# Patient Record
Sex: Female | Born: 1950 | Race: Asian | Hispanic: No | Marital: Married | Smoking: Never smoker
Health system: Southern US, Community
[De-identification: ages and names within clinical notes are randomized; demographics above are authoritative.]

## PROBLEM LIST (undated history)

## (undated) DIAGNOSIS — M75100 Unspecified rotator cuff tear or rupture of unspecified shoulder, not specified as traumatic: Secondary | ICD-10-CM

## (undated) DIAGNOSIS — D721 Eosinophilia: Principal | ICD-10-CM

## (undated) DIAGNOSIS — I5032 Chronic diastolic (congestive) heart failure: Secondary | ICD-10-CM

## (undated) DIAGNOSIS — Z9289 Personal history of other medical treatment: Secondary | ICD-10-CM

## (undated) DIAGNOSIS — I482 Chronic atrial fibrillation, unspecified: Secondary | ICD-10-CM

## (undated) DIAGNOSIS — E669 Obesity, unspecified: Secondary | ICD-10-CM

## (undated) DIAGNOSIS — D649 Anemia, unspecified: Secondary | ICD-10-CM

## (undated) DIAGNOSIS — K644 Residual hemorrhoidal skin tags: Secondary | ICD-10-CM

## (undated) DIAGNOSIS — R5383 Other fatigue: Secondary | ICD-10-CM

## (undated) DIAGNOSIS — I099 Rheumatic heart disease, unspecified: Secondary | ICD-10-CM

## (undated) DIAGNOSIS — R7303 Prediabetes: Secondary | ICD-10-CM

## (undated) DIAGNOSIS — D582 Other hemoglobinopathies: Secondary | ICD-10-CM

## (undated) DIAGNOSIS — J309 Allergic rhinitis, unspecified: Secondary | ICD-10-CM

## (undated) DIAGNOSIS — D560 Alpha thalassemia: Secondary | ICD-10-CM

## (undated) DIAGNOSIS — E785 Hyperlipidemia, unspecified: Secondary | ICD-10-CM

## (undated) DIAGNOSIS — I10 Essential (primary) hypertension: Secondary | ICD-10-CM

## (undated) DIAGNOSIS — G5600 Carpal tunnel syndrome, unspecified upper limb: Secondary | ICD-10-CM

## (undated) HISTORY — DX: Other hemoglobinopathies: D58.2

## (undated) HISTORY — DX: Carpal tunnel syndrome, unspecified upper limb: G56.00

## (undated) HISTORY — DX: Chronic atrial fibrillation, unspecified: I48.20

## (undated) HISTORY — DX: Other fatigue: R53.83

## (undated) HISTORY — DX: Personal history of other medical treatment: Z92.89

## (undated) HISTORY — DX: Alpha thalassemia: D56.0

## (undated) HISTORY — DX: Eosinophilia: D72.1

## (undated) HISTORY — DX: Allergic rhinitis, unspecified: J30.9

## (undated) HISTORY — DX: Anemia, unspecified: D64.9

## (undated) HISTORY — DX: Essential (primary) hypertension: I10

## (undated) HISTORY — DX: Residual hemorrhoidal skin tags: K64.4

## (undated) HISTORY — DX: Obesity, unspecified: E66.9

## (undated) HISTORY — PX: OTHER SURGICAL HISTORY: SHX169

## (undated) HISTORY — DX: Hyperlipidemia, unspecified: E78.5

## (undated) HISTORY — DX: Unspecified rotator cuff tear or rupture of unspecified shoulder, not specified as traumatic: M75.100

## (undated) HISTORY — PX: CARDIAC CATHETERIZATION: SHX172

## (undated) HISTORY — DX: Chronic diastolic (congestive) heart failure: I50.32

## (undated) HISTORY — DX: Prediabetes: R73.03

## (undated) HISTORY — DX: Rheumatic heart disease, unspecified: I09.9

---

## 1998-05-16 ENCOUNTER — Emergency Department (HOSPITAL_COMMUNITY): Admission: EM | Admit: 1998-05-16 | Discharge: 1998-05-16 | Payer: Self-pay | Admitting: Emergency Medicine

## 1998-06-04 ENCOUNTER — Emergency Department (HOSPITAL_COMMUNITY): Admission: EM | Admit: 1998-06-04 | Discharge: 1998-06-04 | Payer: Self-pay | Admitting: Emergency Medicine

## 1998-08-17 ENCOUNTER — Ambulatory Visit (HOSPITAL_COMMUNITY): Admission: RE | Admit: 1998-08-17 | Discharge: 1998-08-17 | Payer: Self-pay | Admitting: Gastroenterology

## 1998-08-17 ENCOUNTER — Encounter: Payer: Self-pay | Admitting: Gastroenterology

## 1998-08-22 ENCOUNTER — Ambulatory Visit (HOSPITAL_COMMUNITY): Admission: RE | Admit: 1998-08-22 | Discharge: 1998-08-22 | Payer: Self-pay | Admitting: Gastroenterology

## 1998-08-22 ENCOUNTER — Encounter: Payer: Self-pay | Admitting: Gastroenterology

## 1998-08-29 ENCOUNTER — Ambulatory Visit (HOSPITAL_COMMUNITY): Admission: RE | Admit: 1998-08-29 | Discharge: 1998-08-29 | Payer: Self-pay | Admitting: Gastroenterology

## 1999-03-14 ENCOUNTER — Encounter: Payer: Self-pay | Admitting: Obstetrics and Gynecology

## 1999-03-14 ENCOUNTER — Ambulatory Visit (HOSPITAL_COMMUNITY): Admission: RE | Admit: 1999-03-14 | Discharge: 1999-03-14 | Payer: Self-pay | Admitting: Obstetrics and Gynecology

## 1999-03-16 ENCOUNTER — Encounter (INDEPENDENT_AMBULATORY_CARE_PROVIDER_SITE_OTHER): Payer: Self-pay

## 1999-03-16 ENCOUNTER — Other Ambulatory Visit: Admission: RE | Admit: 1999-03-16 | Discharge: 1999-03-16 | Payer: Self-pay | Admitting: Obstetrics and Gynecology

## 1999-04-10 ENCOUNTER — Encounter: Payer: Self-pay | Admitting: Endocrinology

## 1999-04-10 ENCOUNTER — Ambulatory Visit (HOSPITAL_COMMUNITY): Admission: RE | Admit: 1999-04-10 | Discharge: 1999-04-10 | Payer: Self-pay | Admitting: Endocrinology

## 1999-10-23 ENCOUNTER — Encounter: Payer: Self-pay | Admitting: Gastroenterology

## 1999-10-23 ENCOUNTER — Ambulatory Visit (HOSPITAL_COMMUNITY): Admission: RE | Admit: 1999-10-23 | Discharge: 1999-10-23 | Payer: Self-pay | Admitting: Gastroenterology

## 2000-04-01 ENCOUNTER — Encounter: Payer: Self-pay | Admitting: Cardiology

## 2000-04-01 ENCOUNTER — Encounter: Admission: RE | Admit: 2000-04-01 | Discharge: 2000-04-01 | Payer: Self-pay | Admitting: Cardiology

## 2000-11-25 ENCOUNTER — Other Ambulatory Visit: Admission: RE | Admit: 2000-11-25 | Discharge: 2000-11-25 | Payer: Self-pay | Admitting: Gynecology

## 2001-05-05 ENCOUNTER — Inpatient Hospital Stay (HOSPITAL_COMMUNITY): Admission: EM | Admit: 2001-05-05 | Discharge: 2001-05-09 | Payer: Self-pay | Admitting: Emergency Medicine

## 2001-05-05 ENCOUNTER — Encounter: Payer: Self-pay | Admitting: Cardiology

## 2001-05-06 ENCOUNTER — Encounter: Payer: Self-pay | Admitting: Cardiology

## 2001-05-07 ENCOUNTER — Encounter: Payer: Self-pay | Admitting: Cardiology

## 2001-05-08 ENCOUNTER — Encounter: Payer: Self-pay | Admitting: Cardiology

## 2001-05-09 ENCOUNTER — Encounter: Payer: Self-pay | Admitting: Cardiology

## 2001-10-02 ENCOUNTER — Encounter: Payer: Self-pay | Admitting: Internal Medicine

## 2001-10-02 ENCOUNTER — Emergency Department (HOSPITAL_COMMUNITY): Admission: EM | Admit: 2001-10-02 | Discharge: 2001-10-02 | Payer: Self-pay

## 2003-01-06 ENCOUNTER — Other Ambulatory Visit: Admission: RE | Admit: 2003-01-06 | Discharge: 2003-01-06 | Payer: Self-pay | Admitting: Gynecology

## 2003-10-25 ENCOUNTER — Emergency Department (HOSPITAL_COMMUNITY): Admission: EM | Admit: 2003-10-25 | Discharge: 2003-10-25 | Payer: Self-pay | Admitting: Emergency Medicine

## 2003-11-08 ENCOUNTER — Encounter: Admission: RE | Admit: 2003-11-08 | Discharge: 2003-11-08 | Payer: Self-pay | Admitting: Cardiology

## 2004-07-27 ENCOUNTER — Other Ambulatory Visit: Admission: RE | Admit: 2004-07-27 | Discharge: 2004-07-27 | Payer: Self-pay | Admitting: Gynecology

## 2004-10-19 ENCOUNTER — Observation Stay (HOSPITAL_COMMUNITY): Admission: EM | Admit: 2004-10-19 | Discharge: 2004-10-20 | Payer: Self-pay | Admitting: Emergency Medicine

## 2004-10-20 ENCOUNTER — Encounter (INDEPENDENT_AMBULATORY_CARE_PROVIDER_SITE_OTHER): Payer: Self-pay | Admitting: *Deleted

## 2004-12-01 ENCOUNTER — Encounter: Admission: RE | Admit: 2004-12-01 | Discharge: 2004-12-01 | Payer: Self-pay | Admitting: Cardiology

## 2004-12-05 ENCOUNTER — Ambulatory Visit (HOSPITAL_COMMUNITY): Admission: RE | Admit: 2004-12-05 | Discharge: 2004-12-05 | Payer: Self-pay | Admitting: Cardiology

## 2005-06-27 ENCOUNTER — Inpatient Hospital Stay (HOSPITAL_COMMUNITY): Admission: EM | Admit: 2005-06-27 | Discharge: 2005-06-29 | Payer: Self-pay | Admitting: Emergency Medicine

## 2006-01-28 ENCOUNTER — Emergency Department (HOSPITAL_COMMUNITY): Admission: EM | Admit: 2006-01-28 | Discharge: 2006-01-28 | Payer: Self-pay | Admitting: Emergency Medicine

## 2006-02-01 ENCOUNTER — Emergency Department (HOSPITAL_COMMUNITY): Admission: EM | Admit: 2006-02-01 | Discharge: 2006-02-01 | Payer: Self-pay | Admitting: Family Medicine

## 2006-02-11 ENCOUNTER — Encounter: Admission: RE | Admit: 2006-02-11 | Discharge: 2006-04-30 | Payer: Self-pay | Admitting: Orthopedic Surgery

## 2006-02-24 ENCOUNTER — Emergency Department (HOSPITAL_COMMUNITY): Admission: EM | Admit: 2006-02-24 | Discharge: 2006-02-24 | Payer: Self-pay | Admitting: Family Medicine

## 2006-05-15 ENCOUNTER — Encounter: Admission: RE | Admit: 2006-05-15 | Discharge: 2006-05-15 | Payer: Self-pay | Admitting: Orthopedic Surgery

## 2006-06-12 ENCOUNTER — Other Ambulatory Visit: Admission: RE | Admit: 2006-06-12 | Discharge: 2006-06-12 | Payer: Self-pay | Admitting: Gynecology

## 2006-06-23 ENCOUNTER — Encounter: Admission: RE | Admit: 2006-06-23 | Discharge: 2006-06-23 | Payer: Self-pay | Admitting: Orthopedic Surgery

## 2006-07-31 ENCOUNTER — Emergency Department (HOSPITAL_COMMUNITY): Admission: EM | Admit: 2006-07-31 | Discharge: 2006-07-31 | Payer: Self-pay | Admitting: Emergency Medicine

## 2007-07-29 ENCOUNTER — Encounter: Admission: RE | Admit: 2007-07-29 | Discharge: 2007-10-27 | Payer: Self-pay | Admitting: Orthopaedic Surgery

## 2008-01-07 ENCOUNTER — Encounter (INDEPENDENT_AMBULATORY_CARE_PROVIDER_SITE_OTHER): Payer: Self-pay | Admitting: Nurse Practitioner

## 2008-01-07 ENCOUNTER — Ambulatory Visit: Payer: Self-pay | Admitting: Internal Medicine

## 2008-01-08 ENCOUNTER — Ambulatory Visit: Payer: Self-pay | Admitting: *Deleted

## 2008-03-17 ENCOUNTER — Ambulatory Visit: Payer: Self-pay | Admitting: Internal Medicine

## 2008-08-24 ENCOUNTER — Encounter: Admission: RE | Admit: 2008-08-24 | Discharge: 2008-08-26 | Payer: Self-pay | Admitting: Orthopedic Surgery

## 2008-09-06 ENCOUNTER — Encounter: Admission: RE | Admit: 2008-09-06 | Discharge: 2008-10-26 | Payer: Self-pay | Admitting: Orthopedic Surgery

## 2008-11-08 ENCOUNTER — Encounter: Admission: RE | Admit: 2008-11-08 | Discharge: 2008-11-08 | Payer: Self-pay | Admitting: Orthopedic Surgery

## 2008-11-11 DIAGNOSIS — G5600 Carpal tunnel syndrome, unspecified upper limb: Secondary | ICD-10-CM

## 2008-12-29 ENCOUNTER — Telehealth (INDEPENDENT_AMBULATORY_CARE_PROVIDER_SITE_OTHER): Payer: Self-pay | Admitting: Internal Medicine

## 2009-01-07 ENCOUNTER — Ambulatory Visit: Payer: Self-pay | Admitting: Internal Medicine

## 2009-01-07 DIAGNOSIS — E785 Hyperlipidemia, unspecified: Secondary | ICD-10-CM

## 2009-01-07 DIAGNOSIS — S335XXA Sprain of ligaments of lumbar spine, initial encounter: Secondary | ICD-10-CM

## 2009-01-07 DIAGNOSIS — E1169 Type 2 diabetes mellitus with other specified complication: Secondary | ICD-10-CM | POA: Insufficient documentation

## 2009-01-07 DIAGNOSIS — K219 Gastro-esophageal reflux disease without esophagitis: Secondary | ICD-10-CM | POA: Insufficient documentation

## 2009-01-18 ENCOUNTER — Encounter (INDEPENDENT_AMBULATORY_CARE_PROVIDER_SITE_OTHER): Payer: Self-pay | Admitting: Internal Medicine

## 2009-06-21 ENCOUNTER — Ambulatory Visit: Payer: Self-pay | Admitting: Internal Medicine

## 2009-06-21 DIAGNOSIS — M719 Bursopathy, unspecified: Secondary | ICD-10-CM

## 2009-06-21 DIAGNOSIS — Z8639 Personal history of other endocrine, nutritional and metabolic disease: Secondary | ICD-10-CM

## 2009-06-21 DIAGNOSIS — Z862 Personal history of diseases of the blood and blood-forming organs and certain disorders involving the immune mechanism: Secondary | ICD-10-CM | POA: Insufficient documentation

## 2009-06-21 DIAGNOSIS — R7309 Other abnormal glucose: Secondary | ICD-10-CM | POA: Insufficient documentation

## 2009-06-21 DIAGNOSIS — M67919 Unspecified disorder of synovium and tendon, unspecified shoulder: Secondary | ICD-10-CM | POA: Insufficient documentation

## 2009-06-22 DIAGNOSIS — R17 Unspecified jaundice: Secondary | ICD-10-CM | POA: Insufficient documentation

## 2009-06-22 LAB — CONVERTED CEMR LAB
ALT: 23 units/L (ref 0–35)
AST: 23 units/L (ref 0–37)
Albumin: 4.8 g/dL (ref 3.5–5.2)
Alkaline Phosphatase: 73 units/L (ref 39–117)
BUN: 19 mg/dL (ref 6–23)
CO2: 25 meq/L (ref 19–32)
Calcium: 9.3 mg/dL (ref 8.4–10.5)
Chloride: 103 meq/L (ref 96–112)
Glucose, Bld: 98 mg/dL (ref 70–99)
Potassium: 3.9 meq/L (ref 3.5–5.3)
Sodium: 142 meq/L (ref 135–145)
Total Protein: 8.2 g/dL (ref 6.0–8.3)

## 2009-06-28 ENCOUNTER — Ambulatory Visit: Payer: Self-pay | Admitting: Internal Medicine

## 2009-06-29 ENCOUNTER — Encounter (INDEPENDENT_AMBULATORY_CARE_PROVIDER_SITE_OTHER): Payer: Self-pay | Admitting: Internal Medicine

## 2009-07-01 ENCOUNTER — Encounter (INDEPENDENT_AMBULATORY_CARE_PROVIDER_SITE_OTHER): Payer: Self-pay | Admitting: Internal Medicine

## 2009-07-07 ENCOUNTER — Encounter (INDEPENDENT_AMBULATORY_CARE_PROVIDER_SITE_OTHER): Payer: Self-pay | Admitting: Internal Medicine

## 2009-08-03 ENCOUNTER — Ambulatory Visit: Payer: Self-pay | Admitting: Internal Medicine

## 2009-08-15 LAB — CONVERTED CEMR LAB
Bilirubin, Direct: 0.3 mg/dL (ref 0.0–0.3)
Indirect Bilirubin: 1.4 mg/dL — ABNORMAL HIGH (ref 0.0–0.9)
Total Bilirubin: 1.7 mg/dL — ABNORMAL HIGH (ref 0.3–1.2)

## 2009-10-06 ENCOUNTER — Encounter: Admission: RE | Admit: 2009-10-06 | Discharge: 2010-01-04 | Payer: Self-pay | Admitting: Internal Medicine

## 2009-10-07 ENCOUNTER — Encounter: Admission: RE | Admit: 2009-10-07 | Discharge: 2009-10-07 | Payer: Self-pay | Admitting: Cardiology

## 2009-10-07 ENCOUNTER — Encounter (INDEPENDENT_AMBULATORY_CARE_PROVIDER_SITE_OTHER): Payer: Self-pay | Admitting: Internal Medicine

## 2009-11-22 ENCOUNTER — Encounter (INDEPENDENT_AMBULATORY_CARE_PROVIDER_SITE_OTHER): Payer: Self-pay | Admitting: Internal Medicine

## 2009-11-29 ENCOUNTER — Ambulatory Visit: Payer: Self-pay | Admitting: Internal Medicine

## 2009-11-29 DIAGNOSIS — I079 Rheumatic tricuspid valve disease, unspecified: Secondary | ICD-10-CM | POA: Insufficient documentation

## 2009-11-29 DIAGNOSIS — M109 Gout, unspecified: Secondary | ICD-10-CM

## 2009-12-09 DIAGNOSIS — R718 Other abnormality of red blood cells: Secondary | ICD-10-CM

## 2009-12-09 LAB — CONVERTED CEMR LAB
ALT: 25 units/L (ref 0–35)
AST: 26 units/L (ref 0–37)
Albumin: 5.2 g/dL (ref 3.5–5.2)
Alkaline Phosphatase: 66 units/L (ref 39–117)
BUN: 21 mg/dL (ref 6–23)
Basophils Absolute: 0.1 10*3/uL (ref 0.0–0.1)
Basophils Relative: 1 % (ref 0–1)
CO2: 24 meq/L (ref 19–32)
Calcium: 9.5 mg/dL (ref 8.4–10.5)
Chloride: 103 meq/L (ref 96–112)
Cholesterol: 166 mg/dL (ref 0–200)
Creatinine, Ser: 0.84 mg/dL (ref 0.40–1.20)
Eosinophils Absolute: 0.2 10*3/uL (ref 0.0–0.7)
Eosinophils Relative: 3 % (ref 0–5)
Glucose, Bld: 100 mg/dL — ABNORMAL HIGH (ref 70–99)
HCT: 39.5 % (ref 36.0–46.0)
HDL: 63 mg/dL (ref >39)
Hemoglobin: 12.3 g/dL (ref 12.0–15.0)
LDL Cholesterol: 80 mg/dL (ref 0–99)
LH: 48.1 milliintl units/mL
Lymphocytes Relative: 39 % (ref 12–46)
Lymphs Abs: 2.6 10*3/uL (ref 0.7–4.0)
MCHC: 31.1 g/dL (ref 30.0–36.0)
MCV: 75.2 fL — ABNORMAL LOW (ref 78.0–100.0)
Monocytes Absolute: 0.5 10*3/uL (ref 0.1–1.0)
Monocytes Relative: 7 % (ref 3–12)
Neutro Abs: 3.4 10*3/uL (ref 1.7–7.7)
Neutrophils Relative %: 51 % (ref 43–77)
Platelets: 161 10*3/uL (ref 150–400)
Potassium: 3.5 meq/L (ref 3.5–5.3)
RBC: 5.25 M/uL — ABNORMAL HIGH (ref 3.87–5.11)
RDW: 14.7 % (ref 11.5–15.5)
Sodium: 142 meq/L (ref 135–145)
Total Bilirubin: 3 mg/dL — ABNORMAL HIGH (ref 0.3–1.2)
Total CHOL/HDL Ratio: 2.6
Total Protein: 8.3 g/dL (ref 6.0–8.3)
Triglycerides: 113 mg/dL (ref <150)
VLDL: 23 mg/dL (ref 0–40)
WBC: 6.6 10*3/uL (ref 4.0–10.5)

## 2009-12-14 ENCOUNTER — Ambulatory Visit: Payer: Self-pay | Admitting: Internal Medicine

## 2010-01-26 ENCOUNTER — Ambulatory Visit: Payer: Self-pay | Admitting: Internal Medicine

## 2010-02-15 LAB — CONVERTED CEMR LAB: Retic Ct Pct: 2.5 % (ref 0.4–3.1)

## 2010-03-04 ENCOUNTER — Encounter (INDEPENDENT_AMBULATORY_CARE_PROVIDER_SITE_OTHER): Payer: Self-pay | Admitting: Internal Medicine

## 2010-03-08 ENCOUNTER — Telehealth (INDEPENDENT_AMBULATORY_CARE_PROVIDER_SITE_OTHER): Payer: Self-pay | Admitting: Internal Medicine

## 2010-03-22 ENCOUNTER — Telehealth (INDEPENDENT_AMBULATORY_CARE_PROVIDER_SITE_OTHER): Payer: Self-pay | Admitting: Internal Medicine

## 2010-03-22 ENCOUNTER — Encounter (INDEPENDENT_AMBULATORY_CARE_PROVIDER_SITE_OTHER): Payer: Self-pay | Admitting: Internal Medicine

## 2010-04-04 ENCOUNTER — Ambulatory Visit: Payer: Self-pay | Admitting: Internal Medicine

## 2010-04-04 LAB — CONVERTED CEMR LAB
Blood Glucose, AC Bkfst: 111 mg/dL
Hgb A1c MFr Bld: 5.7 %
Saturation Ratios: 39 % (ref 20–55)
TIBC: 324 ug/dL (ref 250–470)
UIBC: 199 ug/dL

## 2010-04-05 ENCOUNTER — Ambulatory Visit: Payer: Self-pay | Admitting: Oncology

## 2010-04-05 ENCOUNTER — Ambulatory Visit: Payer: Self-pay | Admitting: Cardiology

## 2010-04-05 ENCOUNTER — Encounter (INDEPENDENT_AMBULATORY_CARE_PROVIDER_SITE_OTHER): Payer: Self-pay | Admitting: Internal Medicine

## 2010-04-20 ENCOUNTER — Ambulatory Visit: Payer: Self-pay | Admitting: Cardiology

## 2010-04-26 ENCOUNTER — Encounter (INDEPENDENT_AMBULATORY_CARE_PROVIDER_SITE_OTHER): Payer: Self-pay | Admitting: Internal Medicine

## 2010-04-26 LAB — COMPREHENSIVE METABOLIC PANEL
Albumin: 4.2 g/dL (ref 3.5–5.2)
Alkaline Phosphatase: 76 U/L (ref 39–117)
BUN: 17 mg/dL (ref 6–23)
Creatinine, Ser: 1.01 mg/dL (ref 0.40–1.20)
Glucose, Bld: 94 mg/dL (ref 70–99)
Potassium: 3.1 mEq/L — ABNORMAL LOW (ref 3.5–5.3)

## 2010-04-26 LAB — CBC & DIFF AND RETIC
BASO%: 0.5 % (ref 0.0–2.0)
EOS%: 1.7 % (ref 0.0–7.0)
Eosinophils Absolute: 0.1 10*3/uL (ref 0.0–0.5)
LYMPH%: 43.7 % (ref 14.0–49.7)
MCH: 23.6 pg — ABNORMAL LOW (ref 25.1–34.0)
MCHC: 32.2 g/dL (ref 31.5–36.0)
MCV: 73.2 fL — ABNORMAL LOW (ref 79.5–101.0)
MONO%: 8.7 % (ref 0.0–14.0)
Platelets: 147 10*3/uL (ref 145–400)
RBC: 4.67 10*6/uL (ref 3.70–5.45)
RDW: 14.7 % — ABNORMAL HIGH (ref 11.2–14.5)
Retic %: 2.25 % — ABNORMAL HIGH (ref 0.50–1.50)

## 2010-04-26 LAB — MORPHOLOGY

## 2010-04-26 LAB — HAPTOGLOBIN: Haptoglobin: <6 mg/dL — ABNORMAL LOW (ref 16–200)

## 2010-05-18 ENCOUNTER — Telehealth (INDEPENDENT_AMBULATORY_CARE_PROVIDER_SITE_OTHER): Payer: Self-pay | Admitting: Internal Medicine

## 2010-05-22 ENCOUNTER — Ambulatory Visit: Payer: Self-pay | Admitting: Cardiology

## 2010-06-19 ENCOUNTER — Ambulatory Visit: Payer: Self-pay | Admitting: Cardiology

## 2010-07-03 ENCOUNTER — Ambulatory Visit: Payer: Self-pay | Admitting: Cardiology

## 2010-07-26 ENCOUNTER — Ambulatory Visit: Payer: Self-pay | Admitting: Cardiology

## 2010-08-04 ENCOUNTER — Other Ambulatory Visit
Admission: RE | Admit: 2010-08-04 | Discharge: 2010-08-04 | Payer: Self-pay | Source: Home / Self Care | Admitting: Internal Medicine

## 2010-08-04 ENCOUNTER — Telehealth (INDEPENDENT_AMBULATORY_CARE_PROVIDER_SITE_OTHER): Payer: Self-pay | Admitting: Internal Medicine

## 2010-08-04 ENCOUNTER — Ambulatory Visit: Payer: Self-pay | Admitting: Internal Medicine

## 2010-08-04 DIAGNOSIS — K644 Residual hemorrhoidal skin tags: Secondary | ICD-10-CM | POA: Insufficient documentation

## 2010-08-04 LAB — CONVERTED CEMR LAB
Bilirubin Urine: NEGATIVE
Blood Glucose, Fingerstick: 93
Glucose, Urine, Semiquant: NEGATIVE
KOH Prep: NEGATIVE
Ketones, urine, test strip: NEGATIVE
Nitrite: NEGATIVE
Pap Smear: NEGATIVE
Protein, U semiquant: NEGATIVE
Specific Gravity, Urine: 1.01
Urobilinogen, UA: 0.2
WBC Urine, dipstick: NEGATIVE
Whiff Test: NEGATIVE
pH: 5

## 2010-08-15 ENCOUNTER — Ambulatory Visit (HOSPITAL_COMMUNITY)
Admission: RE | Admit: 2010-08-15 | Discharge: 2010-08-15 | Payer: Self-pay | Source: Home / Self Care | Attending: Internal Medicine | Admitting: Internal Medicine

## 2010-08-22 ENCOUNTER — Encounter (INDEPENDENT_AMBULATORY_CARE_PROVIDER_SITE_OTHER): Payer: Self-pay | Admitting: Internal Medicine

## 2010-08-22 ENCOUNTER — Encounter: Admission: RE | Admit: 2010-08-22 | Payer: Self-pay | Source: Home / Self Care | Admitting: Internal Medicine

## 2010-08-24 ENCOUNTER — Ambulatory Visit: Payer: Self-pay | Admitting: Cardiology

## 2010-09-07 ENCOUNTER — Ambulatory Visit: Payer: Self-pay | Admitting: Cardiology

## 2010-09-25 ENCOUNTER — Ambulatory Visit: Payer: Self-pay | Admitting: Cardiology

## 2010-09-28 NOTE — Progress Notes (Signed)
Summary: Office Visit//DEPRESSION SCREENING  Office Visit//DEPRESSION SCREENING   Imported By: Arta Bruce 08/07/2010 09:20:29  _____________________________________________________________________  External Attachment:    Type:   Image     Comment:   External Document

## 2010-09-28 NOTE — Progress Notes (Signed)
Summary: GI REFERRAL  Phone Note Outgoing Call   Summary of Call: Nora--GI referral for screening colonoscopy--letter and order Initial call taken by: Julieanne Manson MD,  August 04, 2010 3:35 PM  Follow-up for Phone Call        SEND REFERRAL TO [facility] WAITING FOR AN APPT. Follow-up by: Cheryll Dessert,  August 04, 2010 7:50 PM

## 2010-09-28 NOTE — Letter (Signed)
Summary: REFERRAL/RADIOLOGY//APPT DATE & TIME  REFERRAL/RADIOLOGY//APPT DATE & TIME   Imported By: Arta Bruce 08/07/2010 15:40:03  _____________________________________________________________________  External Attachment:    Type:   Image     Comment:   External Document

## 2010-09-28 NOTE — Progress Notes (Signed)
Summary: PATIENT WANTS LAB RESULTS  Phone Note Call from Patient Call back at Home Phone [phone]   Reason for Call: Lab or Test Results Summary of Call: Deborah [name] PT. MS Kaffenberger WOULD LIKE HER LAB RESULTS. Initial call taken by: Deborah [name],  March 08, 2010 10:58 AM  Follow-up for Phone Call        Fwd to Dr. Delrae Alfred for test results Follow-up by: Deborah [name] CMA,  March 08, 2010 11:13 AM  Additional Follow-up for Phone Call Additional follow up Details #1::        Spoke with Dr. Donnie [name], Heme/Onc regarding pt.  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  He recommended following retic count and CBC every 6 months.  Also to have pt. take Folic Acid 1 mg daily.  For some reason, did not have iron studies done--though thought we did do those He also recommended referral to Hematology so they are known in their system should a problem arise.  Called and left message that we will try and call her first thing tomorrow morning.  Deborah [name]--please schedule pt. for iron studies and ferritin. Let her know we are referring to Hematology Sending Rx for Folic Acid to pharmacy [facility] well[facility] [address] Additional Follow-up by: Deborah Manson MD,  March 22, 2010 3:04 PM    Additional Follow-up for Phone Call Additional follow up Details #2::    Called again this morning, phone picked up and hear background noise, but no one answers.  Tried to call back x2, but line now busy. Please try and call pt. and let her know above information.  Additional Follow-up for Phone Call Additional follow up Details #3:: Details for Additional Follow-up Action Taken: PT notified and has appt on 04/04/10 already, per Dr. Delrae Alfred ok to have labs drawn at that appt. Additional Follow-up by:  Deborah [name] CMA,  March 23, 2010 9:50 AM  New/Updated Medications: FOLIC ACID 1 MG TABS (FOLIC ACID) 1 tab by mouth daily Prescriptions: FOLIC ACID 1 MG TABS (FOLIC ACID) 1 tab by mouth daily  #30 x 11   Entered and Authorized by:   Deborah Manson MD   Signed by:   Deborah Manson MD on 03/22/2010   Method used:   [facility]  [address] [phone]* (retail)       [address]       [place], [place]  [id number]       Ph: [phone] or [phone]       Fax: [phone]   RxID:   [id number]

## 2010-09-28 NOTE — Assessment & Plan Note (Signed)
Summary: FU FROM DECEMBER//KT   Vital Signs:  Patient profile:   60 year old female Weight:      156.56 pounds BMI:     30.69 Temp:     97.9 degrees F Pulse rate:   75 / minute Pulse rhythm:   regular Resp:     20 per minute BP sitting:   106 / 70  (left arm) Cuff size:   regular  Vitals Entered By: Chauncy Passy, SMA CC: Pt. is here for a f/u on labs.  Is Patient Diabetic? No Pain Assessment Patient in pain? yes     Location: neck Intensity: 7 Type: aching Onset of pain  Constant  Does patient need assistance? Functional Status Self care Ambulation Normal   CC:  Pt. is here for a f/u on labs. .  History of Present Illness: 1.  Hyperglycemia:  Did go to Nutrition.  Has changed her diet and thought she had lost weight.  Feels she knows what she needs to do.  States sugars were better after she had made changes.  Fasting blood sugars still running in low 100s.  Recent A1C was 5.4%-a bit improved from previous.  2.  Indirect Hyperbilirubinemia:  No elevation in transaminases.  Have not checked a CBC to see if any concern for hemolysis.  3.  Left eye with pruritis and sense of foreign body.  Has been seen by [name].  Was given unknown eye drop that helped.  Pt. seems to feel this is the Alrex.  Was also on Systane.  4.  Elevated Uric Acid:  from Deborah [name] office.  Ordered when pt. had a painful left great MTP joint.  Level was 8.5.  Pt. was on Allopurinol for a while and Colchicine as needed, but stopped when the pain resolved.  Does not want to take the medication as she has only had one episode of the pain.  Denies  a red or swollen joint--just had pain.  Initially, states she did not want to take, but later changed her mind.  Discussed the Allopurinol can actually make the condition worse if wait until her pain recurs.  5.  DDD, cervical spine and right rotator cuff syndrome:  See previous notes--looking for Deborah [name] records.  Still causing pt.  discomfort.  Current Medications (verified): 1)  Omeprazole 20 Mg Tbec (Omeprazole) .Marland Kitchen.. 1 Tab By Mouth Daily 2)  Systane Ultra 0.4-0.3 % Soln (Polyethyl Glycol-Propyl Glycol) .... Two Drops in Both Eyes 3)  Alrex 0.2 % Susp (Loteprednol Etabonate) .... One Drop in Left Eye Twice A Day For Two Weeks 4)  Crestor 5 Mg Tabs (Rosuvastatin Calcium) .Marland Kitchen.. 1 Tab By Mouth Every Other Day 5)  Klor-Con M20 20 Meq Cr-Tabs (Potassium Chloride Crys Cr) .... 2 Tabs By Mouth Daily 6)  Furosemide 40 Mg Tabs (Furosemide) .Marland Kitchen.. 1 Tab By Mouth Two Times A Day 7)  Coumadin 2 Mg Tabs (Warfarin Sodium) .Marland Kitchen.. 1 Tab By Mouth Daily 8)  Toprol Xl 100 Mg Xr24h-Tab (Metoprolol Succinate) .Marland Kitchen.. 1 Tab By Mouth Daily 9)  Adult Aspirin Low Strength 81 Mg Tbdp (Aspirin) .Marland Kitchen.. 1 Tab By Mouth Daily  Allergies (verified): 1)  ! Phenergan  Physical Exam  General:  NAD Eyes:  Conjunctivae without icterus. Lungs:  Normal respiratory effort, chest expands symmetrically. Lungs are clear to auscultation, no crackles or wheezes. Heart:  Irreg, irreg.  Crisp mechanical valve. Abdomen:  Bowel sounds positive,abdomen soft and non-tender without masses, organomegaly or hernias noted. Msk:  Full ROM of right shoulder and neck--uncomfortable.   Impression & Recommendations:  Problem # 1:  HYPERBILIRUBINEMIA (ICD-782.4) Recheck labs--did have an AST of 48 with Dr. Deborah Chalk last year, also with elevated protein, but check in October only showed elevated indirect bilirubin.  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   Check CBC and LDH to get a better idea of any ongoing low level hemolysis. Orders: T-Comprehensive Metabolic Panel [id number]) T-CBC w/Diff ([id number]) T-LH ([id number])  Problem # 2:  GOUT (ICD-274.9) Pt. will start back on Allopurinol Her updated medication list for this problem includes:    Allopurinol 300 Mg Tabs (Allopurinol) .Marland Kitchen... 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 does  not want to consider injection of shoulder or Cspine at this time--to use Tylenol as needed and continue home exercises.  Problem # 4:  ROTATOR CUFF SYNDROME, RIGHT (ICD-726.10) As above  Problem # 5:  HYPERGLYCEMIA (ICD-790.29) Working on this--A1C was down to 5.4 % with lifestyle changes. Orders: T-Comprehensive Metabolic Panel ([id number])  Complete Medication List: 1)  Omeprazole 20 Mg Tbec (Omeprazole) .Marland Kitchen.. 1 tab by mouth daily 2)  Systane Ultra 0.4-0.3 % Soln (Polyethyl glycol-propyl glycol) .... Two drops in both eyes 3)  Alrex 0.2 % Susp (Loteprednol etabonate) .... One drop in left eye twice a day for two weeks 4)  Crestor 5 Mg Tabs (Rosuvastatin calcium) .Marland Kitchen.. 1 tab by mouth every other day 5)  Klor-con M20 20 Meq Cr-tabs (Potassium chloride crys cr) .... 2 tabs by mouth daily 6)  Furosemide 40 Mg Tabs (Furosemide) .Marland Kitchen.. 1 tab by mouth two times a day 7)  Coumadin 2 Mg Tabs (Warfarin sodium) .Marland Kitchen.. 1 tab by mouth daily 8)  Toprol Xl 100 Mg Xr24h-tab (Metoprolol succinate) .Marland Kitchen.. 1 tab by mouth daily 9)  Adult Aspirin Low Strength 81 Mg Tbdp (Aspirin) .Marland Kitchen.. 1 tab by mouth daily 10)  Allopurinol 300 Mg Tabs (Allopurinol) .Marland Kitchen.. 1 tab by mouth daily  Other Orders: T-Lipid Profile ([id number])  Patient Instructions: 1)  Call eye doctor to get eye drops refilled. 2)  Follow up with Deborah [name] in 4 months for CPP Prescriptions: ALLOPURINOL 300 MG TABS (ALLOPURINOL) 1 tab by mouth daily  #30 x 11   Entered and Authorized by:   Deborah [name]   Signed by:   Deborah [name] on 11/29/2009   Method used:   [facility]ically to        [name] [address] [phone]* (retail)       [address]       [place], [place]  [id number]       Ph: [phone] or [phone]       Fax: [phone]   RxID:   [id number]   Appended Document: FU FROM DECEMBER//KT Goes to Deborah [name] yearly for gynecologic care.

## 2010-09-28 NOTE — Letter (Signed)
Summary: nutition and diabetes  nutition and diabetes   Imported By: Arta Bruce 10/13/2009 14:18:24  _____________________________________________________________________  External Attachment:    Type:   Image     Comment:   External Document

## 2010-09-28 NOTE — Letter (Signed)
Summary: *Referral Letter  [facility]  [address] [place], [place] [address]   Phone: [phone]  Fax: [phone]    03/22/2010  Thank you in advance for agreeing to see my patient:  Deborah [name] [address] [place], [place]  [address]  Phone: [phone]  Reason for Referral: 60 yo [place] female noted to have indirect hyperbilirubinemia on screening labs with elevated RBC count in relation to Hgb/HCT, microcytosis.  Follow up labs showed abnormal hemoglobin electrophoresis:  Hemoglobin E and Hemoglobin Constant Spring, apparently frequently seen in [place].  Retic count in normal range, LDH high normal and Haptoglobin less than 0.6.  Coombs testing negative.  I have not noted hepatosplenomegaly.  No history of cholelithiasis.  Complicating her issues:  she has a mechanical mitral valve and is on chronic anticoagulation with Coumadin for this.  I did speak to Dr. Colin Ina starting her on folic acid 1 mg daily and will follow her retic count and CBC every 6 months.  For some reason, missed getting iron studies and she is coming in for that as well.  Would like to have her known to Hematology in case any problems arise regarding these findings/low grade hemolysis.  Procedures Requested: Evaluation and recommendations for follow up  Current Medical Problems:   Current Medications: 1)  OMEPRAZOLE 20 MG TBEC (OMEPRAZOLE) 1 tab by mouth daily 2)  SYSTANE ULTRA 0.4-0.3 % SOLN (POLYETHYL GLYCOL-PROPYL GLYCOL) two drops in both eyes 3)  ALREX 0.2 % SUSP (LOTEPREDNOL ETABONATE) one drop in left eye twice a day for two weeks 4)  CRESTOR 5 MG TABS (ROSUVASTATIN CALCIUM) 1 tab by mouth every other day 5)  KLOR-CON M20 20 MEQ CR-TABS (POTASSIUM CHLORIDE CRYS CR) 2 tabs by mouth daily 6)  FUROSEMIDE 40 MG TABS (FUROSEMIDE) 1 tab by mouth two times a day 7)  COUMADIN 2 MG TABS (WARFARIN SODIUM) 1 tab by mouth daily 8)  TOPROL XL 100 MG XR24H-TAB (METOPROLOL  SUCCINATE) 1 tab by mouth daily 9)  ADULT ASPIRIN LOW STRENGTH 81 MG TBDP (ASPIRIN) 1 tab by mouth daily 10)  ALLOPURINOL 300 MG TABS (ALLOPURINOL) 1 tab by mouth daily 11)  FOLIC ACID 1 MG TABS (FOLIC ACID) 1 tab by mouth daily   Past Medical History: 1)  HYPERLIPIDEMIA (ICD-272.4) 2)  RHEUMATIC FEVER, HX OF (ICD-V12.59) 3)  MECHANICAL MITRAL VALVE REPLACEMENT, 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  Sincerely,  Julieanne Manson MD

## 2010-09-28 NOTE — Assessment & Plan Note (Signed)
Summary: follow up with Dr Delrae Alfred in 4 months hyperglyseme/ liver//gk   Vital Signs:  Patient profile:   60 year old female Weight:      153 pounds Temp:     98.0 degrees F Pulse rate:   55 / minute Pulse rhythm:   regular Resp:     18 per minute BP sitting:   110 / 80  (left arm) Cuff size:   regular  Vitals Entered By: Vesta Mixer CMA (April 04, 2010 8:58 AM) CC: 4 month f/u pt is fasting Is Patient Diabetic? No  Does patient need assistance? Ambulation Normal   CC:  4 month f/u pt is fasting.  History of Present Illness: 1.  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  Pt. with elevated indirect hyperbilirubinemia and low haptoglobin, normal LDH, negative Coombs, normal retic count, RDW in normal range, microcytosis.  Also with mechanical valve and on Coumadin (PT followed at cardiology).  Spoke with Dr. Donnie Coffin, who recommended she get in there system should she develop more profound hemolysis.  Based on above labs, felt to have low grade hemolysis.  Have not checked iron studies yet.  2.  Hyperglycemia:  Has been working with nutrition and has changed diet and is walking 3-4 times weekly up to 40 minutes.  No plan B for inclement weather.    Allergies (verified): 1)  ! Phenergan  Physical Exam  Lungs:  Normal respiratory effort, chest expands symmetrically. Lungs are clear to auscultation, no crackles or wheezes. Heart:  Crisp valvular noise Abdomen:  Bowel sounds positive,abdomen soft and non-tender without masses, organomegaly or hernias noted.   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 1 tab by mouth daily 2)  Systane Ultra 0.4-0.3 % Soln (Polyethyl glycol-propyl glycol) .... Two drops in both eyes 3)  Alrex 0.2 % Susp (Loteprednol etabonate) .... One drop in left eye twice a day for two weeks 4)  Crestor 5 Mg Tabs (Rosuvastatin calcium) .Marland Kitchen.. 1 tab by mouth every other day 5)  Klor-con M20 20 Meq Cr-tabs (Potassium chloride crys cr) .... 2 tabs by mouth daily 6)  Furosemide 40 Mg Tabs (Furosemide) .Marland Kitchen.. 1 tab by mouth two times a day 7)  Coumadin 2 Mg Tabs (Warfarin sodium) .Marland Kitchen.. 1 tab by mouth daily 8)  Toprol Xl 100 Mg Xr24h-tab (Metoprolol succinate) .Marland Kitchen.. 1 tab by mouth daily 9)  Adult Aspirin Low Strength 81 Mg Tbdp (Aspirin) .Marland Kitchen.. 1 tab by mouth daily 10)  Allopurinol 300 Mg Tabs (Allopurinol) .Marland Kitchen.. 1 tab by mouth daily 11)  Folic Acid 1 Mg Tabs (Folic acid) .Marland Kitchen.. 1 tab by mouth daily  Other Orders: T-Iron [id number]) T-Iron Binding Capacity (TIBC) ([id number]) T-Ferritin ([phone])  Patient Instructions: 1)  Your sugar today was 111 with A1C of 5.7%--still a bit high--keep working on diet and exercise. 2)  Follow up with Dr. Delrae Alfred in 6 months --fasting to recheck sugar  Laboratory Results   Blood Tests     HGBA1C: 5.7%   (Normal Range:  Non-Diabetic - 3-6%   Control Diabetic - 6-8%) CBG Fasting:: 111mg /dL

## 2010-09-28 NOTE — Letter (Signed)
Summary: *Referral Letter  [facility]  [address] [place], [place] [address]   Phone: [phone]  Fax: [phone]    08/04/2010  Thank you in advance for agreeing to see my patient:  Deborah [name] [address] [place], [place]  [address]  Phone: [phone]  Reason for Referral: Screening colonoscopy.  Guaiac cards pending.  Heme negative in office.  No hx of anemia.  Pt. on Coumadin for afib and mitral valve replacement.  Cardiologist is Dr. 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4) 20)  RHEUMATIC FEVER, HX OF (ICD-V12.59) 21)  MECHANICAL MITRAL VALVE REPLACEMENT, HX OF (ICD-V15.1)   Current Medications: 1)  OMEPRAZOLE 20 MG TBEC (OMEPRAZOLE) 1 tab by mouth daily 2)  SYSTANE ULTRA 0.4-0.3 % SOLN (POLYETHYL GLYCOL-PROPYL GLYCOL) two drops in both eyes 3)  CRESTOR 5 MG TABS (ROSUVASTATIN CALCIUM) 1 tab by mouth every other day 4)  KLOR-CON M20 20 MEQ CR-TABS (POTASSIUM CHLORIDE CRYS CR) 2 tabs by mouth daily 5)  FUROSEMIDE 40 MG TABS (FUROSEMIDE) 1 tab by mouth two times a day 6)   COUMADIN 2 MG TABS (WARFARIN SODIUM) 1 tab by mouth daily 1/2 tab only on Mondays--Dr. 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) 7)  AORTIC  SCLEROSIS WITH INSUFFICIENCY, MODERATE (ICD-424.1) 8)  FIBRILLATION, ATRIAL (ICD-427.31) 9)  GOUT (ICD-274.9) 10)  HYPERBILIRUBINEMIA (ICD-782.4) 11)  ROTATOR CUFF SYNDROME, RIGHT (ICD-726.10) 12)  HYPERGLYCEMIA (ICD-790.29) 13)  LIVER FUNCTION TESTS, ABNORMAL, HX OF (ICD-V12.2) 14)  BACK STRAIN, LUMBAR (ICD-847.2) 15)  GERD (ICD-530.81) 16)  HYPERLIPIDEMIA (ICD-272.4) 17)  RHEUMATIC FEVER, HX OF (ICD-V12.59) 18)  MECHANICAL MITRAL VALVE REPLACEMENT, 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  Sincerely,  Julieanne Manson MD

## 2010-09-28 NOTE — Letter (Signed)
Summary: ORDER NUTRITION & DIABETES  ORDER NUTRITION & DIABETES   Imported By: Arta Bruce 10/04/2009 12:52:08  _____________________________________________________________________  External Attachment:    Type:   Image     Comment:   External Document

## 2010-09-28 NOTE — Assessment & Plan Note (Signed)
Summary: cpp...cm   Vital Signs:  Patient profile:   60 year old female Menstrual status:  postmenopausal Weight:      157.06 pounds Temp:     98.3 degrees F oral Pulse rate:   70 / minute Pulse rhythm:   irregular Resp:     20 per minute BP sitting:   100 / 64  (left arm) Cuff size:   regular  Vitals Entered By: Hale Drone CMA (August 04, 2010 11:13 AM) CC: 60 y/o CPP. Concerned about Cholest. and DM.  Pain Assessment Patient in pain? no      CBG Result 93 CBG Device ID B non fasting  Does patient need assistance? Functional Status Self care Ambulation Normal  Vision Screening:Left eye with correction: 20 / 20-2 Right eye with correction: 20 / 25-2 Both eyes with correction: 20 / 20-2        Vision Entered By: Hale Drone CMA (August 04, 2010 11:24 AM)     Menstrual Status postmenopausal   CC:  60 y/o CPP. Concerned about Cholest. and DM. Marland Kitchen  History of Present Illness: 60 yo female here for CPP.  Concerns:    1.  Right neck and shoulder pain:  has to take something every night.  Pt. with cervical sponylosis.  We attempted to set her up with PT over 1 1/2 years ago, but pt. states PT never received our order, though was sent twice.  2.  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  3.  Tachypalpations:  rare--generally when lies down at night--lasts just seconds.  Does go to Dr. Deborah Chalk.  His office is following her protimes.    Current Medications (verified): 1)  Omeprazole 20 Mg Tbec (Omeprazole) .Marland Kitchen.. 1 Tab By Mouth Daily 2)  Systane Ultra 0.4-0.3 % Soln (Polyethyl Glycol-Propyl Glycol) .... Two Drops in Both Eyes 3)  Alrex 0.2 % Susp (Loteprednol Etabonate) .... One Drop in Left Eye Twice A Day For Two Weeks 4)  Crestor 5 Mg Tabs (Rosuvastatin Calcium) .Marland Kitchen.. 1 Tab By Mouth Every Other Day 5)  Klor-Con M20 20 Meq Cr-Tabs (Potassium Chloride Crys Cr) .... 2 Tabs By Mouth Daily 6)  Furosemide 40 Mg Tabs  (Furosemide) .Marland Kitchen.. 1 Tab By Mouth Two Times A Day 7)  Coumadin 2 Mg Tabs (Warfarin Sodium) .Marland Kitchen.. 1 Tab By Mouth Daily 8)  Toprol Xl 100 Mg Xr24h-Tab (Metoprolol Succinate) .Marland Kitchen.. 1 Tab By Mouth Daily 9)  Adult Aspirin Low Strength 81 Mg Tbdp (Aspirin) .Marland Kitchen.. 1 Tab By Mouth Daily 10)  Allopurinol 300 Mg Tabs (Allopurinol) .Marland Kitchen.. 1 Tab By Mouth Daily 11)  Folic Acid 1 Mg Tabs (Folic Acid) .Marland Kitchen.. 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 1.  2002:  Mechanical Mitral Valve Replacement.  Family History: Mother, 61:  DM, Stroke, CHF, pacemaker, bedridden. Father, died age 69:  heart problems, arthritis 9  siblings:  Brother died age 26 with cardiac problems;  Sister with valvular problems.  Brother with DM, gout, htn.   4 children:  33-40:  Healthy  Social History: [place] Pt. is a [place]. citizen [place] Much of family in the [place] 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  Review of Systems General:  Energy good at times, other times not.Marland Kitchen GI:  Denies  bloody stools and dark tarry stools. GU:  Denies dysuria and urinary frequency. Psych:  Sometimes depressed.  Scored 11 on PhQ 9.  Willing to see a counselor to further evaluate.Marland Kitchen  Physical Exam  General:  Well-developed,well-nourished,in no acute distress; alert,appropriate and cooperative throughout examination Head:  Normocephalic and atraumatic without obvious abnormalities. No apparent alopecia or balding. Eyes:  No corneal or conjunctival inflammation noted. EOMI. Perrla. Funduscopic exam benign, without hemorrhages, exudates or papilledema. Vision grossly normal. Ears:  External ear exam shows no significant lesions or deformities.  Otoscopic examination reveals clear canals, tympanic membranes are intact bilaterally without bulging, retraction, inflammation or discharge. Hearing is grossly normal bilaterally. Nose:  External nasal examination shows no deformity or inflammation. Nasal mucosa are pink and moist without lesions or exudates. Mouth:  Oral mucosa and oropharynx without lesions or exudates.  Teeth in good repair. Neck:  No deformities, masses, or tenderness noted. Breasts:  No mass, nodules, thickening, tenderness, bulging, retraction, inflamation, nipple discharge or skin changes noted.   Lungs:  Normal respiratory effort, chest expands symmetrically. Lungs are clear to auscultation, no crackles or wheezes. Heart:  Irreg, irreg rhythm with crisp systolic valve sound.  No obvious murmur today. Abdomen:  Bowel sounds positive,abdomen soft and non-tender without masses, organomegaly or hernias noted. Rectal:  External hemorrhoids without acute inflammation noted. Normal sphincter tone. No rectal masses or tenderness. Genitalia:  Pelvic Exam:        External: normal female genitalia without lesions or masses        Vagina: normal without lesions or masses.  Left labia with 1/2 cm soft skin tag--skin colored        Cervix: normal without lesions or masses        Adnexa: normal  bimanual exam without masses or fullness        Uterus: normal by palpation        Pap smear: performed Msk:  No deformity or scoliosis noted of thoracic or lumbar spine.   Pulses:  R and L carotid,radial,femoral,dorsalis pedis and posterior tibial pulses are full and equal bilaterally Extremities:  No clubbing, cyanosis, edema, or deformity noted with normal full range of motion of all joints.   Neurologic:  No cranial nerve deficits noted. Station and gait are normal. Plantar reflexes are down-going bilaterally. DTRs are symmetrical throughout. Sensory, motor and coordinative functions appear intact. Skin:  Intact without suspicious lesions or rashes Cervical Nodes:  No lymphadenopathy noted Axillary Nodes:  No palpable lymphadenopathy Inguinal Nodes:  No significant adenopathy Psych:  Cognition and judgment appear intact. Alert and cooperative with normal attention span and concentration. No apparent delusions, illusions, hallucinations   Impression & Recommendations:  Problem # 1:  ROUTINE GYNECOLOGICAL EXAMINATION (ICD-V72.31) Mammogram scheduled Orders: UA Dipstick w/o Micro (automated)  (81003) Pap Smear, Thin Prep ( Collection of) (Z6109) KOH/ WET Mount [id number]) T-Pap Smear, Thin Prep (09811)  Problem # 2:  PREVENTIVE HEALTH CARE (ICD-V70.0) Guaiac cards x 3 to return in 2 weeks. 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 1 tab by mouth daily 2)  Systane Ultra 0.4-0.3 % Soln (Polyethyl glycol-propyl glycol) .... Two drops in both eyes 3)  Crestor 5 Mg Tabs (Rosuvastatin calcium) .Marland Kitchen.. 1 tab by mouth  every other day 4)  Klor-con M20 20 Meq Cr-tabs (Potassium chloride crys cr) .... 2 tabs by mouth daily 5)  Furosemide 40 Mg Tabs (Furosemide) .Marland Kitchen.. 1 tab by mouth two times a day 6)  Coumadin 2 Mg Tabs (Warfarin sodium) .Marland Kitchen.. 1 tab by mouth daily 1/2 tab only on mondays--dr. tennant 7)  Toprol Xl 100 Mg Xr24h-tab (Metoprolol succinate) .Marland Kitchen.. 1 tab by mouth daily 8)  Adult Aspirin Low Strength 81 Mg Tbdp (Aspirin) .Marland Kitchen.. 1 tab by mouth daily 9)  Allopurinol 300 Mg Tabs (Allopurinol) .Marland Kitchen.. 1 tab by mouth daily 10)  Folic Acid 1 Mg Tabs (Folic acid) .Marland Kitchen.. 1 tab by mouth daily  Other Orders: Flu Vaccine 90yrs + (16109) Admin 1st Vaccine (60454) Radiology Referral (Radiology)  Patient Instructions: 1)  Schedule fasting labs:  CBC, CMET, A1C 2)  Follow up with Dr. Delrae Alfred in 6 months --sugars   Orders Added: 1)  Flu Vaccine 61yrs + [90658] 2)  Admin 1st Vaccine [90471] 3)  UA Dipstick w/o Micro (automated)  [81003] 4)  Physical Therapy Referral [PT] 5)  Radiology Referral [Radiology] 6)  Est. Patient age 2-64 [18] 7)  Pap Smear, Thin Prep ( Collection of) [Q0091] 8)  KOH/ WET Mount [87210] 9)  T-Pap Smear, Thin Prep [09811] 10)  Physical Therapy Referral [PT] 11)  Gastroenterology Referral [GI]   Immunization History:  Tetanus/Td Immunization History:    Tetanus/Td:  historical (08/27/2000)  Immunizations Administered:  Influenza Vaccine # 1:    Vaccine Type: Fluvax 3+    Site: left deltoid    Mfr: GlaxoSmithKline    Dose: 0.5 ml    Route: IM    Given by: Hale Drone CMA    Exp. Date: 02/24/2011    Lot #: [id number]    VIS given: 03/21/10 version given August 04, 2010.  Flu Vaccine Consent Questions:    Do you have a history of severe allergic reactions to this vaccine? no    Any prior history of allergic reactions to egg and/or gelatin? no    Do you have a sensitivity to the preservative Thimersol? no    Do you have a past history of Guillan-Barre Syndrome? no    Do  you currently have an acute febrile illness? no    Have you ever had a severe reaction to latex? no    Vaccine information given and explained to patient? yes    Are you currently pregnant? no   Immunization History:  Tetanus/Td Immunization History:    Tetanus/Td:  Historical (08/27/2000)  Immunizations Administered:  Influenza Vaccine # 1:    Vaccine Type: Fluvax 3+    Site: left deltoid    Mfr: GlaxoSmithKline    Dose: 0.5 ml    Route: IM    Given by: Hale Drone CMA    Exp. Date: 02/24/2011    Lot #: [id number]    VIS given: 03/21/10 version given August 04, 2010.    Preventive Care  Screening     Mammogram:  last 6 years ago SBE:  Occasional--no changes LMP:  stopped around age 41. Osteoprevention:  Not much in way of dairy.  Does walk regularly. Guaiac cards: Performed last over 3 years ago. Colonoscopy:  never Immunizations:  has never had a pneumovax--not available in clinic today.   Laboratory Results   Urine Tests  Date/Time Received: August 04, 2010 11:40 AM   Routine Urinalysis   Color: lt. yellow Appearance: Clear Glucose: negative   (Normal Range: Negative) Bilirubin: negative   (Normal Range: Negative) Ketone: negative   (Normal Range: Negative) Spec. Gravity: 1.010   (Normal Range: 1.003-1.035) Blood: small   (Normal Range: Negative) pH: 5.0   (Normal Range: 5.0-8.0) Protein: negative   (Normal Range: Negative) Urobilinogen: 0.2   (Normal Range: 0-1) Nitrite: negative   (Normal Range: Negative) Leukocyte Esterace: negative   (Normal Range: Negative)     Blood Tests     CBG Random:: 93mg /dL    Wet Mount Source: vaginal WBC/hpf: 1-5 Bacteria/hpf: rare Clue cells/hpf: none  Negative whiff Yeast/hpf: none Wet Mount KOH: Negative Trichomonas/hpf: none     Laboratory Results   Urine Tests    Routine Urinalysis   Color: lt. yellow Appearance: Clear Glucose: negative   (Normal Range: Negative) Bilirubin: negative    (Normal Range: Negative) Ketone: negative   (Normal Range: Negative) Spec. Gravity: 1.010   (Normal Range: 1.003-1.035) Blood: small   (Normal Range: Negative) pH: 5.0   (Normal Range: 5.0-8.0) Protein: negative   (Normal Range: Negative) Urobilinogen: 0.2   (Normal Range: 0-1) Nitrite: negative   (Normal Range: Negative) Leukocyte Esterace: negative   (Normal Range: Negative)     Blood Tests     CBG Random:: 93    Wet Mount/KOH  Negative whiff   Appended Document: neg Hemmoccult    Clinical Lists Changes  Observations: Added new observation of HEMOCCULT 1: negative (08/15/2010 14:42)      Laboratory Results    Stool - Occult Blood Hemmoccult #1: negative Date: 08/15/2010 Hemoccult #2: negative Hemoccult #3: negative  Kit Test Internal QC: Negative   (Normal Range: Negative)

## 2010-09-28 NOTE — Progress Notes (Signed)
  Phone Note Outgoing Call   Summary of Call: Please schedule pt. for CPP--has not had here--see if can get in in next 3-4 months please. Noted in hematology note that she has not yet had a colonoscopy--will arrange after CPP Initial call taken by: Julieanne Manson MD,  May 18, 2010 8:48 AM  Follow-up for Phone Call        pt aware Michelle Nasuti  May 22, 2010 12:15 PM  Follow-up by: Michelle Nasuti,  May 22, 2010 12:15 PM

## 2010-09-28 NOTE — Letter (Signed)
Summary: RECEIVED RECORDS FROM [facility]/CARDIOLOGY  RECEIVED RECORDS FROM [facility]IOLOG[facility]   Imported By: Arta Bruce 12/01/2009 11:10:02  _____________________________________________________________________  External Attachment:    Type:   Image     Comment:   External Document

## 2010-09-28 NOTE — Letter (Signed)
Summary: RECEIVED RECORDS FROM ORTHOPEDIC  RECEIVED RECORDS FROM ORTHOPEDIC   Imported By: Arta Bruce 04/17/2010 10:39:40  _____________________________________________________________________  External Attachment:    Type:   Image     Comment:   External Document

## 2010-09-28 NOTE — Letter (Signed)
Summary: *HSN Results Follow up  [facility]  [address] [place], [place] [address]   Phone: [phone]  Fax: [phone]      08/22/2010   Deborah [name] [address] [place], [place]  [address]   Dear  Ms. Deborah Mcneill,                            ____S.Drinkard,FNP   ____D. Gore,FNP       ____B. McPherson,MD   ____V. Rankins,MD    __X__E. Mulberry,MD    ____N. Daphine Deutscher, FNP  ____D. Reche Dixon, MD    ____K. Philipp Deputy, MD    ____Other     This letter is to inform you that your recent test(s):  __X_____Pap Smear    _______Lab Test     ____X___X-ray    ___X____ is within acceptable limits  _______ requires a medication change  _______ requires a follow-up lab visit  _______ requires a follow-up visit with your Sheryl Saintil   Comments:  Mammogram normal as well.       _________________________________________________________ If you have any questions, please contact our office                     Sincerely,  Julieanne Manson MD [facility]

## 2010-09-28 NOTE — Progress Notes (Signed)
Summary: Hematology Referral   Phone Note Outgoing Call   Summary of Call: hematology referral--order, all recent labs ( past 6 months) and letter.  Please hold until at least next week as needing to speak with pt. first Initial call taken by: Julieanne Manson MD,  March 22, 2010 3:18 PM  Follow-up for Phone Call        Sent to [facility] Follow-up by: Candi Leash,  March 24, 2010 8:43 AM  Additional Follow-up for Phone Call Additional follow up Details #1::        Go ahead and set up Additional Follow-up by: Julieanne Manson MD,  April 04, 2010 9:53 AM    Additional Follow-up for Phone Call Additional follow up Details #2::    I SEND THE REFERRAL TO [facility] . WAITING FOR AN APPT  Follow-up by: Cheryll Dessert,  April 04, 2010 10:48 AM

## 2010-09-28 NOTE — Letter (Signed)
Summary: *HSN Results Follow up  [facility]  [address] [place], [place] [address]   Phone: [phone]  Fax: [phone]      04/05/2010   [name] [address] [place], [place]  [address]   Dear  [name],                            ____S.Drinkard,FNP   ____D. Gore,FNP       ____B. McPherson,MD   ____V. Rankins,MD    _X___E. Deborah Putzier,MD    ____N. Daphine Deutscher, FNP  ____D. Reche Dixon, MD    ____K. Philipp Deputy, MD    ____Other     This letter is to inform you that your recent test(s):  _______Pap Smear    ___X____Lab Test     _______X-ray    ___X____ is within acceptable limits  _______ requires a medication change  _______ requires a follow-up lab visit  _______ requires a follow-up visit with your provider   Comments:  Iron testing was okay.  Will enclose a copy of labs.       _________________________________________________________ If you have any questions, please contact our office                     Sincerely,  Deborah Manson MD [facility]

## 2010-09-28 NOTE — Miscellaneous (Signed)
Summary: Ortho old records.  Clinical Lists Changes  Problems: Added new problem of CARPAL TUNNEL SYNDROME, LEFT (ICD-354.0) - moderate:  nerve conductions studies--Dr. 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 Dorene Grebe

## 2010-09-28 NOTE — Letter (Signed)
Summary: [facility]NEW [facility]AL  [facility]AL   Imported By: Arta Bruce 05/18/2010 10:09:12  _____________________________________________________________________  External Attachment:    Type:   Image     Comment:   External Document

## 2010-09-28 NOTE — Letter (Signed)
Summary: [facility]//PROGRESS NOTE  [facility]//PROGRESS NOTE   Imported By: Arta Bruce 11/25/2009 10:55:56  _____________________________________________________________________  External Attachment:    Type:   Image     Comment:   External Document

## 2010-10-04 ENCOUNTER — Encounter (INDEPENDENT_AMBULATORY_CARE_PROVIDER_SITE_OTHER): Payer: Self-pay | Admitting: Internal Medicine

## 2010-10-18 ENCOUNTER — Other Ambulatory Visit (INDEPENDENT_AMBULATORY_CARE_PROVIDER_SITE_OTHER): Payer: Medicaid Other

## 2010-10-18 DIAGNOSIS — Z7901 Long term (current) use of anticoagulants: Secondary | ICD-10-CM

## 2010-10-24 ENCOUNTER — Encounter (HOSPITAL_BASED_OUTPATIENT_CLINIC_OR_DEPARTMENT_OTHER): Payer: Medicaid Other | Admitting: Oncology

## 2010-10-24 ENCOUNTER — Other Ambulatory Visit: Payer: Self-pay | Admitting: Oncology

## 2010-10-24 DIAGNOSIS — D582 Other hemoglobinopathies: Secondary | ICD-10-CM

## 2010-10-24 LAB — CBC & DIFF AND RETIC
BASO%: 0.9 % (ref 0.0–2.0)
HCT: 36.8 % (ref 34.8–46.6)
Immature Retic Fract: 16.6 % — ABNORMAL HIGH (ref 0.00–10.70)
MCHC: 32.1 g/dL (ref 31.5–36.0)
MONO#: 0.7 10*3/uL (ref 0.1–0.9)
NEUT#: 2.7 10*3/uL (ref 1.5–6.5)
NEUT%: 41.8 % (ref 38.4–76.8)
RBC: 5.1 10*6/uL (ref 3.70–5.45)
Retic %: 2.57 % — ABNORMAL HIGH (ref 0.50–1.50)
WBC: 6.4 10*3/uL (ref 3.9–10.3)
lymph#: 2.8 10*3/uL (ref 0.9–3.3)

## 2010-10-24 LAB — IRON AND TIBC: Iron: 101 ug/dL (ref 42–145)

## 2010-11-07 NOTE — Letter (Signed)
Summary: [facility]//COUMADIN AUTH//VIRTUAL COLON  [facility]//COUMADIN AUTH//VIRTUAL COLON   Imported By: Arta Bruce 10/30/2010 14:13:22  _____________________________________________________________________  External Attachment:    Type:   Image     Comment:   External Document

## 2010-11-13 ENCOUNTER — Other Ambulatory Visit: Payer: Self-pay | Admitting: Gastroenterology

## 2010-11-13 DIAGNOSIS — Z1211 Encounter for screening for malignant neoplasm of colon: Secondary | ICD-10-CM

## 2010-11-16 ENCOUNTER — Ambulatory Visit (INDEPENDENT_AMBULATORY_CARE_PROVIDER_SITE_OTHER): Payer: Medicaid Other | Admitting: *Deleted

## 2010-11-16 DIAGNOSIS — Z7901 Long term (current) use of anticoagulants: Secondary | ICD-10-CM

## 2010-11-16 DIAGNOSIS — I359 Nonrheumatic aortic valve disorder, unspecified: Secondary | ICD-10-CM

## 2010-11-16 DIAGNOSIS — I059 Rheumatic mitral valve disease, unspecified: Secondary | ICD-10-CM | POA: Insufficient documentation

## 2010-11-23 ENCOUNTER — Telehealth: Payer: Self-pay | Admitting: *Deleted

## 2010-11-23 NOTE — Telephone Encounter (Signed)
Message copied by Barnetta Hammersmith on Thu Nov 23, 2010 12:22 PM ------      Message from: Delrae Alfred      Created: Wed Nov 22, 2010  9:18 AM      Regarding: [facility]      Contact: [phone]       HAVING A COLONOSCOPY NEXT WEEK.  CONCERNED ABOUT PREP AND HOLDING HER MEDS.

## 2010-11-23 NOTE — Telephone Encounter (Signed)
Pt called, having barium enema next week and would like to make sure it is ok to drink the prep.  Dr. Deborah Chalk notified and instructed RN it was ok for pt to have barium enema and the prep is ok for pt to do.  Pt notified and verbalized to RN understanding of instructions.

## 2010-11-27 ENCOUNTER — Ambulatory Visit (INDEPENDENT_AMBULATORY_CARE_PROVIDER_SITE_OTHER): Payer: Medicaid Other | Admitting: *Deleted

## 2010-11-27 ENCOUNTER — Other Ambulatory Visit: Payer: Medicaid Other

## 2010-11-27 DIAGNOSIS — I059 Rheumatic mitral valve disease, unspecified: Secondary | ICD-10-CM

## 2010-11-27 DIAGNOSIS — Z7901 Long term (current) use of anticoagulants: Secondary | ICD-10-CM

## 2010-11-29 ENCOUNTER — Encounter: Payer: Medicaid Other | Admitting: *Deleted

## 2010-12-04 ENCOUNTER — Encounter: Payer: Medicaid Other | Admitting: *Deleted

## 2010-12-06 ENCOUNTER — Ambulatory Visit (INDEPENDENT_AMBULATORY_CARE_PROVIDER_SITE_OTHER): Payer: Medicaid Other | Admitting: *Deleted

## 2010-12-06 DIAGNOSIS — Z7901 Long term (current) use of anticoagulants: Secondary | ICD-10-CM

## 2010-12-06 DIAGNOSIS — I059 Rheumatic mitral valve disease, unspecified: Secondary | ICD-10-CM

## 2010-12-06 DIAGNOSIS — Z954 Presence of other heart-valve replacement: Secondary | ICD-10-CM

## 2010-12-14 ENCOUNTER — Ambulatory Visit (INDEPENDENT_AMBULATORY_CARE_PROVIDER_SITE_OTHER): Payer: Medicaid Other | Admitting: *Deleted

## 2010-12-14 DIAGNOSIS — Z1211 Encounter for screening for malignant neoplasm of colon: Secondary | ICD-10-CM

## 2010-12-14 DIAGNOSIS — Z954 Presence of other heart-valve replacement: Secondary | ICD-10-CM

## 2010-12-14 DIAGNOSIS — Z7901 Long term (current) use of anticoagulants: Secondary | ICD-10-CM

## 2010-12-14 DIAGNOSIS — I059 Rheumatic mitral valve disease, unspecified: Secondary | ICD-10-CM

## 2010-12-14 LAB — POCT INR: INR: 2.6

## 2010-12-20 ENCOUNTER — Encounter: Payer: Medicaid Other | Admitting: *Deleted

## 2010-12-25 ENCOUNTER — Other Ambulatory Visit: Payer: Self-pay | Admitting: *Deleted

## 2010-12-25 DIAGNOSIS — Z79899 Other long term (current) drug therapy: Secondary | ICD-10-CM

## 2010-12-25 DIAGNOSIS — E7800 Pure hypercholesterolemia, unspecified: Secondary | ICD-10-CM

## 2010-12-26 ENCOUNTER — Other Ambulatory Visit: Payer: Self-pay | Admitting: Cardiology

## 2011-01-04 ENCOUNTER — Other Ambulatory Visit: Payer: Self-pay | Admitting: Cardiology

## 2011-01-04 NOTE — Telephone Encounter (Signed)
PHARMACY called regarding her warfarin prescription.

## 2011-01-05 ENCOUNTER — Encounter: Payer: Self-pay | Admitting: Cardiology

## 2011-01-05 NOTE — Telephone Encounter (Signed)
RN faxed in prescription for Brand Name Coumadin 2 mg per pt's request to [facility]  [address]  Pt aware.

## 2011-01-10 ENCOUNTER — Encounter: Payer: Self-pay | Admitting: Cardiology

## 2011-01-10 DIAGNOSIS — I099 Rheumatic heart disease, unspecified: Secondary | ICD-10-CM | POA: Insufficient documentation

## 2011-01-10 DIAGNOSIS — R002 Palpitations: Secondary | ICD-10-CM | POA: Insufficient documentation

## 2011-01-10 DIAGNOSIS — E119 Type 2 diabetes mellitus without complications: Secondary | ICD-10-CM | POA: Insufficient documentation

## 2011-01-10 DIAGNOSIS — I482 Chronic atrial fibrillation, unspecified: Secondary | ICD-10-CM | POA: Insufficient documentation

## 2011-01-10 DIAGNOSIS — R5383 Other fatigue: Secondary | ICD-10-CM | POA: Insufficient documentation

## 2011-01-10 DIAGNOSIS — Z7901 Long term (current) use of anticoagulants: Secondary | ICD-10-CM | POA: Insufficient documentation

## 2011-01-10 DIAGNOSIS — R51 Headache: Secondary | ICD-10-CM | POA: Insufficient documentation

## 2011-01-10 DIAGNOSIS — E785 Hyperlipidemia, unspecified: Secondary | ICD-10-CM | POA: Insufficient documentation

## 2011-01-10 DIAGNOSIS — E669 Obesity, unspecified: Secondary | ICD-10-CM | POA: Insufficient documentation

## 2011-01-10 DIAGNOSIS — R109 Unspecified abdominal pain: Secondary | ICD-10-CM | POA: Insufficient documentation

## 2011-01-10 DIAGNOSIS — R519 Headache, unspecified: Secondary | ICD-10-CM | POA: Insufficient documentation

## 2011-01-11 ENCOUNTER — Encounter: Payer: Self-pay | Admitting: Cardiology

## 2011-01-11 ENCOUNTER — Ambulatory Visit (INDEPENDENT_AMBULATORY_CARE_PROVIDER_SITE_OTHER): Payer: Medicaid Other | Admitting: *Deleted

## 2011-01-11 ENCOUNTER — Ambulatory Visit (INDEPENDENT_AMBULATORY_CARE_PROVIDER_SITE_OTHER): Payer: Medicaid Other | Admitting: Cardiology

## 2011-01-11 ENCOUNTER — Other Ambulatory Visit (INDEPENDENT_AMBULATORY_CARE_PROVIDER_SITE_OTHER): Payer: Medicaid Other | Admitting: *Deleted

## 2011-01-11 DIAGNOSIS — Z9889 Other specified postprocedural states: Secondary | ICD-10-CM

## 2011-01-11 DIAGNOSIS — E785 Hyperlipidemia, unspecified: Secondary | ICD-10-CM

## 2011-01-11 DIAGNOSIS — E7800 Pure hypercholesterolemia, unspecified: Secondary | ICD-10-CM

## 2011-01-11 DIAGNOSIS — I059 Rheumatic mitral valve disease, unspecified: Secondary | ICD-10-CM

## 2011-01-11 DIAGNOSIS — R05 Cough: Secondary | ICD-10-CM

## 2011-01-11 DIAGNOSIS — Z79899 Other long term (current) drug therapy: Secondary | ICD-10-CM

## 2011-01-11 DIAGNOSIS — Z954 Presence of other heart-valve replacement: Secondary | ICD-10-CM

## 2011-01-11 DIAGNOSIS — Z7901 Long term (current) use of anticoagulants: Secondary | ICD-10-CM

## 2011-01-11 DIAGNOSIS — I4891 Unspecified atrial fibrillation: Secondary | ICD-10-CM

## 2011-01-11 DIAGNOSIS — Z952 Presence of prosthetic heart valve: Secondary | ICD-10-CM

## 2011-01-11 LAB — LIPID PANEL
LDL Cholesterol: 61 mg/dL (ref 0–99)
Total CHOL/HDL Ratio: 3
VLDL: 28 mg/dL (ref 0.0–40.0)

## 2011-01-11 LAB — HEPATIC FUNCTION PANEL
Bilirubin, Direct: 0.2 mg/dL (ref 0.0–0.3)
Total Bilirubin: 1.9 mg/dL — ABNORMAL HIGH (ref 0.3–1.2)

## 2011-01-11 LAB — CBC WITH DIFFERENTIAL/PLATELET
Basophils Relative: 0.7 % (ref 0.0–3.0)
Eosinophils Absolute: 0.2 10*3/uL (ref 0.0–0.7)
Hemoglobin: 11.8 g/dL — ABNORMAL LOW (ref 12.0–15.0)
Lymphocytes Relative: 37.8 % (ref 12.0–46.0)
MCHC: 32 g/dL (ref 30.0–36.0)
MCV: 76.1 fl — ABNORMAL LOW (ref 78.0–100.0)
Monocytes Absolute: 0.6 10*3/uL (ref 0.1–1.0)
Neutro Abs: 3.8 10*3/uL (ref 1.4–7.7)
RBC: 4.84 Mil/uL (ref 3.87–5.11)

## 2011-01-11 LAB — BASIC METABOLIC PANEL
BUN: 17 mg/dL (ref 6–23)
Chloride: 105 mEq/L (ref 96–112)
Glucose, Bld: 97 mg/dL (ref 70–99)
Potassium: 4.1 mEq/L (ref 3.5–5.1)

## 2011-01-11 NOTE — Progress Notes (Signed)
Subjective:   Deborah [name] is seen today for a six-month followup visit. She has a history of rheumatic heart disease with previous mitral valve replacement and tricuspid valve repair. She has chronic atrial fibrillation and is managed on chronic warfarin anticoagulation. Her surgery was performed in 2002 at [facility] in general, she's done well but is complaining of some vague discomfort in her mid back between her scapulae.  This is not associated with exertion or significant shortness of breath. It does not radiate. She thinks is similar to the discomfort she had before her valve surgery. She does not have any significant cough.  Other problems include chronic fatigue as well as hyperlipemia and obesity. 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 She does have borderline elevation of glucose levels. She is a [place] immigrant. She is quite pleasant and brings me present of embroidery today.  Current Outpatient Prescriptions  Medication Sig Dispense Refill  . acetaminophen (TYLENOL) 325 MG tablet Take 650 mg by mouth every 6 (six) hours as needed.        Marland Kitchen aspirin 81 MG tablet Take 81 mg by mouth daily.        Marland Kitchen COUMADIN 2 MG tablet TAKE AS DIRECTED  36 tablet  10  . folic acid (FOLVITE) 1 MG tablet Take 1 mg by mouth daily.        . furosemide (LASIX) 40 MG tablet Take 40 mg by mouth 2 (two) times daily.        . metoprolol (TOPROL-XL) 100 MG 24 hr tablet Take 100 mg by mouth daily.        Marland Kitchen omeprazole (PRILOSEC) 20 MG capsule Take 20 mg by mouth daily.        . potassium chloride SA (K-DUR,KLOR-CON) 20 MEQ tablet Take 20 mEq by mouth 2 (two) times daily.        . rosuvastatin (CRESTOR) 5 MG tablet Take 5 mg by mouth every other day.          Allergies  Allergen Reactions  . Phenergan   . Promethazine Hcl     REACTION: made her real shaky    Patient Active Problem List  Diagnoses  . HYPERLIPIDEMIA  . GOUT  . CARPAL TUNNEL SYNDROME, LEFT  . TRICUSPID REGURGITATION,  MODERATE WITH MILD PULMONARY HTN  . FIBRILLATION, ATRIAL  . HEMORRHOIDS, EXTERNAL  . GERD  . ROTATOR CUFF SYNDROME, RIGHT  . HYPERBILIRUBINEMIA  . MICROCYTOSIS  . HYPERGLYCEMIA  . BACK STRAIN, LUMBAR  . LIVER FUNCTION TESTS, ABNORMAL, HX OF  . RHEUMATIC FEVER, HX OF  . MECHANICAL MITRAL VALVE REPLACEMENT, HX OF  . Mitral valve disorders  . Rheumatic heart disease  . Chronic atrial fibrillation  . Chronic anticoagulation  . Fatigue  . Diabetes mellitus  . Abdominal pain  . History of mitral valve replacement  . Hyperlipidemia  . Obesity  . Anemia  . Palpitations  . Headache    History  Smoking status  . Never Smoker   Smokeless tobacco  . Never Used    History  Alcohol Use No    No family history on file.  Review of Systems:   The patient denies any heat or cold intolerance.  No weight gain or weight loss.  The patient denies headaches or blurry vision.  There is no cough or sputum production.  The patient denies dizziness.  There is no hematuria or hematochezia.  The patient denies any muscle aches or arthritis.  The patient denies any  rash.  The patient denies frequent falling or instability.  There is no history of depression or anxiety.  All other systems were reviewed and are negative.  INR is 2.8 today  Physical Exam:   Blood pressure is 110/78. Heart rate is 68 and irregular. Weight is 158.  The head is normocephalic and atraumatic.  Pupils are equally round and reactive to light.  Sclerae nonicteric.  Conjunctiva is clear.  Oropharynx is unremarkable.  There's adequate oral airway.  Neck is supple there are no masses.  Thyroid is not enlarged.  There is no lymphadenopathy.  Lungs are clear.  Chest is symmetric.  Heart shows an irregular rate and rhythm.  S1 and S2 are normal. There's a apical murmur  Abdomen is soft normal bowel sounds.  There is no organomegaly.  Genital and rectal deferred.  Extremities are without edema.  Peripheral pulses are adequate.   Neurologically intact.  Full range of motion.  The patient is not depressed.  Skin is warm and dry.  Assessment / Plan:

## 2011-01-11 NOTE — Assessment & Plan Note (Signed)
We'll check 2-D echocardiogram and chest x-ray today in part related to the discomfort in her back and also in followup of her valve replacement.

## 2011-01-11 NOTE — Assessment & Plan Note (Signed)
She continues to be reasonably stable with her valvular heart surgery.her INR is 2.8. We'll repeat that in one month. I will have her see Dr. [name] in approximately 4 months.

## 2011-01-11 NOTE — Assessment & Plan Note (Signed)
We'll continue lipid-lowering medications

## 2011-01-11 NOTE — Assessment & Plan Note (Signed)
She is in chronic atrial fibrillation. Rate is primarily controlled with metoprolol.  Her atrial fibrillation is chronic.

## 2011-01-12 ENCOUNTER — Telehealth: Payer: Self-pay | Admitting: *Deleted

## 2011-01-12 ENCOUNTER — Ambulatory Visit
Admission: RE | Admit: 2011-01-12 | Discharge: 2011-01-12 | Disposition: A | Discharge status: Home / Self Care | Payer: Medicaid Other | Source: Ambulatory Visit | Attending: Cardiology | Admitting: Cardiology

## 2011-01-12 DIAGNOSIS — R05 Cough: Secondary | ICD-10-CM

## 2011-01-12 NOTE — Discharge Summary (Signed)
NAMEJiles [name] NO.:  [id number]   MEDICAL RECORD NO.:  [id number]          PATIENT TYPE:  INP   LOCATION:  6533                         FACILITY:  [facility]   PHYSICIAN:  Colleen Can. Deborah Chalk, M.D.DATE OF BIRTH:  10-17-1950   DATE OF ADMISSION:  06/26/2005  DATE OF DISCHARGE:  06/29/2005                                 DISCHARGE SUMMARY   DISCHARGE DIAGNOSES:  1.  Dizziness with negative CT scan of the head as well as negative MRI      revealing chronic small vessel disease.  2.  Shortness of breath of uncertain etiology.  3.  Chest pain with negative cardiac enzymes.   SECONDARY DISCHARGE DIAGNOSES:  1.  Valvular disease with history of previous mitral valve replacement and      tricuspid valve annuloplasty.  2.  Chronic atrial fibrillation.  3.  Chronic Coumadin.  4.  Obesity.  5.  Hyperlipidemia.  6.  History of anemia.   HISTORY OF PRESENT ILLNESS:  The patient is a pleasant 60 year old female  from [place].  She has a known history of chronic atrial fibrillation  with multi-valvular disease.  She presents to the hospital for evaluation of  chest pain and vertigo.  She came to the emergency department on the night  prior to admission with complaints of chest pain that began while she was  sedentary at home.  It was described as a pressure-like sensation in the  left chest with some radiation to the right side of the neck as well as down  the left arm.  There was some component of shortness of breath, diaphoresis,  and nausea.  She also had symptoms where she was having a sensation that the  room was spinning.  This improved after receiving a dose of Antivert in the  emergency room.  She was subsequently seen and evaluated by Dr. Darol Destine and was admitted for further medial management.   Please see the dictated history and physical per Dr. Darol Destine for  further patient presentation and profile.   LABORATORY DATA:  Hemoglobin was  12, hematocrit 39, white count 9.9,  platelets are 195.  MCV is 75.  Chemistries were normal except for a glucose  of 118.  INR was therapeutic at 2.5.  LFT's were basically normal.  Cardiac  enzymes were negative x3.   Chest x-ray on admission shows stable cardiomegaly with no acute findings.   CT scan of the head without contrast was negative.   MRI of the head showed chronic small vessel disease with no acute infarct.   HOSPITAL COURSE:  The patient was admitted electively.  She was placed on  nitroglycerin which was subsequently weaned and discontinued.  Her Coumadin  was therapeutic, and she did not require further anticoagulation.  She ruled  out negative for myocardial infarction.  We did proceed on with a MRI scan  of the head to rule out acute stroke.  This was subsequently negative, and  on the morning of June 29, 2005, the patient was deemed stable for  discharge.  She continued to  have some mild episodes of dizziness, but no  further chest pain.   CONDITION ON DISCHARGE:  Stable.   DISCHARGE MEDICATIONS:  1.  Coumadin.  2.  Toprol.  3.  Lasix.  4.  Crestor.  5.  Potassium.  6.  Iron.  7.  She can continue to use meclizine on a p.r.n. basis.  No changes were made during this admission with medications.   FOLLOWUP:  We will plan on seeing her back in the office in approximately  two weeks.  She needs to call to schedule that appointment.  Certainly  sooner if any problems would arise in the interim.      [name], N.P.      Colleen Can. Deborah Chalk, M.D.  Electronically Signed    LC/MEDQ  D:  06/29/2005  T:  06/30/2005  Job:  [id number]

## 2011-01-12 NOTE — Consult Note (Signed)
[place]  Patient:    Deborah [name] Visit Number: [id number] MRN: [id number]          Service Type: MED Location: 1800 [id number] [id number] Attending Physician:  Pearletha Alfred Dictated by:   Clovis Pu Patty Sermons, M.D. Proc. Date: 10/02/01 Admit Date:  10/02/2001   CC:         Colleen Can. Deborah Chalk, M.D.   Consultation Report  ER NOTE  HISTORY OF PRESENT ILLNESS:  This is a 60 year old woman, who presented to the emergency room complaining of chest pain and dizziness and vomiting.  She has a past history of rheumatic heart disease and, over the past year, underwent a St. Jude valve replacement of her mitral valve and a tricuspid annuloplasty at [facility] on April 23, 2001.  She was readmitted to this hospital and then transferred back to [facility] four days after discharge because of pericardial effusion.  Since then, she has done well.  She was last seen by Dr. Deborah Chalk on September 23, 2001.  She has been back at work in a [employer]. She was in her usual state of health until yesterday when she noted some tinnitus.  Last night, she had onset of severe vertigo and motion dizziness. She began vomiting and has vomited clear liquids six times this morning.  She initially complained of left chest pain which was subsequently resolved and, at the present time, she is comfortable and not having any chest pain and no further nausea.  Laboratory data so far, includes a troponin I of 0.01, total CK of 52.  Her electrolytes are normal, BUN 18, creatinine 0.8.  Liver function studies are normal except for a total bilirubin of 2.4.  Her prothrombin time is therapeutic at 23.8 with an INR of 2.7.  Her CBC shows a white count of 7900, hemoglobin 11.8, hematocrit 36.7, platelet count 214,000. Her electrocardiogram shows atrial fibrillation.  Chest x-ray shows cardiomegaly with clear lungs.  It was initially thought that the patient might be redeveloping  a pericardial effusion because of the cardiac silhouette on chest x-ray and, therefore, a 2-D echocardiogram was obtained as an emergency, and this shows no evidence of any pericardial fluid.  It demonstrates normal left ventricular function.  She has left atrial enlargement at 52.  She has aortic valve sclerosis, mild aortic insufficiency, moderate tricuspid regurgitation and normal prosthetic mitral valve function and no pericardial effusion.  PHYSICAL EXAMINATION:  VITAL SIGNS:  Blood pressure 200/70, pulse 77 and in atrial fibrillation.  SKIN:  Warm and dry.  HEAD AND NECK:  Normal.  Ears show normal tympanic membrane.  Sclerae clear. Mouth and pharynx are normal.  NECK:  Supple.  Jugular venous pressure slightly elevated.  Carotids have a normal upstroke and no bruits.  Thyroid normal.  CHEST:  Clear to auscultation.  HEART:  No pericardial rub.  There is no abnormal lift or heave.  There are good mitral valve opening and closing clicks audible.  ABDOMEN:  Soft and nontender.  EXTREMITIES:  No phlebitis.  IMPRESSION: 1. Normal mitral valve prosthetic function and no evidence of pericardial    tamponade. 2. Chronic cardiomegaly. 3. Chronic atrial fibrillation on Coumadin which is therapeutic. 4. Nausea and vomiting secondary to vertigo secondary to acute labyrinthitis.  DISPOSITION:  It will be okay to send her home from the emergency room.  She will follow up next week with Dr. Deborah Chalk in the office.  We will give her meclizine 25 mg q.8h.  p.r.n. for acute vertigo.  She is also requesting something for nasal congestion, and we prescribed Humibid LA generic 600 mg b.i.d. p.r.n..  CONDITION ON DISCHARGE:  Improved. Dictated by:   Clovis Pu Patty Sermons, M.D. Attending Physician:  Pearletha Alfred DD:  10/02/01 TD:  10/02/01 Job: [id number]

## 2011-01-12 NOTE — H&P (Signed)
NAMEGenevie Cheshire.:  [id number]   MEDICAL RECORD NO.:  [id number]          PATIENT TYPE:  EMS   LOCATION:  MAJO                         FACILITY:  [facility]   PHYSICIAN:  Ulyses Amor, MD DATE OF BIRTH:  May 17, 1951   DATE OF ADMISSION:  06/27/2005  DATE OF DISCHARGE:                                HISTORY & PHYSICAL   HISTORY OF PRESENT ILLNESS:  Deborah [name] is a 60 year old [place]  Woman who is admitted to [facility] for further evaluation of chest  pain and vertigo.   The patient has a history of cardiac disease.  She has previously undergone  mitral valve regurgitation and tricuspid valve annuloplasty.  She also has a  history of chronic atrial fibrillation  She was hospitalized here in  February of this year for chest pain, which was felt to be atypical and non-  cardiac in origin.  She returned to the emergency department tonight with  chest pain again.  The chest pain began while she was sedentary at home.  It  was described as a pressure in the left anterior chest.  It radiated to the  right neck and down the left arm.  It was associated with dyspnea,  diaphoresis, and nausea.  There were no exacerbating or ameliorating  factors.  It appeared not to be related to position, activity, meals, or  respirations.  It lasted approximately 1 hour and eventually resolved.  It  is largely gone at this time.  In association with the above symptoms, the  patient also experienced vertigo.  This was a sensation of the room spinning  around her.  It, too, has improved, though she has received a dose of  Antivert in the emergency department.   Other than the cardiac disease noted above, there is no history of  myocardial infarction or congestive heart failure.   The patient has a history of dyslipidemia.  There is no history of diabetes  mellitus, hypertension, or smoking.  There is no family history of coronary  artery disease.   SOCIAL  HISTORY:  The patient is married and has 4 children.  She lives with  her husband.  She does not smoke cigarettes, and she does not drink alcohol.   MEDICATIONS:  1.  Coumadin.  2.  Aspirin.  3.  Toprol-XL.  4.  Furosemide.  5.  Crestor.  6.  Potassium.  7.  Iron.   ALLERGIES:  PHENERGAN.   OPERATIONS:  Cardiac surgery as described above.   FAMILY HISTORY:  Notable for coronary artery disease and valvular heart  disease.   REVIEW OF SYSTEMS:  No new problems related to her head, eyes, ears, nose,  mouth, throat, lungs, gastrointestinal system, genitourinary system, or  extremities.  There is no history of neurologic or psychiatric disorder.  There is no history of fever, chills, or weight loss.   PHYSICAL EXAMINATION:  VITAL SIGNS:  Blood pressure 136/85, pulse 68 and  irregularly irregular, respirations 24, temperature 97.4, pulse oximetry  100% on 2 liters.  GENERAL:  The patient was a middle-aged Asian  woman in no discomfort.  She  was alert, oriented, appropriate, and responsive.  History was obtained via  her daughter, who interpreted.  HEENT:  Head, eyes, nose, and mouth were normal.  NECK:  Without thyromegaly or adenopathy.  Carotid pulses were palpable  bilaterally and without bruits.  CARDIAC:  No murmur, gallop, or rub.  Cardiac rhythm is irregularly  irregular.  No chest wall tenderness was noted.  LUNGS:  Clear.  ABDOMEN:  Soft and nontender.  There was no mass, hepatosplenomegaly, bruit,  distention, rebound, guarding, or rigidity.  Bowel sounds are normal.  BREASTS/PELVIC/RECTAL EXAMINATIONS:  Not performed, as they were not  pertinent to the reason for acute care hospitalization.  EXTREMITIES:  Without edema, deviation, or deformity.  Radial and dorsalis  pedis pulses were palpable bilaterally.  NEUROLOGIC:  Brief screening neurologic survey was unremarkable.   Electrocardiogram revealed atrial fibrillation with a ventricular rate of 61  beats per minute.   There was a rightward axis.  The tracing was otherwise  unremarkable.  The chest radiograph, as well as the head CT, per the  emergency room physician, were both normal.  The initial set of cardiac  markers revealed a myoglobin of 138, CK-MB less than 1.0, and troponin less  than 0.05.  INR was 2.5, potassium 3.6, BUN 13, and creatinine 0.8.  The  remaining studies were pending at the time of this dictation.   IMPRESSION:  1.  Chest pain, rule out cardiac ischemia.  2.  Status post mitral valve replacement and tricuspid valve annuloplasty.  3.  Chronic atrial fibrillation.  4.  Dyslipidemia.  5.  Vertigo.   PLAN:  1.  Telemetry.  2.  Serial cardiac enzymes.  3.  Aspirin.  4.  No heparin or Lovenox, as INR is 2.5.  5.  Intravenous nitroglycerin.  6.  Further measures per Dr. Deborah Chalk.   The patient's daughter was present throughout the patient encounter.      Ulyses Amor, MD  Electronically Signed     MSC/MEDQ  D:  06/27/2005  T:  06/27/2005  Job:  [id number]   cc:   Colleen Can. Deborah Chalk, M.D.  Fax: [phone]

## 2011-01-12 NOTE — Telephone Encounter (Signed)
Message copied by Barnetta Hammersmith on Fri Jan 12, 2011 10:00 AM ------      Message from: Roger Shelter      Created: Fri Jan 12, 2011  8:50 AM       Ok, recheck in six months.

## 2011-01-12 NOTE — Telephone Encounter (Signed)
LM on daughter's cell with lab results.  Pt's daughter was instructed to call back with any concerns.

## 2011-01-16 ENCOUNTER — Telehealth: Payer: Self-pay | Admitting: Cardiology

## 2011-01-16 MED ORDER — CEPHALEXIN 500 MG PO CAPS: 500.0000 mg | ORAL_CAPSULE | Freq: Three times a day (TID) | ORAL | Status: AC

## 2011-01-16 NOTE — Telephone Encounter (Addendum)
Left message regarding labs and x-ray. Asked that pt call back if she has any further questions. Deborah [name] returned call regarding labs and cxr ([phone]). Also reports mother is coughing, has sore throat and fever. Wondering if Dr Deborah Chalk would call something in. Informed daughter would check with Dr Deborah Chalk and get back to her. [phone]) Discussed the above with Dr Deborah Chalk. Script for Keflex tid for 5 days will be sent to [facility] on [address]. Deborah [name] informed of script and verbalizes understanding

## 2011-01-16 NOTE — Telephone Encounter (Signed)
Pt wants to talk to nurse about her recent xrays that were done. Chart in nurse's box.

## 2011-01-18 ENCOUNTER — Telehealth: Payer: Self-pay | Admitting: Cardiology

## 2011-01-18 NOTE — Telephone Encounter (Signed)
Pt did not know Keflex was called into pharmacy on 01/16/11;  Pt was instructed to pick up the Rx.

## 2011-01-18 NOTE — Telephone Encounter (Signed)
Pt called she said she saw dr Deborah Chalk last week and had xray because she was coughing a lot She said she is still coughing and would like antibiotics

## 2011-01-19 ENCOUNTER — Ambulatory Visit (HOSPITAL_COMMUNITY): Payer: Medicaid Other | Attending: Cardiology | Admitting: Radiology

## 2011-01-19 DIAGNOSIS — I079 Rheumatic tricuspid valve disease, unspecified: Secondary | ICD-10-CM | POA: Insufficient documentation

## 2011-01-19 DIAGNOSIS — Z954 Presence of other heart-valve replacement: Secondary | ICD-10-CM

## 2011-01-19 DIAGNOSIS — I059 Rheumatic mitral valve disease, unspecified: Secondary | ICD-10-CM

## 2011-01-24 ENCOUNTER — Telehealth: Payer: Self-pay | Admitting: *Deleted

## 2011-01-24 NOTE — Telephone Encounter (Signed)
Echo reported to pt 

## 2011-01-28 ENCOUNTER — Other Ambulatory Visit: Payer: Self-pay | Admitting: Cardiology

## 2011-02-08 ENCOUNTER — Ambulatory Visit (INDEPENDENT_AMBULATORY_CARE_PROVIDER_SITE_OTHER): Payer: Medicaid Other | Admitting: *Deleted

## 2011-02-08 ENCOUNTER — Telehealth: Payer: Self-pay | Admitting: Cardiovascular Disease

## 2011-02-08 DIAGNOSIS — Z954 Presence of other heart-valve replacement: Secondary | ICD-10-CM

## 2011-02-08 DIAGNOSIS — Z7901 Long term (current) use of anticoagulants: Secondary | ICD-10-CM

## 2011-02-08 DIAGNOSIS — I059 Rheumatic mitral valve disease, unspecified: Secondary | ICD-10-CM

## 2011-02-08 NOTE — Telephone Encounter (Signed)
She also kept repeating that [facility] didn't take copays for her and that she would like [facility] to get back to her as well even though I told here that our billing person would be in contact with her.

## 2011-02-08 NOTE — Telephone Encounter (Signed)
She was checking out and she asked if she could have someone tell her why she needs to pay so much and why she needs to pay every visit when at other offices she and her husband only need to pay for the office visit. She is on Medicaid. Please call back.

## 2011-02-13 ENCOUNTER — Other Ambulatory Visit: Payer: Self-pay | Admitting: Cardiology

## 2011-02-14 ENCOUNTER — Telehealth: Payer: Self-pay | Admitting: *Deleted

## 2011-02-14 ENCOUNTER — Encounter: Payer: Self-pay | Admitting: Physician Assistant

## 2011-02-14 ENCOUNTER — Ambulatory Visit (INDEPENDENT_AMBULATORY_CARE_PROVIDER_SITE_OTHER): Payer: Medicaid Other | Admitting: Physician Assistant

## 2011-02-14 VITALS — BP 110/72 | HR 58 | Resp 16 | Ht 59.0 in | Wt 157.0 lb

## 2011-02-14 DIAGNOSIS — I059 Rheumatic mitral valve disease, unspecified: Secondary | ICD-10-CM

## 2011-02-14 DIAGNOSIS — I079 Rheumatic tricuspid valve disease, unspecified: Secondary | ICD-10-CM

## 2011-02-14 DIAGNOSIS — R0602 Shortness of breath: Secondary | ICD-10-CM

## 2011-02-14 DIAGNOSIS — R1110 Vomiting, unspecified: Secondary | ICD-10-CM | POA: Insufficient documentation

## 2011-02-14 DIAGNOSIS — R42 Dizziness and giddiness: Secondary | ICD-10-CM

## 2011-02-14 DIAGNOSIS — I4891 Unspecified atrial fibrillation: Secondary | ICD-10-CM

## 2011-02-14 NOTE — Progress Notes (Signed)
History of Present Illness: Primary Cardiologist:  Dr. Jackalyn Lombard Deborah [name] is a 60 y.o. female with a history of rheumatic heart disease, status post mitral valve replacement and tricuspid valve repair at [facility] in 2002 as well as permanent atrial fibrillation on chronic anticoagulation therapy.  She previously was followed by Dr. Deborah Chalk.  He last saw her in May and recommended that she follow up in the future with Dr. [name].  Echocardiogram was arranged at that time to follow up on her mechanical mitral valve.  LV function was normal as well as her mitral valve prosthesis.  She has significant bi-atrial enlargement as well as moderate to severe tricuspid regurgitation and mildly elevated pulmonary artery systolic pressures.  She comes in today for dizziness and dyspnea.  The dizziness occurred last night while lying down.  She denies syncope or near syncope.  No spinning sensation.  She has taken meclizine in the past for dizziness and took this yesterday with relief.  She did get nauseated and vomited x 2.  No fevers or chills.  She was recently treated for bronchitis by Dr. Deborah Chalk with antibiotics.  Her cough is improved.  She fell recently.  She hit the left side of her chest and her left knee.  She has some discomfort over her left breast.  No bruising.  This is better.  She denies exertional chest heaviness.  She does note DOE.  She sleeps on 2 pillows.  She has chronic neck pain.  She did add an extra pillow one night this week due to dyspnea with lying flat.  No PND.  No edema.  She had an episode of dizziness about 2 weeks ago as well.  She dose note increased heart rate with her dizziness yesterday.    Past Medical History  Diagnosis Date  . Rheumatic heart disease     Status post mechanical mitral valve replacement and tricuspid valve repair at [facility] in 2002; echo 5/12:   EF 60-65%, mild AI, mitral valve prosthesis with AVA 1.66, severe LAE, moderate RAE, moderate to severe TR, mild  increased pulmonary artery systolic pressure;    Adenosine Cardiolite in 4/09:   No ischemia, EF 66%  . Chronic atrial fibrillation   . Chronic anticoagulation   . Fatigue   . Borderline diabetes   . History of mitral valve replacement   . Hyperlipidemia   . Obesity   . Anemia   . Palpitations   . Headache   . Gout   . Hypertension   . Carpal tunnel syndrome   . External hemorrhoids   . Rotator cuff syndrome     Current Outpatient Prescriptions  Medication Sig Dispense Refill  . acetaminophen (TYLENOL) 325 MG tablet Take 650 mg by mouth every 6 (six) hours as needed.        Marland Kitchen aspirin 81 MG tablet Take 81 mg by mouth daily.        Marland Kitchen COUMADIN 2 MG tablet TAKE AS DIRECTED  36 tablet  10  . folic acid (FOLVITE) 1 MG tablet Take 1 mg by mouth daily.        . furosemide (LASIX) 40 MG tablet Take 40 mg by mouth 2 (two) times daily.        . metoprolol (TOPROL-XL) 100 MG 24 hr tablet Take 100 mg by mouth daily.        Marland Kitchen omeprazole (PRILOSEC) 20 MG capsule Take 20 mg by mouth daily.        Marland Kitchen  potassium chloride SA (K-DUR,KLOR-CON) 20 MEQ tablet Take 20 mEq by mouth 2 (two) times daily.        . rosuvastatin (CRESTOR) 5 MG tablet Take 5 mg by mouth every other day.        Marland Kitchen DISCONTD: metoprolol (TOPROL-XL) 50 MG 24 hr tablet TAKE 2 TABLETS BY MOUTH EVERY DAY  60 tablet  6    Allergies: Allergies  Allergen Reactions  . Phenergan   . Promethazine Hcl     REACTION: made her real shaky    Social history:  Non smoker.  ROS:  See HPI.  She feels tired.  No fevers, chills, melena, hematochezia.  All other systems reviewed and negative.   Vital Signs: BP 110/72  Pulse 58  Resp 16  Ht 4\' 11"  (1.499 m)  Wt 157 lb (71.215 kg)  BMI 31.71 kg/m2  PHYSICAL EXAM: Well nourished, well developed, in no acute distress HEENT: normal Neck: no JVD Chest: no deformity left chest Breasts: left breast without ecchymosis or deformity, no tenderness to palpation Cardiac:  Mechanical S1, normal  S2, irreg irreg rhythm, no murmur Lungs:  clear to auscultation bilaterally, no wheezing, rhonchi or rales Abd: soft, nontender, no hepatomegaly Ext: no edema Skin: warm and dry Neuro:  CNs 2-12 intact, no focal abnormalities noted  EKG:  Atrial fibrillation, heart rate 58, normal axis, nonspecific ST-T wave changes  ASSESSMENT AND PLAN:

## 2011-02-14 NOTE — Assessment & Plan Note (Signed)
TR worse by recent echo.  Certainly possible that this is contributing to her dyspnea.  Obtain myoview to rule out other causes.

## 2011-02-14 NOTE — Telephone Encounter (Signed)
Spoke with pt's daughter Inetta Fermo, pt has been c/o shortness of breath and dizziness for two days.  Pt is requesting to be seen.  Norma Fredrickson NP notified and instructed RN to schedule pt to see Dr. [name].  RN spoke with Herbert Seta RN at Dr. Alford Highland office and she will call Inetta Fermo to set up appointment.

## 2011-02-14 NOTE — Assessment & Plan Note (Signed)
As noted, decrease Toprol to 50 mg QD.  Get event monitor.  Coumadin managed by [facility] cardiology office.  If monitor demonstrates tachy-brady, she may need to see EP for pacer.  Follow up with Dr. [name] in 4-6 weeks.

## 2011-02-14 NOTE — Patient Instructions (Addendum)
Your physician recommends that you schedule a follow-up appointment in: 4-6 WEEKS WITH DR. [name] AS PER SCOTT WEAVER, PA-C  Your physician recommends that you return for lab work in: TODAY BMET, CBC W/DIFF, TSH, BNP, LFT  Your physician has requested that you have a lexiscan myoview 786.05, 780.4. For further information please visit [email or web address] Please follow instruction sheet, as given.   Your physician has recommended that you wear an event monitor 427.31, 780.4, 786.05. Event monitors are medical devices that record the heart's electrical activity. Doctors most often [place] these monitors to diagnose arrhythmias. Arrhythmias are problems with the speed or rhythm of the heartbeat. The monitor is a small, portable device. You can wear one while you do your normal daily activities. This is usually used to diagnose what is causing palpitations/syncope (passing out).  Your physician has recommended you make the following change in your medication: DECREASE TOPROL TO 1/2 (HALF) TABELET = 50 MG ONCE DAILY

## 2011-02-14 NOTE — Assessment & Plan Note (Signed)
Recent echo demonstrates stable MV prosthesis.

## 2011-02-14 NOTE — Assessment & Plan Note (Signed)
Etiology not clear.  Get monitor and adjust beta blocker as noted.  She has some risk factors for CAD.  Last myoview in 2009.  Schedule Lexiscan myoview.

## 2011-02-14 NOTE — Assessment & Plan Note (Signed)
Etiology not clear.  She has several nonspecific symptoms.  Her heart rate is in the 50s.  Question if she is having some symptomatic bradycardia.  Decrease toprol to 50 mg QD.  Arrange event monitor.  Check labs:  Bmet, cbc, tsh, lft's and bnp.  Follow up with Dr. [name] in 4-6 weeks.

## 2011-02-15 ENCOUNTER — Telehealth: Payer: Self-pay | Admitting: *Deleted

## 2011-02-15 ENCOUNTER — Encounter: Payer: Self-pay | Admitting: *Deleted

## 2011-02-15 DIAGNOSIS — I4891 Unspecified atrial fibrillation: Secondary | ICD-10-CM

## 2011-02-15 LAB — TSH: TSH: 2.66 u[IU]/mL (ref 0.35–5.50)

## 2011-02-15 LAB — CBC WITH DIFFERENTIAL/PLATELET
Basophils Absolute: 0.1 10*3/uL (ref 0.0–0.1)
Eosinophils Absolute: 0.3 10*3/uL (ref 0.0–0.7)
HCT: 37.2 % (ref 36.0–46.0)
Hemoglobin: 12 g/dL (ref 12.0–15.0)
Lymphs Abs: 2.8 10*3/uL (ref 0.7–4.0)
MCHC: 32.1 g/dL (ref 30.0–36.0)
Neutro Abs: 2.8 10*3/uL (ref 1.4–7.7)
RDW: 15.1 % — ABNORMAL HIGH (ref 11.5–14.6)

## 2011-02-15 LAB — HEPATIC FUNCTION PANEL
Bilirubin, Direct: 0.2 mg/dL (ref 0.0–0.3)
Total Protein: 8.1 g/dL (ref 6.0–8.3)

## 2011-02-15 LAB — BASIC METABOLIC PANEL
BUN: 15 mg/dL (ref 6–23)
CO2: 29 mEq/L (ref 19–32)
Calcium: 9.3 mg/dL (ref 8.4–10.5)
Creatinine, Ser: 1 mg/dL (ref 0.4–1.2)

## 2011-02-15 LAB — BRAIN NATRIURETIC PEPTIDE: Pro B Natriuretic peptide (BNP): 129 pg/mL — ABNORMAL HIGH (ref 0.0–100.0)

## 2011-02-15 NOTE — Telephone Encounter (Signed)
S/w son Reuel Boom who translates for pt. He is aware of lab results and med change and repeat bmet, bnp appt. Danielle Rankin

## 2011-02-21 ENCOUNTER — Ambulatory Visit (INDEPENDENT_AMBULATORY_CARE_PROVIDER_SITE_OTHER): Payer: Medicaid Other | Admitting: *Deleted

## 2011-02-21 DIAGNOSIS — I059 Rheumatic mitral valve disease, unspecified: Secondary | ICD-10-CM

## 2011-02-21 DIAGNOSIS — Z954 Presence of other heart-valve replacement: Secondary | ICD-10-CM

## 2011-02-21 DIAGNOSIS — Z7901 Long term (current) use of anticoagulants: Secondary | ICD-10-CM

## 2011-02-22 ENCOUNTER — Other Ambulatory Visit (HOSPITAL_COMMUNITY): Payer: Medicaid Other | Admitting: Radiology

## 2011-02-22 ENCOUNTER — Encounter: Payer: Medicaid Other | Admitting: *Deleted

## 2011-02-22 ENCOUNTER — Other Ambulatory Visit: Payer: Medicaid Other | Admitting: *Deleted

## 2011-02-26 ENCOUNTER — Encounter: Payer: Self-pay | Admitting: Physician Assistant

## 2011-03-01 ENCOUNTER — Telehealth: Payer: Self-pay | Admitting: Physician Assistant

## 2011-03-01 NOTE — Telephone Encounter (Signed)
Message copied by Beatrice Lecher on Thu Mar 01, 2011 10:07 PM ------      Message from: Tarri Fuller      Created: Thu Mar 01, 2011 12:08 PM      Regarding: cancelled appts       Hi Deborah [name],             Just wanted to let you know that pt cancelled her monitor, cvrr, lexiscan and lab appt for 6/28 and is scheduled to see [name] on 7/18. I do not see that pt rsc any of these to be done before seeing [facility].

## 2011-03-01 NOTE — Telephone Encounter (Signed)
Note, patient canceled monitor and myoview. Tereso Newcomer, PA-C

## 2011-03-08 ENCOUNTER — Encounter: Payer: Self-pay | Admitting: Cardiology

## 2011-03-14 ENCOUNTER — Encounter: Payer: Self-pay | Admitting: Cardiology

## 2011-03-14 ENCOUNTER — Ambulatory Visit (INDEPENDENT_AMBULATORY_CARE_PROVIDER_SITE_OTHER): Payer: Medicaid Other | Admitting: Cardiology

## 2011-03-14 DIAGNOSIS — R0602 Shortness of breath: Secondary | ICD-10-CM

## 2011-03-14 DIAGNOSIS — R002 Palpitations: Secondary | ICD-10-CM

## 2011-03-14 DIAGNOSIS — I359 Nonrheumatic aortic valve disorder, unspecified: Secondary | ICD-10-CM

## 2011-03-14 DIAGNOSIS — I509 Heart failure, unspecified: Secondary | ICD-10-CM

## 2011-03-14 DIAGNOSIS — I482 Chronic atrial fibrillation, unspecified: Secondary | ICD-10-CM

## 2011-03-14 DIAGNOSIS — I5032 Chronic diastolic (congestive) heart failure: Secondary | ICD-10-CM

## 2011-03-14 DIAGNOSIS — I059 Rheumatic mitral valve disease, unspecified: Secondary | ICD-10-CM

## 2011-03-14 DIAGNOSIS — R079 Chest pain, unspecified: Secondary | ICD-10-CM

## 2011-03-14 LAB — BASIC METABOLIC PANEL
CO2: 29 mEq/L (ref 19–32)
GFR: 65.71 mL/min (ref >60.00)
Glucose, Bld: 128 mg/dL — ABNORMAL HIGH (ref 70–99)
Potassium: 3.9 mEq/L (ref 3.5–5.1)
Sodium: 140 mEq/L (ref 135–145)

## 2011-03-14 MED ORDER — FUROSEMIDE 40 MG PO TABS: 60.0000 mg | ORAL_TABLET | Freq: Two times a day (BID) | ORAL | Status: DC

## 2011-03-14 NOTE — Patient Instructions (Signed)
Change Furosemide to 60 mg (1 & 1/2 tabs) Twice daily   Labs today (bmet, bnp)  Your physician has recommended that you wear a holter monitor. Holter monitors are medical devices that record the heart's electrical activity. Doctors most often use these monitors to diagnose arrhythmias. Arrhythmias are problems with the speed or rhythm of the heartbeat. The monitor is a small, portable device. You can wear one while you do your normal daily activities. This is usually used to diagnose what is causing palpitations/syncope (passing out).  Your physician has requested that you have en exercise stress myoview. For further information please visit [email or web address] Please follow instruction sheet, as given.  Your physician recommends that you schedule a follow-up appointment in: 2-3 weeks after test

## 2011-03-15 DIAGNOSIS — I5032 Chronic diastolic (congestive) heart failure: Secondary | ICD-10-CM | POA: Insufficient documentation

## 2011-03-15 DIAGNOSIS — R079 Chest pain, unspecified: Secondary | ICD-10-CM | POA: Insufficient documentation

## 2011-03-15 NOTE — Assessment & Plan Note (Signed)
Patient has both exertional and nonexertional chest pain.  Will get ETT-myoview to assess for ischemia.

## 2011-03-15 NOTE — Progress Notes (Signed)
PCP: Deborah [name] is a 60 y.o. female with a history of rheumatic heart disease status post mitral valve replacement and tricuspid valve repair at [facility] in 2002 as well as permanent atrial fibrillation on chronic anticoagulation therapy.  Most recent echocardiogram showed that LV function was normal and MV prosthesis appeared normal.  She had significant bi-atrial enlargement as well as moderate to severe tricuspid regurgitation and mildly elevated pulmonary artery systolic pressures.  At last appointment, she described increased exertional dyspnea, chest pain with activity, and palpitations with associated dizziness.  She was set up for a holter monitor and a myoview but cancelled them.    After last appointment, Lasix was increased.  She does feel like she is breathing better.  She is still short of breath after walking about 100 feet and climbing steps though this is improved.  She continues to get chest pain with and without exertion, on average twice a week.   She continues to get episodes of palpitations where she feels her heart race.  She also has occasional non-positional lightheadedness.   Labs (5/12): LDL 61, HDL 59 Labs (6/12): K 3.9, creatinine 1.0, BNP 129  Past Medical History  Diagnosis Date  . Rheumatic heart disease     Status post mechanical mitral valve replacement and tricuspid valve repair at [facility] in 2002; echo 5/12:   EF 60-65%, mild AI, mitral valve prosthesis with AVA 1.66, severe LAE, moderate RAE, moderate to severe TR, mild increased pulmonary artery systolic pressure;    Adenosine Cardiolite in 4/09:   No ischemia, EF 66%  . Chronic atrial fibrillation   . Chronic anticoagulation   . Fatigue   . Borderline diabetes   . History of mitral valve replacement   . Hyperlipidemia   . Obesity   . Anemia   . Palpitations   . Headache   . Gout   . Hypertension   . Carpal tunnel syndrome   . External hemorrhoids   . Rotator cuff syndrome     Current  Outpatient Prescriptions  Medication Sig Dispense Refill  . acetaminophen (TYLENOL) 325 MG tablet Take 650 mg by mouth every 6 (six) hours as needed.        Marland Kitchen aspirin 81 MG tablet Take 81 mg by mouth daily.        Marland Kitchen COUMADIN 2 MG tablet TAKE AS DIRECTED  36 tablet  10  . folic acid (FOLVITE) 1 MG tablet Take 1 mg by mouth daily.        . furosemide (LASIX) 40 MG tablet Take 1.5 tablets (60 mg total) by mouth 2 (two) times daily.      . metoprolol (TOPROL-XL) 100 MG 24 hr tablet Take 0.5 tablets (50 mg total) by mouth daily.      Marland Kitchen omeprazole (PRILOSEC) 20 MG capsule Take 20 mg by mouth daily.        . potassium chloride SA (K-DUR,KLOR-CON) 20 MEQ tablet Take 10 mEq by mouth 3 (three) times daily.       . rosuvastatin (CRESTOR) 5 MG tablet Take 5 mg by mouth every other day.          Allergies: Allergies  Allergen Reactions  . Phenergan   . Promethazine Hcl     REACTION: made her real shaky    Social history:  Non smoker.  ROS:  All systems reviewed and negative except as per HPI.   Vital Signs: BP 131/74  Pulse 82  Ht 4\' 8"  (1.422 m)  Wt 161 lb (73.029 kg)  BMI 36.10 kg/m2  PHYSICAL EXAM: Well nourished, well developed, in no acute distress HEENT: normal Neck: Neck veins elevated at 10 cm Cardiac:  Mechanical S1, normal S2, irreg irreg rhythm, 2/6 HSM LLSB Lungs:  clear to auscultation bilaterally, no wheezing, rhonchi or rales Abd: soft, nontender, no hepatomegaly Ext: no edema

## 2011-03-15 NOTE — Assessment & Plan Note (Signed)
Goal INR 2.5-3.5 with mechanical mitral valve.  She is also on ASA 81 mg daily.

## 2011-03-15 NOTE — Assessment & Plan Note (Signed)
Patient appears volume overloaded on exam, still with NYHA class III symptoms.  EF preserved on last echo.  I will change Lasix to 60 mg po bid.  BMET/BNP today.

## 2011-03-15 NOTE — Assessment & Plan Note (Signed)
Continue Toprol XL.  Will get 48 hour holter monitor to assess for high rate events as the cause of her palpitations and also to rule out bradycardic events given her periodic lightheadedness.

## 2011-03-19 ENCOUNTER — Telehealth: Payer: Self-pay | Admitting: Cardiology

## 2011-03-19 NOTE — Telephone Encounter (Signed)
[phone] latest NUC

## 2011-03-20 ENCOUNTER — Ambulatory Visit (INDEPENDENT_AMBULATORY_CARE_PROVIDER_SITE_OTHER): Payer: Medicaid Other | Admitting: *Deleted

## 2011-03-20 ENCOUNTER — Ambulatory Visit (HOSPITAL_COMMUNITY): Payer: Medicaid Other | Attending: Cardiology | Admitting: Radiology

## 2011-03-20 ENCOUNTER — Encounter (INDEPENDENT_AMBULATORY_CARE_PROVIDER_SITE_OTHER): Payer: Medicaid Other

## 2011-03-20 VITALS — Ht <= 58 in | Wt 159.0 lb

## 2011-03-20 DIAGNOSIS — R002 Palpitations: Secondary | ICD-10-CM

## 2011-03-20 DIAGNOSIS — Z7901 Long term (current) use of anticoagulants: Secondary | ICD-10-CM

## 2011-03-20 DIAGNOSIS — R0609 Other forms of dyspnea: Secondary | ICD-10-CM

## 2011-03-20 DIAGNOSIS — I059 Rheumatic mitral valve disease, unspecified: Secondary | ICD-10-CM

## 2011-03-20 DIAGNOSIS — Z954 Presence of other heart-valve replacement: Secondary | ICD-10-CM

## 2011-03-20 DIAGNOSIS — R079 Chest pain, unspecified: Secondary | ICD-10-CM | POA: Insufficient documentation

## 2011-03-20 MED ORDER — TECHNETIUM TC 99M TETROFOSMIN IV KIT
11.0000 | PACK | Freq: Once | INTRAVENOUS | Status: AC | PRN
  Administered 2011-03-20: 11 via INTRAVENOUS

## 2011-03-20 MED ORDER — TECHNETIUM TC 99M TETROFOSMIN IV KIT
33.0000 | PACK | Freq: Once | INTRAVENOUS | Status: AC | PRN
  Administered 2011-03-20: 33 via INTRAVENOUS

## 2011-03-20 NOTE — Progress Notes (Addendum)
[facility] SITE 3 NUCLEAR MED [address] [place] [address] [phone]  Cardiology Nuclear Med Study  Deborah [name] is a 60 y.o. female [id number] 05/22/1951   Nuclear Med Background Indication for Stress Test:  Evaluation for Ischemia History:  '02 MVR; '09 MPS:No ischemia, EF=66%; 5/12 Echo:EF=60-65%; H/O Atrial Fibrillation Cardiac Risk Factors: Family History - CAD, Hypertension, Lipids and Obesity  Symptoms:  Chest Pain with and without Exertion (last episode of chest discomfort was about 3-weeks ago), Diaphoresis, DOE/SOB, Fatigue, Light-Headedness, Nausea/Vomiting and Palpitations   Nuclear Pre-Procedure Caffeine/Decaff Intake:  None NPO After: 8:00pm   Lungs:  Clear.   IV 0.9% NS with Angio Cath:  22g  IV Site: R Forearm  IV Started by:  Irean Hong, RN  Chest Size (in):  36 Cup Size: B  Height: 4\' 8"  (1.422 m)  Weight:  159 lb (72.122 kg)  BMI:  Body mass index is 35.65 kg/(m^2). Tech Comments:  Held metoprolol x 24 hrs    Nuclear Med Study 1 or 2 day study: 1 day  Stress Test Type:  Stress  Reading MD: Charlton Haws, MD  Order Authorizing Provider:  Marca Ancona, MD  Resting Radionuclide: Technetium 6m Tetrofosmin  Resting Radionuclide Dose: 11.0 mCi   Stress Radionuclide:  Technetium 11m Tetrofosmin  Stress Radionuclide Dose: 33.0 mCi           Stress Protocol Rest HR: 74 Stress HR: 176  Rest BP: 129/80 Stress BP: 174/90  Exercise Time (min): 4:30 METS: 6.4   Predicted Max HR: 162 bpm % Max HR: 108.64 bpm Rate Pressure Product: 21308   Dose of Adenosine (mg):  n/a Dose of Lexiscan: n/a mg  Dose of Atropine (mg): n/a Dose of Dobutamine: n/a mcg/kg/min (at max HR)  Stress Test Technologist: Smiley Houseman, CMA-N  Nuclear Technologist:  Domenic Polite, CNMT     Rest Procedure:  Myocardial perfusion imaging was performed at rest 45 minutes following the intravenous administration of Technetium 72m Tetrofosmin.  Rest ECG:  Atrial Fibrilliation  Stress Procedure:  The patient exercised for 4:30 on the treadmill utilizing the Bruce protocol.  The patient stopped due to fatigue.  She did c/o chest pressure, 8/10, with exercise.  There were no diagnostic ST-T wave changes.  There were occasional aberrant beats.  Technetium 99 Thermoforming was injected at peak exercise and myocardial perfusion imaging was performed after a brief delay.  Stress ECG: No significant change from baseline ECG  QP'S Raw Data Images:  Normal; no motion artifact; normal heart/lung ratio. Stress Images:  Normal homogeneous uptake in all areas of the myocardium. Rest Images:  Normal homogeneous uptake in all areas of the myocardium. Subtraction (SD'S):  Normal Transient Ischemic Dilatation (Normal <1.22):  1.04 Lung/Heart Ratio (Normal <0.45):  0.28  Quantitative Gated Sect Images QS EDV:  60 ml QS LVI:  18 ml QS cine images:  NL LV Function; NL Wall Motion QS EF: 69%  Impression Exercise Capacity:  Poor exercise capacity. BP Response:  Normal blood pressure response. Clinical Symptoms:  There is dyspnea. ECG Impression:  Baseline ECG shows atrial fibrillation Comparison with Prior Nuclear Study: No images to compare  Overall Impression:  Normal stress nuclear study.       Charlton Haws  Normal study.  [employer]

## 2011-03-21 ENCOUNTER — Encounter: Payer: Medicaid Other | Admitting: *Deleted

## 2011-03-21 NOTE — Progress Notes (Signed)
Pt notified of results

## 2011-03-21 NOTE — Progress Notes (Signed)
nuc med report routed to Dr.McLean 03/21/11 Deborah [name]

## 2011-03-22 ENCOUNTER — Encounter: Payer: Self-pay | Admitting: Cardiology

## 2011-03-26 ENCOUNTER — Ambulatory Visit (INDEPENDENT_AMBULATORY_CARE_PROVIDER_SITE_OTHER): Payer: Medicaid Other | Admitting: Cardiology

## 2011-03-26 ENCOUNTER — Encounter: Payer: Self-pay | Admitting: Cardiology

## 2011-03-26 DIAGNOSIS — R079 Chest pain, unspecified: Secondary | ICD-10-CM

## 2011-03-26 DIAGNOSIS — I482 Chronic atrial fibrillation, unspecified: Secondary | ICD-10-CM

## 2011-03-26 DIAGNOSIS — I4891 Unspecified atrial fibrillation: Secondary | ICD-10-CM

## 2011-03-26 DIAGNOSIS — I5032 Chronic diastolic (congestive) heart failure: Secondary | ICD-10-CM

## 2011-03-26 DIAGNOSIS — I099 Rheumatic heart disease, unspecified: Secondary | ICD-10-CM

## 2011-03-26 DIAGNOSIS — E785 Hyperlipidemia, unspecified: Secondary | ICD-10-CM

## 2011-03-26 DIAGNOSIS — I509 Heart failure, unspecified: Secondary | ICD-10-CM

## 2011-03-26 NOTE — Progress Notes (Signed)
PCP: Deborah [name] is a 60 y.o. female with a history of rheumatic heart disease status post mitral valve replacement and tricuspid valve repair at [facility] in 2002 as well as permanent atrial fibrillation on chronic anticoagulation therapy.  Most recent echocardiogram showed that LV function was normal and MV prosthesis appeared normal.  She had significant bi-atrial enlargement as well as moderate to severe tricuspid regurgitation and mildly elevated pulmonary artery systolic pressures.  At a recent appointment, she described increased exertional dyspnea, chest pain with activity, and palpitations with associated dizziness.    I increased her Lasix to 60 mg bid at last appointment, and BNP has decreased.  ETT-myoview was a normal study.  She is still wearing her event monitor but so far it only shows her chronic atrial fibrillation with no high rate events.  She is breathing better since increasing Lasix.  She is able to walk around her house without dyspnea.  She is no longer having chest pain episodes.    Labs (5/12): LDL 61, HDL 59 Labs (6/12): K 3.9, creatinine 1.0, BNP 129 Labs (7/12): K 3.9, creatinine 0.9, BNP 73  Past Medical History  Diagnosis Date  . Rheumatic heart disease     Status post mechanical mitral valve replacement and tricuspid valve repair at [facility] in 2002; echo 5/12:   EF 60-65%, mild AI, mitral valve prosthesis with AVA 1.66, severe LAE, moderate RAE, moderate to severe TR, mild increased pulmonary artery systolic pressure;    Adenosine Cardiolite in 4/09:   No ischemia, EF 66%  . Chronic atrial fibrillation   . Chronic anticoagulation   . Fatigue   . Borderline diabetes   . History of mitral valve replacement   . Hyperlipidemia   . Obesity   . Anemia   . Palpitations   . Headache   . Gout   . Hypertension   . Carpal tunnel syndrome   . External hemorrhoids   . Rotator cuff syndrome     Current Outpatient Prescriptions  Medication Sig Dispense  Refill  . acetaminophen (TYLENOL) 325 MG tablet Take 650 mg by mouth every 6 (six) hours as needed.        Marland Kitchen aspirin 81 MG tablet Take 81 mg by mouth daily.        Marland Kitchen COUMADIN 2 MG tablet TAKE AS DIRECTED  36 tablet  10  . folic acid (FOLVITE) 1 MG tablet Take 1 mg by mouth daily.        . furosemide (LASIX) 40 MG tablet Take 1.5 tablets (60 mg total) by mouth 2 (two) times daily.      . metoprolol (TOPROL-XL) 100 MG 24 hr tablet Take 0.5 tablets (50 mg total) by mouth daily.      Marland Kitchen omeprazole (PRILOSEC) 20 MG capsule Take 20 mg by mouth daily.        . potassium chloride SA (K-DUR,KLOR-CON) 20 MEQ tablet Take 10 mEq by mouth 3 (three) times daily.       . rosuvastatin (CRESTOR) 5 MG tablet Take 5 mg by mouth every other day.          Allergies: Allergies  Allergen Reactions  . Phenergan   . Promethazine Hcl     REACTION: made her real shaky    Social history:  Non smoker.  ROS:  All systems reviewed and negative except as per HPI.   Vital Signs: BP 116/70  Pulse 68  Ht 4\' 11"  (1.499 m)  Wt 161 lb (73.029  kg)  BMI 32.52 kg/m2  PHYSICAL EXAM: Well nourished, well developed, in no acute distress HEENT: normal Neck: Neck veins not elevated Cardiac:  Mechanical S1, normal S2, irreg irreg rhythm, 2/6 HSM LLSB Lungs:  clear to auscultation bilaterally, no wheezing, rhonchi or rales Abd: soft, nontender, no hepatomegaly Ext: no edema

## 2011-03-26 NOTE — Assessment & Plan Note (Signed)
LDL has been under good control on current statin dose.

## 2011-03-26 NOTE — Assessment & Plan Note (Signed)
No ischemia or infarction on myoview.  No further chest pain.

## 2011-03-26 NOTE — Assessment & Plan Note (Addendum)
Continue Toprol XL.  No significant tachycardia on event monitor so far to explain her occasional sense of pounding heart rate. She will complete the 3 week monitor.  Continue coumadin.

## 2011-03-26 NOTE — Assessment & Plan Note (Signed)
EF preserved on last echo.  After increase in Lasix to 60 mg bid, she became symptomatically much better, now NYHA class II probably.  Continue current Lasix and KCl doses.

## 2011-03-26 NOTE — Patient Instructions (Signed)
Schedule an appointment to see Dr [name] in 2 months.

## 2011-03-26 NOTE — Assessment & Plan Note (Signed)
Goal INR 2.5-3.5 with mechanical mitral valve.  She is also on ASA 81 mg daily.  

## 2011-03-30 ENCOUNTER — Telehealth: Payer: Self-pay | Admitting: *Deleted

## 2011-03-30 NOTE — Telephone Encounter (Signed)
Dr [name] received and reviewed event episodes 03/19/1210:28am CST  03/20/11  11:25am CST  03/20/11 1:38PM CST 03/20/11 8:07pm CST 03/21/11 6:13pm CST 03/24/11 7:08am CST no new recommendations based on these episodes

## 2011-04-17 ENCOUNTER — Encounter: Payer: Medicaid Other | Admitting: *Deleted

## 2011-04-19 ENCOUNTER — Other Ambulatory Visit: Payer: Self-pay | Admitting: Cardiology

## 2011-04-20 ENCOUNTER — Ambulatory Visit (INDEPENDENT_AMBULATORY_CARE_PROVIDER_SITE_OTHER): Payer: Medicaid Other | Admitting: *Deleted

## 2011-04-20 DIAGNOSIS — Z954 Presence of other heart-valve replacement: Secondary | ICD-10-CM

## 2011-04-20 DIAGNOSIS — Z7901 Long term (current) use of anticoagulants: Secondary | ICD-10-CM

## 2011-04-20 DIAGNOSIS — I059 Rheumatic mitral valve disease, unspecified: Secondary | ICD-10-CM

## 2011-04-20 LAB — POCT INR: INR: 3.2

## 2011-04-25 ENCOUNTER — Other Ambulatory Visit: Payer: Self-pay | Admitting: Oncology

## 2011-04-25 ENCOUNTER — Encounter (HOSPITAL_BASED_OUTPATIENT_CLINIC_OR_DEPARTMENT_OTHER): Payer: Medicaid Other | Admitting: Oncology

## 2011-04-25 DIAGNOSIS — D649 Anemia, unspecified: Secondary | ICD-10-CM

## 2011-04-25 DIAGNOSIS — I10 Essential (primary) hypertension: Secondary | ICD-10-CM

## 2011-04-25 DIAGNOSIS — Z1231 Encounter for screening mammogram for malignant neoplasm of breast: Secondary | ICD-10-CM

## 2011-04-25 DIAGNOSIS — D582 Other hemoglobinopathies: Secondary | ICD-10-CM

## 2011-04-25 DIAGNOSIS — E876 Hypokalemia: Secondary | ICD-10-CM

## 2011-04-25 LAB — IRON AND TIBC
%SAT: 24 % (ref 20–55)
Iron: 73 ug/dL (ref 42–145)
UIBC: 236 ug/dL (ref 125–400)

## 2011-04-25 LAB — COMPREHENSIVE METABOLIC PANEL
ALT: 19 U/L (ref 0–35)
CO2: 27 mEq/L (ref 19–32)
Calcium: 9.3 mg/dL (ref 8.4–10.5)
Chloride: 103 mEq/L (ref 96–112)
Creatinine, Ser: 0.98 mg/dL (ref 0.50–1.10)
Glucose, Bld: 122 mg/dL — ABNORMAL HIGH (ref 70–99)
Total Bilirubin: 1.3 mg/dL — ABNORMAL HIGH (ref 0.3–1.2)
Total Protein: 8.1 g/dL (ref 6.0–8.3)

## 2011-04-25 LAB — CBC & DIFF AND RETIC
BASO%: 0.5 % (ref 0.0–2.0)
HCT: 37.2 % (ref 34.8–46.6)
Immature Retic Fract: 15.5 % — ABNORMAL HIGH (ref 1.60–10.00)
MCHC: 32.5 g/dL (ref 31.5–36.0)
MONO#: 0.6 10*3/uL (ref 0.1–0.9)
NEUT%: 47.2 % (ref 38.4–76.8)
RDW: 14.3 % (ref 11.2–14.5)
Retic %: 2.45 % — ABNORMAL HIGH (ref 0.70–2.10)
Retic Ct Abs: 127.65 10*3/uL — ABNORMAL HIGH (ref 33.70–90.70)
WBC: 6.3 10*3/uL (ref 3.9–10.3)
lymph#: 2.5 10*3/uL (ref 0.9–3.3)

## 2011-05-03 ENCOUNTER — Telehealth: Payer: Self-pay | Admitting: *Deleted

## 2011-05-03 MED ORDER — FUROSEMIDE 40 MG PO TABS: 60.0000 mg | ORAL_TABLET | Freq: Two times a day (BID) | ORAL | Status: DC

## 2011-05-03 NOTE — Telephone Encounter (Signed)
Dr [name] reviewed monitor 03/20/11-04/18/11 atrial fibrillation with reasonable rate control. No worrisome high rate events. --I discussed results with pt.

## 2011-05-18 ENCOUNTER — Ambulatory Visit (INDEPENDENT_AMBULATORY_CARE_PROVIDER_SITE_OTHER): Payer: Medicaid Other | Admitting: *Deleted

## 2011-05-18 DIAGNOSIS — Z954 Presence of other heart-valve replacement: Secondary | ICD-10-CM

## 2011-05-18 DIAGNOSIS — I059 Rheumatic mitral valve disease, unspecified: Secondary | ICD-10-CM

## 2011-05-18 DIAGNOSIS — Z7901 Long term (current) use of anticoagulants: Secondary | ICD-10-CM

## 2011-05-22 DIAGNOSIS — I970 Postcardiotomy syndrome: Secondary | ICD-10-CM | POA: Insufficient documentation

## 2011-05-22 DIAGNOSIS — Z9889 Other specified postprocedural states: Secondary | ICD-10-CM | POA: Insufficient documentation

## 2011-05-28 ENCOUNTER — Ambulatory Visit (INDEPENDENT_AMBULATORY_CARE_PROVIDER_SITE_OTHER): Payer: Medicaid Other | Admitting: Cardiology

## 2011-05-28 ENCOUNTER — Encounter: Payer: Self-pay | Admitting: Cardiology

## 2011-05-28 DIAGNOSIS — R0602 Shortness of breath: Secondary | ICD-10-CM

## 2011-05-28 DIAGNOSIS — I482 Chronic atrial fibrillation, unspecified: Secondary | ICD-10-CM

## 2011-05-28 DIAGNOSIS — I5032 Chronic diastolic (congestive) heart failure: Secondary | ICD-10-CM

## 2011-05-28 DIAGNOSIS — I509 Heart failure, unspecified: Secondary | ICD-10-CM

## 2011-05-28 DIAGNOSIS — I4891 Unspecified atrial fibrillation: Secondary | ICD-10-CM

## 2011-05-28 DIAGNOSIS — Z8679 Personal history of other diseases of the circulatory system: Secondary | ICD-10-CM

## 2011-05-28 LAB — BRAIN NATRIURETIC PEPTIDE: Pro B Natriuretic peptide (BNP): 116 pg/mL — ABNORMAL HIGH (ref 0.0–100.0)

## 2011-05-28 LAB — BASIC METABOLIC PANEL
CO2: 29 mEq/L (ref 19–32)
Calcium: 9.1 mg/dL (ref 8.4–10.5)
Chloride: 100 mEq/L (ref 96–112)
Sodium: 139 mEq/L (ref 135–145)

## 2011-05-28 NOTE — Patient Instructions (Signed)
Your physician recommends that you have lab work today--BMP/BNP  Your physician wants you to follow-up in: 3 months with Dr [name].(January 2013) you will receive a reminder letter in the mail two months in advance. If you don't receive a letter, please call our office to schedule the follow-up appointment.

## 2011-05-28 NOTE — Assessment & Plan Note (Signed)
Goal INR 2.5-3.5 with mechanical mitral valve.  She is also on ASA 81 mg daily.  

## 2011-05-28 NOTE — Assessment & Plan Note (Signed)
EF preserved on last echo.  After increase in Lasix to 60 mg bid, she became symptomatically much better, now NYHA class II probably.  Weight has been stable. Continue current Lasix and KCl doses.  BMET/BNP today.   Followup in 3 months.

## 2011-05-28 NOTE — Assessment & Plan Note (Signed)
Continue Toprol XL and coumadin.  No significant tachycardia on event monitor to explain her occasional sense of pounding heart rate.

## 2011-05-28 NOTE — Progress Notes (Signed)
PCP: [facility]  60 yo with rheumatic heart disease and MV replacement/TV repair, chronic atrial fibrillation, and chronic diastolic CHF presents for cardiology followup.  Patient continues to have episodes where she feels her heart race.  We did a 3 week event monitor that showed persistent atrial fibrillation with no significant high rate events.  Weight is stable. Patient is walking up to 45 minutes 3-4 times a week.  She is not short of breath now walking on flat ground.  She is short of breath going up steps or an incline.  No chest pain.    ECG:  Atrial fibrillation at 66  Labs (8/12): K 3.5, creatinine 0.98  PMH: 1. Rheumatic heart disease: Status post mechanical MV replacement and tricuspid repair at [facility] in 2002.  Last echo 5/12 with EF 60-65%, mild AI, mechanical MV prosthesis with normal function, severe LAE, moderate RAE, moderate to severe TR, mildly elevated PA systolic pressure.  2. Chronic atrial fibrillation on coumadin.  Event montor (7/12-8/12) showed no high rate episodes (patient continuously in atrial fibrillation).  3. Chronic diastolic CHF 4. ETT-myoview (7/12): No evidence for ischemia or infarction.  5. Impaired fasting glucose 6. Hyperlipidemia  7. HTN 8. Gout 9. Anemia 10. External hemorrhoids.   SH: Nonsmoker.   FH: No premature CAD  Current Outpatient Prescriptions  Medication Sig Dispense Refill  . acetaminophen (TYLENOL) 325 MG tablet Take 650 mg by mouth every 6 (six) hours as needed.        Marland Kitchen aspirin 81 MG tablet Take 81 mg by mouth daily.        Marland Kitchen COUMADIN 2 MG tablet TAKE AS DIRECTED  36 tablet  10  . CRESTOR 5 MG tablet TAKE 1 TABLET EVERY DAY  30 tablet  3  . folic acid (FOLVITE) 1 MG tablet Take 1 mg by mouth daily.        . furosemide (LASIX) 40 MG tablet Take 1.5 tablets (60 mg total) by mouth 2 (two) times daily.  90 tablet  6  . metoprolol (TOPROL-XL) 100 MG 24 hr tablet Take 0.5 tablets (50 mg total) by mouth daily.      Marland Kitchen omeprazole  (PRILOSEC) 20 MG capsule Take 20 mg by mouth daily.        . potassium chloride SA (K-DUR,KLOR-CON) 20 MEQ tablet Take 10 mEq by mouth 3 (three) times daily.         BP 122/72  Pulse 66  Ht 4\' 8"  (1.422 m)  Wt 161 lb (73.029 kg)  BMI 36.10 kg/m2 General: NAD Neck: No JVD, no thyromegaly or thyroid nodule.  Lungs: Clear to auscultation bilaterally with normal respiratory effort. CV: Nondisplaced PMI.  Heart irregular S1/S2, no S3/S4, mechanical S2, 2/6 HSM LLSB.  No peripheral edema.  No carotid bruit.  Normal pedal pulses.  Abdomen: Soft, nontender, no hepatosplenomegaly, no distention.  Neurologic: Alert and oriented x 3.  Psych: Normal affect. Extremities: No clubbing or cyanosis.

## 2011-06-15 ENCOUNTER — Ambulatory Visit (INDEPENDENT_AMBULATORY_CARE_PROVIDER_SITE_OTHER): Payer: Medicaid Other | Admitting: *Deleted

## 2011-06-15 DIAGNOSIS — I059 Rheumatic mitral valve disease, unspecified: Secondary | ICD-10-CM

## 2011-06-15 DIAGNOSIS — Z954 Presence of other heart-valve replacement: Secondary | ICD-10-CM

## 2011-06-15 DIAGNOSIS — Z7901 Long term (current) use of anticoagulants: Secondary | ICD-10-CM

## 2011-06-15 LAB — POCT INR: INR: 2.8

## 2011-06-27 ENCOUNTER — Other Ambulatory Visit: Payer: Self-pay | Admitting: Cardiology

## 2011-07-04 ENCOUNTER — Other Ambulatory Visit: Payer: Self-pay | Admitting: Cardiology

## 2011-07-24 ENCOUNTER — Telehealth: Payer: Self-pay | Admitting: *Deleted

## 2011-07-24 NOTE — Telephone Encounter (Signed)
I received a fax that Crestor required prior authorization by [facility]. I talked with Cyndia Bent at [facility] prior auth [id number]. Crestor was placed on preferred drug list since June 2012. I received authorization  for crestor 5mg  daily for pt for 30 days. Pt must try and fail  2 preferred drug medications--- atorvastatin.pravastatin.simvastatin-- before Crestor will be approved for longer than 30 days. I will forward to Dr [name] for review and recommendations.

## 2011-07-24 NOTE — Telephone Encounter (Signed)
She can take atorvastatin 20 mg daily

## 2011-07-27 ENCOUNTER — Ambulatory Visit (INDEPENDENT_AMBULATORY_CARE_PROVIDER_SITE_OTHER): Payer: Medicaid Other | Admitting: *Deleted

## 2011-07-27 DIAGNOSIS — Z954 Presence of other heart-valve replacement: Secondary | ICD-10-CM

## 2011-07-27 DIAGNOSIS — Z7901 Long term (current) use of anticoagulants: Secondary | ICD-10-CM

## 2011-07-27 DIAGNOSIS — I059 Rheumatic mitral valve disease, unspecified: Secondary | ICD-10-CM

## 2011-07-27 LAB — POCT INR: INR: 3.5

## 2011-07-27 MED ORDER — ATORVASTATIN CALCIUM 20 MG PO TABS: 20.0000 mg | ORAL_TABLET | Freq: Every day | ORAL | Status: DC

## 2011-07-27 NOTE — Telephone Encounter (Signed)
I talked with pt's daughter, Inetta Fermo. She is aware pt will need to change to atorvastatin 20mg  daily instead of Crestor. Pt's daughter states she will discuss with pt.

## 2011-08-14 ENCOUNTER — Encounter (HOSPITAL_COMMUNITY): Payer: Self-pay | Admitting: Emergency Medicine

## 2011-08-14 ENCOUNTER — Emergency Department (INDEPENDENT_AMBULATORY_CARE_PROVIDER_SITE_OTHER)
Admission: EM | Admit: 2011-08-14 | Discharge: 2011-08-14 | Disposition: A | Discharge status: Home / Self Care | Payer: Medicaid Other | Source: Home / Self Care | Attending: Emergency Medicine | Admitting: Emergency Medicine

## 2011-08-14 DIAGNOSIS — J40 Bronchitis, not specified as acute or chronic: Secondary | ICD-10-CM

## 2011-08-14 MED ORDER — ALBUTEROL SULFATE HFA 108 (90 BASE) MCG/ACT IN AERS: 1.0000 | INHALATION_SPRAY | Freq: Four times a day (QID) | RESPIRATORY_TRACT | Status: DC | PRN

## 2011-08-14 MED ORDER — CEPHALEXIN 500 MG PO CAPS: 500.0000 mg | ORAL_CAPSULE | Freq: Three times a day (TID) | ORAL | Status: AC

## 2011-08-14 MED ORDER — BENZONATATE 200 MG PO CAPS: 200.0000 mg | ORAL_CAPSULE | Freq: Three times a day (TID) | ORAL | Status: AC | PRN

## 2011-08-14 NOTE — ED Provider Notes (Signed)
History     CSN: [id number] Arrival date & time: 08/14/2011  1:53 PM   First MD Initiated Contact with Patient 08/14/11 1445      Chief Complaint  Patient presents with  . Cough    (Consider location/radiation/quality/duration/timing/severity/associated sxs/prior treatment) HPI Comments: The patient is a 60 year old female with a history of tricuspid regurgitation, status post mechanical mitral valve replacement, chronic atrial fibrillation, chronic anticoagulation, diabetes, and hyperlipidemia who now has had a one and a half week history of cough productive of white to green sputum. She has occasional wheezing. She denies chest pain or shortness of breath. She does have some pain in her upper back, hoarseness, sore throat, and scratchy throat. He denies any fevers, chills, sweats, or aches. No headache, nasal congestion, or rhinorrhea.  Patient is a 60 y.o. female presenting with cough.  Cough Associated symptoms include sore throat and wheezing. Pertinent negatives include no chills, no ear pain, no rhinorrhea, no shortness of breath and no eye redness.    Past Medical History  Diagnosis Date  . Rheumatic heart disease     Status post mechanical mitral valve replacement and tricuspid valve repair at [facility] in 2002; echo 5/12:   EF 60-65%, mild AI, mitral valve prosthesis with AVA 1.66, severe LAE, moderate RAE, moderate to severe TR, mild increased pulmonary artery systolic pressure;    Adenosine Cardiolite in 4/09:   No ischemia, EF 66%  . Chronic atrial fibrillation   . Chronic anticoagulation   . Fatigue   . Borderline diabetes   . History of mitral valve replacement   . Hyperlipidemia   . Obesity   . Anemia   . Palpitations   . Headache   . Gout   . Hypertension   . Carpal tunnel syndrome   . External hemorrhoids   . Rotator cuff syndrome     Past Surgical History  Procedure Date  . Tricuspid valve repair   . Cardiac catheterization     EF 55%    Family History    Problem Relation Age of Onset  . Stroke Mother   . Diabetes Mother   . Heart failure Mother   . Heart disease Father     "heart problems"  . Heart disease Sister     cardiac problems  . Heart disease Brother   . Gout Brother   . Diabetes Brother   . Stroke Brother     History  Substance Use Topics  . Smoking status: Never Smoker   . Smokeless tobacco: Never Used  . Alcohol Use: No    OB History    Grav Para Term Preterm Abortions TAB SAB Ect Mult Living                  Review of Systems  Constitutional: Negative for fever, chills and fatigue.  HENT: Positive for sore throat and voice change. Negative for ear pain, congestion, rhinorrhea, sneezing, neck stiffness and postnasal drip.   Eyes: Negative for pain, discharge and redness.  Respiratory: Positive for cough and wheezing. Negative for chest tightness and shortness of breath.   Gastrointestinal: Negative for nausea, vomiting, abdominal pain and diarrhea.  Skin: Negative for rash.    Allergies  Phenergan and Promethazine hcl  Home Medications   Current Outpatient Rx  Name Route Sig Dispense Refill  . OVER THE COUNTER MEDICATION  Patient has been taking over the counter cough medicine from pharmacy     . ROSUVASTATIN CALCIUM 5 MG PO TABS Oral  Take 5 mg by mouth daily.      . ACETAMINOPHEN 325 MG PO TABS Oral Take 650 mg by mouth every 6 (six) hours as needed.      . ALBUTEROL SULFATE HFA 108 (90 BASE) MCG/ACT IN AERS Inhalation Inhale 1-2 puffs into the lungs every 6 (six) hours as needed for wheezing. 1 Inhaler 0  . ASPIRIN 81 MG PO TABS Oral Take 81 mg by mouth daily.      . ATORVASTATIN CALCIUM 20 MG PO TABS Oral Take 1 tablet (20 mg total) by mouth daily. 30 tablet 3  . BENZONATATE 200 MG PO CAPS Oral Take 1 capsule (200 mg total) by mouth 3 (three) times daily as needed for cough. 30 capsule 0  . CEPHALEXIN 500 MG PO CAPS Oral Take 1 capsule (500 mg total) by mouth 3 (three) times daily. 30 capsule 0  .  COUMADIN 2 MG PO TABS  TAKE 1 TABLET BY MOUTH EVERY DAY OR AS DIRECTED 35 tablet 3    Dispense as written.  Marland Kitchen FOLIC ACID 1 MG PO TABS Oral Take 1 mg by mouth daily.      . FUROSEMIDE 40 MG PO TABS Oral Take 1.5 tablets (60 mg total) by mouth 2 (two) times daily. 90 tablet 6  . KLOR-CON 10 10 MEQ PO TBCR  TAKE 2 TABLETS BY MOUTH EVERY DAY AS DIRECTED 100 tablet 6  . METOPROLOL SUCCINATE ER 100 MG PO TB24 Oral Take 0.5 tablets (50 mg total) by mouth daily.    Marland Kitchen OMEPRAZOLE 20 MG PO CPDR Oral Take 20 mg by mouth daily.      Marland Kitchen POTASSIUM CHLORIDE CRYS CR 20 MEQ PO TBCR Oral Take 10 mEq by mouth 3 (three) times daily.       BP 127/58  Pulse 80  Temp(Src) 98.2 F (36.8 C) (Oral)  Resp 18  SpO2 100%  Physical Exam  Nursing note and vitals reviewed. Constitutional: She appears well-developed and well-nourished. No distress.  HENT:  Head: Normocephalic and atraumatic.  Right Ear: External ear normal.  Left Ear: External ear normal.  Nose: Nose normal.  Mouth/Throat: Oropharynx is clear and moist. No oropharyngeal exudate.  Eyes: Conjunctivae and EOM are normal. Pupils are equal, round, and reactive to light. Right eye exhibits no discharge. Left eye exhibits no discharge.  Neck: Normal range of motion. Neck supple.  Cardiovascular: Normal rate.  Exam reveals no gallop and no friction rub.   No murmur heard.      Rhythm was irregularly irregular. She does have loud clicking opening and closing heart sounds from her mechanical heart valve.  Pulmonary/Chest: Effort normal and breath sounds normal. No stridor. No respiratory distress. She has no wheezes. She has no rales. She exhibits no tenderness.  Lymphadenopathy:    She has no cervical adenopathy.  Skin: Skin is warm and dry. No rash noted. She is not diaphoretic.    ED Course  Procedures (including critical care time)  Labs Reviewed - No data to display No results found.   1. Bronchitis       MDM  She has bronchitis. She has  gotten good results in the past with benzonatate Perles, so I gave her a prescription for these as well as cephalexin since this will interact least with her Coumadin and also albuterol inhaler. She was instructed in its use.        Roque Lias, MD 08/14/11 [phone]

## 2011-08-14 NOTE — ED Notes (Signed)
Cough onset one week ago.

## 2011-08-15 ENCOUNTER — Telehealth: Payer: Self-pay | Admitting: Cardiology

## 2011-08-20 ENCOUNTER — Ambulatory Visit (INDEPENDENT_AMBULATORY_CARE_PROVIDER_SITE_OTHER): Payer: Medicaid Other | Admitting: *Deleted

## 2011-08-20 ENCOUNTER — Ambulatory Visit (HOSPITAL_COMMUNITY): Payer: Medicaid Other

## 2011-08-20 DIAGNOSIS — Z954 Presence of other heart-valve replacement: Secondary | ICD-10-CM

## 2011-08-20 DIAGNOSIS — I059 Rheumatic mitral valve disease, unspecified: Secondary | ICD-10-CM

## 2011-08-20 DIAGNOSIS — Z7901 Long term (current) use of anticoagulants: Secondary | ICD-10-CM

## 2011-08-20 LAB — POCT INR: INR: 3.1

## 2011-08-27 ENCOUNTER — Other Ambulatory Visit: Payer: Self-pay | Admitting: Cardiology

## 2011-08-27 ENCOUNTER — Other Ambulatory Visit: Payer: Self-pay | Admitting: Nurse Practitioner

## 2011-09-04 ENCOUNTER — Ambulatory Visit (HOSPITAL_COMMUNITY): Payer: Medicaid Other

## 2011-09-04 ENCOUNTER — Ambulatory Visit (HOSPITAL_COMMUNITY)
Admission: RE | Admit: 2011-09-04 | Discharge: 2011-09-04 | Disposition: A | Discharge status: Home / Self Care | Payer: Medicaid Other | Source: Ambulatory Visit | Attending: Oncology | Admitting: Oncology

## 2011-09-04 DIAGNOSIS — Z1231 Encounter for screening mammogram for malignant neoplasm of breast: Secondary | ICD-10-CM | POA: Insufficient documentation

## 2011-09-07 ENCOUNTER — Encounter: Payer: Medicaid Other | Admitting: *Deleted

## 2011-09-24 ENCOUNTER — Ambulatory Visit (INDEPENDENT_AMBULATORY_CARE_PROVIDER_SITE_OTHER): Payer: Medicaid Other | Admitting: Cardiology

## 2011-09-24 ENCOUNTER — Telehealth: Payer: Self-pay | Admitting: *Deleted

## 2011-09-24 ENCOUNTER — Other Ambulatory Visit: Payer: Self-pay | Admitting: *Deleted

## 2011-09-24 ENCOUNTER — Encounter: Payer: Self-pay | Admitting: Cardiology

## 2011-09-24 ENCOUNTER — Ambulatory Visit (INDEPENDENT_AMBULATORY_CARE_PROVIDER_SITE_OTHER): Payer: Medicaid Other | Admitting: Pharmacist

## 2011-09-24 DIAGNOSIS — Z954 Presence of other heart-valve replacement: Secondary | ICD-10-CM

## 2011-09-24 DIAGNOSIS — I5032 Chronic diastolic (congestive) heart failure: Secondary | ICD-10-CM

## 2011-09-24 DIAGNOSIS — E876 Hypokalemia: Secondary | ICD-10-CM

## 2011-09-24 DIAGNOSIS — Z7901 Long term (current) use of anticoagulants: Secondary | ICD-10-CM

## 2011-09-24 DIAGNOSIS — I482 Chronic atrial fibrillation, unspecified: Secondary | ICD-10-CM

## 2011-09-24 DIAGNOSIS — I509 Heart failure, unspecified: Secondary | ICD-10-CM

## 2011-09-24 DIAGNOSIS — R0989 Other specified symptoms and signs involving the circulatory and respiratory systems: Secondary | ICD-10-CM

## 2011-09-24 DIAGNOSIS — I4891 Unspecified atrial fibrillation: Secondary | ICD-10-CM

## 2011-09-24 DIAGNOSIS — I059 Rheumatic mitral valve disease, unspecified: Secondary | ICD-10-CM

## 2011-09-24 LAB — HEPATIC FUNCTION PANEL
ALT: 18 U/L (ref 0–35)
AST: 25 U/L (ref 0–37)
Albumin: 4.5 g/dL (ref 3.5–5.2)
Alkaline Phosphatase: 56 U/L (ref 39–117)
Bilirubin, Direct: 0.3 mg/dL (ref 0.0–0.3)
Total Bilirubin: 2.5 mg/dL — ABNORMAL HIGH (ref 0.3–1.2)
Total Protein: 8.6 g/dL — ABNORMAL HIGH (ref 6.0–8.3)

## 2011-09-24 LAB — BASIC METABOLIC PANEL WITH GFR
BUN: 18 mg/dL (ref 6–23)
CO2: 30 meq/L (ref 19–32)
Calcium: 9.5 mg/dL (ref 8.4–10.5)
Chloride: 103 meq/L (ref 96–112)
Creatinine, Ser: 0.8 mg/dL (ref 0.4–1.2)
GFR: 73.77 mL/min
Glucose, Bld: 101 mg/dL — ABNORMAL HIGH (ref 70–99)
Potassium: 3.3 meq/L — ABNORMAL LOW (ref 3.5–5.1)
Sodium: 142 meq/L (ref 135–145)

## 2011-09-24 LAB — BRAIN NATRIURETIC PEPTIDE: Pro B Natriuretic peptide (BNP): 111 pg/mL — ABNORMAL HIGH (ref 0.0–100.0)

## 2011-09-24 LAB — LIPID PANEL
LDL Cholesterol: 85 mg/dL (ref 0–99)
VLDL: 18 mg/dL (ref 0.0–40.0)

## 2011-09-24 MED ORDER — POTASSIUM CHLORIDE CRYS ER 20 MEQ PO TBCR: 20.0000 meq | EXTENDED_RELEASE_TABLET | Freq: Two times a day (BID) | ORAL | Status: DC

## 2011-09-24 NOTE — Progress Notes (Signed)
PCP: [facility], Dr. Philipp Deputy  61 yo with rheumatic heart disease and MV replacement/TV repair, chronic atrial fibrillation, and chronic diastolic CHF presents for cardiology followup.  Deborah [name] has not been having significant tachypalpitations recently (had been problem in the past).  Weight is down 2 lbs.   Patient is walking up to 45 minutes 3-4 times a week.  Deborah [name] is not short of breath now walking on flat ground.  Deborah [name] is short of breath going up steps or an incline.  No chest pain.    ECG:  Atrial fibrillation at 72  Labs (8/12): K 3.5, creatinine 0.98 Labs (10/12): K 3.8, creatinine 1.0, BNP 116  PMH: 1. Rheumatic heart disease: Status post mechanical MV replacement and tricuspid repair at [facility] in 2002.  Last echo 5/12 with EF 60-65%, mild AI, mechanical MV prosthesis with normal function, severe LAE, moderate RAE, moderate to severe TR, mildly elevated PA systolic pressure.  2. Chronic atrial fibrillation on coumadin.  Event montor (7/12-8/12) showed no high rate episodes (patient continuously in atrial fibrillation).  3. Chronic diastolic CHF 4. ETT-myoview (7/12): No evidence for ischemia or infarction.  5. Impaired fasting glucose 6. Hyperlipidemia  7. HTN 8. Gout 9. Anemia 10. External hemorrhoids.   SH: Nonsmoker.   FH: No premature CAD  Current Outpatient Prescriptions  Medication Sig Dispense Refill  . acetaminophen (TYLENOL) 325 MG tablet Take 650 mg by mouth every 6 (six) hours as needed.        Marland Kitchen allopurinol (ZYLOPRIM) 300 MG tablet Take 300 mg by mouth daily.      Marland Kitchen aspirin 81 MG tablet Take 81 mg by mouth daily.        Marland Kitchen COUMADIN 2 MG tablet TAKE 1 TABLET BY MOUTH EVERY DAY OR AS DIRECTED  35 tablet  3  . folic acid (FOLVITE) 1 MG tablet Take 1 mg by mouth daily.        . furosemide (LASIX) 40 MG tablet Take 1.5 tablets (60 mg total) by mouth 2 (two) times daily.  90 tablet  6  . KLOR-CON 10 10 MEQ CR tablet TAKE 2 TABLETS BY MOUTH EVERY DAY AS DIRECTED  100 tablet  6    . metoprolol (TOPROL-XL) 50 MG 24 hr tablet Take 1 tablet by mouth daily  30 tablet  6  . omeprazole (PRILOSEC) 20 MG capsule Take 20 mg by mouth daily.        . rosuvastatin (CRESTOR) 5 MG tablet Take 5 mg by mouth daily.        Marland Kitchen albuterol (PROVENTIL HFA;VENTOLIN HFA) 108 (90 BASE) MCG/ACT inhaler Inhale 1-2 puffs into the lungs every 6 (six) hours as needed for wheezing.  1 Inhaler  0  . OVER THE COUNTER MEDICATION Patient has been taking over the counter cough medicine from pharmacy         BP 118/82  Pulse 72  Ht 4\' 11"  (1.499 m)  Wt 72.122 kg (159 lb)  BMI 32.11 kg/m2 General: NAD Neck: JVP 7-8 cm, no thyromegaly or thyroid nodule.  Lungs: Clear to auscultation bilaterally with normal respiratory effort. CV: Nondisplaced PMI.  Heart irregular S1/S2, no S3/S4, mechanical S2, 1/6 HSM LLSB.  No peripheral edema.  No carotid bruit.  Normal pedal pulses.  Abdomen: Soft, nontender, no hepatosplenomegaly, no distention.  Neurologic: Alert and oriented x 3.  Psych: Normal affect. Extremities: No clubbing or cyanosis.

## 2011-09-24 NOTE — Assessment & Plan Note (Signed)
EF preserved on last echo.  NYHA class II symptoms.  Weight decreasing. Continue current Lasix and KCl doses.  BMET/BNP today

## 2011-09-24 NOTE — Assessment & Plan Note (Signed)
Goal INR 2.5-3.5 with mechanical mitral valve.  She is also on ASA 81 mg daily.  

## 2011-09-24 NOTE — Telephone Encounter (Signed)
Notes Recorded by Jacqlyn Krauss, RN on 09/24/2011 at 4:46 PM Discussed with daughter, Inetta Fermo. Pt will increase KCL to 40 mEq daily (two 20 mEq bid) and recheck BMET in 2 weeks per Dr [name]. Preliminarily reviewed by Triage. Awaiting MD review and signature.  ------  Notes Recorded by Jacqlyn Krauss, RN on 09/24/2011 at 4:41 PM Increase KCL to 40 mEq daily per Dr [name]. Preliminarily reviewed by Triage. Awaiting MD review and signature.  Notes Recorded by Jacqlyn Krauss, RN on 09/24/2011 at 4:46 PM Discussed with daughter, Inetta Fermo. Pt will increase KCL to 40 mEq daily (two 20 mEq bid) and recheck BMET in 2 weeks per Dr [name]. Preliminarily reviewed by Triage. Awaiting MD review and signature.  ------  Notes Recorded by Jacqlyn Krauss, RN on 09/24/2011 at 4:41 PM Increase KCL to 40 mEq daily per Dr [name]. Preliminarily reviewed by Triage. Awaiting MD review and signature.

## 2011-09-24 NOTE — Assessment & Plan Note (Signed)
Continue Toprol XL and coumadin.  Good rate control.  

## 2011-09-24 NOTE — Patient Instructions (Signed)
Your physician recommends that you have  a FASTING lipid profile /liver profile/BMET/BNP today 427.31  424.0   Your physician wants you to follow-up in: 6 months with Dr [name].(July 2013) You will receive a reminder letter in the mail two months in advance. If you don't receive a letter, please call our office to schedule the follow-up appointment.

## 2011-09-26 ENCOUNTER — Other Ambulatory Visit: Payer: Self-pay | Admitting: Cardiology

## 2011-10-02 ENCOUNTER — Ambulatory Visit
Admission: RE | Admit: 2011-10-02 | Discharge: 2011-10-02 | Disposition: A | Discharge status: Home / Self Care | Payer: Medicaid Other | Source: Ambulatory Visit | Attending: Internal Medicine | Admitting: Internal Medicine

## 2011-10-02 ENCOUNTER — Other Ambulatory Visit: Payer: Self-pay | Admitting: Internal Medicine

## 2011-10-02 DIAGNOSIS — R05 Cough: Secondary | ICD-10-CM

## 2011-10-04 ENCOUNTER — Telehealth: Payer: Self-pay | Admitting: Cardiology

## 2011-10-04 NOTE — Telephone Encounter (Signed)
Spoke with pt and she states she just had an occ. Nose bleed when she blew her nose, after blowing her nose the bleeding stopped. Instructed her to use nasal saline to keep membranes moisten. Vaseline to help occluded bled. Pressure and ice pack if needed. If it continue seek medical attention.

## 2011-10-04 NOTE — Telephone Encounter (Signed)
Fu call Patient was returning your call 

## 2011-10-04 NOTE — Telephone Encounter (Signed)
I talked with pt about lab appt scheduled  10/08/11 for BMET/CBC. Pt states yesterday and today when she blew her nose she had some bleeding from her nose. She denies any other signs of bleeding. She is asking if she should come in sooner for a pro-time. I will forward to CVRR to follow-up with pt for further recommendations.

## 2011-10-05 ENCOUNTER — Telehealth: Payer: Self-pay | Admitting: Oncology

## 2011-10-05 NOTE — Telephone Encounter (Signed)
lmonvm adviisng the pt of her sept 2013 appts 

## 2011-10-08 ENCOUNTER — Other Ambulatory Visit (INDEPENDENT_AMBULATORY_CARE_PROVIDER_SITE_OTHER): Payer: Medicaid Other | Admitting: *Deleted

## 2011-10-08 DIAGNOSIS — E876 Hypokalemia: Secondary | ICD-10-CM

## 2011-10-08 DIAGNOSIS — I059 Rheumatic mitral valve disease, unspecified: Secondary | ICD-10-CM

## 2011-10-08 LAB — CBC WITH DIFFERENTIAL/PLATELET
Basophils Absolute: 0 10*3/uL (ref 0.0–0.1)
Basophils Relative: 0.5 % (ref 0.0–3.0)
Eosinophils Relative: 6.1 % — ABNORMAL HIGH (ref 0.0–5.0)
HCT: 35.4 % — ABNORMAL LOW (ref 36.0–46.0)
Hemoglobin: 11.5 g/dL — ABNORMAL LOW (ref 12.0–15.0)
Lymphs Abs: 2.8 10*3/uL (ref 0.7–4.0)
Monocytes Relative: 6.1 % (ref 3.0–12.0)
Neutro Abs: 3.6 10*3/uL (ref 1.4–7.7)
RBC: 4.7 Mil/uL (ref 3.87–5.11)
RDW: 14.6 % (ref 11.5–14.6)

## 2011-10-08 LAB — BASIC METABOLIC PANEL
BUN: 24 mg/dL — ABNORMAL HIGH (ref 6–23)
CO2: 27 mEq/L (ref 19–32)
Calcium: 9.2 mg/dL (ref 8.4–10.5)
Creatinine, Ser: 0.9 mg/dL (ref 0.4–1.2)
Glucose, Bld: 105 mg/dL — ABNORMAL HIGH (ref 70–99)

## 2011-10-22 ENCOUNTER — Ambulatory Visit (INDEPENDENT_AMBULATORY_CARE_PROVIDER_SITE_OTHER): Payer: Medicaid Other | Admitting: *Deleted

## 2011-10-22 DIAGNOSIS — Z7901 Long term (current) use of anticoagulants: Secondary | ICD-10-CM

## 2011-10-22 DIAGNOSIS — Z954 Presence of other heart-valve replacement: Secondary | ICD-10-CM

## 2011-10-22 DIAGNOSIS — I059 Rheumatic mitral valve disease, unspecified: Secondary | ICD-10-CM

## 2011-10-27 ENCOUNTER — Other Ambulatory Visit: Payer: Self-pay | Admitting: Cardiology

## 2011-11-02 NOTE — Telephone Encounter (Signed)
..   Requested Prescriptions   Pending Prescriptions Disp Refills  . metoprolol succinate (TOPROL-XL) 50 MG 24 hr tablet [Pharmacy Med Name: METOPROLOL SUCC ER 50 MG TAB] 60 tablet 0    Sig: TAKE 2 TABLETS BY MOUTH EVERY DAY

## 2011-11-12 ENCOUNTER — Ambulatory Visit (INDEPENDENT_AMBULATORY_CARE_PROVIDER_SITE_OTHER): Payer: Medicaid Other | Admitting: *Deleted

## 2011-11-12 DIAGNOSIS — I059 Rheumatic mitral valve disease, unspecified: Secondary | ICD-10-CM

## 2011-11-12 DIAGNOSIS — Z954 Presence of other heart-valve replacement: Secondary | ICD-10-CM

## 2011-11-12 DIAGNOSIS — Z7901 Long term (current) use of anticoagulants: Secondary | ICD-10-CM

## 2011-11-24 ENCOUNTER — Other Ambulatory Visit: Payer: Self-pay | Admitting: Cardiology

## 2011-11-27 ENCOUNTER — Ambulatory Visit (INDEPENDENT_AMBULATORY_CARE_PROVIDER_SITE_OTHER): Payer: Medicaid Other | Admitting: *Deleted

## 2011-11-27 DIAGNOSIS — I059 Rheumatic mitral valve disease, unspecified: Secondary | ICD-10-CM

## 2011-11-27 DIAGNOSIS — Z954 Presence of other heart-valve replacement: Secondary | ICD-10-CM

## 2011-11-27 DIAGNOSIS — Z7901 Long term (current) use of anticoagulants: Secondary | ICD-10-CM

## 2011-11-28 ENCOUNTER — Encounter (HOSPITAL_COMMUNITY): Payer: Self-pay

## 2011-11-28 ENCOUNTER — Emergency Department (HOSPITAL_COMMUNITY)
Admission: EM | Admit: 2011-11-28 | Discharge: 2011-11-28 | Disposition: A | Discharge status: Home / Self Care | Payer: Medicaid Other | Attending: Emergency Medicine | Admitting: Emergency Medicine

## 2011-11-28 ENCOUNTER — Emergency Department (HOSPITAL_COMMUNITY): Payer: Medicaid Other

## 2011-11-28 ENCOUNTER — Emergency Department (INDEPENDENT_AMBULATORY_CARE_PROVIDER_SITE_OTHER)
Admission: EM | Admit: 2011-11-28 | Discharge: 2011-11-28 | Disposition: A | Discharge status: Nursing Home (Includes ICF) | Payer: Medicaid Other | Source: Home / Self Care | Attending: Emergency Medicine | Admitting: Emergency Medicine

## 2011-11-28 ENCOUNTER — Encounter (HOSPITAL_COMMUNITY): Payer: Self-pay | Admitting: Emergency Medicine

## 2011-11-28 DIAGNOSIS — S060X9A Concussion with loss of consciousness of unspecified duration, initial encounter: Secondary | ICD-10-CM

## 2011-11-28 DIAGNOSIS — IMO0002 Reserved for concepts with insufficient information to code with codable children: Secondary | ICD-10-CM | POA: Insufficient documentation

## 2011-11-28 DIAGNOSIS — S0990XA Unspecified injury of head, initial encounter: Secondary | ICD-10-CM | POA: Insufficient documentation

## 2011-11-28 DIAGNOSIS — R51 Headache: Secondary | ICD-10-CM

## 2011-11-28 DIAGNOSIS — E785 Hyperlipidemia, unspecified: Secondary | ICD-10-CM | POA: Insufficient documentation

## 2011-11-28 DIAGNOSIS — Z79899 Other long term (current) drug therapy: Secondary | ICD-10-CM | POA: Insufficient documentation

## 2011-11-28 DIAGNOSIS — I10 Essential (primary) hypertension: Secondary | ICD-10-CM | POA: Insufficient documentation

## 2011-11-28 LAB — CBC
HCT: 39 % (ref 36.0–46.0)
Hemoglobin: 12.6 g/dL (ref 12.0–15.0)
MCH: 23.5 pg — ABNORMAL LOW (ref 26.0–34.0)
MCHC: 32.3 g/dL (ref 30.0–36.0)
MCV: 72.6 fL — ABNORMAL LOW (ref 78.0–100.0)

## 2011-11-28 LAB — POCT I-STAT, CHEM 8
BUN: 22 mg/dL (ref 6–23)
Calcium, Ion: 1.19 mmol/L (ref 1.12–1.32)
Chloride: 106 mEq/L (ref 96–112)
Creatinine, Ser: 1.1 mg/dL (ref 0.50–1.10)
TCO2: 27 mmol/L (ref 0–100)

## 2011-11-28 LAB — DIFFERENTIAL
Basophils Relative: 1 % (ref 0–1)
Eosinophils Absolute: 0.4 10*3/uL (ref 0.0–0.7)
Lymphs Abs: 2.7 10*3/uL (ref 0.7–4.0)
Monocytes Absolute: 0.5 10*3/uL (ref 0.1–1.0)
Monocytes Relative: 7 % (ref 3–12)

## 2011-11-28 MED ORDER — HYDROCODONE-ACETAMINOPHEN 5-325 MG PO TABS: 1.0000 | ORAL_TABLET | ORAL | Status: AC | PRN

## 2011-11-28 MED ORDER — ONDANSETRON HCL 4 MG/2ML IJ SOLN
4.0000 mg | Freq: Once | INTRAMUSCULAR | Status: AC
  Administered 2011-11-28: 4 mg via INTRAVENOUS
  Filled 2011-11-28: qty 2

## 2011-11-28 MED ORDER — ACETAMINOPHEN 325 MG PO TABS
650.0000 mg | ORAL_TABLET | Freq: Once | ORAL | Status: AC
  Administered 2011-11-28: 650 mg via ORAL
  Filled 2011-11-28: qty 2

## 2011-11-28 MED ORDER — FENTANYL CITRATE 0.05 MG/ML IJ SOLN
50.0000 ug | Freq: Once | INTRAMUSCULAR | Status: DC
  Filled 2011-11-28: qty 2

## 2011-11-28 MED ORDER — MECLIZINE HCL 25 MG PO TABS
12.5000 mg | ORAL_TABLET | Freq: Once | ORAL | Status: AC
  Administered 2011-11-28: 14:00:00 via ORAL
  Filled 2011-11-28 (×2): qty 1

## 2011-11-28 MED ORDER — MECLIZINE HCL 25 MG PO TABS: 25.0000 mg | ORAL_TABLET | Freq: Four times a day (QID) | ORAL | Status: AC
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
## 2011-11-28 NOTE — Discharge Instructions (Signed)
We have determined that your problem requires further evaluation in the emergency department.  We will take care of your transport there.  Once at the emergency department, you will be evaluated by a provider and they will order whatever treatment or tests they deem necessary.  We cannot guarantee that they will do any specific test or do any specific treatment.  ° °

## 2011-11-28 NOTE — Discharge Instructions (Signed)
Concussion and Brain Injury A blow or jolt to the head can disrupt the normal function of the brain. This type of brain injury is often called a "concussion" or a "closed head injury." Concussions are usually not life-threatening. Even so, the effects of a concussion can be serious.  CAUSES  A concussion is caused by a blunt blow to the head. The blow might be direct or indirect as described below.  Direct blow (running into another player during a soccer game, being hit in a fight, or hitting your head on a hard surface).   Indirect blow (when your head moves rapidly and violently back and forth like in a car crash).  SYMPTOMS  The brain is very complex. Every head injury is different. Some symptoms may appear right away. Other symptoms may not show up for days or weeks after the concussion. The signs of concussion can be hard to notice. Early on, problems may be missed by patients, family members, and caregivers. You may look fine even though you are acting or feeling differently.  These symptoms are usually temporary, but may last for days, weeks, or even longer. Symptoms include:  Mild headaches that will not go away.   Having more trouble than usual with:   Remembering things.   Paying attention or concentrating.   Organizing daily tasks.   Making decisions and solving problems.   Slowness in thinking, acting, speaking, or reading.   Getting lost or easily confused.   Feeling tired all the time or lacking energy (fatigue).   Feeling drowsy.   Sleep disturbances.   Sleeping more than usual.   Sleeping less than usual.   Trouble falling asleep.   Trouble sleeping (insomnia).   Loss of balance or feeling lightheaded or dizzy.   Nausea or vomiting.   Numbness or tingling.   Increased sensitivity to:   Sounds.   Lights.   Distractions.  Other symptoms might include:  Vision problems or eyes that tire easily.   Diminished sense of taste or smell.   Ringing  in the ears.   Mood changes such as feeling sad, anxious, or listless.   Becoming easily irritated or angry for little or no reason.   Lack of motivation.  DIAGNOSIS  Your caregiver can usually diagnose a concussion or mild brain injury based on your description of your injury and your symptoms.  Your evaluation might include:  A brain scan to look for signs of injury to the brain. Even if the test shows no injury, you may still have a concussion.   Blood tests to be sure other problems are not present.  TREATMENT   People with a concussion need to be examined and evaluated. Most people with concussions are treated in an emergency department, urgent care, or clinic. Some people must stay in the hospital overnight for further treatment.   Your caregiver will send you home with important instructions to follow. Be sure to carefully follow them.   Tell your caregiver if you are already taking any medicines (prescription, over-the-counter, or natural remedies), or if you are drinking alcohol or taking illegal drugs. Also, talk with your caregiver if you are taking blood thinners (anticoagulants) or aspirin. These drugs may increase your chances of complications. All of this is important information that may affect treatment.   Only take over-the-counter or prescription medicines for pain, discomfort, or fever as directed by your caregiver.  PROGNOSIS  How fast people recover from brain injury varies from person to person.   Although most people have a good recovery, how quickly they improve depends on many factors. These factors include how severe their concussion was, what part of the brain was injured, their age, and how healthy they were before the concussion.  Because all head injuries are different, so is recovery. Most people with mild injuries recover fully. Recovery can take time. In general, recovery is slower in older persons. Also, persons who have had a concussion in the past or have  other medical problems may find that it takes longer to recover from their current injury. Anxiety and depression may also make it harder to adjust to the symptoms of brain injury. HOME CARE INSTRUCTIONS  Return to your normal activities slowly, not all at once. You must give your body and brain enough time for recovery.  Get plenty of sleep at night, and rest during the day. Rest helps the brain to heal.   Avoid staying up late at night.   Keep the same bedtime hours on weekends and weekdays.   Take daytime naps or rest breaks when you feel tired.   Limit activities that require a lot of thought or concentration (brain or cognitive rest). This includes:   Homework or job-related work.   Watching TV.   Computer work.   Avoid activities that could lead to a second brain injury, such as contact or recreational sports, until your caregiver says it is okay. Even after your brain injury has healed, you should protect yourself from having another concussion.   Ask your caregiver when you can return to your normal activities such as driving, bicycling, or operating heavy equipment. Your ability to react may be slower after a brain injury.   Talk with your caregiver about when you can return to work or school.   Inform your teachers, school nurse, school counselor, coach, Product/process development scientist, or work Freight forwarder about your injury, symptoms, and restrictions. They should be instructed to report:   Increased problems with attention or concentration.   Increased problems remembering or learning new information.   Increased time needed to complete tasks or assignments.   Increased irritability or decreased ability to cope with stress.   Increased symptoms.   Take only those medicines that your caregiver has approved.   Do not drink alcohol until your caregiver says you are well enough to do so. Alcohol and certain other drugs may slow your recovery and can put you at risk of further injury.    If it is harder than usual to remember things, write them down.   If you are easily distracted, try to do one thing at a time. For example, do not try to watch TV while fixing dinner.   Talk with family members or close friends when making important decisions.   Keep all follow-up appointments. Repeated evaluation of your symptoms is recommended for your recovery.  PREVENTION  Protect your head from future injury. It is very important to avoid another head or brain injury before you have recovered. In rare cases, another injury has lead to permanent brain damage, brain swelling, or death. Avoid injuries by using:  Seatbelts when riding in a car.   Alcohol only in moderation.   A helmet when biking, skiing, skateboarding, skating, or doing similar activities.   Safety measures in your home.   Remove clutter and tripping hazards from floors and stairways.   Use grab bars in bathrooms and handrails by stairs.   Place non-slip mats on floors and in bathtubs.  Improve lighting in dim areas.  SEEK MEDICAL CARE IF:  A head injury can cause lingering symptoms. You should seek medical care if you have any of the following symptoms for more than 3 weeks after your injury or are planning to return to sports:  Chronic headaches.   Dizziness or balance problems.   Nausea.   Vision problems.   Increased sensitivity to noise or light.   Depression or mood swings.   Anxiety or irritability.   Memory problems.   Difficulty concentrating or paying attention.   Sleep problems.   Feeling tired all the time.  SEEK IMMEDIATE MEDICAL CARE IF:  You have had a blow or jolt to the head and you (or your family or friends) notice:  Severe or worsening headaches.   Weakness (even if only in one hand or one leg or one part of the face), numbness, or decreased coordination.   Repeated vomiting.   Increased sleepiness or passing out.   One black center of the eye (pupil) is larger  than the other.   Convulsions (seizures).   Slurred speech.   Increasing confusion, restlessness, agitation, or irritability.   Lack of ability to recognize people or places.   Neck pain.   Difficulty being awakened.   Unusual behavior changes.   Loss of consciousness.  Older adults with a brain injury may have a higher risk of serious complications such as a blood clot on the brain. Headaches that get worse or an increase in confusion are signs of this complication. If these signs occur, see a caregiver right away. MAKE SURE YOU:   Understand these instructions.   Will watch your condition.   Will get help right away if you are not doing well or get worse.  FOR MORE INFORMATION  Several groups help people with brain injury and their families. They provide information and put people in touch with local resources. These include support groups, rehabilitation services, and a variety of health care professionals. Among these groups, the Brain Injury Association (BIA, www.biausa.org) has a Secretary/administrator that gathers scientific and educational information and works on a national level to help people with brain injury.  Document Released: 11/03/2003 Document Revised: 08/02/2011 Document Reviewed: 03/31/2008 [facility] Patient Information 2012 [place], [place].Blunt Trauma You have been evaluated for injuries. You have been examined and your caregiver has not found injuries serious enough to require hospitalization. It is common to have multiple bruises and sore muscles following an accident. These tend to feel worse for the first 24 hours. You will feel more stiffness and soreness over the next several hours and worse when you wake up the first morning after your accident. After this point, you should begin to improve with each passing day. The amount of improvement depends on the amount of damage done in the accident. Following your accident, if some part of your body does not work as it  should, or if the pain in any area continues to increase, you should return to the Emergency Department for re-evaluation.  HOME CARE INSTRUCTIONS  Routine care for sore areas should include:  Ice to sore areas every 2 hours for 20 minutes while awake for the next 2 days.   Drink extra fluids (not alcohol).   Take a hot or warm shower or bath once or twice a day to increase blood flow to sore muscles. This will help you "limber up".   Activity as tolerated. Lifting may aggravate neck or back pain.   Only take  over-the-counter or prescription medicines for pain, discomfort, or fever as directed by your caregiver. Do not use aspirin. This may increase bruising or increase bleeding if there are small areas where this is happening.  SEEK IMMEDIATE MEDICAL CARE IF:  Numbness, tingling, weakness, or problem with the use of your arms or legs.   A severe headache is not relieved with medications.   There is a change in bowel or bladder control.   Increasing pain in any areas of the body.   Short of breath or dizzy.   Nauseated, vomiting, or sweating.   Increasing belly (abdominal) discomfort.   Blood in urine, stool, or vomiting blood.   Pain in either shoulder in an area where a shoulder strap would be.   Feelings of lightheadedness or if you have a fainting episode.  Sometimes it is not possible to identify all injuries immediately after the trauma. It is important that you continue to monitor your condition after the emergency department visit. If you feel you are not improving, or improving more slowly than should be expected, call your physician. If you feel your symptoms (problems) are worsening, return to the Emergency Department immediately. Document Released: 05/09/2001 Document Revised: 08/02/2011 Document Reviewed: 03/31/2008 [facility] Patient Information 2012 [place], [place].Head Injury, Adult You have had a head injury that does not appear serious at this time. A concussion  is a state of changed mental ability, usually from a blow to the head. You should take clear liquids for the rest of the day and then resume your regular diet. You should not take sedatives or alcoholic beverages for as long as directed by your caregiver after discharge. After injuries such as yours, most problems occur within the first 24 hours. SYMPTOMS These minor symptoms may be experienced after discharge:  Memory difficulties.   Dizziness.   Headaches.   Double vision.   Hearing difficulties.   Depression.   Tiredness.   Weakness.   Difficulty with concentration.  If you experience any of these problems, you should not be alarmed. A concussion requires a few days for recovery. Many patients with head injuries frequently experience such symptoms. Usually, these problems disappear without medical care. If symptoms last for more than one day, notify your caregiver. See your caregiver sooner if symptoms are becoming worse rather than better. HOME CARE INSTRUCTIONS   During the next 24 hours you must stay with someone who can watch you for the warning signs listed below.  Although it is unlikely that serious side effects will occur, you should be aware of signs and symptoms which may necessitate your return to this location. Side effects may occur up to 7 - 10 days following the injury. It is important for you to carefully monitor your condition and contact your caregiver or seek immediate medical attention if there is a change in your condition. SEEK IMMEDIATE MEDICAL CARE IF:   There is confusion or drowsiness.   You can not awaken the injured person.   There is nausea (feeling sick to your stomach) or continued, forceful vomiting.   You notice dizziness or unsteadiness which is getting worse, or inability to walk.   You have convulsions or unconsciousness.   You experience severe, persistent headaches not relieved by over-the-counter or prescription medicines for pain. (Do  not take aspirin as this impairs clotting abilities). Take other pain medications only as directed.   You can not use arms or legs normally.   There is clear or bloody discharge from the nose  or ears.  MAKE SURE YOU:   Understand these instructions.   Will watch your condition.   Will get help right away if you are not doing well or get worse.  Document Released: 08/13/2005 Document Revised: 08/02/2011 Document Reviewed: 07/01/2009 [facility] Patient Information 2012 [place], [place].Headaches, Frequently Asked Questions MIGRAINE HEADACHES Q: What is migraine? What causes it? How can I treat it? A: Generally, migraine headaches begin as a dull ache. Then they develop into a constant, throbbing, and pulsating pain. You may experience pain at the temples. You may experience pain at the front or back of one or both sides of the head. The pain is usually accompanied by a combination of:  Nausea.   Vomiting.   Sensitivity to light and noise.  Some people (about 15%) experience an aura (see below) before an attack. The cause of migraine is believed to be chemical reactions in the brain. Treatment for migraine may include over-the-counter or prescription medications. It may also include self-help techniques. These include relaxation training and biofeedback.  Q: What is an aura? A: About 15% of people with migraine get an "aura". This is a sign of neurological symptoms that occur before a migraine headache. You may see wavy or jagged lines, dots, or flashing lights. You might experience tunnel vision or blind spots in one or both eyes. The aura can include visual or auditory hallucinations (something imagined). It may include disruptions in smell (such as strange odors), taste or touch. Other symptoms include:  Numbness.   A "pins and needles" sensation.   Difficulty in recalling or speaking the correct word.  These neurological events may last as long as 60 minutes. These symptoms will fade as  the headache begins. Q: What is a trigger? A: Certain physical or environmental factors can lead to or "trigger" a migraine. These include:  Foods.   Hormonal changes.   Weather.   Stress.  It is important to remember that triggers are different for everyone. To help prevent migraine attacks, you need to figure out which triggers affect you. Keep a headache diary. This is a good way to track triggers. The diary will help you talk to your healthcare professional about your condition. Q: Does weather affect migraines? A: Bright sunshine, hot, humid conditions, and drastic changes in barometric pressure may lead to, or "trigger," a migraine attack in some people. But studies have shown that weather does not act as a trigger for everyone with migraines. Q: What is the link between migraine and hormones? A: Hormones start and regulate many of your body's functions. Hormones keep your body in balance within a constantly changing environment. The levels of hormones in your body are unbalanced at times. Examples are during menstruation, pregnancy, or menopause. That can lead to a migraine attack. In fact, about three quarters of all women with migraine report that their attacks are related to the menstrual cycle.  Q: Is there an increased risk of stroke for migraine sufferers? A: The likelihood of a migraine attack causing a stroke is very remote. That is not to say that migraine sufferers cannot have a stroke associated with their migraines. In persons under age 31, the most common associated factor for stroke is migraine headache. But over the course of a person's normal life span, the occurrence of migraine headache may actually be associated with a reduced risk of dying from cerebrovascular disease due to stroke.  Q: What are acute medications for migraine? A: Acute medications are used to treat the  pain of the headache after it has started. Examples over-the-counter medications, NSAIDs, ergots, and  triptans.  Q: What are the triptans? A: Triptans are the newest class of abortive medications. They are specifically targeted to treat migraine. Triptans are vasoconstrictors. They moderate some chemical reactions in the brain. The triptans work on receptors in your brain. Triptans help to restore the balance of a neurotransmitter called serotonin. Fluctuations in levels of serotonin are thought to be a main cause of migraine.  Q: Are over-the-counter medications for migraine effective? A: Over-the-counter, or "OTC," medications may be effective in relieving mild to moderate pain and associated symptoms of migraine. But you should see your caregiver before beginning any treatment regimen for migraine.  Q: What are preventive medications for migraine? A: Preventive medications for migraine are sometimes referred to as "prophylactic" treatments. They are used to reduce the frequency, severity, and length of migraine attacks. Examples of preventive medications include antiepileptic medications, antidepressants, beta-blockers, calcium channel blockers, and NSAIDs (nonsteroidal anti-inflammatory drugs). Q: Why are anticonvulsants used to treat migraine? A: During the past few years, there has been an increased interest in antiepileptic drugs for the prevention of migraine. They are sometimes referred to as "anticonvulsants". Both epilepsy and migraine may be caused by similar reactions in the brain.  Q: Why are antidepressants used to treat migraine? A: Antidepressants are typically used to treat people with depression. They may reduce migraine frequency by regulating chemical levels, such as serotonin, in the brain.  Q: What alternative therapies are used to treat migraine? A: The term "alternative therapies" is often used to describe treatments considered outside the scope of conventional Western medicine. Examples of alternative therapy include acupuncture, acupressure, and yoga. Another common alternative  treatment is herbal therapy. Some herbs are believed to relieve headache pain. Always discuss alternative therapies with your caregiver before proceeding. Some herbal products contain arsenic and other toxins. TENSION HEADACHES Q: What is a tension-type headache? What causes it? How can I treat it? A: Tension-type headaches occur randomly. They are often the result of temporary stress, anxiety, fatigue, or anger. Symptoms include soreness in your temples, a tightening band-like sensation around your head (a "vice-like" ache). Symptoms can also include a pulling feeling, pressure sensations, and contracting head and neck muscles. The headache begins in your forehead, temples, or the back of your head and neck. Treatment for tension-type headache may include over-the-counter or prescription medications. Treatment may also include self-help techniques such as relaxation training and biofeedback. CLUSTER HEADACHES Q: What is a cluster headache? What causes it? How can I treat it? A: Cluster headache gets its name because the attacks come in groups. The pain arrives with little, if any, warning. It is usually on one side of the head. A tearing or bloodshot eye and a runny nose on the same side of the headache may also accompany the pain. Cluster headaches are believed to be caused by chemical reactions in the brain. They have been described as the most severe and intense of any headache type. Treatment for cluster headache includes prescription medication and oxygen. SINUS HEADACHES Q: What is a sinus headache? What causes it? How can I treat it? A: When a cavity in the bones of the face and skull (a sinus) becomes inflamed, the inflammation will cause localized pain. This condition is usually the result of an allergic reaction, a tumor, or an infection. If your headache is caused by a sinus blockage, such as an infection, you will probably have a fever.  An x-ray will confirm a sinus blockage. Your caregiver's  treatment might include antibiotics for the infection, as well as antihistamines or decongestants.  REBOUND HEADACHES Q: What is a rebound headache? What causes it? How can I treat it? A: A pattern of taking acute headache medications too often can lead to a condition known as "rebound headache." A pattern of taking too much headache medication includes taking it more than 2 days per week or in excessive amounts. That means more than the label or a caregiver advises. With rebound headaches, your medications not only stop relieving pain, they actually begin to cause headaches. Doctors treat rebound headache by tapering the medication that is being overused. Sometimes your caregiver will gradually substitute a different type of treatment or medication. Stopping may be a challenge. Regularly overusing a medication increases the potential for serious side effects. Consult a caregiver if you regularly use headache medications more than 2 days per week or more than the label advises. ADDITIONAL QUESTIONS AND ANSWERS Q: What is biofeedback? A: Biofeedback is a self-help treatment. Biofeedback uses special equipment to monitor your body's involuntary physical responses. Biofeedback monitors:  Breathing.   Pulse.   Heart rate.   Temperature.   Muscle tension.   Brain activity.  Biofeedback helps you refine and perfect your relaxation exercises. You learn to control the physical responses that are related to stress. Once the technique has been mastered, you do not need the equipment any more. Q: Are headaches hereditary? A: Four out of five (80%) of people that suffer report a family history of migraine. Scientists are not sure if this is genetic or a family predisposition. Despite the uncertainty, a child has a 50% chance of having migraine if one parent suffers. The child has a 75% chance if both parents suffer.  Q: Can children get headaches? A: By the time they reach high school, most young people  have experienced some type of headache. Many safe and effective approaches or medications can prevent a headache from occurring or stop it after it has begun.  Q: What type of doctor should I see to diagnose and treat my headache? A: Start with your primary caregiver. Discuss his or her experience and approach to headaches. Discuss methods of classification, diagnosis, and treatment. Your caregiver may decide to recommend you to a headache specialist, depending upon your symptoms or other physical conditions. Having diabetes, allergies, etc., may require a more comprehensive and inclusive approach to your headache. The National Headache Foundation will provide, upon request, a list of [facility] physician members in your state. Document Released: 11/03/2003 Document Revised: 08/02/2011 Document Reviewed: 04/12/2008 [facility] Patient Information 2012 [place], [place].

## 2011-11-28 NOTE — ED Notes (Signed)
Discharged home with husband.  Steady gait, written and verbal instrcutions

## 2011-11-29 NOTE — ED Provider Notes (Signed)
Medical screening examination/treatment/procedure(s) were performed by non-physician practitioner and as supervising physician I was immediately available for consultation/collaboration.  Ethelda Chick, MD 11/29/11 2308

## 2011-12-07 ENCOUNTER — Ambulatory Visit (INDEPENDENT_AMBULATORY_CARE_PROVIDER_SITE_OTHER): Payer: Medicaid Other | Admitting: Pharmacist

## 2011-12-07 DIAGNOSIS — Z954 Presence of other heart-valve replacement: Secondary | ICD-10-CM

## 2011-12-07 DIAGNOSIS — I059 Rheumatic mitral valve disease, unspecified: Secondary | ICD-10-CM

## 2011-12-07 DIAGNOSIS — Z7901 Long term (current) use of anticoagulants: Secondary | ICD-10-CM

## 2011-12-21 ENCOUNTER — Ambulatory Visit (INDEPENDENT_AMBULATORY_CARE_PROVIDER_SITE_OTHER): Payer: Medicaid Other | Admitting: Pharmacist

## 2011-12-21 DIAGNOSIS — I059 Rheumatic mitral valve disease, unspecified: Secondary | ICD-10-CM

## 2011-12-21 DIAGNOSIS — Z954 Presence of other heart-valve replacement: Secondary | ICD-10-CM

## 2011-12-21 DIAGNOSIS — Z7901 Long term (current) use of anticoagulants: Secondary | ICD-10-CM

## 2011-12-21 LAB — POCT INR: INR: 3.2

## 2011-12-25 ENCOUNTER — Other Ambulatory Visit: Payer: Self-pay | Admitting: Cardiology

## 2011-12-25 ENCOUNTER — Other Ambulatory Visit: Payer: Self-pay

## 2011-12-25 MED ORDER — METOPROLOL SUCCINATE ER 50 MG PO TB24: ORAL_TABLET | ORAL | Status: DC

## 2011-12-31 ENCOUNTER — Telehealth: Payer: Self-pay | Admitting: Cardiology

## 2011-12-31 NOTE — Telephone Encounter (Signed)
Spoke with pt. She is aware of Dr Alford Highland recommendations.

## 2011-12-31 NOTE — Telephone Encounter (Signed)
OK to fly.  Make sure she is taking her coumadin.  Make sure she takes her medications with her.

## 2011-12-31 NOTE — Telephone Encounter (Signed)
Spoke with pt. Pt asking if OK with Dr [name] for her to fly to [place] with her daughter  the end of May. She will be gone a week or so.  This is will be the first time she has flown since surgery and being placed on coumadin.  Pt denies any change in SOB or palpitations. I advised pt she should contact her PCP for recommendation for something for her nerves. I will forward to Dr [name] for review

## 2011-12-31 NOTE — Telephone Encounter (Signed)
Pt wants to know if she can fly to [place] and can she have something for her nerves and does she need to be seen sooner

## 2012-01-17 ENCOUNTER — Ambulatory Visit (INDEPENDENT_AMBULATORY_CARE_PROVIDER_SITE_OTHER): Payer: Medicaid Other | Admitting: *Deleted

## 2012-01-17 DIAGNOSIS — Z954 Presence of other heart-valve replacement: Secondary | ICD-10-CM

## 2012-01-17 DIAGNOSIS — Z7901 Long term (current) use of anticoagulants: Secondary | ICD-10-CM

## 2012-01-17 DIAGNOSIS — I059 Rheumatic mitral valve disease, unspecified: Secondary | ICD-10-CM

## 2012-01-17 LAB — POCT INR: INR: 2.6

## 2012-01-29 ENCOUNTER — Other Ambulatory Visit: Payer: Self-pay | Admitting: Cardiology

## 2012-02-14 ENCOUNTER — Ambulatory Visit (INDEPENDENT_AMBULATORY_CARE_PROVIDER_SITE_OTHER): Payer: Medicaid Other | Admitting: *Deleted

## 2012-02-14 DIAGNOSIS — I059 Rheumatic mitral valve disease, unspecified: Secondary | ICD-10-CM

## 2012-02-14 DIAGNOSIS — Z954 Presence of other heart-valve replacement: Secondary | ICD-10-CM

## 2012-02-14 DIAGNOSIS — Z7901 Long term (current) use of anticoagulants: Secondary | ICD-10-CM

## 2012-02-14 LAB — POCT INR: INR: 3.5

## 2012-03-13 ENCOUNTER — Ambulatory Visit (INDEPENDENT_AMBULATORY_CARE_PROVIDER_SITE_OTHER): Payer: Medicaid Other | Admitting: *Deleted

## 2012-03-13 DIAGNOSIS — I059 Rheumatic mitral valve disease, unspecified: Secondary | ICD-10-CM

## 2012-03-13 DIAGNOSIS — Z7901 Long term (current) use of anticoagulants: Secondary | ICD-10-CM

## 2012-03-13 DIAGNOSIS — Z954 Presence of other heart-valve replacement: Secondary | ICD-10-CM

## 2012-03-24 ENCOUNTER — Ambulatory Visit (INDEPENDENT_AMBULATORY_CARE_PROVIDER_SITE_OTHER): Payer: Medicaid Other | Admitting: Cardiology

## 2012-03-24 ENCOUNTER — Encounter: Payer: Self-pay | Admitting: Cardiology

## 2012-03-24 VITALS — BP 136/82 | HR 65 | Ht 59.0 in | Wt 160.0 lb

## 2012-03-24 DIAGNOSIS — I482 Chronic atrial fibrillation, unspecified: Secondary | ICD-10-CM

## 2012-03-24 DIAGNOSIS — I5032 Chronic diastolic (congestive) heart failure: Secondary | ICD-10-CM

## 2012-03-24 DIAGNOSIS — R0602 Shortness of breath: Secondary | ICD-10-CM

## 2012-03-24 DIAGNOSIS — I4891 Unspecified atrial fibrillation: Secondary | ICD-10-CM

## 2012-03-24 DIAGNOSIS — I059 Rheumatic mitral valve disease, unspecified: Secondary | ICD-10-CM

## 2012-03-24 DIAGNOSIS — I509 Heart failure, unspecified: Secondary | ICD-10-CM

## 2012-03-24 DIAGNOSIS — E785 Hyperlipidemia, unspecified: Secondary | ICD-10-CM

## 2012-03-24 LAB — BASIC METABOLIC PANEL
BUN: 18 mg/dL (ref 6–23)
Calcium: 9.4 mg/dL (ref 8.4–10.5)
Creatinine, Ser: 0.9 mg/dL (ref 0.4–1.2)
GFR: 66.3 mL/min (ref >60.00)
Potassium: 3.4 mEq/L — ABNORMAL LOW (ref 3.5–5.1)

## 2012-03-24 LAB — CBC WITH DIFFERENTIAL/PLATELET
Basophils Relative: 1.1 % (ref 0.0–3.0)
Eosinophils Relative: 9.3 % — ABNORMAL HIGH (ref 0.0–5.0)
HCT: 38.1 % (ref 36.0–46.0)
Hemoglobin: 11.7 g/dL — ABNORMAL LOW (ref 12.0–15.0)
Lymphs Abs: 2.3 10*3/uL (ref 0.7–4.0)
MCV: 76.1 fl — ABNORMAL LOW (ref 78.0–100.0)
Monocytes Absolute: 0.5 10*3/uL (ref 0.1–1.0)
RBC: 5.01 Mil/uL (ref 3.87–5.11)
WBC: 5.9 10*3/uL (ref 4.5–10.5)

## 2012-03-24 LAB — BRAIN NATRIURETIC PEPTIDE: Pro B Natriuretic peptide (BNP): 90 pg/mL (ref 0.0–100.0)

## 2012-03-24 NOTE — Assessment & Plan Note (Signed)
EF preserved on last echo.  NYHA class II symptoms.  Weight stable. Continue current Lasix and KCl doses.  BMET/BNP today

## 2012-03-24 NOTE — Progress Notes (Signed)
Patient ID: Deborah [name], female   DOB: 21-Feb-1951, 61 y.o.   MRN: [id number] PCP: Dala Dock, Dr. Philipp Deputy  61 yo with rheumatic heart disease and MV replacement/TV repair, chronic atrial fibrillation, and chronic diastolic CHF presents for cardiology followup.  Weight stable.  No chest pain.  She walks for 40 minutes three times a week. After walking 10 minutes, she will be short of breath and have to stop.  She then can walk another 30 minutes with no problems.  This has been a chronic stable pattern.  She can climb a flight of steps without problems.  No orthopnea or PND.  She reports some left-sided upper back pain several days ago that lasted 20 minutes then resolved.    ECG:  Atrial fibrillation at 65, right axis deviation  Labs (8/12): K 3.5, creatinine 0.98 Labs (10/12): K 3.8, creatinine 1.0, BNP 116 Labs (1/13): K 3.6, creatinine 0.9, LDL 85, HDL 62  PMH: 1. Rheumatic heart disease: Status post mechanical MV replacement and tricuspid repair at [facility] in 2002.  Last echo 5/12 with EF 60-65%, mild AI, mechanical MV prosthesis with normal function, severe LAE, moderate RAE, moderate to severe TR, mildly elevated PA systolic pressure.  2. Chronic atrial fibrillation on coumadin.  Event montor (7/12-8/12) showed no high rate episodes (patient continuously in atrial fibrillation).  3. Chronic diastolic CHF 4. ETT-myoview (7/12): No evidence for ischemia or infarction.  5. Impaired fasting glucose 6. Hyperlipidemia  7. HTN 8. Gout 9. Anemia 10. External hemorrhoids.   SH: Nonsmoker.   FH: No premature CAD  Current Outpatient Prescriptions  Medication Sig Dispense Refill  . acetaminophen (TYLENOL) 325 MG tablet Take 650 mg by mouth every 6 (six) hours as needed.       Marland Kitchen allopurinol (ZYLOPRIM) 300 MG tablet Take 300 mg by mouth as needed.       Marland Kitchen aspirin EC 81 MG tablet Take 81 mg by mouth daily.      Marland Kitchen COUMADIN 2 MG tablet TAKE 1 TABLET BY MOUTH EVERY DAY OR AS DIRECTED  35 tablet   3  . folic acid (FOLVITE) 1 MG tablet Take 1 mg by mouth daily.       . furosemide (LASIX) 40 MG tablet Take 60 mg by mouth 2 (two) times daily.      . metoprolol succinate (TOPROL-XL) 50 MG 24 hr tablet Take 1 tablet by mouth daily  30 tablet  3  . omeprazole (PRILOSEC) 20 MG capsule Take 20 mg by mouth daily.       . potassium chloride SA (K-DUR,KLOR-CON) 20 MEQ tablet Take 1 tablet (20 mEq total) by mouth 2 (two) times daily.  60 tablet  6  . rosuvastatin (CRESTOR) 5 MG tablet Take 5 mg by mouth every other day. Last dose taken on Monday      . warfarin (COUMADIN) 2 MG tablet Take 1-2 mg by mouth daily. Takes 1 mg on Mondays and 2 mg on all other days        BP 136/82  Pulse 65  Ht 4\' 11"  (1.499 m)  Wt 160 lb (72.576 kg)  BMI 32.32 kg/m2 General: NAD Neck: JVP 7 cm, no thyromegaly or thyroid nodule.  Lungs: Clear to auscultation bilaterally with normal respiratory effort. CV: Nondisplaced PMI.  Heart irregular S1/S2, no S3/S4, mechanical S2, 1/6 HSM LLSB.  No peripheral edema.  No carotid bruit.  Normal pedal pulses.  Abdomen: Soft, nontender, no hepatosplenomegaly, no distention.  Neurologic: Alert and oriented x  3.  Psych: Normal affect. Extremities: No clubbing or cyanosis.

## 2012-03-24 NOTE — Assessment & Plan Note (Signed)
Goal INR 2.5-3.5 with mechanical mitral valve.  She is also on ASA 81 mg daily.  

## 2012-03-24 NOTE — Patient Instructions (Addendum)
Your physician recommends that you return for lab work in: today (BNP, BMET, LIPID)  Your physician wants you to follow-up in: 6 months.   You will receive a reminder letter in the mail two months in advance. If you don't receive a letter, please call our office to schedule the follow-up appointment.

## 2012-03-24 NOTE — Assessment & Plan Note (Signed)
Continue Toprol XL and coumadin.  Good rate control.

## 2012-03-25 ENCOUNTER — Other Ambulatory Visit: Payer: Self-pay | Admitting: *Deleted

## 2012-03-25 MED ORDER — POTASSIUM CHLORIDE CRYS ER 20 MEQ PO TBCR: EXTENDED_RELEASE_TABLET | ORAL | Status: DC

## 2012-04-09 ENCOUNTER — Ambulatory Visit (INDEPENDENT_AMBULATORY_CARE_PROVIDER_SITE_OTHER): Payer: Medicaid Other | Admitting: *Deleted

## 2012-04-09 DIAGNOSIS — I059 Rheumatic mitral valve disease, unspecified: Secondary | ICD-10-CM

## 2012-04-09 DIAGNOSIS — Z954 Presence of other heart-valve replacement: Secondary | ICD-10-CM

## 2012-04-09 DIAGNOSIS — Z7901 Long term (current) use of anticoagulants: Secondary | ICD-10-CM

## 2012-05-01 ENCOUNTER — Ambulatory Visit (HOSPITAL_BASED_OUTPATIENT_CLINIC_OR_DEPARTMENT_OTHER): Payer: Medicaid Other | Admitting: Oncology

## 2012-05-01 ENCOUNTER — Encounter: Payer: Self-pay | Admitting: Oncology

## 2012-05-01 ENCOUNTER — Other Ambulatory Visit (HOSPITAL_BASED_OUTPATIENT_CLINIC_OR_DEPARTMENT_OTHER): Payer: Medicaid Other | Admitting: Lab

## 2012-05-01 VITALS — BP 133/78 | HR 80 | Temp 97.0°F | Resp 20 | Ht 59.0 in | Wt 154.8 lb

## 2012-05-01 DIAGNOSIS — D582 Other hemoglobinopathies: Secondary | ICD-10-CM

## 2012-05-01 DIAGNOSIS — D509 Iron deficiency anemia, unspecified: Secondary | ICD-10-CM | POA: Insufficient documentation

## 2012-05-01 DIAGNOSIS — M25559 Pain in unspecified hip: Secondary | ICD-10-CM

## 2012-05-01 DIAGNOSIS — D560 Alpha thalassemia: Secondary | ICD-10-CM

## 2012-05-01 HISTORY — DX: Alpha thalassemia: D56.0

## 2012-05-01 HISTORY — DX: Other hemoglobinopathies: D58.2

## 2012-05-01 LAB — CBC WITH DIFFERENTIAL/PLATELET
Basophils Absolute: 0.1 10*3/uL (ref 0.0–0.1)
Eosinophils Absolute: 0.5 10*3/uL (ref 0.0–0.5)
HCT: 37.8 % (ref 34.8–46.6)
HGB: 11.9 g/dL (ref 11.6–15.9)
LYMPH%: 41.4 % (ref 14.0–49.7)
MCHC: 31.6 g/dL (ref 31.5–36.0)
MONO#: 0.6 10*3/uL (ref 0.1–0.9)
NEUT#: 2.6 10*3/uL (ref 1.5–6.5)
NEUT%: 40.2 % (ref 38.4–76.8)
Platelets: 148 10*3/uL (ref 145–400)
WBC: 6.5 10*3/uL (ref 3.9–10.3)

## 2012-05-01 LAB — COMPREHENSIVE METABOLIC PANEL (CC13)
BUN: 16 mg/dL (ref 7.0–26.0)
CO2: 26 mEq/L (ref 22–29)
Calcium: 9.6 mg/dL (ref 8.4–10.4)
Chloride: 105 mEq/L (ref 98–107)
Creatinine: 1 mg/dL (ref 0.6–1.1)
Glucose: 100 mg/dl — ABNORMAL HIGH (ref 70–99)
Total Bilirubin: 2.3 mg/dL — ABNORMAL HIGH (ref 0.20–1.20)

## 2012-05-01 NOTE — Patient Instructions (Addendum)
1. 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 2. Treatment: Observation unless the severe anemia. 3. Followup: In about one year.

## 2012-05-01 NOTE — Progress Notes (Signed)
[facility]  [phone] [phone]   OFFICE PROGRESS NOTE   Cc:  MULBERRY,ELIZABETH, MD  DIAGNOSIS:  Hgb E and Hgb Constant Spring without anemia.   CURRENT THERAPY: watchful observation.   INTERVAL HISTORY: Deborah [name] 61 y.o. female returns for regular follow up with her daughter.  She reports feeling relatively well.  She has baseline DOE that has been on going for years without worsening in intensity.  She denied fever, jaundice, abdominal pain, visible source of bleeding.  She has had moderate left hip pain that is worst in the morning and improved as the day improved.  She has only needed to take OTC pain med about once a day.  This pain does not radiate to the left leg.  There is no back pain, paresthesia, bowel or bladder incontinence.   Patient denies anorexia, weight loss, fatigue, headache, visual changes, confusion, drenching night sweats, palpable lymph node swelling, mucositis, odynophagia, dysphagia, nausea vomiting, jaundice, chest pain, palpitation, productive cough, gum bleeding, epistaxis, hematemesis, hemoptysis, abdominal pain, abdominal swelling, early satiety, melena, hematochezia, hematuria, skin rash, spontaneous bleeding, joint swelling, heat or cold intolerance.    Past Medical History  Diagnosis Date  . Rheumatic heart disease     Status post mechanical mitral valve replacement and tricuspid valve repair at [facility] in 2002; echo 5/12:   EF 60-65%, mild AI, mitral valve prosthesis with AVA 1.66, severe LAE, moderate RAE, moderate to severe TR, mild increased pulmonary artery systolic pressure;    Adenosine Cardiolite in 4/09:   No ischemia, EF 66%  . Chronic atrial fibrillation   . Chronic anticoagulation   . Fatigue   . Borderline diabetes   . History of mitral valve replacement   . Hyperlipidemia   . Obesity   . Anemia   . Palpitations   . Headache   . Gout   . Hypertension   . Carpal tunnel syndrome   .  External hemorrhoids   . Rotator cuff syndrome   . 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 Tricuspid valve repair   . Cardiac catheterization     EF 55%    Current Outpatient Prescriptions  Medication Sig Dispense Refill  . acetaminophen (TYLENOL) 325 MG tablet Take 650 mg by mouth every 6 (six) hours as needed.       Marland Kitchen allopurinol (ZYLOPRIM) 300 MG tablet Take 300 mg by mouth as needed.       Marland Kitchen aspirin EC 81 MG tablet Take 81 mg by mouth daily.      Marland Kitchen COUMADIN 2 MG tablet TAKE 1 TABLET BY MOUTH EVERY DAY OR AS DIRECTED  35 tablet  3  . folic acid (FOLVITE) 1 MG tablet Take 1 mg by mouth daily.       . furosemide (LASIX) 40 MG tablet Take 60 mg by mouth 2 (two) times daily.      . metoprolol succinate (TOPROL-XL) 50 MG 24 hr tablet Take 1 tablet by mouth daily  30 tablet  3  . omeprazole (PRILOSEC) 20 MG capsule Take 20 mg by mouth daily.       . potassium chloride SA (K-DUR,KLOR-CON) 20 MEQ tablet Take 2 in the morning and 1 in the evening  90 tablet  6  . rosuvastatin (CRESTOR) 5 MG tablet Take 5 mg by mouth every other day. Last dose taken on Monday  ALLERGIES:  is allergic to promethazine hcl and promethazine hcl.  REVIEW OF SYSTEMS:  The rest of the 14-point review of system was negative.   Filed Vitals:   05/01/12 1201  BP: 133/78  Pulse: 80  Temp: 97 F (36.1 C)  Resp: 20   Wt Readings from Last 3 Encounters:  05/01/12 154 lb 12.8 oz (70.217 kg)  03/24/12 160 lb (72.576 kg)  09/24/11 159 lb (72.122 kg)   ECOG Performance status: 0-1  PHYSICAL EXAMINATION:   General:  well-nourished woman, in no acute distress.  Eyes:  no scleral icterus.  ENT:  There were no oropharyngeal lesions.  Neck was without thyromegaly.  Lymphatics:  Negative cervical, supraclavicular or axillary adenopathy.  Respiratory: lungs were clear bilaterally without wheezing or crackles.  Cardiovascular:   Regular rate and rhythm, S1/S2, without murmur, rub or gallop.  There was no pedal edema.  GI:  abdomen was soft, flat, nontender, nondistended, without organomegaly.  Muscoloskeletal:  no spinal tenderness of palpation of vertebral spine. There was no left hip pain on palpation.  Skin exam was without echymosis, petichae.  Neuro exam was nonfocal.  Patient was able to get on and off exam table without assistance.  Gait was normal.  Patient was alerted and oriented.  Attention was good.   Language was appropriate.  Mood was normal without depression.  Speech was not pressured.  Thought content was not tangential.     LABORATORY/RADIOLOGY DATA:  Lab Results  Component Value Date   WBC 6.5 05/01/2012   HGB 11.9 05/01/2012   HCT 37.8 05/01/2012   PLT 148 05/01/2012   GLUCOSE 100* 05/01/2012   CHOL 196 03/24/2012   TRIG 124.0 03/24/2012   HDL 59.60 03/24/2012   LDLCALC 112* 03/24/2012   ALKPHOS 71 05/01/2012   ALT 25 05/01/2012   AST 26 05/01/2012   NA 143 05/01/2012   K 3.9 05/01/2012   CL 105 05/01/2012   CREATININE 1.0 05/01/2012   BUN 16.0 05/01/2012   CO2 26 05/01/2012   INR 3.4 04/09/2012   HGBA1C 5.7 04/04/2010    ASSESSMENT AND PLAN:   1.  Hemoglobin E and Constant Spring:   - stable.  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  - No treatment needed.   2.  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  No evidence of biliary disease; gall stone.   3.  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  I advised her to discuss with her PCP to see if more pain medication or steroid arthroscopic injection is indicated.   This is most likely not related to her hemoglobinopathy   4.  Hypertension:  She is on Lasix and Metoprolol.  I advised her on more routine exercise which she does about daily with leasure pace walking and significant cut back on fish sauce to limit her salt intake.  Goal is secondary prevention for recurrent CAD and CVA.    5.  Age-appropriate cancer screening:   She is up to date with mammogram and Papsmear.  However, she still has not had conlonscopy.  I advised her to strongly consider this.  She said that she would like to think this over and will ask her PCP for GI referral when she is ready emotionally.   6.  Follow up:  In about 1 year but sooner if concerning issues.     The length of time of the face-to-face encounter was 15  minutes. More than  50% of time was spent counseling and coordination of care.

## 2012-05-07 ENCOUNTER — Ambulatory Visit (INDEPENDENT_AMBULATORY_CARE_PROVIDER_SITE_OTHER): Payer: Medicaid Other | Admitting: *Deleted

## 2012-05-07 DIAGNOSIS — Z954 Presence of other heart-valve replacement: Secondary | ICD-10-CM

## 2012-05-07 DIAGNOSIS — Z7901 Long term (current) use of anticoagulants: Secondary | ICD-10-CM

## 2012-05-07 DIAGNOSIS — I059 Rheumatic mitral valve disease, unspecified: Secondary | ICD-10-CM

## 2012-05-07 LAB — POCT INR: INR: 4.7

## 2012-05-21 ENCOUNTER — Ambulatory Visit (INDEPENDENT_AMBULATORY_CARE_PROVIDER_SITE_OTHER): Payer: Medicaid Other | Admitting: *Deleted

## 2012-05-21 DIAGNOSIS — Z954 Presence of other heart-valve replacement: Secondary | ICD-10-CM

## 2012-05-21 DIAGNOSIS — Z7901 Long term (current) use of anticoagulants: Secondary | ICD-10-CM

## 2012-05-21 DIAGNOSIS — I059 Rheumatic mitral valve disease, unspecified: Secondary | ICD-10-CM

## 2012-05-21 LAB — POCT INR: INR: 3.2

## 2012-05-27 ENCOUNTER — Other Ambulatory Visit: Payer: Self-pay | Admitting: Cardiology

## 2012-06-11 ENCOUNTER — Ambulatory Visit (INDEPENDENT_AMBULATORY_CARE_PROVIDER_SITE_OTHER): Payer: Medicaid Other | Admitting: Pharmacist

## 2012-06-11 DIAGNOSIS — Z7901 Long term (current) use of anticoagulants: Secondary | ICD-10-CM

## 2012-06-11 DIAGNOSIS — I059 Rheumatic mitral valve disease, unspecified: Secondary | ICD-10-CM

## 2012-06-11 DIAGNOSIS — Z954 Presence of other heart-valve replacement: Secondary | ICD-10-CM

## 2012-06-23 ENCOUNTER — Other Ambulatory Visit: Payer: Self-pay | Admitting: Cardiology

## 2012-07-02 ENCOUNTER — Ambulatory Visit (INDEPENDENT_AMBULATORY_CARE_PROVIDER_SITE_OTHER): Payer: Medicaid Other | Admitting: *Deleted

## 2012-07-02 DIAGNOSIS — Z7901 Long term (current) use of anticoagulants: Secondary | ICD-10-CM

## 2012-07-02 DIAGNOSIS — Z954 Presence of other heart-valve replacement: Secondary | ICD-10-CM

## 2012-07-02 DIAGNOSIS — I059 Rheumatic mitral valve disease, unspecified: Secondary | ICD-10-CM

## 2012-07-23 ENCOUNTER — Ambulatory Visit (INDEPENDENT_AMBULATORY_CARE_PROVIDER_SITE_OTHER): Payer: Medicaid Other | Admitting: *Deleted

## 2012-07-23 DIAGNOSIS — Z954 Presence of other heart-valve replacement: Secondary | ICD-10-CM

## 2012-07-23 DIAGNOSIS — Z7901 Long term (current) use of anticoagulants: Secondary | ICD-10-CM

## 2012-07-23 DIAGNOSIS — I059 Rheumatic mitral valve disease, unspecified: Secondary | ICD-10-CM

## 2012-07-23 LAB — POCT INR: INR: 2.6

## 2012-08-21 ENCOUNTER — Ambulatory Visit (INDEPENDENT_AMBULATORY_CARE_PROVIDER_SITE_OTHER): Payer: Medicaid Other

## 2012-08-21 DIAGNOSIS — I059 Rheumatic mitral valve disease, unspecified: Secondary | ICD-10-CM

## 2012-08-21 DIAGNOSIS — Z7901 Long term (current) use of anticoagulants: Secondary | ICD-10-CM

## 2012-08-21 DIAGNOSIS — Z954 Presence of other heart-valve replacement: Secondary | ICD-10-CM

## 2012-08-21 LAB — POCT INR: INR: 3.2

## 2012-09-03 ENCOUNTER — Emergency Department (HOSPITAL_COMMUNITY)
Admission: EM | Admit: 2012-09-03 | Discharge: 2012-09-03 | Disposition: A | Discharge status: Home / Self Care | Payer: Medicaid Other | Source: Home / Self Care | Attending: Family Medicine | Admitting: Family Medicine

## 2012-09-03 ENCOUNTER — Encounter (HOSPITAL_COMMUNITY): Payer: Self-pay | Admitting: *Deleted

## 2012-09-03 ENCOUNTER — Telehealth: Payer: Self-pay | Admitting: Cardiology

## 2012-09-03 DIAGNOSIS — N390 Urinary tract infection, site not specified: Secondary | ICD-10-CM

## 2012-09-03 DIAGNOSIS — R319 Hematuria, unspecified: Secondary | ICD-10-CM

## 2012-09-03 LAB — URINALYSIS, ROUTINE W REFLEX MICROSCOPIC
Bilirubin Urine: NEGATIVE
Nitrite: POSITIVE — AB
Specific Gravity, Urine: 1.02 (ref 1.005–1.030)
Urobilinogen, UA: 0.2 mg/dL (ref 0.0–1.0)
pH: 5.5 (ref 5.0–8.0)

## 2012-09-03 LAB — URINE MICROSCOPIC-ADD ON

## 2012-09-03 LAB — PROTIME-INR: INR: 2.63 — ABNORMAL HIGH (ref 0.00–1.49)

## 2012-09-03 LAB — POCT URINALYSIS DIP (DEVICE)
Glucose, UA: NEGATIVE mg/dL
Nitrite: NEGATIVE
Protein, ur: NEGATIVE mg/dL
Urobilinogen, UA: 0.2 mg/dL (ref 0.0–1.0)

## 2012-09-03 MED ORDER — CEPHALEXIN 500 MG PO CAPS: 500.0000 mg | ORAL_CAPSULE | Freq: Two times a day (BID) | ORAL | Status: DC

## 2012-09-03 MED ORDER — BENZONATATE 100 MG PO CAPS: 100.0000 mg | ORAL_CAPSULE | Freq: Three times a day (TID) | ORAL | Status: DC

## 2012-09-03 MED ORDER — PHENAZOPYRIDINE HCL 100 MG PO TABS: 200.0000 mg | ORAL_TABLET | Freq: Three times a day (TID) | ORAL | Status: DC | PRN

## 2012-09-03 NOTE — Telephone Encounter (Signed)
New problem:   C/O blood in urine . Patient on  coumadin.

## 2012-09-03 NOTE — ED Notes (Signed)
Pt       Takes   Coumadin   She  Reports  Today  She  Noticed   Some  Blood  In  Her  Urine   As    Sensation of frequency  And  Pressure  In  Her  Bladder  Area   She  Reports  Recently  Got over  An uri    But  That  Is  Better  Now

## 2012-09-03 NOTE — ED Provider Notes (Addendum)
History     CSN: [id number]  Arrival date & time 09/03/12  1857   First MD Initiated Contact with Patient 09/03/12 1907      Chief Complaint  Patient presents with  . Hematuria    (Consider location/radiation/quality/duration/timing/severity/associated sxs/prior treatment) HPI Comments: 62 year old female with history of hemoglobinopathy and mitral valve replacement on chronic Coumadin among other comorbidities. Here complaining of seeing blood in her urine since this morning. Symptoms associated with urinary frequency and suprapubic discomfort. Denies fever or chills. Patient reports she had cold like symptoms 2 weeks ago and completed 10 days of amoxicillin prescribed in another facility over a week ago. She still has some mild cough non productive. Denies chest pain, shortness of breath or wheezing. Patient also states that she had epistaxis through right nostril one week ago. Denies skin bruising, rectal bleeding or other type of mucosal bleeding other than blood in her urine. Denies dizziness, headache or fatigue.   Past Medical History  Diagnosis Date  . Rheumatic heart disease     Status post mechanical mitral valve replacement and tricuspid valve repair at [facility] in 2002; echo 5/12:   EF 60-65%, mild AI, mitral valve prosthesis with AVA 1.66, severe LAE, moderate RAE, moderate to severe TR, mild increased pulmonary artery systolic pressure;    Adenosine Cardiolite in 4/09:   No ischemia, EF 66%  . Chronic atrial fibrillation   . Chronic anticoagulation   . Fatigue   . Borderline diabetes   . History of mitral valve replacement   . Hyperlipidemia   . Obesity   . Anemia   . Palpitations   . Headache   . Gout   . Hypertension   . Carpal tunnel syndrome   . External hemorrhoids   . Rotator cuff syndrome   . 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 Tricuspid valve repair   . Cardiac  catheterization     EF 55%    Family History  Problem Relation Age of Onset  . Stroke Mother   . Diabetes Mother   . Heart failure Mother   . Heart disease Father     "heart problems"  . Heart disease Sister     cardiac problems  . Heart disease Brother   . Gout Brother   . Diabetes Brother   . Stroke Brother     History  Substance Use Topics  . Smoking status: Never Smoker   . Smokeless tobacco: Never Used  . Alcohol Use: No    OB History    Grav Para Term Preterm Abortions TAB SAB Ect Mult Living                  Review of Systems  Constitutional: Negative for fever and chills.  HENT: Negative for sore throat.   Respiratory: Positive for cough. Negative for shortness of breath and wheezing.   Gastrointestinal: Negative for nausea, vomiting, abdominal pain, blood in stool and anal bleeding.  Genitourinary: Positive for dysuria, frequency and hematuria. Negative for vaginal bleeding.  Musculoskeletal: Negative for back pain.  Skin: Negative for rash.       No bruising  Neurological: Negative for dizziness and headaches.    Allergies  Promethazine hcl and Promethazine hcl  Home Medications   Current Outpatient Rx  Name  Route  Sig  Dispense  Refill  . ACETAMINOPHEN 325 MG PO TABS   Oral  Take 650 mg by mouth every 6 (six) hours as needed.          . ALLOPURINOL 300 MG PO TABS   Oral   Take 300 mg by mouth as needed.          . ASPIRIN EC 81 MG PO TBEC   Oral   Take 81 mg by mouth daily.         . CEPHALEXIN 500 MG PO CAPS   Oral   Take 1 capsule (500 mg total) by mouth 2 (two) times daily.   14 capsule   0   . COUMADIN 2 MG PO TABS      TAKE 1 TABLET BY MOUTH EVERY DAY OR AS DIRECTED   35 tablet   3     Dispense as written.    1   . COUMADIN 2 MG PO TABS      TAKE 1 TABLET BY MOUTH EVERY DAY OR AS DIRECTED   35 tablet   3     Dispense as written.   Marland Kitchen FOLIC ACID 1 MG PO TABS   Oral   Take 1 mg by mouth daily.          .  FUROSEMIDE 40 MG PO TABS   Oral   Take 60 mg by mouth 2 (two) times daily.         . FUROSEMIDE 40 MG PO TABS      TAKE 1.5 TABLETS BY MOUTH 2 TIMES DAILY.   90 tablet   6   . METOPROLOL SUCCINATE ER 50 MG PO TB24      Take 1 tablet by mouth daily   30 tablet   3   . OMEPRAZOLE 20 MG PO CPDR   Oral   Take 20 mg by mouth daily.          Marland Kitchen PHENAZOPYRIDINE HCL 100 MG PO TABS   Oral   Take 2 tablets (200 mg total) by mouth 3 (three) times daily as needed for pain.   6 tablet   0   . POTASSIUM CHLORIDE CRYS ER 20 MEQ PO TBCR      Take 2 in the morning and 1 in the evening   90 tablet   6   . ROSUVASTATIN CALCIUM 5 MG PO TABS   Oral   Take 5 mg by mouth every other day. Last dose taken on Monday           BP 127/69  Pulse 84  Temp 98 F (36.7 C) (Oral)  Resp 16  SpO2 97%  Physical Exam  Nursing note and vitals reviewed. Constitutional: She is oriented to person, place, and time. She appears well-developed and well-nourished. No distress.  HENT:  Head: Normocephalic and atraumatic.  Mouth/Throat: Oropharynx is clear and moist. No oropharyngeal exudate.  Eyes: Conjunctivae normal are normal. No scleral icterus.  Neck: Neck supple.  Cardiovascular: Normal rate and regular rhythm.  Exam reveals no gallop and no friction rub.        Mitral valve click. Impress heart sounds are regular  Pulmonary/Chest: Effort normal and breath sounds normal. No respiratory distress. She has no wheezes. She has no rales. She exhibits no tenderness.  Abdominal: Soft. She exhibits no distension. There is no rebound and no guarding.       No CVT Reported suprapubic tenderness with deep palpation. Bladder fundus not palpable.  Lymphadenopathy:    She has no cervical adenopathy.  Neurological: She is alert  and oriented to person, place, and time.  Skin: She is not diaphoretic.       No skin hematomas.    ED Course  Procedures (including critical care time)  Labs Reviewed    POCT URINALYSIS DIP (DEVICE) - Abnormal; Notable for the following:    Hgb urine dipstick LARGE (*)     Leukocytes, UA MODERATE (*)  Biochemical Testing Only. Please order routine urinalysis from main lab if confirmatory testing is needed.   All other components within normal limits  URINALYSIS, ROUTINE W REFLEX MICROSCOPIC  URINE CULTURE  PROTIME-INR   No results found.   1. UTI (lower urinary tract infection)   2. Hematuria       MDM  62 year old female on chronic anticoagulation with Coumadin. Here with urinary frequency and suprapubic discomfort and hematuria. Last pro time on December 12 was 3.2. Patient had recent antibiotic therapy with amoxicillin which could have enhanced Coumadin effect. Urine sent for micro and culture. Has positive LE on POC.  Asked to hold Coumadin tonight until I call back with INR results (blood was sent for protime test). Patient came to [facility] right before closing an INR it is a send out test that can take hours for me to see the result. Patient otherwise stable with reassuring physical exam I discharged patient home and will call with INR results and if/new recommendations. She also has a Coumadin clinic appointment tomorrow at 10:30 and I asked her to keep. Prescribed Keflex. Asked to go to the hospital if worsening or new symptoms like rectal or nose bleeding, dizziness, headache, chills or fever, abdominal pain nausea vomiting.  Note: I called patient to her cell phone and left a message about 1 hour after her discharge instructing to continue coumadin as scheduled as her INR was normal at 2.6. Also start keflex as prescribed.          Sharin Grave, MD 09/05/12 1105  Note: I was able to talked to patient over the phone feeling better already started to take keflex. No fever. Hematuria resolved. She got my message about continue coumadin as usual.  Sharin Grave, MD 09/05/12 1114

## 2012-09-03 NOTE — Telephone Encounter (Signed)
Called spoke with pt.  Pt states hematuria started this am, has had x 4-5 episodes, lighter at present.  Pt denies fever or pain with urination, but does have some frequency and urgency.  Offered pt appt to check INR tomorrow in Coumadin Clinic scheduled for 09/04/12 at 10:30am. Last INR [facility] 3.2 on 08/21/12.  Advised pt if bleeding increased before appt go to ED, pt agrees.

## 2012-09-05 LAB — URINE CULTURE: Special Requests: NORMAL

## 2012-09-08 NOTE — ED Notes (Signed)
Urine culture: >100,000 colonies E. Coli.  Pt. adequately treated with Keflex. Deborah [name] M 09/08/2012  

## 2012-09-18 ENCOUNTER — Ambulatory Visit (INDEPENDENT_AMBULATORY_CARE_PROVIDER_SITE_OTHER): Payer: Medicaid Other

## 2012-09-18 DIAGNOSIS — Z7901 Long term (current) use of anticoagulants: Secondary | ICD-10-CM

## 2012-09-18 DIAGNOSIS — Z954 Presence of other heart-valve replacement: Secondary | ICD-10-CM

## 2012-09-18 DIAGNOSIS — I059 Rheumatic mitral valve disease, unspecified: Secondary | ICD-10-CM

## 2012-09-18 LAB — POCT INR: INR: 3.5

## 2012-09-23 ENCOUNTER — Other Ambulatory Visit: Payer: Self-pay | Admitting: Cardiology

## 2012-10-06 ENCOUNTER — Other Ambulatory Visit: Payer: Self-pay | Admitting: Cardiology

## 2012-10-16 ENCOUNTER — Ambulatory Visit (INDEPENDENT_AMBULATORY_CARE_PROVIDER_SITE_OTHER): Payer: Medicaid Other

## 2012-10-16 DIAGNOSIS — Z7901 Long term (current) use of anticoagulants: Secondary | ICD-10-CM

## 2012-10-16 DIAGNOSIS — I059 Rheumatic mitral valve disease, unspecified: Secondary | ICD-10-CM

## 2012-10-16 DIAGNOSIS — Z954 Presence of other heart-valve replacement: Secondary | ICD-10-CM

## 2012-10-28 ENCOUNTER — Other Ambulatory Visit: Payer: Self-pay | Admitting: Cardiology

## 2012-11-13 ENCOUNTER — Ambulatory Visit (INDEPENDENT_AMBULATORY_CARE_PROVIDER_SITE_OTHER): Payer: Medicaid Other

## 2012-11-13 DIAGNOSIS — I059 Rheumatic mitral valve disease, unspecified: Secondary | ICD-10-CM

## 2012-11-13 DIAGNOSIS — Z7901 Long term (current) use of anticoagulants: Secondary | ICD-10-CM

## 2012-11-13 DIAGNOSIS — Z954 Presence of other heart-valve replacement: Secondary | ICD-10-CM

## 2012-11-17 ENCOUNTER — Ambulatory Visit (HOSPITAL_COMMUNITY)
Admission: RE | Admit: 2012-11-17 | Discharge: 2012-11-17 | Disposition: A | Discharge status: Home / Self Care | Payer: Medicaid Other | Source: Ambulatory Visit | Attending: Family Medicine | Admitting: Family Medicine

## 2012-11-17 ENCOUNTER — Other Ambulatory Visit (HOSPITAL_COMMUNITY): Payer: Self-pay | Admitting: Family Medicine

## 2012-11-17 DIAGNOSIS — M2550 Pain in unspecified joint: Secondary | ICD-10-CM

## 2012-11-17 DIAGNOSIS — M898X9 Other specified disorders of bone, unspecified site: Secondary | ICD-10-CM | POA: Insufficient documentation

## 2012-11-17 DIAGNOSIS — M79609 Pain in unspecified limb: Secondary | ICD-10-CM | POA: Insufficient documentation

## 2012-11-17 DIAGNOSIS — M19049 Primary osteoarthritis, unspecified hand: Secondary | ICD-10-CM | POA: Insufficient documentation

## 2012-12-01 ENCOUNTER — Emergency Department (HOSPITAL_COMMUNITY)
Admission: EM | Admit: 2012-12-01 | Discharge: 2012-12-01 | Disposition: A | Discharge status: Home / Self Care | Payer: Medicaid Other | Source: Home / Self Care

## 2012-12-01 ENCOUNTER — Encounter (HOSPITAL_COMMUNITY): Payer: Self-pay | Admitting: Emergency Medicine

## 2012-12-01 DIAGNOSIS — R05 Cough: Secondary | ICD-10-CM

## 2012-12-01 DIAGNOSIS — T7840XA Allergy, unspecified, initial encounter: Secondary | ICD-10-CM

## 2012-12-01 MED ORDER — HYDROXYZINE PAMOATE 100 MG PO CAPS: 100.0000 mg | ORAL_CAPSULE | Freq: Three times a day (TID) | ORAL | Status: DC | PRN

## 2012-12-01 MED ORDER — PREDNISONE (PAK) 10 MG PO TABS: 60.0000 mg | ORAL_TABLET | Freq: Every day | ORAL | Status: DC

## 2012-12-01 MED ORDER — HYDROCOD POLST-CHLORPHEN POLST 10-8 MG/5ML PO LQCR: 5.0000 mL | Freq: Two times a day (BID) | ORAL | Status: DC | PRN

## 2012-12-01 MED ORDER — BENZONATATE 100 MG PO CAPS: 100.0000 mg | ORAL_CAPSULE | Freq: Three times a day (TID) | ORAL | Status: DC | PRN

## 2012-12-01 NOTE — ED Notes (Signed)
Cough, sore throat and cough.  Onset of symptoms was last Tuesday.  11/25/2012.  [facility].  tx with amoxicillin and prednisone.  Patient had rash prior to starting these medicines.

## 2012-12-01 NOTE — ED Provider Notes (Signed)
History     CSN: [id number]  Arrival date & time 12/01/12  1105   First MD Initiated Contact with Patient 12/01/12 1117      Chief Complaint  Patient presents with  . URI    (Consider location/radiation/quality/duration/timing/severity/associated sxs/prior treatment) Patient is a 62 y.o. female presenting with URI.  URI Presenting symptoms: cough and sore throat   Associated symptoms: no wheezing    This is a 62 year old female who developed a rash and a dry cough about 5 days ago and was seen in the family practice clinic and prescribed 5 mg of prednisone x3 days and amoxicillin. She states that the rash and itching or worsening along with the cough. The cough is still nonproductive. She is here for further management. Itching is present in her scalp eyes ears and her throat. She states her her throat is sore from coughing cough is productive of whitish sputum which is sometimes pink tinged. Rash is present on her chest abdomen back and arms and is very pruritic Past Medical History  Diagnosis Date  . Rheumatic heart disease     Status post mechanical mitral valve replacement and tricuspid valve repair at [facility] in 2002; echo 5/12:   EF 60-65%, mild AI, mitral valve prosthesis with AVA 1.66, severe LAE, moderate RAE, moderate to severe TR, mild increased pulmonary artery systolic pressure;    Adenosine Cardiolite in 4/09:   No ischemia, EF 66%  . Chronic atrial fibrillation   . Chronic anticoagulation   . Fatigue   . Borderline diabetes   . History of mitral valve replacement   . Hyperlipidemia   . Obesity   . Anemia   . Palpitations   . Headache   . Gout   . Hypertension   . Carpal tunnel syndrome   . External hemorrhoids   . Rotator cuff syndrome   . 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 Tricuspid valve repair    . Cardiac catheterization      EF 55%    Family History   Problem Relation Age of Onset  . Stroke Mother   . Diabetes Mother   . Heart failure Mother   . Heart disease Father     "heart problems"  . Heart disease Sister     cardiac problems  . Heart disease Brother   . Gout Brother   . Diabetes Brother   . Stroke Brother     History  Substance Use Topics  . Smoking status: Never Smoker   . Smokeless tobacco: Never Used  . Alcohol Use: No    OB History   Grav Para Term Preterm Abortions TAB SAB Ect Mult Living                  Review of Systems  Constitutional: Negative.   HENT: Positive for sore throat.   Eyes: Positive for discharge and itching. Negative for photophobia, pain, redness and visual disturbance.  Respiratory: Positive for cough. Negative for apnea, choking, chest tightness, shortness of breath, wheezing and stridor.   Cardiovascular: Negative.   Gastrointestinal: Negative.   Genitourinary: Negative.   Musculoskeletal: Negative.   Skin: Positive for rash.  Neurological: Positive for light-headedness.  Hematological: Negative.   Psychiatric/Behavioral: Negative.     Allergies  Promethazine hcl and Promethazine hcl  Home Medications   Current Outpatient Rx  Name  Route  Sig  Dispense  Refill  . AMOXICILLIN PO   Oral   Take by mouth.         . predniSONE (STERAPRED UNI-PAK) 10 MG tablet   Oral   Take 10 mg by mouth daily.         Marland Kitchen acetaminophen (TYLENOL) 325 MG tablet   Oral   Take 650 mg by mouth every 6 (six) hours as needed.          Marland Kitchen allopurinol (ZYLOPRIM) 300 MG tablet   Oral   Take 300 mg by mouth as needed.          Marland Kitchen aspirin EC 81 MG tablet   Oral   Take 81 mg by mouth daily.         Marland Kitchen atorvastatin (LIPITOR) 20 MG tablet      TAKE 1 TABLET (20 MG TOTAL) BY MOUTH DAILY.   30 tablet   1   . benzonatate (TESSALON PERLES) 100 MG capsule   Oral   Take 1 capsule (100 mg total) by mouth 3 (three) times daily as needed for cough.   20 capsule   0   . benzonatate  (TESSALON) 100 MG capsule   Oral   Take 1 capsule (100 mg total) by mouth every 8 (eight) hours.   15 capsule   0   . cephALEXin (KEFLEX) 500 MG capsule   Oral   Take 1 capsule (500 mg total) by mouth 2 (two) times daily.   14 capsule   0   . chlorpheniramine-HYDROcodone (TUSSIONEX PENNKINETIC ER) 10-8 MG/5ML LQCR   Oral   Take 5 mLs by mouth every 12 (twelve) hours as needed.   140 mL   0   . COUMADIN 2 MG tablet      TAKE 1 TABLET BY MOUTH EVERY DAY OR AS DIRECTED   35 tablet   3     Dispense as written.    1   . COUMADIN 2 MG tablet      TAKE 1 TABLET BY MOUTH EVERY DAY OR AS DIRECTED   35 tablet   3     Dispense as written.   . folic acid (FOLVITE) 1 MG tablet   Oral   Take 1 mg by mouth daily.          . furosemide (LASIX) 40 MG tablet   Oral   Take 60 mg by mouth 2 (two) times daily.         . furosemide (LASIX) 40 MG tablet      TAKE 1.5 TABLETS BY MOUTH 2 TIMES DAILY.   90 tablet   6   . hydrOXYzine (VISTARIL) 100 MG capsule   Oral   Take 1 capsule (100 mg total) by mouth 3 (three) times daily as needed for itching.   30 capsule   0   . KLOR-CON M20 20 MEQ tablet      TAKE 2 IN THE MORNING AND 1 IN THE EVENING   90 tablet   6   . metoprolol succinate (TOPROL-XL) 50 MG 24 hr tablet      Take 1 tablet by mouth daily   30 tablet   3   . metoprolol succinate (TOPROL-XL) 50 MG 24 hr tablet      TAKE 1 TABLET BY MOUTH DAILY   30 tablet   6   . omeprazole (PRILOSEC) 20 MG capsule      TAKE ONE CAPSULE BY MOUTH EVERY DAY BEFORE A MEAL  30 capsule   5   . phenazopyridine (PYRIDIUM) 100 MG tablet   Oral   Take 2 tablets (200 mg total) by mouth 3 (three) times daily as needed for pain.   6 tablet   0   . predniSONE (STERAPRED UNI-PAK) 10 MG tablet   Oral   Take 6 tablets (60 mg total) by mouth daily. 60 mg for 2 days 40 mg for 2 days 20 mg for 2 days 10 mg for 2 days   26 tablet   0   . rosuvastatin (CRESTOR) 5 MG  tablet   Oral   Take 5 mg by mouth every other day. Last dose taken on Monday           BP 119/68  Pulse 79  Temp(Src) 98.2 F (36.8 C) (Oral)  Resp 20  SpO2 98%  Physical Exam  ED Course  Procedures (including critical care time)  Labs Reviewed - No data to display No results found.   1. Allergic reaction, initial encounter   2. Cough       MDM  Stronger course of steroids has been given in a tapering manner. In addition I have recommended Pepcid twice a day, Claritin or Zyrtec or both if itching persists. She is advised to take either over-the-counter Benadryl in addition if itching persists. If that is ineffective I have given her a prescription for Atarax to use instead of Benadryl. For her cough which I suspect is related to this allergic reaction I have prescribed cough suppressants. At this time it does not sound like a bacterial bronchitis to me. He is advised that if symptoms do not resolve or worsen she should followup with an allergist.        Calvert Cantor, MD 12/01/12 1254

## 2012-12-08 ENCOUNTER — Encounter: Payer: Self-pay | Admitting: *Deleted

## 2012-12-11 ENCOUNTER — Ambulatory Visit (INDEPENDENT_AMBULATORY_CARE_PROVIDER_SITE_OTHER): Payer: Medicaid Other | Admitting: Cardiology

## 2012-12-11 ENCOUNTER — Ambulatory Visit (INDEPENDENT_AMBULATORY_CARE_PROVIDER_SITE_OTHER): Payer: Medicaid Other

## 2012-12-11 ENCOUNTER — Encounter: Payer: Self-pay | Admitting: Cardiology

## 2012-12-11 VITALS — BP 126/78 | HR 74 | Ht 59.0 in | Wt 156.8 lb

## 2012-12-11 DIAGNOSIS — I059 Rheumatic mitral valve disease, unspecified: Secondary | ICD-10-CM

## 2012-12-11 DIAGNOSIS — I5032 Chronic diastolic (congestive) heart failure: Secondary | ICD-10-CM

## 2012-12-11 DIAGNOSIS — Z954 Presence of other heart-valve replacement: Secondary | ICD-10-CM

## 2012-12-11 DIAGNOSIS — I482 Chronic atrial fibrillation, unspecified: Secondary | ICD-10-CM

## 2012-12-11 DIAGNOSIS — Z7901 Long term (current) use of anticoagulants: Secondary | ICD-10-CM

## 2012-12-11 DIAGNOSIS — I4891 Unspecified atrial fibrillation: Secondary | ICD-10-CM

## 2012-12-11 DIAGNOSIS — Z9889 Other specified postprocedural states: Secondary | ICD-10-CM

## 2012-12-11 DIAGNOSIS — E785 Hyperlipidemia, unspecified: Secondary | ICD-10-CM

## 2012-12-11 DIAGNOSIS — I509 Heart failure, unspecified: Secondary | ICD-10-CM

## 2012-12-11 DIAGNOSIS — I2510 Atherosclerotic heart disease of native coronary artery without angina pectoris: Secondary | ICD-10-CM

## 2012-12-11 LAB — BASIC METABOLIC PANEL
BUN: 21 mg/dL (ref 6–23)
CO2: 28 mEq/L (ref 19–32)
Chloride: 103 mEq/L (ref 96–112)
Creatinine, Ser: 0.9 mg/dL (ref 0.4–1.2)
Glucose, Bld: 114 mg/dL — ABNORMAL HIGH (ref 70–99)
Potassium: 4.1 mEq/L (ref 3.5–5.1)

## 2012-12-11 LAB — LDL CHOLESTEROL, DIRECT: Direct LDL: 104.2 mg/dL

## 2012-12-11 LAB — LIPID PANEL
Cholesterol: 205 mg/dL — ABNORMAL HIGH (ref 0–200)
Total CHOL/HDL Ratio: 4
VLDL: 45 mg/dL — ABNORMAL HIGH (ref 0.0–40.0)

## 2012-12-11 MED ORDER — FUROSEMIDE 40 MG PO TABS: ORAL_TABLET | ORAL | Status: DC

## 2012-12-11 NOTE — Patient Instructions (Addendum)
Your physician recommends that you schedule a follow-up appointment in: 2 MONTHS WITH DR [facility]   INCREASE FUROSEMIDE TO TWO TABLETS TWICE DAILY X 5 DAYS THEN REDUCE TO ONE AND ONE HALF TABLETS ONCE DAILY  Your physician recommends that you HAVE LAB WORK TODAY

## 2012-12-11 NOTE — Progress Notes (Signed)
Patient ID: Deborah [name], female   DOB: 01-12-1951, 62 y.o.   MRN: [id number] PCP: Dala Dock, Dr. Neale Burly  62 yo with rheumatic heart disease and MV replacement/TV repair, chronic atrial fibrillation, and chronic diastolic CHF presents for cardiology followup.  Weight is down 3 lbs since last appointment.  No chest pain.  Recently, she has been having trouble with congestion and allergies.  She has taken prednisone and Claritin with several visits to urgent care.  Before she started feeling sick, she had been walking 30-45 minutes/day with no problems. Now, she gets short of breath after walking about 100 feet (attributes to congestion).  This has been for about 2 wks.  No orthopnea or PND.  The steroids did help her symptoms some.   ECG:  Atrial fibrillation  Labs (8/12): K 3.5, creatinine 0.98 Labs (10/12): K 3.8, creatinine 1.0, BNP 116 Labs (1/13): K 3.6, creatinine 0.9, LDL 85, HDL 62 Labs (9/13): K 3.9, creatinine 1.0  PMH: 1. Rheumatic heart disease: Status post mechanical MV replacement and tricuspid repair at [facility] in 2002.  Last echo 5/12 with EF 60-65%, mild AI, mechanical MV prosthesis with normal function, severe LAE, moderate RAE, moderate to severe TR, mildly elevated PA systolic pressure.  2. Chronic atrial fibrillation on coumadin.  Event montor (7/12-8/12) showed no high rate episodes (patient continuously in atrial fibrillation).  3. Chronic diastolic CHF 4. ETT-myoview (7/12): No evidence for ischemia or infarction.  5. Impaired fasting glucose 6. Hyperlipidemia  7. HTN 8. Gout 9. Anemia 10. External hemorrhoids.  11. 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   SH: Nonsmoker. Lives in [place].   FH: No premature CAD  ROS: All systems reviewed and negative except as per HPI.   Current Outpatient Prescriptions  Medication Sig Dispense Refill  . acetaminophen (TYLENOL) 325 MG tablet Take 650 mg by mouth every 6 (six) hours as  needed.       Marland Kitchen allopurinol (ZYLOPRIM) 300 MG tablet Take 300 mg by mouth as needed.       Marland Kitchen aspirin EC 81 MG tablet Take 81 mg by mouth daily.      . benzonatate (TESSALON PERLES) 100 MG capsule Take 1 capsule (100 mg total) by mouth 3 (three) times daily as needed for cough.  20 capsule  0  . chlorpheniramine-HYDROcodone (TUSSIONEX PENNKINETIC ER) 10-8 MG/5ML LQCR Take 5 mLs by mouth every 12 (twelve) hours as needed.  140 mL  0  . COUMADIN 2 MG tablet TAKE 1 TABLET BY MOUTH EVERY DAY OR AS DIRECTED  35 tablet  3  . folic acid (FOLVITE) 1 MG tablet Take 1 mg by mouth daily.       . furosemide (LASIX) 40 MG tablet TAKE TWO TABLETS TWICE DAILY X 5 DAYS THEN 1 AND 1/2  TABLET DAILY  90 tablet  6  . hydrOXYzine (VISTARIL) 100 MG capsule Take 1 capsule (100 mg total) by mouth 3 (three) times daily as needed for itching.  30 capsule  0  . KLOR-CON M20 20 MEQ tablet TAKE 2 IN THE MORNING AND 1 IN THE EVENING  90 tablet  6  . metoprolol succinate (TOPROL-XL) 50 MG 24 hr tablet TAKE 1 TABLET BY MOUTH DAILY  30 tablet  6  . omeprazole (PRILOSEC) 20 MG capsule TAKE ONE CAPSULE BY MOUTH EVERY DAY BEFORE A MEAL  30 capsule  5  . rosuvastatin (CRESTOR) 5 MG tablet Take 5 mg by mouth every other day. Last dose  taken on Monday      . loratadine (CLARITIN) 10 MG tablet Take 10 mg by mouth daily.       No current facility-administered medications for this visit.    BP 126/78  Pulse 74  Ht 4\' 11"  (1.499 m)  Wt 156 lb 12.8 oz (71.124 kg)  BMI 31.65 kg/m2 General: NAD Neck: JVP 8 cm, no thyromegaly or thyroid nodule.  Lungs: Clear to auscultation bilaterally with normal respiratory effort. CV: Nondisplaced PMI.  Heart irregular S1/S2, no S3/S4, mechanical S2, 1/6 HSM LLSB.  No peripheral edema.  No carotid bruit.  Normal pedal pulses.  Abdomen: Soft, nontender, no hepatosplenomegaly, no distention.  Neurologic: Alert and oriented x 3.  Psych: Normal affect. Extremities: No clubbing or cyanosis.    Assessment/Plan: 1. Atrial fibrillation: Chronic.  Continue warfarin, Toprol XL.  Rate control is good.  2. Mechanical aortic valve: Continue ASA/coumadin, INR goal 2.5-3.5.  Will need bridging with heparin/Lovenox if has to come off coumadin in the future for procedure.  3. Diastolic CHF: Chronic.  May be a little volume overloaded today though I think her symptoms may be more related to allergic rhinitis (significant).  I will have her increase Lasix to 80 mg bid x 5 days, then back to 60 mg bid.  She needs to watch sodium intake closely.  Check BMET/BNP today. 4. Hyperlipidemia: Check lipids today.   Marca Ancona 12/11/2012 10:28 AM

## 2012-12-12 ENCOUNTER — Encounter (HOSPITAL_COMMUNITY): Payer: Self-pay | Admitting: *Deleted

## 2012-12-12 ENCOUNTER — Inpatient Hospital Stay (HOSPITAL_COMMUNITY)
Admission: EM | Admit: 2012-12-12 | Discharge: 2012-12-16 | DRG: 194 | Disposition: A | Discharge status: Home / Self Care | Payer: Medicaid Other | Attending: Internal Medicine | Admitting: Internal Medicine

## 2012-12-12 ENCOUNTER — Emergency Department (HOSPITAL_COMMUNITY): Payer: Medicaid Other

## 2012-12-12 ENCOUNTER — Telehealth: Payer: Self-pay | Admitting: Cardiology

## 2012-12-12 DIAGNOSIS — M2550 Pain in unspecified joint: Secondary | ICD-10-CM | POA: Diagnosis present

## 2012-12-12 DIAGNOSIS — I4891 Unspecified atrial fibrillation: Secondary | ICD-10-CM | POA: Diagnosis present

## 2012-12-12 DIAGNOSIS — J189 Pneumonia, unspecified organism: Principal | ICD-10-CM | POA: Diagnosis present

## 2012-12-12 DIAGNOSIS — I509 Heart failure, unspecified: Secondary | ICD-10-CM

## 2012-12-12 DIAGNOSIS — R7309 Other abnormal glucose: Secondary | ICD-10-CM

## 2012-12-12 DIAGNOSIS — I079 Rheumatic tricuspid valve disease, unspecified: Secondary | ICD-10-CM

## 2012-12-12 DIAGNOSIS — M109 Gout, unspecified: Secondary | ICD-10-CM | POA: Diagnosis present

## 2012-12-12 DIAGNOSIS — I0981 Rheumatic heart failure: Secondary | ICD-10-CM | POA: Diagnosis present

## 2012-12-12 DIAGNOSIS — I099 Rheumatic heart disease, unspecified: Secondary | ICD-10-CM

## 2012-12-12 DIAGNOSIS — E669 Obesity, unspecified: Secondary | ICD-10-CM | POA: Diagnosis present

## 2012-12-12 DIAGNOSIS — I959 Hypotension, unspecified: Secondary | ICD-10-CM | POA: Diagnosis present

## 2012-12-12 DIAGNOSIS — Z954 Presence of other heart-valve replacement: Secondary | ICD-10-CM

## 2012-12-12 DIAGNOSIS — Z7901 Long term (current) use of anticoagulants: Secondary | ICD-10-CM

## 2012-12-12 DIAGNOSIS — R17 Unspecified jaundice: Secondary | ICD-10-CM

## 2012-12-12 DIAGNOSIS — I5032 Chronic diastolic (congestive) heart failure: Secondary | ICD-10-CM | POA: Diagnosis present

## 2012-12-12 DIAGNOSIS — E785 Hyperlipidemia, unspecified: Secondary | ICD-10-CM | POA: Diagnosis present

## 2012-12-12 DIAGNOSIS — I059 Rheumatic mitral valve disease, unspecified: Secondary | ICD-10-CM

## 2012-12-12 DIAGNOSIS — N390 Urinary tract infection, site not specified: Secondary | ICD-10-CM | POA: Diagnosis present

## 2012-12-12 DIAGNOSIS — R079 Chest pain, unspecified: Secondary | ICD-10-CM

## 2012-12-12 DIAGNOSIS — K219 Gastro-esophageal reflux disease without esophagitis: Secondary | ICD-10-CM

## 2012-12-12 DIAGNOSIS — D509 Iron deficiency anemia, unspecified: Secondary | ICD-10-CM

## 2012-12-12 DIAGNOSIS — I482 Chronic atrial fibrillation, unspecified: Secondary | ICD-10-CM

## 2012-12-12 DIAGNOSIS — G5600 Carpal tunnel syndrome, unspecified upper limb: Secondary | ICD-10-CM

## 2012-12-12 DIAGNOSIS — R6510 Systemic inflammatory response syndrome (SIRS) of non-infectious origin without acute organ dysfunction: Secondary | ICD-10-CM | POA: Diagnosis present

## 2012-12-12 DIAGNOSIS — Z7982 Long term (current) use of aspirin: Secondary | ICD-10-CM

## 2012-12-12 DIAGNOSIS — Z6830 Body mass index (BMI) 30.0-30.9, adult: Secondary | ICD-10-CM

## 2012-12-12 DIAGNOSIS — R300 Dysuria: Secondary | ICD-10-CM | POA: Diagnosis present

## 2012-12-12 LAB — POCT I-STAT TROPONIN I: Troponin i, poc: 0 ng/mL (ref 0.00–0.08)

## 2012-12-12 LAB — URINALYSIS, ROUTINE W REFLEX MICROSCOPIC
Bilirubin Urine: NEGATIVE
Ketones, ur: NEGATIVE mg/dL
Nitrite: NEGATIVE
Specific Gravity, Urine: 1.005 (ref 1.005–1.030)
Urobilinogen, UA: 0.2 mg/dL (ref 0.0–1.0)

## 2012-12-12 LAB — BASIC METABOLIC PANEL
Calcium: 9.1 mg/dL (ref 8.4–10.5)
GFR calc Af Amer: 63 mL/min — ABNORMAL LOW (ref >90)
GFR calc non Af Amer: 54 mL/min — ABNORMAL LOW (ref >90)
Glucose, Bld: 148 mg/dL — ABNORMAL HIGH (ref 70–99)
Potassium: 3.8 mEq/L (ref 3.5–5.1)
Sodium: 132 mEq/L — ABNORMAL LOW (ref 135–145)

## 2012-12-12 LAB — CBC WITH DIFFERENTIAL/PLATELET
Basophils Absolute: 0 10*3/uL (ref 0.0–0.1)
HCT: 35.9 % — ABNORMAL LOW (ref 36.0–46.0)
Hemoglobin: 12.1 g/dL (ref 12.0–15.0)
Lymphocytes Relative: 5 % — ABNORMAL LOW (ref 12–46)
Lymphs Abs: 0.7 10*3/uL (ref 0.7–4.0)
Monocytes Absolute: 0.8 10*3/uL (ref 0.1–1.0)
Neutro Abs: 10.6 10*3/uL — ABNORMAL HIGH (ref 1.7–7.7)
RBC: 5.11 MIL/uL (ref 3.87–5.11)
RDW: 15.4 % (ref 11.5–15.5)
WBC: 12.7 10*3/uL — ABNORMAL HIGH (ref 4.0–10.5)

## 2012-12-12 MED ORDER — SODIUM CHLORIDE 0.9 % IV BOLUS (SEPSIS)
500.0000 mL | Freq: Once | INTRAVENOUS | Status: AC
  Administered 2012-12-12: 500 mL via INTRAVENOUS

## 2012-12-12 MED ORDER — SODIUM CHLORIDE 0.9 % IJ SOLN
3.0000 mL | Freq: Two times a day (BID) | INTRAMUSCULAR | Status: DC
  Administered 2012-12-12 – 2012-12-16 (×8): 3 mL via INTRAVENOUS

## 2012-12-12 MED ORDER — SODIUM CHLORIDE 0.9 % IV SOLN
INTRAVENOUS | Status: DC
  Administered 2012-12-12 – 2012-12-13 (×2): via INTRAVENOUS

## 2012-12-12 MED ORDER — DEXTROSE 5 % IV SOLN
1.0000 g | Freq: Once | INTRAVENOUS | Status: AC
  Administered 2012-12-12: 1 g via INTRAVENOUS
  Filled 2012-12-12: qty 10

## 2012-12-12 MED ORDER — ALLOPURINOL 300 MG PO TABS
300.0000 mg | ORAL_TABLET | Freq: Every day | ORAL | Status: DC
  Filled 2012-12-12: qty 1

## 2012-12-12 MED ORDER — HYDROXYZINE PAMOATE 50 MG PO CAPS
100.0000 mg | ORAL_CAPSULE | Freq: Three times a day (TID) | ORAL | Status: DC | PRN
  Filled 2012-12-12: qty 2

## 2012-12-12 MED ORDER — FOLIC ACID 1 MG PO TABS
1.0000 mg | ORAL_TABLET | Freq: Every day | ORAL | Status: DC
  Administered 2012-12-13 – 2012-12-16 (×4): 1 mg via ORAL
  Filled 2012-12-12 (×4): qty 1

## 2012-12-12 MED ORDER — BENZONATATE 100 MG PO CAPS
100.0000 mg | ORAL_CAPSULE | Freq: Three times a day (TID) | ORAL | Status: DC | PRN
  Filled 2012-12-12: qty 1

## 2012-12-12 MED ORDER — PANTOPRAZOLE SODIUM 40 MG PO TBEC
40.0000 mg | DELAYED_RELEASE_TABLET | Freq: Every day | ORAL | Status: DC
  Administered 2012-12-12 – 2012-12-16 (×5): 40 mg via ORAL
  Filled 2012-12-12 (×5): qty 1

## 2012-12-12 MED ORDER — ATORVASTATIN CALCIUM 10 MG PO TABS
10.0000 mg | ORAL_TABLET | Freq: Every day | ORAL | Status: DC
  Administered 2012-12-13 – 2012-12-16 (×4): 10 mg via ORAL
  Filled 2012-12-12 (×5): qty 1

## 2012-12-12 MED ORDER — ACETAMINOPHEN 325 MG PO TABS
650.0000 mg | ORAL_TABLET | Freq: Four times a day (QID) | ORAL | Status: DC | PRN
  Administered 2012-12-13 (×2): 650 mg via ORAL
  Filled 2012-12-12 (×2): qty 2

## 2012-12-12 MED ORDER — METOPROLOL SUCCINATE ER 50 MG PO TB24
50.0000 mg | ORAL_TABLET | Freq: Every day | ORAL | Status: DC
  Filled 2012-12-12: qty 1

## 2012-12-12 MED ORDER — WARFARIN SODIUM 2 MG PO TABS
2.0000 mg | ORAL_TABLET | ORAL | Status: AC
  Administered 2012-12-12: 2 mg via ORAL
  Filled 2012-12-12: qty 1

## 2012-12-12 MED ORDER — LORATADINE 10 MG PO TABS
10.0000 mg | ORAL_TABLET | Freq: Every day | ORAL | Status: DC
  Administered 2012-12-13 – 2012-12-16 (×4): 10 mg via ORAL
  Filled 2012-12-12 (×4): qty 1

## 2012-12-12 MED ORDER — HYDROMORPHONE HCL PF 1 MG/ML IJ SOLN
1.0000 mg | INTRAMUSCULAR | Status: DC | PRN
  Administered 2012-12-13: 0.5 mg via INTRAVENOUS

## 2012-12-12 MED ORDER — HYDROCOD POLST-CHLORPHEN POLST 10-8 MG/5ML PO LQCR: 5.0000 mL | Freq: Two times a day (BID) | ORAL | Status: DC | PRN

## 2012-12-12 MED ORDER — ASPIRIN EC 81 MG PO TBEC
81.0000 mg | DELAYED_RELEASE_TABLET | Freq: Every day | ORAL | Status: DC
  Administered 2012-12-13 – 2012-12-16 (×4): 81 mg via ORAL
  Filled 2012-12-12 (×4): qty 1

## 2012-12-12 MED ORDER — WARFARIN - PHARMACIST DOSING INPATIENT
Freq: Every day | Status: DC
  Administered 2012-12-16: 18:00:00

## 2012-12-12 MED ORDER — HYDROMORPHONE HCL PF 1 MG/ML IJ SOLN
INTRAMUSCULAR | Status: AC
  Filled 2012-12-12: qty 1

## 2012-12-12 MED ORDER — COLCHICINE 0.6 MG PO TABS
0.6000 mg | ORAL_TABLET | Freq: Once | ORAL | Status: AC
  Administered 2012-12-13: 0.6 mg via ORAL
  Filled 2012-12-12: qty 1

## 2012-12-12 NOTE — ED Notes (Signed)
Admit Doctor at bedside.  

## 2012-12-12 NOTE — ED Provider Notes (Signed)
History     CSN: [id number]  Arrival date & time 12/12/12  1506   First MD Initiated Contact with Patient 12/12/12 1601      Chief Complaint  Patient presents with  . Chest Pain  . Headache    (Consider location/radiation/quality/duration/timing/severity/associated sxs/prior treatment) HPI Comments: 29 y F with PMH of RHD (s/p MVR and TV repair) and afib on Coumadin, angioplasty with no stent, CHF, borderline DM, HLD, HTN and obesity here for eval 2/2 multiple complaints including fever, chest pain and dysuria. Tm 102 last evening, dysuria for 2 days.  She reports CP began last night, as well, and describes it as a heaviness and a/w nausea.  No diaphoresis.  Pain is not prior to similar cardiac events.  Lasix increased to 80 mg BID yesterday by her Cardiologist.  She took APAP with resolution of her fever.   Patient is a 62 y.o. female presenting with chest pain. The history is provided by the patient.  Chest Pain Pain location:  Substernal area Pain quality: pressure   Pain radiates to:  L shoulder Pain radiates to the back: no   Pain severity:  Mild Onset quality:  Gradual Duration: since last night. Timing:  Constant Ineffective treatments: APAP. Associated symptoms: fever, nausea and shortness of breath   Associated symptoms: no abdominal pain, no cough, no diaphoresis and not vomiting   Fever:    Fever duration: last night.   Max temp PTA (F):  102   Temp source:  Oral   Past Medical History  Diagnosis Date  . 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 Chronic atrial fibrillation   . Chronic anticoagulation   . Fatigue   . Borderline diabetes   . History of mitral  valve replacement   . Hyperlipidemia   . Obesity   . Anemia   . Palpitations   . Headache   . Gout   . Hypertension   . Carpal tunnel syndrome   . External hemorrhoids   . Rotator cuff syndrome   . 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 CHF (congestive heart failure)     Past Surgical History  Procedure Laterality Date  . Tricuspid valve repair    . Cardiac catheterization      EF 55%    Family History  Problem Relation Age of Onset  . Stroke Mother   . Diabetes Mother   . Heart failure Mother   . Heart disease Father     "heart problems"  . Heart disease Sister   . Heart disease Brother   . Gout Brother   . Diabetes Brother   . Stroke Brother   . Valvular heart disease Sister   .       History  Substance Use Topics  . Smoking status: Never Smoker   . Smokeless tobacco: Never Used  . Alcohol Use: No    OB History   Grav Para Term Preterm Abortions TAB SAB Ect Mult Living                  Review of Systems  Constitutional: Positive for fever and  chills. Negative for diaphoresis.  Respiratory: Positive for chest tightness and shortness of breath. Negative for cough.   Cardiovascular: Positive for chest pain.  Gastrointestinal: Positive for nausea. Negative for vomiting and abdominal pain.  All other systems reviewed and are negative.    Allergies  Promethazine hcl and Promethazine hcl  Home Medications   Current Outpatient Rx  Name  Route  Sig  Dispense  Refill  . acetaminophen (TYLENOL) 325 MG tablet   Oral   Take 650 mg by mouth every 6 (six) hours as needed.          Marland Kitchen allopurinol (ZYLOPRIM) 300 MG tablet   Oral   Take 300 mg by mouth as needed.          Marland Kitchen aspirin EC 81 MG tablet   Oral   Take 81 mg by mouth daily.         . benzonatate (TESSALON PERLES) 100 MG capsule   Oral   Take 1 capsule (100 mg total) by mouth 3 (three) times daily as needed for cough.   20 capsule   0   .  chlorpheniramine-HYDROcodone (TUSSIONEX PENNKINETIC ER) 10-8 MG/5ML LQCR   Oral   Take 5 mLs by mouth every 12 (twelve) hours as needed.   140 mL   0   . COUMADIN 2 MG tablet      TAKE 1 TABLET BY MOUTH EVERY DAY OR AS DIRECTED   35 tablet   3     Dispense as written.    1   . folic acid (FOLVITE) 1 MG tablet   Oral   Take 1 mg by mouth daily.          . furosemide (LASIX) 40 MG tablet      TAKE TWO TABLETS TWICE DAILY X 5 DAYS THEN 1 AND 1/2  TABLET DAILY   90 tablet   6   . hydrOXYzine (VISTARIL) 100 MG capsule   Oral   Take 1 capsule (100 mg total) by mouth 3 (three) times daily as needed for itching.   30 capsule   0   . KLOR-CON M20 20 MEQ tablet      TAKE 2 IN THE MORNING AND 1 IN THE EVENING   90 tablet   6   . loratadine (CLARITIN) 10 MG tablet   Oral   Take 10 mg by mouth daily.         . metoprolol succinate (TOPROL-XL) 50 MG 24 hr tablet      TAKE 1 TABLET BY MOUTH DAILY   30 tablet   6   . omeprazole (PRILOSEC) 20 MG capsule      TAKE ONE CAPSULE BY MOUTH EVERY DAY BEFORE A MEAL   30 capsule   5   . rosuvastatin (CRESTOR) 5 MG tablet   Oral   Take 5 mg by mouth every other day. Last dose taken on Monday           Pulse 95  Temp(Src) 98.2 F (36.8 C) (Oral)  Resp 20  Ht 4\' 11"  (1.499 m)  Wt 159 lb (72.122 kg)  BMI 32.1 kg/m2  SpO2 100%  Physical Exam  Vitals reviewed. Constitutional: She is oriented to person, place, and time. She appears well-developed and well-nourished.  HENT:  Right Ear: External ear normal.  Left Ear: External ear normal.  Mouth/Throat: No oropharyngeal exudate.  Eyes: Conjunctivae and EOM are normal. Pupils are equal, round, and reactive to light.  Neck: Normal  range of motion. Neck supple.  Cardiovascular: Normal rate, normal heart sounds and intact distal pulses.  Exam reveals no gallop and no friction rub.   No murmur heard. Irregular rhythm  Pulmonary/Chest: Effort normal and breath sounds  normal.  Abdominal: Soft. Bowel sounds are normal. She exhibits no distension. There is no tenderness.  Musculoskeletal: Normal range of motion. She exhibits edema (trace).  Neurological: She is alert and oriented to person, place, and time. No cranial nerve deficit.  Skin: Skin is warm and dry. No rash noted.  Psychiatric: She has a normal mood and affect.    ED Course  Procedures (including critical care time)  Labs Reviewed  PRO B NATRIURETIC PEPTIDE - Abnormal; Notable for the following:    Pro B Natriuretic peptide (BNP) 1611.0 (*)    All other components within normal limits  BASIC METABOLIC PANEL - Abnormal; Notable for the following:    Sodium 132 (*)    Glucose, Bld 148 (*)    GFR calc non Af Amer 54 (*)    GFR calc Af Amer 63 (*)    All other components within normal limits  CBC WITH DIFFERENTIAL - Abnormal; Notable for the following:    WBC 12.7 (*)    HCT 35.9 (*)    MCV 70.3 (*)    MCH 23.7 (*)    Neutrophils Relative 84 (*)    Neutro Abs 10.6 (*)    Lymphocytes Relative 5 (*)    All other components within normal limits  PROTIME-INR - Abnormal; Notable for the following:    Prothrombin Time 25.4 (*)    INR 2.44 (*)    All other components within normal limits  URINALYSIS, ROUTINE W REFLEX MICROSCOPIC - Abnormal; Notable for the following:    APPearance HAZY (*)    Hgb urine dipstick SMALL (*)    Leukocytes, UA MODERATE (*)    All other components within normal limits  URINE MICROSCOPIC-ADD ON - Abnormal; Notable for the following:    Squamous Epithelial / LPF FEW (*)    Bacteria, UA FEW (*)    All other components within normal limits  URINE CULTURE  CG4 I-STAT (LACTIC ACID)   Dg Chest 2 View  12/12/2012  *RADIOLOGY REPORT*  Clinical Data: Chest pain.  CHEST - 2 VIEW  Comparison: 10/02/2011.  Findings: The heart is enlarged but stable.  There are surgical changes from bypass surgery.  The lungs are clear except for chronic left basilar scarring and mild  pleural thickening.  No edema or pneumothorax.  The bony thorax is intact.  IMPRESSION: Chronic left basilar scarring changes. Stable cardiac enlargement. No infiltrates or effusions.   Original Report Authenticated By: Rudie Meyer, M.D.    Date: 12/12/2012  Rate: 83  Rhythm: atrial fibrillation  QRS Axis: normal  Intervals: normal  ST/T Wave abnormalities: normal  Conduction Disutrbances:none  Narrative Interpretation: Atrial fibrillation.  Old EKG Reviewed: When compared with ECG of 06/26/2005, no significant changes are seen.     1. UTI (lower urinary tract infection)   2. Acute exacerbation of CHF (congestive heart failure)   3. Chest pain   4. Dysuria   5. Chronic anticoagulation   6. Hypotension       MDM   57 y F with PMH of RHD (s/p MVR and TV repair) and afib on Coumadin, angioplasty with no stent, CHF, borderline DM, HLD, HTN and obesity here for eval 2/2 multiple complaints including fever, chest pain and dysuria. Tm 102  last evening, dysuria for 2 days.  She reports CP began last night, as well, and describes it as a heaviness and a/w nausea.  No diaphoresis.  Pain is not prior to similar cardiac events.  Lasix increased to 80 mg BID yesterday by her Cardiologist.  She took APAP with resolution of her fever.  Afebrile here, BPs 90s/60s, mentating well, well perfused.  Lungs clear.  Trace edema.  Abd soft, NT, no CVAT.  Diff Dx: UTI, ACS, PNA, sepsis, CHF exacerbation.  500 cc bolus, lactate, CBC, BMP, bnp, CXR.  EKG with afib, otherwise no acute findings.  7:30 PM Cardiologist consulted but feel she would be better served on a Medicine service.  Medicine consulted for admission for CHF exacerbation, UTI and ACS r/o.  Disposition: Admit  Condition: Stable  Pt seen in conjunction with my attending, Dr. Preston Fleeting.Oleh Genin, MD PGY-II [facility] Emergency Medicine Resident      Oleh Genin, MD 12/13/12 [phone]

## 2012-12-12 NOTE — ED Notes (Addendum)
Pt was seen by Dr Jearld Pies yesterday and told she is retaining fluid.  Pt was given lasix.  Last night began experiencing L sided chest pressure and increased facial and hand swelling.  Pt also c/o of fever of 102 at home.  Last took acet at 1600.

## 2012-12-12 NOTE — ED Provider Notes (Signed)
62 year old female with known atrial fibrillation and rheumatic heart disease status post valve replacement was seen by her cardiologist yesterday who noted some mild fluid retention and increase her Lasix dose. Last night, she started developing heaviness in her chest along with the pre-existing dyspnea. There is no nausea or diaphoresis. Symptoms are nonexertional. On exam, there is no neck vein distention, lungs are clear, heart is irregular. Extremities have perhaps slight trace edema. Old records are reviewed and BNP has risen from normal yesterday, 09/1098 today. She definitely seems to have evidence of congestive heart failure and there is concern regarding her chest pressure whether that is related to cardiac ischemia. ECG shows no evidence of ischemia troponin is normal. She will need to be admitted for serial cardiac markers and diuresis.   Date: 12/12/2012  Rate: 83  Rhythm: atrial fibrillation  QRS Axis: normal  Intervals: normal  ST/T Wave abnormalities: normal  Conduction Disutrbances:none  Narrative Interpretation: Atrial fibrillation. When compared with ECG of 06/26/2005, no significant changes are seen.  Old EKG Reviewed: unchanged  I saw and evaluated the patient, reviewed the resident's note and I agree with the findings and plan.   Dione Booze, MD 12/12/12 [phone]

## 2012-12-12 NOTE — H&P (Addendum)
[facility] History and Physical  Deborah [name] ZOX:[id number] DOB: [birth year] DOA: 12/12/2012  Referring physician: ED PCP: Julieanne Manson, MD  Specialists: None  Chief Complaint: Dysuria, fever, chest heaviness  HPI: Deborah [name] is a 62 y.o. female pmh rheumatic heart disease, CHF NYHA class 3-4, A.fib, mechanical heart valve.  Presents to ED after developing fever, chills, and dysuria over night.  Patient came to ED for symptoms.  In ED patient was indeed found to have a UTI and be slightly hypotensive but responsive to fluids.  Hospitalist has been asked to admit.  Review of Systems: 12 systems reviewed and otherwise negative.  Past Medical History  Diagnosis Date  . Rheumatic heart disease     Status post mechanical (St. Jude) mitral valve replacement and tricuspid valve repair at [facility] in 2002; echo 5/12:   EF 60-65%, mild AI, mitral valve prosthesis with AVA 1.66, severe LAE, moderate RAE, moderate to severe TR, mild increased pulmonary artery systolic pressure;    Adenosine Cardiolite in 4/09:   No ischemia, EF 66%  . Chronic atrial fibrillation   . Chronic anticoagulation   . Fatigue   . Borderline diabetes   . History of mitral valve replacement 2002    St. Jude  . Hyperlipidemia   . Obesity   . Anemia   . Palpitations   . Headache   . Gout   . Hypertension   . Carpal tunnel syndrome   . External hemorrhoids   . Rotator cuff syndrome   . 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 CHF (congestive heart failure)     nl EF   Past Surgical History  Procedure Laterality Date  . Tricuspid valve repair    . Cardiac catheterization      EF 55%   Social History:  reports that she has never smoked. She has never used smokeless tobacco. She reports that she does not drink alcohol or use illicit drugs.   Allergies  Allergen Reactions  . Promethazine Hcl     REACTION: made her real shaky  . Promethazine Hcl      Family History  Problem Relation Age of Onset  . Stroke Mother   . Diabetes Mother   . Heart failure Mother   . Heart disease Father     "heart problems"  . Heart disease Sister   . Heart disease Brother   . Gout Brother   . Diabetes Brother   . Stroke Brother   . Valvular heart disease Sister   .       Prior to Admission medications   Medication Sig Start Date End Date Taking? Authorizing Provider  acetaminophen (TYLENOL) 325 MG tablet Take 650 mg by mouth every 6 (six) hours as needed for pain.    Yes Historical Provider, MD  allopurinol (ZYLOPRIM) 300 MG tablet Take 300 mg by mouth daily.    Yes Historical Provider, MD  aspirin EC 81 MG tablet Take 81 mg by mouth daily.   Yes Historical Provider, MD  benzonatate (TESSALON) 100 MG capsule Take 100 mg by mouth 3 (three) times daily as needed for cough. 12/01/12  Yes Calvert Cantor, MD  chlorpheniramine-HYDROcodone (TUSSIONEX) 10-8 MG/5ML LQCR Take 5 mLs by mouth every 12 (twelve) hours as needed (cough). 12/01/12  Yes Calvert Cantor, MD  folic acid (FOLVITE) 1 MG tablet Take 1 mg by mouth daily.    Yes Historical Provider, MD  furosemide (LASIX) 80 MG tablet Take 80 mg  by mouth 2 (two) times daily.   Yes Historical Provider, MD  hydrOXYzine (VISTARIL) 100 MG capsule Take 100 mg by mouth 3 (three) times daily as needed for itching. 12/01/12  Yes Calvert Cantor, MD  loratadine (CLARITIN) 10 MG tablet Take 10 mg by mouth daily.   Yes Historical Provider, MD  metoprolol succinate (TOPROL-XL) 50 MG 24 hr tablet Take 50 mg by mouth daily. Take with or immediately following a meal.   Yes Historical Provider, MD  omeprazole (PRILOSEC) 20 MG capsule Take 20 mg by mouth daily before breakfast.   Yes Historical Provider, MD  potassium chloride SA (K-DUR,KLOR-CON) 20 MEQ tablet Take 20-40 mEq by mouth 2 (two) times daily. 2 tabs in the am, 1 tab in the pm   Yes Historical Provider, MD  rosuvastatin (CRESTOR) 5 MG tablet Take 5 mg by mouth every other  day.    Yes Historical Provider, MD  warfarin (COUMADIN) 2 MG tablet Take 1-2 mg by mouth every evening. Monday only-0.5 (1 mg total) half tab; All other days 1 tab (2 mg total)   Yes Historical Provider, MD   Physical Exam: Filed Vitals:   12/12/12 1915 12/12/12 1945 12/12/12 1955 12/12/12 2000  BP: 97/56 107/73 95/74 114/78  Pulse: 81 81 91 97  Temp:  98.8 F (37.1 C)    TempSrc:  Oral    Resp: 18 20 22 20   Height:      Weight:      SpO2: 100% 100% 97% 100%     General:  NAD, resting comfortably in bed  Eyes: PEERLA EOMI  ENT: mucous membranes moist  Neck: supple mild JVD  Cardiovascular: irregularly irregular, mechanical heart valve  Respiratory: CTA B  Abdomen: soft, nt, nd, bs+  Skin: no rash nor lesion  Musculoskeletal: MAE, full ROM all 4 extremities  Psychiatric: normal tone and affect  Neurologic: AAOx3, grossly non-focal  Labs on Admission:  Basic Metabolic Panel:  Recent Labs Lab 12/11/12 0903 12/12/12 1546  NA 139 132*  K 4.1 3.8  CL 103 97  CO2 28 25  GLUCOSE 114* 148*  BUN 21 18  CREATININE 0.9 1.09  CALCIUM 8.8 9.1   Liver Function Tests: No results found for this basename: AST, ALT, ALKPHOS, BILITOT, PROT, ALBUMIN,  in the last 168 hours No results found for this basename: LIPASE, AMYLASE,  in the last 168 hours No results found for this basename: AMMONIA,  in the last 168 hours CBC:  Recent Labs Lab 12/12/12 1546  WBC 12.7*  NEUTROABS 10.6*  HGB 12.1  HCT 35.9*  MCV 70.3*  PLT 198   Cardiac Enzymes: No results found for this basename: CKTOTAL, CKMB, CKMBINDEX, TROPONINI,  in the last 168 hours  BNP (last 3 results)  Recent Labs  03/24/12 0938 12/11/12 0903 12/12/12 1546  PROBNP 90.0 96.0 1611.0*   CBG: No results found for this basename: GLUCAP,  in the last 168 hours  Radiological Exams on Admission: Dg Chest 2 View  12/12/2012  *RADIOLOGY REPORT*  Clinical Data: Chest pain.  CHEST - 2 VIEW  Comparison:  10/02/2011.  Findings: The heart is enlarged but stable.  There are surgical changes from bypass surgery.  The lungs are clear except for chronic left basilar scarring and mild pleural thickening.  No edema or pneumothorax.  The bony thorax is intact.  IMPRESSION: Chronic left basilar scarring changes. Stable cardiac enlargement. No infiltrates or effusions.   Original Report Authenticated By: Rudie Meyer, M.D.  EKG: Independently reviewed.  Assessment/Plan Principal Problem:   UTI (lower urinary tract infection) Active Problems:   Chronic atrial fibrillation   Chronic anticoagulation   Diastolic CHF, chronic   Hypotension   1. UTI and hypotension borderline for sepsis - keeping patient on rocephin, was slightly hypotensive in ED but responsive to fluids at this time, admitting patient to stepdown.  Hold home lasix, fluids TKO and using boluses given that patient already has JVD.  Cultures pending. 2. Chronic anticoagulation - for A.Fib and mechanical heart valve, continue coumadin 3. Chronic CHF - not in exacerbation now, but as explained to family may push this into exacerbation after fluid resuscitation. 4. A.Fib - rate controlled, continue home toprol.    Code Status: Full Code (must indicate code status--if unknown or must be presumed, indicate so) Family Communication: Spoke with family at bedside (indicate person spoken with, if applicable, with phone number if by telephone) Disposition Plan: Admit to inpatient (indicate anticipated LOS)  Time spent: 70 min  Navil Kole M. [facility] Pager [phone]  If 7PM-7AM, please contact night-coverage [email or web address] Password [facility] 12/12/2012, 8:27 PM

## 2012-12-12 NOTE — Progress Notes (Signed)
ANTICOAGULATION CONSULT NOTE - Initial Consult  Pharmacy Consult for Coumadin Indication: Mechanical valve , CAF   Allergies  Allergen Reactions  . Promethazine Hcl     REACTION: made her real shaky  . Promethazine Hcl     Patient Measurements: Height: 4\' 11"  (149.9 cm) Weight: 159 lb (72.122 kg) IBW/kg (Calculated) : 43.2 Heparin Dosing Weight:    Vital Signs: Temp: 98.8 F (37.1 C) (04/18 1945) Temp src: Oral (04/18 1945) BP: 104/59 mmHg (04/18 2030) Pulse Rate: 86 (04/18 2030)  Labs:  Recent Labs  12/11/12 0858 12/11/12 0903 12/12/12 1546 12/12/12 1634  HGB  --   --  12.1  --   HCT  --   --  35.9*  --   PLT  --   --  198  --   LABPROT  --   --   --  25.4*  INR 3.7  --   --  2.44*  CREATININE  --  0.9 1.09  --     Estimated Creatinine Clearance: 47.5 ml/min (by C-G formula based on Cr of 1.09).   Medical History: Past Medical History  Diagnosis Date  . Rheumatic heart disease     Status post mechanical (St. Jude) mitral valve replacement and tricuspid valve repair at [facility] in 2002; echo 5/12:   EF 60-65%, mild AI, mitral valve prosthesis with AVA 1.66, severe LAE, moderate RAE, moderate to severe TR, mild increased pulmonary artery systolic pressure;    Adenosine Cardiolite in 4/09:   No ischemia, EF 66%  . Chronic atrial fibrillation   . Chronic anticoagulation   . Fatigue   . Borderline diabetes   . History of mitral valve replacement 2002    St. Jude  . Hyperlipidemia   . Obesity   . Anemia   . Palpitations   . Headache   . Gout   . Hypertension   . Carpal tunnel syndrome   . External hemorrhoids   . Rotator cuff syndrome   . 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 CHF (congestive heart failure)     nl EF    Medications:  Warfarin 2mg  daily except 1mg  on Mondays.  Assessment: SOB and recent problems with allergies (on Claritin and Prednisone). Found to have fever with UTI.   62 y/o F with rheumatic  heart disease and MV replacement/TV repair 2002, chronic atrial fibrillation, and chronic diastolic CHF (EF 16-10%).  Anticoagulation: INR 2.44 slightly < goal for MVR/AVR. Continue anticoagulation per pharmacy  ID:  Patient is currently afebrile but WBC elevated at 12.7. Na low at 132. BP low 85/59 responsive to fluids. Scr 1.09 with estimated CrCl 47. Lactic acid 1.76. Rocephin 1g IV x 1 given in ED for UTI.   Goal of Therapy:  INR 2.5-3.5 Monitor platelets by anticoagulation protocol: Yes   Plan:  Give Coumadin 2mg  po x 1 tonight to increase INR. Daily PT/INR  Misty Stanley Stillinger 12/12/2012,8:51 PM

## 2012-12-12 NOTE — Telephone Encounter (Signed)
New Problem:    Called in because her mother has a high fever of 102, aches all over and is experiencing SOB and may be accumulating more fluid around her heart.  Please call back.

## 2012-12-12 NOTE — Telephone Encounter (Signed)
**Note De-Identified Sherline Eberwein Obfuscation** Pts daughter is advised to call 911 or, if she can do so safely, to drive her mother to Urgent care or the ER, she verbalized understanding.

## 2012-12-12 NOTE — Consult Note (Signed)
History and Physical   Patient ID: Deborah [name] MRN: [id number], DOB/AGE: 05-17-1951   Admit date: 12/12/2012 Date of Consult: 12/12/2012   Primary Physician: Julieanne Manson, MD Primary Cardiologist: Deborah Circle, MD  HPI: Deborah [name] is a 62 y.o. female with PMHx of rheumatic heart disease s/p MVR (St. Jude)/TVR at [facility] in 2002 (see below), chronic diastolic CHF, permanent atrial fibrillation, HLD, HTN, anemia and obesity who presents to the ED with chest pressure.   She was seen by Dr. [name] yesterday for follow-up. She was noted to be down 3 lbs since her prior follow-up in 02/2012. She reported having trouble with congestion and allergies requiring trips to urgent care.  Recent short courses of prednisone for widespread rash. No URI sxs.  She has developed NYHA class III-IV symptoms (dyspnea walking 100 feet, previously 30-45 minutes/day without problems). No chest discomfort at that time. Suspected to be volume overloaded. The plan was made to increase Lasix from 60 to 80 mg daily x 5 days. Advised to watch sodium intake closely. pBNP 96 yesterday. BMET WNL.    She reports experiencing increased shortness of breath and chest pressure since that time. She reports new nonproductive cough. No radiation or association with exertion. She also notes fever of 102 and dysuria yesterday. She does note chills, possibly rigors. She completed her last course of steroids 2 weeks ago. She developed a diffuse rash at that time believed to be medication related, which has since resolved. No unilateral leg swelling, pain or tenderness.   In the ED, EKG reveals atrial fibrillation, rate-controlled, no ST/T changes, IVCD II, aVF. BMET- Na 132, Cr 1.09 (0.9 yesterday). pBNP 1611. CBC- WBC 12.7, Hgb 12.1, Hct 35.9, MCV 70.3, PLT 198. INR 2.44 (goal 2.5-3.5). CXR chronic left basilar scarring, stable cardiomegaly, no infiltrates or effusions. U/a + leuk est. Urine culture pending.   Problem List: Past  Medical History  Diagnosis Date  . Rheumatic heart disease     Status post mechanical mitral valve replacement and tricuspid valve repair at [facility] in 2002; echo 5/12:   EF 60-65%, mild AI, mitral valve prosthesis with AVA 1.66, severe LAE, moderate RAE, moderate to severe TR, mild increased pulmonary artery systolic pressure;    Adenosine Cardiolite in 4/09:   No ischemia, EF 66%  . Chronic atrial fibrillation   . Chronic anticoagulation   . Fatigue   . Borderline diabetes   . History of mitral valve replacement   . Hyperlipidemia   . Obesity   . Anemia   . Palpitations   . Headache   . Gout   . Hypertension   . Carpal tunnel syndrome   . External hemorrhoids   . Rotator cuff syndrome   . 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 CHF (congestive heart failure)     Past Surgical History  Procedure Laterality Date  . Tricuspid valve repair    . Cardiac catheterization      EF 55%    Allergies:  Allergies  Allergen Reactions  . Promethazine Hcl     REACTION: made her real shaky  . Promethazine Hcl    Home Medications: Prior to Admission medications   Medication Sig Start Date End Date Taking? Authorizing Provider  acetaminophen (TYLENOL) 325 MG tablet Take 650 mg by mouth every 6 (six) hours as needed for pain.    Yes Historical Provider, MD  allopurinol (ZYLOPRIM) 300 MG tablet Take 300 mg by mouth daily.  Yes Historical Provider, MD  aspirin EC 81 MG tablet Take 81 mg by mouth daily.   Yes Historical Provider, MD  benzonatate (TESSALON) 100 MG capsule Take 100 mg by mouth 3 (three) times daily as needed for cough. 12/01/12  Yes Calvert Cantor, MD  chlorpheniramine-HYDROcodone (TUSSIONEX) 10-8 MG/5ML LQCR Take 5 mLs by mouth every 12 (twelve) hours as needed (cough). 12/01/12  Yes Calvert Cantor, MD  folic acid (FOLVITE) 1 MG tablet Take 1 mg by mouth daily.    Yes Historical Provider, MD  furosemide (LASIX) 80 MG tablet Take 80 mg by mouth 2  (two) times daily.   Yes Historical Provider, MD  hydrOXYzine (VISTARIL) 100 MG capsule Take 100 mg by mouth 3 (three) times daily as needed for itching. 12/01/12  Yes Calvert Cantor, MD  loratadine (CLARITIN) 10 MG tablet Take 10 mg by mouth daily.   Yes Historical Provider, MD  metoprolol succinate (TOPROL-XL) 50 MG 24 hr tablet Take 50 mg by mouth daily. Take with or immediately following a meal.   Yes Historical Provider, MD  omeprazole (PRILOSEC) 20 MG capsule Take 20 mg by mouth daily before breakfast.   Yes Historical Provider, MD  potassium chloride SA (K-DUR,KLOR-CON) 20 MEQ tablet Take 20-40 mEq by mouth 2 (two) times daily. 2 tabs in the am, 1 tab in the pm   Yes Historical Provider, MD  rosuvastatin (CRESTOR) 5 MG tablet Take 5 mg by mouth every other day.    Yes Historical Provider, MD  warfarin (COUMADIN) 2 MG tablet Take 1-2 mg by mouth every evening. Monday only-0.5 (1 mg total) half tab; All other days 1 tab (2 mg total)   Yes Historical Provider, MD  I Family History  Problem Relation Age of Onset  . Stroke Mother   . Diabetes Mother   . Heart failure Mother   . Heart disease Father     "heart problems"  . Heart disease Sister   . Heart disease Brother   . Gout Brother   . Diabetes Brother   . Stroke Brother   . Valvular heart disease Sister   .        History   Social History  . Marital Status: Married    Spouse Name: N/A    Number of Children: N/A  . Years of Education: N/A   Occupational History  . Not on file.   Social History Main Topics  . Smoking status: Never Smoker   . Smokeless tobacco: Never Used  . Alcohol Use: No  . Drug Use: No  . Sexually Active: Not on file   Other Topics Concern  . Not on file   Social History Narrative  . No narrative on file    Review of Systems: General: positive for chills, fever, negative for night sweats or weight changes.  Cardiovascular: positive for chest pain, dyspnea on exertion/shortness of breath, edema,  negative for orthopnea, palpitations, paroxysmal nocturnal dyspnea Dermatological: negative for rash Respiratory: negative for cough or wheezing Urologic: positive for dysuria, negative for hematuria Abdominal: negative for nausea, vomiting, diarrhea, bright red blood per rectum, melena, or hematemesis Neurologic:  negative for visual changes, syncope, or dizziness All other systems reviewed and are otherwise negative except as noted above.  Physical Exam: Blood pressure 105/55, pulse 84, temperature 97.8 F (36.6 C), temperature source Oral, resp. rate 19, height 4\' 11"  (1.499 m), weight 72.122 kg (159 lb), SpO2 100.00%.    General: Well developed, well nourished, in mild acute distress-soft  voice, weak, generalized malaise. Head: Normocephalic, atraumatic, sclera non-icteric, no xanthomas, nares are without discharge.  Neck: Negative for carotid bruits. Moderate JVD. Lungs: Clear bilaterally to auscultation without wheezes, rales, or rhonchi. Breathing is unlabored; mildly tachypneic Heart: Irregularly irregular, prosthetic, mechanical S1 S2. Midsystolic murmur. No murmurs, rubs, or gallops appreciated. Abdomen:  Soft, non-tender, non-distended with normoactive bowel sounds. No hepatomegaly. No rebound/guarding. No obvious abdominal masses. Msk:  Strength and tone appears normal for age. Extremities: No clubbing, cyanosis or edema.  Distal pedal pulses are 2+ and equal bilaterally. Neuro: Alert and oriented X 3. Moves all extremities spontaneously. Psych:  Responds to questions appropriately with a normal affect.  Labs: Recent Labs     12/12/12  1546  WBC  12.7*  HGB  12.1  HCT  35.9*  MCV  70.3*  PLT  198   Recent Labs Lab 12/11/12 0903 12/12/12 1546  NA 139 132*  K 4.1 3.8  CL 103 97  CO2 28 25  BUN 21 18  CREATININE 0.9 1.09  CALCIUM 8.8 9.1  GLUCOSE 114* 148*   Recent Labs     12/11/12  0903  CHOL  205*  HDL  51.30  TRIG  225.0*  CHOLHDL  4  LDLDIRECT  104.2    Radiology/Studies: Dg Chest 2 View  12/12/2012  *RADIOLOGY REPORT*  Clinical Data: Chest pain.  CHEST - 2 VIEW  Comparison: 10/02/2011.  Findings: The heart is enlarged but stable.  There are surgical changes from bypass surgery.  The lungs are clear except for chronic left basilar scarring and mild pleural thickening.  No edema or pneumothorax.  The bony thorax is intact.  IMPRESSION: Chronic left basilar scarring changes. Stable cardiac enlargement. No infiltrates or effusions.   Original Report Authenticated By: Rudie Meyer, M.D.    Dg Hand Complete Left  EKG: atrial fibrillation, 83 bpm, otherwise WNL  ASSESSMENT AND PLAN:   62 y.o. female with PMHx s/f rheumatic heart disease s/p MVR/TVR at [facility] in 2002 (see below), chronic diastolic CHF, permanent atrial fibrillation, HLD, HTN, anemia and obesity who presents to the ED with chest pressure.   1. Acute on chronic CHF 2. UTI 3. Rheumatic heart failure s/p MVR/TVR  4. Chronic Coumadin anticoagulation 5. Permanent atrial fibrillation 6. Hyperlipidemia 7. Hypertension 8. History of anemia with microcytosis on CBC today 9. Hypotension  The patient presents with new UTI likely driving her CHF decompensation. The patient is hypotensive in the ED (SBP 90s) and fairly euvolemic (moderately elevated JVD). She has evidence of moderate-severe TR on prior 2012 echo as well. She is receiving an IVF bolus per the primary team. SBP improved to 110s. She will be started on empiric IV antibiotics. Coumadin per pharmacy consult (INR goal 2.5-3.5). INR dropped 3.7 to 2.44 today. Would check LFTs. Atrial fibrillation is rate-controlled. Will re-address fluid and hemodynamic status tomorrow morning after antibiotics circulate.   Deborah Bath, PA-C 12/12/2012, 7:38 PM   Cardiology Attending Patient interviewed and examined. Discussed with R. Hurman Horn, PA-C.  Above note annotated and modified based upon my findings.  Chest discomfort w/o h/o CAD,  but with multiple CV risk factors.  Since no CABG performed in 2002, I assume there was no significant ASCVD at that time.  No acute EKG abnormalities.  Doubt ACS-obtain serial markers and EKGs.  Dyspnea also non-specific.  No acute CXR abnormalities.  No hypoxemia.  Rx symptomatically for now.  Pulmonary embolism a consideration, but unlikely in the face of chronic  effective anticoagulation.  Fever and leukocytosis in setting of prior MVR raises question of bacterial endocarditis.  Onset sounds acute.  No increase in mild chronic anemia.  Blood cultures pending.  Antibiotic rx initiated for presumed UTI.  U/A findings not impressive, but pt symptomatic with prior UTIs.  Agree with empiric therapy pending cultures.  Rapid increase in BNP level over 24 hrs despite increased dose of furosemide.  +JVD, but this may be chronic due to persistent TR.  In the face of borderline hypertension and with clear lungs by exam and radiographically, hold on diuretics for now.  We will follow.  [place], MD 12/12/2012, 8:31 PM

## 2012-12-13 ENCOUNTER — Inpatient Hospital Stay (HOSPITAL_COMMUNITY): Payer: Medicaid Other

## 2012-12-13 ENCOUNTER — Encounter (HOSPITAL_COMMUNITY): Payer: Self-pay | Admitting: Cardiology

## 2012-12-13 DIAGNOSIS — I959 Hypotension, unspecified: Secondary | ICD-10-CM

## 2012-12-13 DIAGNOSIS — I059 Rheumatic mitral valve disease, unspecified: Secondary | ICD-10-CM

## 2012-12-13 DIAGNOSIS — A419 Sepsis, unspecified organism: Secondary | ICD-10-CM

## 2012-12-13 DIAGNOSIS — I4891 Unspecified atrial fibrillation: Secondary | ICD-10-CM

## 2012-12-13 DIAGNOSIS — I5032 Chronic diastolic (congestive) heart failure: Secondary | ICD-10-CM

## 2012-12-13 DIAGNOSIS — M109 Gout, unspecified: Secondary | ICD-10-CM

## 2012-12-13 DIAGNOSIS — R6510 Systemic inflammatory response syndrome (SIRS) of non-infectious origin without acute organ dysfunction: Secondary | ICD-10-CM | POA: Diagnosis present

## 2012-12-13 DIAGNOSIS — E119 Type 2 diabetes mellitus without complications: Secondary | ICD-10-CM

## 2012-12-13 LAB — POCT I-STAT, CHEM 8
Calcium, Ion: 1.07 mmol/L — ABNORMAL LOW (ref 1.13–1.30)
Glucose, Bld: 159 mg/dL — ABNORMAL HIGH (ref 70–99)
HCT: 42 % (ref 36.0–46.0)
Hemoglobin: 14.3 g/dL (ref 12.0–15.0)
Potassium: 3.3 mEq/L — ABNORMAL LOW (ref 3.5–5.1)
TCO2: 21 mmol/L (ref 0–100)

## 2012-12-13 LAB — CBC
MCH: 23.3 pg — ABNORMAL LOW (ref 26.0–34.0)
MCHC: 33 g/dL (ref 30.0–36.0)
Platelets: 148 10*3/uL — ABNORMAL LOW (ref 150–400)
RDW: 15.5 % (ref 11.5–15.5)

## 2012-12-13 LAB — PRO B NATRIURETIC PEPTIDE: Pro B Natriuretic peptide (BNP): 1094 pg/mL — ABNORMAL HIGH (ref 0–125)

## 2012-12-13 LAB — BASIC METABOLIC PANEL
Calcium: 7.3 mg/dL — ABNORMAL LOW (ref 8.4–10.5)
GFR calc non Af Amer: 65 mL/min — ABNORMAL LOW (ref >90)
Glucose, Bld: 163 mg/dL — ABNORMAL HIGH (ref 70–99)
Sodium: 137 mEq/L (ref 135–145)

## 2012-12-13 LAB — LACTIC ACID, PLASMA: Lactic Acid, Venous: 1 mmol/L (ref 0.5–2.2)

## 2012-12-13 LAB — MRSA PCR SCREENING: MRSA by PCR: NEGATIVE

## 2012-12-13 LAB — TROPONIN I
Troponin I: <0.3 ng/mL (ref <0.30)
Troponin I: <0.3 ng/mL (ref <0.30)

## 2012-12-13 MED ORDER — VANCOMYCIN HCL 10 G IV SOLR
1250.0000 mg | INTRAVENOUS | Status: DC
  Filled 2012-12-13: qty 1250

## 2012-12-13 MED ORDER — DEXTROSE 5 % IV SOLN
500.0000 mg | INTRAVENOUS | Status: DC
  Administered 2012-12-13 – 2012-12-15 (×3): 500 mg via INTRAVENOUS
  Filled 2012-12-13 (×4): qty 500

## 2012-12-13 MED ORDER — PIPERACILLIN-TAZOBACTAM 3.375 G IVPB: 3.3750 g | Freq: Four times a day (QID) | INTRAVENOUS | Status: DC

## 2012-12-13 MED ORDER — POTASSIUM CHLORIDE CRYS ER 20 MEQ PO TBCR
40.0000 meq | EXTENDED_RELEASE_TABLET | Freq: Two times a day (BID) | ORAL | Status: AC
  Administered 2012-12-13 (×2): 40 meq via ORAL
  Filled 2012-12-13 (×2): qty 2

## 2012-12-13 MED ORDER — SODIUM CHLORIDE 0.9 % IV BOLUS (SEPSIS)
1000.0000 mL | Freq: Once | INTRAVENOUS | Status: AC
  Administered 2012-12-13: 1000 mL via INTRAVENOUS

## 2012-12-13 MED ORDER — VANCOMYCIN HCL IN DEXTROSE 1-5 GM/200ML-% IV SOLN
1000.0000 mg | Freq: Once | INTRAVENOUS | Status: AC
  Administered 2012-12-13: 1000 mg via INTRAVENOUS
  Filled 2012-12-13: qty 200

## 2012-12-13 MED ORDER — PIPERACILLIN-TAZOBACTAM 3.375 G IVPB
3.3750 g | Freq: Three times a day (TID) | INTRAVENOUS | Status: DC
  Administered 2012-12-13: 3.375 g via INTRAVENOUS
  Filled 2012-12-13 (×3): qty 50

## 2012-12-13 MED ORDER — METHYLPREDNISOLONE SODIUM SUCC 125 MG IJ SOLR
60.0000 mg | Freq: Two times a day (BID) | INTRAMUSCULAR | Status: DC
  Administered 2012-12-13 – 2012-12-14 (×4): 60 mg via INTRAVENOUS
  Filled 2012-12-13 (×5): qty 0.96
  Filled 2012-12-13: qty 2
  Filled 2012-12-13: qty 0.96
  Filled 2012-12-13: qty 2
  Filled 2012-12-13: qty 0.96

## 2012-12-13 MED ORDER — SODIUM CHLORIDE 0.9 % IV BOLUS (SEPSIS)
500.0000 mL | Freq: Once | INTRAVENOUS | Status: AC
  Administered 2012-12-13: 500 mL via INTRAVENOUS

## 2012-12-13 MED ORDER — WARFARIN SODIUM 2 MG PO TABS
2.0000 mg | ORAL_TABLET | Freq: Once | ORAL | Status: AC
  Administered 2012-12-13: 2 mg via ORAL
  Filled 2012-12-13: qty 1

## 2012-12-13 MED ORDER — DEXTROSE 5 % IV SOLN
1.0000 g | INTRAVENOUS | Status: DC
  Administered 2012-12-13 – 2012-12-15 (×3): 1 g via INTRAVENOUS
  Filled 2012-12-13 (×4): qty 10

## 2012-12-13 MED ORDER — ONDANSETRON HCL 4 MG/2ML IJ SOLN
4.0000 mg | Freq: Four times a day (QID) | INTRAMUSCULAR | Status: DC | PRN
  Administered 2012-12-13: 4 mg via INTRAVENOUS
  Filled 2012-12-13: qty 2

## 2012-12-13 MED ORDER — HYDROCODONE-ACETAMINOPHEN 5-325 MG PO TABS
1.0000 | ORAL_TABLET | ORAL | Status: DC | PRN
  Administered 2012-12-13: 1 via ORAL
  Filled 2012-12-13: qty 1

## 2012-12-13 NOTE — Progress Notes (Signed)
B/p dropped .  Called and notified dr. Julian Reil .  Orders for 500cc bolus.  Canceling dilaudid.

## 2012-12-13 NOTE — Progress Notes (Signed)
Patient ID: Edwina Barth, female   DOB: 08-04-1951, 62 y.o.   MRN: [id number]    SUBJECTIVE: Feels "sick" this morning.  Chest heavy and tingling in hands.  Hypotensive overnight, got IV fluid now SBP in 110s.   Marland Kitchen allopurinol  300 mg Oral Daily  . aspirin EC  81 mg Oral Daily  . atorvastatin  10 mg Oral q1800  . cefTRIAXone (ROCEPHIN)  IV  1 g Intravenous Q24H  . folic acid  1 mg Oral Daily  . HYDROmorphone      . loratadine  10 mg Oral Daily  . pantoprazole  40 mg Oral Daily  . potassium chloride  40 mEq Oral BID  . sodium chloride  3 mL Intravenous Q12H  . warfarin  2 mg Oral ONCE-1800  . Warfarin - Pharmacist Dosing Inpatient   Does not apply q1800      Filed Vitals:   12/13/12 0604 12/13/12 0615 12/13/12 0630 12/13/12 0800  BP: 105/48 95/51 118/66   Pulse: 78 64 72   Temp:    98.5 F (36.9 C)  TempSrc:    Oral  Resp: 17 20 22    Height:      Weight:      SpO2: 100% 99% 99%     Intake/Output Summary (Last 24 hours) at 12/13/12 0834 Last data filed at 12/13/12 0600  Gross per 24 hour  Intake   2763 ml  Output    300 ml  Net   2463 ml    LABS: Basic Metabolic Panel:  Recent Labs  40/98/11 1546 12/13/12 0555 12/13/12 0828  NA 132* 137 142  K 3.8 3.6 3.3*  CL 97 106 108  CO2 25 23  --   GLUCOSE 148* 163* 159*  BUN 18 15 13   CREATININE 1.09 0.94 0.80  CALCIUM 9.1 7.3*  --    Liver Function Tests: No results found for this basename: AST, ALT, ALKPHOS, BILITOT, PROT, ALBUMIN,  in the last 72 hours No results found for this basename: LIPASE, AMYLASE,  in the last 72 hours CBC:  Recent Labs  12/12/12 1546 12/13/12 0555 12/13/12 0828  WBC 12.7* 8.0  --   NEUTROABS 10.6*  --   --   HGB 12.1 9.7* 14.3  HCT 35.9* 29.4* 42.0  MCV 70.3* 70.5*  --   PLT 198 148*  --    Cardiac Enzymes:  Recent Labs  12/13/12 0025  TROPONINI <0.30   BNP: No components found with this basename: POCBNP,  D-Dimer: No results found for this basename: DDIMER,  in  the last 72 hours Hemoglobin A1C: No results found for this basename: HGBA1C,  in the last 72 hours Fasting Lipid Panel:  Recent Labs  12/11/12 0903  CHOL 205*  HDL 51.30  TRIG 225.0*  CHOLHDL 4  LDLDIRECT 104.2   Thyroid Function Tests: No results found for this basename: TSH, T4TOTAL, FREET3, T3FREE, THYROIDAB,  in the last 72 hours Anemia Panel: No results found for this basename: VITAMINB12, FOLATE, FERRITIN, TIBC, IRON, RETICCTPCT,  in the last 72 hours  RADIOLOGY: Dg Chest 2 View  12/12/2012  *RADIOLOGY REPORT*  Clinical Data: Chest pain.  CHEST - 2 VIEW  Comparison: 10/02/2011.  Findings: The heart is enlarged but stable.  There are surgical changes from bypass surgery.  The lungs are clear except for chronic left basilar scarring and mild pleural thickening.  No edema or pneumothorax.  The bony thorax is intact.  IMPRESSION: Chronic left basilar scarring changes. Stable  cardiac enlargement. No infiltrates or effusions.   Original Report Authenticated By: Rudie Meyer, M.D.    Dg Chest Port 1 View  12/13/2012  *RADIOLOGY REPORT*  Clinical Data: Dizziness when supine; shortness of breath. Evaluate for pulmonary edema.  PORTABLE CHEST - 1 VIEW  Comparison: Chest radiograph performed 12/12/2012  Findings: The lungs are relatively well expanded.  Mildly worsening left basilar airspace opacification raises concern for pneumonia, though mild asymmetric interstitial edema might have a similar appearance.  A small left pleural effusion is seen.  This appears superimposed on the patient's chronic lung changes.  No pneumothorax is seen.  The cardiomediastinal silhouette is mildly enlarged.  The patient is status post median sternotomy.  No acute osseous abnormalities are seen.  IMPRESSION:  1.  Mildly worsening left basilar airspace opacification raises concern for pneumonia, though mild asymmetric interstitial edema might have a similar appearance.  Small left pleural effusion seen. Findings  appear superimposed on the patient's chronic lung changes. 2.  Mild cardiomegaly.   Original Report Authenticated By: Tonia Ghent, M.D.    Dg Hand Complete Left  11/17/2012  *RADIOLOGY REPORT*  Clinical Data: Left hand pain, most prominent along the left fifth digit.  No known injury.  LEFT HAND - COMPLETE 3+ VIEW  Comparison: Right hand radiographs 11/17/2012  Findings: Normal bony mineralization and alignment.  There is joint space loss and osteophyte formation with prominent dorsal spurring spurring at the distal interphalangeal joint of the fifth digit. There is slight soft tissue prominence about the DIP joint of the fifth digit.  Mild / early degenerative changes are seen in the distal interphalangeal joints of the second through fourth digits. No bony erosions or fracture is identified.  IMPRESSION:  1.  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 2.  Very early degenerative changes of the distal interphalangeal joints of the second through fourth fingers.   Original Report Authenticated By: Britta Mccreedy, M.D.    Dg Hand Complete Right  11/17/2012  *RADIOLOGY REPORT*  Clinical Data: Right hand pain for 2-3 months.  RIGHT HAND - COMPLETE 3+ VIEW  Comparison: None.  Findings: Three views of the right hand were obtained.  There is normal alignment.  No evidence for acute fracture or dislocation. No significant soft tissue swelling.  No significant joint space narrowing or degenerative changes.  IMPRESSION: No acute bony abnormality.   Original Report Authenticated By: Richarda Overlie, M.D.     PHYSICAL EXAM General: NAD Neck: JVP 8-9 cm, no thyromegaly or thyroid nodule.  Lungs: Slight crackles at bases bilaterally.  CV: Nondisplaced PMI.  Heart regular S1/S2, no S3/S4, no murmur.  No peripheral edema.  No carotid bruit.  Normal pedal pulses.  Abdomen: Soft, nontender, no hepatosplenomegaly, no distention.  Neurologic: Alert and oriented x 3.  Psych: Normal  affect. Extremities: No clubbing or cyanosis.   TELEMETRY: Reviewed telemetry pt in NSR  ASSESSMENT AND PLAN: 62 yo with history of mechanical MV replacement/tricuspid valve repair with moderate to severe TR on last echo and diastolic CHF.  Also with chronic atrial fibrillation.  She presented with suspected urosepsis/hypotension.  1. Urosepsis: BP improved this morning.  Would hold off on further IV fluid if possible.  Continue abx, awaiting culture/sensitivities.  2. Mechanical MVR: Coumadin with INR goal 2.5-3.5, ASA 81.  3. Diastolic CHF: JVP mildly elevated but also has chronic significant TR.  Would avoid further IV fluids if possible.  4. Chronic atrial fibrillation: Stable rate. When BP stable  will need to restart Toprol XL.   Marca Ancona 12/13/2012 8:38 AM

## 2012-12-13 NOTE — Progress Notes (Addendum)
B/p down .  sys 70's .  Called dr. Julian Reil.  States he is coming to see pt.  Pt symptomatic.  Feels clammy, states she is light headed.

## 2012-12-13 NOTE — Progress Notes (Signed)
Ccm consulted .  To give pt another liter of iv fluid.

## 2012-12-13 NOTE — Progress Notes (Signed)
Pt c/o left hand pain and also some neck pain.  States pain worse with movement.  ekg done .  Called dr. Julian Reil.  Serial troponins ordered.  Also orders for pain and colchicine.

## 2012-12-13 NOTE — Consult Note (Signed)
PULMONARY  / CRITICAL CARE MEDICINE  Name: Deborah [name] MRN: [id number] DOB: 1951/02/08    ADMISSION DATE:  12/12/2012 CONSULTATION DATE:  12/14/02  REFERRING MD :  Dr. Lyda Perone PRIMARY SERVICE: [facility]  CHIEF COMPLAINT:  Hypotension  BRIEF PATIENT DESCRIPTION: 62 yo F with rheumatic heart disease s/p MVR and afib admitted with fever and hypotension likely 2/2 urosepsis.  SIGNIFICANT EVENTS / STUDIES:  4/18 Admitted 4/19 Hypotension  LINES / TUBES: A-line 4/19>>>   CULTURES: Urine Cx 4/18 >>> Blood Cx 4/18 >>>  ANTIBIOTICS: Vanc 4/19 >>> Zosyn 4/19 >>>  HISTORY OF PRESENT ILLNESS:  62 yp F with rheumatic heart disease s/p MVR (St. Jude)/TVR, chronic diastolic CHF,  atrial fibrillation, HLD, and HTN.  She came to the ED with fever to 102, chills, fatigue, and dysuria.  She has also had some mild chest pressure and cough.  She was admitted and noted to be mildly hypotensive with response to IVF.  Overnight had additional episodes of hypotension and currently getting 4th liter NS since admission.   PAST MEDICAL HISTORY :  Past Medical History  Diagnosis Date  . Rheumatic heart disease     Status post mechanical (St. Jude) mitral valve replacement and tricuspid valve repair at [facility] in 2002; echo 5/12:   EF 60-65%, mild AI, mitral valve prosthesis with AVA 1.66, severe LAE, moderate RAE, moderate to severe TR, mild increased pulmonary artery systolic pressure;    Adenosine Cardiolite in 4/09:   No ischemia, EF 66%  . Chronic atrial fibrillation   . Chronic anticoagulation   . Fatigue   . Borderline diabetes   . History of mitral valve replacement 2002    St. Jude  . Hyperlipidemia   . Obesity   . Anemia   . Palpitations   . Headache   . Gout   . Hypertension   . Carpal tunnel syndrome   . External hemorrhoids   . Rotator cuff syndrome   . 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 CHF (congestive heart failure)      nl EF   Past Surgical History  Procedure Laterality Date  . Tricuspid valve repair    . Cardiac catheterization      EF 55%   Prior to Admission medications   Medication Sig Start Date End Date Taking? Authorizing Provider  acetaminophen (TYLENOL) 325 MG tablet Take 650 mg by mouth every 6 (six) hours as needed for pain.    Yes Historical Provider, MD  allopurinol (ZYLOPRIM) 300 MG tablet Take 300 mg by mouth daily.    Yes Historical Provider, MD  aspirin EC 81 MG tablet Take 81 mg by mouth daily.   Yes Historical Provider, MD  benzonatate (TESSALON) 100 MG capsule Take 100 mg by mouth 3 (three) times daily as needed for cough. 12/01/12  Yes Calvert Cantor, MD  chlorpheniramine-HYDROcodone (TUSSIONEX) 10-8 MG/5ML LQCR Take 5 mLs by mouth every 12 (twelve) hours as needed (cough). 12/01/12  Yes Calvert Cantor, MD  folic acid (FOLVITE) 1 MG tablet Take 1 mg by mouth daily.    Yes Historical Provider, MD  furosemide (LASIX) 80 MG tablet Take 80 mg by mouth 2 (two) times daily.   Yes Historical Provider, MD  hydrOXYzine (VISTARIL) 100 MG capsule Take 100 mg by mouth 3 (three) times daily as needed for itching. 12/01/12  Yes Calvert Cantor, MD  loratadine (CLARITIN) 10 MG tablet Take 10 mg by mouth daily.  Yes Historical Provider, MD  metoprolol succinate (TOPROL-XL) 50 MG 24 hr tablet Take 50 mg by mouth daily. Take with or immediately following a meal.   Yes Historical Provider, MD  omeprazole (PRILOSEC) 20 MG capsule Take 20 mg by mouth daily before breakfast.   Yes Historical Provider, MD  potassium chloride SA (K-DUR,KLOR-CON) 20 MEQ tablet Take 20-40 mEq by mouth 2 (two) times daily. 2 tabs in the am, 1 tab in the pm   Yes Historical Provider, MD  rosuvastatin (CRESTOR) 5 MG tablet Take 5 mg by mouth every other day.    Yes Historical Provider, MD  warfarin (COUMADIN) 2 MG tablet Take 1-2 mg by mouth every evening. Monday only-0.5 (1 mg total) half tab; All other days 1 tab (2 mg total)   Yes  Historical Provider, MD   Allergies  Allergen Reactions  . Promethazine Hcl     REACTION: made her real shaky  . Promethazine Hcl     FAMILY HISTORY:  Family History  Problem Relation Age of Onset  . Stroke Mother   . Diabetes Mother   . Heart failure Mother   . Heart disease Father     "heart problems"  . Heart disease Sister   . Heart disease Brother   . Gout Brother   . Diabetes Brother   . Stroke Brother   . Valvular heart disease Sister   .      SOCIAL HISTORY:  reports that she has never smoked. She has never used smokeless tobacco. She reports that she does not drink alcohol or use illicit drugs.  REVIEW OF SYSTEMS:  Review of 10 systems negative except as listed in HPI  SUBJECTIVE:   VITAL SIGNS: Temp:  [97.8 F (36.6 C)-100.2 F (37.9 C)] 98.3 F (36.8 C) (04/19 0600) Pulse Rate:  [58-124] 78 (04/19 0604) Resp:  [14-25] 17 (04/19 0604) BP: (77-140)/(34-85) 105/48 mmHg (04/19 0604) SpO2:  [96 %-100 %] 100 % (04/19 0604) Weight:  [72.122 kg (159 lb)] 72.122 kg (159 lb) (04/18 1528) HEMODYNAMICS:   VENTILATOR SETTINGS:   INTAKE / OUTPUT: Intake/Output     04/18 0701 - 04/19 0700   I.V. (mL/kg) 1513 (21)   IV Piggyback 1250   Total Intake(mL/kg) 2763 (38.3)   Urine (mL/kg/hr) 300   Total Output 300   Net +2463         PHYSICAL EXAMINATION: General:  appears tired but NAD Neuro:  Awake, alert, HEENT:  Sclera clear, MMM, EOMI Cardiovascular:  Irregular irregular, mechanical heart sounds Lungs:  CTAB Abdomen:  Soft, NT, ND Musculoskeletal:  Joints wnl Skin:  Warm, no rash  LABS:  Recent Labs Lab 12/11/12 0858 12/11/12 0903 12/12/12 1546 12/12/12 1634 12/12/12 1703 12/13/12 0025  HGB  --   --  12.1  --   --   --   WBC  --   --  12.7*  --   --   --   PLT  --   --  198  --   --   --   NA  --  139 132*  --   --   --   K  --  4.1 3.8  --   --   --   CL  --  103 97  --   --   --   CO2  --  28 25  --   --   --   GLUCOSE  --  114* 148*   --   --   --  BUN  --  21 18  --   --   --   CREATININE  --  0.9 1.09  --   --   --   CALCIUM  --  8.8 9.1  --   --   --   INR 3.7  --   --  2.44*  --   --   LATICACIDVEN  --   --   --   --  1.76  --   TROPONINI  --   --   --   --   --  <0.30  PROBNP  --  96.0 1611.0*  --   --   --    No results found for this basename: GLUCAP,  in the last 168 hours  CXR: Chronic left basilar scarring  ASSESSMENT / PLAN:  PULMONARY  A: No acute issues, watch for pulmonary edema in setting fluid resescutation P:  - Wean O2 - Check ABG after aline placement   CARDIOVASCULAR A: Hypotension - likely 2/2 urosepsis with underlying heart disease P:  - Appears to be perfusing well with low lactate, UOP and good mentation - Place a-line to confirm accuracy of BP - Getting 4th liter NS now - If persistent hypotension will need to consider central line for CVP and pressors - Hold metoprolol given hypotension - Cardiology following    HEMATOLOGIC A:  Anticoagulation in setting of MVR P:  - Continue coumadin  INFECTIOUS A:  Sepsis likely urosepsis P:   -Vanc Zosyn - F/u urine and blood cultures   I have personally obtained a history, examined the patient, evaluated laboratory and imaging results, formulated the assessment and plan and placed orders.  CRITICAL CARE: The patient is critically ill with multiple organ systems failure and requires high complexity decision making for assessment and support, frequent evaluation and titration of therapies, application of advanced monitoring technologies and extensive interpretation of multiple databases. Critical Care Time devoted to patient care services described in this note is 35 minutes.   Berthe Oley, MD Pulmonary and Critical Care Medicine [facility] Pager: [phone]  12/13/2012, 6:33 AM

## 2012-12-13 NOTE — Progress Notes (Signed)
B/p down again 87/34.  Called and notified dr. Lanae Boast.  Orders for another 500cc bolus

## 2012-12-13 NOTE — Progress Notes (Signed)
ANTICOAGULATION CONSULT NOTE - Follow Up Consult  Pharmacy Consult for Coumadin Indication: Mechanical valve , CAF  Allergies  Allergen Reactions  . Promethazine Hcl     REACTION: made her real shaky  . Promethazine Hcl     Patient Measurements: Height: 4\' 11"  (149.9 cm) Weight: 159 lb (72.122 kg) IBW/kg (Calculated) : 43.2  Vital Signs: Temp: 98.3 F (36.8 C) (04/19 0600) BP: 118/66 mmHg (04/19 0630) Pulse Rate: 72 (04/19 0630)  Labs:  Recent Labs  12/11/12 0858 12/11/12 0903 12/12/12 1546 12/12/12 1634 12/13/12 0025 12/13/12 0555  HGB  --   --  12.1  --   --  9.7*  HCT  --   --  35.9*  --   --  29.4*  PLT  --   --  198  --   --  148*  LABPROT  --   --   --  25.4*  --  26.2*  INR 3.7  --   --  2.44*  --  2.55*  CREATININE  --  0.9 1.09  --   --  0.94  TROPONINI  --   --   --   --  <0.30  --     Estimated Creatinine Clearance: 55.1 ml/min (by C-G formula based on Cr of 0.94).   Medications:  Scheduled:  . allopurinol  300 mg Oral Daily  . aspirin EC  81 mg Oral Daily  . atorvastatin  10 mg Oral q1800  . [COMPLETED] cefTRIAXone (ROCEPHIN)  IV  1 g Intravenous Once  . [COMPLETED] colchicine  0.6 mg Oral Once  . folic acid  1 mg Oral Daily  . HYDROmorphone      . loratadine  10 mg Oral Daily  . pantoprazole  40 mg Oral Daily  . piperacillin-tazobactam (ZOSYN)  IV  3.375 g Intravenous Q8H  . [COMPLETED] sodium chloride  1,000 mL Intravenous Once  . [COMPLETED] sodium chloride  500 mL Intravenous Once  . [COMPLETED] sodium chloride  500 mL Intravenous Once  . [COMPLETED] sodium chloride  500 mL Intravenous Once  . [COMPLETED] sodium chloride  500 mL Intravenous Once  . sodium chloride  3 mL Intravenous Q12H  . vancomycin  1,250 mg Intravenous Q24H  . [COMPLETED] vancomycin  1,000 mg Intravenous Once  . [COMPLETED] warfarin  2 mg Oral NOW  . Warfarin - Pharmacist Dosing Inpatient   Does not apply q1800  . [DISCONTINUED] metoprolol succinate  50 mg Oral  Daily  . [DISCONTINUED] piperacillin-tazobactam (ZOSYN)  IV  3.375 g Intravenous Q6H    Assessment: 62 y/o F with rheumatic heart disease and MV replacement/TV repair 2002, chronic atrial fibrillation, and chronic diastolic CHF (EF 40-98%). Patient is to continue chronic coumadin therapy per pharmacy.  Home dose: Coumadin 2mg  daily except 1mg  on Mondays.  INR today is 2.55. No bleeding noted. H/H and plts have decreased some since admission. Will continue to follow.  Goal of Therapy:  INR 2.5-3.5  Monitor platelets by anticoagulation protocol: Yes  Plan:  - Coumadin 2mg  x1 tonight - Daily PT/INR  Lillia Pauls, PharmD Clinical Pharmacist Pager: [phone] Phone: [phone] 12/13/2012 8:01 AM

## 2012-12-13 NOTE — Progress Notes (Signed)
eLink Physician-Brief Progress Note Patient Name: Deborah [name] DOB: 1951-04-21 MRN: [id number]  Date of Service  12/13/2012   HPI/Events of Note  23 F with h/o of CHF admitted with UTI and hypotension.  BNP is greater than 1000 but CXR earlier had been essentially clear with lactate of 1.7.  WBC was elevated.  On ABX.  Has received 2 liters when contacted by [facility].  At point patient via camera evaluation in no resp distress, with UOP, and MAP of 59.  During camera conversation BP increased to MAP of 74 but dropped again.  Patient receiving additional fluid per recommendation.  Phone call from nurse reporting that patient is clammy and states she does not feel well.  BP again 85/51 (59) currently receiving additional liter of fluid.  O2 sats have consistently been greater than 95% with RR in the 15 to 20 range with no excessive WOB.  HR is 60s to 70s.   eICU Interventions  Plan: PCXR ABG Recheck lactate and BNP PCCM to evaluate at bedside   Intervention Category Intermediate Interventions: Hypotension - evaluation and management  DETERDING,ELIZABETH 12/13/2012, 5:23 AM

## 2012-12-13 NOTE — Progress Notes (Addendum)
[facility] PROGRESS NOTE  Assessment/Plan: PNA/SIRS: - d/c vanc and zosyn start rocephin and Azithro. CXR shows New infiltrate - Leukocytosis resolved. - Start steroids - Blood culture and urine culture pending. - Lactate < 2.0, KVO IV fluids. - blood pressure cont to be soft, monitor.   Hypotension - Avoid antihypertensive. - Lactate < 2.0. - Strict I and O's monitor Cr. - PT/INR unchanged.   Diastolic CHF, chronic - positive about 2 L. - dc/d IV fluids. - CXR: worsening Left basilar opacity.  Multiple Joint pain/GOUT: - check uric acid. - Steroids should help.  Chronic anticoagulation - Therapeutic  Chronic atrial fibrillation: - rate control. - INR thx.    Code Status: Full Code Family Communication: Spoke with family at bedside  Disposition Plan: SDU.   Consultants:  none  Procedures: CXR : Mildly worsening left basilar airspace opacification raises  concern for pneumonia, though mild asymmetric interstitial edema  might have a similar appearance    Antibiotics:  Rocephin  HPI/Subjective: Feels week. Still SOB, with cough.  Objective: Filed Vitals:   12/13/12 0604 12/13/12 0615 12/13/12 0630 12/13/12 0800  BP: 105/48 95/51 118/66   Pulse: 78 64 72   Temp:    98.5 F (36.9 C)  TempSrc:    Oral  Resp: 17 20 22    Height:      Weight:      SpO2: 100% 99% 99%     Intake/Output Summary (Last 24 hours) at 12/13/12 0820 Last data filed at 12/13/12 0600  Gross per 24 hour  Intake   2763 ml  Output    300 ml  Net   2463 ml   Filed Weights   12/12/12 1528  Weight: 72.122 kg (159 lb)    Exam:  General: Alert, awake, oriented x3, in no acute distress.  HEENT: No bruits, no goiter. Mild JVD Heart: Regular rate and rhythm, without murmurs, rubs, gallops.  Lungs: Good air movement, wheezing on left side with crackle son the left side. Abdomen: Soft, nontender, nondistended, positive bowel sounds.  Neuro: Grossly intact,  nonfocal.   Data Reviewed: Basic Metabolic Panel:  Recent Labs Lab 12/11/12 0903 12/12/12 1546 12/13/12 0555  NA 139 132* 137  K 4.1 3.8 3.6  CL 103 97 106  CO2 28 25 23   GLUCOSE 114* 148* 163*  BUN 21 18 15   CREATININE 0.9 1.09 0.94  CALCIUM 8.8 9.1 7.3*   Liver Function Tests: No results found for this basename: AST, ALT, ALKPHOS, BILITOT, PROT, ALBUMIN,  in the last 168 hours No results found for this basename: LIPASE, AMYLASE,  in the last 168 hours No results found for this basename: AMMONIA,  in the last 168 hours CBC:  Recent Labs Lab 12/12/12 1546 12/13/12 0555  WBC 12.7* 8.0  NEUTROABS 10.6*  --   HGB 12.1 9.7*  HCT 35.9* 29.4*  MCV 70.3* 70.5*  PLT 198 148*   Cardiac Enzymes:  Recent Labs Lab 12/13/12 0025  TROPONINI <0.30   BNP (last 3 results)  Recent Labs  12/11/12 0903 12/12/12 1546 12/13/12 0600  PROBNP 96.0 1611.0* 1094.0*   CBG:  Recent Labs Lab 12/13/12 0634  GLUCAP 147*    Recent Results (from the past 240 hour(s))  MRSA PCR SCREENING     Status: None   Collection Time    12/12/12 11:08 PM      Result Value Range Status   MRSA by PCR NEGATIVE  NEGATIVE Final   Comment:  The GeneXpert MRSA Assay (FDA     approved for NASAL specimens     only), is one component of a     comprehensive MRSA colonization     surveillance program. It is not     intended to diagnose MRSA     infection nor to guide or     monitor treatment for     MRSA infections.     Studies: Dg Chest 2 View  12/12/2012  *RADIOLOGY REPORT*  Clinical Data: Chest pain.  CHEST - 2 VIEW  Comparison: 10/02/2011.  Findings: The heart is enlarged but stable.  There are surgical changes from bypass surgery.  The lungs are clear except for chronic left basilar scarring and mild pleural thickening.  No edema or pneumothorax.  The bony thorax is intact.  IMPRESSION: Chronic left basilar scarring changes. Stable cardiac enlargement. No infiltrates or  effusions.   Original Report Authenticated By: Rudie Meyer, M.D.    Dg Chest Port 1 View  12/13/2012  *RADIOLOGY REPORT*  Clinical Data: Dizziness when supine; shortness of breath. Evaluate for pulmonary edema.  PORTABLE CHEST - 1 VIEW  Comparison: Chest radiograph performed 12/12/2012  Findings: The lungs are relatively well expanded.  Mildly worsening left basilar airspace opacification raises concern for pneumonia, though mild asymmetric interstitial edema might have a similar appearance.  A small left pleural effusion is seen.  This appears superimposed on the patient's chronic lung changes.  No pneumothorax is seen.  The cardiomediastinal silhouette is mildly enlarged.  The patient is status post median sternotomy.  No acute osseous abnormalities are seen.  IMPRESSION:  1.  Mildly worsening left basilar airspace opacification raises concern for pneumonia, though mild asymmetric interstitial edema might have a similar appearance.  Small left pleural effusion seen. Findings appear superimposed on the patient's chronic lung changes. 2.  Mild cardiomegaly.   Original Report Authenticated By: Tonia Ghent, M.D.     Scheduled Meds: . allopurinol  300 mg Oral Daily  . aspirin EC  81 mg Oral Daily  . atorvastatin  10 mg Oral q1800  . folic acid  1 mg Oral Daily  . HYDROmorphone      . loratadine  10 mg Oral Daily  . pantoprazole  40 mg Oral Daily  . piperacillin-tazobactam (ZOSYN)  IV  3.375 g Intravenous Q8H  . sodium chloride  3 mL Intravenous Q12H  . vancomycin  1,250 mg Intravenous Q24H  . warfarin  2 mg Oral ONCE-1800  . Warfarin - Pharmacist Dosing Inpatient   Does not apply q1800   Continuous Infusions: . sodium chloride 10 mL/hr at 12/13/12 0442     Marinda Elk  [facility] Pager [phone]. If 8PM-8AM, please contact night-coverage at [email or web address], password [facility] 12/13/2012, 8:20 AM  LOS: 1 day

## 2012-12-13 NOTE — Progress Notes (Signed)
Pt c/o pain in both hands now. Rates a 8/10.  Calling dr. Julian Reil

## 2012-12-13 NOTE — Progress Notes (Signed)
B/p 83/43   Pt c/o breathing just slighly worse.  Pale and clammy to tounch .  Called and notified dr. Darrick Penna.  Continue to monitor pt closely.  And continue with 1000 cc bolus.

## 2012-12-13 NOTE — Progress Notes (Signed)
ANTIBIOTIC CONSULT NOTE - INITIAL  Pharmacy Consult for vancomycin  Indication: R/o UTI  Allergies  Allergen Reactions  . Promethazine Hcl     REACTION: made her real shaky  . Promethazine Hcl     Patient Measurements: Height: 4\' 11"  (149.9 cm) Weight: 159 lb (72.122 kg) IBW/kg (Calculated) : 43.2  Vital Signs: Temp: 99.1 F (37.3 C) (04/19 0300) Temp src: Oral (04/18 1945) BP: 104/56 mmHg (04/19 0400) Pulse Rate: 89 (04/19 0400) Intake/Output from previous day: 04/18 0701 - 04/19 0700 In: 1000 [IV Piggyback:1000] Out: 300 [Urine:300] Intake/Output from this shift: Total I/O In: 1000 [IV Piggyback:1000] Out: 300 [Urine:300]  Labs:  Recent Labs  12/11/12 0903 12/12/12 1546  WBC  --  12.7*  HGB  --  12.1  PLT  --  198  CREATININE 0.9 1.09   Estimated Creatinine Clearance: 47.5 ml/min (by C-G formula based on Cr of 1.09). No results found for this basename: VANCOTROUGH, Leodis Binet, VANCORANDOM, GENTTROUGH, GENTPEAK, GENTRANDOM, TOBRATROUGH, TOBRAPEAK, TOBRARND, AMIKACINPEAK, AMIKACINTROU, AMIKACIN,  in the last 72 hours   Microbiology: Recent Results (from the past 720 hour(s))  MRSA PCR SCREENING     Status: None   Collection Time    12/12/12 11:08 PM      Result Value Range Status   MRSA by PCR NEGATIVE  NEGATIVE Final   Comment:            The GeneXpert MRSA Assay (FDA     approved for NASAL specimens     only), is one component of a     comprehensive MRSA colonization     surveillance program. It is not     intended to diagnose MRSA     infection nor to guide or     monitor treatment for     MRSA infections.    Medical History: Past Medical History  Diagnosis Date  . Rheumatic heart disease     Status post mechanical (St. Jude) mitral valve replacement and tricuspid valve repair at [facility] in 2002; echo 5/12:   EF 60-65%, mild AI, mitral valve prosthesis with AVA 1.66, severe LAE, moderate RAE, moderate to severe TR, mild increased pulmonary artery  systolic pressure;    Adenosine Cardiolite in 4/09:   No ischemia, EF 66%  . Chronic atrial fibrillation   . Chronic anticoagulation   . Fatigue   . Borderline diabetes   . History of mitral valve replacement 2002    St. Jude  . Hyperlipidemia   . Obesity   . Anemia   . Palpitations   . Headache   . Gout   . Hypertension   . Carpal tunnel syndrome   . External hemorrhoids   . Rotator cuff syndrome   . 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 CHF (congestive heart failure)     nl EF    Medications:  Scheduled:  . allopurinol  300 mg Oral Daily  . aspirin EC  81 mg Oral Daily  . atorvastatin  10 mg Oral q1800  . [COMPLETED] cefTRIAXone (ROCEPHIN)  IV  1 g Intravenous Once  . [COMPLETED] colchicine  0.6 mg Oral Once  . folic acid  1 mg Oral Daily  . HYDROmorphone      . loratadine  10 mg Oral Daily  . metoprolol succinate  50 mg Oral Daily  . pantoprazole  40 mg Oral Daily  . piperacillin-tazobactam (ZOSYN)  IV  3.375 g Intravenous Q8H  . [  COMPLETED] sodium chloride  500 mL Intravenous Once  . [COMPLETED] sodium chloride  500 mL Intravenous Once  . [COMPLETED] sodium chloride  500 mL Intravenous Once  . [COMPLETED] sodium chloride  500 mL Intravenous Once  . sodium chloride  3 mL Intravenous Q12H  . [COMPLETED] warfarin  2 mg Oral NOW  . Warfarin - Pharmacist Dosing Inpatient   Does not apply q1800  . [DISCONTINUED] piperacillin-tazobactam (ZOSYN)  IV  3.375 g Intravenous Q6H   Assessment: 62 yo female known to pharmacy from warfarin management. Pharmacy to now manage vancomycin for r/o UTI. Patient is also to receive Zosyn.   Goal of Therapy:  Vancomycin trough level 10-15 mcg/ml  Plan:  1. Vancomycin 1gm IV x 1 now, then 1250mg  IV Q24H.   Emeline Gins 12/13/2012,4:17 AM

## 2012-12-13 NOTE — Procedures (Signed)
Arterial Catheter Insertion Procedure Note Deborah [name] [id number] 07/25/1951  Procedure: Insertion of Arterial Catheter  Indications: Blood pressure monitoring  Procedure Details Consent: Risks of procedure as well as the alternatives and risks of each were explained to the (patient/caregiver).  Consent for procedure obtained. Time Out: Verified patient identification, verified procedure, site/side was marked, verified correct patient position, special equipment/implants available, medications/allergies/relevent history reviewed, required imaging and test results available.  Performed  Maximum sterile technique was used including antiseptics, gloves, gown, hand hygiene, mask and sheet. Skin prep: Chlorhexidine; local anesthetic administered 20 gauge catheter was inserted into right radial artery using the Seldinger technique.  Evaluation Blood flow good; BP tracing good. Complications: No apparent complications.   Deborah [name], [name] 12/13/2012

## 2012-12-14 DIAGNOSIS — R6510 Systemic inflammatory response syndrome (SIRS) of non-infectious origin without acute organ dysfunction: Secondary | ICD-10-CM

## 2012-12-14 DIAGNOSIS — D509 Iron deficiency anemia, unspecified: Secondary | ICD-10-CM

## 2012-12-14 DIAGNOSIS — J189 Pneumonia, unspecified organism: Principal | ICD-10-CM

## 2012-12-14 DIAGNOSIS — I099 Rheumatic heart disease, unspecified: Secondary | ICD-10-CM

## 2012-12-14 LAB — URINE CULTURE: Colony Count: 35000

## 2012-12-14 LAB — CBC
MCH: 23.7 pg — ABNORMAL LOW (ref 26.0–34.0)
MCHC: 34 g/dL (ref 30.0–36.0)
MCV: 69.5 fL — ABNORMAL LOW (ref 78.0–100.0)
Platelets: 185 10*3/uL (ref 150–400)
RDW: 15.6 % — ABNORMAL HIGH (ref 11.5–15.5)

## 2012-12-14 MED ORDER — WARFARIN SODIUM 2 MG PO TABS
2.0000 mg | ORAL_TABLET | Freq: Once | ORAL | Status: AC
  Administered 2012-12-14: 2 mg via ORAL
  Filled 2012-12-14 (×2): qty 1

## 2012-12-14 MED ORDER — METOPROLOL TARTRATE 12.5 MG HALF TABLET
12.5000 mg | ORAL_TABLET | Freq: Two times a day (BID) | ORAL | Status: DC
  Administered 2012-12-14: 12.5 mg via ORAL
  Filled 2012-12-14 (×3): qty 1

## 2012-12-14 NOTE — Progress Notes (Signed)
PULMONARY  / CRITICAL CARE MEDICINE  Name: Deborah [name] MRN: [id number] DOB: 09-30-1950    ADMISSION DATE:  12/12/2012 CONSULTATION DATE:  12/14/02  REFERRING MD :  Dr. Lyda Perone PRIMARY SERVICE: [facility]  CHIEF COMPLAINT:  Hypotension  BRIEF PATIENT DESCRIPTION: 62 yo F with rheumatic heart disease s/p MVR and afib admitted with fever and hypotension likely 2/2 urosepsis.  SIGNIFICANT EVENTS / STUDIES:  4/18 Admitted 4/19 Hypotension  LINES / TUBES: A-line 4/19>>>   CULTURES: Urine Cx 4/18 >>> Blood Cx 4/18 >>>  ANTIBIOTICS: Vanc 4/19 >>> Zosyn 4/19 >>>  HISTORY OF PRESENT ILLNESS:  62 yp F with rheumatic heart disease s/p MVR (St. Jude)/TVR, chronic diastolic CHF,  atrial fibrillation, HLD, and HTN.  She came to the ED with fever to 102, chills, fatigue, and dysuria.  She has also had some mild chest pressure and cough.  She was admitted and noted to be mildly hypotensive with response to IVF.  Overnight had additional episodes of hypotension and currently getting 4th liter NS since admission.   PAST MEDICAL HISTORY :  Past Medical History  Diagnosis Date  . Rheumatic heart disease     Status post mechanical (St. 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 Chronic atrial fibrillation   . Chronic anticoagulation   . Fatigue   . Borderline diabetes   . History of mitral valve replacement 2002    St. Jude  . Hyperlipidemia   . Obesity   . Anemia   . Palpitations   . Headache   . Gout   . Hypertension   . Carpal tunnel syndrome   . External hemorrhoids   . Rotator cuff syndrome   . 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 CHF (congestive heart failure)      nl EF   Past Surgical History  Procedure Laterality Date  . Tricuspid valve repair    . Cardiac catheterization      EF 55%   MEDS: . aspirin EC  81 mg Oral Daily  . atorvastatin  10 mg Oral q1800  . azithromycin  500 mg Intravenous Q24H  . cefTRIAXone (ROCEPHIN)  IV  1 g Intravenous Q24H  . folic acid  1 mg Oral Daily  . loratadine  10 mg Oral Daily  . methylPREDNISolone (SOLU-MEDROL) injection  60 mg Intravenous Q12H  . pantoprazole  40 mg Oral Daily  . sodium chloride  3 mL Intravenous Q12H  . warfarin  2 mg Oral ONCE-1800  . Warfarin - Pharmacist Dosing Inpatient   Does not apply q1800   Allergies  Allergen Reactions  . Promethazine Hcl     REACTION: made her real shaky  . Promethazine Hcl     SUBJECTIVE:   VITAL SIGNS: Temp:  [97.4 F (36.3 C)-97.9 F (36.6 C)] 97.4 F (36.3 C) (04/20 0742) Pulse Rate:  [59-105] 81 (04/20 1000) Resp:  [11-31] 24 (04/20 1000) BP: (85-138)/(52-73) 107/72 mmHg (04/20 1000) SpO2:  [96 %-100 %] 100 % (04/20 1000) HEMODYNAMICS:   VENTILATOR SETTINGS:   INTAKE / OUTPUT: Intake/Output     04/19 0701 - 04/20 0700 04/20 0701 - 04/21 0700   P.O. 1280 360  I.V. (mL/kg) 130 (1.8)    IV Piggyback 300    Total Intake(mL/kg) 1710 (23.7) 360 (5)   Urine (mL/kg/hr) 2050 (1.2)    Emesis/NG output 200 (0.1)    Total Output 2250     Net -540 +360        Urine Occurrence 400 x     PHYSICAL EXAMINATION: General:  appears tired but NAD Neuro:  Awake, alert, HEENT:  Sclera clear, MMM, EOMI Cardiovascular:  Irregular irregular, mechanical heart sounds Lungs:  CTAB Abdomen:  Soft, NT, ND Musculoskeletal:  Joints wnl Skin:  Warm, no rash  LABS:  Recent Labs Lab 12/11/12 0903  12/12/12 1546 12/12/12 1634 12/12/12 1703 12/13/12 0025 12/13/12 0555 12/13/12 0600 12/13/12 0828 12/13/12 1615 12/14/12 0600 12/14/12 0801  HGB  --   < > 12.1  --   --   --  9.7*  --  14.3  --   --  9.7*  WBC  --   --  12.7*  --   --   --  8.0  --    --   --   --  8.0  PLT  --   --  198  --   --   --  148*  --   --   --   --  185  NA 139  --  132*  --   --   --  137  --  142  --   --   --   K 4.1  --  3.8  --   --   --  3.6  --  3.3*  --   --   --   CL 103  --  97  --   --   --  106  --  108  --   --   --   CO2 28  --  25  --   --   --  23  --   --   --   --   --   GLUCOSE 114*  --  148*  --   --   --  163*  --  159*  --   --   --   BUN 21  --  18  --   --   --  15  --  13  --   --   --   CREATININE 0.9  --  1.09  --   --   --  0.94  --  0.80  --   --   --   CALCIUM 8.8  --  9.1  --   --   --  7.3*  --   --   --   --   --   INR  --   --   --  2.44*  --   --  2.55*  --   --   --  3.27*  --   LATICACIDVEN  --   --   --   --  1.76  --  1.0  --   --   --   --   --   TROPONINI  --   --   --   --   --  <0.30 <0.30  --   --  <0.30  --   --   PROBNP 96.0  --  1611.0*  --   --   --   --  1094.0*  --   --   --   --   < > =  values in this interval not displayed.  Recent Labs Lab 12/13/12 0634  GLUCAP 147*    CXR 4/19:  IMPRESSION:  1. Mildly worsening left basilar airspace opacification raises  concern for pneumonia, though mild asymmetric interstitial edema  might have a similar appearance. Small left pleural effusion seen.  Findings appear superimposed on the patient's chronic lung changes.  2. Mild cardiomegaly.    ASSESSMENT / PLAN:  PULMONARY  A: ?LLL opac- infiltate, Atx, Effusion P:  - O2 by  to keep sats>90% - Solumedrol 60mg  Q12h - Rochephin, Zithromax IV   CARDIOVASCULAR A: Hypotension - likely 2/2 urosepsis with underlying heart disease P:  - Appears to be perfusing well with low lactate, UOP and good mentation - given 4L NS intitially - If persistent hypotension will need to consider central line, a-line for CVP and pressors - Hold metoprolol given hypotension - Cardiology following   HEMATOLOGIC A:  Anticoagulation in setting of MVR P:  - Continue coumadin  INFECTIOUS A:  Sepsis likely urosepsis P:    - on Zithromax, Rocephin per [facility] - F/u urine and blood cultures    Aariah Godette M, MD 12/14/2012, 10:45 AM

## 2012-12-14 NOTE — Progress Notes (Signed)
[facility] PROGRESS NOTE  Assessment/Plan: PNA/SIRS: - Start rocephin and Azithro 4.19.2014.  - Start steroids, might cause and increase in WBC. - Blood culture and urine culture negative till date. - blood pressure cont to be soft, monitor. - Avoid IV fluids.   Hypotension - Avoid antihypertensive. Blood pressure teneuos.  - Strict I and O's -500cc - PT/INR unchanged.   Diastolic CHF, chronic - positive about 2 L. Diuresing with out lasix. - KVO IV fluids. - CXR: worsening Left basilar opacity.  Multiple Joint pain/GOUT: - check uric acid. - Steroids should help.  Chronic anticoagulation - Therapeutic  Chronic atrial fibrillation: - rate control. - INR thx.    Code Status: Full Code Family Communication: Spoke with family at bedside  Disposition Plan: SDU.   Consultants:  none  Procedures: CXR : Mildly worsening left basilar airspace opacification raises  concern for pneumonia, though mild asymmetric interstitial edema  might have a similar appearance    Antibiotics:  Rocephin  HPI/Subjective: Feels SOB is better. Less week than yesterday  Objective: Filed Vitals:   12/14/12 0200 12/14/12 0300 12/14/12 0400 12/14/12 0500  BP: 85/52 92/53 93/53  109/67  Pulse: 67 77 71 105  Temp:   97.5 F (36.4 C)   TempSrc:   Axillary   Resp: 11 15 15 16   Height:      Weight:      SpO2: 100% 100% 100% 100%    Intake/Output Summary (Last 24 hours) at 12/14/12 0728 Last data filed at 12/14/12 0000  Gross per 24 hour  Intake   1710 ml  Output   2250 ml  Net   -540 ml   Filed Weights   12/12/12 1528  Weight: 72.122 kg (159 lb)    Exam:  General: Alert, awake, oriented x3, in no acute distress.  HEENT: No bruits, no goiter. Mild JVD Heart: Regular rate and rhythm, without murmurs, rubs, gallops.  Lungs: Good air movement,  crackle son the left side. Abdomen: Soft, nontender, nondistended, positive bowel sounds.  Neuro: Grossly intact,  nonfocal.   Data Reviewed: Basic Metabolic Panel:  Recent Labs Lab 12/11/12 0903 12/12/12 1546 12/13/12 0555 12/13/12 0828  NA 139 132* 137 142  K 4.1 3.8 3.6 3.3*  CL 103 97 106 108  CO2 28 25 23   --   GLUCOSE 114* 148* 163* 159*  BUN 21 18 15 13   CREATININE 0.9 1.09 0.94 0.80  CALCIUM 8.8 9.1 7.3*  --    Liver Function Tests: No results found for this basename: AST, ALT, ALKPHOS, BILITOT, PROT, ALBUMIN,  in the last 168 hours No results found for this basename: LIPASE, AMYLASE,  in the last 168 hours No results found for this basename: AMMONIA,  in the last 168 hours CBC:  Recent Labs Lab 12/12/12 1546 12/13/12 0555 12/13/12 0828  WBC 12.7* 8.0  --   NEUTROABS 10.6*  --   --   HGB 12.1 9.7* 14.3  HCT 35.9* 29.4* 42.0  MCV 70.3* 70.5*  --   PLT 198 148*  --    Cardiac Enzymes:  Recent Labs Lab 12/13/12 0025 12/13/12 0555 12/13/12 1615  TROPONINI <0.30 <0.30 <0.30   BNP (last 3 results)  Recent Labs  12/11/12 0903 12/12/12 1546 12/13/12 0600  PROBNP 96.0 1611.0* 1094.0*   CBG:  Recent Labs Lab 12/13/12 0634  GLUCAP 147*    Recent Results (from the past 240 hour(s))  MRSA PCR SCREENING     Status: None   Collection  Time    12/12/12 11:08 PM      Result Value Range Status   MRSA by PCR NEGATIVE  NEGATIVE Final   Comment:            The GeneXpert MRSA Assay (FDA     approved for NASAL specimens     only), is one component of a     comprehensive MRSA colonization     surveillance program. It is not     intended to diagnose MRSA     infection nor to guide or     monitor treatment for     MRSA infections.     Studies: Dg Chest 2 View  12/12/2012  *RADIOLOGY REPORT*  Clinical Data: Chest pain.  CHEST - 2 VIEW  Comparison: 10/02/2011.  Findings: The heart is enlarged but stable.  There are surgical changes from bypass surgery.  The lungs are clear except for chronic left basilar scarring and mild pleural thickening.  No edema or  pneumothorax.  The bony thorax is intact.  IMPRESSION: Chronic left basilar scarring changes. Stable cardiac enlargement. No infiltrates or effusions.   Original Report Authenticated By: Rudie Meyer, M.D.    Dg Chest Port 1 View  12/13/2012  *RADIOLOGY REPORT*  Clinical Data: Dizziness when supine; shortness of breath. Evaluate for pulmonary edema.  PORTABLE CHEST - 1 VIEW  Comparison: Chest radiograph performed 12/12/2012  Findings: The lungs are relatively well expanded.  Mildly worsening left basilar airspace opacification raises concern for pneumonia, though mild asymmetric interstitial edema might have a similar appearance.  A small left pleural effusion is seen.  This appears superimposed on the patient's chronic lung changes.  No pneumothorax is seen.  The cardiomediastinal silhouette is mildly enlarged.  The patient is status post median sternotomy.  No acute osseous abnormalities are seen.  IMPRESSION:  1.  Mildly worsening left basilar airspace opacification raises concern for pneumonia, though mild asymmetric interstitial edema might have a similar appearance.  Small left pleural effusion seen. Findings appear superimposed on the patient's chronic lung changes. 2.  Mild cardiomegaly.   Original Report Authenticated By: Tonia Ghent, M.D.     Scheduled Meds: . aspirin EC  81 mg Oral Daily  . atorvastatin  10 mg Oral q1800  . azithromycin  500 mg Intravenous Q24H  . cefTRIAXone (ROCEPHIN)  IV  1 g Intravenous Q24H  . folic acid  1 mg Oral Daily  . loratadine  10 mg Oral Daily  . methylPREDNISolone (SOLU-MEDROL) injection  60 mg Intravenous Q12H  . pantoprazole  40 mg Oral Daily  . sodium chloride  3 mL Intravenous Q12H  . Warfarin - Pharmacist Dosing Inpatient   Does not apply q1800   Continuous Infusions:     Marinda Elk  [facility] Pager [phone]. If 8PM-8AM, please contact night-coverage at [email or web address], password [facility] 12/14/2012, 7:28 AM  LOS: 2 days

## 2012-12-14 NOTE — Progress Notes (Signed)
ANTICOAGULATION CONSULT NOTE - Follow Up Consult  Pharmacy Consult for Coumadin Indication: Mechanical valve , CAF  Allergies  Allergen Reactions  . Promethazine Hcl     REACTION: made her real shaky  . Promethazine Hcl     Patient Measurements: Height: 4\' 11"  (149.9 cm) Weight: 159 lb (72.122 kg) IBW/kg (Calculated) : 43.2  Vital Signs: Temp: 97.4 F (36.3 C) (04/20 0742) Temp src: Oral (04/20 0742) BP: 109/64 mmHg (04/20 0800) Pulse Rate: 59 (04/20 0800)  Labs:  Recent Labs  12/12/12 1546 12/12/12 1634 12/13/12 0025 12/13/12 0555 12/13/12 0828 12/13/12 1615 12/14/12 0600 12/14/12 0801  HGB 12.1  --   --  9.7* 14.3  --   --  9.7*  HCT 35.9*  --   --  29.4* 42.0  --   --  28.5*  PLT 198  --   --  148*  --   --   --  185  LABPROT  --  25.4*  --  26.2*  --   --  31.5*  --   INR  --  2.44*  --  2.55*  --   --  3.27*  --   CREATININE 1.09  --   --  0.94 0.80  --   --   --   TROPONINI  --   --  <0.30 <0.30  --  <0.30  --   --     Estimated Creatinine Clearance: 64.7 ml/min (by C-G formula based on Cr of 0.8).   Medications:  Scheduled:  . aspirin EC  81 mg Oral Daily  . atorvastatin  10 mg Oral q1800  . azithromycin  500 mg Intravenous Q24H  . cefTRIAXone (ROCEPHIN)  IV  1 g Intravenous Q24H  . folic acid  1 mg Oral Daily  . [EXPIRED] HYDROmorphone      . loratadine  10 mg Oral Daily  . methylPREDNISolone (SOLU-MEDROL) injection  60 mg Intravenous Q12H  . pantoprazole  40 mg Oral Daily  . [COMPLETED] potassium chloride  40 mEq Oral BID  . sodium chloride  3 mL Intravenous Q12H  . [COMPLETED] warfarin  2 mg Oral ONCE-1800  . Warfarin - Pharmacist Dosing Inpatient   Does not apply q1800    Assessment: 62 y/o F with rheumatic heart disease and MV replacement/TV repair 2002, chronic atrial fibrillation, and chronic diastolic CHF (EF 78-29%). Patient is to continue chronic coumadin therapy per pharmacy.  Home dose: Coumadin 2mg  daily except 1mg  on  Mondays.  INR today is still therapeutic at 3.27. No bleeding noted. H/H and plts variable trends likely due to dilution. No bleeding noted. Will continue to follow.  Goal of Therapy:  INR 2.5-3.5  Monitor platelets by anticoagulation protocol: Yes  Plan:  - Coumadin 2mg  x1 tonight - Daily PT/INR - Monitor for bleeding   Thank you, Franchot Erichsen, Pharm.D. Clinical Pharmacist   Pager: [phone] 12/14/2012 10:21 AM

## 2012-12-14 NOTE — Progress Notes (Signed)
Paged Brion Aliment, NP regarding pt BP and HR trending back up (BP 130s-150s systolic and HR 90s-110s a-fib).  Orders received to give 12.5mg  lopressor po BID (include now).  Will continue to monitor.  Salomon Mast, RN

## 2012-12-14 NOTE — Progress Notes (Signed)
Patient ID: Deborah [name], female   DOB: 03-08-1951, 62 y.o.   MRN: [id number]   SUBJECTIVE:  Feeling better today.  ? LLL PNA on CXR.  SBP 90s-100s, stable.  HR in 80s-90s off Toprol.  Afebrile.    Marland Kitchen aspirin EC  81 mg Oral Daily  . atorvastatin  10 mg Oral q1800  . azithromycin  500 mg Intravenous Q24H  . cefTRIAXone (ROCEPHIN)  IV  1 g Intravenous Q24H  . folic acid  1 mg Oral Daily  . loratadine  10 mg Oral Daily  . methylPREDNISolone (SOLU-MEDROL) injection  60 mg Intravenous Q12H  . pantoprazole  40 mg Oral Daily  . sodium chloride  3 mL Intravenous Q12H  . warfarin  2 mg Oral ONCE-1800  . Warfarin - Pharmacist Dosing Inpatient   Does not apply q1800      Filed Vitals:   12/14/12 0742 12/14/12 0800 12/14/12 0900 12/14/12 1000  BP: 102/57 109/64 109/72 107/72  Pulse: 73 59 79 81  Temp: 97.4 F (36.3 C)     TempSrc: Oral     Resp: 19 17 21 24   Height:      Weight:      SpO2: 99% 97% 97% 100%    Intake/Output Summary (Last 24 hours) at 12/14/12 1050 Last data filed at 12/14/12 0900  Gross per 24 hour  Intake   1630 ml  Output   2250 ml  Net   -620 ml    LABS: Basic Metabolic Panel:  Recent Labs  40/98/11 1546 12/13/12 0555 12/13/12 0828  NA 132* 137 142  K 3.8 3.6 3.3*  CL 97 106 108  CO2 25 23  --   GLUCOSE 148* 163* 159*  BUN 18 15 13   CREATININE 1.09 0.94 0.80  CALCIUM 9.1 7.3*  --    Liver Function Tests: No results found for this basename: AST, ALT, ALKPHOS, BILITOT, PROT, ALBUMIN,  in the last 72 hours No results found for this basename: LIPASE, AMYLASE,  in the last 72 hours CBC:  Recent Labs  12/12/12 1546 12/13/12 0555 12/13/12 0828 12/14/12 0801  WBC 12.7* 8.0  --  8.0  NEUTROABS 10.6*  --   --   --   HGB 12.1 9.7* 14.3 9.7*  HCT 35.9* 29.4* 42.0 28.5*  MCV 70.3* 70.5*  --  69.5*  PLT 198 148*  --  185   Cardiac Enzymes:  Recent Labs  12/13/12 0025 12/13/12 0555 12/13/12 1615  TROPONINI <0.30 <0.30 <0.30   BNP: No  components found with this basename: POCBNP,  D-Dimer: No results found for this basename: DDIMER,  in the last 72 hours Hemoglobin A1C: No results found for this basename: HGBA1C,  in the last 72 hours Fasting Lipid Panel: No results found for this basename: CHOL, HDL, LDLCALC, TRIG, CHOLHDL, LDLDIRECT,  in the last 72 hours Thyroid Function Tests: No results found for this basename: TSH, T4TOTAL, FREET3, T3FREE, THYROIDAB,  in the last 72 hours Anemia Panel: No results found for this basename: VITAMINB12, FOLATE, FERRITIN, TIBC, IRON, RETICCTPCT,  in the last 72 hours  RADIOLOGY: Dg Chest 2 View  12/12/2012  *RADIOLOGY REPORT*  Clinical Data: Chest pain.  CHEST - 2 VIEW  Comparison: 10/02/2011.  Findings: The heart is enlarged but stable.  There are surgical changes from bypass surgery.  The lungs are clear except for chronic left basilar scarring and mild pleural thickening.  No edema or pneumothorax.  The bony thorax is intact.  IMPRESSION: Chronic  left basilar scarring changes. Stable cardiac enlargement. No infiltrates or effusions.   Original Report Authenticated By: Rudie Meyer, M.D.    Dg Chest Port 1 View  12/13/2012  *RADIOLOGY REPORT*  Clinical Data: Dizziness when supine; shortness of breath. Evaluate for pulmonary edema.  PORTABLE CHEST - 1 VIEW  Comparison: Chest radiograph performed 12/12/2012  Findings: The lungs are relatively well expanded.  Mildly worsening left basilar airspace opacification raises concern for pneumonia, though mild asymmetric interstitial edema might have a similar appearance.  A small left pleural effusion is seen.  This appears superimposed on the patient's chronic lung changes.  No pneumothorax is seen.  The cardiomediastinal silhouette is mildly enlarged.  The patient is status post median sternotomy.  No acute osseous abnormalities are seen.  IMPRESSION:  1.  Mildly worsening left basilar airspace opacification raises concern for pneumonia, though mild  asymmetric interstitial edema might have a similar appearance.  Small left pleural effusion seen. Findings appear superimposed on the patient's chronic lung changes. 2.  Mild cardiomegaly.   Original Report Authenticated By: Tonia Ghent, M.D.    Dg Hand Complete Left  11/17/2012  *RADIOLOGY REPORT*  Clinical Data: Left hand pain, most prominent along the left fifth digit.  No known injury.  LEFT HAND - COMPLETE 3+ VIEW  Comparison: Right hand radiographs 11/17/2012  Findings: Normal bony mineralization and alignment.  There is joint space loss and osteophyte formation with prominent dorsal spurring spurring at the distal interphalangeal joint of the fifth digit. There is slight soft tissue prominence about the DIP joint of the fifth digit.  Mild / early degenerative changes are seen in the distal interphalangeal joints of the second through fourth digits. No bony erosions or fracture is identified.  IMPRESSION:  1.  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 2.  Very early degenerative changes of the distal interphalangeal joints of the second through fourth fingers.   Original Report Authenticated By: Britta Mccreedy, M.D.    Dg Hand Complete Right  11/17/2012  *RADIOLOGY REPORT*  Clinical Data: Right hand pain for 2-3 months.  RIGHT HAND - COMPLETE 3+ VIEW  Comparison: None.  Findings: Three views of the right hand were obtained.  There is normal alignment.  No evidence for acute fracture or dislocation. No significant soft tissue swelling.  No significant joint space narrowing or degenerative changes.  IMPRESSION: No acute bony abnormality.   Original Report Authenticated By: Richarda Overlie, M.D.     PHYSICAL EXAM General: NAD Neck: JVP 9-10 cm, no thyromegaly or thyroid nodule.  Lungs: Slight crackles at bases bilaterally.  CV: Nondisplaced PMI.  Heart regular S1/S2, no S3/S4, 2/6 systolic murmur LLSB.  No peripheral edema.  No carotid bruit.  Normal pedal pulses.    Abdomen: Soft, nontender, no hepatosplenomegaly, no distention.  Neurologic: Alert and oriented x 3.  Psych: Normal affect. Extremities: No clubbing or cyanosis.   TELEMETRY: Reviewed telemetry pt in NSR  ASSESSMENT AND PLAN: 62 yo with history of mechanical MV replacement/tricuspid valve repair with moderate to severe TR on last echo and diastolic CHF.  Also with chronic atrial fibrillation.  She presented with suspected urosepsis/hypotension but now has CXR concerning for LLL PNA..  1. ID: BP better this morning.  Would not given any further IV fluids.  UTI on UA but also with concern for LLL PNA on CXR.  Continue ceftriaxone/azithro, awaiting culture data.  2. Mechanical MVR: Coumadin with INR goal 2.5-3.5, ASA 81.  3. Diastolic  CHF: JVP elevated but also has chronic significant TR.  Would avoid further IV fluids. When she stabilizes, may need diuresis.  Comfortable at rest .  4. Chronic atrial fibrillation: Stable rate. When BP stable will need to restart Toprol XL.   Marca Ancona 12/14/2012 10:50 AM

## 2012-12-14 NOTE — Progress Notes (Signed)
Maculopapular lesions assessed to anterior and posterior chest. Pt denies itching or pain.  Lesions are diffuse and not located over dermatones. Will continue to monitor.

## 2012-12-14 NOTE — Evaluation (Signed)
Physical Therapy Evaluation Patient Details Name: Deborah [name] MRN: [id number] DOB: 18-Jan-1951 Today's Date: 12/14/2012 Time: 7829-5621 PT Time Calculation (min): 17 min  PT Assessment / Plan / Recommendation Clinical Impression  Patient admitted with suspected urosepsis/hypotension but now has CXR concerning for LLL PNA.  Patient did well with all mobility.  Do not feel further PT is indicated at this time.    PT Assessment  Patent does not need any further PT services    Follow Up Recommendations  No PT follow up    Does the patient have the potential to tolerate intense rehabilitation      Barriers to Discharge        Equipment Recommendations  None recommended by PT    Recommendations for Other Services     Frequency      Precautions / Restrictions Precautions Precautions: None   Pertinent Vitals/Pain No pain indicated      Mobility  Bed Mobility Bed Mobility: Supine to Sit Supine to Sit: 4: Min assist Details for Bed Mobility Assistance: assist to bring shoulders off bed Transfers Transfers: Sit to Stand;Stand to Sit Sit to Stand: 7: Independent Stand to Sit: 7: Independent Ambulation/Gait Ambulation/Gait Assistance: 7: Independent Ambulation Distance (Feet): 100 Feet Assistive device: None Gait Pattern: Step-through pattern Gait velocity: slightly decreased    Exercises     PT Diagnosis:    PT Problem List:   PT Treatment Interventions:     PT Goals    Visit Information  Last PT Received On: 12/14/12 Assistance Needed: +1    Subjective Data  Subjective: patient reports she was walking 30-40 min each day prior to admission Patient Stated Goal: Son:  does this mean she can cook dinner now?   Prior Functioning  Home Living Lives With: Family Available Help at Discharge: Family Type of Home: House Home Access: Stairs to enter [employer] of Steps: 2-3 Entrance Stairs-Rails: Right Home Layout: Two level;Able to live on main  level with bedroom/bathroom Home Adaptive Equipment: None Prior Function Level of Independence: Independent Able to Take Stairs?: Yes Communication Communication: No difficulties    Cognition  Cognition Arousal/Alertness: Awake/alert Behavior During Therapy: WFL for tasks assessed/performed Overall Cognitive Status: Within Functional Limits for tasks assessed    Extremity/Trunk Assessment Right Upper Extremity Assessment RUE ROM/Strength/Tone: [facility] for tasks assessed Left Upper Extremity Assessment LUE ROM/Strength/Tone: WFL for tasks assessed Right Lower Extremity Assessment RLE ROM/Strength/Tone: Within functional levels Left Lower Extremity Assessment LLE ROM/Strength/Tone: Within functional levels Trunk Assessment Trunk Assessment: Normal   Balance Balance Balance Assessed: Yes Dynamic Standing Balance Dynamic Standing - Comments: patient reports slightly lightheaded during gait; patient required min assist to recover from moderate perturbation  End of Session PT - End of Session Equipment Utilized During Treatment: Gait belt Activity Tolerance: Patient tolerated treatment well Patient left: in chair;with call bell/phone within reach;with family/visitor present  GP     Olivia Canter, [place] [phone] 12/14/2012, 2:42 PM

## 2012-12-15 LAB — BASIC METABOLIC PANEL
CO2: 26 mEq/L (ref 19–32)
Calcium: 8.7 mg/dL (ref 8.4–10.5)
Chloride: 108 mEq/L (ref 96–112)
Glucose, Bld: 150 mg/dL — ABNORMAL HIGH (ref 70–99)
Sodium: 140 mEq/L (ref 135–145)

## 2012-12-15 MED ORDER — ZOLPIDEM TARTRATE 5 MG PO TABS: 5.0000 mg | ORAL_TABLET | Freq: Every evening | ORAL | Status: DC | PRN

## 2012-12-15 MED ORDER — PREDNISONE 20 MG PO TABS
20.0000 mg | ORAL_TABLET | Freq: Every day | ORAL | Status: DC
  Administered 2012-12-16: 20 mg via ORAL
  Filled 2012-12-15 (×2): qty 1

## 2012-12-15 MED ORDER — METOPROLOL SUCCINATE ER 25 MG PO TB24
25.0000 mg | ORAL_TABLET | Freq: Every day | ORAL | Status: DC
  Administered 2012-12-15 – 2012-12-16 (×2): 25 mg via ORAL
  Filled 2012-12-15 (×2): qty 1

## 2012-12-15 MED ORDER — FUROSEMIDE 10 MG/ML IJ SOLN
80.0000 mg | Freq: Two times a day (BID) | INTRAMUSCULAR | Status: DC
  Administered 2012-12-15 – 2012-12-16 (×3): 80 mg via INTRAVENOUS
  Filled 2012-12-15 (×3): qty 8

## 2012-12-15 NOTE — Progress Notes (Signed)
PULMONARY  / CRITICAL CARE MEDICINE  Name: Deborah [name] MRN: [id number] DOB: 11/05/1950    ADMISSION DATE:  12/12/2012 CONSULTATION DATE:  12/14/02  REFERRING MD :  Dr. Lyda Perone PRIMARY SERVICE: [facility]  CHIEF COMPLAINT:  Hypotension  BRIEF PATIENT DESCRIPTION: 62 yo F with rheumatic heart disease s/p MVR and afib admitted with fever and hypotension likely 2/2 urosepsis.  SIGNIFICANT EVENTS / STUDIES:  4/18 Admitted 4/19 Hypotension  LINES / TUBES: A-line 4/19>>>   CULTURES: Urine Cx 4/18 >>> Blood Cx 4/18 >>>  ANTIBIOTICS: Vanc 4/19 >>>4/20 Zosyn 4/19 >>>4/20 Rocephin 4/20>>> Zithromax 4/20>>>  HISTORY OF PRESENT ILLNESS:  62 yp F with rheumatic heart disease s/p MVR ([employer])/TVR, chronic diastolic CHF,  atrial fibrillation, HLD, and HTN.  She came to the ED with fever to 102, chills, fatigue, and dysuria.  She has also had some mild chest pressure and cough.  She was admitted and noted to be mildly hypotensive with response to IVF.  Overnight had additional episodes of hypotension and currently getting 4th liter NS since admission.  SUBJECTIVE: feels better and ambulating.  VITAL SIGNS: Temp:  [97.6 F (36.4 C)-98.4 F (36.9 C)] 98.3 F (36.8 C) (04/21 0818) Pulse Rate:  [71-106] 77 (04/21 1018) Resp:  [15-24] 19 (04/21 0818) BP: (103-150)/(60-88) 103/77 mmHg (04/21 1018) SpO2:  [93 %-98 %] 95 % (04/21 0818) Weight:  [74.9 kg (165 lb 2 oz)] 74.9 kg (165 lb 2 oz) (04/21 0042) HEMODYNAMICS:   VENTILATOR SETTINGS:   INTAKE / OUTPUT: Intake/Output     04/20 0701 - 04/21 0700 04/21 0701 - 04/22 0700   P.O. 840 120   I.V. (mL/kg)     IV Piggyback 300    Total Intake(mL/kg) 1140 (15.2) 120 (1.6)   Urine (mL/kg/hr) 1050 (0.6)    Emesis/NG output     Total Output 1050     Net +90 +120        Urine Occurrence 2 x 1 x   Stool Occurrence 1 x     PHYSICAL EXAMINATION: General:  appears well NAD Neuro:  Awake, alert, HEENT:  Sclera clear, MMM,  EOMI Cardiovascular:  Irregular irregular, mechanical heart sounds Lungs:  CTAB Abdomen:  Soft, NT, ND Musculoskeletal:  Joints wnl Skin:  Warm, no rash  LABS:  Recent Labs Lab 12/11/12 0903  12/12/12 1546  12/12/12 1703 12/13/12 0025 12/13/12 0555 12/13/12 0600 12/13/12 0828 12/13/12 1615 12/14/12 0600 12/14/12 0801 12/15/12 0539  HGB  --   < > 12.1  --   --   --  9.7*  --  14.3  --   --  9.7*  --   WBC  --   --  12.7*  --   --   --  8.0  --   --   --   --  8.0  --   PLT  --   --  198  --   --   --  148*  --   --   --   --  185  --   NA 139  --  132*  --   --   --  137  --  142  --   --   --  140  K 4.1  --  3.8  --   --   --  3.6  --  3.3*  --   --   --  4.5  CL 103  --  97  --   --   --  106  --  108  --   --   --  108  CO2 28  --  25  --   --   --  23  --   --   --   --   --  26  GLUCOSE 114*  --  148*  --   --   --  163*  --  159*  --   --   --  150*  BUN 21  --  18  --   --   --  15  --  13  --   --   --  19  CREATININE 0.9  --  1.09  --   --   --  0.94  --  0.80  --   --   --  0.80  CALCIUM 8.8  --  9.1  --   --   --  7.3*  --   --   --   --   --  8.7  INR  --   --   --   < >  --   --  2.55*  --   --   --  3.27*  --  3.96*  LATICACIDVEN  --   --   --   --  1.76  --  1.0  --   --   --   --   --   --   TROPONINI  --   --   --   --   --  <0.30 <0.30  --   --  <0.30  --   --   --   PROBNP 96.0  --  1611.0*  --   --   --   --  1094.0*  --   --   --   --   --   < > = values in this interval not displayed.  Recent Labs Lab 12/13/12 0634  GLUCAP 147*   CXR 4/19:  IMPRESSION:  1. Mildly worsening left basilar airspace opacification raises  concern for pneumonia, though mild asymmetric interstitial edema  might have a similar appearance. Small left pleural effusion seen.  Findings appear superimposed on the patient's chronic lung changes.  2. Mild cardiomegaly.  ASSESSMENT / PLAN:  PULMONARY  A: ?LLL opac- infiltate, Atx, Effusion P:  - O2 by [place] to keep  sats>90%. - Solumedrol changed to prednisone 20 mg PO daily. - Rochephin, Zithromax IV. - IS per RT protocol and early ambulation.  CARDIOVASCULAR A: Hypotension - likely 2/2 urosepsis with underlying heart disease now stable. P:  - Appears to be perfusing well with low lactate, UOP and good mentation - Given 4L NS intitially now KVO. - Continue metoprolol and lasix. - Cardiology following  HEMATOLOGIC A:  Anticoagulation in setting of MVR P:  - Continue coumadin  INFECTIOUS A:  Sepsis likely urosepsis P:   - On Zithromax, Rocephin per [facility] - Cultures are all negative.  Would d/c zithromax and change rocephin to augmentin PO and finish an eight day course.  PCCM will sign off, please call back if needed.  Alyson Reedy, M.D. [facility] Pulmonary/Critical Care Medicine. Pager: [phone]. After hours pager: [phone].

## 2012-12-15 NOTE — Progress Notes (Signed)
Utilization Review Completed.   Yonis Carreon, RN, BSN Nurse Case Manager  [phone]  

## 2012-12-15 NOTE — Progress Notes (Signed)
Pt arrived from 2600 per w/c. Oriented to room and need for fluid restriction and need for I &n O with regards to urine collection. Placed on tele monitor and VS done

## 2012-12-15 NOTE — Progress Notes (Signed)
Patient ID: Deborah [name], female   DOB: June 27, 1951, 62 y.o.   MRN: [id number]    SUBJECTIVE: Stable today, still heaviness in chest when she breathes.  BP better.    Marland Kitchen aspirin EC  81 mg Oral Daily  . atorvastatin  10 mg Oral q1800  . azithromycin  500 mg Intravenous Q24H  . cefTRIAXone (ROCEPHIN)  IV  1 g Intravenous Q24H  . folic acid  1 mg Oral Daily  . furosemide  80 mg Intravenous Q12H  . loratadine  10 mg Oral Daily  . metoprolol succinate  25 mg Oral Daily  . pantoprazole  40 mg Oral Daily  . [START ON 12/16/2012] predniSONE  20 mg Oral Q breakfast  . sodium chloride  3 mL Intravenous Q12H  . Warfarin - Pharmacist Dosing Inpatient   Does not apply q1800      Filed Vitals:   12/15/12 0042 12/15/12 0300 12/15/12 0449 12/15/12 0818  BP: 115/67 116/64 123/88 125/85  Pulse: 84 88 78 76  Temp: 97.6 F (36.4 C)  97.7 F (36.5 C) 98.3 F (36.8 C)  TempSrc: Oral  Oral Axillary  Resp: 22 22 17 19   Height:      Weight: 165 lb 2 oz (74.9 kg)     SpO2: 96% 94% 96% 95%    Intake/Output Summary (Last 24 hours) at 12/15/12 0823 Last data filed at 12/15/12 0300  Gross per 24 hour  Intake   1140 ml  Output   1050 ml  Net     90 ml    LABS: Basic Metabolic Panel:  Recent Labs  78/29/56 0555 12/13/12 0828 12/15/12 0539  NA 137 142 140  K 3.6 3.3* 4.5  CL 106 108 108  CO2 23  --  26  GLUCOSE 163* 159* 150*  BUN 15 13 19   CREATININE 0.94 0.80 0.80  CALCIUM 7.3*  --  8.7   Liver Function Tests: No results found for this basename: AST, ALT, ALKPHOS, BILITOT, PROT, ALBUMIN,  in the last 72 hours No results found for this basename: LIPASE, AMYLASE,  in the last 72 hours CBC:  Recent Labs  12/12/12 1546 12/13/12 0555 12/13/12 0828 12/14/12 0801  WBC 12.7* 8.0  --  8.0  NEUTROABS 10.6*  --   --   --   HGB 12.1 9.7* 14.3 9.7*  HCT 35.9* 29.4* 42.0 28.5*  MCV 70.3* 70.5*  --  69.5*  PLT 198 148*  --  185   Cardiac Enzymes:  Recent Labs  12/13/12 0025  12/13/12 0555 12/13/12 1615  TROPONINI <0.30 <0.30 <0.30   BNP: No components found with this basename: POCBNP,  D-Dimer: No results found for this basename: DDIMER,  in the last 72 hours Hemoglobin A1C: No results found for this basename: HGBA1C,  in the last 72 hours Fasting Lipid Panel: No results found for this basename: CHOL, HDL, LDLCALC, TRIG, CHOLHDL, LDLDIRECT,  in the last 72 hours Thyroid Function Tests: No results found for this basename: TSH, T4TOTAL, FREET3, T3FREE, THYROIDAB,  in the last 72 hours Anemia Panel: No results found for this basename: VITAMINB12, FOLATE, FERRITIN, TIBC, IRON, RETICCTPCT,  in the last 72 hours  RADIOLOGY: Dg Chest 2 View  12/12/2012  *RADIOLOGY REPORT*  Clinical Data: Chest pain.  CHEST - 2 VIEW  Comparison: 10/02/2011.  Findings: The heart is enlarged but stable.  There are surgical changes from bypass surgery.  The lungs are clear except for chronic left basilar scarring and mild pleural  thickening.  No edema or pneumothorax.  The bony thorax is intact.  IMPRESSION: Chronic left basilar scarring changes. Stable cardiac enlargement. No infiltrates or effusions.   Original Report Authenticated By: Rudie Meyer, M.D.    Dg Chest Port 1 View  12/13/2012  *RADIOLOGY REPORT*  Clinical Data: Dizziness when supine; shortness of breath. Evaluate for pulmonary edema.  PORTABLE CHEST - 1 VIEW  Comparison: Chest radiograph performed 12/12/2012  Findings: The lungs are relatively well expanded.  Mildly worsening left basilar airspace opacification raises concern for pneumonia, though mild asymmetric interstitial edema might have a similar appearance.  A small left pleural effusion is seen.  This appears superimposed on the patient's chronic lung changes.  No pneumothorax is seen.  The cardiomediastinal silhouette is mildly enlarged.  The patient is status post median sternotomy.  No acute osseous abnormalities are seen.  IMPRESSION:  1.  Mildly worsening left  basilar airspace opacification raises concern for pneumonia, though mild asymmetric interstitial edema might have a similar appearance.  Small left pleural effusion seen. Findings appear superimposed on the patient's chronic lung changes. 2.  Mild cardiomegaly.   Original Report Authenticated By: Tonia Ghent, M.D.    Dg Hand Complete Left  11/17/2012  *RADIOLOGY REPORT*  Clinical Data: Left hand pain, most prominent along the left fifth digit.  No known injury.  LEFT HAND - COMPLETE 3+ VIEW  Comparison: Right hand radiographs 11/17/2012  Findings: Normal bony mineralization and alignment.  There is joint space loss and osteophyte formation with prominent dorsal spurring spurring at the distal interphalangeal joint of the fifth digit. There is slight soft tissue prominence about the DIP joint of the fifth digit.  Mild / early degenerative changes are seen in the distal interphalangeal joints of the second through fourth digits. No bony erosions or fracture is identified.  IMPRESSION:  1.  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 2.  Very early degenerative changes of the distal interphalangeal joints of the second through fourth fingers.   Original Report Authenticated By: Britta Mccreedy, M.D.    Dg Hand Complete Right  11/17/2012  *RADIOLOGY REPORT*  Clinical Data: Right hand pain for 2-3 months.  RIGHT HAND - COMPLETE 3+ VIEW  Comparison: None.  Findings: Three views of the right hand were obtained.  There is normal alignment.  No evidence for acute fracture or dislocation. No significant soft tissue swelling.  No significant joint space narrowing or degenerative changes.  IMPRESSION: No acute bony abnormality.   Original Report Authenticated By: Richarda Overlie, M.D.     PHYSICAL EXAM General: NAD Neck: JVP 12 cm, no thyromegaly or thyroid nodule.  Lungs: Slight crackles at bases bilaterally.  CV: Nondisplaced PMI.  Heart regular S1/S2, no S3/S4, 2/6 systolic murmur  LLSB.  No peripheral edema.  No carotid bruit.  Normal pedal pulses.  Abdomen: Soft, nontender, no hepatosplenomegaly, no distention.  Neurologic: Alert and oriented x 3.  Psych: Normal affect. Extremities: No clubbing or cyanosis.   TELEMETRY: Reviewed telemetry pt in NSR  ASSESSMENT AND PLAN: 62 yo with history of mechanical MV replacement/tricuspid valve repair with moderate to severe TR on last echo and diastolic CHF.  Also with chronic atrial fibrillation.  She presented with suspected urosepsis/hypotension but now has CXR concerning for LLL PNA..  1. ID: BP better this morning. UTI on UA but also with concern for LLL PNA on CXR.  Continue ceftriaxone/azithro, cultures negative to date.  2. Mechanical MVR: Coumadin with INR goal  2.5-3.5, ASA 81.  Hemoglobin lower than baseline in hospital, will need to watch closely.  3. Diastolic CHF: JVP elevated but also has chronic significant TR.  I do suspect that she is volume overloaded.  Agree with beginning IV Lasix today. Follow I/Os closely.  4. Chronic atrial fibrillation: Stable rate. Restart Toprol XL at 25 daily today, increase to 50 tomorrow if she tolerates.   Marca Ancona 12/15/2012 8:23 AM

## 2012-12-15 NOTE — Progress Notes (Signed)
[facility] PROGRESS NOTE  Assessment/Plan: PNA/SIRS: - Start rocephin and Azithro 4.19.2014. Change to Avelox on 4.22.2014 - Start steroids due to wheezing,  - Blood culture and urine culture negative till date. - Bp improving.   Hypotension - Resolved. - Strict I and O's - PT/INR unchanged.   Diastolic CHF, chronic - Start lasix and restart metoprolol, strict I and O's. - CXR: worsening Left basilar opacity. - will consider start ACE-I 4.22.2014 if Bp can tolerate it.  Multiple Joint pain/GOUT: - Steroids should help.  Chronic anticoagulation - Therapeutic  Chronic atrial fibrillation: - rate control. - INR thx.    Code Status: Full Code Family Communication: Spoke with family at bedside  Disposition Plan: SDU.   Consultants:  none  Procedures: CXR : Mildly worsening left basilar airspace opacification raises  concern for pneumonia, though mild asymmetric interstitial edema  might have a similar appearance    Antibiotics:  Rocephin  HPI/Subjective: Feels SOB is better. Did not sleep well.  Objective: Filed Vitals:   12/14/12 2300 12/15/12 0042 12/15/12 0300 12/15/12 0449  BP: 127/60 115/67 116/64 123/88  Pulse: 75 84 88 78  Temp:  97.6 F (36.4 C)  97.7 F (36.5 C)  TempSrc:  Oral  Oral  Resp: 22 22 22 17   Height:      Weight:  74.9 kg (165 lb 2 oz)    SpO2: 95% 96% 94% 96%    Intake/Output Summary (Last 24 hours) at 12/15/12 0735 Last data filed at 12/15/12 0300  Gross per 24 hour  Intake   1140 ml  Output   1050 ml  Net     90 ml   Filed Weights   12/12/12 1528 12/15/12 0042  Weight: 72.122 kg (159 lb) 74.9 kg (165 lb 2 oz)    Exam:  General: Alert, awake, oriented x3, in no acute distress.  HEENT: No bruits, no goiter. Mild JVD Heart: Regular rate and rhythm, without murmurs, rubs, gallops.  Lungs: Good air movement,  crackle son the left side. Abdomen: Soft, nontender, nondistended, positive bowel sounds.  Neuro:  Grossly intact, nonfocal.   Data Reviewed: Basic Metabolic Panel:  Recent Labs Lab 12/11/12 0903 12/12/12 1546 12/13/12 0555 12/13/12 0828 12/15/12 0539  NA 139 132* 137 142 140  K 4.1 3.8 3.6 3.3* 4.5  CL 103 97 106 108 108  CO2 28 25 23   --  26  GLUCOSE 114* 148* 163* 159* 150*  BUN 21 18 15 13 19   CREATININE 0.9 1.09 0.94 0.80 0.80  CALCIUM 8.8 9.1 7.3*  --  8.7   Liver Function Tests: No results found for this basename: AST, ALT, ALKPHOS, BILITOT, PROT, ALBUMIN,  in the last 168 hours No results found for this basename: LIPASE, AMYLASE,  in the last 168 hours No results found for this basename: AMMONIA,  in the last 168 hours CBC:  Recent Labs Lab 12/12/12 1546 12/13/12 0555 12/13/12 0828 12/14/12 0801  WBC 12.7* 8.0  --  8.0  NEUTROABS 10.6*  --   --   --   HGB 12.1 9.7* 14.3 9.7*  HCT 35.9* 29.4* 42.0 28.5*  MCV 70.3* 70.5*  --  69.5*  PLT 198 148*  --  185   Cardiac Enzymes:  Recent Labs Lab 12/13/12 0025 12/13/12 0555 12/13/12 1615  TROPONINI <0.30 <0.30 <0.30   BNP (last 3 results)  Recent Labs  12/11/12 0903 12/12/12 1546 12/13/12 0600  PROBNP 96.0 1611.0* 1094.0*   CBG:  Recent Labs  Lab 12/13/12 0634  GLUCAP 147*    Recent Results (from the past 240 hour(s))  URINE CULTURE     Status: None   Collection Time    12/12/12  5:32 PM      Result Value Range Status   Specimen Description URINE, CLEAN CATCH   Final   Special Requests NONE   Final   Culture  Setup Time 12/12/2012 18:28   Final   Colony Count 35,000 COLONIES/ML   Final   Culture     Final   Value: Multiple bacterial morphotypes present, none predominant. Suggest appropriate recollection if clinically indicated.   Report Status 12/14/2012 FINAL   Final  CULTURE, BLOOD (ROUTINE X 2)     Status: None   Collection Time    12/12/12  7:35 PM      Result Value Range Status   Specimen Description BLOOD HAND LEFT   Final   Special Requests BOTTLES DRAWN AEROBIC ONLY 5CC    Final   Culture  Setup Time 12/13/2012 01:09   Final   Culture     Final   Value:        BLOOD CULTURE RECEIVED NO GROWTH TO DATE CULTURE WILL BE HELD FOR 5 DAYS BEFORE ISSUING A FINAL NEGATIVE REPORT   Report Status PENDING   Incomplete  CULTURE, BLOOD (ROUTINE X 2)     Status: None   Collection Time    12/12/12  7:46 PM      Result Value Range Status   Specimen Description BLOOD HAND LEFT   Final   Special Requests BOTTLES DRAWN AEROBIC ONLY 3CC   Final   Culture  Setup Time 12/13/2012 01:09   Final   Culture     Final   Value:        BLOOD CULTURE RECEIVED NO GROWTH TO DATE CULTURE WILL BE HELD FOR 5 DAYS BEFORE ISSUING A FINAL NEGATIVE REPORT   Report Status PENDING   Incomplete  MRSA PCR SCREENING     Status: None   Collection Time    12/12/12 11:08 PM      Result Value Range Status   MRSA by PCR NEGATIVE  NEGATIVE Final   Comment:            The GeneXpert MRSA Assay (FDA     approved for NASAL specimens     only), is one component of a     comprehensive MRSA colonization     surveillance program. It is not     intended to diagnose MRSA     infection nor to guide or     monitor treatment for     MRSA infections.     Studies: No results found.  Scheduled Meds: . aspirin EC  81 mg Oral Daily  . atorvastatin  10 mg Oral q1800  . azithromycin  500 mg Intravenous Q24H  . cefTRIAXone (ROCEPHIN)  IV  1 g Intravenous Q24H  . folic acid  1 mg Oral Daily  . loratadine  10 mg Oral Daily  . methylPREDNISolone (SOLU-MEDROL) injection  60 mg Intravenous Q12H  . metoprolol tartrate  12.5 mg Oral BID  . pantoprazole  40 mg Oral Daily  . sodium chloride  3 mL Intravenous Q12H  . Warfarin - Pharmacist Dosing Inpatient   Does not apply q1800   Continuous Infusions:     Marinda Elk  [facility] Pager [phone]. If 8PM-8AM, please contact night-coverage at [email or web address], password [facility] 12/15/2012, 7:35 AM  LOS: 3 days

## 2012-12-15 NOTE — Progress Notes (Signed)
ANTICOAGULATION CONSULT NOTE - Follow Up Consult  Pharmacy Consult for coumadin Indication: Mechanical valve, afib   Allergies  Allergen Reactions  . Promethazine Hcl     REACTION: made her real shaky  . Promethazine Hcl     Patient Measurements: Height: 4\' 11"  (149.9 cm) Weight: 165 lb 2 oz (74.9 kg) IBW/kg (Calculated) : 43.2   Vital Signs: Temp: 98.3 F (36.8 C) (04/21 0818) Temp src: Axillary (04/21 0818) BP: 125/85 mmHg (04/21 0818) Pulse Rate: 76 (04/21 0818)  Labs:  Recent Labs  12/12/12 1546  12/13/12 0025 12/13/12 0555 12/13/12 0828 12/13/12 1615 12/14/12 0600 12/14/12 0801 12/15/12 0539  HGB 12.1  --   --  9.7* 14.3  --   --  9.7*  --   HCT 35.9*  --   --  29.4* 42.0  --   --  28.5*  --   PLT 198  --   --  148*  --   --   --  185  --   LABPROT  --   < >  --  26.2*  --   --  31.5*  --  36.3*  INR  --   < >  --  2.55*  --   --  3.27*  --  3.96*  CREATININE 1.09  --   --  0.94 0.80  --   --   --  0.80  TROPONINI  --   --  <0.30 <0.30  --  <0.30  --   --   --   < > = values in this interval not displayed.  Estimated Creatinine Clearance: 66 ml/min (by C-G formula based on Cr of 0.8).  Assessment: Patient is a 62 y.o F on coumadin for mrcanhical MVR and chronic afib.  INR increased sharply from 3.27 to 3.96 today. No bleeding noted.  Goal of Therapy:  INR 2.5-3.5    Plan:  1) Hold coumadin dose today  Daxten Kovalenko P 12/15/2012,8:48 AM

## 2012-12-16 LAB — BASIC METABOLIC PANEL
CO2: 30 mEq/L (ref 19–32)
Chloride: 102 mEq/L (ref 96–112)
Creatinine, Ser: 0.89 mg/dL (ref 0.50–1.10)
GFR calc Af Amer: 80 mL/min — ABNORMAL LOW (ref >90)
Potassium: 3.6 mEq/L (ref 3.5–5.1)

## 2012-12-16 LAB — PROTIME-INR
INR: 3.58 — ABNORMAL HIGH (ref 0.00–1.49)
Prothrombin Time: 33.7 seconds — ABNORMAL HIGH (ref 11.6–15.2)

## 2012-12-16 MED ORDER — PREDNISONE 10 MG PO TABS: ORAL_TABLET | ORAL | Status: DC

## 2012-12-16 MED ORDER — LEVOFLOXACIN 750 MG PO TABS: 750.0000 mg | ORAL_TABLET | Freq: Every day | ORAL | Status: DC

## 2012-12-16 MED ORDER — WARFARIN SODIUM 1 MG PO TABS
1.0000 mg | ORAL_TABLET | Freq: Once | ORAL | Status: AC
  Administered 2012-12-16: 1 mg via ORAL
  Filled 2012-12-16: qty 1

## 2012-12-16 MED ORDER — LEVOFLOXACIN 750 MG PO TABS
750.0000 mg | ORAL_TABLET | Freq: Every day | ORAL | Status: DC
  Administered 2012-12-16: 750 mg via ORAL
  Filled 2012-12-16: qty 1

## 2012-12-16 MED ORDER — POTASSIUM CHLORIDE CRYS ER 20 MEQ PO TBCR
40.0000 meq | EXTENDED_RELEASE_TABLET | Freq: Two times a day (BID) | ORAL | Status: DC
  Administered 2012-12-16: 40 meq via ORAL
  Filled 2012-12-16: qty 2

## 2012-12-16 NOTE — Progress Notes (Signed)
ANTICOAGULATION CONSULT NOTE - Follow Up Consult  Pharmacy Consult for Coumadin Indication: Mechanical MV/AVR, atrial fibrillation   Dosing Weight: 69 kg  Recent Labs  12/14/12 0600 12/14/12 0801 12/15/12 0539 12/16/12 0450  HGB  --  9.7*  --   --   HCT  --  28.5*  --   --   PLT  --  185  --   --   INR 3.27*  --  3.96* 3.58*  CREATININE  --   --  0.80 0.89   Estimated Creatinine Clearance: 57 ml/min (by C-G formula based on Cr of 0.89).  Medications:  Scheduled:  . aspirin EC  81 mg Oral Daily  . atorvastatin  10 mg Oral q1800  . folic acid  1 mg Oral Daily  . furosemide  80 mg Intravenous Q12H  . levofloxacin  750 mg Oral Daily  . loratadine  10 mg Oral Daily  . metoprolol succinate  25 mg Oral Daily  . pantoprazole  40 mg Oral Daily  . potassium chloride  40 mEq Oral BID  . predniSONE  20 mg Oral Q breakfast  . sodium chloride  3 mL Intravenous Q12H  . Warfarin - Pharmacist Dosing Inpatient   Does not apply q1800  . [DISCONTINUED] azithromycin  500 mg Intravenous Q24H  . [DISCONTINUED] cefTRIAXone (ROCEPHIN)  IV  1 g Intravenous Q24H   Anti-infectives   Start     Dose/Rate Route Frequency Ordered Stop   12/16/12 1000  levofloxacin (LEVAQUIN) tablet 750 mg     750 mg Oral Daily 12/16/12 0747       Assessment:  62 y/o female on chronic Coumadin for mechanical MV/AVR and chronic atrial fibrillation.  INR down after holding Coumadin yesterday.  No bleeding complications noted   Goal of Therapy:  INR 2.5 - 3.5   Plan:   Coumadin 1 mg po today.  Daily INR's, CBC.   Monitor for bleeding complications   Laurena Bering, Pharm.D.  12/16/2012,1:52 PM

## 2012-12-16 NOTE — Progress Notes (Signed)
DC IV, DC Tele, DC Home. Discharge instructions and home medications discussed with patient and patient's family members. Patient and family denied any questions or concerns at that time. Patient leaving unit via wheelchair and appears in no acute distress.

## 2012-12-16 NOTE — Progress Notes (Signed)
Patient ID: Deborah [name], female   DOB: [birth year], 62 y.o.   MRN: [id number]    SUBJECTIVE: No dyspnea, feels better.    Marland Kitchen aspirin EC  81 mg Oral Daily  . atorvastatin  10 mg Oral q1800  . folic acid  1 mg Oral Daily  . furosemide  80 mg Intravenous Q12H  . levofloxacin  750 mg Oral Daily  . loratadine  10 mg Oral Daily  . metoprolol succinate  25 mg Oral Daily  . pantoprazole  40 mg Oral Daily  . potassium chloride  40 mEq Oral BID  . predniSONE  20 mg Oral Q breakfast  . sodium chloride  3 mL Intravenous Q12H  . Warfarin - Pharmacist Dosing Inpatient   Does not apply q1800      Filed Vitals:   12/15/12 1159 12/15/12 1900 12/15/12 2035 12/16/12 0502  BP: 128/81 117/77 116/74 102/70  Pulse: 70 72 84 68  Temp: 97.7 F (36.5 C)  98.4 F (36.9 C) 97.6 F (36.4 C)  TempSrc: Oral  Oral Oral  Resp: 18 18 18 18   Height: 4\' 11"  (1.499 m)     Weight: 160 lb 7.9 oz (72.8 kg)   153 lb 1.6 oz (69.446 kg)  SpO2: 95% 97% 94% 95%    Intake/Output Summary (Last 24 hours) at 12/16/12 0801 Last data filed at 12/16/12 0757  Gross per 24 hour  Intake   1213 ml  Output   3200 ml  Net  -1987 ml    LABS: Basic Metabolic Panel:  Recent Labs  40/98/11 0539 12/16/12 0450  NA 140 141  K 4.5 3.6  CL 108 102  CO2 26 30  GLUCOSE 150* 154*  BUN 19 24*  CREATININE 0.80 0.89  CALCIUM 8.7 9.0   Liver Function Tests: No results found for this basename: AST, ALT, ALKPHOS, BILITOT, PROT, ALBUMIN,  in the last 72 hours No results found for this basename: LIPASE, AMYLASE,  in the last 72 hours CBC:  Recent Labs  12/13/12 0828 12/14/12 0801  WBC  --  8.0  HGB 14.3 9.7*  HCT 42.0 28.5*  MCV  --  69.5*  PLT  --  185   Cardiac Enzymes:  Recent Labs  12/13/12 1615  TROPONINI <0.30   BNP: No components found with this basename: POCBNP,  D-Dimer: No results found for this basename: DDIMER,  in the last 72 hours Hemoglobin A1C: No results found for this basename: HGBA1C,   in the last 72 hours Fasting Lipid Panel: No results found for this basename: CHOL, HDL, LDLCALC, TRIG, CHOLHDL, LDLDIRECT,  in the last 72 hours Thyroid Function Tests: No results found for this basename: TSH, T4TOTAL, FREET3, T3FREE, THYROIDAB,  in the last 72 hours Anemia Panel: No results found for this basename: VITAMINB12, FOLATE, FERRITIN, TIBC, IRON, RETICCTPCT,  in the last 72 hours  RADIOLOGY: Dg Chest 2 View  12/12/2012  *RADIOLOGY REPORT*  Clinical Data: Chest pain.  CHEST - 2 VIEW  Comparison: 10/02/2011.  Findings: The heart is enlarged but stable.  There are surgical changes from bypass surgery.  The lungs are clear except for chronic left basilar scarring and mild pleural thickening.  No edema or pneumothorax.  The bony thorax is intact.  IMPRESSION: Chronic left basilar scarring changes. Stable cardiac enlargement. No infiltrates or effusions.   Original Report Authenticated By: Rudie Meyer, M.D.    Dg Chest Port 1 View  12/13/2012  *RADIOLOGY REPORT*  Clinical Data: Dizziness when supine;  shortness of breath. Evaluate for pulmonary edema.  PORTABLE CHEST - 1 VIEW  Comparison: Chest radiograph performed 12/12/2012  Findings: The lungs are relatively well expanded.  Mildly worsening left basilar airspace opacification raises concern for pneumonia, though mild asymmetric interstitial edema might have a similar appearance.  A small left pleural effusion is seen.  This appears superimposed on the patient's chronic lung changes.  No pneumothorax is seen.  The cardiomediastinal silhouette is mildly enlarged.  The patient is status post median sternotomy.  No acute osseous abnormalities are seen.  IMPRESSION:  1.  Mildly worsening left basilar airspace opacification raises concern for pneumonia, though mild asymmetric interstitial edema might have a similar appearance.  Small left pleural effusion seen. Findings appear superimposed on the patient's chronic lung changes. 2.  Mild cardiomegaly.    Original Report Authenticated By: Tonia Ghent, M.D.    Dg Hand Complete Left  11/17/2012  *RADIOLOGY REPORT*  Clinical Data: Left hand pain, most prominent along the left fifth digit.  No known injury.  LEFT HAND - COMPLETE 3+ VIEW  Comparison: Right hand radiographs 11/17/2012  Findings: Normal bony mineralization and alignment.  There is joint space loss and osteophyte formation with prominent dorsal spurring spurring at the distal interphalangeal joint of the fifth digit. There is slight soft tissue prominence about the DIP joint of the fifth digit.  Mild / early degenerative changes are seen in the distal interphalangeal joints of the second through fourth digits. No bony erosions or fracture is identified.  IMPRESSION:  1.  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 2.  Very early degenerative changes of the distal interphalangeal joints of the second through fourth fingers.   Original Report Authenticated By: Britta Mccreedy, M.D.    Dg Hand Complete Right  11/17/2012  *RADIOLOGY REPORT*  Clinical Data: Right hand pain for 2-3 months.  RIGHT HAND - COMPLETE 3+ VIEW  Comparison: None.  Findings: Three views of the right hand were obtained.  There is normal alignment.  No evidence for acute fracture or dislocation. No significant soft tissue swelling.  No significant joint space narrowing or degenerative changes.  IMPRESSION: No acute bony abnormality.   Original Report Authenticated By: Richarda Overlie, M.D.     PHYSICAL EXAM General: NAD Neck: JVP 8 cm, no thyromegaly or thyroid nodule.  Lungs: Slight crackles at bases bilaterally.  CV: Nondisplaced PMI.  Heart regular S1/S2, no S3/S4, 2/6 systolic murmur LLSB.  No peripheral edema.  No carotid bruit.  Normal pedal pulses.  Abdomen: Soft, nontender, no hepatosplenomegaly, no distention.  Neurologic: Alert and oriented x 3.  Psych: Normal affect. Extremities: No clubbing or cyanosis.   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  Also with chronic atrial fibrillation.  She presented with suspected urosepsis/hypotension and ? LLL PNA..  1. ID: Doing better, on antibiotics for UTI, ? PNA.    2. Mechanical MVR: Coumadin with INR goal 2.5-3.5, ASA 81.  Hemoglobin lower than baseline in hospital, will need to watch closely.  3. Diastolic CHF: JVP lower with diuresis, breathing better.   4. Chronic atrial fibrillation: Stable rate.  5. Disposition: I think she could go home today.  I would like to see her back in a week and she will need coumadin followup.  Would send her home on her prior meds except use  Lasix 80 mg po bid until I see her again.  Keep her on her prior to hospital KCl regimen as well.   Marca Ancona 12/16/2012 8:01 AM

## 2012-12-16 NOTE — Discharge Summary (Signed)
Physician Discharge Summary  Deborah [name] ZOX:[id number] DOB: 05/23/1951 DOA: 12/12/2012  PCP: Julieanne Manson, MD  Admit date: 12/12/2012 Discharge date: 12/16/2012  Time spent: 35 minutes  Recommendations for Outpatient Follow-up:  1. Follow up with cardiology as an outpatient, moniotr her weight check a b-met.  Discharge Diagnoses:  Principal Problem:   CAP (community acquired pneumonia) Active Problems:   Chronic atrial fibrillation   Chronic anticoagulation   Diastolic CHF, chronic   Hypotension   SIRS (systemic inflammatory response syndrome)  Filed Weights   12/15/12 0042 12/15/12 1159 12/16/12 0502  Weight: 74.9 kg (165 lb 2 oz) 72.8 kg (160 lb 7.9 oz) 69.446 kg (153 lb 1.6 oz)     Discharge Condition: stable  Diet recommendation: heart healthy  Filed Weights   12/15/12 0042 12/15/12 1159 12/16/12 0502  Weight: 74.9 kg (165 lb 2 oz) 72.8 kg (160 lb 7.9 oz) 69.446 kg (153 lb 1.6 oz)    History of present illness:  62 y.o. female pmh rheumatic heart disease, CHF NYHA class 3-4, A.fib, mechanical heart valve. Presents to ED after developing fever, chills, and dysuria over night. Patient came to ED for symptoms. In ED patient was indeed found to have a UTI and be slightly hypotensive but responsive to fluids. Hospitalist has been asked to admit.   Hospital Course:  PNA/SIRS:  - Start rocephin and Azithro 4.19.2014. Change to Avelox on 4.22.2014. - CXR: worsening Left basilar opacity.  - was mildly hypotensive on admission and was given IV fluids to sustain Blood pressure. - Start steroids due to wheezing. - Blood culture and urine culture negative till date.  - transition to levaquin which she tolerate it.  Hypotension  - Resolved with IV fluids,  Due to PNA. - Strict I and O's  - PT/INR unchanged.   Diastolic CHF, chronic : - Initially lasix was held. Once her Bp stabilize. Cardiology consulted. - Start lasix and restart metoprolol, strict I and O's.  Diurese well d/c weight 69.4 kg - Start ACE-I 4.22.2014, Bp tolerate it. - Beta blocker, resume at her home dose.  Multiple Joint pain/GOUT:  - was complaining of multiple joint pain. - Steroids should help.   Chronic anticoagulation  - Therapeutic .  Chronic atrial fibrillation:  - rate control.  - INR thx.   Procedures:  CXR  Consultations:  Cardiology  PCCM  Discharge Exam: Filed Vitals:   12/15/12 1159 12/15/12 1900 12/15/12 2035 12/16/12 0502  BP: 128/81 117/77 116/74 102/70  Pulse: 70 72 84 68  Temp: 97.7 F (36.5 C)  98.4 F (36.9 C) 97.6 F (36.4 C)  TempSrc: Oral  Oral Oral  Resp: 18 18 18 18   Height: 4\' 11"  (1.499 m)     Weight: 72.8 kg (160 lb 7.9 oz)   69.446 kg (153 lb 1.6 oz)  SpO2: 95% 97% 94% 95%    General: A&O x3 Cardiovascular: RRR Respiratory: good air movement CTA B.L  Discharge Instructions   Future Appointments Provider Department Dept Phone   01/07/2013 10:00 AM [facility] [phone]   01/28/2013 10:30 AM Laurey Morale, MD [facility] Main Office [place]) [phone]       Medication List    TAKE these medications       acetaminophen 325 MG tablet  Commonly known as:  TYLENOL  Take 650 mg by mouth every 6 (six) hours as needed for pain.     allopurinol 300 MG tablet  Commonly known as:  ZYLOPRIM  Take 300 mg by mouth daily.     aspirin EC 81 MG tablet  Take 81 mg by mouth daily.     benzonatate 100 MG capsule  Commonly known as:  TESSALON  Take 100 mg by mouth 3 (three) times daily as needed for cough.     chlorpheniramine-HYDROcodone 10-8 MG/5ML Lqcr  Commonly known as:  TUSSIONEX  Take 5 mLs by mouth every 12 (twelve) hours as needed (cough).     folic acid 1 MG tablet  Commonly known as:  FOLVITE  Take 1 mg by mouth daily.     furosemide 80 MG tablet  Commonly known as:  LASIX  Take 80 mg by mouth 2 (two) times daily.     hydrOXYzine 100 MG capsule   Commonly known as:  VISTARIL  Take 100 mg by mouth 3 (three) times daily as needed for itching.     levofloxacin 750 MG tablet  Commonly known as:  LEVAQUIN  Take 1 tablet (750 mg total) by mouth daily.     loratadine 10 MG tablet  Commonly known as:  CLARITIN  Take 10 mg by mouth daily.     metoprolol succinate 50 MG 24 hr tablet  Commonly known as:  TOPROL-XL  Take 50 mg by mouth daily. Take with or immediately following a meal.     omeprazole 20 MG capsule  Commonly known as:  PRILOSEC  Take 20 mg by mouth daily before breakfast.     potassium chloride SA 20 MEQ tablet  Commonly known as:  K-DUR,KLOR-CON  Take 20-40 mEq by mouth 2 (two) times daily. 2 tabs in the am, 1 tab in the pm     predniSONE 10 MG tablet  Commonly known as:  DELTASONE  Takes  2 tabs for 1 days, then 1 tab for 1 days, and then stop.     rosuvastatin 5 MG tablet  Commonly known as:  CRESTOR  Take 5 mg by mouth every other day.     warfarin 2 MG tablet  Commonly known as:  COUMADIN  Take 1-2 mg by mouth every evening. Monday only-0.5 (1 mg total) half tab; All other days 1 tab (2 mg total)           Follow-up Information   Follow up with Marca Ancona, MD In 3 weeks. (hospital follow up)    Contact information:   [address] [place] [address] [phone]        The results of significant diagnostics from this hospitalization (including imaging, microbiology, ancillary and laboratory) are listed below for reference.    Significant Diagnostic Studies: Dg Chest 2 View  12/12/2012  *RADIOLOGY REPORT*  Clinical Data: Chest pain.  CHEST - 2 VIEW  Comparison: 10/02/2011.  Findings: The heart is enlarged but stable.  There are surgical changes from bypass surgery.  The lungs are clear except for chronic left basilar scarring and mild pleural thickening.  No edema or pneumothorax.  The bony thorax is intact.  IMPRESSION: Chronic left basilar scarring  changes. Stable cardiac enlargement. No infiltrates or effusions.   Original Report Authenticated By: Rudie Meyer, M.D.    Dg Chest Port 1 View  12/13/2012  *RADIOLOGY REPORT*  Clinical Data: Dizziness when supine; shortness of breath. Evaluate for pulmonary edema.  PORTABLE CHEST - 1 VIEW  Comparison: Chest radiograph performed 12/12/2012  Findings: The lungs are relatively well expanded.  Mildly worsening left basilar airspace opacification raises concern  for pneumonia, though mild asymmetric interstitial edema might have a similar appearance.  A small left pleural effusion is seen.  This appears superimposed on the patient's chronic lung changes.  No pneumothorax is seen.  The cardiomediastinal silhouette is mildly enlarged.  The patient is status post median sternotomy.  No acute osseous abnormalities are seen.  IMPRESSION:  1.  Mildly worsening left basilar airspace opacification raises concern for pneumonia, though mild asymmetric interstitial edema might have a similar appearance.  Small left pleural effusion seen. Findings appear superimposed on the patient's chronic lung changes. 2.  Mild cardiomegaly.   Original Report Authenticated By: Tonia Ghent, M.D.    Dg Hand Complete Left  11/17/2012  *RADIOLOGY REPORT*  Clinical Data: Left hand pain, most prominent along the left fifth digit.  No known injury.  LEFT HAND - COMPLETE 3+ VIEW  Comparison: Right hand radiographs 11/17/2012  Findings: Normal bony mineralization and alignment.  There is joint space loss and osteophyte formation with prominent dorsal spurring spurring at the distal interphalangeal joint of the fifth digit. There is slight soft tissue prominence about the DIP joint of the fifth digit.  Mild / early degenerative changes are seen in the distal interphalangeal joints of the second through fourth digits. No bony erosions or fracture is identified.  IMPRESSION:  1.  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 2.  Very early degenerative changes of the distal interphalangeal joints of the second through fourth fingers.   Original Report Authenticated By: Britta Mccreedy, M.D.    Dg Hand Complete Right  11/17/2012  *RADIOLOGY REPORT*  Clinical Data: Right hand pain for 2-3 months.  RIGHT HAND - COMPLETE 3+ VIEW  Comparison: None.  Findings: Three views of the right hand were obtained.  There is normal alignment.  No evidence for acute fracture or dislocation. No significant soft tissue swelling.  No significant joint space narrowing or degenerative changes.  IMPRESSION: No acute bony abnormality.   Original Report Authenticated By: Richarda Overlie, M.D.     Microbiology: Recent Results (from the past 240 hour(s))  URINE CULTURE     Status: None   Collection Time    12/12/12  5:32 PM      Result Value Range Status   Specimen Description URINE, CLEAN CATCH   Final   Special Requests NONE   Final   Culture  Setup Time 12/12/2012 18:28   Final   Colony Count 35,000 COLONIES/ML   Final   Culture     Final   Value: Multiple bacterial morphotypes present, none predominant. Suggest appropriate recollection if clinically indicated.   Report Status 12/14/2012 FINAL   Final  CULTURE, BLOOD (ROUTINE X 2)     Status: None   Collection Time    12/12/12  7:35 PM      Result Value Range Status   Specimen Description BLOOD HAND LEFT   Final   Special Requests BOTTLES DRAWN AEROBIC ONLY 5CC   Final   Culture  Setup Time 12/13/2012 01:09   Final   Culture     Final   Value:        BLOOD CULTURE RECEIVED NO GROWTH TO DATE CULTURE WILL BE HELD FOR 5 DAYS BEFORE ISSUING A FINAL NEGATIVE REPORT   Report Status PENDING   Incomplete  CULTURE, BLOOD (ROUTINE X 2)     Status: None   Collection Time    12/12/12  7:46 PM      Result Value  Range Status   Specimen Description BLOOD HAND LEFT   Final   Special Requests BOTTLES DRAWN AEROBIC ONLY 3CC   Final   Culture  Setup Time 12/13/2012 01:09   Final   Culture      Final   Value:        BLOOD CULTURE RECEIVED NO GROWTH TO DATE CULTURE WILL BE HELD FOR 5 DAYS BEFORE ISSUING A FINAL NEGATIVE REPORT   Report Status PENDING   Incomplete  MRSA PCR SCREENING     Status: None   Collection Time    12/12/12 11:08 PM      Result Value Range Status   MRSA by PCR NEGATIVE  NEGATIVE Final   Comment:            The GeneXpert MRSA Assay (FDA     approved for NASAL specimens     only), is one component of a     comprehensive MRSA colonization     surveillance program. It is not     intended to diagnose MRSA     infection nor to guide or     monitor treatment for     MRSA infections.     Labs: Basic Metabolic Panel:  Recent Labs Lab 12/11/12 0903 12/12/12 1546 12/13/12 0555 12/13/12 0828 12/15/12 0539 12/16/12 0450  NA 139 132* 137 142 140 141  K 4.1 3.8 3.6 3.3* 4.5 3.6  CL 103 97 106 108 108 102  CO2 28 25 23   --  26 30  GLUCOSE 114* 148* 163* 159* 150* 154*  BUN 21 18 15 13 19  24*  CREATININE 0.9 1.09 0.94 0.80 0.80 0.89  CALCIUM 8.8 9.1 7.3*  --  8.7 9.0   Liver Function Tests: No results found for this basename: AST, ALT, ALKPHOS, BILITOT, PROT, ALBUMIN,  in the last 168 hours No results found for this basename: LIPASE, AMYLASE,  in the last 168 hours No results found for this basename: AMMONIA,  in the last 168 hours CBC:  Recent Labs Lab 12/12/12 1546 12/13/12 0555 12/13/12 0828 12/14/12 0801  WBC 12.7* 8.0  --  8.0  NEUTROABS 10.6*  --   --   --   HGB 12.1 9.7* 14.3 9.7*  HCT 35.9* 29.4* 42.0 28.5*  MCV 70.3* 70.5*  --  69.5*  PLT 198 148*  --  185   Cardiac Enzymes:  Recent Labs Lab 12/13/12 0025 12/13/12 0555 12/13/12 1615  TROPONINI <0.30 <0.30 <0.30   BNP: BNP (last 3 results)  Recent Labs  12/11/12 0903 12/12/12 1546 12/13/12 0600  PROBNP 96.0 1611.0* 1094.0*   CBG:  Recent Labs Lab 12/13/12 0634  GLUCAP 147*       Signed:  Marinda Elk  [facility] 12/16/2012, 7:52  AM

## 2012-12-18 ENCOUNTER — Ambulatory Visit (INDEPENDENT_AMBULATORY_CARE_PROVIDER_SITE_OTHER): Payer: Medicaid Other | Admitting: *Deleted

## 2012-12-18 DIAGNOSIS — Z954 Presence of other heart-valve replacement: Secondary | ICD-10-CM

## 2012-12-18 DIAGNOSIS — I059 Rheumatic mitral valve disease, unspecified: Secondary | ICD-10-CM

## 2012-12-18 DIAGNOSIS — Z7901 Long term (current) use of anticoagulants: Secondary | ICD-10-CM

## 2012-12-18 LAB — POCT INR: INR: 3.7

## 2012-12-19 LAB — CULTURE, BLOOD (ROUTINE X 2): Culture: NO GROWTH

## 2012-12-23 ENCOUNTER — Ambulatory Visit (INDEPENDENT_AMBULATORY_CARE_PROVIDER_SITE_OTHER): Payer: Medicaid Other | Admitting: Physician Assistant

## 2012-12-23 ENCOUNTER — Telehealth: Payer: Self-pay | Admitting: *Deleted

## 2012-12-23 ENCOUNTER — Ambulatory Visit (INDEPENDENT_AMBULATORY_CARE_PROVIDER_SITE_OTHER): Payer: Medicaid Other

## 2012-12-23 ENCOUNTER — Encounter: Payer: Self-pay | Admitting: Physician Assistant

## 2012-12-23 VITALS — BP 114/68 | HR 76 | Ht 59.0 in | Wt 154.1 lb

## 2012-12-23 DIAGNOSIS — I4891 Unspecified atrial fibrillation: Secondary | ICD-10-CM

## 2012-12-23 DIAGNOSIS — I5032 Chronic diastolic (congestive) heart failure: Secondary | ICD-10-CM

## 2012-12-23 DIAGNOSIS — R0602 Shortness of breath: Secondary | ICD-10-CM

## 2012-12-23 DIAGNOSIS — M2550 Pain in unspecified joint: Secondary | ICD-10-CM

## 2012-12-23 DIAGNOSIS — I059 Rheumatic mitral valve disease, unspecified: Secondary | ICD-10-CM

## 2012-12-23 DIAGNOSIS — Z954 Presence of other heart-valve replacement: Secondary | ICD-10-CM

## 2012-12-23 DIAGNOSIS — Z7901 Long term (current) use of anticoagulants: Secondary | ICD-10-CM

## 2012-12-23 DIAGNOSIS — D649 Anemia, unspecified: Secondary | ICD-10-CM

## 2012-12-23 LAB — SEDIMENTATION RATE: Sed Rate: 24 mm/hr — ABNORMAL HIGH (ref 0–22)

## 2012-12-23 LAB — CBC WITH DIFFERENTIAL/PLATELET
Basophils Relative: 0.4 % (ref 0.0–3.0)
Eosinophils Absolute: 1.5 10*3/uL — ABNORMAL HIGH (ref 0.0–0.7)
Eosinophils Relative: 19 % — ABNORMAL HIGH (ref 0.0–5.0)
Hemoglobin: 11.9 g/dL — ABNORMAL LOW (ref 12.0–15.0)
Lymphocytes Relative: 25 % (ref 12.0–46.0)
Monocytes Relative: 14.4 % — ABNORMAL HIGH (ref 3.0–12.0)
Neutro Abs: 3.2 10*3/uL (ref 1.4–7.7)
Neutrophils Relative %: 41.2 % — ABNORMAL LOW (ref 43.0–77.0)
RBC: 5.06 Mil/uL (ref 3.87–5.11)
WBC: 7.9 10*3/uL (ref 4.5–10.5)

## 2012-12-23 LAB — BASIC METABOLIC PANEL
CO2: 28 mEq/L (ref 19–32)
Calcium: 8.7 mg/dL (ref 8.4–10.5)
Glucose, Bld: 92 mg/dL (ref 70–99)
Potassium: 3.6 mEq/L (ref 3.5–5.1)
Sodium: 138 mEq/L (ref 135–145)

## 2012-12-23 LAB — POCT INR: INR: 2.9

## 2012-12-23 MED ORDER — FUROSEMIDE 40 MG PO TABS: 60.0000 mg | ORAL_TABLET | Freq: Two times a day (BID) | ORAL | Status: DC

## 2012-12-23 NOTE — Telephone Encounter (Signed)
Message copied by Tarri Fuller on Tue Dec 23, 2012  5:39 PM ------      Message from: [place], [place] T      Created: Tue Dec 23, 2012  5:15 PM       Hemoglobin stable      Eosinophils elevated - cause unclear; patient needs follow up with PCP to further investigate      BNP normal      Sed rate near normal      Potassium and creatinine okay      Continue with current treatment plan.      Tereso Newcomer, PA-C  5:15 PM 12/23/2012 ------

## 2012-12-23 NOTE — Progress Notes (Signed)
[address] [place], [place]  [address] Phone: [phone] Fax:  [phone]  Date:  12/23/2012   ID:  Deborah [name], DOB [birth year], MRN [id number]  PCP:  Callie Fielding, MD  Primary Cardiologist:  Dr. Marca Ancona     History of Present Illness: Deborah [name] is a 62 y.o. female who returns for f/u after a recent admission to the hospital.  She has a hx of rheumatic heart disease and MV replacement/TV repair, chronic atrial fibrillation, and chronic diastolic CHF.  Last seen by Dr. [name] 12/11/12.  Her volume appeared to be up slightly and her Lasix was adjusted. She was then admitted 4/18-4/22 with suspected urosepsis/hypotension. She was also noted to have community-acquired pneumonia by chest x-ray. She was treated with antibiotics. She was volume overloaded and followed by cardiology. She required IV Lasix.  She was kept on Lasix 80 mg twice a day of discharge until follow up today.  Hemoglobin was also noted to be somewhat lower during her hospitalization.  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  Since d/c, she is breathing better.  She has not done that much since d/c.  She is likely NYHA Class IIb.  Sleeps on 2 pillows chronically.  She does not clearly describe PND.  No syncope.  No chest pain.    Labs (8/12):   K 3.5, creatinine 0.98  Labs (10/12): K 3.8, creatinine 1.0, BNP 116  Labs (1/13):   K 3.6, creatinine 0.9, LDL 85, HDL 62  Labs (9/13):   K 3.9, creatinine 1.0  Labs (4/14):   K 3.6, Cr 0.89, BNP 1094, Hgb 9.7,    Wt Readings from Last 3 Encounters:  12/23/12 154 lb 1.9 oz (69.908 kg)  12/16/12 153 lb 1.6 oz (69.446 kg)  12/11/12 156 lb 12.8 oz (71.124 kg)    Past Medical History  Diagnosis Date  . Rheumatic heart disease     Status post mechanical ([employer]) mitral valve replacement and tricuspid valve repair at [facility] in 2002; echo 5/12:   EF 60-65%, mild AI, mitral valve prosthesis with AVA  1.66, severe LAE, moderate RAE, moderate to severe TR, mild increased pulmonary artery systolic pressure;    Adenosine Cardiolite in 4/09:   No ischemia, EF 66%  . Chronic atrial fibrillation     coumadin managed by [facility]. CVRR;  Event montor (7/12-8/12) showed no high rate episodes (patient continuously in atrial fibrillation).   . Chronic diastolic heart failure   . Fatigue   . Borderline diabetes   . Hyperlipidemia   . Obesity   . Anemia   . Gout   . Hypertension   . Carpal tunnel syndrome   . External hemorrhoids   . Rotator cuff syndrome   . 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 Gaylyn Rong  . 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 Hx of cardiovascular stress test     ETT-myoview (7/12): No evidence for ischemia or infarction  . External hemorrhoid   . Allergic rhinitis     Current Outpatient Prescriptions  Medication Sig Dispense Refill  . acetaminophen (TYLENOL) 325 MG tablet Take 650 mg by mouth every 6 (six) hours as needed for pain.       Marland Kitchen allopurinol (ZYLOPRIM) 300 MG tablet Take 300 mg by mouth daily.       Marland Kitchen aspirin EC 81 MG tablet Take 81 mg by mouth daily.      Marland Kitchen  benzonatate (TESSALON) 100 MG capsule Take 100 mg by mouth 3 (three) times daily as needed for cough.      . chlorpheniramine-HYDROcodone (TUSSIONEX) 10-8 MG/5ML LQCR Take 5 mLs by mouth every 12 (twelve) hours as needed (cough).      . folic acid (FOLVITE) 1 MG tablet Take 1 mg by mouth daily.       . furosemide (LASIX) 80 MG tablet Take 80 mg by mouth 2 (two) times daily.      . hydrOXYzine (VISTARIL) 100 MG capsule Take 100 mg by mouth 3 (three) times daily as needed for itching.      Marland Kitchen levofloxacin (LEVAQUIN) 750 MG tablet Take 1 tablet (750 mg total) by mouth daily.  4 tablet  0  . loratadine (CLARITIN) 10 MG tablet Take 10 mg by mouth daily.      . metoprolol succinate (TOPROL-XL) 50 MG 24 hr tablet Take 50 mg by mouth daily. Take with or immediately following a meal.      . omeprazole (PRILOSEC)  20 MG capsule Take 20 mg by mouth daily before breakfast.      . potassium chloride SA (K-DUR,KLOR-CON) 20 MEQ tablet Take 20-40 mEq by mouth 2 (two) times daily. 2 tabs in the am, 1 tab in the pm      . predniSONE (DELTASONE) 10 MG tablet Takes  2 tabs for 1 days, then 1 tab for 1 days, and then stop.  3 tablet  0  . rosuvastatin (CRESTOR) 5 MG tablet Take 5 mg by mouth every other day.       . warfarin (COUMADIN) 2 MG tablet Take 1-2 mg by mouth every evening. Monday only-0.5 (1 mg total) half tab; All other days 1 tab (2 mg total)       No current facility-administered medications for this visit.    Allergies:    Allergies  Allergen Reactions  . Promethazine Hcl     REACTION: made her real shaky  . Promethazine Hcl     Social History:  The patient  reports that she has never smoked. She has never used smokeless tobacco. She reports that she does not drink alcohol or use illicit drugs.   ROS:  Please see the history of present illness.   She notes significant bilateral hand pain.  Xrays in the hospital on L with DJD and R negative for acute findings.    No significant cough.  No fever.  No melena or hematochezia.  All other systems reviewed and negative.   PHYSICAL EXAM: VS:  BP 114/68  Pulse 76  Ht 4\' 11"  (1.499 m)  Wt 154 lb 1.9 oz (69.908 kg)  BMI 31.11 kg/m2 Well nourished, well developed, in no acute distress HEENT: normal Neck: no JVD or HJR Cardiac:  mechanical S1, normal S2; irregularly irregular rhythm; no murmur Lungs:  clear to auscultation bilaterally, no wheezing, rhonchi or rales Abd: soft, nontender, no hepatomegaly Ext: no edema Skin: warm and dry Neuro:  CNs 2-12 intact, no focal abnormalities noted  EKG:  AFib, HR 76, rightward axis, no acute changes     ASSESSMENT AND PLAN:  1. Chronic Diastolic CHF:  Volume appears stable.  She can resume Lasix 60 mg BID.  Check BMET and BNP today.  She weighs daily.  She knows to call if weights increase > 3 lbs in one  day. 2. Atrial Fibrillation:  Rate controlled.  Coumadin is managed in the CVRR clinic. 3. S/p Mechanical MVR:  Coumadin managed  in the coumadin clinic.  Hgb was lower than prior in the hospital.  Repeat CBC today. 4. Pneumonia:  Seems to have resolved.  F/u with PCP. 5. Hand Arthralgias:  Seems quite severe.  We discussed proper use of acetaminophen.  Recommend f/u with PCP for evaluation.  I will check a ESR today. 6. Anemia:  She follows with hematology.  Check CBC today. 7. Hyperlipidemia:  Continue statin. 8. Hypertension:  Controlled.  Continue current therapy.  9. Disposition:  F/u with Dr. Marca Ancona in 01/2013 as planned.   Luna Glasgow, PA-C  12:47 PM 12/23/2012

## 2012-12-23 NOTE — Telephone Encounter (Signed)
pt notified about lab results and the need to f/u w/PCP about Eosinophils elevated, Sed rate near normal, will fax results to PCP today and mail copy to pt as well per pt request., pt knows to f/u w/PCP

## 2012-12-23 NOTE — Patient Instructions (Addendum)
LABS TODAY; BMET, CBC W/DIFF, BNP, ESR  DECREASE LASIX TO 60 MG TWICE DAILY; A NEW RX WAS SENT IN FOR THE 40 MG TABLET AND YOU WILL TAKE 1 AND 1/2 TABLETS TWICE DAILY  KEEP FOLLOW UP APPT WITH DR. [name] IN [place]

## 2012-12-26 ENCOUNTER — Encounter (HOSPITAL_COMMUNITY): Payer: Self-pay | Admitting: Emergency Medicine

## 2012-12-26 ENCOUNTER — Emergency Department (HOSPITAL_COMMUNITY)
Admission: EM | Admit: 2012-12-26 | Discharge: 2012-12-26 | Disposition: A | Discharge status: Home / Self Care | Payer: Medicaid Other | Source: Home / Self Care | Attending: Family Medicine | Admitting: Family Medicine

## 2012-12-26 DIAGNOSIS — M130 Polyarthritis, unspecified: Secondary | ICD-10-CM

## 2012-12-26 MED ORDER — DICLOFENAC SODIUM 1 % TD GEL: 4.0000 g | Freq: Four times a day (QID) | TRANSDERMAL | Status: DC

## 2012-12-26 NOTE — ED Provider Notes (Signed)
History     CSN: [id number]  Arrival date & time 12/26/12  1002   First MD Initiated Contact with Patient 12/26/12 1029      Chief Complaint  Patient presents with  . Hand Pain    (Consider location/radiation/quality/duration/timing/severity/associated sxs/prior treatment) Patient is a 62 y.o. female presenting with hand pain. The history is provided by the patient and a relative.  Hand Pain This is a chronic problem. The current episode started more than 1 week ago (sx for sev weeks, has had prolonged illnee during this time.). The problem has not changed since onset.The symptoms are aggravated by bending.    Past Medical History  Diagnosis Date  . Rheumatic heart disease     Status post mechanical (St. Jude) mitral valve replacement and tricuspid valve repair at [facility] in 2002; echo 5/12:   EF 60-65%, mild AI, mitral valve prosthesis with AVA 1.66, severe LAE, moderate RAE, moderate to severe TR, mild increased pulmonary artery systolic pressure;    Adenosine Cardiolite in 4/09:   No ischemia, EF 66%  . Chronic atrial fibrillation     coumadin managed by [facility]. CVRR;  Event montor (7/12-8/12) showed no high rate episodes (patient continuously in atrial fibrillation).   . Chronic diastolic heart failure   . Fatigue   . Borderline diabetes   . Hyperlipidemia   . Obesity   . Anemia   . Gout   . Hypertension   . Carpal tunnel syndrome   . External hemorrhoids   . Rotator cuff syndrome   . 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 Gaylyn Rong  . 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 Hx of cardiovascular stress test     ETT-myoview (7/12): No evidence for ischemia or infarction  . External hemorrhoid   . Allergic rhinitis     Past Surgical History  Procedure Laterality Date  . Tricuspid valve repair    . Cardiac catheterization      EF 55%    Family History  Problem Relation Age of Onset  . Stroke Mother   . Diabetes Mother   . Heart failure Mother   . Heart disease  Father     "heart problems"  . Heart disease Sister   . Heart disease Brother   . Gout Brother   . Diabetes Brother   . Stroke Brother   . Valvular heart disease Sister   .       History  Substance Use Topics  . Smoking status: Never Smoker   . Smokeless tobacco: Never Used  . Alcohol Use: No    OB History   Grav Para Term Preterm Abortions TAB SAB Ect Mult Living                  Review of Systems  Constitutional: Negative.   Musculoskeletal: Positive for joint swelling.    Allergies  Promethazine hcl and Promethazine hcl  Home Medications   Current Outpatient Rx  Name  Route  Sig  Dispense  Refill  . allopurinol (ZYLOPRIM) 300 MG tablet   Oral   Take 300 mg by mouth daily.          Marland Kitchen aspirin EC 81 MG tablet   Oral   Take 81 mg by mouth daily.         . folic acid (FOLVITE) 1 MG tablet   Oral   Take 1 mg by mouth daily.          Marland Kitchen  furosemide (LASIX) 40 MG tablet   Oral   Take 1.5 tablets (60 mg total) by mouth 2 (two) times daily.         Marland Kitchen loratadine (CLARITIN) 10 MG tablet   Oral   Take 10 mg by mouth daily.         . metoprolol succinate (TOPROL-XL) 50 MG 24 hr tablet   Oral   Take 50 mg by mouth daily. Take with or immediately following a meal.         . omeprazole (PRILOSEC) 20 MG capsule   Oral   Take 20 mg by mouth daily before breakfast.         . potassium chloride SA (K-DUR,KLOR-CON) 20 MEQ tablet   Oral   Take 20-40 mEq by mouth 2 (two) times daily. 2 tabs in the am, 1 tab in the pm         . rosuvastatin (CRESTOR) 5 MG tablet   Oral   Take 5 mg by mouth every other day.          . warfarin (COUMADIN) 2 MG tablet   Oral   Take 1-2 mg by mouth every evening. Monday only-0.5 (1 mg total) half tab; All other days 1 tab (2 mg total)         . acetaminophen (TYLENOL) 325 MG tablet   Oral   Take 650 mg by mouth every 6 (six) hours as needed for pain.          . benzonatate (TESSALON) 100 MG capsule   Oral    Take 100 mg by mouth 3 (three) times daily as needed for cough.         . chlorpheniramine-HYDROcodone (TUSSIONEX) 10-8 MG/5ML LQCR   Oral   Take 5 mLs by mouth every 12 (twelve) hours as needed (cough).         . hydrOXYzine (VISTARIL) 100 MG capsule   Oral   Take 100 mg by mouth 3 (three) times daily as needed for itching.         Marland Kitchen levofloxacin (LEVAQUIN) 750 MG tablet   Oral   Take 1 tablet (750 mg total) by mouth daily.   4 tablet   0   . predniSONE (DELTASONE) 10 MG tablet      Takes  2 tabs for 1 days, then 1 tab for 1 days, and then stop.   3 tablet   0     There were no vitals taken for this visit.  Physical Exam  Nursing note and vitals reviewed. Constitutional: She is oriented to person, place, and time. She appears well-developed and well-nourished.  Musculoskeletal: She exhibits tenderness.  Symmetrical bilat polyarthralgia of hands,no visible sts or rash or deformity.  Neurological: She is alert and oriented to person, place, and time.  Skin: Skin is warm and dry.    ED Course  Procedures (including critical care time)  Labs Reviewed - No data to display No results found.   No diagnosis found.    MDM          Linna Hoff, MD 12/26/12 1058

## 2012-12-26 NOTE — ED Notes (Signed)
Both hands hurt, unable to make fists due to pain and all fingertips are numb.  Onset a month ago, went to [facility] seen there and they sent for xray, xray fine, still no better.  Meanwhile admitted to hospital for pneumonia/sepsis per family member.  Hands hurt while in hospital, but focus was on infection, not on hands.  Saw heart doctor and told [facility] to come here.  Patient reports having a rash, generalized, itching prior to hospitalization

## 2013-01-13 ENCOUNTER — Ambulatory Visit (INDEPENDENT_AMBULATORY_CARE_PROVIDER_SITE_OTHER): Payer: Medicaid Other | Admitting: *Deleted

## 2013-01-13 DIAGNOSIS — Z954 Presence of other heart-valve replacement: Secondary | ICD-10-CM

## 2013-01-13 DIAGNOSIS — I059 Rheumatic mitral valve disease, unspecified: Secondary | ICD-10-CM

## 2013-01-13 DIAGNOSIS — Z7901 Long term (current) use of anticoagulants: Secondary | ICD-10-CM

## 2013-01-20 ENCOUNTER — Other Ambulatory Visit: Payer: Self-pay | Admitting: *Deleted

## 2013-01-20 ENCOUNTER — Other Ambulatory Visit: Payer: Self-pay | Admitting: Cardiology

## 2013-01-20 NOTE — Telephone Encounter (Signed)
See refill done in basket

## 2013-01-21 ENCOUNTER — Ambulatory Visit (INDEPENDENT_AMBULATORY_CARE_PROVIDER_SITE_OTHER): Payer: Medicaid Other | Admitting: Pharmacist

## 2013-01-21 DIAGNOSIS — Z7901 Long term (current) use of anticoagulants: Secondary | ICD-10-CM

## 2013-01-21 DIAGNOSIS — I059 Rheumatic mitral valve disease, unspecified: Secondary | ICD-10-CM

## 2013-01-21 DIAGNOSIS — Z954 Presence of other heart-valve replacement: Secondary | ICD-10-CM

## 2013-01-21 LAB — POCT INR: INR: 3.4

## 2013-01-23 ENCOUNTER — Other Ambulatory Visit: Payer: Self-pay | Admitting: *Deleted

## 2013-01-27 ENCOUNTER — Other Ambulatory Visit: Payer: Self-pay | Admitting: Oncology

## 2013-01-27 ENCOUNTER — Encounter: Payer: Self-pay | Admitting: Oncology

## 2013-01-27 DIAGNOSIS — D649 Anemia, unspecified: Secondary | ICD-10-CM

## 2013-01-27 DIAGNOSIS — D721 Eosinophilia, unspecified: Secondary | ICD-10-CM

## 2013-01-27 HISTORY — DX: Eosinophilia, unspecified: D72.10

## 2013-01-28 ENCOUNTER — Other Ambulatory Visit (INDEPENDENT_AMBULATORY_CARE_PROVIDER_SITE_OTHER): Payer: Medicaid Other

## 2013-01-28 ENCOUNTER — Ambulatory Visit (INDEPENDENT_AMBULATORY_CARE_PROVIDER_SITE_OTHER): Payer: Medicaid Other | Admitting: Cardiology

## 2013-01-28 ENCOUNTER — Telehealth: Payer: Self-pay | Admitting: Oncology

## 2013-01-28 ENCOUNTER — Encounter: Payer: Self-pay | Admitting: Cardiology

## 2013-01-28 VITALS — BP 110/70 | HR 74 | Ht 59.0 in | Wt 153.0 lb

## 2013-01-28 DIAGNOSIS — I482 Chronic atrial fibrillation, unspecified: Secondary | ICD-10-CM

## 2013-01-28 DIAGNOSIS — I5032 Chronic diastolic (congestive) heart failure: Secondary | ICD-10-CM

## 2013-01-28 DIAGNOSIS — I4891 Unspecified atrial fibrillation: Secondary | ICD-10-CM

## 2013-01-28 DIAGNOSIS — I509 Heart failure, unspecified: Secondary | ICD-10-CM

## 2013-01-28 DIAGNOSIS — R0989 Other specified symptoms and signs involving the circulatory and respiratory systems: Secondary | ICD-10-CM

## 2013-01-28 DIAGNOSIS — I359 Nonrheumatic aortic valve disorder, unspecified: Secondary | ICD-10-CM

## 2013-01-28 MED ORDER — METOPROLOL SUCCINATE ER 50 MG PO TB24: ORAL_TABLET | ORAL | Status: DC

## 2013-01-28 NOTE — Telephone Encounter (Signed)
s.w. pt and gv appt d.t. pt ok and aware

## 2013-01-28 NOTE — Patient Instructions (Addendum)
Increase Toprol XL (metoprolol succinate) to 75mg  daily. This will be 1 and 1/2 of a 50mg  tablet (total 75mg ) daily.  Your physician recommends that you schedule a follow-up appointment in: 4 months with Dr [name].   Your physician recommends that you return for lab work in: 4 months when you see Dr Frutoso Chase.

## 2013-01-29 NOTE — Progress Notes (Signed)
Patient ID: Deborah [name], female   DOB: February 09, 1951, 62 y.o.   MRN: [id number] PCP: Dala Dock, Dr. Jeanella Flattery  62 yo with rheumatic heart disease and MV replacement/TV repair, chronic atrial fibrillation, and chronic diastolic CHF presents for cardiology followup.  Since last appointment, she was admitted with urosepsis and also a component of acute on chronic diastolic CHF in 4/14.  She was treated with IV Lasix and antibiotics.  Since discharge, she has been doing well.  She can walk for about 10 minutes then gets short of breath.  Her weight is down about 1 lb since last appointment.  She is a little short of breath walking up steps.  Her main complaint is that when she exerts herself, her heart feels like it races. She is now taking 10 mg daily of Zocor because she got myalgias with 20 mg daily.    Labs (8/12): K 3.5, creatinine 0.98 Labs (10/12): K 3.8, creatinine 1.0, BNP 116 Labs (1/13): K 3.6, creatinine 0.9, LDL 85, HDL 62 Labs (9/13): K 3.9, creatinine 1.0 Labs (4/14): HCT 37.8 with elevated eosinophils, BNP 1094 => 82, K 3.6, creatinine 1.0  PMH: 1. Rheumatic heart disease: Status post mechanical MV replacement and tricuspid repair at [facility] in 2002.  Last echo 5/12 with EF 60-65%, mild AI, mechanical MV prosthesis with normal function, severe LAE, moderate RAE, moderate to severe TR, mildly elevated PA systolic pressure.  2. Chronic atrial fibrillation on coumadin.  Event montor (7/12-8/12) showed no high rate episodes (patient continuously in atrial fibrillation).  3. Chronic diastolic CHF 4. ETT-myoview (7/12): No evidence for ischemia or infarction.  5. Impaired fasting glucose 6. Hyperlipidemia  7. HTN 8. Gout 9. Anemia 10. External hemorrhoids.  11. 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   SH: Nonsmoker. Lives in [place].   FH: No premature CAD  ROS: All systems reviewed and negative except as per HPI.   Current Outpatient  Prescriptions  Medication Sig Dispense Refill  . acetaminophen (TYLENOL) 325 MG tablet Take 650 mg by mouth every 6 (six) hours as needed for pain.       Marland Kitchen allopurinol (ZYLOPRIM) 300 MG tablet Take 300 mg by mouth daily.       Marland Kitchen aspirin EC 81 MG tablet Take 81 mg by mouth daily.      . chlorpheniramine-HYDROcodone (TUSSIONEX) 10-8 MG/5ML LQCR Take 5 mLs by mouth every 12 (twelve) hours as needed (cough).      . COUMADIN 2 MG tablet Take as directed by coumadin clinic  35 tablet  3  . diclofenac sodium (VOLTAREN) 1 % GEL Apply 4 g topically 4 (four) times daily. To hands, please instruct in dosing  3 Tube  1  . folic acid (FOLVITE) 1 MG tablet Take 1 mg by mouth daily.       . furosemide (LASIX) 40 MG tablet Take 1.5 tablets (60 mg total) by mouth 2 (two) times daily.      . hydrOXYzine (VISTARIL) 100 MG capsule Take 100 mg by mouth 3 (three) times daily as needed for itching.      . loratadine (CLARITIN) 10 MG tablet Take 10 mg by mouth daily.      . metoprolol succinate (TOPROL XL) 50 MG 24 hr tablet 1 and 1/2 tablets daily (total 75mg ) daily.BRAND NAME ONLY  -per [place] Medicaid-[phone] Toniann Fail -BRAND NAME ONLY TOPROL XL 50mg  daily does not require prior authorization.  45 tablet  6  . omeprazole (PRILOSEC) 20 MG  capsule Take 20 mg by mouth daily before breakfast.      . potassium chloride SA (K-DUR,KLOR-CON) 20 MEQ tablet Take 20-40 mEq by mouth 2 (two) times daily. 2 tabs in the am, 1 tab in the pm      . simvastatin (ZOCOR) 10 MG tablet Take 10 mg by mouth 2 (two) times daily at 10 am and 4 pm.       No current facility-administered medications for this visit.    BP 110/70  Pulse 74  Ht 4\' 11"  (1.499 m)  Wt 153 lb (69.4 kg)  BMI 30.89 kg/m2  SpO2 97% General: NAD Neck: JVP 7 cm, no thyromegaly or thyroid nodule.  Lungs: Clear to auscultation bilaterally with normal respiratory effort. CV: Nondisplaced PMI.  Heart irregular S1/S2, no S3/S4, mechanical S2, 1/6 HSM LLSB.  No  peripheral edema.  No carotid bruit.  Normal pedal pulses.  Abdomen: Soft, nontender, no hepatosplenomegaly, no distention.  Neurologic: Alert and oriented x 3.  Psych: Normal affect. Extremities: No clubbing or cyanosis.   Assessment/Plan: 1. Atrial fibrillation: Chronic.  Continue warfarin.  She reports that her heart "races" when she exerts herself.  I will try having her increase Toprol XL to 75 mg daily to see if this helps with that sensation.   2. Mechanical aortic valve: Continue ASA/coumadin, INR goal 2.5-3.5.  Will need bridging with heparin/Lovenox if has to come off coumadin in the future for procedure.  3. Diastolic CHF: Chronic.  She looks euvolemic today.  Continue current Lasix and KCl.   4. Hyperlipidemia: Zocor decreased due to myalgias. 5. Eosinophilia: ? Etiology.  No known parasitic infection.  Patient will followup on this with her PCP.    Marca Ancona 01/29/2013 5:51 AM

## 2013-02-04 ENCOUNTER — Encounter: Payer: Self-pay | Admitting: *Deleted

## 2013-02-04 ENCOUNTER — Encounter: Payer: Medicaid Other | Attending: Internal Medicine | Admitting: *Deleted

## 2013-02-04 VITALS — Ht 59.0 in | Wt 152.2 lb

## 2013-02-04 DIAGNOSIS — I10 Essential (primary) hypertension: Secondary | ICD-10-CM | POA: Insufficient documentation

## 2013-02-04 DIAGNOSIS — R7309 Other abnormal glucose: Secondary | ICD-10-CM | POA: Insufficient documentation

## 2013-02-04 DIAGNOSIS — K219 Gastro-esophageal reflux disease without esophagitis: Secondary | ICD-10-CM | POA: Insufficient documentation

## 2013-02-04 DIAGNOSIS — E785 Hyperlipidemia, unspecified: Secondary | ICD-10-CM

## 2013-02-04 DIAGNOSIS — Z713 Dietary counseling and surveillance: Secondary | ICD-10-CM | POA: Insufficient documentation

## 2013-02-04 NOTE — Progress Notes (Signed)
  Medical Nutrition Therapy:  Appt start time: 1030 end time:  1130.   Assessment:  Primary concerns today: Deborah [name] is here for for nutrition counseling pertaining to prediabetes.  Her HbA1c is 5.9%.  She also has HTN, hyperlipidemia,  CHF, GERD, and gout.  Her diet is fairly healthy, but she eats large portions of rice and fruit juices.  She has cut back on sweets lately and she drinks solely water.  She walks some times.   MEDICATIONS: see list   DIETARY INTAKE:  Usual eating pattern includes 2-3 meals and 1 snacks per day.  Everyday foods include rice protein, vegetables, fruit juice.  Avoided foods include dairy.    24-hr recall:  B ( AM): might skip.  Might have small bowl rice or toast with 1 egg  Snk ( AM): none  L ( PM): small bowl rice with vegetables or meat Snk ( PM): juice (apple, strawberry blueberry) 8 oz D ( PM): soup or rice or bread with meat or vegetables Snk ( PM): not usually Beverages: water  Usual physical activity: walks 3-4 times a week for 20-30 minutes   Estimated energy needs: 1400-1500 calories 158 g carbohydrates 105 g protein 39 g fat  Progress Towards Goal(s):  In progress.   Nutritional Diagnosis:  NB-1.1 Food and nutrition-related knowledge deficit As related to proper balance of fats, carbohydrates, and protein.  As evidenced by medical hisotry including recent diagnosis of prediabetes.    Intervention:  Nutrition counseling provided.  Discussed briefly the physiology of (pre) diabetes and the role of weight gain on insulin resistance.  Encouraged moderate weight loss through diet and exercise.  Discussed diagnostic criteria and the HbA1c test.  Encouraged regularly activity such as walking 20-30 minutes 4-5 times a week,  Discussed carb counting.  Recommended 30-45 g/meal and 15 g/snack.  Encouraged lean proteins and non-starch vegetables.  Reviewed My Plate recommendations for diabetes  Handouts given during visit include: Living Well with  Diabetes Carb Counting and Food Label handouts Meal Plan Card   Monitoring/Evaluation:  Dietary intake, exercise, lab data, and body weight prn.

## 2013-02-04 NOTE — Patient Instructions (Signed)
Goals:  Follow Diabetes Meal Plan as instructed  Eat 3 meals and 2 snacks, every 3-5 hrs  Limit carbohydrate intake to 30-45 grams carbohydrate/meal  Limit carbohydrate intake to 15 grams carbohydrate/snack  Add lean protein foods to meals/snacks  Monitor glucose levels as instructed by your doctor  Aim for 30 mins of physical activity daily  Bring food record and glucose log to your next nutrition visit 

## 2013-02-11 ENCOUNTER — Telehealth: Payer: Self-pay | Admitting: Cardiology

## 2013-02-11 ENCOUNTER — Ambulatory Visit (INDEPENDENT_AMBULATORY_CARE_PROVIDER_SITE_OTHER): Payer: Medicaid Other | Admitting: Pharmacist

## 2013-02-11 DIAGNOSIS — Z954 Presence of other heart-valve replacement: Secondary | ICD-10-CM

## 2013-02-11 DIAGNOSIS — I059 Rheumatic mitral valve disease, unspecified: Secondary | ICD-10-CM

## 2013-02-11 DIAGNOSIS — Z7901 Long term (current) use of anticoagulants: Secondary | ICD-10-CM

## 2013-02-11 LAB — POCT INR: INR: 3.9

## 2013-02-11 NOTE — Telephone Encounter (Signed)
I did not call patient today. I spoke with patient and she is aware that I did not call her today.

## 2013-02-11 NOTE — Telephone Encounter (Signed)
New Prob     Pt states she received a phone call and is returning the call. Please call back.

## 2013-02-12 ENCOUNTER — Telehealth: Payer: Self-pay | Admitting: Oncology

## 2013-02-12 ENCOUNTER — Other Ambulatory Visit (HOSPITAL_BASED_OUTPATIENT_CLINIC_OR_DEPARTMENT_OTHER): Payer: Medicaid Other | Admitting: Lab

## 2013-02-12 ENCOUNTER — Ambulatory Visit (HOSPITAL_BASED_OUTPATIENT_CLINIC_OR_DEPARTMENT_OTHER): Payer: Medicaid Other | Admitting: Oncology

## 2013-02-12 VITALS — BP 116/64 | HR 89 | Temp 97.9°F | Resp 18 | Ht 59.0 in | Wt 152.7 lb

## 2013-02-12 DIAGNOSIS — D509 Iron deficiency anemia, unspecified: Secondary | ICD-10-CM

## 2013-02-12 DIAGNOSIS — D649 Anemia, unspecified: Secondary | ICD-10-CM

## 2013-02-12 LAB — CBC WITH DIFFERENTIAL/PLATELET
BASO%: 2.8 % — ABNORMAL HIGH (ref 0.0–2.0)
Basophils Absolute: 0.2 10*3/uL — ABNORMAL HIGH (ref 0.0–0.1)
EOS%: 10.8 % — ABNORMAL HIGH (ref 0.0–7.0)
HCT: 34.5 % — ABNORMAL LOW (ref 34.8–46.6)
HGB: 11.2 g/dL — ABNORMAL LOW (ref 11.6–15.9)
MCH: 24.1 pg — ABNORMAL LOW (ref 25.1–34.0)
MCHC: 32.5 g/dL (ref 31.5–36.0)
MONO#: 0.7 10*3/uL (ref 0.1–0.9)
NEUT%: 45.1 % (ref 38.4–76.8)
RDW: 16.4 % — ABNORMAL HIGH (ref 11.2–14.5)
WBC: 5.8 10*3/uL (ref 3.9–10.3)
lymph#: 1.7 10*3/uL (ref 0.9–3.3)

## 2013-02-12 LAB — COMPREHENSIVE METABOLIC PANEL (CC13)
Albumin: 4.1 g/dL (ref 3.5–5.0)
BUN: 14.7 mg/dL (ref 7.0–26.0)
CO2: 24 mEq/L (ref 22–29)
Calcium: 9.4 mg/dL (ref 8.4–10.4)
Glucose: 115 mg/dl — ABNORMAL HIGH (ref 70–99)
Potassium: 3.6 mEq/L (ref 3.5–5.1)
Sodium: 143 mEq/L (ref 136–145)
Total Protein: 7.3 g/dL (ref 6.4–8.3)

## 2013-02-12 LAB — MORPHOLOGY

## 2013-02-12 NOTE — Telephone Encounter (Signed)
Gave pt appt for labs every 2 months until February 2014 and see Md on March 2014

## 2013-02-12 NOTE — Progress Notes (Signed)
[facility]  Telephone:([phone] Fax:([phone]) [phone]   OFFICE PROGRESS NOTE   Cc:  Pcp Not In System  DIAGNOSIS:  Hgb E and Hgb Constant Spring without anemia; and slight elevation on eosinophil  CURRENT THERAPY: watchful observation.   INTERVAL HISTORY: Deborah [name] 62 y.o. female returns for regular follow up with her daughter.  In 11/2012, she developed nonpapular, erythematous rash throughout her body.  She then developed hives.  She presented to ED and was thought to have urosepsis and pneumonia.  She had sepsis physiology and was in ICU.  Since discharge, she has not noticed any skin rash/hive.  She still has mild fatigue.  Her stamina is not as strong as it was last year. She also had congestive heart failure symptoms when she was admitted.  Since discharge, her edema has slowly resolved.  Patient currently denies fever, anorexia, weight loss, headache, visual changes, confusion, drenching night sweats, palpable lymph node swelling, mucositis, odynophagia, dysphagia, nausea vomiting, jaundice, chest pain, palpitation, shortness of breath, dyspnea on exertion, productive cough, gum bleeding, epistaxis, hematemesis, hemoptysis, abdominal pain, abdominal swelling, early satiety, melena, hematochezia, hematuria, skin rash, spontaneous bleeding, joint swelling, joint pain, heat or cold intolerance, bowel bladder incontinence, back pain, focal motor weakness, paresthesia.      Past Medical History  Diagnosis Date  . Rheumatic heart disease     Status post mechanical (St. Jude) mitral valve replacement and tricuspid valve repair at [facility] in 2002; echo 5/12:   EF 60-65%, mild AI, mitral valve prosthesis with AVA 1.66, severe LAE, moderate RAE, moderate to severe TR, mild increased pulmonary artery systolic pressure;    Adenosine Cardiolite in 4/09:   No ischemia, EF 66%  . Chronic atrial fibrillation     coumadin managed by [facility]. CVRR;  Event montor (7/12-8/12)  showed no high rate episodes (patient continuously in atrial fibrillation).   . Chronic diastolic heart failure   . Fatigue   . Borderline diabetes   . Hyperlipidemia   . Obesity   . Anemia   . Gout   . Hypertension   . Carpal tunnel syndrome   . External hemorrhoids   . Rotator cuff syndrome   . 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 Gaylyn Rong  . 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 Hx of cardiovascular stress test     ETT-myoview (7/12): No evidence for ischemia or infarction  . External hemorrhoid   . Allergic rhinitis   . Eosinophilia 01/27/2013    Past Surgical History  Procedure Laterality Date  . Tricuspid valve repair    . Cardiac catheterization      EF 55%    Current Outpatient Prescriptions  Medication Sig Dispense Refill  . acetaminophen (TYLENOL) 325 MG tablet Take 650 mg by mouth every 6 (six) hours as needed for pain.       Marland Kitchen allopurinol (ZYLOPRIM) 300 MG tablet Take 300 mg by mouth daily.       Marland Kitchen aspirin EC 81 MG tablet Take 81 mg by mouth daily.      Marland Kitchen COUMADIN 2 MG tablet Take as directed by coumadin clinic  35 tablet  3  . folic acid (FOLVITE) 1 MG tablet Take 1 mg by mouth daily.       . furosemide (LASIX) 40 MG tablet Take 1.5 tablets (60 mg total) by mouth 2 (two) times daily.      Marland Kitchen loratadine (CLARITIN) 10 MG tablet Take 10 mg by mouth  daily.      . metoprolol succinate (TOPROL XL) 50 MG 24 hr tablet 1 and 1/2 tablets daily (total 75mg ) daily.BRAND NAME ONLY  -per [place] Medicaid-[phone] Toniann Fail -BRAND NAME ONLY TOPROL XL 50mg  daily does not require prior authorization.  45 tablet  6  . omeprazole (PRILOSEC) 20 MG capsule Take 20 mg by mouth daily before breakfast.      . potassium chloride SA (K-DUR,KLOR-CON) 20 MEQ tablet Take 20-40 mEq by mouth 2 (two) times daily. 2 tabs in the am, 1 tab in the pm      . simvastatin (ZOCOR) 10 MG tablet Take 10 mg by mouth 2 (two) times daily at 10 am and 4 pm.       No current facility-administered medications  for this visit.    ALLERGIES:  is allergic to promethazine hcl and promethazine hcl.  REVIEW OF SYSTEMS:  The rest of the 14-point review of system was negative.   Filed Vitals:   02/12/13 0829  BP: 116/64  Pulse: 89  Temp: 97.9 F (36.6 C)  Resp: 18   Wt Readings from Last 3 Encounters:  02/12/13 152 lb 11.2 oz (69.264 kg)  02/04/13 152 lb 3.2 oz (69.037 kg)  01/28/13 153 lb (69.4 kg)   ECOG Performance status: 0-1  PHYSICAL EXAMINATION:   General:  well-nourished woman, in no acute distress.  Eyes:  no scleral icterus.  ENT:  There were no oropharyngeal lesions.  Neck was without thyromegaly.  Lymphatics:  Negative cervical, supraclavicular or axillary adenopathy.  Respiratory: lungs were clear bilaterally without wheezing or crackles.  Cardiovascular:  Regular rate and rhythm, S1/S2, without murmur, rub or gallop.  There was no pedal edema.  GI:  abdomen was soft, flat, nontender, nondistended, without organomegaly.  Muscoloskeletal:  no spinal tenderness of palpation of vertebral spine. There was no left hip pain on palpation.  Skin exam was without echymosis, petichae.  I did not see any rash today. Neuro exam was nonfocal.  Patient was able to get on and off exam table without assistance.  Gait was normal.  Patient was alert and oriented.  Attention was good.   Language was appropriate.  Mood was normal without depression.  Speech was not pressured.  Thought content was not tangential.     LABORATORY/RADIOLOGY DATA:  Lab Results  Component Value Date   WBC 5.8 02/12/2013   HGB 11.2* 02/12/2013   HCT 34.5* 02/12/2013   PLT 201 02/12/2013   GLUCOSE 115* 02/12/2013   CHOL 205* 12/11/2012   TRIG 225.0* 12/11/2012   HDL 51.30 12/11/2012   LDLDIRECT 104.2 12/11/2012   LDLCALC 112* 03/24/2012   ALKPHOS 72 02/12/2013   ALT 27 02/12/2013   AST 28 02/12/2013   NA 143 02/12/2013   K 3.6 02/12/2013   CL 108* 02/12/2013   CREATININE 0.8 02/12/2013   BUN 14.7 02/12/2013   CO2 24 02/12/2013    INR 3.9 02/11/2013   HGBA1C 5.7 04/04/2010    I personally reviewed the patient's peripheral blood smear today.  There was isocytosis.  There was no peripheral blast.  There were moderate target cells and increased central palor. There was no schistocytosis, spherocytosis, target cell.    ASSESSMENT AND PLAN:   1.  Hemoglobin E and Constant Spring:   - stable.  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 No transfusion is indicated.  I informed her daughter who accompanied her today that this diagnosis is autosomal dominant.  If  patient's children have anemia, they should be screened for this diagnosis.   2.  Transient elevated eosinophil:  Most likely due to acute infection in April 2014.  Since then, her eosinophilia has almost normalized.  There is low concern for primary bone marrow disease such as hypereosinophilic syndrome or myeloproliferative disease.  A bone marrow biopsy at this time is low yield.  I recommended to continue observation with serial CBC check every 3 months.  If her eosinophilia significantly worsens, then further work up may be considered.   3.  Follow up:  In about 9 months but sooner if she has concerning symptoms.    I informed Ms. Philbrick that I am leaving the practice.  [facility] to see another provider when she returns.     The length of time of the face-to-face encounter was 15  minutes. More than 50% of time was spent counseling and coordination of care.

## 2013-03-04 ENCOUNTER — Ambulatory Visit (INDEPENDENT_AMBULATORY_CARE_PROVIDER_SITE_OTHER): Payer: Medicaid Other | Admitting: *Deleted

## 2013-03-04 DIAGNOSIS — I059 Rheumatic mitral valve disease, unspecified: Secondary | ICD-10-CM

## 2013-03-04 DIAGNOSIS — Z954 Presence of other heart-valve replacement: Secondary | ICD-10-CM

## 2013-03-04 DIAGNOSIS — Z7901 Long term (current) use of anticoagulants: Secondary | ICD-10-CM

## 2013-04-01 ENCOUNTER — Ambulatory Visit (INDEPENDENT_AMBULATORY_CARE_PROVIDER_SITE_OTHER): Payer: Medicaid Other | Admitting: *Deleted

## 2013-04-01 DIAGNOSIS — Z7901 Long term (current) use of anticoagulants: Secondary | ICD-10-CM

## 2013-04-01 DIAGNOSIS — Z954 Presence of other heart-valve replacement: Secondary | ICD-10-CM

## 2013-04-01 DIAGNOSIS — I059 Rheumatic mitral valve disease, unspecified: Secondary | ICD-10-CM

## 2013-04-14 ENCOUNTER — Other Ambulatory Visit (HOSPITAL_BASED_OUTPATIENT_CLINIC_OR_DEPARTMENT_OTHER): Payer: Medicaid Other | Admitting: Lab

## 2013-04-14 ENCOUNTER — Telehealth: Payer: Self-pay | Admitting: *Deleted

## 2013-04-14 DIAGNOSIS — D582 Other hemoglobinopathies: Secondary | ICD-10-CM

## 2013-04-14 DIAGNOSIS — D560 Alpha thalassemia: Secondary | ICD-10-CM

## 2013-04-14 LAB — CBC WITH DIFFERENTIAL/PLATELET
Basophils Absolute: 0.1 10*3/uL (ref 0.0–0.1)
Eosinophils Absolute: 0.3 10*3/uL (ref 0.0–0.5)
HCT: 36.6 % (ref 34.8–46.6)
HGB: 11.4 g/dL — ABNORMAL LOW (ref 11.6–15.9)
LYMPH%: 26.3 % (ref 14.0–49.7)
MONO#: 0.7 10*3/uL (ref 0.1–0.9)
NEUT#: 2.6 10*3/uL (ref 1.5–6.5)
NEUT%: 51.5 % (ref 38.4–76.8)
Platelets: 170 10*3/uL (ref 145–400)
WBC: 5.1 10*3/uL (ref 3.9–10.3)

## 2013-04-14 LAB — COMPREHENSIVE METABOLIC PANEL (CC13)
CO2: 24 mEq/L (ref 22–29)
Creatinine: 0.9 mg/dL (ref 0.6–1.1)
Glucose: 116 mg/dl (ref 70–140)
Total Bilirubin: 2.11 mg/dL — ABNORMAL HIGH (ref 0.20–1.20)

## 2013-04-14 NOTE — Telephone Encounter (Signed)
Informed pt of lab results and Kristin's message below.  Keep lab appt in 2 months as scheduled.  Pt verbalized understanding and requests copy of lab mailed to her home address.

## 2013-04-14 NOTE — Telephone Encounter (Signed)
Message copied by Wende Mott on Tue Apr 14, 2013  5:35 PM ------      Message from: Myrtis Ser      Created: Tue Apr 14, 2013  1:55 PM       Please call pt. Labs remains stable. Continue observation. ------

## 2013-04-22 ENCOUNTER — Other Ambulatory Visit: Payer: Self-pay | Admitting: Cardiology

## 2013-04-29 ENCOUNTER — Ambulatory Visit (INDEPENDENT_AMBULATORY_CARE_PROVIDER_SITE_OTHER): Payer: Medicaid Other | Admitting: *Deleted

## 2013-04-29 DIAGNOSIS — Z7901 Long term (current) use of anticoagulants: Secondary | ICD-10-CM

## 2013-04-29 DIAGNOSIS — I059 Rheumatic mitral valve disease, unspecified: Secondary | ICD-10-CM

## 2013-04-29 DIAGNOSIS — Z954 Presence of other heart-valve replacement: Secondary | ICD-10-CM

## 2013-04-29 LAB — POCT INR: INR: 2.8

## 2013-04-30 ENCOUNTER — Emergency Department (HOSPITAL_COMMUNITY)
Admission: EM | Admit: 2013-04-30 | Discharge: 2013-04-30 | Disposition: A | Discharge status: Home / Self Care | Payer: Medicaid Other | Source: Home / Self Care | Attending: Family Medicine | Admitting: Family Medicine

## 2013-04-30 ENCOUNTER — Encounter (HOSPITAL_COMMUNITY): Payer: Self-pay | Admitting: *Deleted

## 2013-04-30 DIAGNOSIS — G5600 Carpal tunnel syndrome, unspecified upper limb: Secondary | ICD-10-CM

## 2013-04-30 DIAGNOSIS — E785 Hyperlipidemia, unspecified: Secondary | ICD-10-CM

## 2013-04-30 DIAGNOSIS — R21 Rash and other nonspecific skin eruption: Secondary | ICD-10-CM

## 2013-04-30 DIAGNOSIS — M109 Gout, unspecified: Secondary | ICD-10-CM

## 2013-04-30 DIAGNOSIS — G5601 Carpal tunnel syndrome, right upper limb: Secondary | ICD-10-CM

## 2013-04-30 LAB — BASIC METABOLIC PANEL
BUN: 20 mg/dL (ref 6–23)
CO2: 26 mEq/L (ref 19–32)
Glucose, Bld: 101 mg/dL — ABNORMAL HIGH (ref 70–99)
Potassium: 3.6 mEq/L (ref 3.5–5.1)
Sodium: 140 mEq/L (ref 135–145)

## 2013-04-30 MED ORDER — SIMVASTATIN 10 MG PO TABS: 10.0000 mg | ORAL_TABLET | Freq: Two times a day (BID) | ORAL | Status: DC

## 2013-04-30 MED ORDER — TRAMADOL HCL 50 MG PO TABS: 50.0000 mg | ORAL_TABLET | Freq: Every evening | ORAL | Status: DC | PRN

## 2013-04-30 MED ORDER — ALLOPURINOL 300 MG PO TABS: 300.0000 mg | ORAL_TABLET | Freq: Every day | ORAL | Status: DC

## 2013-04-30 MED ORDER — OMEPRAZOLE 20 MG PO CPDR: 20.0000 mg | DELAYED_RELEASE_CAPSULE | Freq: Every day | ORAL | Status: DC

## 2013-04-30 MED ORDER — FOLIC ACID 1 MG PO TABS: 1.0000 mg | ORAL_TABLET | Freq: Every day | ORAL | Status: DC

## 2013-04-30 NOTE — ED Provider Notes (Signed)
Deborah [name] is a 62 y.o. female who presents to Urgent Care today for medication refill. Patient was being seen by her primary care provider. However Medicaid card has [facility] listed and she is now agreeable to be seen until she can get her card change. She has run out of her simvastatin, tramadol, omeprazole, and allopurinol. She would like refills of these of possible. This is to treat chronic hand pain due to carpal tunnel and DJD, hyperlipidemia, GERD and, gout.   Additionally she notes an occasional skin irritation on her body. She has not used any medication for this. She notes small mildly itchy macules scattered on her hands and left arm. No fevers or chills pain nausea vomiting diarrhea.   A chest pains or trouble breathing   Past Medical History  Diagnosis Date  . Rheumatic heart disease     Status post mechanical ([employer]) mitral valve replacement and tricuspid valve repair at [facility] in 2002; echo 5/12:   EF 60-65%, mild AI, mitral valve prosthesis with AVA 1.66, severe LAE, moderate RAE, moderate to severe TR, mild increased pulmonary artery systolic pressure;    Adenosine Cardiolite in 4/09:   No ischemia, EF 66%  . Chronic atrial fibrillation     coumadin managed by [facility]. CVRR;  Event montor (7/12-8/12) showed no high rate episodes (patient continuously in atrial fibrillation).   . Chronic diastolic heart failure   . Fatigue   . Borderline diabetes   . Hyperlipidemia   . Obesity   . Anemia   . Gout   . Hypertension   . Carpal tunnel syndrome   . External hemorrhoids   . Rotator cuff syndrome   . 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 Gaylyn Rong  . 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 Hx of cardiovascular stress test     ETT-myoview (7/12): No evidence for ischemia or infarction  . External hemorrhoid   . Allergic rhinitis   . Eosinophilia 01/27/2013   History  Substance Use Topics  . Smoking status: Never Smoker   . Smokeless tobacco: Never Used   . Alcohol Use: No   ROS as above Medications reviewed. No current facility-administered medications for this encounter.   Current Outpatient Prescriptions  Medication Sig Dispense Refill  . acetaminophen (TYLENOL) 325 MG tablet Take 650 mg by mouth every 6 (six) hours as needed for pain.       Marland Kitchen allopurinol (ZYLOPRIM) 300 MG tablet Take 1 tablet (300 mg total) by mouth daily.  30 tablet  1  . aspirin EC 81 MG tablet Take 81 mg by mouth daily.      Marland Kitchen COUMADIN 2 MG tablet Take as directed by coumadin clinic  35 tablet  3  . folic acid (FOLVITE) 1 MG tablet Take 1 tablet (1 mg total) by mouth daily.  30 tablet  1  . furosemide (LASIX) 40 MG tablet Take 1.5 tablets (60 mg total) by mouth 2 (two) times daily.      Marland Kitchen loratadine (CLARITIN) 10 MG tablet Take 10 mg by mouth daily.      . metoprolol succinate (TOPROL XL) 50 MG 24 hr tablet 1 and 1/2 tablets daily (total 75mg ) daily.BRAND NAME ONLY  -per [place] Medicaid-[id number] [employer] Fail -BRAND NAME ONLY TOPROL XL 50mg  daily does not require prior authorization.  45 tablet  6  . metoprolol succinate (TOPROL XL) 50 MG 24 hr tablet Take 1 and 1/2 tab  daily  30 tablet  6  .  omeprazole (PRILOSEC) 20 MG capsule Take 1 capsule (20 mg total) by mouth daily before breakfast.  30 capsule  1  . potassium chloride SA (K-DUR,KLOR-CON) 20 MEQ tablet Take 20-40 mEq by mouth 2 (two) times daily. 2 tabs in the am, 1 tab in the pm      . simvastatin (ZOCOR) 10 MG tablet Take 1 tablet (10 mg total) by mouth 2 (two) times daily at 10 am and 4 pm.  60 tablet  1  . traMADol (ULTRAM) 50 MG tablet Take 1 tablet (50 mg total) by mouth at bedtime as needed for pain.  30 tablet  0    Exam:  BP 115/73  Pulse 74  Temp(Src) 98.1 F (36.7 C) (Oral)  Resp 16  SpO2 96% Gen: Well NAD HEENT: EOMI,  MMM Lungs: CTABL Nl WOB Heart: RRR no MRG Abd: NABS, NT, ND Exts: Non edematous BL  LE, warm and well perfused.  Skin: Small erythematous macule on right upper arm otherwise  normal  No results found for this or any previous visit (from the past 24 hour(s)). No results found.  Assessment and Plan: 62 y.o. female with  1) medication refill for gout, hyperlipidemia, hand pain, GERD. We'll obtain a uric acid, BMP, direct LDL. I have asked the patient to call Department of Social Services to get her Medicaid card change to her primary care provider so that she may continue seeing her primary care provider. Followup as needed 2) dermatitis: Small mild itchy papule on right arm. Recommended hydrocortisone cream. Followup as needed Discussed warning signs or symptoms. Please see discharge instructions. Patient expresses understanding.       Rodolph Bong, MD 04/30/13 [phone]

## 2013-04-30 NOTE — ED Notes (Signed)
Pt is here with complaints of generalized itching over entire body.  Pt reports this is a chronic problem and that she has been treated for this here before.  Pt is also requesting medication refills.

## 2013-05-02 ENCOUNTER — Telehealth: Payer: Self-pay | Admitting: Physician Assistant

## 2013-05-02 NOTE — Telephone Encounter (Signed)
Pt called answering service Saturday with question about her Lasix. She has been taking Lasix 40mg  1.5 tablets BID which correlates with last office note. [facility] had [facility] prescription listed as 40mg  1.5 tablets daily on file. She asked if I could call the pharmacy to update their records. I called [facility] and spoke with the pharmacist, and clarified her dose is Lasix 40mg  1.5 tablets BID - authorized 90 tabs, 2 refills. Pt has 4 month followup appt coming up soon. Lillie Portner PA-C

## 2013-05-25 ENCOUNTER — Other Ambulatory Visit: Payer: Self-pay | Admitting: Cardiology

## 2013-05-26 ENCOUNTER — Other Ambulatory Visit: Payer: Self-pay | Admitting: Cardiology

## 2013-05-27 ENCOUNTER — Ambulatory Visit (INDEPENDENT_AMBULATORY_CARE_PROVIDER_SITE_OTHER): Payer: Medicaid Other | Admitting: Pharmacist

## 2013-05-27 DIAGNOSIS — Z7901 Long term (current) use of anticoagulants: Secondary | ICD-10-CM

## 2013-05-27 DIAGNOSIS — Z954 Presence of other heart-valve replacement: Secondary | ICD-10-CM

## 2013-05-27 DIAGNOSIS — I059 Rheumatic mitral valve disease, unspecified: Secondary | ICD-10-CM

## 2013-05-27 LAB — POCT INR: INR: 3

## 2013-05-29 ENCOUNTER — Other Ambulatory Visit: Payer: Self-pay | Admitting: Cardiology

## 2013-06-16 ENCOUNTER — Other Ambulatory Visit (HOSPITAL_BASED_OUTPATIENT_CLINIC_OR_DEPARTMENT_OTHER): Payer: Medicaid Other | Admitting: Lab

## 2013-06-16 ENCOUNTER — Other Ambulatory Visit: Payer: Self-pay | Admitting: Internal Medicine

## 2013-06-16 DIAGNOSIS — D509 Iron deficiency anemia, unspecified: Secondary | ICD-10-CM

## 2013-06-16 LAB — CBC WITH DIFFERENTIAL/PLATELET
Basophils Absolute: 0.1 10*3/uL (ref 0.0–0.1)
Eosinophils Absolute: 0.5 10*3/uL (ref 0.0–0.5)
HCT: 35.2 % (ref 34.8–46.6)
HGB: 11 g/dL — ABNORMAL LOW (ref 11.6–15.9)
LYMPH%: 21.1 % (ref 14.0–49.7)
MONO#: 0.7 10*3/uL (ref 0.1–0.9)
NEUT%: 60.4 % (ref 38.4–76.8)
Platelets: 238 10*3/uL (ref 145–400)
WBC: 6.9 10*3/uL (ref 3.9–10.3)
lymph#: 1.5 10*3/uL (ref 0.9–3.3)

## 2013-06-29 ENCOUNTER — Encounter: Payer: Self-pay | Admitting: Cardiology

## 2013-06-29 ENCOUNTER — Other Ambulatory Visit: Payer: Medicaid Other

## 2013-06-29 ENCOUNTER — Ambulatory Visit (INDEPENDENT_AMBULATORY_CARE_PROVIDER_SITE_OTHER): Payer: Medicaid Other | Admitting: *Deleted

## 2013-06-29 ENCOUNTER — Ambulatory Visit (INDEPENDENT_AMBULATORY_CARE_PROVIDER_SITE_OTHER): Payer: Medicaid Other | Admitting: Cardiology

## 2013-06-29 VITALS — BP 116/80 | HR 79 | Ht 59.0 in | Wt 154.2 lb

## 2013-06-29 DIAGNOSIS — I059 Rheumatic mitral valve disease, unspecified: Secondary | ICD-10-CM

## 2013-06-29 DIAGNOSIS — Z7901 Long term (current) use of anticoagulants: Secondary | ICD-10-CM

## 2013-06-29 DIAGNOSIS — Z954 Presence of other heart-valve replacement: Secondary | ICD-10-CM

## 2013-06-29 DIAGNOSIS — I509 Heart failure, unspecified: Secondary | ICD-10-CM

## 2013-06-29 DIAGNOSIS — I5032 Chronic diastolic (congestive) heart failure: Secondary | ICD-10-CM

## 2013-06-29 DIAGNOSIS — R0602 Shortness of breath: Secondary | ICD-10-CM

## 2013-06-29 DIAGNOSIS — I4891 Unspecified atrial fibrillation: Secondary | ICD-10-CM

## 2013-06-29 DIAGNOSIS — I482 Chronic atrial fibrillation, unspecified: Secondary | ICD-10-CM

## 2013-06-29 MED ORDER — SIMVASTATIN 10 MG PO TABS: 10.0000 mg | ORAL_TABLET | ORAL | Status: DC

## 2013-06-29 MED ORDER — FUROSEMIDE 40 MG PO TABS: 60.0000 mg | ORAL_TABLET | Freq: Two times a day (BID) | ORAL | Status: DC

## 2013-06-29 MED ORDER — METOPROLOL SUCCINATE ER 50 MG PO TB24: 50.0000 mg | ORAL_TABLET | Freq: Every day | ORAL | Status: DC

## 2013-06-29 MED ORDER — POTASSIUM CHLORIDE CRYS ER 20 MEQ PO TBCR: EXTENDED_RELEASE_TABLET | ORAL | Status: DC

## 2013-06-29 NOTE — Progress Notes (Signed)
Patient ID: Deborah [name], female   DOB: [birth year], 62 y.o.   MRN: [id number] PCP: Dala Dock, Dr. Jeanella Flattery  62 yo with rheumatic heart disease and MV replacement/TV repair, chronic atrial fibrillation, and chronic diastolic CHF presents for cardiology followup.  She has been generally stable recently.  She walks for exercise about 4 times a week.  She is short of breath walking up stairs but does ok on flat ground.  No chest pain, no lightheadedness or palpitations.  Weight is stable.  She is now taking 10 mg daily of Zocor because she got myalgias with 20 mg daily.    Labs (8/12): K 3.5, creatinine 0.98 Labs (10/12): K 3.8, creatinine 1.0, BNP 116 Labs (1/13): K 3.6, creatinine 0.9, LDL 85, HDL 62 Labs (9/13): K 3.9, creatinine 1.0 Labs (4/14): HCT 37.8 with elevated eosinophils, BNP 1094 => 82, K 3.6, creatinine 1.0 Labs (9/14): K 3.6, creatinine 0.89, hemoglobin 11  ECG: atrial fibrillation, LPFB  PMH: 1. Rheumatic heart disease: Status post mechanical MV replacement and tricuspid repair at [facility] in 2002.  Last echo 5/12 with EF 60-65%, mild AI, mechanical MV prosthesis with normal function, severe LAE, moderate RAE, moderate to severe TR, mildly elevated PA systolic pressure.  2. Chronic atrial fibrillation on coumadin.  Event montor (7/12-8/12) showed no high rate episodes (patient continuously in atrial fibrillation).  3. Chronic diastolic CHF 4. ETT-myoview (7/12): No evidence for ischemia or infarction.  5. Impaired fasting glucose 6. Hyperlipidemia  7. HTN 8. Gout 9. Anemia 10. External hemorrhoids.  11. 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   SH: Nonsmoker. Lives in [place].   FH: No premature CAD  ROS: All systems reviewed and negative except as per HPI.   Current Outpatient Prescriptions  Medication Sig Dispense Refill  . acetaminophen (TYLENOL) 325 MG tablet Take 650 mg by mouth every 6 (six)  hours as needed for pain.       Marland Kitchen allopurinol (ZYLOPRIM) 300 MG tablet Take 1 tablet (300 mg total) by mouth daily.  30 tablet  1  . aspirin EC 81 MG tablet Take 81 mg by mouth daily.      Marland Kitchen COUMADIN 2 MG tablet TAKE AS DIRECTED BY COUMADIN CLINIC  35 tablet  3  . folic acid (FOLVITE) 1 MG tablet Take 1 tablet (1 mg total) by mouth daily.  30 tablet  1  . furosemide (LASIX) 40 MG tablet Take 1.5 tablets (60 mg total) by mouth 2 (two) times daily.  90 tablet  4  . loratadine (CLARITIN) 10 MG tablet Take 10 mg by mouth daily.      . metoprolol succinate (TOPROL-XL) 50 MG 24 hr tablet Take 1 tablet (50 mg total) by mouth daily. Take 1 tablet once a day  30 tablet  4  . omeprazole (PRILOSEC) 20 MG capsule Take 1 capsule (20 mg total) by mouth daily before breakfast.  30 capsule  1  . potassium chloride SA (KLOR-CON M20) 20 MEQ tablet TAKE 2 TABLETS BY MOUTH EVERY MORNING AND 1 IN THE EVENING  90 tablet  4  . simvastatin (ZOCOR) 10 MG tablet Take 1 tablet (10 mg total) by mouth every other day.  15 tablet  4  . traMADol (ULTRAM) 50 MG tablet Take 1 tablet (50 mg total) by mouth at bedtime as needed for pain.  30 tablet  0   No current facility-administered medications for this visit.    BP  116/80  Pulse 79  Ht 4\' 11"  (1.499 m)  Wt 69.945 kg (154 lb 3.2 oz)  BMI 31.13 kg/m2  SpO2 93% General: NAD Neck: JVP 7 cm, no thyromegaly or thyroid nodule.  Lungs: Clear to auscultation bilaterally with normal respiratory effort. CV: Nondisplaced PMI.  Heart irregular S1/S2, no S3/S4, mechanical S1, 1/6 HSM LLSB.  No peripheral edema.  No carotid bruit.  Normal pedal pulses.  Abdomen: Soft, nontender, no hepatosplenomegaly, no distention.  Neurologic: Alert and oriented x 3.  Psych: Normal affect. Extremities: No clubbing or cyanosis.   Assessment/Plan: 1. Atrial fibrillation: Chronic.  Continue warfarin.  No palpitations currently (has had in the past).   2. Mechanical mitral valve: Continue  ASA/coumadin, INR goal 2.5-3.5.  Will need bridging with heparin/Lovenox if has to come off coumadin in the future for procedure.  3. Diastolic CHF: Chronic.  She looks euvolemic today.  Continue current Lasix and KCl.   4. Hyperlipidemia: Zocor decreased due to myalgias.  Followup in 3 months.     Marca Ancona 06/29/2013

## 2013-06-29 NOTE — Patient Instructions (Signed)
Your physician wants you to follow-up in: 3 months with Dr [name]. (February 2015). You will receive a reminder letter in the mail two months in advance. If you don't receive a letter, please call our office to schedule the follow-up appointment.   Your physician recommends that you return for lab work in: 3 months when you see Dr Frutoso Chase.

## 2013-07-02 ENCOUNTER — Other Ambulatory Visit: Payer: Self-pay

## 2013-07-08 DIAGNOSIS — I081 Rheumatic disorders of both mitral and tricuspid valves: Secondary | ICD-10-CM | POA: Insufficient documentation

## 2013-07-08 DIAGNOSIS — Z952 Presence of prosthetic heart valve: Secondary | ICD-10-CM | POA: Insufficient documentation

## 2013-07-24 ENCOUNTER — Telehealth: Payer: Self-pay | Admitting: Cardiology

## 2013-07-24 NOTE — Telephone Encounter (Signed)
Received request from Nurse fax box, documents faxed for surgical clearance. [name] Fax number: [phone] Attention: 07/24/13/KM

## 2013-07-27 ENCOUNTER — Ambulatory Visit (INDEPENDENT_AMBULATORY_CARE_PROVIDER_SITE_OTHER): Payer: Medicaid Other | Admitting: General Practice

## 2013-07-27 DIAGNOSIS — I059 Rheumatic mitral valve disease, unspecified: Secondary | ICD-10-CM

## 2013-07-27 DIAGNOSIS — Z7901 Long term (current) use of anticoagulants: Secondary | ICD-10-CM

## 2013-07-27 DIAGNOSIS — Z954 Presence of other heart-valve replacement: Secondary | ICD-10-CM

## 2013-07-30 ENCOUNTER — Telehealth: Payer: Self-pay | Admitting: Internal Medicine

## 2013-07-30 NOTE — Telephone Encounter (Signed)
Pt called to change 12/16 lb to 09/09/13 due to ins issues. Per pt the provider on her card will not release npi number to any of her specialists due to he has never seen her and is not able to see her. Per pt she s/w her case worker who is working to change the provider on the card to someone who will see her but this will not be done until January 2015. Pt also concerned about lbs after 09/09/13 and was instructed to just make sure she can keep the 09/09/13 appt and then we will address/adjust the remaining appts. Pt made aware that when she comes on 09/09/13 she needs to have the new card and make sure she had seen the provider on the card. Pt voices understanding. Message to desk nurse and [facility] care) re above.

## 2013-08-11 ENCOUNTER — Other Ambulatory Visit: Payer: Medicaid Other

## 2013-08-24 ENCOUNTER — Ambulatory Visit (INDEPENDENT_AMBULATORY_CARE_PROVIDER_SITE_OTHER): Payer: Medicaid Other

## 2013-08-24 DIAGNOSIS — Z954 Presence of other heart-valve replacement: Secondary | ICD-10-CM

## 2013-08-24 DIAGNOSIS — Z7901 Long term (current) use of anticoagulants: Secondary | ICD-10-CM

## 2013-08-24 DIAGNOSIS — I059 Rheumatic mitral valve disease, unspecified: Secondary | ICD-10-CM

## 2013-08-24 LAB — POCT INR: INR: 2.7

## 2013-09-09 ENCOUNTER — Other Ambulatory Visit: Payer: Medicaid Other

## 2013-09-09 ENCOUNTER — Other Ambulatory Visit: Payer: Self-pay | Admitting: Internal Medicine

## 2013-09-09 ENCOUNTER — Other Ambulatory Visit: Payer: Self-pay

## 2013-09-09 DIAGNOSIS — D509 Iron deficiency anemia, unspecified: Secondary | ICD-10-CM

## 2013-09-10 ENCOUNTER — Telehealth: Payer: Self-pay | Admitting: Internal Medicine

## 2013-09-14 ENCOUNTER — Ambulatory Visit (HOSPITAL_BASED_OUTPATIENT_CLINIC_OR_DEPARTMENT_OTHER): Payer: Medicaid Other

## 2013-09-14 DIAGNOSIS — D509 Iron deficiency anemia, unspecified: Secondary | ICD-10-CM

## 2013-09-14 LAB — CBC WITH DIFFERENTIAL/PLATELET
BASO%: 1 % (ref 0.0–2.0)
Basophils Absolute: 0.1 10*3/uL (ref 0.0–0.1)
EOS%: 5.9 % (ref 0.0–7.0)
Eosinophils Absolute: 0.4 10*3/uL (ref 0.0–0.5)
HCT: 36.9 % (ref 34.8–46.6)
HGB: 11.6 g/dL (ref 11.6–15.9)
LYMPH%: 24.1 % (ref 14.0–49.7)
MCH: 23.5 pg — AB (ref 25.1–34.0)
MCHC: 31.5 g/dL (ref 31.5–36.0)
MCV: 74.6 fL — AB (ref 79.5–101.0)
MONO#: 0.7 10*3/uL (ref 0.1–0.9)
MONO%: 10.2 % (ref 0.0–14.0)
NEUT#: 4 10*3/uL (ref 1.5–6.5)
NEUT%: 58.8 % (ref 38.4–76.8)
PLATELETS: 178 10*3/uL (ref 145–400)
RBC: 4.95 10*6/uL (ref 3.70–5.45)
RDW: 15.8 % — AB (ref 11.2–14.5)
WBC: 6.9 10*3/uL (ref 3.9–10.3)
lymph#: 1.6 10*3/uL (ref 0.9–3.3)

## 2013-09-14 LAB — COMPREHENSIVE METABOLIC PANEL (CC13)
ALBUMIN: 4 g/dL (ref 3.5–5.0)
ALK PHOS: 80 U/L (ref 40–150)
ALT: 28 U/L (ref 0–55)
AST: 31 U/L (ref 5–34)
Anion Gap: 10 mEq/L (ref 3–11)
BILIRUBIN TOTAL: 1.21 mg/dL — AB (ref 0.20–1.20)
BUN: 21.3 mg/dL (ref 7.0–26.0)
CO2: 26 mEq/L (ref 22–29)
Calcium: 8.8 mg/dL (ref 8.4–10.4)
Chloride: 107 mEq/L (ref 98–109)
Creatinine: 0.9 mg/dL (ref 0.6–1.1)
Glucose: 171 mg/dl — ABNORMAL HIGH (ref 70–140)
POTASSIUM: 3.7 meq/L (ref 3.5–5.1)
SODIUM: 143 meq/L (ref 136–145)
Total Protein: 6.9 g/dL (ref 6.4–8.3)

## 2013-09-18 ENCOUNTER — Other Ambulatory Visit: Payer: Self-pay | Admitting: Internal Medicine

## 2013-09-18 ENCOUNTER — Telehealth: Payer: Self-pay | Admitting: Internal Medicine

## 2013-09-18 NOTE — Telephone Encounter (Signed)
I spoke to the patient's daughter to report her albs collected on 09/14/2013.  Her hemoglobin is improved to 11.6.  Eosinophilia has resolved.  Total bili is down to 1.21.  Labs are reassuring.  She should follow-up in 2 months and have labs accordingly. She understood.

## 2013-09-30 ENCOUNTER — Other Ambulatory Visit (INDEPENDENT_AMBULATORY_CARE_PROVIDER_SITE_OTHER): Payer: Medicaid Other

## 2013-09-30 ENCOUNTER — Ambulatory Visit (INDEPENDENT_AMBULATORY_CARE_PROVIDER_SITE_OTHER): Payer: Medicaid Other | Admitting: Cardiology

## 2013-09-30 ENCOUNTER — Ambulatory Visit (INDEPENDENT_AMBULATORY_CARE_PROVIDER_SITE_OTHER): Payer: Medicaid Other | Admitting: *Deleted

## 2013-09-30 ENCOUNTER — Encounter: Payer: Self-pay | Admitting: Cardiology

## 2013-09-30 VITALS — BP 128/76 | HR 79 | Ht 59.0 in | Wt 150.0 lb

## 2013-09-30 DIAGNOSIS — I5032 Chronic diastolic (congestive) heart failure: Secondary | ICD-10-CM

## 2013-09-30 DIAGNOSIS — E785 Hyperlipidemia, unspecified: Secondary | ICD-10-CM

## 2013-09-30 DIAGNOSIS — I059 Rheumatic mitral valve disease, unspecified: Secondary | ICD-10-CM

## 2013-09-30 DIAGNOSIS — I482 Chronic atrial fibrillation, unspecified: Secondary | ICD-10-CM

## 2013-09-30 DIAGNOSIS — Z954 Presence of other heart-valve replacement: Secondary | ICD-10-CM

## 2013-09-30 DIAGNOSIS — Z5181 Encounter for therapeutic drug level monitoring: Secondary | ICD-10-CM | POA: Insufficient documentation

## 2013-09-30 DIAGNOSIS — Z7901 Long term (current) use of anticoagulants: Secondary | ICD-10-CM

## 2013-09-30 DIAGNOSIS — I509 Heart failure, unspecified: Secondary | ICD-10-CM

## 2013-09-30 DIAGNOSIS — I4891 Unspecified atrial fibrillation: Secondary | ICD-10-CM

## 2013-09-30 LAB — BASIC METABOLIC PANEL
BUN: 17 mg/dL (ref 6–23)
CALCIUM: 9.6 mg/dL (ref 8.4–10.5)
CHLORIDE: 103 meq/L (ref 96–112)
CO2: 30 meq/L (ref 19–32)
Creatinine, Ser: 0.8 mg/dL (ref 0.4–1.2)
GFR: 73.26 mL/min (ref >60.00)
GLUCOSE: 107 mg/dL — AB (ref 70–99)
Potassium: 3.7 mEq/L (ref 3.5–5.1)
SODIUM: 141 meq/L (ref 135–145)

## 2013-09-30 LAB — POCT INR: INR: 2.6

## 2013-09-30 NOTE — Progress Notes (Signed)
Patient ID: Deborah [name], female   DOB: 13-May-1951, 63 y.o.   MRN: [id number] PCP: Dala Dock  63 yo with rheumatic heart disease and MV replacement/TV repair, chronic atrial fibrillation, and chronic diastolic CHF presents for cardiology followup.  She has been generally stable recently.  She walks for exercise about 4 times a week.  She is short of breath walking up stairs but does ok on flat ground.  No lightheadedness or palpitations.  Occasional atypical chest pain.  Weight is down 4 lbs.    Labs (8/12): K 3.5, creatinine 0.98 Labs (10/12): K 3.8, creatinine 1.0, BNP 116 Labs (1/13): K 3.6, creatinine 0.9, LDL 85, HDL 62 Labs (9/13): K 3.9, creatinine 1.0 Labs (4/14): HCT 37.8 with elevated eosinophils, BNP 1094 => 82, K 3.6, creatinine 1.0 Labs (9/14): K 3.6, creatinine 0.89, hemoglobin 11 Labs (1/15): K 3.7, creatinine 0.9, HCT 36.9  ECG: atrial fibrillation, LPFB, mild QT prolongation  PMH: 1. Rheumatic heart disease: Status post mechanical MV replacement and tricuspid repair at [facility] in 2002.  Last echo 5/12 with EF 60-65%, mild AI, mechanical MV prosthesis with normal function, severe LAE, moderate RAE, moderate to severe TR, mildly elevated PA systolic pressure.  2. Chronic atrial fibrillation on coumadin.  Event monitor (7/12-8/12) showed no high rate episodes (patient continuously in atrial fibrillation).  3. Chronic diastolic CHF 4. ETT-myoview (7/12): No evidence for ischemia or infarction.  5. Impaired fasting glucose 6. Hyperlipidemia  7. HTN 8. Gout 9. Anemia 10. External hemorrhoids.  11. 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   SH: Nonsmoker. Lives in [place].   FH: No premature CAD  Current Outpatient Prescriptions  Medication Sig Dispense Refill  . acetaminophen (TYLENOL) 325 MG tablet Take 650 mg by mouth every 6 (six) hours as needed for pain.       Marland Kitchen allopurinol (ZYLOPRIM) 300 MG tablet  Take 1 tablet (300 mg total) by mouth daily.  30 tablet  1  . aspirin EC 81 MG tablet Take 81 mg by mouth daily.      Marland Kitchen COUMADIN 2 MG tablet TAKE AS DIRECTED BY COUMADIN CLINIC  35 tablet  3  . folic acid (FOLVITE) 1 MG tablet Take 1 tablet (1 mg total) by mouth daily.  30 tablet  1  . furosemide (LASIX) 40 MG tablet Take 1.5 tablets (60 mg total) by mouth 2 (two) times daily.  90 tablet  4  . loratadine (CLARITIN) 10 MG tablet Take 10 mg by mouth daily.      . metoprolol succinate (TOPROL-XL) 50 MG 24 hr tablet Take 1 tablet (50 mg total) by mouth daily. Take 1 tablet once a day  30 tablet  4  . omeprazole (PRILOSEC) 20 MG capsule Take 1 capsule (20 mg total) by mouth daily before breakfast.  30 capsule  1  . potassium chloride SA (KLOR-CON M20) 20 MEQ tablet TAKE 2 TABLETS BY MOUTH EVERY MORNING AND 1 IN THE EVENING  90 tablet  4  . simvastatin (ZOCOR) 10 MG tablet Take 1 tablet (10 mg total) by mouth every other day.  15 tablet  4  . traMADol (ULTRAM) 50 MG tablet Take 1 tablet (50 mg total) by mouth at bedtime as needed for pain.  30 tablet  0   No current facility-administered medications for this visit.    BP 128/76  Pulse 79  Ht 4\' 11"  (1.499 m)  Wt 150 lb (68.04 kg)  BMI  30.28 kg/m2 General: NAD Neck: JVP 7 cm, no thyromegaly or thyroid nodule.  Lungs: Clear to auscultation bilaterally with normal respiratory effort. CV: Nondisplaced PMI.  Heart irregular S1/S2, no S3/S4, mechanical S1, 1/6 HSM LLSB.  No peripheral edema.  No carotid bruit.  Normal pedal pulses.  Abdomen: Soft, nontender, no hepatosplenomegaly, no distention.  Neurologic: Alert and oriented x 3.  Psych: Normal affect. Extremities: No clubbing or cyanosis.   Assessment/Plan: 1. Atrial fibrillation: Chronic.  Continue warfarin.  Rate is controlled.    2. Mechanical mitral valve: Continue ASA/coumadin, INR goal 2.5-3.5.  Will need bridging with heparin/Lovenox if has to come off coumadin in the future for  procedure.  3. Diastolic CHF: Chronic.  She looks euvolemic today.  Continue current Lasix and KCl.   4. Hyperlipidemia: Check lipids today (on simvastatin).  Followup in 4 months.     Marca AnconaDalton Eulogio Requena 09/30/2013

## 2013-09-30 NOTE — Patient Instructions (Addendum)
Your physician recommends that you have lab work today--lipid profile/HGB A1c.  Your physician recommends that you schedule a follow-up appointment in: 4 months with Dr Shirlee LatchMcLean.

## 2013-10-07 ENCOUNTER — Telehealth: Payer: Self-pay | Admitting: Cardiology

## 2013-10-07 NOTE — Telephone Encounter (Signed)
New message         Pt would like her results of her lab from 09/30/13 and she wants to know if you can send those results by mail.

## 2013-10-07 NOTE — Telephone Encounter (Signed)
Pt states she had supposed to have Lipids and A1C done but blood was not drawn. Pt is scheduled for Lipids, HGB/A1C   tomorrow AM 10/08/13. Pt said that she is not in "MY chart"

## 2013-10-08 ENCOUNTER — Ambulatory Visit (INDEPENDENT_AMBULATORY_CARE_PROVIDER_SITE_OTHER): Payer: Medicaid Other | Admitting: *Deleted

## 2013-10-08 DIAGNOSIS — E785 Hyperlipidemia, unspecified: Secondary | ICD-10-CM

## 2013-10-08 DIAGNOSIS — R7309 Other abnormal glucose: Secondary | ICD-10-CM

## 2013-10-08 LAB — LIPID PANEL
CHOL/HDL RATIO: 4
CHOLESTEROL: 175 mg/dL (ref 0–200)
HDL: 49.7 mg/dL (ref >39.00)
LDL CALC: 99 mg/dL (ref 0–99)
TRIGLYCERIDES: 130 mg/dL (ref 0.0–149.0)
VLDL: 26 mg/dL (ref 0.0–40.0)

## 2013-10-08 LAB — HEMOGLOBIN A1C: Hgb A1c MFr Bld: 5.2 % (ref 4.6–6.5)

## 2013-10-12 ENCOUNTER — Telehealth: Payer: Self-pay | Admitting: Internal Medicine

## 2013-10-12 NOTE — Telephone Encounter (Signed)
pt called to r/s lab to wednesday...done

## 2013-10-13 ENCOUNTER — Other Ambulatory Visit: Payer: Self-pay | Admitting: Internal Medicine

## 2013-10-13 ENCOUNTER — Other Ambulatory Visit: Payer: Medicaid Other

## 2013-10-14 ENCOUNTER — Other Ambulatory Visit (HOSPITAL_BASED_OUTPATIENT_CLINIC_OR_DEPARTMENT_OTHER): Payer: Medicaid Other

## 2013-10-14 ENCOUNTER — Other Ambulatory Visit: Payer: Self-pay | Admitting: Medical Oncology

## 2013-10-14 DIAGNOSIS — D509 Iron deficiency anemia, unspecified: Secondary | ICD-10-CM

## 2013-10-14 LAB — CBC WITH DIFFERENTIAL/PLATELET
BASO%: 0.8 % (ref 0.0–2.0)
BASOS ABS: 0.1 10*3/uL (ref 0.0–0.1)
EOS%: 3.9 % (ref 0.0–7.0)
Eosinophils Absolute: 0.3 10*3/uL (ref 0.0–0.5)
HCT: 40.2 % (ref 34.8–46.6)
HEMOGLOBIN: 12.5 g/dL (ref 11.6–15.9)
LYMPH#: 2.1 10*3/uL (ref 0.9–3.3)
LYMPH%: 31 % (ref 14.0–49.7)
MCH: 23.4 pg — AB (ref 25.1–34.0)
MCHC: 31.2 g/dL — AB (ref 31.5–36.0)
MCV: 75.1 fL — AB (ref 79.5–101.0)
MONO#: 0.6 10*3/uL (ref 0.1–0.9)
MONO%: 9.1 % (ref 0.0–14.0)
NEUT#: 3.7 10*3/uL (ref 1.5–6.5)
NEUT%: 55.2 % (ref 38.4–76.8)
Platelets: 169 10*3/uL (ref 145–400)
RBC: 5.35 10*6/uL (ref 3.70–5.45)
RDW: 16.7 % — AB (ref 11.2–14.5)
WBC: 6.8 10*3/uL (ref 3.9–10.3)

## 2013-10-28 ENCOUNTER — Ambulatory Visit (INDEPENDENT_AMBULATORY_CARE_PROVIDER_SITE_OTHER): Payer: Medicaid Other | Admitting: *Deleted

## 2013-10-28 DIAGNOSIS — Z5181 Encounter for therapeutic drug level monitoring: Secondary | ICD-10-CM

## 2013-10-28 DIAGNOSIS — I059 Rheumatic mitral valve disease, unspecified: Secondary | ICD-10-CM

## 2013-10-28 DIAGNOSIS — Z7901 Long term (current) use of anticoagulants: Secondary | ICD-10-CM

## 2013-10-28 DIAGNOSIS — Z954 Presence of other heart-valve replacement: Secondary | ICD-10-CM

## 2013-10-28 LAB — POCT INR: INR: 2.1

## 2013-11-12 ENCOUNTER — Telehealth: Payer: Self-pay | Admitting: Internal Medicine

## 2013-11-12 ENCOUNTER — Ambulatory Visit (HOSPITAL_BASED_OUTPATIENT_CLINIC_OR_DEPARTMENT_OTHER): Payer: Medicaid Other | Admitting: Internal Medicine

## 2013-11-12 ENCOUNTER — Other Ambulatory Visit (HOSPITAL_BASED_OUTPATIENT_CLINIC_OR_DEPARTMENT_OTHER): Payer: Medicaid Other

## 2013-11-12 ENCOUNTER — Other Ambulatory Visit: Payer: Self-pay | Admitting: Internal Medicine

## 2013-11-12 VITALS — BP 120/75 | HR 75 | Temp 97.9°F | Resp 18 | Ht 59.0 in | Wt 154.2 lb

## 2013-11-12 DIAGNOSIS — D721 Eosinophilia, unspecified: Secondary | ICD-10-CM

## 2013-11-12 DIAGNOSIS — D560 Alpha thalassemia: Secondary | ICD-10-CM

## 2013-11-12 DIAGNOSIS — D509 Iron deficiency anemia, unspecified: Secondary | ICD-10-CM

## 2013-11-12 DIAGNOSIS — D582 Other hemoglobinopathies: Secondary | ICD-10-CM

## 2013-11-12 LAB — CBC WITH DIFFERENTIAL/PLATELET
BASO%: 1.2 % (ref 0.0–2.0)
Basophils Absolute: 0.1 10*3/uL (ref 0.0–0.1)
EOS ABS: 0.2 10*3/uL (ref 0.0–0.5)
EOS%: 2.3 % (ref 0.0–7.0)
HCT: 36.3 % (ref 34.8–46.6)
HGB: 11.5 g/dL — ABNORMAL LOW (ref 11.6–15.9)
LYMPH%: 26.2 % (ref 14.0–49.7)
MCH: 23.9 pg — ABNORMAL LOW (ref 25.1–34.0)
MCHC: 31.7 g/dL (ref 31.5–36.0)
MCV: 75.4 fL — ABNORMAL LOW (ref 79.5–101.0)
MONO#: 0.7 10*3/uL (ref 0.1–0.9)
MONO%: 11.1 % (ref 0.0–14.0)
NEUT%: 59.2 % (ref 38.4–76.8)
NEUTROS ABS: 3.9 10*3/uL (ref 1.5–6.5)
PLATELETS: 153 10*3/uL (ref 145–400)
RBC: 4.81 10*6/uL (ref 3.70–5.45)
RDW: 16.6 % — AB (ref 11.2–14.5)
WBC: 6.6 10*3/uL (ref 3.9–10.3)
lymph#: 1.7 10*3/uL (ref 0.9–3.3)

## 2013-11-12 LAB — COMPREHENSIVE METABOLIC PANEL (CC13)
ALBUMIN: 4 g/dL (ref 3.5–5.0)
ALK PHOS: 74 U/L (ref 40–150)
ALT: 28 U/L (ref 0–55)
AST: 26 U/L (ref 5–34)
Anion Gap: 9 mEq/L (ref 3–11)
BILIRUBIN TOTAL: 1.28 mg/dL — AB (ref 0.20–1.20)
BUN: 25.4 mg/dL (ref 7.0–26.0)
CO2: 28 meq/L (ref 22–29)
Calcium: 9.6 mg/dL (ref 8.4–10.4)
Chloride: 106 mEq/L (ref 98–109)
Creatinine: 0.9 mg/dL (ref 0.6–1.1)
Glucose: 116 mg/dl (ref 70–140)
POTASSIUM: 4 meq/L (ref 3.5–5.1)
SODIUM: 143 meq/L (ref 136–145)
TOTAL PROTEIN: 7.1 g/dL (ref 6.4–8.3)

## 2013-11-12 NOTE — Telephone Encounter (Signed)
gv adn printed appt sched and avs for pt for June, Sept, and Dec

## 2013-11-12 NOTE — Progress Notes (Signed)
[facility] OFFICE PROGRESS NOTE  [name], Deborah Abed, MD [address] [place] [address]  DIAGNOSIS: Microcytic anemia - Plan: CBC with Differential, Comprehensive metabolic panel (Cmet) - [facility], CBC with Differential, CBC with Differential  Eosinophilia - Plan: CBC with Differential, Comprehensive metabolic panel (Cmet) - [facility], CBC with Differential, CBC with Differential  Chief Complaint  Patient presents with  . Eosinophilia    CURRENT TREATMENT:   No history exists.     INTERVAL HISTORY: Deborah [name] 63 y.o. female with a history of Hgb E and Hgb Constant Spring without anemia and slight eosinophilia is here for a 56-month follow-up.  She was last seen by Dr. Gaylyn Rong on 02/12/2013. She reports her mammogram last year was within normal limits.  She is scheduled for a colonoscopy by Dr. Dorena Cookey this year.  She denies ER visits or hospitalizations.  She reports occasional mild dizziness and headaches.  She feels well overall.  She has had an intentional weight lost of 10 lbs due to increased exercises including daily walking.   In 11/2012, she developed nonpapular, erythematous rash throughout her body. She then developed hives. She presented to ED and was thought to have urosepsis and pneumonia. She had sepsis physiology and was in ICU. Since discharge, she has not noticed any skin rash/hive.   Patient currently denies fever, anorexia, weight loss, headache, visual changes, confusion, drenching night sweats, palpable lymph node swelling, mucositis, odynophagia, dysphagia, nausea vomiting, jaundice, chest pain, palpitation, shortness of breath, dyspnea on exertion, productive cough, gum bleeding, epistaxis, hematemesis, hemoptysis, abdominal pain, abdominal swelling, early satiety, melena, hematochezia, hematuria, skin rash, spontaneous bleeding, joint swelling, joint pain, heat or cold intolerance, bowel bladder incontinence, back pain, focal motor weakness,  paresthesia.   MEDICAL HISTORY: Past Medical History  Diagnosis Date  . Rheumatic heart disease     Status post mechanical ([employer]) mitral valve replacement and tricuspid valve repair at [facility] in 2002; echo 5/12:   EF 60-65%, mild AI, mitral valve prosthesis with AVA 1.66, severe LAE, moderate RAE, moderate to severe TR, mild increased pulmonary artery systolic pressure;    Adenosine Cardiolite in 4/09:   No ischemia, EF 66%  . Chronic atrial fibrillation     coumadin managed by [facility]. CVRR;  Event montor (7/12-8/12) showed no high rate episodes (patient continuously in atrial fibrillation).   . Chronic diastolic heart failure   . Fatigue   . Borderline diabetes   . Hyperlipidemia   . Obesity   . Anemia   . Gout   . Hypertension   . Carpal tunnel syndrome   . External hemorrhoids   . Rotator cuff syndrome   . 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 Gaylyn Rong  . 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 Hx of cardiovascular stress test     ETT-myoview (7/12): No evidence for ischemia or infarction  . External hemorrhoid   . Allergic rhinitis   . Eosinophilia 01/27/2013    INTERIM HISTORY: has HYPERLIPIDEMIA; GOUT; CARPAL TUNNEL SYNDROME, LEFT; TRICUSPID REGURGITATION, MODERATE WITH MILD PULMONARY HTN; GERD; HYPERBILIRUBINEMIA; HYPERGLYCEMIA; s/p Mechanical MV replacement ; Rheumatic heart disease; Chronic atrial fibrillation; Chronic anticoagulation; Diabetes mellitus; Hyperlipidemia; Diastolic CHF, chronic; Microcytic anemia; CAP (community acquired pneumonia); Hypotension; SIRS (systemic inflammatory response syndrome); Eosinophilia; and Encounter for therapeutic drug monitoring on her problem list.    ALLERGIES:  is allergic to promethazine hcl and promethazine hcl.  MEDICATIONS: has a current medication list which includes the following prescription(s):  acetaminophen, allopurinol, aspirin ec, coumadin, folic acid, furosemide, loratadine, metoprolol succinate, omeprazole, potassium  chloride sa, and simvastatin.  SURGICAL HISTORY:  Past Surgical History  Procedure Laterality Date  . Tricuspid valve repair    . Cardiac catheterization      EF 55%    REVIEW OF SYSTEMS:   Constitutional: Denies fevers, chills or abnormal weight loss Eyes: Denies blurriness of vision Ears, nose, mouth, throat, and face: Denies mucositis or sore throat Respiratory: Denies cough, dyspnea or wheezes Cardiovascular: Denies palpitation, chest discomfort or lower extremity swelling Gastrointestinal:  Denies nausea, heartburn or change in bowel habits Skin: Denies abnormal skin rashes Lymphatics: Denies new lymphadenopathy or easy bruising Neurological:Denies numbness, tingling or new weaknesses Behavioral/Psych: Mood is stable, no new changes  All other systems were reviewed with the patient and are negative.  PHYSICAL EXAMINATION: ECOG PERFORMANCE STATUS: 1 - Symptomatic but completely ambulatory  Blood pressure 120/75, pulse 75, temperature 97.9 F (36.6 C), temperature source Oral, resp. rate 18, height 4\' 11"  (1.499 m), weight 154 lb 3.2 oz (69.945 kg), SpO2 98.00%.  GENERAL:alert, no distress and comfortable SKIN: skin color, texture, turgor are normal, no rashes or significant lesions EYES: normal, Conjunctiva are pink and non-injected, sclera clear OROPHARYNX:no exudate, no erythema and lips, buccal mucosa, and tongue normal  NECK: supple, thyroid normal size, non-tender, without nodularity LYMPH:  no palpable lymphadenopathy in the cervical, axillary or supraclavicular LUNGS: clear to auscultation with normal breathing effort, no wheezes or rhonchi HEART: regular rate & rhythm and no murmurs and no lower extremity edema ABDOMEN:abdomen soft, non-tender and normal bowel sounds Musculoskeletal:no cyanosis of digits and no clubbing  NEURO: alert & oriented x 3 with fluent speech, no focal motor/sensory deficits  Labs:  Lab Results  Component Value Date   WBC 6.6 11/12/2013    HGB 11.5* 11/12/2013   HCT 36.3 11/12/2013   MCV 75.4* 11/12/2013   PLT 153 11/12/2013   NEUTROABS 3.9 11/12/2013      Chemistry      Component Value Date/Time   NA 143 11/12/2013 0818   NA 141 09/30/2013 1051   K 4.0 11/12/2013 0818   K 3.7 09/30/2013 1051   CL 103 09/30/2013 1051   CL 108* 02/12/2013 0813   CO2 28 11/12/2013 0818   CO2 30 09/30/2013 1051   BUN 25.4 11/12/2013 0818   BUN 17 09/30/2013 1051   CREATININE 0.9 11/12/2013 0818   CREATININE 0.8 09/30/2013 1051      Component Value Date/Time   CALCIUM 9.6 11/12/2013 0818   CALCIUM 9.6 09/30/2013 1051   ALKPHOS 74 11/12/2013 0818   ALKPHOS 56 09/24/2011 1121   AST 26 11/12/2013 0818   AST 25 09/24/2011 1121   ALT 28 11/12/2013 0818   ALT 18 09/24/2011 1121   BILITOT 1.28* 11/12/2013 0818   BILITOT 2.5* 09/24/2011 1121       Basic Metabolic Panel:  Recent Labs Lab 11/12/13 0818  NA 143  K 4.0  CO2 28  GLUCOSE 116  BUN 25.4  CREATININE 0.9  CALCIUM 9.6   GFR Estimated Creatinine Clearance: 55.9 ml/min (by C-G formula based on Cr of 0.9). Liver Function Tests:  Recent Labs Lab 11/12/13 0818  AST 26  ALT 28  ALKPHOS 74  BILITOT 1.28*  PROT 7.1  ALBUMIN 4.0   No results found for this basename: LIPASE, AMYLASE,  in the last 168 hours No results found for this basename: AMMONIA,  in the last 168 hours Coagulation profile No  results found for this basename: INR, PROTIME,  in the last 168 hours  CBC:  Recent Labs Lab 11/12/13 0818  WBC 6.6  NEUTROABS 3.9  HGB 11.5*  HCT 36.3  MCV 75.4*  PLT 153    Anemia work up No results found for this basename: VITAMINB12, FOLATE, FERRITIN, TIBC, IRON, RETICCTPCT,  in the last 72 hours  Studies:  No results found.   RADIOGRAPHIC STUDIES: No results found.  ASSESSMENT: Deborah [name] 63 y.o. female with a history of Microcytic anemia - Plan: CBC with Differential, Comprehensive metabolic panel (Cmet) - [facility], CBC with Differential, CBC with  Differential  Eosinophilia - Plan: CBC with Differential, Comprehensive metabolic panel (Cmet) - [facility], CBC with Differential, CBC with Differential   PLAN:   1. Hemoglobin E and Constant Spring:  - stable. 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 No transfusion is indicated. I informed her daughter who accompanied her today that this diagnosis is autosomal dominant. If patient's children have anemia, they should be screened for this diagnosis.   2. Transient elevated eosinophil, resolved:  Most likely due to acute infection in April 2014. Since then, her eosinophilia has almost normalized. There is low concern for primary bone marrow disease such as hypereosinophilic syndrome or myeloproliferative disease. I recommended to continue observation with serial CBC check every 3 months. If her eosinophilia significantly worsens, then further work up may be considered.   3. Follow up: In about 9 months but sooner if she has concerning symptoms.   All questions were answered. The patient knows to call the clinic with any problems, questions or concerns. We can certainly see the patient much sooner if necessary.  I spent 15 minutes counseling the patient face to face. The total time spent in the appointment was 25 minutes.    Kelsen Celona, MD 11/15/2013 12:38 PM

## 2013-11-18 ENCOUNTER — Ambulatory Visit (INDEPENDENT_AMBULATORY_CARE_PROVIDER_SITE_OTHER): Payer: Medicaid Other | Admitting: *Deleted

## 2013-11-18 DIAGNOSIS — Z5181 Encounter for therapeutic drug level monitoring: Secondary | ICD-10-CM

## 2013-11-18 DIAGNOSIS — Z954 Presence of other heart-valve replacement: Secondary | ICD-10-CM

## 2013-11-18 DIAGNOSIS — I059 Rheumatic mitral valve disease, unspecified: Secondary | ICD-10-CM

## 2013-11-18 DIAGNOSIS — Z7901 Long term (current) use of anticoagulants: Secondary | ICD-10-CM

## 2013-11-18 LAB — POCT INR: INR: 2.8

## 2013-11-26 ENCOUNTER — Other Ambulatory Visit: Payer: Self-pay | Admitting: Cardiology

## 2013-11-30 ENCOUNTER — Other Ambulatory Visit: Payer: Self-pay | Admitting: Cardiology

## 2013-12-15 ENCOUNTER — Other Ambulatory Visit: Payer: Medicaid Other

## 2013-12-16 ENCOUNTER — Ambulatory Visit (INDEPENDENT_AMBULATORY_CARE_PROVIDER_SITE_OTHER): Payer: Medicaid Other | Admitting: *Deleted

## 2013-12-16 DIAGNOSIS — Z5181 Encounter for therapeutic drug level monitoring: Secondary | ICD-10-CM

## 2013-12-16 DIAGNOSIS — Z7901 Long term (current) use of anticoagulants: Secondary | ICD-10-CM

## 2013-12-16 DIAGNOSIS — I059 Rheumatic mitral valve disease, unspecified: Secondary | ICD-10-CM

## 2013-12-16 DIAGNOSIS — Z954 Presence of other heart-valve replacement: Secondary | ICD-10-CM

## 2013-12-16 LAB — POCT INR: INR: 3.5

## 2013-12-27 ENCOUNTER — Other Ambulatory Visit: Payer: Self-pay | Admitting: Cardiology

## 2013-12-29 ENCOUNTER — Other Ambulatory Visit: Payer: Self-pay

## 2013-12-29 MED ORDER — FOLIC ACID 1 MG PO TABS: 1.0000 mg | ORAL_TABLET | Freq: Every day | ORAL | Status: DC

## 2013-12-30 ENCOUNTER — Other Ambulatory Visit: Payer: Self-pay | Admitting: *Deleted

## 2013-12-30 MED ORDER — ALLOPURINOL 300 MG PO TABS: 300.0000 mg | ORAL_TABLET | Freq: Every day | ORAL | Status: DC

## 2014-01-04 ENCOUNTER — Encounter: Payer: Self-pay | Admitting: *Deleted

## 2014-01-11 ENCOUNTER — Ambulatory Visit (INDEPENDENT_AMBULATORY_CARE_PROVIDER_SITE_OTHER): Payer: Medicaid Other | Admitting: *Deleted

## 2014-01-11 ENCOUNTER — Encounter: Payer: Self-pay | Admitting: Cardiology

## 2014-01-11 ENCOUNTER — Ambulatory Visit (INDEPENDENT_AMBULATORY_CARE_PROVIDER_SITE_OTHER): Payer: Medicaid Other | Admitting: Cardiology

## 2014-01-11 VITALS — BP 124/64 | HR 68 | Ht 59.0 in | Wt 156.0 lb

## 2014-01-11 DIAGNOSIS — I059 Rheumatic mitral valve disease, unspecified: Secondary | ICD-10-CM

## 2014-01-11 DIAGNOSIS — I509 Heart failure, unspecified: Secondary | ICD-10-CM

## 2014-01-11 DIAGNOSIS — R0602 Shortness of breath: Secondary | ICD-10-CM

## 2014-01-11 DIAGNOSIS — Z5181 Encounter for therapeutic drug level monitoring: Secondary | ICD-10-CM

## 2014-01-11 DIAGNOSIS — I482 Chronic atrial fibrillation, unspecified: Secondary | ICD-10-CM

## 2014-01-11 DIAGNOSIS — I5032 Chronic diastolic (congestive) heart failure: Secondary | ICD-10-CM

## 2014-01-11 DIAGNOSIS — J189 Pneumonia, unspecified organism: Secondary | ICD-10-CM

## 2014-01-11 DIAGNOSIS — I4891 Unspecified atrial fibrillation: Secondary | ICD-10-CM

## 2014-01-11 DIAGNOSIS — Z954 Presence of other heart-valve replacement: Secondary | ICD-10-CM

## 2014-01-11 DIAGNOSIS — I079 Rheumatic tricuspid valve disease, unspecified: Secondary | ICD-10-CM

## 2014-01-11 DIAGNOSIS — Z7901 Long term (current) use of anticoagulants: Secondary | ICD-10-CM

## 2014-01-11 LAB — POCT INR: INR: 4.3

## 2014-01-11 MED ORDER — POTASSIUM CHLORIDE CRYS ER 20 MEQ PO TBCR: 40.0000 meq | EXTENDED_RELEASE_TABLET | Freq: Two times a day (BID) | ORAL | Status: DC

## 2014-01-11 MED ORDER — FUROSEMIDE 80 MG PO TABS
80.0000 mg | ORAL_TABLET | Freq: Every day | ORAL | Status: DC
Start: 2014-01-11 — End: 2014-02-02

## 2014-01-11 NOTE — Progress Notes (Signed)
Patient ID: Deborah [name], female   DOB: 09/04/1950, 63 y.o.   MRN: [id number] [facility]  63 yo with rheumatic heart disease and MV replacement/TV repair, chronic atrial fibrillation, and chronic diastolic CHF presents for cardiology followup.  Weight is up 6 lbs since last appointment.  She is short of breath walking up steps or an incline.  She sleeps on 2 pillows.  She is ok walking on flat ground or walking on her treadmill.  She has stabbing chest pain that is not exertional and lasts for about a second at a time.  It seems like her dyspnea is worse than in the past.     Labs (8/12): K 3.5, creatinine 0.98 Labs (10/12): K 3.8, creatinine 1.0, BNP 116 Labs (1/13): K 3.6, creatinine 0.9, LDL 85, HDL 62 Labs (9/13): K 3.9, creatinine 1.0 Labs (4/14): HCT 37.8 with elevated eosinophils, BNP 1094 => 82, K 3.6, creatinine 1.0 Labs (9/14): K 3.6, creatinine 0.89, hemoglobin 11 Labs (1/15): K 3.7, creatinine 0.9, HCT 36.9 Labs (2/15): LDL 99, HDL 50 Labs (3/15): K 4, creatinine 1.28  PMH: 1. Rheumatic heart disease: Status post mechanical MV replacement and tricuspid repair at [facility] in 2002.  Last echo 5/12 with EF 60-65%, mild AI, mechanical MV prosthesis with normal function, severe LAE, moderate RAE, moderate to severe TR, mildly elevated PA systolic pressure.  2. Chronic atrial fibrillation on coumadin.  Event monitor (7/12-8/12) showed no high rate episodes (patient continuously in atrial fibrillation).  3. Chronic diastolic CHF 4. ETT-myoview (7/12): No evidence for ischemia or infarction.  5. Impaired fasting glucose 6. Hyperlipidemia  7. HTN 8. Gout 9. Anemia 10. External hemorrhoids.  11. 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   SH: Nonsmoker. [place] in [place]   FH: No premature CAD  ROS: All systems reviewed and negative except as noted in HPI.    Current Outpatient Prescriptions  Medication Sig  Dispense Refill  . acetaminophen (TYLENOL) 325 MG tablet Take 650 mg by mouth every 6 (six) hours as needed for pain.       Marland Kitchen. allopurinol (ZYLOPRIM) 300 MG tablet Take 1 tablet (300 mg total) by mouth daily.  30 tablet  0  . aspirin EC 81 MG tablet Take 81 mg by mouth daily.      . folic acid (FOLVITE) 1 MG tablet Take 1 tablet (1 mg total) by mouth daily.  30 tablet  1  . KLOR-CON M20 20 MEQ tablet TAKE 2 TABLETS BY MOUTH EVERY MORNING AND 1 IN THE EVENING  90 tablet  1  . metoprolol succinate (TOPROL XL) 50 MG 24 hr tablet Take 1 tablet (50 mg total) by mouth daily.  30 tablet  1  . metoprolol succinate (TOPROL-XL) 50 MG 24 hr tablet Take 1 tablet (50 mg total) by mouth daily. Take 1 tablet once a day  30 tablet  4  . omeprazole (PRILOSEC) 20 MG capsule Take 1 capsule (20 mg total) by mouth daily before breakfast.  30 capsule  1  . simvastatin (ZOCOR) 10 MG tablet Take 1 tablet (10 mg total) by mouth every other day.  15 tablet  4  . warfarin (COUMADIN) 2 MG tablet 1 tablet everyday except 1/2 tablet on Mondays and Wednesdays or as directed by coumadin clinic  35 tablet  3  . furosemide (LASIX) 80 MG tablet Take 1 tablet (80 mg total) by mouth daily.  90 tablet  3  .  potassium chloride SA (K-DUR,KLOR-CON) 20 MEQ tablet Take 2 tablets (40 mEq total) by mouth 2 (two) times daily.  90 tablet  3   No current facility-administered medications for this visit.    BP 124/64  Pulse 68  Ht 4\' 11"  (1.499 m)  Wt 70.761 kg (156 lb)  BMI 31.49 kg/m2 General: NAD Neck: JVP 9-10 cm, no thyromegaly or thyroid nodule.  Lungs: Clear to auscultation bilaterally with normal respiratory effort. CV: Nondisplaced PMI.  Heart irregular S1/S2, no S3/S4, mechanical S1, 2/6 HSM LLSB.  No peripheral edema.  No carotid bruit.  Normal pedal pulses.  Abdomen: Soft, nontender, no hepatosplenomegaly, no distention.  Neurologic: Alert and oriented x 3.  Psych: Normal affect. Extremities: No clubbing or cyanosis.    Assessment/Plan: 1. Atrial fibrillation: Chronic.  Continue warfarin.  Rate is controlled.    2. Mechanical mitral valve: Continue ASA/coumadin, INR goal 2.5-3.5.  Will need bridging with heparin/Lovenox if has to come off coumadin in the future for procedure.  3. Diastolic CHF: She is volume overloaded today.   - Increase Lasix to 80 mg bid.  Increase KCl to 40 mEq bid.  - Echo to reassess EF and valvular disease.  - BMET/BNP in 10 days.  - Followup in 3-4 wks at CHF clinic.  4. Hyperlipidemia: Continue simvastatin.    Laurey MoraleDalton S Treshon Stannard 01/11/2014

## 2014-01-11 NOTE — Patient Instructions (Signed)
INCREASE YOUR LASIX TO 80 MG 2 TIMES DAILY  INCREASE YOUR POTASSIUM TO 40 MG TWO TIMES DAILY  COME FOR LAB WORK IN 10 DAYS (BMET AND BNP)  [facility] ON A DAY HE IS THERE

## 2014-01-21 ENCOUNTER — Other Ambulatory Visit (INDEPENDENT_AMBULATORY_CARE_PROVIDER_SITE_OTHER): Payer: Medicaid Other

## 2014-01-21 DIAGNOSIS — I4891 Unspecified atrial fibrillation: Secondary | ICD-10-CM

## 2014-01-21 DIAGNOSIS — I5032 Chronic diastolic (congestive) heart failure: Secondary | ICD-10-CM

## 2014-01-21 DIAGNOSIS — I482 Chronic atrial fibrillation, unspecified: Secondary | ICD-10-CM

## 2014-01-21 DIAGNOSIS — I509 Heart failure, unspecified: Secondary | ICD-10-CM

## 2014-01-21 DIAGNOSIS — J189 Pneumonia, unspecified organism: Secondary | ICD-10-CM

## 2014-01-21 DIAGNOSIS — R0602 Shortness of breath: Secondary | ICD-10-CM

## 2014-01-21 LAB — BRAIN NATRIURETIC PEPTIDE: Pro B Natriuretic peptide (BNP): 124 pg/mL — ABNORMAL HIGH (ref 0.0–100.0)

## 2014-01-21 LAB — BASIC METABOLIC PANEL
BUN: 15 mg/dL (ref 6–23)
CHLORIDE: 104 meq/L (ref 96–112)
CO2: 28 mEq/L (ref 19–32)
Calcium: 9.1 mg/dL (ref 8.4–10.5)
Creatinine, Ser: 0.9 mg/dL (ref 0.4–1.2)
GFR: 69.36 mL/min (ref >60.00)
Glucose, Bld: 110 mg/dL — ABNORMAL HIGH (ref 70–99)
POTASSIUM: 4.1 meq/L (ref 3.5–5.1)
SODIUM: 139 meq/L (ref 135–145)

## 2014-01-26 ENCOUNTER — Ambulatory Visit (INDEPENDENT_AMBULATORY_CARE_PROVIDER_SITE_OTHER): Payer: Medicaid Other | Admitting: Pharmacist Clinician (PhC)/ Clinical Pharmacy Specialist

## 2014-01-26 ENCOUNTER — Ambulatory Visit (INDEPENDENT_AMBULATORY_CARE_PROVIDER_SITE_OTHER): Payer: Medicaid Other | Admitting: *Deleted

## 2014-01-26 ENCOUNTER — Encounter: Payer: Self-pay | Admitting: Cardiology

## 2014-01-26 DIAGNOSIS — Z5181 Encounter for therapeutic drug level monitoring: Secondary | ICD-10-CM

## 2014-01-26 DIAGNOSIS — I359 Nonrheumatic aortic valve disorder, unspecified: Secondary | ICD-10-CM

## 2014-01-26 DIAGNOSIS — Z7901 Long term (current) use of anticoagulants: Secondary | ICD-10-CM

## 2014-01-26 DIAGNOSIS — I059 Rheumatic mitral valve disease, unspecified: Secondary | ICD-10-CM

## 2014-01-26 DIAGNOSIS — R0602 Shortness of breath: Secondary | ICD-10-CM

## 2014-01-26 DIAGNOSIS — Z954 Presence of other heart-valve replacement: Secondary | ICD-10-CM

## 2014-01-26 LAB — BASIC METABOLIC PANEL
BUN: 21 mg/dL (ref 6–23)
CALCIUM: 9.3 mg/dL (ref 8.4–10.5)
CO2: 29 mEq/L (ref 19–32)
Chloride: 104 mEq/L (ref 96–112)
Creatinine, Ser: 1 mg/dL (ref 0.4–1.2)
GFR: 60.55 mL/min (ref >60.00)
GLUCOSE: 114 mg/dL — AB (ref 70–99)
POTASSIUM: 3.7 meq/L (ref 3.5–5.1)
SODIUM: 140 meq/L (ref 135–145)

## 2014-01-26 LAB — BRAIN NATRIURETIC PEPTIDE: PRO B NATRI PEPTIDE: 80 pg/mL (ref 0.0–100.0)

## 2014-01-26 LAB — POCT INR: INR: 2.8

## 2014-01-27 ENCOUNTER — Other Ambulatory Visit: Payer: Self-pay | Admitting: Cardiology

## 2014-02-02 ENCOUNTER — Encounter (HOSPITAL_COMMUNITY): Payer: Self-pay

## 2014-02-02 ENCOUNTER — Ambulatory Visit (HOSPITAL_COMMUNITY)
Admission: RE | Admit: 2014-02-02 | Discharge: 2014-02-02 | Disposition: A | Discharge status: Home / Self Care | Payer: Medicaid Other | Source: Ambulatory Visit | Attending: Cardiology | Admitting: Cardiology

## 2014-02-02 VITALS — BP 110/58 | HR 80 | Wt 155.8 lb

## 2014-02-02 DIAGNOSIS — J309 Allergic rhinitis, unspecified: Secondary | ICD-10-CM | POA: Diagnosis not present

## 2014-02-02 DIAGNOSIS — I10 Essential (primary) hypertension: Secondary | ICD-10-CM | POA: Diagnosis not present

## 2014-02-02 DIAGNOSIS — Z7901 Long term (current) use of anticoagulants: Secondary | ICD-10-CM | POA: Diagnosis not present

## 2014-02-02 DIAGNOSIS — I482 Chronic atrial fibrillation, unspecified: Secondary | ICD-10-CM

## 2014-02-02 DIAGNOSIS — K644 Residual hemorrhoidal skin tags: Secondary | ICD-10-CM | POA: Diagnosis not present

## 2014-02-02 DIAGNOSIS — E785 Hyperlipidemia, unspecified: Secondary | ICD-10-CM | POA: Diagnosis not present

## 2014-02-02 DIAGNOSIS — Z952 Presence of prosthetic heart valve: Secondary | ICD-10-CM

## 2014-02-02 DIAGNOSIS — I5032 Chronic diastolic (congestive) heart failure: Secondary | ICD-10-CM | POA: Insufficient documentation

## 2014-02-02 DIAGNOSIS — I4891 Unspecified atrial fibrillation: Secondary | ICD-10-CM | POA: Diagnosis not present

## 2014-02-02 DIAGNOSIS — M109 Gout, unspecified: Secondary | ICD-10-CM | POA: Insufficient documentation

## 2014-02-02 DIAGNOSIS — D599 Acquired hemolytic anemia, unspecified: Secondary | ICD-10-CM | POA: Insufficient documentation

## 2014-02-02 DIAGNOSIS — I509 Heart failure, unspecified: Secondary | ICD-10-CM

## 2014-02-02 DIAGNOSIS — Z7982 Long term (current) use of aspirin: Secondary | ICD-10-CM | POA: Diagnosis not present

## 2014-02-02 DIAGNOSIS — Z954 Presence of other heart-valve replacement: Secondary | ICD-10-CM | POA: Insufficient documentation

## 2014-02-02 MED ORDER — TORSEMIDE 20 MG PO TABS: 80.0000 mg | ORAL_TABLET | Freq: Every day | ORAL | Status: DC

## 2014-02-02 NOTE — Patient Instructions (Signed)
Stop Furosemide  Start Torsemide 80 mg (4 tabs) daily  Labs in 10 days (02/12/14)  Please limit your Sodium intake to 1500 mg daily  Your physician has requested that you have an echocardiogram. Echocardiography is a painless test that uses sound waves to create images of your heart. It provides your doctor with information about the size and shape of your heart and how well your heart's chambers and valves are working. This procedure takes approximately one hour. There are no restrictions for this procedure.  Your physician recommends that you schedule a follow-up appointment in: 1 month

## 2014-02-03 NOTE — Progress Notes (Signed)
Patient ID: Deborah [name], female   DOB: [birth year], 63 y.o.   MRN: [id number] PCP: [facility]  63 yo with rheumatic heart disease and MV replacement/TV repair, chronic atrial fibrillation, and chronic diastolic CHF presents for cardiology followup.  At last appointment, she appeared volume overloaded and I increased her Lasix to 80 mg bid.  She continues to report increased dyspnea from her baseline.  Creatinine remained stable with increased Lasix but she does not feel like she urinated any more than before.  She is short of breath if she walks fast or carries a heavy load.  No orthopnea/PND.  She still has stabbing chest pain that is not exertional and lasts for about a second at a time.  She did not get an echo after last appointment.     Labs (8/12): K 3.5, creatinine 0.98 Labs (10/12): K 3.8, creatinine 1.0, BNP 116 Labs (1/13): K 3.6, creatinine 0.9, LDL 85, HDL 62 Labs (9/13): K 3.9, creatinine 1.0 Labs (4/14): HCT 37.8 with elevated eosinophils, BNP 1094 => 82, K 3.6, creatinine 1.0 Labs (9/14): K 3.6, creatinine 0.89, hemoglobin 11 Labs (1/15): K 3.7, creatinine 0.9, HCT 36.9 Labs (2/15): LDL 99, HDL 50 Labs (3/15): K 4, creatinine 1.28 Labs (6/15): K 3.7, creatinine 1.0, BNP 124 => 80  PMH: 1. Rheumatic heart disease: Status post mechanical MV replacement and tricuspid repair at [facility] in 2002.  Last echo 5/12 with EF 60-65%, mild AI, mechanical MV prosthesis with normal function, severe LAE, moderate RAE, moderate to severe TR, mildly elevated PA systolic pressure.  2. Chronic atrial fibrillation on coumadin.  Event monitor (7/12-8/12) showed no high rate episodes (patient continuously in atrial fibrillation).  3. Chronic diastolic CHF 4. ETT-myoview (7/12): No evidence for ischemia or infarction.  5. Impaired fasting glucose 6. Hyperlipidemia  7. HTN 8. Gout 9. Anemia 10. External hemorrhoids.  11. 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   SH: Nonsmoker. Lives in [place].   FH: No premature CAD  ROS: All systems reviewed and negative except as noted in HPI.    Current Outpatient Prescriptions  Medication Sig Dispense Refill  . acetaminophen (TYLENOL) 325 MG tablet Take 650 mg by mouth every 6 (six) hours as needed for pain.       Marland Kitchen allopurinol (ZYLOPRIM) 300 MG tablet TAKE 1 TABLET (300 MG TOTAL) BY MOUTH DAILY.  30 tablet  0  . aspirin EC 81 MG tablet Take 81 mg by mouth daily.      . cetirizine (ZYRTEC) 10 MG tablet Take 10 mg by mouth daily.      . folic acid (FOLVITE) 1 MG tablet Take 1 tablet (1 mg total) by mouth daily.  30 tablet  1  . KLOR-CON M20 20 MEQ tablet TAKE 2 TABLETS BY MOUTH EVERY MORNING AND 1 IN THE EVENING  90 tablet  1  . loteprednol (LOTEMAX) 0.2 % SUSP 1 drop 4 (four) times daily.      . metoprolol succinate (TOPROL-XL) 50 MG 24 hr tablet Take 1 tablet (50 mg total) by mouth daily. Take 1 tablet once a day  30 tablet  4  . omeprazole (PRILOSEC) 20 MG capsule Take 1 capsule (20 mg total) by mouth daily before breakfast.  30 capsule  1  . potassium chloride SA (K-DUR,KLOR-CON) 20 MEQ tablet Take 2 tablets (40 mEq total) by mouth 2 (two) times daily.  90 tablet  3  . simvastatin (ZOCOR) 10 MG  tablet Take 1 tablet (10 mg total) by mouth every other day.  15 tablet  4  . TOPROL XL 50 MG 24 hr tablet TAKE 1 TABLET BY MOUTH DAILY  30 tablet  1  . warfarin (COUMADIN) 2 MG tablet 1 tablet everyday except 1/2 tablet on Mondays and Wednesdays or as directed by coumadin clinic  35 tablet  3  . torsemide (DEMADEX) 20 MG tablet Take 4 tablets (80 mg total) by mouth daily.  120 tablet  3   No current facility-administered medications for this encounter.    BP 110/58  Pulse 80  Wt 155 lb 12.8 oz (70.67 kg)  SpO2 95% General: NAD Neck: JVP 8-9 cm, no thyromegaly or thyroid nodule.  Lungs: Clear to auscultation bilaterally with normal respiratory effort. CV: Nondisplaced PMI.   Heart irregular S1/S2, no S3/S4, mechanical S1, 2/6 HSM LLSB.  No peripheral edema.  No carotid bruit.  Normal pedal pulses.  Abdomen: Soft, nontender, no hepatosplenomegaly, no distention.  Neurologic: Alert and oriented x 3.  Psych: Normal affect. Extremities: No clubbing or cyanosis.   Assessment/Plan: 1. Atrial fibrillation: Chronic.  Continue warfarin.  Rate is controlled.    2. Mechanical mitral valve: Continue ASA/coumadin, INR goal 2.5-3.5.  Will need bridging with heparin/Lovenox if has to come off coumadin in the future for procedure.  3. Diastolic CHF:  Still some volume overload with NYHA class III symptoms, not much improvement with increased Lasix but creatinine stable.   - Stop lasix, start torsemide 80 mg daily.  Continue current KCl.   - Echo to reassess EF and valvular disease.  - BMET/BNP in 10 days.  - Followup in 4 wks.  4. Hyperlipidemia: Continue simvastatin.    Marca AnconaDalton Orvan Papadakis 02/03/2014

## 2014-02-04 ENCOUNTER — Other Ambulatory Visit (HOSPITAL_BASED_OUTPATIENT_CLINIC_OR_DEPARTMENT_OTHER): Payer: Medicaid Other

## 2014-02-04 DIAGNOSIS — D509 Iron deficiency anemia, unspecified: Secondary | ICD-10-CM

## 2014-02-04 DIAGNOSIS — D721 Eosinophilia, unspecified: Secondary | ICD-10-CM

## 2014-02-04 LAB — CBC WITH DIFFERENTIAL/PLATELET
BASO%: 1.1 % (ref 0.0–2.0)
Basophils Absolute: 0.1 10*3/uL (ref 0.0–0.1)
EOS ABS: 0.2 10*3/uL (ref 0.0–0.5)
EOS%: 2.9 % (ref 0.0–7.0)
HCT: 37.6 % (ref 34.8–46.6)
HEMOGLOBIN: 12 g/dL (ref 11.6–15.9)
LYMPH%: 37.7 % (ref 14.0–49.7)
MCH: 23.6 pg — ABNORMAL LOW (ref 25.1–34.0)
MCHC: 31.9 g/dL (ref 31.5–36.0)
MCV: 73.9 fL — AB (ref 79.5–101.0)
MONO#: 0.5 10*3/uL (ref 0.1–0.9)
MONO%: 8.8 % (ref 0.0–14.0)
NEUT#: 2.8 10*3/uL (ref 1.5–6.5)
NEUT%: 49.5 % (ref 38.4–76.8)
PLATELETS: 179 10*3/uL (ref 145–400)
RBC: 5.09 10*6/uL (ref 3.70–5.45)
RDW: 14.5 % (ref 11.2–14.5)
WBC: 5.6 10*3/uL (ref 3.9–10.3)
lymph#: 2.1 10*3/uL (ref 0.9–3.3)

## 2014-02-12 ENCOUNTER — Other Ambulatory Visit: Payer: Medicaid Other

## 2014-02-22 ENCOUNTER — Ambulatory Visit (INDEPENDENT_AMBULATORY_CARE_PROVIDER_SITE_OTHER): Payer: Medicaid Other | Admitting: *Deleted

## 2014-02-22 ENCOUNTER — Encounter (HOSPITAL_COMMUNITY): Payer: Self-pay | Admitting: Emergency Medicine

## 2014-02-22 ENCOUNTER — Emergency Department (INDEPENDENT_AMBULATORY_CARE_PROVIDER_SITE_OTHER): Payer: Medicaid Other

## 2014-02-22 ENCOUNTER — Emergency Department (HOSPITAL_COMMUNITY)
Admission: EM | Admit: 2014-02-22 | Discharge: 2014-02-22 | Disposition: A | Discharge status: Home / Self Care | Payer: Medicaid Other | Source: Home / Self Care | Attending: Family Medicine | Admitting: Family Medicine

## 2014-02-22 DIAGNOSIS — S9000XA Contusion of unspecified ankle, initial encounter: Secondary | ICD-10-CM

## 2014-02-22 DIAGNOSIS — X58XXXA Exposure to other specified factors, initial encounter: Secondary | ICD-10-CM

## 2014-02-22 DIAGNOSIS — I509 Heart failure, unspecified: Secondary | ICD-10-CM

## 2014-02-22 DIAGNOSIS — Z7901 Long term (current) use of anticoagulants: Secondary | ICD-10-CM

## 2014-02-22 DIAGNOSIS — Z954 Presence of other heart-valve replacement: Secondary | ICD-10-CM

## 2014-02-22 DIAGNOSIS — I5032 Chronic diastolic (congestive) heart failure: Secondary | ICD-10-CM

## 2014-02-22 DIAGNOSIS — I059 Rheumatic mitral valve disease, unspecified: Secondary | ICD-10-CM

## 2014-02-22 DIAGNOSIS — Z5181 Encounter for therapeutic drug level monitoring: Secondary | ICD-10-CM

## 2014-02-22 DIAGNOSIS — S9001XA Contusion of right ankle, initial encounter: Secondary | ICD-10-CM

## 2014-02-22 LAB — BASIC METABOLIC PANEL
BUN: 20 mg/dL (ref 6–23)
CO2: 28 mEq/L (ref 19–32)
Calcium: 9.1 mg/dL (ref 8.4–10.5)
Chloride: 101 mEq/L (ref 96–112)
Creatinine, Ser: 0.9 mg/dL (ref 0.4–1.2)
GFR: 65.06 mL/min (ref >60.00)
Glucose, Bld: 122 mg/dL — ABNORMAL HIGH (ref 70–99)
Potassium: 3.5 mEq/L (ref 3.5–5.1)
Sodium: 139 mEq/L (ref 135–145)

## 2014-02-22 LAB — POCT INR: INR: 3.1

## 2014-02-22 MED ORDER — DICLOFENAC SODIUM 1 % TD GEL: 2.0000 g | Freq: Four times a day (QID) | TRANSDERMAL | Status: DC

## 2014-02-22 NOTE — Discharge Instructions (Signed)
Thank you for coming in today. Keep your foot elevated.  Follow up with your doctor.    Hematoma A hematoma is a collection of blood under the skin, in an organ, in a body space, in a joint space, or in other tissue. The blood can clot to form a lump that you can see and feel. The lump is often firm and may sometimes become sore and tender. Most hematomas get better in a few days to weeks. However, some hematomas may be serious and require medical care. Hematomas can range in size from very small to very large. CAUSES  A hematoma can be caused by a blunt or penetrating injury. It can also be caused by spontaneous leakage from a blood vessel under the skin. Spontaneous leakage from a blood vessel is more likely to occur in older people, especially those taking blood thinners. Sometimes, a hematoma can develop after certain medical procedures. SIGNS AND SYMPTOMS   A firm lump on the body.  Possible pain and tenderness in the area.  Bruising.Blue, dark blue, purple-red, or yellowish skin may appear at the site of the hematoma if the hematoma is close to the surface of the skin. For hematomas in deeper tissues or body spaces, the signs and symptoms may be subtle. For example, an intra-abdominal hematoma may cause abdominal pain, weakness, fainting, and shortness of breath. An intracranial hematoma may cause a headache or symptoms such as weakness, trouble speaking, or a change in consciousness. DIAGNOSIS  A hematoma can usually be diagnosed based on your medical history and a physical exam. Imaging tests may be needed if your health care provider suspects a hematoma in deeper tissues or body spaces, such as the abdomen, head, or chest. These tests may include ultrasonography or a CT scan.  TREATMENT  Hematomas usually go away on their own over time. Rarely does the blood need to be drained out of the body. Large hematomas or those that may affect vital organs will sometimes need surgical drainage or  monitoring. HOME CARE INSTRUCTIONS   Apply ice to the injured area:   Put ice in a plastic bag.   Place a towel between your skin and the bag.   Leave the ice on for 20 minutes, 2-3 times a day for the first 1 to 2 days.   After the first 2 days, switch to using warm compresses on the hematoma.   Elevate the injured area to help decrease pain and swelling. Wrapping the area with an elastic bandage may also be helpful. Compression helps to reduce swelling and promotes shrinking of the hematoma. Make sure the bandage is not wrapped too tight.   If your hematoma is on a lower extremity and is painful, crutches may be helpful for a couple days.   Only take over-the-counter or prescription medicines as directed by your health care provider. SEEK IMMEDIATE MEDICAL CARE IF:   You have increasing pain, or your pain is not controlled with medicine.   You have a fever.   You have worsening swelling or discoloration.   Your skin over the hematoma breaks or starts bleeding.   Your hematoma is in your chest or abdomen and you have weakness, shortness of breath, or a change in consciousness.  Your hematoma is on your scalp (caused by a fall or injury) and you have a worsening headache or a change in alertness or consciousness. MAKE SURE YOU:   Understand these instructions.  Will watch your condition.  Will get help right  away if you are not doing well or get worse. Document Released: 03/27/2004 Document Revised: 04/15/2013 Document Reviewed: 01/21/2013 [facility] Patient Information 2015 [employer] [place]. This information is not intended to replace advice given to you by your health care provider. Make sure you discuss any questions you have with your health care provider.

## 2014-02-22 NOTE — ED Notes (Signed)
Pt c/o right foot pain, swelling and bruising onset 3 days Pain increases w/activity  Taking coumadin 2 mg daily Does not recall inj/trauma Alert w/no signs of acute distress.

## 2014-02-22 NOTE — ED Provider Notes (Signed)
Deborah [name] is a 63 y.o. female who presents to Urgent Care today for right medial ankle swelling and pain. Symptoms present for 2-3 days without injury. Patient notes that she is currently taking warfarin for a prosthetic heart valve. Her INR was 3 this morning. She denies any fevers or chills nausea vomiting or diarrhea. She feels well otherwise. Pain is moderate and worse with activity.  Patient additionally notes that she would like a refill of her Voltaren gel. She uses on her MCPs. She feels well but would like a refill of possible.   Past Medical History  Diagnosis Date  . Rheumatic heart disease     Status post mechanical (St. Jude) mitral valve replacement and tricuspid valve repair at [facility] in 2002; echo 5/12:   EF 60-65%, mild AI, mitral valve prosthesis with AVA 1.66, severe LAE, moderate RAE, moderate to severe TR, mild increased pulmonary artery systolic pressure;    Adenosine Cardiolite in 4/09:   No ischemia, EF 66%  . Chronic atrial fibrillation     [facility];  Event montor (7/12-8/12) showed no high rate episodes (patient continuously in atrial fibrillation).   . Chronic diastolic heart failure   . Fatigue   . Borderline diabetes   . Hyperlipidemia   . Obesity   . Anemia   . Gout   . Hypertension   . Carpal tunnel syndrome   . External hemorrhoids   . Rotator cuff syndrome   . 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 Gaylyn RongHa  . 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 Hx of cardiovascular stress test     ETT-myoview (7/12): No evidence for ischemia or infarction  . External hemorrhoid   . Allergic rhinitis   . Eosinophilia 01/27/2013   History  Substance Use Topics  . Smoking status: Never Smoker   . Smokeless tobacco: Never Used  . Alcohol Use: No   ROS as above Medications: No current facility-administered medications for this encounter.   Current Outpatient Prescriptions  Medication Sig Dispense Refill  . allopurinol (ZYLOPRIM)  300 MG tablet TAKE 1 TABLET (300 MG TOTAL) BY MOUTH DAILY.  30 tablet  0  . aspirin EC 81 MG tablet Take 81 mg by mouth daily.      . folic acid (FOLVITE) 1 MG tablet Take 1 tablet (1 mg total) by mouth daily.  30 tablet  1  . simvastatin (ZOCOR) 10 MG tablet Take 1 tablet (10 mg total) by mouth every other day.  15 tablet  4  . torsemide (DEMADEX) 20 MG tablet Take 4 tablets (80 mg total) by mouth daily.  120 tablet  3  . warfarin (COUMADIN) 2 MG tablet 1 tablet everyday except 1/2 tablet on Mondays and Wednesdays or as directed by coumadin clinic  35 tablet  3  . acetaminophen (TYLENOL) 325 MG tablet Take 650 mg by mouth every 6 (six) hours as needed for pain.       . cetirizine (ZYRTEC) 10 MG tablet Take 10 mg by mouth daily.      . diclofenac sodium (VOLTAREN) 1 % GEL Apply 2 g topically 4 (four) times daily.  100 g  3  . KLOR-CON M20 20 MEQ tablet TAKE 2 TABLETS BY MOUTH EVERY MORNING AND 1 IN THE EVENING  90 tablet  1  . loteprednol (LOTEMAX) 0.2 % SUSP 1 drop 4 (four) times daily.      . metoprolol succinate (TOPROL-XL) 50 MG 24 hr tablet  Take 1 tablet (50 mg total) by mouth daily. Take 1 tablet once a day  30 tablet  4  . omeprazole (PRILOSEC) 20 MG capsule Take 1 capsule (20 mg total) by mouth daily before breakfast.  30 capsule  1  . potassium chloride SA (K-DUR,KLOR-CON) 20 MEQ tablet Take 2 tablets (40 mEq total) by mouth 2 (two) times daily.  90 tablet  3  . TOPROL XL 50 MG 24 hr tablet TAKE 1 TABLET BY MOUTH DAILY  30 tablet  1    Exam:  BP 129/82  Pulse 86  Temp(Src) 98.5 F (36.9 C) (Oral)  Resp 16  SpO2 100% Gen: Well NAD   Right ankle: Ecchymosis swelling and tenderness at the medial malleoli extending to the mid medial foot.  Pulses capillary refill and sensation are intact. Ankle motion is intact. Strength is intact  Limited musculoskeletal ultrasound of the right ankle:  Medial ankle structures are normal appearing. No bony abnormalities. Posterior tibialis tendon  vascular structures and flexor digitorum and hallux  tendons are intact.  She has significant stranding hypoechoic fluid consistent with hematoma. No ankle effusion.  Results for orders placed in visit on 02/22/14 (from the past 24 hour(s))  POCT INR     Status: None   Collection Time    02/22/14  9:56 AM      Result Value Ref Range   INR 3.1     Dg Ankle Complete Right  02/22/2014   CLINICAL DATA:  Right ankle pain, swelling, and bruising.  EXAM: RIGHT ANKLE - COMPLETE 3+ VIEW  COMPARISON:  None.  FINDINGS: There is no evidence of fracture, dislocation, or joint effusion. There is no evidence of arthropathy or other focal bone abnormality. Soft tissue swelling is seen along the medial aspect of the ankle joint.  IMPRESSION: Medial soft tissue swelling.  No evidence fracture.   Electronically Signed   By: Myles RosenthalJohn  Stahl M.D.   On: 02/22/2014 15:48   Dg Foot Complete Right  02/22/2014   CLINICAL DATA:  Medial right foot pain and swelling. Patient denies recent injury.  EXAM: RIGHT FOOT COMPLETE - 3+ VIEW  COMPARISON:  None.  FINDINGS: No evidence of acute or subacute fracture or dislocation. Mild hypertrophic spurring involving the 1st MTP joint without associated erosions. Mild joint space narrowing involving the IP joints of multiple toes. Remaining joint spaces well preserved. Bone mineral density well-preserved.  IMPRESSION: No acute or subacute osseous abnormality. Mild degenerative changes involving the 1st MTP joint in the IP joints of multiple toes.   Electronically Signed   By: Hulan Saashomas  Lawrence M.D.   On: 02/22/2014 15:50    Assessment and Plan: 63 y.o. female with ecchymosis of the medial right ankle. This is likely secondary to hematoma resulting from her elevated INR. She is currently within range. I suspect she had a small injury 3 days ago that she does not recall. Plan for compression elevation and rest. Followup with primary care provider.  Refill Voltaren  Discussed warning signs  or symptoms. Please see discharge instructions. Patient expresses understanding.    Rodolph BongEvan S Corey, MD 02/22/14 [phone]

## 2014-02-23 ENCOUNTER — Encounter (HOSPITAL_COMMUNITY): Payer: Self-pay

## 2014-02-23 ENCOUNTER — Ambulatory Visit (HOSPITAL_BASED_OUTPATIENT_CLINIC_OR_DEPARTMENT_OTHER)
Admission: RE | Admit: 2014-02-23 | Discharge: 2014-02-23 | Disposition: A | Discharge status: Home / Self Care | Payer: Medicaid Other | Source: Ambulatory Visit | Attending: Internal Medicine | Admitting: Internal Medicine

## 2014-02-23 ENCOUNTER — Ambulatory Visit (HOSPITAL_COMMUNITY)
Admission: RE | Admit: 2014-02-23 | Discharge: 2014-02-23 | Disposition: A | Discharge status: Home / Self Care | Payer: Medicaid Other | Source: Ambulatory Visit | Attending: Internal Medicine | Admitting: Internal Medicine

## 2014-02-23 VITALS — BP 94/70 | HR 78 | Wt 154.1 lb

## 2014-02-23 DIAGNOSIS — I2510 Atherosclerotic heart disease of native coronary artery without angina pectoris: Secondary | ICD-10-CM | POA: Insufficient documentation

## 2014-02-23 DIAGNOSIS — E785 Hyperlipidemia, unspecified: Secondary | ICD-10-CM | POA: Diagnosis not present

## 2014-02-23 DIAGNOSIS — R079 Chest pain, unspecified: Secondary | ICD-10-CM

## 2014-02-23 DIAGNOSIS — I359 Nonrheumatic aortic valve disorder, unspecified: Secondary | ICD-10-CM | POA: Diagnosis not present

## 2014-02-23 DIAGNOSIS — I059 Rheumatic mitral valve disease, unspecified: Secondary | ICD-10-CM | POA: Diagnosis not present

## 2014-02-23 DIAGNOSIS — I4891 Unspecified atrial fibrillation: Secondary | ICD-10-CM

## 2014-02-23 DIAGNOSIS — I079 Rheumatic tricuspid valve disease, unspecified: Secondary | ICD-10-CM | POA: Diagnosis not present

## 2014-02-23 DIAGNOSIS — I509 Heart failure, unspecified: Secondary | ICD-10-CM | POA: Diagnosis present

## 2014-02-23 DIAGNOSIS — I5032 Chronic diastolic (congestive) heart failure: Secondary | ICD-10-CM

## 2014-02-23 DIAGNOSIS — I10 Essential (primary) hypertension: Secondary | ICD-10-CM | POA: Diagnosis not present

## 2014-02-23 DIAGNOSIS — I482 Chronic atrial fibrillation, unspecified: Secondary | ICD-10-CM

## 2014-02-23 DIAGNOSIS — I369 Nonrheumatic tricuspid valve disorder, unspecified: Secondary | ICD-10-CM

## 2014-02-23 NOTE — Progress Notes (Signed)
*   Echocardiogram 2D Echocardiogram has been performed.  Arvil ChacoFoster, Rachel 02/23/2014, 3:40 PM

## 2014-02-23 NOTE — Progress Notes (Signed)
Patient ID: Deborah [name], female   DOB: 02/08/1951, 63 y.o.   MRN: [id number] [facility]  63 yo with rheumatic heart disease and MV replacement/TV repair, chronic atrial fibrillation, and chronic diastolic CHF.   She returns for follow up. Last visit lasix was stopped and she started on torsemide.Complains of chest pain at rest or walking that lasts 1 second. On a daily basis.  Breathing better. Sleeps on 2 pillows.  Weight at home 154-155 pounds. R foot ecchymotic with no known trauma. R foot pain.   Echo today reviewed: EF 55-60%, normal RV, normal mechanical mitral valve, severe LAE, moderate TR.   Labs (8/12): K 3.5, creatinine 0.98 Labs (10/12): K 3.8, creatinine 1.0, BNP 116 Labs (1/13): K 3.6, creatinine 0.9, LDL 85, HDL 62 Labs (9/13): K 3.9, creatinine 1.0 Labs (4/14): HCT 37.8 with elevated eosinophils, BNP 1094 => 82, K 3.6, creatinine 1.0 Labs (9/14): K 3.6, creatinine 0.89, hemoglobin 11 Labs (1/15): K 3.7, creatinine 0.9, HCT 36.9 Labs (2/15): LDL 99, HDL 50 Labs (3/15): K 4, creatinine 1.28 Labs (6/15): K 3.7, creatinine 1.0, BNP 124 => 80 Labs (02/22/14): K 3.5 Creatinine 0.9  PMH: 1. Rheumatic heart disease: Status post mechanical MV replacement and [facility].  Echo 5/12 with EF 60-65%, mild AI, mechanical MV prosthesis with normal function, severe LAE, moderate RAE, moderate to severe TR, mildly elevated PA systolic pressure.  Echo (6/15) EF 55-60%, normal RV, normal mechanical mitral valve, severe LAE, moderate TR.  2. Chronic atrial fibrillation on coumadin.  Event monitor (7/12-8/12) showed no high rate episodes (patient continuously in atrial fibrillation).  3. Chronic diastolic CHF 4. ETT-myoview (7/12): No evidence for ischemia or infarction.  5. Impaired fasting glucose 6. Hyperlipidemia  7. HTN 8. Gout 9. Anemia 10. External hemorrhoids.  11. 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   SH: Nonsmoker. [place] in [place]   FH: No premature CAD  ROS: All systems reviewed and negative except as noted in HPI.    Current Outpatient Prescriptions  Medication Sig Dispense Refill  . acetaminophen (TYLENOL) 325 MG tablet Take 650 mg by mouth every 6 (six) hours as needed for pain.       Marland Kitchen. allopurinol (ZYLOPRIM) 300 MG tablet TAKE 1 TABLET (300 MG TOTAL) BY MOUTH DAILY.  30 tablet  0  . aspirin EC 81 MG tablet Take 81 mg by mouth daily.      . cetirizine (ZYRTEC) 10 MG tablet Take 10 mg by mouth daily.      . diclofenac sodium (VOLTAREN) 1 % GEL Apply 2 g topically 4 (four) times daily.  100 g  3  . folic acid (FOLVITE) 1 MG tablet Take 1 tablet (1 mg total) by mouth daily.  30 tablet  1  . KLOR-CON M20 20 MEQ tablet TAKE 2 TABLETS BY MOUTH EVERY MORNING AND 1 IN THE EVENING  90 tablet  1  . loteprednol (LOTEMAX) 0.2 % SUSP 1 drop 4 (four) times daily.      . metoprolol succinate (TOPROL-XL) 50 MG 24 hr tablet Take 1 tablet (50 mg total) by mouth daily. Take 1 tablet once a day  30 tablet  4  . omeprazole (PRILOSEC) 20 MG capsule Take 1 capsule (20 mg total) by mouth daily before breakfast.  30 capsule  1  . potassium chloride SA (K-DUR,KLOR-CON) 20 MEQ tablet Take 2 tablets (40 mEq total) by mouth 2 (two)  times daily.  90 tablet  3  . simvastatin (ZOCOR) 10 MG tablet Take 1 tablet (10 mg total) by mouth every other day.  15 tablet  4  . TOPROL XL 50 MG 24 hr tablet TAKE 1 TABLET BY MOUTH DAILY  30 tablet  1  . torsemide (DEMADEX) 20 MG tablet Take 4 tablets (80 mg total) by mouth daily.  120 tablet  3  . warfarin (COUMADIN) 2 MG tablet 1 tablet everyday except 1/2 tablet on Mondays and Wednesdays or as directed by coumadin clinic  35 tablet  3   No current facility-administered medications for this encounter.    BP 94/70  Pulse 78  Wt 154 lb 1.9 oz (69.908 kg)  SpO2 99% General: NAD Neck: JVP  7-8 cm, no thyromegaly or thyroid nodule.  Lungs: Clear  to auscultation bilaterally with normal respiratory effort. CV: Nondisplaced PMI.  Heart irregular S1/S2, no S3/S4, mechanical S1, 2/6 HSM LLSB.  No peripheral edema.  No carotid bruit.  Normal pedal pulses.  Abdomen: Soft, nontender, no hepatosplenomegaly, no distention.  Neurologic: Alert and oriented x 3.  Psych: Normal affect. Extremities: No clubbing or cyanosis. R foot medial aspect ecchymotic   Assessment/Plan: 1. Atrial fibrillation: Chronic.  Continue warfarin.  Rate is controlled.    2. Mechanical mitral valve: Continue ASA/coumadin, INR goal 2.5-3.5.  Will need bridging with heparin/Lovenox if has to come off coumadin in the future for procedure.  3. Diastolic CHF:  Looks near-euvolemic.  - On  torsemide 80 mg daily.  Continue current KCl.   4. Hyperlipidemia: Continue simvastatin.  5. R foot pain -  Ecchymotic. No traumatic injury. [facility]. No acute findings.  6. Chest Pain: Doubt ischemic however will schedule stress test.    Follow up in 3 months unless stress test abnormal.  CLEGG,AMY 02/23/2014  Patient seen with NP, agree with the above note.  Echo today stable compared to prior.  She is breathing better on torsemide and feels back to baseline, will continue.  She continues to have atypical chest pain.  We will schedule a Lexiscan Cardiolite.   Marca AnconaDalton McLean 02/24/2014

## 2014-02-24 DIAGNOSIS — R079 Chest pain, unspecified: Secondary | ICD-10-CM | POA: Insufficient documentation

## 2014-02-26 ENCOUNTER — Other Ambulatory Visit: Payer: Self-pay | Admitting: Cardiology

## 2014-02-28 ENCOUNTER — Other Ambulatory Visit: Payer: Self-pay | Admitting: Cardiology

## 2014-03-01 ENCOUNTER — Encounter: Payer: Self-pay | Admitting: Cardiology

## 2014-03-03 ENCOUNTER — Encounter (HOSPITAL_COMMUNITY): Payer: Medicaid Other

## 2014-03-23 ENCOUNTER — Ambulatory Visit (INDEPENDENT_AMBULATORY_CARE_PROVIDER_SITE_OTHER): Payer: Medicaid Other

## 2014-03-23 DIAGNOSIS — Z5181 Encounter for therapeutic drug level monitoring: Secondary | ICD-10-CM

## 2014-03-23 DIAGNOSIS — I059 Rheumatic mitral valve disease, unspecified: Secondary | ICD-10-CM

## 2014-03-23 DIAGNOSIS — Z7901 Long term (current) use of anticoagulants: Secondary | ICD-10-CM

## 2014-03-23 DIAGNOSIS — Z954 Presence of other heart-valve replacement: Secondary | ICD-10-CM

## 2014-03-23 LAB — POCT INR: INR: 3.6

## 2014-03-25 ENCOUNTER — Other Ambulatory Visit: Payer: Self-pay | Admitting: Cardiology

## 2014-03-26 ENCOUNTER — Other Ambulatory Visit: Payer: Self-pay | Admitting: *Deleted

## 2014-03-26 MED ORDER — WARFARIN SODIUM 2 MG PO TABS: 2.0000 mg | ORAL_TABLET | ORAL | Status: DC

## 2014-04-06 ENCOUNTER — Encounter (HOSPITAL_COMMUNITY): Payer: Medicaid Other

## 2014-04-20 ENCOUNTER — Ambulatory Visit (INDEPENDENT_AMBULATORY_CARE_PROVIDER_SITE_OTHER): Payer: Medicaid Other | Admitting: *Deleted

## 2014-04-20 DIAGNOSIS — Z5181 Encounter for therapeutic drug level monitoring: Secondary | ICD-10-CM

## 2014-04-20 DIAGNOSIS — Z954 Presence of other heart-valve replacement: Secondary | ICD-10-CM

## 2014-04-20 DIAGNOSIS — Z7901 Long term (current) use of anticoagulants: Secondary | ICD-10-CM

## 2014-04-20 DIAGNOSIS — I059 Rheumatic mitral valve disease, unspecified: Secondary | ICD-10-CM

## 2014-04-20 LAB — POCT INR: INR: 3.1

## 2014-04-27 ENCOUNTER — Other Ambulatory Visit (HOSPITAL_COMMUNITY): Payer: Self-pay | Admitting: Internal Medicine

## 2014-04-27 ENCOUNTER — Other Ambulatory Visit: Payer: Self-pay | Admitting: Cardiology

## 2014-04-29 ENCOUNTER — Other Ambulatory Visit (HOSPITAL_BASED_OUTPATIENT_CLINIC_OR_DEPARTMENT_OTHER): Payer: Medicaid Other

## 2014-04-29 DIAGNOSIS — D560 Alpha thalassemia: Secondary | ICD-10-CM

## 2014-04-29 DIAGNOSIS — D509 Iron deficiency anemia, unspecified: Secondary | ICD-10-CM

## 2014-04-29 DIAGNOSIS — D582 Other hemoglobinopathies: Secondary | ICD-10-CM

## 2014-04-29 DIAGNOSIS — D721 Eosinophilia, unspecified: Secondary | ICD-10-CM

## 2014-04-29 LAB — CBC WITH DIFFERENTIAL/PLATELET
BASO%: 1.8 % (ref 0.0–2.0)
BASOS ABS: 0.1 10*3/uL (ref 0.0–0.1)
EOS ABS: 0.2 10*3/uL (ref 0.0–0.5)
EOS%: 3.1 % (ref 0.0–7.0)
HCT: 38.9 % (ref 34.8–46.6)
HEMOGLOBIN: 12 g/dL (ref 11.6–15.9)
LYMPH#: 2.3 10*3/uL (ref 0.9–3.3)
LYMPH%: 37.2 % (ref 14.0–49.7)
MCH: 23.2 pg — ABNORMAL LOW (ref 25.1–34.0)
MCHC: 30.8 g/dL — ABNORMAL LOW (ref 31.5–36.0)
MCV: 75.6 fL — ABNORMAL LOW (ref 79.5–101.0)
MONO#: 0.7 10*3/uL (ref 0.1–0.9)
MONO%: 11.6 % (ref 0.0–14.0)
NEUT#: 2.8 10*3/uL (ref 1.5–6.5)
NEUT%: 46.3 % (ref 38.4–76.8)
PLATELETS: 183 10*3/uL (ref 145–400)
RBC: 5.15 10*6/uL (ref 3.70–5.45)
RDW: 15 % — ABNORMAL HIGH (ref 11.2–14.5)
WBC: 6 10*3/uL (ref 3.9–10.3)

## 2014-05-18 ENCOUNTER — Ambulatory Visit (INDEPENDENT_AMBULATORY_CARE_PROVIDER_SITE_OTHER): Payer: Medicaid Other

## 2014-05-18 DIAGNOSIS — I059 Rheumatic mitral valve disease, unspecified: Secondary | ICD-10-CM

## 2014-05-18 DIAGNOSIS — Z5181 Encounter for therapeutic drug level monitoring: Secondary | ICD-10-CM

## 2014-05-18 DIAGNOSIS — Z954 Presence of other heart-valve replacement: Secondary | ICD-10-CM

## 2014-05-18 DIAGNOSIS — Z7901 Long term (current) use of anticoagulants: Secondary | ICD-10-CM

## 2014-05-18 LAB — POCT INR: INR: 5.2

## 2014-05-25 ENCOUNTER — Other Ambulatory Visit: Payer: Self-pay | Admitting: Cardiology

## 2014-05-26 ENCOUNTER — Ambulatory Visit: Payer: Medicaid Other | Admitting: Family Medicine

## 2014-05-27 ENCOUNTER — Ambulatory Visit (INDEPENDENT_AMBULATORY_CARE_PROVIDER_SITE_OTHER): Payer: Medicaid Other | Admitting: *Deleted

## 2014-05-27 DIAGNOSIS — Z5181 Encounter for therapeutic drug level monitoring: Secondary | ICD-10-CM

## 2014-05-27 DIAGNOSIS — Z7901 Long term (current) use of anticoagulants: Secondary | ICD-10-CM

## 2014-05-27 DIAGNOSIS — Z952 Presence of prosthetic heart valve: Secondary | ICD-10-CM

## 2014-05-27 DIAGNOSIS — I059 Rheumatic mitral valve disease, unspecified: Secondary | ICD-10-CM

## 2014-05-27 DIAGNOSIS — Z954 Presence of other heart-valve replacement: Secondary | ICD-10-CM

## 2014-05-27 LAB — POCT INR: INR: 3

## 2014-05-29 ENCOUNTER — Other Ambulatory Visit: Payer: Self-pay | Admitting: Cardiology

## 2014-05-31 ENCOUNTER — Other Ambulatory Visit: Payer: Self-pay

## 2014-05-31 DIAGNOSIS — J189 Pneumonia, unspecified organism: Secondary | ICD-10-CM

## 2014-05-31 DIAGNOSIS — I5032 Chronic diastolic (congestive) heart failure: Secondary | ICD-10-CM

## 2014-05-31 DIAGNOSIS — R0602 Shortness of breath: Secondary | ICD-10-CM

## 2014-05-31 DIAGNOSIS — I482 Chronic atrial fibrillation, unspecified: Secondary | ICD-10-CM

## 2014-06-01 ENCOUNTER — Other Ambulatory Visit: Payer: Self-pay | Admitting: *Deleted

## 2014-06-01 MED ORDER — POTASSIUM CHLORIDE CRYS ER 20 MEQ PO TBCR: EXTENDED_RELEASE_TABLET | ORAL | Status: DC

## 2014-06-01 NOTE — Telephone Encounter (Signed)
Patient requests potassium refill. Not sure of her correct dose. Please advise. Thanks, MI

## 2014-06-02 ENCOUNTER — Ambulatory Visit (INDEPENDENT_AMBULATORY_CARE_PROVIDER_SITE_OTHER): Payer: Medicaid Other | Admitting: Family Medicine

## 2014-06-02 ENCOUNTER — Encounter: Payer: Self-pay | Admitting: Family Medicine

## 2014-06-02 VITALS — BP 130/84 | HR 114 | Temp 98.0°F | Ht 59.0 in | Wt 157.6 lb

## 2014-06-02 DIAGNOSIS — Z23 Encounter for immunization: Secondary | ICD-10-CM | POA: Diagnosis not present

## 2014-06-02 DIAGNOSIS — E785 Hyperlipidemia, unspecified: Secondary | ICD-10-CM

## 2014-06-02 DIAGNOSIS — Z0000 Encounter for general adult medical examination without abnormal findings: Secondary | ICD-10-CM

## 2014-06-02 DIAGNOSIS — E1169 Type 2 diabetes mellitus with other specified complication: Secondary | ICD-10-CM

## 2014-06-02 DIAGNOSIS — I10 Essential (primary) hypertension: Secondary | ICD-10-CM

## 2014-06-02 DIAGNOSIS — E118 Type 2 diabetes mellitus with unspecified complications: Secondary | ICD-10-CM

## 2014-06-02 DIAGNOSIS — M25511 Pain in right shoulder: Secondary | ICD-10-CM

## 2014-06-02 LAB — POCT GLYCOSYLATED HEMOGLOBIN (HGB A1C): HEMOGLOBIN A1C: 6.2

## 2014-06-02 MED ORDER — ALLOPURINOL 300 MG PO TABS: 300.0000 mg | ORAL_TABLET | Freq: Every day | ORAL | Status: DC

## 2014-06-02 MED ORDER — TRAMADOL HCL 50 MG PO TABS: 50.0000 mg | ORAL_TABLET | Freq: Three times a day (TID) | ORAL | Status: DC | PRN

## 2014-06-02 MED ORDER — GLUCOSE BLOOD VI STRP: ORAL_STRIP | Status: DC

## 2014-06-02 MED ORDER — DICLOFENAC SODIUM 1 % TD GEL: 2.0000 g | Freq: Four times a day (QID) | TRANSDERMAL | Status: DC

## 2014-06-02 MED ORDER — ZOSTER VACCINE LIVE 19400 UNT/0.65ML ~~LOC~~ SOLR: 0.6500 mL | Freq: Once | SUBCUTANEOUS | Status: DC

## 2014-06-02 MED ORDER — FOLIC ACID 1 MG PO TABS: 1.0000 mg | ORAL_TABLET | Freq: Every day | ORAL | Status: DC

## 2014-06-02 MED ORDER — OMEPRAZOLE 20 MG PO CPDR: 20.0000 mg | DELAYED_RELEASE_CAPSULE | Freq: Every day | ORAL | Status: DC

## 2014-06-02 NOTE — Progress Notes (Signed)
Subjective:    Patient ID: Deborah [name], female    DOB: 13-Jul-1951, 63 y.o.   MRN: [id number]  HPI: Pt presents to clinic to establish care and for annual physical exam. She has an extensive PMH reviewed and summarized below. -Pt does complain of intermittent neck pain for 7-8 years, with radiation into her hands. -She has hx of rotator cuff tear or injury on the right; she has seen Dr August Saucer of [facility] ortho in the past, but it was remote. -The pain bothers her especially at night. She has used topical cream (?Voltaren) and tramadol, but she needs refills, as they significantly help.  Pt checks her blood sugar daily but needs refills of her strips. Denies frank hypo- or hyperglycemic symptoms. She requests refills of allopurinol for gout, omeprazole, and folic acid. Her other meds are refilled by her other doctors.  She sees Dr. [name] for cardiology, Dr. Rosie Fate for oncology (related to anemia / Hemoglobin E and Hemoglobin H Constant Spring). Pt sees Dr. Madilyn Fireman for GI. She needs colonoscopy.  She is due for a mammogram and pap smear, as well. She would like immunization against pneumonia, flu, Tdap, and Zoster.  Pt is a never smoker.  Family History  Problem Relation Age of Onset  . Stroke Mother   . Diabetes Mother   . Heart failure Mother     pacemaker  . Heart disease Father     "heart problems"  . Heart disease Sister   . Heart disease Brother   . Gout Brother   . Diabetes Brother   . Stroke Brother   . Valvular heart disease Sister     Past Medical History  Diagnosis Date  . Rheumatic heart disease     Status post mechanical (St. Jude) mitral valve replacement and tricuspid valve repair at [facility] in 2002; echo 5/12:   EF 60-65%, mild AI, mitral valve prosthesis with AVA 1.66, severe LAE, moderate RAE, moderate to severe TR, mild increased pulmonary artery systolic pressure;    Adenosine Cardiolite in 4/09:   No ischemia, EF 66%  . Chronic atrial fibrillation    coumadin managed by [facility] CVRR;  Event montor (7/12-8/12) showed no high rate episodes (patient continuously in atrial fibrillation).   . Chronic diastolic heart failure   . Fatigue   . Borderline diabetes   . Hyperlipidemia   . Obesity   . Anemia   . Gout   . Hypertension   . Carpal tunnel syndrome   . External hemorrhoids   . Rotator cuff syndrome   . 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 Gaylyn Rong  . 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 Hx of cardiovascular stress test     ETT-myoview (7/12): No evidence for ischemia or infarction  . External hemorrhoid   . Allergic rhinitis   . Eosinophilia 01/27/2013    Past Surgical History  Procedure Laterality Date  . Tricuspid valve repair    . Cardiac catheterization      EF 55%    History   Social History  . Marital Status: Married    Spouse Name: N/A    Number of Children: N/A  . Years of Education: N/A   Occupational History  . Not on file.   Social History Main Topics  . Smoking status: Never Smoker   . Smokeless tobacco: Never Used  . Alcohol Use: No  . Drug Use: No  . Sexual Activity: Not on file   Other Topics  Concern  . Not on file   Social History Narrative  . No narrative on file    In addition to the above documentation, pt's PMH, surgical history, FH, and SH all reviewed and updated where appropriate in the EMR. I have also reviewed and updated the pt's allergies and current medications as appropriate.  Review of Systems: As above. She has occasional dizziness but relates this to her afib and medications. Otherwise, full 12-system ROS was reviewed and all negative.     Objective:   Physical Exam BP 130/84  Pulse 114  Temp(Src) 98 F (36.7 C) (Oral)  Ht 4\' 11"  (1.499 m)  Wt 157 lb 9.6 oz (71.487 kg)  BMI 31.81 kg/m2 Gen: well-appearing adult female in NAD HEENT: [place]/AT, sclerae/conjunctivae clear, no lid lag, EOMI, PERRLA   MMM, posterior oropharynx clear, no cervical  lymphadenopathy Neck: full ROM, no masses appreciated; thyroid not enlarged  Cardio: RRR, no murmur appreciated; distal pulses intact/symmetric Pulm: CTAB, no wheezes, normal WOB  Abd: soft, nondistended, BS+, no HSM Ext: warm/well-perfused, no cyanosis/clubbing/edema MSK: strength 5/5 in all four extremities, no frank joint deformity/effusion  normal ROM to all four extremities with no point muscle/bony tenderness in lumbar spine  Increased pain with turning head to the right, with muscle tightness / tenderness in right cervical spine  Pain with empty can test on right, but full ROM through rotator cuff; increased pain limits further testing Neuro/Psych: alert/oriented, sensation grossly intact; normal gait/balance  mood reported euthymic with congruent affect     Assessment & Plan:  63yo female with multiple chronic medical issues but generally doing well. - refilled medications for gout, GERD, and neck pain - re-referred to previous orthopedist for neck / shoulder pain, possibly related to old injury / rotator cuff pathology - refilled DM test strips  Anticipatory guidance / Risk factor reduction - praised on abstinence from EtOH and tobacco and discussed maintenance of healthy diet, exercise - encouraged regular follow up with me for primary care and with specialists especially given complexity of history  Immunization / screening / ancillary studies  - Tdap, flu, pneumonia shots given today - Rx given for Zoster vaccine - will check A1c and CMP today - due for colonoscopy, instructed pt to f/u with GI doctor (Dr. Madilyn FiremanHayes) - due for mammography, provided instructions on scheduling this - due for pap smear, likely will perform at next f/u  Follow-up in 3 months for review of specific chronic issues (primarily "borderline" DM)  See problem list notes.  MDM Justification for New Patient Level IV visit ([id number]):  Problem points: 4 (established problems addressed as above - DM,  shoulder/neck pain)  Risk: Moderate (Rx management)  Bobbye Mortonhristopher M Street, MD PGY-3, [facility] 06/02/2014, 6:11 PM

## 2014-06-02 NOTE — Patient Instructions (Addendum)
Thank you for coming in, today!  It was nice to meet you, today! I will give you a refill on tramadol, folic acid, omeprazole, and Voltaren gel for your shoulder. I will also give you a refill on your blood sugar test strips.  Follow up with your other doctors as instructed. I will put in a referral for Dr. August Saucerean for your shoulder, today. It might take several days or longer to get an appointment with Dr. August Saucerean.  I will check some basic labs today and either call you or send you a letter with the results.  Come back to see me in about 3-6 months. You can make an appointment at any time to get a pap smear. Make sure you get a mammogram, soon. Also talk to Dr. Madilyn FiremanHayes' office about getting a colonoscopy.  Please feel free to call with any questions or concerns at any time, at [phone]. --Dr. Casper HarrisonStreet

## 2014-06-03 ENCOUNTER — Telehealth: Payer: Self-pay | Admitting: *Deleted

## 2014-06-03 ENCOUNTER — Encounter: Payer: Self-pay | Admitting: Family Medicine

## 2014-06-03 ENCOUNTER — Other Ambulatory Visit: Payer: Self-pay | Admitting: Family Medicine

## 2014-06-03 DIAGNOSIS — Z1231 Encounter for screening mammogram for malignant neoplasm of breast: Secondary | ICD-10-CM

## 2014-06-03 LAB — COMPREHENSIVE METABOLIC PANEL
ALBUMIN: 4.7 g/dL (ref 3.5–5.2)
ALT: 22 U/L (ref 0–35)
AST: 23 U/L (ref 0–37)
Alkaline Phosphatase: 78 U/L (ref 39–117)
BUN: 17 mg/dL (ref 6–23)
CO2: 30 mEq/L (ref 19–32)
Calcium: 8.6 mg/dL (ref 8.4–10.5)
Chloride: 103 mEq/L (ref 96–112)
Creat: 0.94 mg/dL (ref 0.50–1.10)
Glucose, Bld: 101 mg/dL — ABNORMAL HIGH (ref 70–99)
POTASSIUM: 3.3 meq/L — AB (ref 3.5–5.3)
Sodium: 142 mEq/L (ref 135–145)
TOTAL PROTEIN: 7.2 g/dL (ref 6.0–8.3)
Total Bilirubin: 1.5 mg/dL — ABNORMAL HIGH (ref 0.2–1.2)

## 2014-06-03 MED ORDER — ACCU-CHEK AVIVA DEVI: Status: AC

## 2014-06-03 NOTE — Telephone Encounter (Signed)
To be faxed this afternoon. Thanks. --CMS

## 2014-06-03 NOTE — Addendum Note (Signed)
Addended by: Bobbye MortonSTREET, Vanesa Renier M on: 06/03/2014 04:39 PM   Modules accepted: Orders

## 2014-06-03 NOTE — Telephone Encounter (Signed)
Received a call from [facility] needing a Rx for Acc-Chek Aviva meter.  Please fax Rx to [phone].  Clovis PuMartin, Aerilynn Goin L, RN

## 2014-06-07 MED ORDER — POTASSIUM CHLORIDE CRYS ER 20 MEQ PO TBCR: EXTENDED_RELEASE_TABLET | ORAL | Status: DC

## 2014-06-07 NOTE — Addendum Note (Signed)
Addended by: JEFFRIES, Milagros ReapHANTEL M on: 06/07/2014 04:02 PM   Modules accepted: Orders

## 2014-06-08 ENCOUNTER — Ambulatory Visit (HOSPITAL_COMMUNITY)
Admission: RE | Admit: 2014-06-08 | Discharge: 2014-06-08 | Disposition: A | Discharge status: Home / Self Care | Payer: Medicaid Other | Source: Ambulatory Visit | Attending: Internal Medicine | Admitting: Internal Medicine

## 2014-06-08 DIAGNOSIS — Z1231 Encounter for screening mammogram for malignant neoplasm of breast: Secondary | ICD-10-CM | POA: Diagnosis not present

## 2014-06-10 ENCOUNTER — Ambulatory Visit (INDEPENDENT_AMBULATORY_CARE_PROVIDER_SITE_OTHER): Payer: Medicaid Other | Admitting: Pharmacist Clinician (PhC)/ Clinical Pharmacy Specialist

## 2014-06-10 DIAGNOSIS — Z954 Presence of other heart-valve replacement: Secondary | ICD-10-CM

## 2014-06-10 DIAGNOSIS — I059 Rheumatic mitral valve disease, unspecified: Secondary | ICD-10-CM

## 2014-06-10 DIAGNOSIS — Z5181 Encounter for therapeutic drug level monitoring: Secondary | ICD-10-CM

## 2014-06-10 DIAGNOSIS — Z952 Presence of prosthetic heart valve: Secondary | ICD-10-CM

## 2014-06-10 DIAGNOSIS — Z7901 Long term (current) use of anticoagulants: Secondary | ICD-10-CM

## 2014-06-10 LAB — POCT INR: INR: 3.5

## 2014-06-15 ENCOUNTER — Ambulatory Visit (HOSPITAL_COMMUNITY)
Admission: RE | Admit: 2014-06-15 | Discharge: 2014-06-15 | Disposition: A | Discharge status: Home / Self Care | Payer: Medicaid Other | Source: Ambulatory Visit | Attending: Cardiology | Admitting: Cardiology

## 2014-06-15 VITALS — BP 102/64 | HR 73 | Wt 157.5 lb

## 2014-06-15 DIAGNOSIS — E785 Hyperlipidemia, unspecified: Secondary | ICD-10-CM | POA: Diagnosis not present

## 2014-06-15 DIAGNOSIS — R0602 Shortness of breath: Secondary | ICD-10-CM

## 2014-06-15 DIAGNOSIS — J189 Pneumonia, unspecified organism: Secondary | ICD-10-CM | POA: Diagnosis not present

## 2014-06-15 DIAGNOSIS — I10 Essential (primary) hypertension: Secondary | ICD-10-CM | POA: Insufficient documentation

## 2014-06-15 DIAGNOSIS — R079 Chest pain, unspecified: Secondary | ICD-10-CM | POA: Diagnosis not present

## 2014-06-15 DIAGNOSIS — I482 Chronic atrial fibrillation, unspecified: Secondary | ICD-10-CM

## 2014-06-15 DIAGNOSIS — Z7901 Long term (current) use of anticoagulants: Secondary | ICD-10-CM | POA: Insufficient documentation

## 2014-06-15 DIAGNOSIS — R0789 Other chest pain: Secondary | ICD-10-CM | POA: Diagnosis not present

## 2014-06-15 DIAGNOSIS — I059 Rheumatic mitral valve disease, unspecified: Secondary | ICD-10-CM

## 2014-06-15 DIAGNOSIS — I5032 Chronic diastolic (congestive) heart failure: Secondary | ICD-10-CM | POA: Insufficient documentation

## 2014-06-15 DIAGNOSIS — I099 Rheumatic heart disease, unspecified: Secondary | ICD-10-CM | POA: Insufficient documentation

## 2014-06-15 DIAGNOSIS — Z7982 Long term (current) use of aspirin: Secondary | ICD-10-CM | POA: Diagnosis not present

## 2014-06-15 DIAGNOSIS — Z952 Presence of prosthetic heart valve: Secondary | ICD-10-CM | POA: Diagnosis not present

## 2014-06-15 MED ORDER — POTASSIUM CHLORIDE CRYS ER 20 MEQ PO TBCR: 40.0000 meq | EXTENDED_RELEASE_TABLET | Freq: Two times a day (BID) | ORAL | Status: DC

## 2014-06-15 NOTE — Progress Notes (Signed)
Patient ID: Deborah [name], female   DOB: 03/30/1951, 63 y.o.   MRN: [id number] PCP: Dr. Casper HarrisonStreet  63 yo with rheumatic heart disease and MV replacement/TV repair, chronic atrial fibrillation, and chronic diastolic CHF.  She returns for follow up. She continues to feel better overall on torsemide.  She walks 30 minutes on the treadmill daily without dyspnea.  She does get short of breath walking up steps.  No orthopnea/PND.  She continues to have episodes of chest heaviness, sometimes with exertion and sometimes at rest.  No definite pattern.  She cancelled her stress test because she was scared about doing it.    Labs (8/12): K 3.5, creatinine 0.98 Labs (10/12): K 3.8, creatinine 1.0, BNP 116 Labs (1/13): K 3.6, creatinine 0.9, LDL 85, HDL 62 Labs (9/13): K 3.9, creatinine 1.0 Labs (4/14): HCT 37.8 with elevated eosinophils, BNP 1094 => 82, K 3.6, creatinine 1.0 Labs (9/14): K 3.6, creatinine 0.89, hemoglobin 11 Labs (1/15): K 3.7, creatinine 0.9, HCT 36.9 Labs (2/15): LDL 99, HDL 50 Labs (3/15): K 4, creatinine 1.28 Labs (6/15): K 3.7, creatinine 1.0, BNP 124 => 80 Labs (02/22/14): K 3.5 Creatinine 0.9 Labs (10/15): K 3.3, creatinine 0.94, HCT 38.9  PMH: 1. Rheumatic heart disease: Status post mechanical MV [facility].  Echo 5/12 with EF 60-65%, mild AI, mechanical MV prosthesis with normal function, severe LAE, moderate RAE, moderate to severe TR, mildly elevated PA systolic pressure.  Echo (6/15) EF 55-60%, normal RV, normal mechanical mitral valve, severe LAE, moderate TR.  2. Chronic atrial fibrillation on coumadin.  Event monitor (7/12-8/12) showed no high rate episodes (patient continuously in atrial fibrillation).  3. Chronic diastolic CHF 4. ETT-myoview (7/12): No evidence for ischemia or infarction.  5. Impaired fasting glucose 6. Hyperlipidemia  7. HTN 8. Gout 9. Anemia 10. External hemorrhoids.  11. Allergic rhinitis 12. Hemoglobin E  trait, hemoglobin H constant spring variant:  mild chronic hemolysis.   SH: Nonsmoker[place]   FH: No premature CAD  ROS: All systems reviewed and negative except as noted in HPI.    Current Outpatient Prescriptions  Medication Sig Dispense Refill  . acetaminophen (TYLENOL) 325 MG tablet Take 650 mg by mouth every 6 (six) hours as needed for pain.       Marland Kitchen. allopurinol (ZYLOPRIM) 300 MG tablet Take 1 tablet (300 mg total) by mouth daily.  30 tablet  5  . aspirin EC 81 MG tablet Take 81 mg by mouth daily.      . Blood Glucose Monitoring Suppl (ACCU-CHEK AVIVA) device Use as instructed  1 each  0  . cetirizine (ZYRTEC) 10 MG tablet Take 10 mg by mouth daily.      . diclofenac sodium (VOLTAREN) 1 % GEL Apply 2 g topically 4 (four) times daily.  100 g  3  . folic acid (FOLVITE) 1 MG tablet Take 1 tablet (1 mg total) by mouth daily.  30 tablet  6  . glucose blood test strip Use as instructed for blood sugar checks twice a day.  100 each  12  . loteprednol (LOTEMAX) 0.2 % SUSP 1 drop 4 (four) times daily.      . metoprolol succinate (TOPROL-XL) 50 MG 24 hr tablet Take 50 mg by mouth daily. Can take extra 1/2 tab for tachycardia      . omeprazole (PRILOSEC) 20 MG capsule Take 1 capsule (20 mg total) by mouth daily.  30 capsule  1  .  potassium chloride SA (K-DUR,KLOR-CON) 20 MEQ tablet Take 2 tablets (40 mEq total) by mouth 2 (two) times daily.  120 tablet  3  . simvastatin (ZOCOR) 10 MG tablet TAKE 1 TABLET (10 MG TOTAL) BY MOUTH EVERY OTHER DAY.  15 tablet  0  . torsemide (DEMADEX) 20 MG tablet TAKE 4 TABLETS (80 MG TOTAL) BY MOUTH DAILY.  120 tablet  0  . traMADol (ULTRAM) 50 MG tablet Take 1 tablet (50 mg total) by mouth every 8 (eight) hours as needed.  50 tablet  1  . warfarin (COUMADIN) 2 MG tablet Take 1 tablet (2 mg total) by mouth as directed.  35 tablet  3   No current facility-administered medications for this encounter.    BP 102/64  Pulse 73  Wt 157 lb 8 oz (71.442  kg)  SpO2 96% General: NAD Neck: JVP  7-8 cm, no thyromegaly or thyroid nodule.  Lungs: Clear to auscultation bilaterally with normal respiratory effort. CV: Nondisplaced PMI.  Heart irregular S1/S2, no S3/S4, mechanical S1, 2/6 HSM LLSB.  No peripheral edema.  No carotid bruit.  Normal pedal pulses.  Abdomen: Soft, nontender, no hepatosplenomegaly, no distention.  Neurologic: Alert and oriented x 3.  Psych: Normal affect. Extremities: No clubbing or cyanosis. R foot medial aspect ecchymotic   Assessment/Plan: 1. Atrial fibrillation: Chronic.  Continue warfarin.  Rate is controlled.    2. Mechanical mitral valve: Continue ASA/coumadin, INR goal 2.5-3.5.  Will need bridging with heparin/Lovenox if has to come off coumadin.  She says that she is going to need a colonoscopy.  This will be fine as long as she is bridged.  3. Diastolic CHF:  Looks near-euvolemic.  - On  torsemide 80 mg daily.  Increase KCl to 40 bid (was low when checked recently).    4. Hyperlipidemia: Continue simvastatin.  5. Chest Pain: Ongoing chest tightness that is primarily atypical.  I will reschedule ETT Cardiolite to assess for ischemia. She is willing to have the test done.    Follow up in 4 months unless stress test abnormal.   Deborah [name] 06/15/2014

## 2014-06-15 NOTE — Patient Instructions (Signed)
Increase Potassium to 40 meq (2 tabs) Twice daily   Your physician has requested that you have en exercise stress myoview. For further information please visit [email or web address]. Please follow instruction sheet, as given.  We will contact you in 4 months to schedule your next appointment.

## 2014-06-21 ENCOUNTER — Encounter (HOSPITAL_COMMUNITY): Payer: Medicaid Other

## 2014-06-22 ENCOUNTER — Encounter (HOSPITAL_COMMUNITY): Payer: Medicaid Other

## 2014-06-29 ENCOUNTER — Ambulatory Visit (INDEPENDENT_AMBULATORY_CARE_PROVIDER_SITE_OTHER): Payer: Medicaid Other | Admitting: *Deleted

## 2014-06-29 DIAGNOSIS — Z954 Presence of other heart-valve replacement: Secondary | ICD-10-CM

## 2014-06-29 DIAGNOSIS — Z952 Presence of prosthetic heart valve: Secondary | ICD-10-CM

## 2014-06-29 DIAGNOSIS — Z7901 Long term (current) use of anticoagulants: Secondary | ICD-10-CM

## 2014-06-29 DIAGNOSIS — Z5181 Encounter for therapeutic drug level monitoring: Secondary | ICD-10-CM

## 2014-06-29 DIAGNOSIS — I059 Rheumatic mitral valve disease, unspecified: Secondary | ICD-10-CM

## 2014-06-29 LAB — POCT INR: INR: 3.3

## 2014-06-30 ENCOUNTER — Ambulatory Visit (INDEPENDENT_AMBULATORY_CARE_PROVIDER_SITE_OTHER): Payer: Medicaid Other | Admitting: Family Medicine

## 2014-06-30 ENCOUNTER — Encounter: Payer: Self-pay | Admitting: Family Medicine

## 2014-06-30 ENCOUNTER — Telehealth: Payer: Self-pay | Admitting: Family Medicine

## 2014-06-30 VITALS — BP 123/72 | HR 84 | Temp 98.4°F | Ht 59.0 in | Wt 158.0 lb

## 2014-06-30 DIAGNOSIS — B9689 Other specified bacterial agents as the cause of diseases classified elsewhere: Secondary | ICD-10-CM | POA: Insufficient documentation

## 2014-06-30 DIAGNOSIS — J069 Acute upper respiratory infection, unspecified: Secondary | ICD-10-CM

## 2014-06-30 MED ORDER — AMOXICILLIN-POT CLAVULANATE 875-125 MG PO TABS: 1.0000 | ORAL_TABLET | Freq: Two times a day (BID) | ORAL | Status: DC

## 2014-06-30 MED ORDER — HYDROCOD POLST-CHLORPHEN POLST 10-8 MG/5ML PO LQCR: 5.0000 mL | Freq: Two times a day (BID) | ORAL | Status: DC | PRN

## 2014-06-30 MED ORDER — PREDNISONE 50 MG PO TABS: ORAL_TABLET | ORAL | Status: AC

## 2014-06-30 NOTE — Telephone Encounter (Signed)
Pt called because the cough syrup that was called in today Medicaid will not pay for and she would like something else called in that Medicaid will pay for. jw

## 2014-06-30 NOTE — Progress Notes (Signed)
   Subjective:    Patient ID: Deborah [name], female    DOB: 03/25/1951, 63 y.o.   MRN: [id number]  HPI Deborah [name] is a 63 y.o. female presents to same-day clinic  Cough with fever:patient presents with an approximate seven-day history of productive cough, fever, chills and wheezing. She denies any sick contacts. She denies any COPD or asthma history. she does have a heart valve replacement.She is a nonsmoker. She denies nausea, vomit, ear pain, eye discomfort or bowel changes. She does admit to fatigue, mild sore throat, nasal congestion  and rhinorrhea and decreased appetite. She has taken Tylenol for fever and pain, and a few pills of left over amoxicillin she had from dental surgery.  non smoker  Past Medical History  Diagnosis Date  . Rheumatic heart disease     Status post mechanical (St. Jude) mitral valve replacement and tricuspid valve repair at [facility] in 2002; echo 5/12:   EF 60-65%, mild AI, mitral valve prosthesis with AVA 1.66, severe LAE, moderate RAE, moderate to severe TR, mild increased pulmonary artery systolic pressure;    Adenosine Cardiolite in 4/09:   No ischemia, EF 66%  . Chronic atrial fibrillation     [facility];  Event montor (7/12-8/12) showed no high rate episodes (patient continuously in atrial fibrillation).   . Chronic diastolic heart failure   . Fatigue   . Borderline diabetes   . Hyperlipidemia   . Obesity   . Anemia   . Gout   . Hypertension   . Carpal tunnel syndrome   . External hemorrhoids   . Rotator cuff syndrome   . 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 Gaylyn RongHa  . 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 Hx of cardiovascular stress test     ETT-myoview (7/12): No evidence for ischemia or infarction  . External hemorrhoid   . Allergic rhinitis   . Eosinophilia 01/27/2013     Review of Systems Per history of present illness    Objective:   Physical Exam BP 123/72 mmHg  Pulse 84  Temp(Src) 98.4 F (36.9  C) (Oral)  Ht 4\' 11"  (1.499 m)  Wt 158 lb (71.668 kg)  BMI 31.89 kg/m2  SpO2 94% Gen: pleasant, no acute distress, nontoxic in appearance, looks fatigued, well-developed, well-nourished HEENT: AT. [place]. Bilateral TM visualized and normal, shiny in appearance. Bilateral eyes without injections or icterus. MMM. Bilateral nares moderate erythema, moderate swelling. Throat without erythema or exudates.  CV: RRR Chest: CTAB, no wheeze or crackles. Anterior rhonchi present Abd: Soft. round. NTND. BS present. no Masses palpated.     Assessment & Plan:

## 2014-06-30 NOTE — Telephone Encounter (Signed)
Medicaid does not cover any prescription cough syrups. She can try OTC things like Cepacol or Robitussin. Please call her to let her know. Will also forward to Dr. Claiborne BillingsKuneff, as she saw pt today for cough and refilled Tussionex, in case other options were discussed with Dr. Claiborne BillingsKuneff that can be called in, instead. --CMS

## 2014-06-30 NOTE — Assessment & Plan Note (Signed)
Patient which appears to be a bacterial upper respiratory infection. Lungs are clear on exam, with the exception of a few rhonchi. Will treat with Augmentin and prednisone, refill on her cough syrup. Follow-up with PCP in 2 weeks

## 2014-06-30 NOTE — Patient Instructions (Signed)
Upper Respiratory Infection, Adult An upper respiratory infection (URI) is also sometimes known as the common cold. The upper respiratory tract includes the nose, sinuses, throat, trachea, and bronchi. Bronchi are the airways leading to the lungs. Most people improve within 1 week, but symptoms can last up to 2 weeks. A residual cough may last even longer.  CAUSES Many different viruses can infect the tissues lining the upper respiratory tract. The tissues become irritated and inflamed and often become very moist. Mucus production is also common. A cold is contagious. You can easily spread the virus to others by oral contact. This includes kissing, sharing a glass, coughing, or sneezing. Touching your mouth or nose and then touching a surface, which is then touched by another person, can also spread the virus. SYMPTOMS  Symptoms typically develop 1 to 3 days after you come in contact with a cold virus. Symptoms vary from person to person. They may include:  Runny nose.  Sneezing.  Nasal congestion.  Sinus irritation.  Sore throat.  Loss of voice (laryngitis).  Cough.  Fatigue.  Muscle aches.  Loss of appetite.  Headache.  Low-grade fever. DIAGNOSIS  You might diagnose your own cold based on familiar symptoms, since most people get a cold 2 to 3 times a year. Your caregiver can confirm this based on your exam. Most importantly, your caregiver can check that your symptoms are not due to another disease such as strep throat, sinusitis, pneumonia, asthma, or epiglottitis. Blood tests, throat tests, and X-rays are not necessary to diagnose a common cold, but they may sometimes be helpful in excluding other more serious diseases. Your caregiver will decide if any further tests are required. RISKS AND COMPLICATIONS  You may be at risk for a more severe case of the common cold if you smoke cigarettes, have chronic heart disease (such as heart failure) or lung disease (such as asthma), or if  you have a weakened immune system. The very young and very old are also at risk for more serious infections. Bacterial sinusitis, middle ear infections, and bacterial pneumonia can complicate the common cold. The common cold can worsen asthma and chronic obstructive pulmonary disease (COPD). Sometimes, these complications can require emergency medical care and may be life-threatening. PREVENTION  The best way to protect against getting a cold is to practice good hygiene. Avoid oral or hand contact with people with cold symptoms. Wash your hands often if contact occurs. There is no clear evidence that vitamin C, vitamin E, echinacea, or exercise reduces the chance of developing a cold. However, it is always recommended to get plenty of rest and practice good nutrition. TREATMENT  Treatment is directed at relieving symptoms. There is no cure. Antibiotics are not effective, because the infection is caused by a virus, not by bacteria. Treatment may include:  Increased fluid intake. Sports drinks offer valuable electrolytes, sugars, and fluids.  Breathing heated mist or steam (vaporizer or shower).  Eating chicken soup or other clear broths, and maintaining good nutrition.  Getting plenty of rest.  Using gargles or lozenges for comfort.  Controlling fevers with ibuprofen or acetaminophen as directed by your caregiver.  Increasing usage of your inhaler if you have asthma. Zinc gel and zinc lozenges, taken in the first 24 hours of the common cold, can shorten the duration and lessen the severity of symptoms. Pain medicines may help with fever, muscle aches, and throat pain. A variety of non-prescription medicines are available to treat congestion and runny nose. Your caregiver   can make recommendations and may suggest nasal or lung inhalers for other symptoms.  HOME CARE INSTRUCTIONS   Only take over-the-counter or prescription medicines for pain, discomfort, or fever as directed by your  caregiver.  Use a warm mist humidifier or inhale steam from a shower to increase air moisture. This may keep secretions moist and make it easier to breathe.  Drink enough water and fluids to keep your urine clear or pale yellow.  Rest as needed.  Return to work when your temperature has returned to normal or as your caregiver advises. You may need to stay home longer to avoid infecting others. You can also use a face mask and careful hand washing to prevent spread of the virus. SEEK MEDICAL CARE IF:   After the first few days, you feel you are getting worse rather than better.  You need your caregiver's advice about medicines to control symptoms.  You develop chills, worsening shortness of breath, or brown or red sputum. These may be signs of pneumonia.  You develop yellow or brown nasal discharge or pain in the face, especially when you bend forward. These may be signs of sinusitis.  You develop a fever, swollen neck glands, pain with swallowing, or white areas in the back of your throat. These may be signs of strep throat. SEEK IMMEDIATE MEDICAL CARE IF:   You have a fever.  You develop severe or persistent headache, ear pain, sinus pain, or chest pain.  You develop wheezing, a prolonged cough, cough up blood, or have a change in your usual mucus (if you have chronic lung disease).  You develop sore muscles or a stiff neck. Document Released: 02/06/2001 Document Revised: 11/05/2011 Document Reviewed: 11/18/2013 [facility] Patient Information 2015 [employer] [place]. This information is not intended to replace advice given to you by your health care provider. Make sure you discuss any questions you have with your health care provider.  Follow-up with your PCP in 2 weeks I have called in Augmentin, prednisone and refilled her cough syrup

## 2014-07-02 ENCOUNTER — Other Ambulatory Visit: Payer: Self-pay | Admitting: Cardiology

## 2014-07-02 DIAGNOSIS — I5022 Chronic systolic (congestive) heart failure: Secondary | ICD-10-CM

## 2014-07-02 MED ORDER — BENZONATATE 100 MG PO CAPS: 100.0000 mg | ORAL_CAPSULE | Freq: Three times a day (TID) | ORAL | Status: DC | PRN

## 2014-07-02 NOTE — Addendum Note (Signed)
Addended by: Bobbye MortonSTREET, Han Vejar M on: 07/02/2014 05:02 PM   Modules accepted: Orders

## 2014-07-02 NOTE — Telephone Encounter (Signed)
Rx sent in for Tessalon. F/u as needed. Thanks. --CMS

## 2014-07-02 NOTE — Telephone Encounter (Signed)
Patient states she has had the tessalon capsules prescribed in the past which she was able to afford, would like to know if this could be called in instead. Will forward to PCP and Dr. Claiborne BillingsKuneff.

## 2014-07-13 ENCOUNTER — Telehealth (HOSPITAL_COMMUNITY): Payer: Self-pay | Admitting: *Deleted

## 2014-07-13 ENCOUNTER — Encounter (HOSPITAL_COMMUNITY): Payer: Self-pay | Admitting: *Deleted

## 2014-07-13 NOTE — Telephone Encounter (Signed)
Letter mailed to pt to call and reschedule stress test

## 2014-07-13 NOTE — Telephone Encounter (Signed)
-----   Message from Pricilla HolmSharon B Ferguson sent at 06/21/2014  3:54 PM EDT ----- Regarding: Tonia Ghentmyoview  Hi Bulah Lurie,  Patient cance Myoview - Per   patient daughter- she will call back some time in Nov to r/s/- will defer x 1 month/saf

## 2014-07-21 ENCOUNTER — Telehealth: Payer: Self-pay | Admitting: Hematology

## 2014-07-21 ENCOUNTER — Ambulatory Visit: Payer: Medicaid Other | Admitting: Family Medicine

## 2014-07-21 NOTE — Telephone Encounter (Signed)
moved from Cp1 12/3 to YF 12/18. s/w pt she is aware and has req AM appt.

## 2014-07-27 ENCOUNTER — Ambulatory Visit (INDEPENDENT_AMBULATORY_CARE_PROVIDER_SITE_OTHER): Payer: Medicaid Other | Admitting: Pharmacist

## 2014-07-27 DIAGNOSIS — I059 Rheumatic mitral valve disease, unspecified: Secondary | ICD-10-CM

## 2014-07-27 DIAGNOSIS — Z954 Presence of other heart-valve replacement: Secondary | ICD-10-CM

## 2014-07-27 DIAGNOSIS — Z7901 Long term (current) use of anticoagulants: Secondary | ICD-10-CM

## 2014-07-27 DIAGNOSIS — Z952 Presence of prosthetic heart valve: Secondary | ICD-10-CM

## 2014-07-27 DIAGNOSIS — Z5181 Encounter for therapeutic drug level monitoring: Secondary | ICD-10-CM

## 2014-07-27 LAB — POCT INR: INR: 3

## 2014-07-29 ENCOUNTER — Ambulatory Visit: Payer: Medicaid Other

## 2014-07-29 ENCOUNTER — Other Ambulatory Visit: Payer: Medicaid Other

## 2014-08-12 ENCOUNTER — Other Ambulatory Visit: Payer: Self-pay | Admitting: *Deleted

## 2014-08-12 DIAGNOSIS — D509 Iron deficiency anemia, unspecified: Secondary | ICD-10-CM

## 2014-08-13 ENCOUNTER — Ambulatory Visit (HOSPITAL_BASED_OUTPATIENT_CLINIC_OR_DEPARTMENT_OTHER): Payer: Medicaid Other | Admitting: Hematology

## 2014-08-13 ENCOUNTER — Other Ambulatory Visit (HOSPITAL_BASED_OUTPATIENT_CLINIC_OR_DEPARTMENT_OTHER): Payer: Medicaid Other

## 2014-08-13 ENCOUNTER — Telehealth: Payer: Self-pay | Admitting: Hematology

## 2014-08-13 VITALS — BP 110/76 | HR 81 | Temp 97.9°F | Resp 18 | Ht 59.0 in | Wt 158.8 lb

## 2014-08-13 DIAGNOSIS — D509 Iron deficiency anemia, unspecified: Secondary | ICD-10-CM

## 2014-08-13 DIAGNOSIS — D582 Other hemoglobinopathies: Secondary | ICD-10-CM

## 2014-08-13 LAB — CBC WITH DIFFERENTIAL/PLATELET
BASO%: 0.9 % (ref 0.0–2.0)
BASOS ABS: 0.1 10*3/uL (ref 0.0–0.1)
EOS%: 2.4 % (ref 0.0–7.0)
Eosinophils Absolute: 0.1 10*3/uL (ref 0.0–0.5)
HCT: 38.8 % (ref 34.8–46.6)
HEMOGLOBIN: 12.3 g/dL (ref 11.6–15.9)
LYMPH#: 2.1 10*3/uL (ref 0.9–3.3)
LYMPH%: 35.6 % (ref 14.0–49.7)
MCH: 23.6 pg — ABNORMAL LOW (ref 25.1–34.0)
MCHC: 31.7 g/dL (ref 31.5–36.0)
MCV: 74.3 fL — AB (ref 79.5–101.0)
MONO#: 0.5 10*3/uL (ref 0.1–0.9)
MONO%: 7.8 % (ref 0.0–14.0)
NEUT#: 3.1 10*3/uL (ref 1.5–6.5)
NEUT%: 53.3 % (ref 38.4–76.8)
Platelets: 163 10*3/uL (ref 145–400)
RBC: 5.22 10*6/uL (ref 3.70–5.45)
RDW: 15.1 % — ABNORMAL HIGH (ref 11.2–14.5)
WBC: 5.8 10*3/uL (ref 3.9–10.3)
nRBC: 0 % (ref 0–0)

## 2014-08-13 LAB — COMPREHENSIVE METABOLIC PANEL (CC13)
ALT: 20 U/L (ref 0–55)
AST: 26 U/L (ref 5–34)
Albumin: 4.1 g/dL (ref 3.5–5.0)
Alkaline Phosphatase: 76 U/L (ref 40–150)
Anion Gap: 10 mEq/L (ref 3–11)
BUN: 18 mg/dL (ref 7.0–26.0)
CALCIUM: 9.3 mg/dL (ref 8.4–10.4)
CO2: 27 mEq/L (ref 22–29)
Chloride: 106 mEq/L (ref 98–109)
Creatinine: 1.1 mg/dL (ref 0.6–1.1)
EGFR: 56 mL/min/{1.73_m2} — ABNORMAL LOW (ref >90)
GLUCOSE: 149 mg/dL — AB (ref 70–140)
Potassium: 3.9 mEq/L (ref 3.5–5.1)
Sodium: 143 mEq/L (ref 136–145)
Total Bilirubin: 1.37 mg/dL — ABNORMAL HIGH (ref 0.20–1.20)
Total Protein: 7.3 g/dL (ref 6.4–8.3)

## 2014-08-13 MED ORDER — CYANOCOBALAMIN 500 MCG PO TABS: 500.0000 ug | ORAL_TABLET | Freq: Every day | ORAL | Status: AC

## 2014-08-13 NOTE — Progress Notes (Signed)
[facility] OFFICE PROGRESS NOTE  Street, Deborah Deerhristopher, MD [address] [place] [id number]  DIAGNOSIS: Other hemoglobinopathies - Plan: CBC & Diff and Retic, Comprehensive metabolic panel (Cmet) - [facility], Lactate dehydrogenase (LDH) - [facility]  CHIEF COMPLAIN: follow up    INTERVAL HISTORY: Deborah [name] 63 y.o. 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  She was last seen by Dr. Rosie Fatehism who has left the practice. She is doing well overall. She denies any pain, cough, or GI symptoms. No ED visit or hospitalization since last visit. She has good energy and appetite, weight stable, she remains physically active.    MEDICAL HISTORY: Past Medical History  Diagnosis Date  . Rheumatic heart disease     Status post mechanical (St. Jude) mitral valve replacement and [facility]; echo 5/12:   EF 60-65%, mild AI, mitral valve prosthesis with AVA 1.66, severe LAE, moderate RAE, moderate to severe TR, mild increased pulmonary artery systolic pressure;    Adenosine Cardiolite in 4/09:   No ischemia, EF 66%  . Chronic atrial fibrillation     [facility];  Event montor (7/12-8/12) showed no high rate episodes (patient continuously in atrial fibrillation).   . Chronic diastolic heart failure   . Fatigue   . Borderline diabetes   . Hyperlipidemia   . Obesity   . Anemia   . Gout   . Hypertension   . Carpal tunnel syndrome   . External hemorrhoids   . Rotator cuff syndrome   . 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 Gaylyn RongHa  . 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 Hx of cardiovascular stress test     ETT-myoview (7/12): No evidence for ischemia or infarction  . External hemorrhoid   . Allergic rhinitis   . 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    ALLERGIES:  is allergic to promethazine hcl and promethazine hcl.  MEDICATIONS: has a current medication list which includes the following prescription(s): allopurinol, aspirin ec, accu-chek aviva, cetirizine, diclofenac sodium, folic acid, glucose blood, loteprednol, metoprolol succinate, omeprazole, potassium chloride sa, simvastatin, torsemide, warfarin, acetaminophen, chlorpheniramine-hydrocodone, cyanocobalamin, and tramadol.  SURGICAL HISTORY:  Past Surgical History  Procedure Laterality Date  . Tricuspid valve repair    . Cardiac catheterization      EF 55%    REVIEW OF SYSTEMS:   Constitutional: Denies fevers, chills or abnormal weight loss Eyes: Denies blurriness of vision Ears, nose, mouth, throat, and face: Denies mucositis or sore throat Respiratory: Denies cough, dyspnea or wheezes Cardiovascular: Denies palpitation, chest discomfort or lower extremity swelling Gastrointestinal:  Denies nausea, heartburn or change in bowel habits Skin: Denies abnormal skin rashes Lymphatics: Denies new lymphadenopathy or easy bruising Neurological:Denies numbness, tingling or new weaknesses Behavioral/Psych: Mood is stable, no new changes  All other systems were reviewed with the patient  and are negative.  PHYSICAL EXAMINATION: ECOG PERFORMANCE  STATUS: 1 - Symptomatic but completely ambulatory  Blood pressure 110/76, pulse 81, temperature 97.9 F (36.6 C), temperature source Oral, resp. rate 18, height 4\' 11"  (1.499 m), weight 158 lb 12.8 oz (72.031 kg), SpO2 96 %.  GENERAL:alert, no distress and comfortable SKIN: skin color, texture, turgor are normal, no rashes or significant lesions EYES: normal, Conjunctiva are pink and non-injected, sclera clear OROPHARYNX:no exudate, no erythema and lips, buccal mucosa, and tongue normal  NECK: supple, thyroid normal size, non-tender, without nodularity LYMPH:  no palpable lymphadenopathy in the cervical, axillary or supraclavicular LUNGS: clear to auscultation with normal breathing effort, no wheezes or rhonchi HEART: regular rate & rhythm and no murmurs and no lower extremity edema ABDOMEN:abdomen soft, non-tender and normal bowel sounds Musculoskeletal:no cyanosis of digits and no clubbing  NEURO: alert & oriented x 3 with fluent speech, no focal motor/sensory deficits  Labs:  Lab Results  Component Value Date   WBC 5.8 08/13/2014   HGB 12.3 08/13/2014   HCT 38.8 08/13/2014   MCV 74.3* 08/13/2014   PLT 163 08/13/2014   NEUTROABS 3.1 08/13/2014   CBC Latest Ref Rng 08/13/2014 04/29/2014 02/04/2014  WBC 3.9 - 10.3 10e3/uL 5.8 6.0 5.6  Hemoglobin 11.6 - 15.9 g/dL 16.112.3 09.612.0 04.512.0  Hematocrit 34.8 - 46.6 % 38.8 38.9 37.6  Platelets 145 - 400 10e3/uL 163 183 179    CMP Latest Ref Rng 08/13/2014 06/02/2014 02/22/2014  Glucose 70 - 140 mg/dl 409(W149(H) 119(J101(H) 478(G122(H)  BUN 7.0 - 26.0 mg/dL 95.618.0 17 20  Creatinine 0.6 - 1.1 mg/dL 1.1 2.130.94 0.9  Sodium 086136 - 145 mEq/L 143 142 139  Potassium 3.5 - 5.1 mEq/L 3.9 3.3(L) 3.5  Chloride 96 - 112 mEq/L - 103 101  CO2 22 - 29 mEq/L 27 30 28   Calcium 8.4 - 10.4 mg/dL 9.3 8.6 9.1  Total Protein 6.4 - 8.3 g/dL 7.3 7.2 -  Total Bilirubin 0.20 - 1.20 mg/dL 5.78(I1.37(H) 6.9(G1.5(H) -  Alkaline Phos 40 - 150 U/L 76 78 -  AST 5 - 34 U/L 26 23 -  ALT 0 - 55 U/L 20 22  -     RADIOGRAPHIC STUDIES: No results found.  ASSESSMENT: Deborah Nai Dillehay 63 y.o. female with a history of Other hemoglobinopathies - Plan: CBC & Diff and Retic, Comprehensive metabolic panel (Cmet) - [facility], Lactate dehydrogenase (LDH) - [facility]   PLAN:   1. Hemoglobin E and Constant Spring:  - stable. 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 No transfusion is indicated. -I recommend B12 and folic acid supplement due to her mild hemolysis process  -We discussed this is a genetic disorder with autosomal dominant pattern, her children should be screened also.  -monitor her CBC every 3-6 month.  2. Transient elevated eosinophil, resolved:  Most likely due to acute infection in April 2014. Since then, her eosinophilia has  normalized. No further work up is indicated.    plan 1. Start B12 500mcg daily, continue folic acid 2. Repeat CBC in 3 month and next visit 3. RTC in 6 month    All questions were answered. The patient knows to call the clinic with any problems, questions or concerns. We can certainly see the patient much sooner if necessary.  I spent 15 minutes counseling the patient face to face. The total time spent in the appointment was 25 minutes.    Malachy MoodFeng, Lakara Weiland, MD 08/13/2014 10:16 AM

## 2014-08-13 NOTE — Telephone Encounter (Signed)
gv pt appt schedule for march/june 2016.

## 2014-08-14 ENCOUNTER — Encounter: Payer: Self-pay | Admitting: Hematology

## 2014-09-07 ENCOUNTER — Ambulatory Visit (INDEPENDENT_AMBULATORY_CARE_PROVIDER_SITE_OTHER): Payer: Medicaid Other | Admitting: *Deleted

## 2014-09-07 DIAGNOSIS — Z5181 Encounter for therapeutic drug level monitoring: Secondary | ICD-10-CM

## 2014-09-07 DIAGNOSIS — Z952 Presence of prosthetic heart valve: Secondary | ICD-10-CM

## 2014-09-07 DIAGNOSIS — Z7901 Long term (current) use of anticoagulants: Secondary | ICD-10-CM

## 2014-09-07 DIAGNOSIS — I059 Rheumatic mitral valve disease, unspecified: Secondary | ICD-10-CM

## 2014-09-07 DIAGNOSIS — Z954 Presence of other heart-valve replacement: Secondary | ICD-10-CM

## 2014-09-07 LAB — POCT INR: INR: 2.6

## 2014-09-09 ENCOUNTER — Ambulatory Visit (INDEPENDENT_AMBULATORY_CARE_PROVIDER_SITE_OTHER): Payer: Medicaid Other | Admitting: Family Medicine

## 2014-09-09 ENCOUNTER — Encounter: Payer: Self-pay | Admitting: Family Medicine

## 2014-09-09 ENCOUNTER — Ambulatory Visit: Payer: Medicaid Other | Admitting: Family Medicine

## 2014-09-09 VITALS — BP 121/83 | HR 93 | Temp 98.2°F | Ht 59.0 in | Wt 161.0 lb

## 2014-09-09 DIAGNOSIS — J209 Acute bronchitis, unspecified: Secondary | ICD-10-CM | POA: Insufficient documentation

## 2014-09-09 MED ORDER — AZITHROMYCIN 250 MG PO TABS: ORAL_TABLET | ORAL | Status: DC

## 2014-09-09 MED ORDER — BENZONATATE 100 MG PO CAPS: 100.0000 mg | ORAL_CAPSULE | Freq: Three times a day (TID) | ORAL | Status: DC | PRN

## 2014-09-09 NOTE — Patient Instructions (Signed)
Dear Deborah [name], Thank you for coming in to clinic today  1. I think that you have a viral respiratory infection, likely from other sick contacts. However, since it has been going on for over 1 week and may be worsening with cough, I have sent in an antibiotic (Z-pak), please try the other OTC treatments first for 1-2 days and do not take Z-pak if feeling better, as this will most likely improve by 10-14 days. 2. Take Mucinex for cough, Nasal saline spray for nasal congestion 3. Take Tylenol / Ibuprofen PRN fever/chills, lozenges / honey tea for throat  Please schedule a follow-up appointment with Dr. Casper HarrisonStreet in 2 weeks if no improvement or sooner if worsening.  If you have any other questions or concerns, please feel free to call the clinic to contact me. You may also schedule an earlier appointment if necessary.  However, if your symptoms get significantly worse, please go to the Emergency Department to seek immediate medical attention.  Saralyn PilarAlexander Karamalegos, DO [facility]

## 2014-09-09 NOTE — Assessment & Plan Note (Signed)
Consistent with worsening / persistent bronchitis in setting of likely viral URI (+sick children contacts at home). Similar URI in 06/2014 completed Augmentin with improvement, but seems to have completely resolved between episodes. Concern with duration >1 week, recurrent episode - Afebrile, no focal signs of infection, lung and throat clear (no suspicion for strep)  Plan: 1. Reassurance, continue supportive measures 2. Stop taking home Amoxicillin 3. Rx Tessalon Perls for cough PRN. Recommend trial OTC - Mucinex, Tylenol/Ibuprofen PRN, Nasal saline, lozenges, tea with honey/lemon 4. Given >1 week duration, recurrent episode, agreed on decision with patient for Azithromycin Z-pak rx to only pick-up and start in 24-48 hours if worsening, otherwise don't take if improving 5. RTC 2 weeks f/u PCP if no improvement, red flags given for return sooner

## 2014-09-09 NOTE — Progress Notes (Signed)
   Subjective:    Patient ID: Deborah [name], female    DOB: 12/12/1950, 64 y.o.   MRN: [id number]  Patient presents for a same day appointment.  HPI  URI / COUGH / SORE THROAT - Reports persistent cough for about 1 week, with worsening in morning with productive cough now with some mild improvement today. Associated symptoms with sore throat now for about 2 days, and  last night felt "feverish and cold" but did not check temperature, took Tylenol 1g x 1 dose last night with improvement, took no doses this AM. Additionally reports concerned that she accidentally drank "1 sip" from someone elses drink at a party about 1.5 weeks ago, felt sick 2-3 days later. - Sick children contacts at home - Took Amoxicillin 500mg  TID x 2 days (left over from dental procedure abx) w/o improvement - Symptoms similar to previous respiratory infection in 06/2014, [facility] given Augmentin x 10 days with resolution. - Admits HA, nasal congestion - Denies any abdominal pain, nausea, vomiting, diarrhea, CP, SOB  HM: - UTD on Influenza and Pneumonia vaccines  I have reviewed and updated the following as appropriate: allergies and current medications  Social Hx: - Never smoker  Review of Systems  See above HPI    Objective:   Physical Exam  BP 121/83 mmHg  Pulse 93  Temp(Src) 98.2 F (36.8 C) (Oral)  Ht 4\' 11"  (1.499 m)  Wt 161 lb (73.029 kg)  BMI 32.50 kg/m2  Gen - mildly sick but non-toxic, overall pleasant and seems comfortable, NAD HEENT - NCAT, minimal b/l frontal sinus tenderness, patent nares w/o congestion, oropharynx with mild postnasal drainage without erythema, edema, exudates, or asymmetry, MMM Neck - supple, non-tender, no LAD Heart - irregularly irregular, no murmurs heard Lungs - CTAB, no wheezing, crackles, or rhonchi. Normal work of breathing. Skin - warm, dry, no rashes     Assessment & Plan:   See specific A&P problem list for details.

## 2014-09-17 ENCOUNTER — Emergency Department (HOSPITAL_COMMUNITY): Payer: Medicaid Other

## 2014-09-17 ENCOUNTER — Encounter (HOSPITAL_COMMUNITY): Payer: Self-pay

## 2014-09-17 ENCOUNTER — Emergency Department (HOSPITAL_COMMUNITY)
Admission: EM | Admit: 2014-09-17 | Discharge: 2014-09-17 | Disposition: A | Discharge status: Home / Self Care | Payer: Medicaid Other | Attending: Emergency Medicine | Admitting: Emergency Medicine

## 2014-09-17 DIAGNOSIS — Z8719 Personal history of other diseases of the digestive system: Secondary | ICD-10-CM | POA: Diagnosis not present

## 2014-09-17 DIAGNOSIS — D649 Anemia, unspecified: Secondary | ICD-10-CM | POA: Insufficient documentation

## 2014-09-17 DIAGNOSIS — I5032 Chronic diastolic (congestive) heart failure: Secondary | ICD-10-CM | POA: Diagnosis not present

## 2014-09-17 DIAGNOSIS — I4891 Unspecified atrial fibrillation: Secondary | ICD-10-CM | POA: Diagnosis not present

## 2014-09-17 DIAGNOSIS — Z8669 Personal history of other diseases of the nervous system and sense organs: Secondary | ICD-10-CM | POA: Diagnosis not present

## 2014-09-17 DIAGNOSIS — T45515A Adverse effect of anticoagulants, initial encounter: Secondary | ICD-10-CM

## 2014-09-17 DIAGNOSIS — Z87828 Personal history of other (healed) physical injury and trauma: Secondary | ICD-10-CM | POA: Diagnosis not present

## 2014-09-17 DIAGNOSIS — R51 Headache: Secondary | ICD-10-CM | POA: Insufficient documentation

## 2014-09-17 DIAGNOSIS — R319 Hematuria, unspecified: Secondary | ICD-10-CM

## 2014-09-17 DIAGNOSIS — Z9889 Other specified postprocedural states: Secondary | ICD-10-CM | POA: Diagnosis not present

## 2014-09-17 DIAGNOSIS — Z7982 Long term (current) use of aspirin: Secondary | ICD-10-CM | POA: Insufficient documentation

## 2014-09-17 DIAGNOSIS — E785 Hyperlipidemia, unspecified: Secondary | ICD-10-CM | POA: Diagnosis not present

## 2014-09-17 DIAGNOSIS — Z79899 Other long term (current) drug therapy: Secondary | ICD-10-CM | POA: Diagnosis not present

## 2014-09-17 DIAGNOSIS — E669 Obesity, unspecified: Secondary | ICD-10-CM | POA: Insufficient documentation

## 2014-09-17 DIAGNOSIS — Z791 Long term (current) use of non-steroidal anti-inflammatories (NSAID): Secondary | ICD-10-CM | POA: Insufficient documentation

## 2014-09-17 DIAGNOSIS — R58 Hemorrhage, not elsewhere classified: Secondary | ICD-10-CM

## 2014-09-17 DIAGNOSIS — M109 Gout, unspecified: Secondary | ICD-10-CM | POA: Diagnosis not present

## 2014-09-17 DIAGNOSIS — R519 Headache, unspecified: Secondary | ICD-10-CM

## 2014-09-17 DIAGNOSIS — I10 Essential (primary) hypertension: Secondary | ICD-10-CM | POA: Diagnosis not present

## 2014-09-17 DIAGNOSIS — Z7901 Long term (current) use of anticoagulants: Secondary | ICD-10-CM | POA: Insufficient documentation

## 2014-09-17 LAB — CBC WITH DIFFERENTIAL/PLATELET
Basophils Absolute: 0.1 10*3/uL (ref 0.0–0.1)
Basophils Relative: 1 % (ref 0–1)
EOS ABS: 0.3 10*3/uL (ref 0.0–0.7)
Eosinophils Relative: 3 % (ref 0–5)
HCT: 39.4 % (ref 36.0–46.0)
Hemoglobin: 12.9 g/dL (ref 12.0–15.0)
Lymphocytes Relative: 31 % (ref 12–46)
Lymphs Abs: 2.7 10*3/uL (ref 0.7–4.0)
MCH: 23.8 pg — ABNORMAL LOW (ref 26.0–34.0)
MCHC: 32.7 g/dL (ref 30.0–36.0)
MCV: 72.6 fL — AB (ref 78.0–100.0)
MONO ABS: 0.6 10*3/uL (ref 0.1–1.0)
Monocytes Relative: 7 % (ref 3–12)
NEUTROS ABS: 4.9 10*3/uL (ref 1.7–7.7)
Neutrophils Relative %: 58 % (ref 43–77)
Platelets: 191 10*3/uL (ref 150–400)
RBC: 5.43 MIL/uL — ABNORMAL HIGH (ref 3.87–5.11)
RDW: 14.7 % (ref 11.5–15.5)
WBC: 8.6 10*3/uL (ref 4.0–10.5)

## 2014-09-17 LAB — PROTIME-INR
INR: 2.02 — ABNORMAL HIGH (ref 0.00–1.49)
PROTHROMBIN TIME: 23 s — AB (ref 11.6–15.2)

## 2014-09-17 LAB — URINALYSIS, ROUTINE W REFLEX MICROSCOPIC
Bilirubin Urine: NEGATIVE
Glucose, UA: NEGATIVE mg/dL
Ketones, ur: NEGATIVE mg/dL
NITRITE: NEGATIVE
Protein, ur: NEGATIVE mg/dL
SPECIFIC GRAVITY, URINE: 1.007 (ref 1.005–1.030)
Urobilinogen, UA: 0.2 mg/dL (ref 0.0–1.0)
pH: 7 (ref 5.0–8.0)

## 2014-09-17 LAB — BASIC METABOLIC PANEL
ANION GAP: 9 (ref 5–15)
BUN: 16 mg/dL (ref 6–23)
CHLORIDE: 102 mmol/L (ref 96–112)
CO2: 30 mmol/L (ref 19–32)
Calcium: 9.5 mg/dL (ref 8.4–10.5)
Creatinine, Ser: 0.98 mg/dL (ref 0.50–1.10)
GFR calc Af Amer: 71 mL/min — ABNORMAL LOW (ref >90)
GFR calc non Af Amer: 61 mL/min — ABNORMAL LOW (ref >90)
Glucose, Bld: 144 mg/dL — ABNORMAL HIGH (ref 70–99)
Potassium: 3.6 mmol/L (ref 3.5–5.1)
Sodium: 141 mmol/L (ref 135–145)

## 2014-09-17 LAB — URINE MICROSCOPIC-ADD ON

## 2014-09-17 MED ORDER — ACETAMINOPHEN 325 MG PO TABS
650.0000 mg | ORAL_TABLET | Freq: Once | ORAL | Status: AC
  Administered 2014-09-17: 650 mg via ORAL
  Filled 2014-09-17: qty 2

## 2014-09-17 NOTE — Discharge Instructions (Signed)
If you were given medicines take as directed.  If you are on coumadin or contraceptives realize their levels and effectiveness is altered by many different medicines.  If you have any reaction (rash, tongues swelling, other) to the medicines stop taking and see a physician.   Please follow up as directed and return to the ER or see a physician for new or worsening symptoms.  Thank you. Filed Vitals:   09/17/14 1201  BP: 138/70  Pulse: 66  Temp: 98.2 F (36.8 C)  TempSrc: Oral  Resp: 16  Height: 4\' 11"  (1.499 m)  Weight: 157 lb (71.215 kg)  SpO2: 96%

## 2014-09-17 NOTE — ED Provider Notes (Signed)
CSN: [id number]     Arrival date & time 09/17/14  1149 History   First MD Initiated Contact with Patient 09/17/14 1152     Chief Complaint  Patient presents with  . Hematuria     (Consider location/radiation/quality/duration/timing/severity/associated sxs/prior Treatment) HPI Comments: 64 year old female with history of atrial fibrillation chronic on Coumadin, valve disorders from rheumatic heart disease, pneumonia, diastolic heart failure presents with hematuria and head injury. Patient hit her head a few days back and has had headache different than previous throbbing on the right side. No neurologic complaints. Patient last night had an episode of bright red blood in her urine, gradually improved with urination episodes and minimal prior to arrival. No history of urologic complaints, no bladder cancer or kidney disorders. Patient feels well otherwise.  Patient is a 64 y.o. female presenting with hematuria. The history is provided by the patient.  Hematuria Associated symptoms include headaches. Pertinent negatives include no chest pain, no abdominal pain and no shortness of breath.    Past Medical History  Diagnosis Date  . Rheumatic heart disease     Status post mechanical ([employer]. Jude) mitral valve replacement and tricuspid valve repair at [facility] in 2002; echo 5/12:   EF 60-65%, mild AI, mitral valve prosthesis with AVA 1.66, severe LAE, moderate RAE, moderate to severe TR, mild increased pulmonary artery systolic pressure;    Adenosine Cardiolite in 4/09:   No ischemia, EF 66%  . Chronic atrial fibrillation     coumadin managed by [facility]. CVRR;  Event montor (7/12-8/12) showed no high rate episodes (patient continuously in atrial fibrillation).   . Chronic diastolic heart failure   . Fatigue   . Borderline diabetes   . Hyperlipidemia   . Obesity   . Anemia   . Gout   . Hypertension   . Carpal tunnel syndrome   . External hemorrhoids   . Rotator cuff syndrome   . 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 Gaylyn Rong  . 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 Hx of cardiovascular stress test     ETT-myoview (7/12): No evidence for ischemia or infarction  . External hemorrhoid   . Allergic rhinitis   . Eosinophilia 01/27/2013   Past Surgical History  Procedure Laterality Date  . Tricuspid valve repair    . Cardiac catheterization      EF 55%   Family History  Problem Relation Age of Onset  . Stroke Mother   . Diabetes Mother   . Heart failure Mother     pacemaker  . Heart disease Father     "heart problems"  . Heart disease Sister   . Heart disease Brother   . Gout Brother   . Diabetes Brother   . Stroke Brother   . Valvular heart disease Sister    History  Substance Use Topics  . Smoking status: Never Smoker   . Smokeless tobacco: Never Used  . Alcohol Use: No   OB History    No data available     Review of Systems  Constitutional: Negative for fever and chills.  HENT: Negative for congestion.   Eyes: Negative for visual disturbance.  Respiratory: Negative for shortness of breath.   Cardiovascular: Negative for chest pain.  Gastrointestinal: Negative for vomiting and abdominal pain.  Genitourinary: Positive for hematuria. Negative for dysuria and flank pain.  Musculoskeletal: Negative for back pain, neck pain and neck stiffness.  Skin: Negative for rash.  Neurological: Positive for headaches. Negative  for weakness, light-headedness and numbness.      Allergies  Promethazine hcl and Promethazine hcl  Home Medications   Prior to Admission medications   Medication Sig Start Date End Date Taking? Authorizing Provider  acetaminophen (TYLENOL) 325 MG tablet Take 650 mg by mouth every 6 (six) hours as needed for pain.     Historical Provider, MD  allopurinol (ZYLOPRIM) 300 MG tablet Take 1 tablet (300 mg total) by mouth daily. 06/02/14   Stephanie Couphristopher M Street, MD  aspirin EC 81 MG tablet Take 81 mg by mouth daily.    Historical Provider, MD   azithromycin (ZITHROMAX Z-PAK) 250 MG tablet Take 2 tablets day 1, then take 1 daily x 4 days 09/09/14   Saralyn PilarAlexander Karamalegos, DO  benzonatate (TESSALON PERLES) 100 MG capsule Take 1 capsule (100 mg total) by mouth 3 (three) times daily as needed for cough. 09/09/14   Saralyn PilarAlexander Karamalegos, DO  Blood Glucose Monitoring Suppl (ACCU-CHEK AVIVA) device Use as instructed 06/03/14 06/03/15  Stephanie Couphristopher M Street, MD  cetirizine (ZYRTEC) 10 MG tablet Take 10 mg by mouth daily.    Historical Provider, MD  cyanocobalamin 500 MCG tablet Take 1 tablet (500 mcg total) by mouth daily. 08/13/14   Malachy MoodYan Feng, MD  diclofenac sodium (VOLTAREN) 1 % GEL Apply 2 g topically 4 (four) times daily. 06/02/14   Stephanie Couphristopher M Street, MD  folic acid (FOLVITE) 1 MG tablet Take 1 tablet (1 mg total) by mouth daily. 06/02/14   Stephanie Couphristopher M Street, MD  glucose blood test strip Use as instructed for blood sugar checks twice a day. 06/02/14   Stephanie Couphristopher M Street, MD  loteprednol (LOTEMAX) 0.2 % SUSP 1 drop 4 (four) times daily.    Historical Provider, MD  metoprolol succinate (TOPROL-XL) 50 MG 24 hr tablet Take 50 mg by mouth daily. Can take extra 1/2 tab for tachycardia 06/29/13   Laurey Moralealton S McLean, MD  omeprazole (PRILOSEC) 20 MG capsule Take 1 capsule (20 mg total) by mouth daily. 06/02/14   Stephanie Couphristopher M Street, MD  potassium chloride SA (K-DUR,KLOR-CON) 20 MEQ tablet Take 2 tablets (40 mEq total) by mouth 2 (two) times daily. 06/15/14   Laurey Moralealton S McLean, MD  simvastatin (ZOCOR) 10 MG tablet TAKE 1 TABLET (10 MG TOTAL) BY MOUTH EVERY OTHER DAY. 07/08/14   Dolores Pattyaniel R Bensimhon, MD  torsemide (DEMADEX) 20 MG tablet TAKE 4 TABLETS (80 MG TOTAL) BY MOUTH DAILY. 07/08/14   Dolores Pattyaniel R Bensimhon, MD  traMADol (ULTRAM) 50 MG tablet Take 1 tablet (50 mg total) by mouth every 8 (eight) hours as needed. 06/02/14   Stephanie Couphristopher M Street, MD  warfarin (COUMADIN) 2 MG tablet Take 1 tablet (2 mg total) by mouth as directed. 03/26/14   Laurey Moralealton S McLean, MD    BP 116/63 mmHg  Pulse 70  Temp(Src) 98.2 F (36.8 C) (Oral)  Resp 16  Ht 4\' 11"  (1.499 m)  Wt 157 lb (71.215 kg)  BMI 31.69 kg/m2  SpO2 95% Physical Exam  Constitutional: She is oriented to person, place, and time. She appears well-developed and well-nourished.  HENT:  Head: Normocephalic and atraumatic.  Eyes: Conjunctivae are normal. Right eye exhibits no discharge. Left eye exhibits no discharge.  Neck: Normal range of motion. Neck supple. No tracheal deviation present.  Cardiovascular: Normal rate and regular rhythm.   Pulmonary/Chest: Effort normal and breath sounds normal.  Abdominal: Soft. She exhibits no distension. There is no tenderness. There is no guarding.  Musculoskeletal: She exhibits no edema.  Neurological: She is alert and oriented to person, place, and time. She has normal strength. No cranial nerve deficit or sensory deficit. GCS eye subscore is 4. GCS verbal subscore is 5. GCS motor subscore is 6.  Skin: Skin is warm. No rash noted.  Psychiatric: She has a normal mood and affect.  Nursing note and vitals reviewed.   ED Course  Procedures (including critical care time) Labs Review Labs Reviewed  CBC WITH DIFFERENTIAL - Abnormal; Notable for the following:    RBC 5.43 (*)    MCV 72.6 (*)    MCH 23.8 (*)    All other components within normal limits  URINALYSIS, ROUTINE W REFLEX MICROSCOPIC - Abnormal; Notable for the following:    Hgb urine dipstick LARGE (*)    Leukocytes, UA TRACE (*)    All other components within normal limits  PROTIME-INR - Abnormal; Notable for the following:    Prothrombin Time 23.0 (*)    INR 2.02 (*)    All other components within normal limits  URINE MICROSCOPIC-ADD ON  BASIC METABOLIC PANEL    Imaging Review Ct Head Wo Contrast  09/17/2014   CLINICAL DATA:  Headaches with light sensitivity for 1 week  EXAM: CT HEAD WITHOUT CONTRAST  TECHNIQUE: Contiguous axial images were obtained from the base of the skull through the  vertex without intravenous contrast.  COMPARISON:  11/28/2011  FINDINGS: There is no evidence of mass effect, midline shift or extra-axial fluid collections. There is no evidence of a space-occupying lesion or intracranial hemorrhage. There is no evidence of a cortical-based area of acute infarction.  The ventricles and sulci are appropriate for the patient's age. The basal cisterns are patent.  Visualized portions of the orbits are unremarkable. The mastoid sinuses are clear. There is mild left sphenoid sinus mucosal thickening.  The osseous structures are unremarkable.  IMPRESSION: Normal CT of the brain without intravenous contrast.   Electronically Signed   By: Elige Ko   On: 09/17/2014 12:43     EKG Interpretation None      MDM   Final diagnoses:  Acute headache  Bleeding on Coumadin  Hematuria    Patient on Coumadin for atrial fibrillation presents with hematuria, hemoglobin normal in ER, bleeding is minimal currently. Discussed outpatient follow-up urology for further workup.  Patient had head injury a few days back with atypical headache, since patient is on Coumadin CT head performed no acute bleeding. Patient has normal neuro exam.  Patient stable for outpatient follow-up  Results and differential diagnosis were discussed with the patient/parent/guardian. Close follow up outpatient was discussed, comfortable with the plan.   Medications  acetaminophen (TYLENOL) tablet 650 mg (650 mg Oral Given 09/17/14 1226)    Filed Vitals:   09/17/14 1201 09/17/14 1215  BP: 138/70 116/63  Pulse: 66 70  Temp: 98.2 F (36.8 C)   TempSrc: Oral   Resp: 16   Height:  (1.499 m)   Weight: 157 lb (71.215 kg)   SpO2: 96% 95%    Final diagnoses:  Acute headache  Bleeding on Coumadin  Hematuria        Enid Skeens, MD 09/17/14 1254

## 2014-09-17 NOTE — ED Notes (Signed)
Pt started having blood in her urine last evening at 2200 and mild headaches. Pt had blood in urine again at 0300 and 1000 this morning. Pt is on coumadin

## 2014-09-23 ENCOUNTER — Other Ambulatory Visit (HOSPITAL_COMMUNITY): Payer: Self-pay | Admitting: *Deleted

## 2014-09-23 DIAGNOSIS — I482 Chronic atrial fibrillation, unspecified: Secondary | ICD-10-CM

## 2014-09-23 DIAGNOSIS — J189 Pneumonia, unspecified organism: Secondary | ICD-10-CM

## 2014-09-23 DIAGNOSIS — R0602 Shortness of breath: Secondary | ICD-10-CM

## 2014-09-23 DIAGNOSIS — I5032 Chronic diastolic (congestive) heart failure: Secondary | ICD-10-CM

## 2014-09-23 MED ORDER — POTASSIUM CHLORIDE CRYS ER 20 MEQ PO TBCR: 40.0000 meq | EXTENDED_RELEASE_TABLET | Freq: Two times a day (BID) | ORAL | Status: DC

## 2014-09-24 ENCOUNTER — Other Ambulatory Visit: Payer: Self-pay | Admitting: Family Medicine

## 2014-10-06 ENCOUNTER — Ambulatory Visit (INDEPENDENT_AMBULATORY_CARE_PROVIDER_SITE_OTHER): Payer: Medicaid Other | Admitting: *Deleted

## 2014-10-06 DIAGNOSIS — Z954 Presence of other heart-valve replacement: Secondary | ICD-10-CM

## 2014-10-06 DIAGNOSIS — Z7901 Long term (current) use of anticoagulants: Secondary | ICD-10-CM

## 2014-10-06 DIAGNOSIS — I059 Rheumatic mitral valve disease, unspecified: Secondary | ICD-10-CM

## 2014-10-06 DIAGNOSIS — Z5181 Encounter for therapeutic drug level monitoring: Secondary | ICD-10-CM

## 2014-10-06 DIAGNOSIS — Z952 Presence of prosthetic heart valve: Secondary | ICD-10-CM

## 2014-10-06 LAB — POCT INR: INR: 3.5

## 2014-10-22 ENCOUNTER — Other Ambulatory Visit (HOSPITAL_COMMUNITY): Payer: Self-pay | Admitting: *Deleted

## 2014-10-22 MED ORDER — ALLOPURINOL 300 MG PO TABS: 300.0000 mg | ORAL_TABLET | Freq: Every day | ORAL | Status: DC

## 2014-11-01 ENCOUNTER — Other Ambulatory Visit (HOSPITAL_COMMUNITY): Payer: Self-pay | Admitting: *Deleted

## 2014-11-01 DIAGNOSIS — I5022 Chronic systolic (congestive) heart failure: Secondary | ICD-10-CM

## 2014-11-01 MED ORDER — TORSEMIDE 20 MG PO TABS: ORAL_TABLET | ORAL | Status: DC

## 2014-11-12 ENCOUNTER — Other Ambulatory Visit (HOSPITAL_BASED_OUTPATIENT_CLINIC_OR_DEPARTMENT_OTHER): Payer: Medicaid Other

## 2014-11-12 DIAGNOSIS — D582 Other hemoglobinopathies: Secondary | ICD-10-CM

## 2014-11-12 LAB — COMPREHENSIVE METABOLIC PANEL (CC13)
ALBUMIN: 4.1 g/dL (ref 3.5–5.0)
ALK PHOS: 88 U/L (ref 40–150)
ALT: 25 U/L (ref 0–55)
AST: 25 U/L (ref 5–34)
Anion Gap: 10 mEq/L (ref 3–11)
BUN: 17.6 mg/dL (ref 7.0–26.0)
CO2: 25 mEq/L (ref 22–29)
Calcium: 9.3 mg/dL (ref 8.4–10.4)
Chloride: 106 mEq/L (ref 98–109)
Creatinine: 0.9 mg/dL (ref 0.6–1.1)
EGFR: 72 mL/min/{1.73_m2} — AB (ref >90)
Glucose: 140 mg/dl (ref 70–140)
Potassium: 3.8 mEq/L (ref 3.5–5.1)
SODIUM: 141 meq/L (ref 136–145)
TOTAL PROTEIN: 7.4 g/dL (ref 6.4–8.3)
Total Bilirubin: 1.46 mg/dL — ABNORMAL HIGH (ref 0.20–1.20)

## 2014-11-12 LAB — CBC & DIFF AND RETIC
BASO%: 1.2 % (ref 0.0–2.0)
Basophils Absolute: 0.1 10*3/uL (ref 0.0–0.1)
EOS ABS: 0.2 10*3/uL (ref 0.0–0.5)
EOS%: 3.1 % (ref 0.0–7.0)
HCT: 38.6 % (ref 34.8–46.6)
HGB: 12.4 g/dL (ref 11.6–15.9)
Immature Retic Fract: 15.7 % — ABNORMAL HIGH (ref 1.60–10.00)
LYMPH%: 36.9 % (ref 14.0–49.7)
MCH: 23.9 pg — ABNORMAL LOW (ref 25.1–34.0)
MCHC: 32.1 g/dL (ref 31.5–36.0)
MCV: 74.4 fL — ABNORMAL LOW (ref 79.5–101.0)
MONO#: 0.5 10*3/uL (ref 0.1–0.9)
MONO%: 8.4 % (ref 0.0–14.0)
NEUT#: 3.1 10*3/uL (ref 1.5–6.5)
NEUT%: 50.4 % (ref 38.4–76.8)
PLATELETS: 176 10*3/uL (ref 145–400)
RBC: 5.19 10*6/uL (ref 3.70–5.45)
RDW: 15.5 % — ABNORMAL HIGH (ref 11.2–14.5)
Retic %: 3.26 % — ABNORMAL HIGH (ref 0.70–2.10)
Retic Ct Abs: 169.19 10*3/uL — ABNORMAL HIGH (ref 33.70–90.70)
WBC: 6.1 10*3/uL (ref 3.9–10.3)
lymph#: 2.2 10*3/uL (ref 0.9–3.3)

## 2014-11-12 LAB — LACTATE DEHYDROGENASE (CC13): LDH: 368 U/L — ABNORMAL HIGH (ref 125–245)

## 2014-11-17 ENCOUNTER — Ambulatory Visit (INDEPENDENT_AMBULATORY_CARE_PROVIDER_SITE_OTHER): Payer: Medicaid Other | Admitting: *Deleted

## 2014-11-17 DIAGNOSIS — Z954 Presence of other heart-valve replacement: Secondary | ICD-10-CM | POA: Diagnosis not present

## 2014-11-17 DIAGNOSIS — Z7901 Long term (current) use of anticoagulants: Secondary | ICD-10-CM

## 2014-11-17 DIAGNOSIS — I059 Rheumatic mitral valve disease, unspecified: Secondary | ICD-10-CM

## 2014-11-17 DIAGNOSIS — Z952 Presence of prosthetic heart valve: Secondary | ICD-10-CM

## 2014-11-17 DIAGNOSIS — Z5181 Encounter for therapeutic drug level monitoring: Secondary | ICD-10-CM | POA: Diagnosis not present

## 2014-11-17 LAB — POCT INR: INR: 3.4

## 2014-11-21 IMAGING — CR DG HAND COMPLETE*R*
3 series · 3 of 3 positions shown · non-contrast
Comparison: None.

CLINICAL DATA: Right hand pain for 2-3 months.

RIGHT HAND - COMPLETE 3+ VIEW

[x hand pa right]
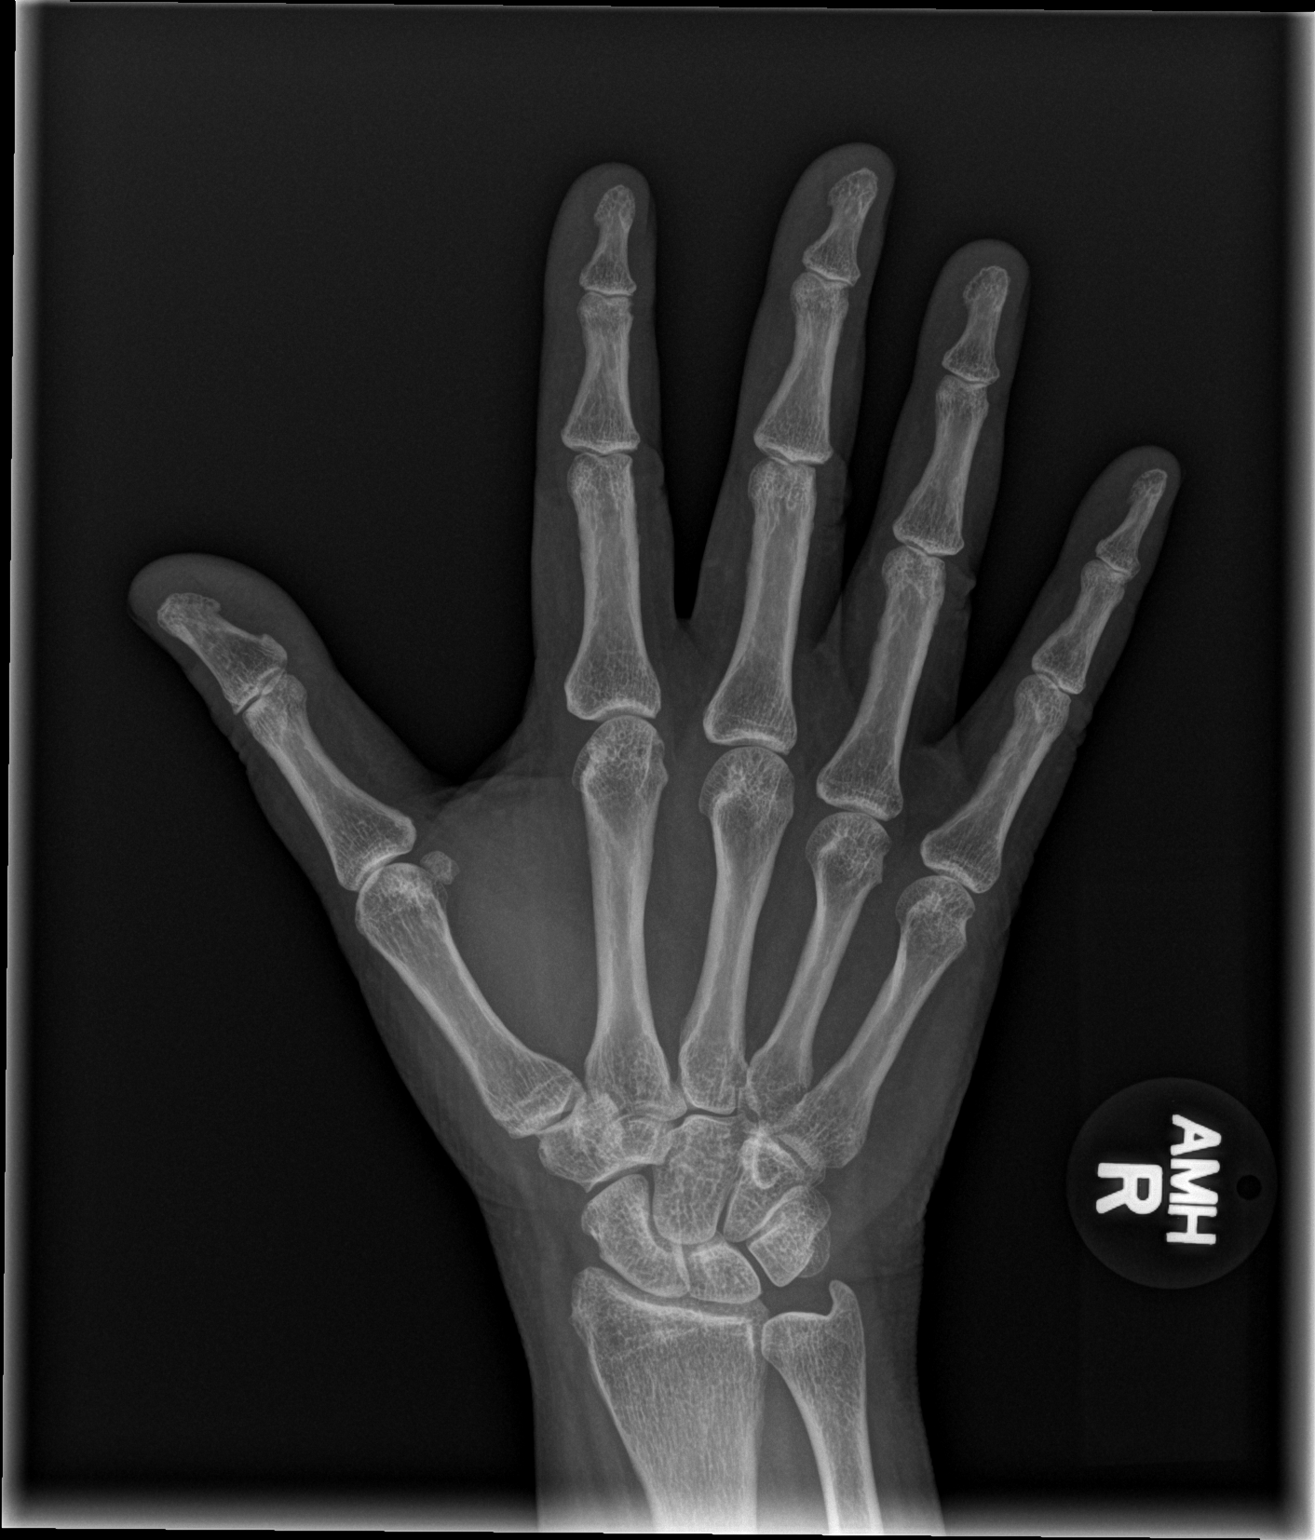

[x hand obl right]
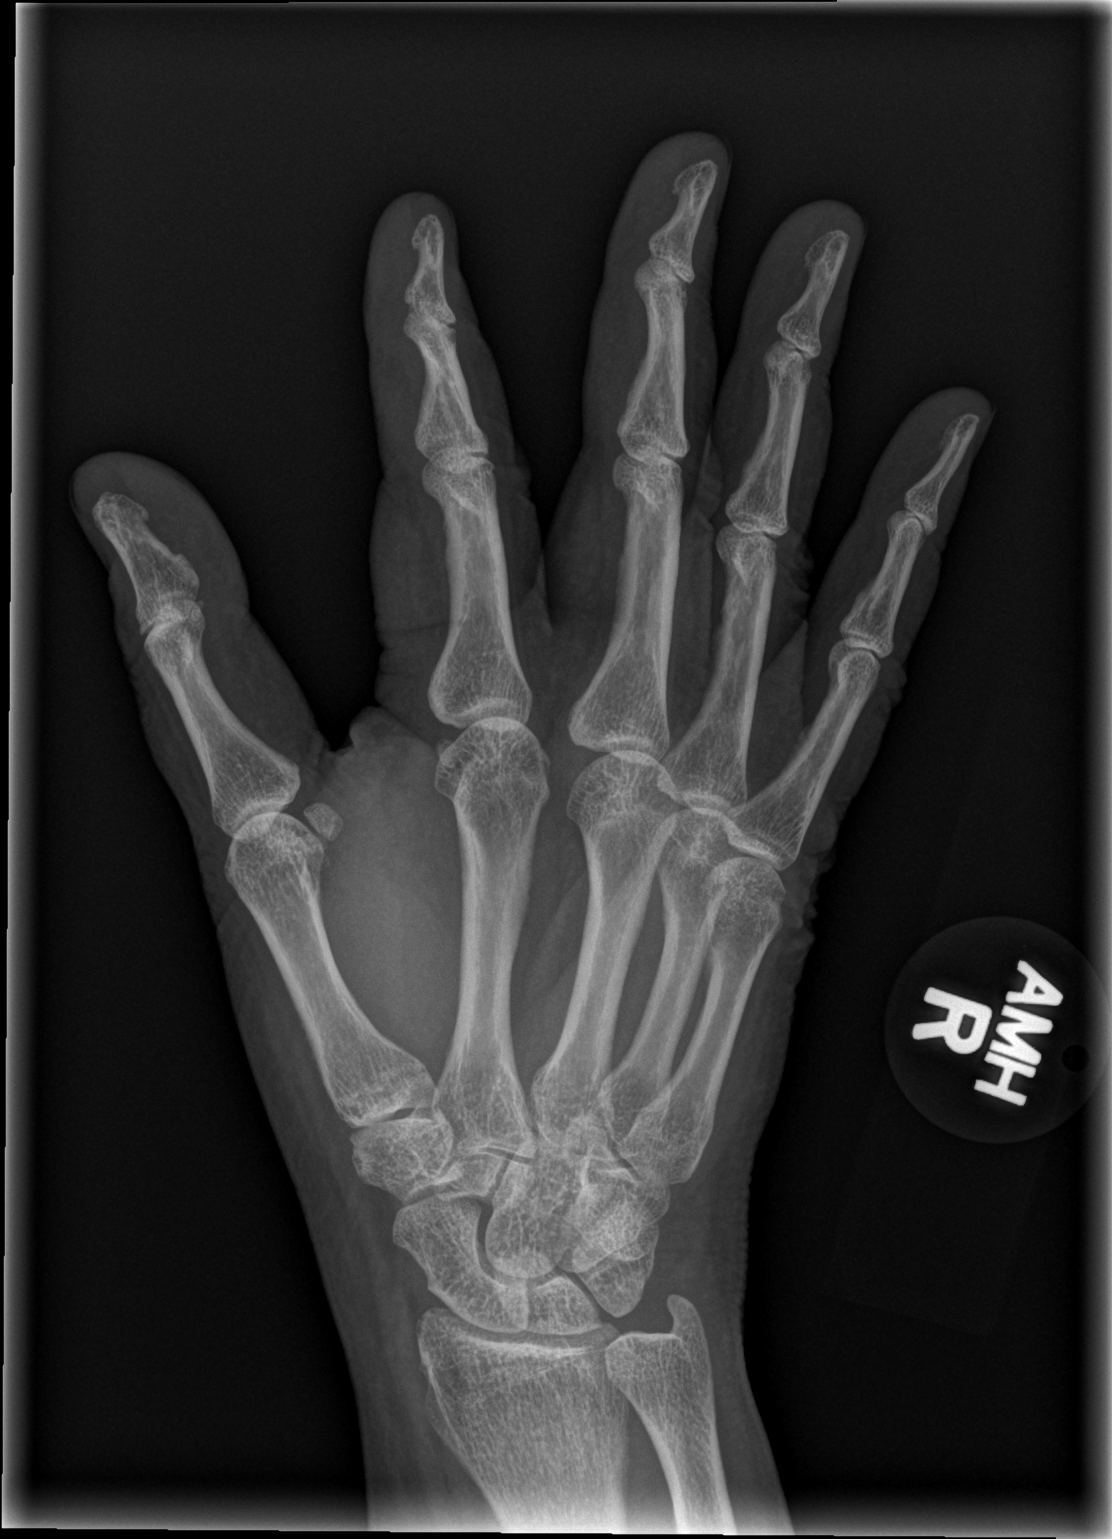

[x hand lat right]
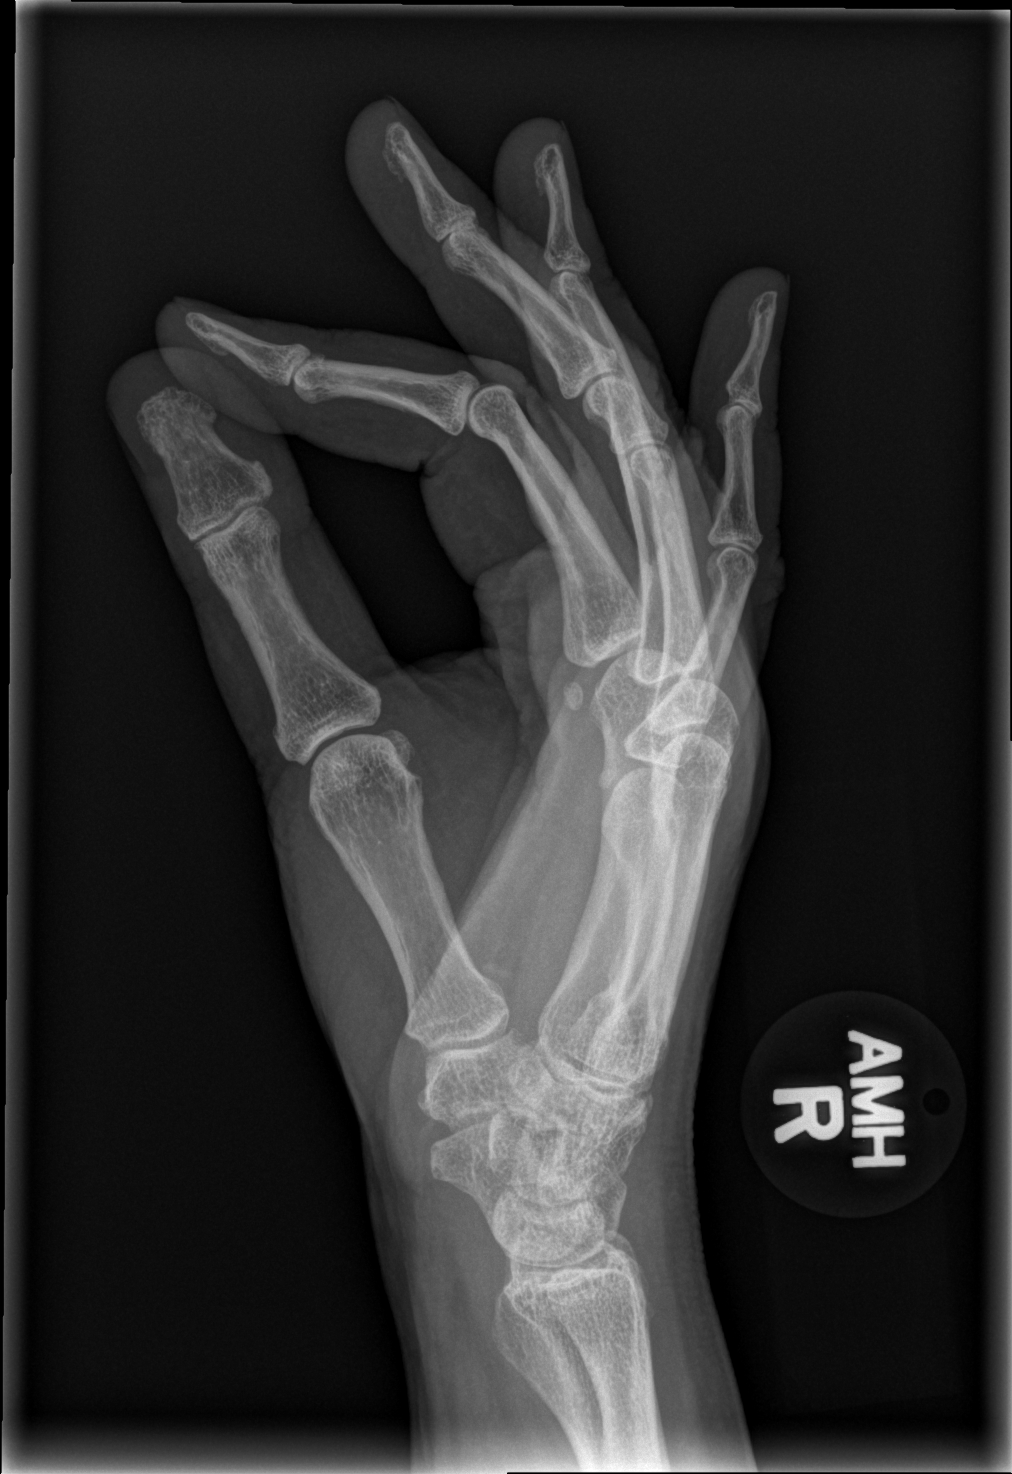

[3 of 3 positions shown; findings below may reference images not displayed]

FINDINGS: Three views of the right hand were obtained.  There is
normal alignment.  No evidence for acute fracture or dislocation.
No significant soft tissue swelling.  No significant joint space
narrowing or degenerative changes.
IMPRESSION: No acute bony abnormality.

## 2014-11-23 ENCOUNTER — Other Ambulatory Visit (HOSPITAL_COMMUNITY): Payer: Self-pay | Admitting: *Deleted

## 2014-11-23 DIAGNOSIS — I5022 Chronic systolic (congestive) heart failure: Secondary | ICD-10-CM

## 2014-11-23 MED ORDER — SIMVASTATIN 10 MG PO TABS: ORAL_TABLET | ORAL | Status: DC

## 2014-12-29 ENCOUNTER — Ambulatory Visit (INDEPENDENT_AMBULATORY_CARE_PROVIDER_SITE_OTHER): Payer: Medicaid Other | Admitting: Family Medicine

## 2014-12-29 ENCOUNTER — Ambulatory Visit (INDEPENDENT_AMBULATORY_CARE_PROVIDER_SITE_OTHER): Payer: Medicaid Other | Admitting: *Deleted

## 2014-12-29 ENCOUNTER — Encounter: Payer: Self-pay | Admitting: Family Medicine

## 2014-12-29 VITALS — BP 111/74 | HR 74 | Temp 97.8°F | Ht 59.0 in | Wt 161.3 lb

## 2014-12-29 DIAGNOSIS — I059 Rheumatic mitral valve disease, unspecified: Secondary | ICD-10-CM

## 2014-12-29 DIAGNOSIS — R1011 Right upper quadrant pain: Secondary | ICD-10-CM

## 2014-12-29 DIAGNOSIS — E118 Type 2 diabetes mellitus with unspecified complications: Secondary | ICD-10-CM

## 2014-12-29 DIAGNOSIS — Z5181 Encounter for therapeutic drug level monitoring: Secondary | ICD-10-CM | POA: Diagnosis not present

## 2014-12-29 DIAGNOSIS — Z954 Presence of other heart-valve replacement: Secondary | ICD-10-CM | POA: Diagnosis not present

## 2014-12-29 DIAGNOSIS — Z7901 Long term (current) use of anticoagulants: Secondary | ICD-10-CM

## 2014-12-29 DIAGNOSIS — Z952 Presence of prosthetic heart valve: Secondary | ICD-10-CM

## 2014-12-29 DIAGNOSIS — Z23 Encounter for immunization: Secondary | ICD-10-CM | POA: Diagnosis not present

## 2014-12-29 DIAGNOSIS — Z0000 Encounter for general adult medical examination without abnormal findings: Secondary | ICD-10-CM | POA: Diagnosis present

## 2014-12-29 LAB — POCT GLYCOSYLATED HEMOGLOBIN (HGB A1C): Hemoglobin A1C: 6.2

## 2014-12-29 LAB — COMPREHENSIVE METABOLIC PANEL
ALK PHOS: 67 U/L (ref 39–117)
ALT: 31 U/L (ref 0–35)
AST: 27 U/L (ref 0–37)
Albumin: 4.7 g/dL (ref 3.5–5.2)
BUN: 18 mg/dL (ref 6–23)
CO2: 26 mEq/L (ref 19–32)
CREATININE: 0.93 mg/dL (ref 0.50–1.10)
Calcium: 9.2 mg/dL (ref 8.4–10.5)
Chloride: 103 mEq/L (ref 96–112)
Glucose, Bld: 132 mg/dL — ABNORMAL HIGH (ref 70–99)
Potassium: 3.9 mEq/L (ref 3.5–5.3)
Sodium: 141 mEq/L (ref 135–145)
Total Bilirubin: 1.5 mg/dL — ABNORMAL HIGH (ref 0.2–1.2)
Total Protein: 7.5 g/dL (ref 6.0–8.3)

## 2014-12-29 LAB — CBC WITH DIFFERENTIAL/PLATELET
BASOS ABS: 0.1 10*3/uL (ref 0.0–0.1)
BASOS PCT: 1 % (ref 0–1)
Eosinophils Absolute: 0.2 10*3/uL (ref 0.0–0.7)
Eosinophils Relative: 3 % (ref 0–5)
HCT: 38.1 % (ref 36.0–46.0)
Hemoglobin: 12 g/dL (ref 12.0–15.0)
LYMPHS PCT: 36 % (ref 12–46)
Lymphs Abs: 2.2 10*3/uL (ref 0.7–4.0)
MCH: 23.1 pg — ABNORMAL LOW (ref 26.0–34.0)
MCHC: 31.5 g/dL (ref 30.0–36.0)
MCV: 73.4 fL — AB (ref 78.0–100.0)
MONO ABS: 0.6 10*3/uL (ref 0.1–1.0)
Monocytes Relative: 9 % (ref 3–12)
NEUTROS PCT: 51 % (ref 43–77)
Neutro Abs: 3.2 10*3/uL (ref 1.7–7.7)
Platelets: 179 10*3/uL (ref 150–400)
RBC: 5.19 MIL/uL — ABNORMAL HIGH (ref 3.87–5.11)
RDW: 15.6 % — AB (ref 11.5–15.5)
WBC: 6.2 10*3/uL (ref 4.0–10.5)

## 2014-12-29 LAB — POCT INR: INR: 2.3

## 2014-12-29 NOTE — Patient Instructions (Signed)
Thank you for coming in, today!  I'm not sure what is causing your pain, but I think it could be your gallbladder. I will check several labs today and call you or write you a letter with the results. I will order an ultrasound of your abdomen, as well, to look at your liver and gallbladder. If Tylenol is helping your pain, you can continue to use it.  Avoid large, heavy meals, fatty or greasy foods, or very spicy foods. If you have very severe pain, bad vomiting, fevers, or otherwise worse symptoms, come back here or go to the emergency room.  If it looks like you have gallbladder trouble on your labs and ultrasound, I will refer you to the surgeons. Otherwise, call me as you need. My last day is June 30th, but I will be happy to see you any time between now and then. After that, you'll have a different doctor here in this same building. Please feel free to call with any questions or concerns at any time, at [phone]. --Dr. Casper HarrisonStreet

## 2014-12-29 NOTE — Progress Notes (Signed)
   Subjective:    Patient ID: Deborah [name], female    DOB: 06/23/1951, 64 y.o.   MRN: [id number]  HPI: Pt presents to clinic for right-sided abdominal pain for about 2 weeks. The pain started in the morning and lasted throughout the day for about 1 week, without a specific trigger. She has never had pain like this before. Her pain is described as an ache, in her right upper abdomen that radiates down into her side. She does not eat big meals or spicy foods so she can't say if that makes her pain worse. She has taken Tylenol with some relief and does not have bad pain today. She has taken no other medications. She is concerned for her liver because she knows people from [place] "in her community" have had hepatitis in the past. She does not think she has ever had problems with her gallbladder. She has no frank heartburn or other reflux-type symptoms. She has had no change in her bowel movements or unusual weight loss.  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 She has had no frank bleeding or blood in her stool.  Review of Systems: As above.     Objective:   Physical Exam BP 111/74 mmHg  Pulse 74  Temp(Src) 97.8 F (36.6 C) (Oral)  Ht 4\' 11"  (1.499 m)  Wt 161 lb 4.8 oz (73.165 kg)  BMI 32.56 kg/m2 Gen: elderly adult female in NAD HEENT: [place]/AT, EOMI, PERRLA, MMM, TM's clear bilaterally  Nasal mucosae and posterior oropharynx clear Neck: supple, normal ROM, no lymphadenopathy Cardio: hx of a-fib but appears in sinus rhythm per auscultation, no murmur appreciated Pulm: CTAB, no wheezes, normal WOB Abd: soft, moderately tender in RUQ and slightly less so in the RLQ, BS+  No frank hepatosplenomegaly noted  Equivocal Murphy sign with very deep palpation  Negative McBurney point tenderness Ext: warm, well-perfused, no LE edema Skin: no frank rashes noted in area of  tenderness or elsewhere     Assessment & Plan:  64yo female with RUQ pain suspicious for gallbladder pathology - DDx broad, including other biliary-related issues, hepatitis / pancreatitis, MSK or abdominal wall pain, constipation, etc - strongly doubt frank malignancy, irritable bowel syndrome, or inflammatory bowel disease - overall well-appearing so doubt acute dangerous pathology and no apparent indication for urgent work-up or surgical consultation  Plan: - will check CBC, CMP, and hepatitis panel (incidentally will check Hb A1c, as well) - ordered for complete abd US - continue Tylenol PRN - depending on results of the above, will consider surgical consultation for gallbladder removal if indicated - otherwise may consider further imaging, labs, and / or GI referral - f/u as needed, otherwise  Bobbye Mortonhristopher M Kynslee Baham, MD PGY-3, [facility] 12/29/2014, 10:33 AM

## 2014-12-30 ENCOUNTER — Encounter: Payer: Self-pay | Admitting: Family Medicine

## 2014-12-30 LAB — ACUTE HEP PANEL AND HEP B SURFACE AB
HCV AB: NEGATIVE
Hep A IgM: NONREACTIVE
Hep B C IgM: NONREACTIVE
Hep B S Ab: POSITIVE — AB
Hepatitis B Surface Ag: NEGATIVE

## 2014-12-31 NOTE — Progress Notes (Signed)
I was preceptor for this office visit.  

## 2015-01-04 ENCOUNTER — Ambulatory Visit (HOSPITAL_COMMUNITY)
Admission: RE | Admit: 2015-01-04 | Discharge: 2015-01-04 | Disposition: A | Discharge status: Home / Self Care | Payer: Medicaid Other | Source: Ambulatory Visit | Attending: Family Medicine | Admitting: Family Medicine

## 2015-01-04 ENCOUNTER — Telehealth: Payer: Self-pay | Admitting: Family Medicine

## 2015-01-04 DIAGNOSIS — R1011 Right upper quadrant pain: Secondary | ICD-10-CM | POA: Diagnosis present

## 2015-01-04 NOTE — Telephone Encounter (Signed)
Called pt to discuss abd US; normal except for some ?fatty liver vs hepatocellular disease (heterogenous echotexture). Normal-appearing gallbladder and biliary ducts. Per pt, abdominal pain has much improved and she feels "much better" without any specific complaints.  Advised continued self-monitoring and use of low-dose Tylenol as that has helped the pain significantly (see recent office note). Advised pt to call or return to clinic if she has any persistent or recurrent pain, as we would consider further imaging or testing at that point (?abd CT with contrast). Otherwise strongly recommended improved diet / exercise to improve overall health and to help with fatty liver if it is present -- counseling included https://www.[place].org/ChooseMyPlate.gov as a meal planning resource, at least 30 minutes of moderate exercise at least 3 days weekly, etc. F/u as needed, otherwise. Pt voiced appreciation and understanding.  Bobbye Mortonhristopher M Giulia Hickey, MD PGY-3, [facility] 01/04/2015, 3:48 PM

## 2015-01-06 ENCOUNTER — Encounter: Payer: Self-pay | Admitting: Family Medicine

## 2015-01-06 LAB — TSH+FREE T4
Free T4: 1.08 g/dL (ref 0.52–1.21)
TSH: 1.9 u[IU]/mL (ref 0.34–5.66)

## 2015-01-21 ENCOUNTER — Other Ambulatory Visit (HOSPITAL_COMMUNITY): Payer: Self-pay | Admitting: Internal Medicine

## 2015-01-26 ENCOUNTER — Ambulatory Visit (INDEPENDENT_AMBULATORY_CARE_PROVIDER_SITE_OTHER): Payer: Medicaid Other | Admitting: *Deleted

## 2015-01-26 DIAGNOSIS — I059 Rheumatic mitral valve disease, unspecified: Secondary | ICD-10-CM

## 2015-01-26 DIAGNOSIS — Z5181 Encounter for therapeutic drug level monitoring: Secondary | ICD-10-CM | POA: Diagnosis not present

## 2015-01-26 DIAGNOSIS — Z952 Presence of prosthetic heart valve: Secondary | ICD-10-CM

## 2015-01-26 DIAGNOSIS — Z7901 Long term (current) use of anticoagulants: Secondary | ICD-10-CM | POA: Diagnosis not present

## 2015-01-26 DIAGNOSIS — Z954 Presence of other heart-valve replacement: Secondary | ICD-10-CM | POA: Diagnosis not present

## 2015-01-26 LAB — POCT INR: INR: 3.4

## 2015-01-28 ENCOUNTER — Other Ambulatory Visit: Payer: Self-pay | Admitting: Cardiology

## 2015-02-02 ENCOUNTER — Telehealth: Payer: Self-pay

## 2015-02-02 ENCOUNTER — Other Ambulatory Visit: Payer: Self-pay | Admitting: *Deleted

## 2015-02-02 ENCOUNTER — Other Ambulatory Visit: Payer: Self-pay | Admitting: Cardiology

## 2015-02-02 DIAGNOSIS — I5022 Chronic systolic (congestive) heart failure: Secondary | ICD-10-CM

## 2015-02-02 DIAGNOSIS — I482 Chronic atrial fibrillation, unspecified: Secondary | ICD-10-CM

## 2015-02-02 DIAGNOSIS — J189 Pneumonia, unspecified organism: Secondary | ICD-10-CM

## 2015-02-02 DIAGNOSIS — R0602 Shortness of breath: Secondary | ICD-10-CM

## 2015-02-02 DIAGNOSIS — I5032 Chronic diastolic (congestive) heart failure: Secondary | ICD-10-CM

## 2015-02-02 MED ORDER — POTASSIUM CHLORIDE CRYS ER 20 MEQ PO TBCR: 40.0000 meq | EXTENDED_RELEASE_TABLET | Freq: Two times a day (BID) | ORAL | Status: DC

## 2015-02-02 NOTE — Telephone Encounter (Signed)
As requested refills returned to pharmacy 

## 2015-02-10 ENCOUNTER — Telehealth: Payer: Self-pay | Admitting: Hematology

## 2015-02-10 ENCOUNTER — Encounter: Payer: Self-pay | Admitting: Hematology

## 2015-02-10 ENCOUNTER — Other Ambulatory Visit (HOSPITAL_BASED_OUTPATIENT_CLINIC_OR_DEPARTMENT_OTHER): Payer: Medicaid Other

## 2015-02-10 ENCOUNTER — Ambulatory Visit (HOSPITAL_BASED_OUTPATIENT_CLINIC_OR_DEPARTMENT_OTHER): Payer: Medicaid Other | Admitting: Hematology

## 2015-02-10 VITALS — BP 116/58 | HR 80 | Temp 98.0°F | Resp 18 | Ht 59.0 in | Wt 160.5 lb

## 2015-02-10 DIAGNOSIS — D582 Other hemoglobinopathies: Secondary | ICD-10-CM | POA: Insufficient documentation

## 2015-02-10 LAB — CBC & DIFF AND RETIC
BASO%: 1 % (ref 0.0–2.0)
Basophils Absolute: 0.1 10*3/uL (ref 0.0–0.1)
EOS ABS: 0.2 10*3/uL (ref 0.0–0.5)
EOS%: 3 % (ref 0.0–7.0)
HCT: 36.4 % (ref 34.8–46.6)
HGB: 11.7 g/dL (ref 11.6–15.9)
Immature Retic Fract: 26.3 % — ABNORMAL HIGH (ref 1.60–10.00)
LYMPH#: 1.8 10*3/uL (ref 0.9–3.3)
LYMPH%: 35.5 % (ref 14.0–49.7)
MCH: 23.9 pg — ABNORMAL LOW (ref 25.1–34.0)
MCHC: 32.1 g/dL (ref 31.5–36.0)
MCV: 74.4 fL — ABNORMAL LOW (ref 79.5–101.0)
MONO#: 0.5 10*3/uL (ref 0.1–0.9)
MONO%: 10.2 % (ref 0.0–14.0)
NEUT#: 2.5 10*3/uL (ref 1.5–6.5)
NEUT%: 50.3 % (ref 38.4–76.8)
PLATELETS: 153 10*3/uL (ref 145–400)
RBC: 4.89 10*6/uL (ref 3.70–5.45)
RDW: 15.3 % — AB (ref 11.2–14.5)
Retic %: 3.49 % — ABNORMAL HIGH (ref 0.70–2.10)
Retic Ct Abs: 170.66 10*3/uL — ABNORMAL HIGH (ref 33.70–90.70)
WBC: 5 10*3/uL (ref 3.9–10.3)

## 2015-02-10 LAB — COMPREHENSIVE METABOLIC PANEL (CC13)
ALT: 44 U/L (ref 0–55)
AST: 45 U/L — AB (ref 5–34)
Albumin: 4 g/dL (ref 3.5–5.0)
Alkaline Phosphatase: 74 U/L (ref 40–150)
Anion Gap: 7 mEq/L (ref 3–11)
BUN: 21.7 mg/dL (ref 7.0–26.0)
CHLORIDE: 105 meq/L (ref 98–109)
CO2: 30 mEq/L — ABNORMAL HIGH (ref 22–29)
Calcium: 9.3 mg/dL (ref 8.4–10.4)
Creatinine: 1.1 mg/dL (ref 0.6–1.1)
EGFR: 57 mL/min/{1.73_m2} — AB (ref >90)
GLUCOSE: 139 mg/dL (ref 70–140)
POTASSIUM: 3.9 meq/L (ref 3.5–5.1)
Sodium: 143 mEq/L (ref 136–145)
Total Bilirubin: 2.55 mg/dL — ABNORMAL HIGH (ref 0.20–1.20)
Total Protein: 7.2 g/dL (ref 6.4–8.3)

## 2015-02-10 LAB — BILIRUBIN, DIRECT: BILIRUBIN DIRECT: 0.5 mg/dL — AB (ref 0.0–0.3)

## 2015-02-10 NOTE — Telephone Encounter (Signed)
per pof to sch pt appt-pt insisited to come in 3 months for lab-gave avs

## 2015-02-10 NOTE — Progress Notes (Signed)
[facility] OFFICE PROGRESS NOTE  Maryjean Ka, MD [address] [place] [address]  DIAGNOSIS: Hemoglobin E disease  CHIEF COMPLAIN: follow up    INTERVAL HISTORY: Deborah [name] 64 y.o. 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   She was last seen by me 6 months ago.  She had episode of abdominal pain and hematuria we'll months ago, was seen and investigated by her primary care physician. She was also seen by urologist , workup was negative. She did not have recurrent hematuria or pain.  She is on Coumadin, she denies any other bleedings. She otherwise feels well overall.  MEDICAL HISTORY: Past Medical History  Diagnosis Date  . Rheumatic heart disease     Status post mechanical ([employer]. Jude) mitral valve replacement and tricuspid valve repair at [facility] in 2002; echo 5/12:   EF 60-65%, mild AI, mitral valve prosthesis with AVA 1.66, severe LAE, moderate RAE, moderate to severe TR, mild increased pulmonary artery systolic pressure;    Adenosine Cardiolite in 4/09:   No ischemia, EF 66%  . Chronic atrial fibrillation     coumadin managed by [facility]. CVRR;  Event montor (7/12-8/12) showed no high rate episodes (patient continuously in atrial fibrillation).   . Chronic diastolic heart failure   . Fatigue   . Borderline diabetes   . Hyperlipidemia   . Obesity   . Anemia   . Gout   . Hypertension   . Carpal tunnel syndrome   . External hemorrhoids   . Rotator cuff syndrome   . 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 Gaylyn Rong  . 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 Hx of cardiovascular stress test     ETT-myoview (7/12): No evidence for ischemia or infarction  . External hemorrhoid   . Allergic rhinitis   . Eosinophilia 01/27/2013    INTERIM HISTORY: has Hyperlipidemia associated with type 2 diabetes mellitus; GOUT; CARPAL TUNNEL SYNDROME, LEFT; TRICUSPID REGURGITATION,  MODERATE WITH MILD PULMONARY HTN; GERD; HYPERBILIRUBINEMIA; HYPERGLYCEMIA; Mitral valve disorder; Rheumatic heart disease; Chronic atrial fibrillation; Chronic anticoagulation; DM (diabetes mellitus); Hyperlipidemia; Diastolic CHF, chronic; Microcytic anemia; CAP (community acquired pneumonia); Hypotension; SIRS (systemic inflammatory response syndrome); Eosinophilia; Encounter for therapeutic drug monitoring; Disorders of both mitral and tricuspid valves; Post cardiotomy syndrome; H/O prosthetic heart valve; Postprocedural state; Chest pain; Right shoulder pain; Bacterial upper respiratory infection; Acute bronchitis; and Hemoglobin E disease on her problem list.    ALLERGIES:  is allergic to promethazine hcl and promethazine hcl.  MEDICATIONS: has a current medication list which includes the following prescription(s): acetaminophen, allopurinol, aspirin ec, benzonatate, accu-chek aviva, cetirizine, cyanocobalamin, diazepam, diclofenac sodium, folic acid, glucose blood, loteprednol, metoprolol succinate, omeprazole, potassium chloride sa, simvastatin, torsemide, tramadol, and warfarin.  SURGICAL HISTORY:  Past Surgical History  Procedure Laterality Date  . Tricuspid valve repair    . Cardiac catheterization      EF 55%    REVIEW OF SYSTEMS:   Constitutional: Denies fevers, chills or abnormal weight loss Eyes: Denies blurriness of vision Ears, nose, mouth, throat, and face: Denies mucositis or sore throat Respiratory: Denies cough, dyspnea or wheezes Cardiovascular: Denies palpitation, chest discomfort or lower extremity swelling Gastrointestinal:  Denies nausea, heartburn or change in bowel habits Skin: Denies abnormal skin rashes Lymphatics: Denies new lymphadenopathy or easy bruising Neurological:Denies numbness, tingling or new weaknesses Behavioral/Psych: Mood is stable, no new changes  All other  systems were reviewed with the patient and are negative.  PHYSICAL EXAMINATION: ECOG  PERFORMANCE STATUS: 1 - Symptomatic but completely ambulatory  Blood pressure 116/58, pulse 80, temperature 98 F (36.7 C), temperature source Oral, resp. rate 18, height  (1.499 m), weight 160 lb 8 oz (72.802 kg), SpO2 97 %.  GENERAL:alert, no distress and comfortable SKIN: skin color, texture, turgor are normal, no rashes or significant lesions EYES: normal, Conjunctiva are pink and non-injected, sclera clear OROPHARYNX:no exudate, no erythema and lips, buccal mucosa, and tongue normal  NECK: supple, thyroid normal size, non-tender, without nodularity LYMPH:  no palpable lymphadenopathy in the cervical, axillary or supraclavicular LUNGS: clear to auscultation with normal breathing effort, no wheezes or rhonchi HEART: regular rate & rhythm and no murmurs and no lower extremity edema ABDOMEN:abdomen soft, non-tender and normal bowel sounds Musculoskeletal:no cyanosis of digits and no clubbing  NEURO: alert & oriented x 3 with fluent speech, no focal motor/sensory deficits  Labs:   CBC Latest Ref Rng 02/10/2015 12/29/2014 11/12/2014  WBC 3.9 - 10.3 10e3/uL 5.0 6.2 6.1  Hemoglobin 11.6 - 15.9 g/dL 16.1 09.6 04.5  Hematocrit 34.8 - 46.6 % 36.4 38.1 38.6  Platelets 145 - 400 10e3/uL 153 179 176    CMP Latest Ref Rng 02/10/2015 12/29/2014 11/12/2014  Glucose 70 - 140 mg/dl 409 811(B) 147  BUN 7.0 - 26.0 mg/dL 82.9 18 56.2  Creatinine 0.6 - 1.1 mg/dL 1.1 1.30 0.9  Sodium 865 - 145 mEq/L 143 141 141  Potassium 3.5 - 5.1 mEq/L 3.9 3.9 3.8  Chloride 96 - 112 mEq/L - 103 -  CO2 22 - 29 mEq/L 30(H) 26 25  Calcium 8.4 - 10.4 mg/dL 9.3 9.2 9.3  Total Protein 6.4 - 8.3 g/dL 7.2 7.5 7.4  Total Bilirubin 0.20 - 1.20 mg/dL 7.84(O) 9.6(E) 9.52(W)  Alkaline Phos 40 - 150 U/L 74 67 88  AST 5 - 34 U/L 45(H) 27 25  ALT 0 - 55 U/L 44 31 25     RADIOGRAPHIC STUDIES: No results found.  ASSESSMENT: Deborah Nai Darin 64 y.o. female with a history of Hemoglobin E disease   PLAN:   1. Hemoglobin  E and Constant Spring:  - stable. 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 No transfusion is indicated. -I recommended B12 and folic acid supplement due to her mild hemolysis process, she is on it  -We discussed this is a genetic disorder with autosomal dominant pattern, her children should be screened also.  -monitor her CBC every 3-6 month.  2. Transient elevated eosinophil, resolved:  Most likely due to acute infection in April 2014. Since then, her eosinophilia has  normalized. No further work up is indicated.   3. Elevated bili - her  Bilirubin is 2.55 today,   Significant higher than her baseline, AST slightly elevated. I'll check the direct bilirubin,  If it's high also,  Could be related to her liver or gallbladder disease. She did have a abdominal ultrasound we'll months ago which showed fatty liver,  But otherwise negative.  plan  return to clinic in 6 months with repeated CBC   All questions were answered. The patient knows to call the clinic with any problems, questions or concerns. We can certainly see the patient much sooner if necessary.  I spent 15 minutes counseling the patient face to face. The total time spent in the appointment was 25 minutes.    Malachy Mood, MD 02/10/2015 11:54 AM

## 2015-02-10 NOTE — Telephone Encounter (Signed)
per pof top sch pt appt-gave pt avs

## 2015-02-14 ENCOUNTER — Other Ambulatory Visit (HOSPITAL_COMMUNITY): Payer: Self-pay | Admitting: Internal Medicine

## 2015-02-21 ENCOUNTER — Other Ambulatory Visit: Payer: Self-pay

## 2015-02-22 ENCOUNTER — Other Ambulatory Visit: Payer: Self-pay | Admitting: Family Medicine

## 2015-02-23 ENCOUNTER — Ambulatory Visit (INDEPENDENT_AMBULATORY_CARE_PROVIDER_SITE_OTHER): Payer: Medicaid Other | Admitting: *Deleted

## 2015-02-23 DIAGNOSIS — I059 Rheumatic mitral valve disease, unspecified: Secondary | ICD-10-CM | POA: Diagnosis not present

## 2015-02-23 DIAGNOSIS — Z5181 Encounter for therapeutic drug level monitoring: Secondary | ICD-10-CM

## 2015-02-23 DIAGNOSIS — Z954 Presence of other heart-valve replacement: Secondary | ICD-10-CM

## 2015-02-23 DIAGNOSIS — Z7901 Long term (current) use of anticoagulants: Secondary | ICD-10-CM | POA: Diagnosis not present

## 2015-02-23 DIAGNOSIS — Z952 Presence of prosthetic heart valve: Secondary | ICD-10-CM

## 2015-02-23 LAB — POCT INR: INR: 2.3

## 2015-03-01 ENCOUNTER — Other Ambulatory Visit (HOSPITAL_COMMUNITY): Payer: Self-pay | Admitting: Internal Medicine

## 2015-03-16 ENCOUNTER — Ambulatory Visit (INDEPENDENT_AMBULATORY_CARE_PROVIDER_SITE_OTHER): Payer: Medicaid Other | Admitting: *Deleted

## 2015-03-16 DIAGNOSIS — I059 Rheumatic mitral valve disease, unspecified: Secondary | ICD-10-CM | POA: Diagnosis not present

## 2015-03-16 DIAGNOSIS — Z7901 Long term (current) use of anticoagulants: Secondary | ICD-10-CM

## 2015-03-16 DIAGNOSIS — Z5181 Encounter for therapeutic drug level monitoring: Secondary | ICD-10-CM | POA: Diagnosis not present

## 2015-03-16 DIAGNOSIS — Z952 Presence of prosthetic heart valve: Secondary | ICD-10-CM

## 2015-03-16 DIAGNOSIS — Z954 Presence of other heart-valve replacement: Secondary | ICD-10-CM

## 2015-03-16 LAB — POCT INR: INR: 2.2

## 2015-03-24 ENCOUNTER — Ambulatory Visit (INDEPENDENT_AMBULATORY_CARE_PROVIDER_SITE_OTHER): Payer: Medicaid Other | Admitting: Family Medicine

## 2015-03-24 ENCOUNTER — Encounter: Payer: Self-pay | Admitting: Family Medicine

## 2015-03-24 VITALS — BP 122/76 | HR 84 | Temp 97.9°F | Ht 59.0 in | Wt 158.0 lb

## 2015-03-24 DIAGNOSIS — E118 Type 2 diabetes mellitus with unspecified complications: Secondary | ICD-10-CM | POA: Diagnosis not present

## 2015-03-24 DIAGNOSIS — Z23 Encounter for immunization: Secondary | ICD-10-CM | POA: Diagnosis not present

## 2015-03-24 DIAGNOSIS — R1011 Right upper quadrant pain: Secondary | ICD-10-CM

## 2015-03-24 DIAGNOSIS — Z0000 Encounter for general adult medical examination without abnormal findings: Secondary | ICD-10-CM | POA: Diagnosis not present

## 2015-03-24 DIAGNOSIS — M25511 Pain in right shoulder: Secondary | ICD-10-CM

## 2015-03-24 NOTE — Assessment & Plan Note (Signed)
Recent labs and abdominal ultrasound ruled out liver and gallbladder dysfunction Abdominal pain improving We'll continue to monitor, no indication for further imaging at this time

## 2015-03-24 NOTE — Progress Notes (Signed)
Subjective:   Deborah [name] is a 64 y.o. female with a history of diastolic CHF, chronic a fib, T2DM, HLD here for abd pain, shoulder pain.  Abdominal pain (RUQ) - seen in clinic 12/29/14, CMET, CBC wnl, abd [place] with Heterogeneous hepatic echotexture consistent with fatty infiltration and/or hepatocellular disease. - reports that it is better than it was  - trying to eat better, reducing portion size - has lost 2 lbs - no pain today, but did have it a couple days ago, didn't take any meds - occurs once every 2 weeks, lasts minutes - usually after eating a meal or laying down at night - better with putting oil on skin - usually RUQ but sometimes in other quadrants - reports that it worries her a lot  Neck/shoulder pain - reports pain on R side of neck and into R shoulder, sometimes down arm - had MRI a few years ago and had a tear in upper arm - thought she needed therapy, but do not take Medicaid - tried at home stretches 5-6 yrs ago - hurts too bad to carry purse sometimes - occasionally takes Tylenol for pain - it helps - no parasthesias/numbness/tingling  Back pain - localized L thoracic back - stabbing pain - occurred yesterday and today - lasts ~10-15 min - no change in activity or injury - today wrapped around flank to under L breast - occurred at rest   Review of Systems:  Per HPI. All other systems reviewed and are negative.   PMH, PSH, Medications, Allergies, and FmHx reviewed and updated in EMR.  Social History: non smoker  Objective:  BP 122/76 mmHg  Pulse 84  Temp(Src) 97.9 F (36.6 C) (Oral)  Ht  (1.499 m)  Wt 158 lb (71.668 kg)  BMI 31.89 kg/m2  Gen:  64 y.o. female in NAD HEENT: NCAT, MMM, EOMI, PERRL, anicteric sclerae CV: Irregularly irregular, no MRG, intact distal pulses Resp: Non-labored, CTAB, no wheezes noted Abd: Soft, ND, mildly TTP in RUQ, BS present, no guarding or organomegaly Ext: WWP, no edema MSK: R shoulder: diffusely  TTP, limited abduction and flexion, strength and sensation intact Neuro: Alert and oriented, speech normal      Chemistry      Component Value Date/Time   NA 143 02/10/2015 0928   NA 141 12/29/2014 0955   K 3.9 02/10/2015 0928   K 3.9 12/29/2014 0955   CL 103 12/29/2014 0955   CL 108* 02/12/2013 0813   CO2 30* 02/10/2015 0928   CO2 26 12/29/2014 0955   BUN 21.7 02/10/2015 0928   BUN 18 12/29/2014 0955   CREATININE 1.1 02/10/2015 0928   CREATININE 0.93 12/29/2014 0955   CREATININE 0.98 09/17/2014 1204      Component Value Date/Time   CALCIUM 9.3 02/10/2015 0928   CALCIUM 9.2 12/29/2014 0955   ALKPHOS 74 02/10/2015 0928   ALKPHOS 67 12/29/2014 0955   AST 45* 02/10/2015 0928   AST 27 12/29/2014 0955   ALT 44 02/10/2015 0928   ALT 31 12/29/2014 0955   BILITOT 2.55* 02/10/2015 0928   BILITOT 1.5* 12/29/2014 0955      Lab Results  Component Value Date   WBC 5.0 02/10/2015   HGB 11.7 02/10/2015   HCT 36.4 02/10/2015   MCV 74.4* 02/10/2015   PLT 153 02/10/2015   Lab Results  Component Value Date   TSH 1.90 01/05/2015   Lab Results  Component Value Date   HGBA1C 6.2 12/29/2014  Assessment:     Deborah [name] is a 64 y.o. female here for abd pain, R shoulder pain.    Plan:      Reassured patient that back pain is likely MSK muscle strain.  See problem list for problem-specific plans.   Erasmo Downer, MD PGY-2,  [facility] 03/24/2015  2:03 PM

## 2015-03-24 NOTE — Patient Instructions (Signed)
Nice to meet you today. We will continue to watch your abdominal pain as it is getting better. Please do the shoulder exercises 2-3 times each day to help loosen up your shoulder. If it is still hurting and you're unable to lift it all the way in one month, come back to see me for steroid injection. Continue taking Tylenol as needed for pain.  Take care,  Dr. Leonard Schwartz

## 2015-03-24 NOTE — Assessment & Plan Note (Signed)
Related to previous rotator cuff injury Concerns for beginning stages of adhesive capsulitis Offered patient given injection, but she prefers to wait Shoulder exercises given and encourage patient to do these 2-3 times daily Offered ibuprofen for anti-inflammatory properties, the patient reports palpitations last time she took ibuprofen Continue Tylenol for pain control Follow-up in one month and consider steroid-induced at that time if not improved

## 2015-03-28 ENCOUNTER — Other Ambulatory Visit: Payer: Self-pay | Admitting: *Deleted

## 2015-03-28 MED ORDER — OMEPRAZOLE 20 MG PO CPDR: DELAYED_RELEASE_CAPSULE | ORAL | Status: DC

## 2015-03-30 ENCOUNTER — Ambulatory Visit (INDEPENDENT_AMBULATORY_CARE_PROVIDER_SITE_OTHER): Payer: Medicaid Other | Admitting: *Deleted

## 2015-03-30 DIAGNOSIS — Z7901 Long term (current) use of anticoagulants: Secondary | ICD-10-CM | POA: Diagnosis not present

## 2015-03-30 DIAGNOSIS — Z5181 Encounter for therapeutic drug level monitoring: Secondary | ICD-10-CM

## 2015-03-30 DIAGNOSIS — Z954 Presence of other heart-valve replacement: Secondary | ICD-10-CM

## 2015-03-30 DIAGNOSIS — I059 Rheumatic mitral valve disease, unspecified: Secondary | ICD-10-CM

## 2015-03-30 DIAGNOSIS — Z952 Presence of prosthetic heart valve: Secondary | ICD-10-CM

## 2015-03-30 LAB — POCT INR: INR: 2.2

## 2015-04-04 ENCOUNTER — Telehealth (HOSPITAL_COMMUNITY): Payer: Self-pay | Admitting: Vascular Surgery

## 2015-04-04 NOTE — Telephone Encounter (Signed)
Pt needs refill Torsemide

## 2015-04-05 ENCOUNTER — Other Ambulatory Visit (HOSPITAL_COMMUNITY): Payer: Self-pay | Admitting: *Deleted

## 2015-04-05 DIAGNOSIS — I5032 Chronic diastolic (congestive) heart failure: Secondary | ICD-10-CM

## 2015-04-05 DIAGNOSIS — J189 Pneumonia, unspecified organism: Secondary | ICD-10-CM

## 2015-04-05 DIAGNOSIS — I482 Chronic atrial fibrillation, unspecified: Secondary | ICD-10-CM

## 2015-04-05 DIAGNOSIS — R0602 Shortness of breath: Secondary | ICD-10-CM

## 2015-04-05 MED ORDER — POTASSIUM CHLORIDE CRYS ER 20 MEQ PO TBCR: 40.0000 meq | EXTENDED_RELEASE_TABLET | Freq: Two times a day (BID) | ORAL | Status: DC

## 2015-04-05 MED ORDER — TORSEMIDE 20 MG PO TABS: ORAL_TABLET | ORAL | Status: DC

## 2015-04-05 MED ORDER — WARFARIN SODIUM 2 MG PO TABS: ORAL_TABLET | ORAL | Status: DC

## 2015-04-07 ENCOUNTER — Encounter: Payer: Self-pay | Admitting: Family Medicine

## 2015-04-07 ENCOUNTER — Ambulatory Visit (HOSPITAL_COMMUNITY)
Admission: RE | Admit: 2015-04-07 | Discharge: 2015-04-07 | Disposition: A | Discharge status: Home / Self Care | Payer: Medicaid Other | Source: Ambulatory Visit | Attending: Family Medicine | Admitting: Family Medicine

## 2015-04-07 ENCOUNTER — Ambulatory Visit (INDEPENDENT_AMBULATORY_CARE_PROVIDER_SITE_OTHER): Payer: Medicaid Other | Admitting: Family Medicine

## 2015-04-07 ENCOUNTER — Other Ambulatory Visit: Payer: Self-pay

## 2015-04-07 ENCOUNTER — Ambulatory Visit
Admission: RE | Admit: 2015-04-07 | Discharge: 2015-04-07 | Disposition: A | Discharge status: Home / Self Care | Payer: Medicaid Other | Source: Ambulatory Visit | Attending: Family Medicine | Admitting: Family Medicine

## 2015-04-07 VITALS — BP 110/68 | HR 78 | Temp 98.2°F | Ht 59.0 in | Wt 161.0 lb

## 2015-04-07 DIAGNOSIS — R0602 Shortness of breath: Secondary | ICD-10-CM | POA: Insufficient documentation

## 2015-04-07 DIAGNOSIS — Z0000 Encounter for general adult medical examination without abnormal findings: Secondary | ICD-10-CM | POA: Diagnosis not present

## 2015-04-07 DIAGNOSIS — R05 Cough: Secondary | ICD-10-CM | POA: Diagnosis not present

## 2015-04-07 DIAGNOSIS — E118 Type 2 diabetes mellitus with unspecified complications: Secondary | ICD-10-CM | POA: Diagnosis not present

## 2015-04-07 DIAGNOSIS — Z23 Encounter for immunization: Secondary | ICD-10-CM | POA: Diagnosis not present

## 2015-04-07 DIAGNOSIS — I4891 Unspecified atrial fibrillation: Secondary | ICD-10-CM | POA: Diagnosis not present

## 2015-04-07 DIAGNOSIS — R059 Cough, unspecified: Secondary | ICD-10-CM

## 2015-04-07 DIAGNOSIS — R9431 Abnormal electrocardiogram [ECG] [EKG]: Secondary | ICD-10-CM | POA: Insufficient documentation

## 2015-04-07 NOTE — Progress Notes (Signed)
Patient ID: Deborah [name], female   DOB: 05-Jan-1951, 64 y.o.   MRN: [id number]  HPI:  Pt presents for a same day appointment to discuss cough and fever.  Pt reports yesterday PM started to feel cold and tired. She had to wear extra clothes to stay warm. Coughed a few times. Has not taken temperature but had chills. Denies nausea, vomiting, diarrhea. Normal PO intake. Denies any pain. Cough is nonproductive. Has mild headache. Denies any rashes or tick bites and states she does not go outside. No nasal congestion. Felt really bad last night with the fever. Has had pneumonia in the past several times. No chest pain other than some L sided pain two days ago that lasted for 10 minutes. Endorses mild SOB.  ROS: See HPI  PMFSH: hx CAP, afib on coumadin, diastolic CHF, mitral & tricuspid valve repairs, DM, GERD, gout, HLD, anemia  PHYSICAL EXAM: BP 110/68 mmHg  Pulse 78  Temp(Src) 98.2 F (36.8 C) (Oral)  Ht  (1.499 m)  Wt 161 lb (73.029 kg)  BMI 32.50 kg/m2  O2 Gen: NAD, pleasant, cooperative HEENT: NCAT, full ROM of neck. MMM. Oropharynx clear. Nares patent. Heart: irregularly irregular, no murmur Lungs: CTAB, NWOB. No crackles or wheezes. Speaks in full sentences without distress. Abdomen: soft NTTP Neuro: grossly nonfocal, speech normal  ASSESSMENT/PLAN:  1. Subjective fevers and cough: no obvious finding on exam to suggest source. No hypoxia with ambulation today. Favor viral process. Given significant cardiac hx and report of mild SOB, EKG performed today which is unchanged from prior. As patient also has hx of PNA multiple times, will obtain CXR. If PNA present on CXR pt will be contacted to start antibiotics. Discussed return precautions at length. If not better Monday return for repeat exam. If any worsening over the weekend go to urgent care for evaluation.  FOLLOW UP: F/u as needed if symptoms worsen or do not improve.   [place] J. Pollie Meyer, MD [facility]

## 2015-04-07 NOTE — Patient Instructions (Addendum)
Your EKG looks unchanged Please go get chest xray. If it shows a pneumonia I will prescribe antibiotics.  If you are not feeling better tomorrow or this weekend please return to be seen again Go to ER if rashes, continued fevers, unable to eat/drink, continued shortness of breath, etc.  Come back next week to be seen again to be sure you're doing better.  Be well, Dr. Pollie Meyer

## 2015-04-08 ENCOUNTER — Telehealth: Payer: Self-pay | Admitting: Family Medicine

## 2015-04-08 DIAGNOSIS — J209 Acute bronchitis, unspecified: Secondary | ICD-10-CM

## 2015-04-08 MED ORDER — BENZONATATE 100 MG PO CAPS: 100.0000 mg | ORAL_CAPSULE | Freq: Three times a day (TID) | ORAL | Status: DC | PRN

## 2015-04-08 NOTE — Telephone Encounter (Signed)
Called pt. She requests tessalon perles. Sent in rx. Also informed that CXR had no pneumonia. Recommend f/u next week if not improving. Pt appreciative.  Latrelle Dodrill, MD

## 2015-04-08 NOTE — Telephone Encounter (Signed)
Pt seen yesterday, woke up today with a cough, would like cough medicine

## 2015-04-08 NOTE — Telephone Encounter (Signed)
Seen by Pollie Meyer yesterday, will forward to MD.

## 2015-04-13 ENCOUNTER — Ambulatory Visit (INDEPENDENT_AMBULATORY_CARE_PROVIDER_SITE_OTHER): Payer: Medicaid Other | Admitting: *Deleted

## 2015-04-13 DIAGNOSIS — I059 Rheumatic mitral valve disease, unspecified: Secondary | ICD-10-CM

## 2015-04-13 DIAGNOSIS — Z952 Presence of prosthetic heart valve: Secondary | ICD-10-CM

## 2015-04-13 DIAGNOSIS — Z5181 Encounter for therapeutic drug level monitoring: Secondary | ICD-10-CM

## 2015-04-13 DIAGNOSIS — Z7901 Long term (current) use of anticoagulants: Secondary | ICD-10-CM | POA: Diagnosis not present

## 2015-04-13 DIAGNOSIS — Z954 Presence of other heart-valve replacement: Secondary | ICD-10-CM

## 2015-04-13 LAB — POCT INR: INR: 3.7

## 2015-04-19 ENCOUNTER — Ambulatory Visit (HOSPITAL_COMMUNITY)
Admission: RE | Admit: 2015-04-19 | Discharge: 2015-04-19 | Disposition: A | Discharge status: Home / Self Care | Payer: Medicaid Other | Source: Ambulatory Visit | Attending: Cardiology | Admitting: Cardiology

## 2015-04-19 VITALS — BP 102/58 | HR 72 | Wt 160.0 lb

## 2015-04-19 DIAGNOSIS — Z7982 Long term (current) use of aspirin: Secondary | ICD-10-CM | POA: Diagnosis not present

## 2015-04-19 DIAGNOSIS — I482 Chronic atrial fibrillation, unspecified: Secondary | ICD-10-CM

## 2015-04-19 DIAGNOSIS — M109 Gout, unspecified: Secondary | ICD-10-CM | POA: Insufficient documentation

## 2015-04-19 DIAGNOSIS — Z79899 Other long term (current) drug therapy: Secondary | ICD-10-CM | POA: Insufficient documentation

## 2015-04-19 DIAGNOSIS — I10 Essential (primary) hypertension: Secondary | ICD-10-CM | POA: Diagnosis not present

## 2015-04-19 DIAGNOSIS — I059 Rheumatic mitral valve disease, unspecified: Secondary | ICD-10-CM

## 2015-04-19 DIAGNOSIS — E785 Hyperlipidemia, unspecified: Secondary | ICD-10-CM | POA: Diagnosis not present

## 2015-04-19 DIAGNOSIS — Z7901 Long term (current) use of anticoagulants: Secondary | ICD-10-CM | POA: Diagnosis not present

## 2015-04-19 DIAGNOSIS — I5032 Chronic diastolic (congestive) heart failure: Secondary | ICD-10-CM | POA: Insufficient documentation

## 2015-04-19 DIAGNOSIS — Z952 Presence of prosthetic heart valve: Secondary | ICD-10-CM | POA: Diagnosis not present

## 2015-04-19 MED ORDER — TORSEMIDE 20 MG PO TABS: ORAL_TABLET | ORAL | Status: DC

## 2015-04-19 NOTE — Progress Notes (Signed)
Patient ID: Deborah [name], female   DOB: [birth year], 64 y.o.   MRN: [id number] PCP: Dr. Beryle Flock  64 yo with rheumatic heart disease and MV replacement/TV repair, chronic atrial fibrillation, and chronic diastolic CHF.  She returns for follow up. She is noted more dyspnea with exertion, some days gets short of breath after walking for 10 minutes on treadmill.  No chest pain.  No BRBPR or melena.  Occasionally wakes short of breath at night.  Weight is up 3 lbs.  Labs (8/12): K 3.5, creatinine 0.98 Labs (10/12): K 3.8, creatinine 1.0, BNP 116 Labs (1/13): K 3.6, creatinine 0.9, LDL 85, HDL 62 Labs (9/13): K 3.9, creatinine 1.0 Labs (4/14): HCT 37.8 with elevated eosinophils, BNP 1094 => 82, K 3.6, creatinine 1.0 Labs (9/14): K 3.6, creatinine 0.89, hemoglobin 11 Labs (1/15): K 3.7, creatinine 0.9, HCT 36.9 Labs (2/15): LDL 99, HDL 50 Labs (3/15): K 4, creatinine 1.28 Labs (6/15): K 3.7, creatinine 1.0, BNP 124 => 80 Labs (02/22/14): K 3.5 Creatinine 0.9 Labs (10/15): K 3.3, creatinine 0.94, HCT 38.9 Labs (6/16): K 3.9, creatinine 1.1, HCT 36.4  PMH: 1. Rheumatic heart disease: Status post mechanical MV replacement and tricuspid repair at [facility] in 2002.  Echo 5/12 with EF 60-65%, mild AI, mechanical MV prosthesis with normal function, severe LAE, moderate RAE, moderate to severe TR, mildly elevated PA systolic pressure.  Echo (6/15) EF 55-60%, normal RV, normal mechanical mitral valve, severe LAE, moderate TR.  2. Chronic atrial fibrillation on coumadin.  Event monitor (7/12-8/12) showed no high rate episodes (patient continuously in atrial fibrillation).  3. Chronic diastolic CHF 4. ETT-myoview (7/12): No evidence for ischemia or infarction.  5. Impaired fasting glucose 6. Hyperlipidemia  7. HTN 8. Gout 9. Anemia 10. External hemorrhoids.  11. 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   SH: Nonsmoker. Lives in [place].    FH: No premature CAD  ROS: All systems reviewed and negative except as noted in HPI.    Current Outpatient Prescriptions  Medication Sig Dispense Refill  . acetaminophen (TYLENOL) 325 MG tablet Take 650 mg by mouth every 6 (six) hours as needed for pain.     Marland Kitchen allopurinol (ZYLOPRIM) 300 MG tablet TAKE 1 TABLET (300 MG TOTAL) BY MOUTH DAILY. 30 tablet 0  . aspirin EC 81 MG tablet Take 81 mg by mouth daily.    . benzonatate (TESSALON PERLES) 100 MG capsule Take 1 capsule (100 mg total) by mouth 3 (three) times daily as needed for cough. 20 capsule 0  . Blood Glucose Monitoring Suppl (ACCU-CHEK AVIVA) device Use as instructed 1 each 0  . cetirizine (ZYRTEC) 10 MG tablet Take 10 mg by mouth daily.    . cyanocobalamin 500 MCG tablet Take 1 tablet (500 mcg total) by mouth daily. 30 tablet 5  . diazepam (VALIUM) 5 MG tablet TAKE 1 TABLET BY MOUTH EVERY 8 HOURS AS NEEDED FOR ANXIETY    . diclofenac sodium (VOLTAREN) 1 % GEL Apply 2 g topically 4 (four) times daily. 100 g 3  . folic acid (FOLVITE) 1 MG tablet TAKE 1 TABLET (1 MG TOTAL) BY MOUTH DAILY. 30 tablet 6  . glucose blood test strip Use as instructed for blood sugar checks twice a day. 100 each 12  . loteprednol (LOTEMAX) 0.2 % SUSP 1 drop 4 (four) times daily.    . metoprolol succinate (TOPROL-XL) 50 MG 24 hr tablet Take 50 mg by mouth daily.  Can take extra 1/2 tab for tachycardia    . omeprazole (PRILOSEC) 20 MG capsule TAKE 1 CAPSULE (20 MG TOTAL) BY MOUTH DAILY. 30 capsule 5  . potassium chloride SA (K-DUR,KLOR-CON) 20 MEQ tablet Take 2 tablets (40 mEq total) by mouth 2 (two) times daily. 180 tablet 3  . simvastatin (ZOCOR) 10 MG tablet TAKE 1 TABLET (10 MG TOTAL) BY MOUTH EVERY OTHER DAY. 15 tablet 3  . torsemide (DEMADEX) 20 MG tablet Take 5 tabs every other day ALTERNATING with 4 tabs every other day 150 tablet 0  . traMADol (ULTRAM) 50 MG tablet Take 1 tablet (50 mg total) by mouth every 8 (eight) hours as needed. 50 tablet 1  .  warfarin (COUMADIN) 2 MG tablet TAKE 1 TABLET EVERYDAY EXCEPT 1/2 TABLET ON MONDAYS AND WEDNESDAYS OR AS DIRECTED BY COUMADIN CLINIC 35 tablet 3   No current facility-administered medications for this encounter.    BP 102/58 mmHg  Pulse 72  Wt 160 lb (72.576 kg)  SpO2 95% General: NAD Neck: JVP  8-9 cm with HJR, no thyromegaly or thyroid nodule.  Lungs: Clear to auscultation bilaterally with normal respiratory effort. CV: Nondisplaced PMI.  Heart irregular S1/S2, no S3/S4, mechanical S1, 2/6 HSM LLSB.  No peripheral edema.  No carotid bruit.  Normal pedal pulses.  Abdomen: Soft, nontender, no hepatosplenomegaly, no distention.  Neurologic: Alert and oriented x 3.  Psych: Normal affect. Extremities: No clubbing or cyanosis. R foot medial aspect ecchymotic   Assessment/Plan: 1. Atrial fibrillation: Chronic.  Continue warfarin.  Rate is controlled.    2. Mechanical mitral valve: Continue ASA/coumadin, INR goal 2.5-3.5.  Will need bridging with heparin/Lovenox if has to come off coumadin.   3. Diastolic CHF:  Mild volume overload on exam, NYHA class II-III symptoms.   - Increase torsemide to 100 mg daily x 3 days, then alternate 80 mg daily with 100 mg daily.   - BMET in 10 days - Repeat echo.    4. Hyperlipidemia: Continue simvastatin.  5. Chest Pain: No further chest pain.  She has not wanted to have stress test done.  Will hold off for now.     Followup in 6 wks  Marca Ancona 04/19/2015

## 2015-04-19 NOTE — Patient Instructions (Signed)
Increase Torsemide to 100 mg (5 tabs) daily for 3 Days ONLY, THEN Take Torsemide 100 mg every other day ALTERNATING with 80 mg every other day   Labs in about 10 days  Your physician recommends that you schedule a follow-up appointment in: 6 weeks

## 2015-04-26 ENCOUNTER — Other Ambulatory Visit: Payer: Self-pay | Admitting: Internal Medicine

## 2015-04-27 ENCOUNTER — Ambulatory Visit (INDEPENDENT_AMBULATORY_CARE_PROVIDER_SITE_OTHER): Payer: Medicaid Other | Admitting: *Deleted

## 2015-04-27 DIAGNOSIS — Z954 Presence of other heart-valve replacement: Secondary | ICD-10-CM

## 2015-04-27 DIAGNOSIS — Z952 Presence of prosthetic heart valve: Secondary | ICD-10-CM

## 2015-04-27 DIAGNOSIS — Z7901 Long term (current) use of anticoagulants: Secondary | ICD-10-CM

## 2015-04-27 DIAGNOSIS — I059 Rheumatic mitral valve disease, unspecified: Secondary | ICD-10-CM

## 2015-04-27 DIAGNOSIS — Z5181 Encounter for therapeutic drug level monitoring: Secondary | ICD-10-CM | POA: Diagnosis not present

## 2015-04-27 LAB — POCT INR: INR: 3.8

## 2015-04-29 ENCOUNTER — Ambulatory Visit (HOSPITAL_COMMUNITY)
Admission: RE | Admit: 2015-04-29 | Discharge: 2015-04-29 | Disposition: A | Discharge status: Home / Self Care | Payer: Medicaid Other | Source: Ambulatory Visit | Attending: Internal Medicine | Admitting: Internal Medicine

## 2015-04-29 DIAGNOSIS — I5022 Chronic systolic (congestive) heart failure: Secondary | ICD-10-CM | POA: Diagnosis not present

## 2015-04-29 DIAGNOSIS — I5032 Chronic diastolic (congestive) heart failure: Secondary | ICD-10-CM | POA: Diagnosis present

## 2015-04-29 LAB — BASIC METABOLIC PANEL
Anion gap: 6 (ref 5–15)
BUN: 18 mg/dL (ref 6–20)
CALCIUM: 8.9 mg/dL (ref 8.9–10.3)
CO2: 28 mmol/L (ref 22–32)
CREATININE: 0.95 mg/dL (ref 0.44–1.00)
Chloride: 105 mmol/L (ref 101–111)
GFR calc Af Amer: >60 mL/min (ref >60)
GLUCOSE: 144 mg/dL — AB (ref 65–99)
POTASSIUM: 3.8 mmol/L (ref 3.5–5.1)
Sodium: 139 mmol/L (ref 135–145)

## 2015-05-04 ENCOUNTER — Other Ambulatory Visit: Payer: Self-pay

## 2015-05-04 ENCOUNTER — Ambulatory Visit (HOSPITAL_COMMUNITY): Payer: Medicaid Other | Attending: Cardiology

## 2015-05-04 DIAGNOSIS — I5032 Chronic diastolic (congestive) heart failure: Secondary | ICD-10-CM

## 2015-05-04 DIAGNOSIS — I351 Nonrheumatic aortic (valve) insufficiency: Secondary | ICD-10-CM | POA: Diagnosis not present

## 2015-05-04 DIAGNOSIS — I509 Heart failure, unspecified: Secondary | ICD-10-CM | POA: Diagnosis present

## 2015-05-04 DIAGNOSIS — E785 Hyperlipidemia, unspecified: Secondary | ICD-10-CM | POA: Diagnosis not present

## 2015-05-04 DIAGNOSIS — I071 Rheumatic tricuspid insufficiency: Secondary | ICD-10-CM | POA: Insufficient documentation

## 2015-05-04 DIAGNOSIS — I10 Essential (primary) hypertension: Secondary | ICD-10-CM | POA: Diagnosis not present

## 2015-05-04 DIAGNOSIS — Z952 Presence of prosthetic heart valve: Secondary | ICD-10-CM | POA: Diagnosis not present

## 2015-05-04 DIAGNOSIS — I517 Cardiomegaly: Secondary | ICD-10-CM | POA: Insufficient documentation

## 2015-05-11 ENCOUNTER — Ambulatory Visit (INDEPENDENT_AMBULATORY_CARE_PROVIDER_SITE_OTHER): Payer: Medicaid Other | Admitting: *Deleted

## 2015-05-11 DIAGNOSIS — Z7901 Long term (current) use of anticoagulants: Secondary | ICD-10-CM | POA: Diagnosis not present

## 2015-05-11 DIAGNOSIS — Z5181 Encounter for therapeutic drug level monitoring: Secondary | ICD-10-CM

## 2015-05-11 DIAGNOSIS — Z952 Presence of prosthetic heart valve: Secondary | ICD-10-CM

## 2015-05-11 DIAGNOSIS — I059 Rheumatic mitral valve disease, unspecified: Secondary | ICD-10-CM

## 2015-05-11 DIAGNOSIS — Z954 Presence of other heart-valve replacement: Secondary | ICD-10-CM | POA: Diagnosis not present

## 2015-05-11 LAB — POCT INR: INR: 3

## 2015-05-13 ENCOUNTER — Other Ambulatory Visit (HOSPITAL_BASED_OUTPATIENT_CLINIC_OR_DEPARTMENT_OTHER): Payer: Medicaid Other

## 2015-05-13 DIAGNOSIS — D582 Other hemoglobinopathies: Secondary | ICD-10-CM

## 2015-05-13 LAB — CBC & DIFF AND RETIC
BASO%: 0.7 % (ref 0.0–2.0)
Basophils Absolute: 0 10*3/uL (ref 0.0–0.1)
EOS ABS: 0.2 10*3/uL (ref 0.0–0.5)
EOS%: 3.5 % (ref 0.0–7.0)
HCT: 38.5 % (ref 34.8–46.6)
HEMOGLOBIN: 12.1 g/dL (ref 11.6–15.9)
Immature Retic Fract: 18.1 % — ABNORMAL HIGH (ref 1.60–10.00)
LYMPH%: 38 % (ref 14.0–49.7)
MCH: 23 pg — AB (ref 25.1–34.0)
MCHC: 31.4 g/dL — ABNORMAL LOW (ref 31.5–36.0)
MCV: 73.2 fL — ABNORMAL LOW (ref 79.5–101.0)
MONO#: 0.5 10*3/uL (ref 0.1–0.9)
MONO%: 8.9 % (ref 0.0–14.0)
NEUT%: 48.9 % (ref 38.4–76.8)
NEUTROS ABS: 2.6 10*3/uL (ref 1.5–6.5)
Platelets: 161 10*3/uL (ref 145–400)
RBC: 5.26 10*6/uL (ref 3.70–5.45)
RDW: 15.7 % — AB (ref 11.2–14.5)
RETIC %: 3.2 % — AB (ref 0.70–2.10)
Retic Ct Abs: 168.32 10*3/uL — ABNORMAL HIGH (ref 33.70–90.70)
WBC: 5.4 10*3/uL (ref 3.9–10.3)
lymph#: 2 10*3/uL (ref 0.9–3.3)

## 2015-05-13 LAB — COMPREHENSIVE METABOLIC PANEL (CC13)
ALBUMIN: 4.2 g/dL (ref 3.5–5.0)
ALK PHOS: 95 U/L (ref 40–150)
ALT: 30 U/L (ref 0–55)
AST: 30 U/L (ref 5–34)
Anion Gap: 9 mEq/L (ref 3–11)
BUN: 17.6 mg/dL (ref 7.0–26.0)
CHLORIDE: 105 meq/L (ref 98–109)
CO2: 27 meq/L (ref 22–29)
Calcium: 9.3 mg/dL (ref 8.4–10.4)
Creatinine: 1 mg/dL (ref 0.6–1.1)
EGFR: 62 mL/min/{1.73_m2} — ABNORMAL LOW (ref >90)
GLUCOSE: 135 mg/dL (ref 70–140)
POTASSIUM: 3.9 meq/L (ref 3.5–5.1)
SODIUM: 141 meq/L (ref 136–145)
Total Bilirubin: 1.61 mg/dL — ABNORMAL HIGH (ref 0.20–1.20)
Total Protein: 7.5 g/dL (ref 6.4–8.3)

## 2015-05-16 ENCOUNTER — Other Ambulatory Visit: Payer: Self-pay | Admitting: Cardiology

## 2015-05-16 ENCOUNTER — Other Ambulatory Visit: Payer: Self-pay | Admitting: Internal Medicine

## 2015-05-23 ENCOUNTER — Encounter (HOSPITAL_COMMUNITY): Payer: Self-pay | Admitting: *Deleted

## 2015-06-01 ENCOUNTER — Ambulatory Visit (INDEPENDENT_AMBULATORY_CARE_PROVIDER_SITE_OTHER): Payer: Medicaid Other | Admitting: *Deleted

## 2015-06-01 DIAGNOSIS — Z5181 Encounter for therapeutic drug level monitoring: Secondary | ICD-10-CM | POA: Diagnosis not present

## 2015-06-01 DIAGNOSIS — Z952 Presence of prosthetic heart valve: Secondary | ICD-10-CM

## 2015-06-01 DIAGNOSIS — I059 Rheumatic mitral valve disease, unspecified: Secondary | ICD-10-CM | POA: Diagnosis not present

## 2015-06-01 DIAGNOSIS — Z7901 Long term (current) use of anticoagulants: Secondary | ICD-10-CM | POA: Diagnosis not present

## 2015-06-01 DIAGNOSIS — Z954 Presence of other heart-valve replacement: Secondary | ICD-10-CM | POA: Diagnosis not present

## 2015-06-01 LAB — POCT INR: INR: 3.1

## 2015-06-29 ENCOUNTER — Ambulatory Visit (INDEPENDENT_AMBULATORY_CARE_PROVIDER_SITE_OTHER): Payer: Medicaid Other | Admitting: *Deleted

## 2015-06-29 DIAGNOSIS — Z7901 Long term (current) use of anticoagulants: Secondary | ICD-10-CM

## 2015-06-29 DIAGNOSIS — I059 Rheumatic mitral valve disease, unspecified: Secondary | ICD-10-CM

## 2015-06-29 DIAGNOSIS — Z952 Presence of prosthetic heart valve: Secondary | ICD-10-CM

## 2015-06-29 DIAGNOSIS — Z954 Presence of other heart-valve replacement: Secondary | ICD-10-CM

## 2015-06-29 DIAGNOSIS — Z5181 Encounter for therapeutic drug level monitoring: Secondary | ICD-10-CM

## 2015-06-29 LAB — POCT INR: INR: 2.8

## 2015-07-18 ENCOUNTER — Encounter (HOSPITAL_COMMUNITY): Payer: Self-pay

## 2015-07-18 ENCOUNTER — Ambulatory Visit (HOSPITAL_COMMUNITY)
Admission: RE | Admit: 2015-07-18 | Discharge: 2015-07-18 | Disposition: A | Discharge status: Home / Self Care | Payer: Medicaid Other | Source: Ambulatory Visit | Attending: Cardiology | Admitting: Cardiology

## 2015-07-18 VITALS — BP 110/62 | HR 86 | Wt 161.8 lb

## 2015-07-18 DIAGNOSIS — I5032 Chronic diastolic (congestive) heart failure: Secondary | ICD-10-CM | POA: Diagnosis not present

## 2015-07-18 DIAGNOSIS — J189 Pneumonia, unspecified organism: Secondary | ICD-10-CM

## 2015-07-18 DIAGNOSIS — Z7982 Long term (current) use of aspirin: Secondary | ICD-10-CM | POA: Diagnosis not present

## 2015-07-18 DIAGNOSIS — M109 Gout, unspecified: Secondary | ICD-10-CM | POA: Insufficient documentation

## 2015-07-18 DIAGNOSIS — Z952 Presence of prosthetic heart valve: Secondary | ICD-10-CM | POA: Diagnosis not present

## 2015-07-18 DIAGNOSIS — R0602 Shortness of breath: Secondary | ICD-10-CM | POA: Diagnosis not present

## 2015-07-18 DIAGNOSIS — I482 Chronic atrial fibrillation, unspecified: Secondary | ICD-10-CM

## 2015-07-18 DIAGNOSIS — Z79899 Other long term (current) drug therapy: Secondary | ICD-10-CM | POA: Diagnosis not present

## 2015-07-18 DIAGNOSIS — Z7902 Long term (current) use of antithrombotics/antiplatelets: Secondary | ICD-10-CM | POA: Insufficient documentation

## 2015-07-18 DIAGNOSIS — I099 Rheumatic heart disease, unspecified: Secondary | ICD-10-CM | POA: Insufficient documentation

## 2015-07-18 DIAGNOSIS — E785 Hyperlipidemia, unspecified: Secondary | ICD-10-CM | POA: Diagnosis not present

## 2015-07-18 DIAGNOSIS — D649 Anemia, unspecified: Secondary | ICD-10-CM | POA: Diagnosis not present

## 2015-07-18 DIAGNOSIS — Z7901 Long term (current) use of anticoagulants: Secondary | ICD-10-CM | POA: Diagnosis not present

## 2015-07-18 DIAGNOSIS — I129 Hypertensive chronic kidney disease with stage 1 through stage 4 chronic kidney disease, or unspecified chronic kidney disease: Secondary | ICD-10-CM | POA: Insufficient documentation

## 2015-07-18 DIAGNOSIS — I059 Rheumatic mitral valve disease, unspecified: Secondary | ICD-10-CM

## 2015-07-18 LAB — BASIC METABOLIC PANEL
ANION GAP: 10 (ref 5–15)
BUN: 20 mg/dL (ref 6–20)
CO2: 25 mmol/L (ref 22–32)
Calcium: 9.4 mg/dL (ref 8.9–10.3)
Chloride: 105 mmol/L (ref 101–111)
Creatinine, Ser: 0.95 mg/dL (ref 0.44–1.00)
GFR calc Af Amer: >60 mL/min (ref >60)
GLUCOSE: 149 mg/dL — AB (ref 65–99)
POTASSIUM: 3.6 mmol/L (ref 3.5–5.1)
SODIUM: 140 mmol/L (ref 135–145)

## 2015-07-18 LAB — BRAIN NATRIURETIC PEPTIDE: B NATRIURETIC PEPTIDE 5: 115.4 pg/mL — AB (ref 0.0–100.0)

## 2015-07-18 MED ORDER — TORSEMIDE 20 MG PO TABS: ORAL_TABLET | ORAL | Status: DC

## 2015-07-18 MED ORDER — TORSEMIDE 20 MG PO TABS: 80.0000 mg | ORAL_TABLET | Freq: Every day | ORAL | Status: DC

## 2015-07-18 MED ORDER — POTASSIUM CHLORIDE CRYS ER 20 MEQ PO TBCR: 60.0000 meq | EXTENDED_RELEASE_TABLET | Freq: Two times a day (BID) | ORAL | Status: DC

## 2015-07-18 NOTE — Progress Notes (Signed)
Patient ID: Deborah [name], female   DOB: 11/19/1950, 64 y.o.   MRN: [id number] PCP: Dr. Beryle FlockBacigalupo  64 yo with rheumatic heart disease and MV replacement/TV repair, chronic atrial fibrillation, and chronic diastolic CHF.  She returns for follow up. She is noted more dyspnea with exertion, some days gets short of breath after walking for 10 minutes on treadmill.  She has to stop several times trying to climb a flight of steps.  No edema.  No chest pain.  No BRBPR or melena.  Chronically sleeps on 2 pillows.  Weight up 1 lb.   Labs (8/12): K 3.5, creatinine 0.98 Labs (10/12): K 3.8, creatinine 1.0, BNP 116 Labs (1/13): K 3.6, creatinine 0.9, LDL 85, HDL 62 Labs (9/13): K 3.9, creatinine 1.0 Labs (4/14): HCT 37.8 with elevated eosinophils, BNP 1094 => 82, K 3.6, creatinine 1.0 Labs (9/14): K 3.6, creatinine 0.89, hemoglobin 11 Labs (1/15): K 3.7, creatinine 0.9, HCT 36.9 Labs (2/15): LDL 99, HDL 50 Labs (3/15): K 4, creatinine 1.28 Labs (6/15): K 3.7, creatinine 1.0, BNP 124 => 80 Labs (02/22/14): K 3.5 Creatinine 0.9 Labs (10/15): K 3.3, creatinine 0.94, HCT 38.9 Labs (6/16): K 3.9, creatinine 1.1, HCT 36.4 Labs (9/16): K 3.9, creatinine 1.61, HCT 38.5  PMH: 1. Rheumatic heart disease: Status post mechanical MV replacement and tr[facility]pid [facility] at [facility] in 2002.  Echo 5/12 with EF 60-65%, mild AI, mechanical MV prosthesis with normal function, severe LAE, moderate RAE, moderate to severe TR, mildly elevated PA systolic pressure.  Echo (6/15) EF 55-60%, normal RV, normal mechanical mitral valve, severe LAE, moderate TR.  Echo (9/16) with EF 55-60%, mild to moderate AI, mechanical mitral valve looks ok, s/p TV repair with mild TR, PASP 40.  2. Chronic atrial fibrillation on coumadin.  Event monitor (7/12-8/12) showed no high rate episodes (patient continuously in atrial fibrillation).  3. Chronic diastolic CHF 4. ETT-myoview (7/12): No evidence for ischemia or infarction.  5. Impaired fasting  glucose 6. Hyperlipidemia  7. HTN 8. Gout 9. Anemia 10. External hemorrhoids.  11. 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  13. CKD  SH: Nonsmoker. Lives in [place].   FH: No premature CAD  ROS: All systems reviewed and negative except as noted in HPI.    Current Outpatient Prescriptions  Medication Sig Dispense Refill  . acetaminophen (TYLENOL) 325 MG tablet Take 650 mg by mouth every 6 (six) hours as needed for pain.     Marland Kitchen. allopurinol (ZYLOPRIM) 300 MG tablet TAKE 1 TABLET (300 MG TOTAL) BY MOUTH DAILY. 30 tablet 0  . aspirin EC 81 MG tablet Take 81 mg by mouth daily.    . benzonatate (TESSALON PERLES) 100 MG capsule Take 1 capsule (100 mg total) by mouth 3 (three) times daily as needed for cough. 20 capsule 0  . cetirizine (ZYRTEC) 10 MG tablet Take 10 mg by mouth daily.    . cyanocobalamin 500 MCG tablet Take 1 tablet (500 mcg total) by mouth daily. 30 tablet 5  . diazepam (VALIUM) 5 MG tablet TAKE 1 TABLET BY MOUTH EVERY 8 HOURS AS NEEDED FOR ANXIETY    . diclofenac sodium (VOLTAREN) 1 % GEL Apply 2 g topically 4 (four) times daily. 100 g 3  . folic acid (FOLVITE) 1 MG tablet TAKE 1 TABLET (1 MG TOTAL) BY MOUTH DAILY. 30 tablet 6  . glucose blood test strip Use as instructed for blood sugar checks twice a day. 100  each 12  . loteprednol (LOTEMAX) 0.2 % SUSP 1 drop 4 (four) times daily.    . metoprolol succinate (TOPROL-XL) 50 MG 24 hr tablet Take 50 mg by mouth daily. Take with or immediately following a meal.    . omeprazole (PRILOSEC) 20 MG capsule TAKE 1 CAPSULE (20 MG TOTAL) BY MOUTH DAILY. 30 capsule 5  . potassium chloride SA (K-DUR,KLOR-CON) 20 MEQ tablet Take 3 tablets (60 mEq total) by mouth 2 (two) times daily. 240 tablet 3  . simvastatin (ZOCOR) 10 MG tablet TAKE 1 TABLET BY MOUTH EVERY OTHER DAY 15 tablet 0  . torsemide (DEMADEX) 20 MG tablet Take 80 mg in AM (4 tablets) and 40 mg (2 tablets in the  evening) 180 tablet 6  . traMADol (ULTRAM) 50 MG tablet Take by mouth every 6 (six) hours as needed for moderate pain.    Marland Kitchen warfarin (COUMADIN) 2 MG tablet TAKE 1 TABLET EVERYDAY EXCEPT 1/2 TABLET ON MONDAYS AND WEDNESDAYS OR AS DIRECTED BY COUMADIN CLINIC 35 tablet 3   No current facility-administered medications for this encounter.    BP 110/62 mmHg  Pulse 86  Wt 161 lb 12 oz (73.369 kg)  SpO2 98% General: NAD Neck: JVP  8-9 cm with HJR, no thyromegaly or thyroid nodule.  Lungs: Clear to auscultation bilaterally with normal respiratory effort. CV: Nondisplaced PMI.  Heart irregular S1/S2, no S3/S4, mechanical S1, 2/6 HSM LLSB.  No peripheral edema.  No carotid bruit.  Normal pedal pulses.  Abdomen: Soft, nontender, no hepatosplenomegaly, no distention.  Neurologic: Alert and oriented x 3.  Psych: Normal affect. Extremities: No clubbing or cyanosis. R foot medial aspect ecchymotic   Assessment/Plan: 1. Atrial fibrillation: Chronic.  Continue warfarin.  Rate is controlled.    2. Mechanical mitral valve: Continue ASA/coumadin, INR goal 2.5-3.5.  Will need bridging with heparin/Lovenox if has to come off coumadin.   3. Diastolic CHF:  Volume overload on exam, NYHA class II-III symptoms.  Stable echo in 9/16.  - Increase torsemide to 80 qam/40 qpm and KCl to 60 bid.  - BMET/BNP today and again in 10 days    4. Hyperlipidemia: Continue simvastatin.    Followup in 4 wks  Marca Ancona 07/18/2015

## 2015-07-18 NOTE — Progress Notes (Signed)
Advanced Heart Failure Medication Review by a Pharmacist  Does the patient  feel that his/her medications are working for him/her?  yes  Has the patient been experiencing any side effects to the medications prescribed?  no  Does the patient measure his/her own blood pressure or blood glucose at home?  yes   Does the patient have any problems obtaining medications due to transportation or finances?   no  Understanding of regimen: good Understanding of indications: good Potential of compliance: good Patient understands to avoid NSAIDs. Patient understands to avoid decongestants.  Issues to address at subsequent visits: None   Pharmacist comments:  [name] is a pleasant 64 yo F presenting with her daughter but without a medication list. She seems to have a good understanding of her regimen including dosages and reports excellent compliance. She did not have any specific medication-related questions or concerns for me at this time.   Deborah [name], PharmD, BCPS, CPP Clinical Pharmacist Pager: [phone] Phone: [phone] 07/18/2015 9:39 AM      Time with patient: 6 minutes Preparation and documentation time: 2 minutes Total time: 8 minutes

## 2015-07-18 NOTE — Patient Instructions (Addendum)
Lab work today BMET and BNP (done) BMET and BNP in 10 days Potasium 60 in am and 60 in pm Torsemide 80 mg (4 tablets) in am and 40 mg (2 tablets) in pm Come to office in 10 days , 07/28/15 at 10am for repeat labs Follow up in one month

## 2015-07-28 ENCOUNTER — Ambulatory Visit (HOSPITAL_COMMUNITY)
Admission: RE | Admit: 2015-07-28 | Discharge: 2015-07-28 | Disposition: A | Discharge status: Home / Self Care | Payer: Medicaid Other | Source: Ambulatory Visit | Attending: Internal Medicine | Admitting: Internal Medicine

## 2015-07-28 DIAGNOSIS — I5032 Chronic diastolic (congestive) heart failure: Secondary | ICD-10-CM | POA: Insufficient documentation

## 2015-07-28 LAB — BASIC METABOLIC PANEL
Anion gap: 8 (ref 5–15)
BUN: 26 mg/dL — ABNORMAL HIGH (ref 6–20)
CALCIUM: 9.3 mg/dL (ref 8.9–10.3)
CHLORIDE: 106 mmol/L (ref 101–111)
CO2: 26 mmol/L (ref 22–32)
CREATININE: 1.03 mg/dL — AB (ref 0.44–1.00)
GFR, EST NON AFRICAN AMERICAN: 57 mL/min — AB (ref >60)
Glucose, Bld: 149 mg/dL — ABNORMAL HIGH (ref 65–99)
Potassium: 3.4 mmol/L — ABNORMAL LOW (ref 3.5–5.1)
SODIUM: 140 mmol/L (ref 135–145)

## 2015-07-28 LAB — BRAIN NATRIURETIC PEPTIDE: B NATRIURETIC PEPTIDE 5: 95 pg/mL (ref 0.0–100.0)

## 2015-08-01 ENCOUNTER — Other Ambulatory Visit: Payer: Self-pay | Admitting: Family Medicine

## 2015-08-03 ENCOUNTER — Ambulatory Visit (INDEPENDENT_AMBULATORY_CARE_PROVIDER_SITE_OTHER): Payer: Medicaid Other | Admitting: *Deleted

## 2015-08-03 ENCOUNTER — Encounter (HOSPITAL_COMMUNITY): Payer: Self-pay | Admitting: *Deleted

## 2015-08-03 DIAGNOSIS — I059 Rheumatic mitral valve disease, unspecified: Secondary | ICD-10-CM | POA: Diagnosis not present

## 2015-08-03 DIAGNOSIS — Z952 Presence of prosthetic heart valve: Secondary | ICD-10-CM

## 2015-08-03 DIAGNOSIS — Z954 Presence of other heart-valve replacement: Secondary | ICD-10-CM

## 2015-08-03 DIAGNOSIS — Z7901 Long term (current) use of anticoagulants: Secondary | ICD-10-CM

## 2015-08-03 DIAGNOSIS — Z5181 Encounter for therapeutic drug level monitoring: Secondary | ICD-10-CM

## 2015-08-03 LAB — POCT INR: INR: 3.3

## 2015-08-04 ENCOUNTER — Other Ambulatory Visit: Payer: Self-pay | Admitting: Family Medicine

## 2015-08-09 ENCOUNTER — Other Ambulatory Visit: Payer: Self-pay | Admitting: Family Medicine

## 2015-08-09 MED ORDER — GLUCOSE BLOOD VI STRP: ORAL_STRIP | Status: DC

## 2015-08-09 NOTE — Telephone Encounter (Signed)
Needs refill on test strips.  [facility] [address]

## 2015-08-10 ENCOUNTER — Other Ambulatory Visit: Payer: Self-pay | Admitting: Family Medicine

## 2015-08-15 ENCOUNTER — Encounter: Payer: Self-pay | Admitting: Hematology

## 2015-08-15 ENCOUNTER — Other Ambulatory Visit (HOSPITAL_BASED_OUTPATIENT_CLINIC_OR_DEPARTMENT_OTHER): Payer: Medicaid Other

## 2015-08-15 ENCOUNTER — Telehealth: Payer: Self-pay | Admitting: Hematology

## 2015-08-15 ENCOUNTER — Ambulatory Visit (HOSPITAL_BASED_OUTPATIENT_CLINIC_OR_DEPARTMENT_OTHER): Payer: Medicaid Other | Admitting: Hematology

## 2015-08-15 VITALS — BP 115/72 | HR 64 | Temp 97.9°F | Resp 18 | Ht 59.0 in | Wt 164.2 lb

## 2015-08-15 DIAGNOSIS — D582 Other hemoglobinopathies: Secondary | ICD-10-CM

## 2015-08-15 LAB — CBC & DIFF AND RETIC
BASO%: 0.8 % (ref 0.0–2.0)
Basophils Absolute: 0.1 10*3/uL (ref 0.0–0.1)
EOS%: 3.1 % (ref 0.0–7.0)
Eosinophils Absolute: 0.2 10*3/uL (ref 0.0–0.5)
HCT: 39.4 % (ref 34.8–46.6)
HGB: 12.6 g/dL (ref 11.6–15.9)
Immature Retic Fract: 13.4 % — ABNORMAL HIGH (ref 1.60–10.00)
LYMPH%: 41.8 % (ref 14.0–49.7)
MCH: 23.9 pg — AB (ref 25.1–34.0)
MCHC: 32 g/dL (ref 31.5–36.0)
MCV: 74.8 fL — AB (ref 79.5–101.0)
MONO#: 0.7 10*3/uL (ref 0.1–0.9)
MONO%: 10.5 % (ref 0.0–14.0)
NEUT%: 43.8 % (ref 38.4–76.8)
NEUTROS ABS: 2.9 10*3/uL (ref 1.5–6.5)
PLATELETS: 147 10*3/uL (ref 145–400)
RBC: 5.27 10*6/uL (ref 3.70–5.45)
RDW: 15.5 % — ABNORMAL HIGH (ref 11.2–14.5)
Retic %: 2.75 % — ABNORMAL HIGH (ref 0.70–2.10)
Retic Ct Abs: 144.93 10*3/uL — ABNORMAL HIGH (ref 33.70–90.70)
WBC: 6.5 10*3/uL (ref 3.9–10.3)
lymph#: 2.7 10*3/uL (ref 0.9–3.3)

## 2015-08-15 LAB — COMPREHENSIVE METABOLIC PANEL
ALT: 23 U/L (ref 0–55)
ANION GAP: 9 meq/L (ref 3–11)
AST: 25 U/L (ref 5–34)
Albumin: 4.1 g/dL (ref 3.5–5.0)
Alkaline Phosphatase: 82 U/L (ref 40–150)
BUN: 20.7 mg/dL (ref 7.0–26.0)
CALCIUM: 9.3 mg/dL (ref 8.4–10.4)
CHLORIDE: 104 meq/L (ref 98–109)
CO2: 27 meq/L (ref 22–29)
CREATININE: 1.1 mg/dL (ref 0.6–1.1)
EGFR: 53 mL/min/{1.73_m2} — AB (ref >90)
Glucose: 138 mg/dl (ref 70–140)
Potassium: 4.2 mEq/L (ref 3.5–5.1)
Sodium: 141 mEq/L (ref 136–145)
Total Bilirubin: 1.59 mg/dL — ABNORMAL HIGH (ref 0.20–1.20)
Total Protein: 7.5 g/dL (ref 6.4–8.3)

## 2015-08-15 LAB — BILIRUBIN, DIRECT: BILIRUBIN DIRECT: 0.2 mg/dL (ref <=0.2)

## 2015-08-15 NOTE — Progress Notes (Signed)
[facility] OFFICE PROGRESS NOTE  Shirlee LatchAngela Bacigalupo, MD [address] [place] [id number]  DIAGNOSIS: Hemoglobin E disease (HCC) - Plan: Bilirubin, direct  CHIEF COMPLAIN: follow up    INTERVAL HISTORY: Deborah [name] 64 y.o. 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   She was last seen by me 6 months ago.  She had an episode of chest pain, and underwent cardiac workup including a stress test which were negative. She otherwise feeling well, no other medical events since her last visit. She exercise on treadmill everyday, remains physically active, lives with her husband, son and daughter.  MEDICAL HISTORY: Past Medical History  Diagnosis Date  . Rheumatic heart disease     Status post mechanical ([employer]) mitral valve replacement and tricuspid valve repair at [facility] in 2002; echo 5/12:   EF 60-65%, mild AI, mitral valve prosthesis with AVA 1.66, severe LAE, moderate RAE, moderate to severe TR, mild increased pulmonary artery systolic pressure;    Adenosine Cardiolite in 4/09:   No ischemia, EF 66%  . Chronic atrial fibrillation (HCC)     [facility];  Event montor (7/12-8/12) showed no high rate episodes (patient continuously in atrial fibrillation).   . Chronic diastolic heart failure (HCC)   . Fatigue   . Borderline diabetes   . Hyperlipidemia   . Obesity   . Anemia   . Gout   . Hypertension   . Carpal tunnel syndrome   . External hemorrhoids   . Rotator cuff syndrome   . 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 Gaylyn RongHa  . 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 Hx of cardiovascular stress test     ETT-myoview (7/12): No evidence for ischemia or infarction  . External hemorrhoid   . Allergic rhinitis   . 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    ALLERGIES:  is allergic to promethazine hcl.  MEDICATIONS: has a current medication list which includes the following prescription(s): acetaminophen, allopurinol, aspirin ec, benzonatate, cetirizine, cyanocobalamin, diazepam, diclofenac sodium, folic acid, glucose blood, loteprednol, metoprolol succinate, omeprazole, potassium chloride sa, simvastatin, torsemide, tramadol, and warfarin.  SURGICAL HISTORY:  Past Surgical History  Procedure Laterality Date  . Tricuspid valve repair    . Cardiac catheterization      EF 55%    REVIEW OF SYSTEMS:   Constitutional: Denies fevers, chills or abnormal weight loss Eyes: Denies blurriness of vision Ears, nose, mouth, throat, and face: Denies mucositis or sore throat Respiratory: Denies cough, dyspnea or wheezes Cardiovascular: Denies palpitation, chest discomfort or lower extremity swelling Gastrointestinal:  Denies nausea, heartburn or change in bowel habits Skin: Denies abnormal skin rashes Lymphatics: Denies new lymphadenopathy or easy bruising Neurological:Denies numbness, tingling or new weaknesses Behavioral/Psych: Mood is stable, no new changes  All  other systems were reviewed with the patient and are negative.  PHYSICAL EXAMINATION: ECOG  PERFORMANCE STATUS: 1 - Symptomatic but completely ambulatory  Blood pressure 115/72, pulse 64, temperature 97.9 F (36.6 C), temperature source Oral, resp. rate 18, height  (1.499 m), weight 164 lb 3.2 oz (74.481 kg), SpO2 95 %.  GENERAL:alert, no distress and comfortable SKIN: skin color, texture, turgor are normal, no rashes or significant lesions EYES: normal, Conjunctiva are pink and non-injected, sclera clear OROPHARYNX:no exudate, no erythema and lips, buccal mucosa, and tongue normal  NECK: supple, thyroid normal size, non-tender, without nodularity LYMPH:  no palpable lymphadenopathy in the cervical, axillary or supraclavicular LUNGS: clear to auscultation with normal breathing effort, no wheezes or rhonchi HEART: regular rate & rhythm and no murmurs and no lower extremity edema ABDOMEN:abdomen soft, non-tender and normal bowel sounds Musculoskeletal:no cyanosis of digits and no clubbing  NEURO: alert & oriented x 3 with fluent speech, no focal motor/sensory deficits  Labs:   CBC Latest Ref Rng 08/15/2015 05/13/2015 02/10/2015  WBC 3.9 - 10.3 10e3/uL 6.5 5.4 5.0  Hemoglobin 11.6 - 15.9 g/dL 16.1 09.6 04.5  Hematocrit 34.8 - 46.6 % 39.4 38.5 36.4  Platelets 145 - 400 10e3/uL 147 161 153    CMP Latest Ref Rng 08/15/2015 07/28/2015 07/18/2015  Glucose 70 - 140 mg/dl 409 811(B) 147(W)  BUN 7.0 - 26.0 mg/dL 29.5 62(Z) 20  Creatinine 0.6 - 1.1 mg/dL 1.1 3.08(M) 5.78  Sodium 136 - 145 mEq/L 141 140 140  Potassium 3.5 - 5.1 mEq/L 4.2 3.4(L) 3.6  Chloride 101 - 111 mmol/L - 106 105  CO2 22 - 29 mEq/L Calcium 8.4 - 10.4 mg/dL 9.3 9.3 9.4  Total Protein 6.4 - 8.3 g/dL 7.5 - -  Total Bilirubin 0.20 - 1.20 mg/dL 4.69(G) - -  Alkaline Phos 40 - 150 U/L 82 - -  AST 5 - 34 U/L 25 - -  ALT 0 - 55 U/L 23 - -     RADIOGRAPHIC STUDIES: No results found.  ASSESSMENT: Deborah [name] 64 y.o. female with a history of Hemoglobin E disease (HCC) - Plan: Bilirubin,  direct   PLAN:   1. Hemoglobin E and Constant Spring:  - stable. 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 No transfusion is indicated. -I recommend her to continue B12 and folic acid supplement due to her mild hemolysis process. -We discussed this is a genetic disorder with autosomal dominant pattern, her children should be screened also.  -monitor her CBC every 6 month.  2. Transient elevated eosinophil, resolved:  Most likely due to acute infection in April 2014. Since then, her eosinophilia has  normalized. No further work up is indicated.   3. Elevated bili - her  Bilirubin is 1.59 today, direct bilirubin level is still pending, the rest of the liver function panel were normal -Ultram often abdomen in May 2016 showed Fatty liver.  -I encouraged her to continue exercise, and try to lose some weight. She is on cholesterol pill.  4. Cancer screening -she is doing annual screening mammogram -She has not had screening colonoscopy, no family history of colon cancer. I recommend her to consider, she is still reluctant to do this the fact that she is on Coumadin. She will discuss with her primary care physician.  Plan:   return to clinic in one year with repeated CBC   All questions were answered. The patient knows to call the clinic with any  problems, questions or concerns. We can certainly see the patient much sooner if necessary.  I spent 15 minutes counseling the patient face to face. The total time spent in the appointment was 25 minutes.    Malachy Mood, MD 08/15/2015 11:00 AM

## 2015-08-15 NOTE — Telephone Encounter (Signed)
per pof to sch pt appt-gave pt copy of avs °

## 2015-08-17 ENCOUNTER — Other Ambulatory Visit: Payer: Self-pay | Admitting: Family Medicine

## 2015-08-17 DIAGNOSIS — J209 Acute bronchitis, unspecified: Secondary | ICD-10-CM

## 2015-08-17 MED ORDER — GLUCOSE BLOOD VI STRP: ORAL_STRIP | Status: DC

## 2015-08-17 MED ORDER — BENZONATATE 100 MG PO CAPS: 100.0000 mg | ORAL_CAPSULE | Freq: Three times a day (TID) | ORAL | Status: DC | PRN

## 2015-08-17 NOTE — Telephone Encounter (Signed)
Needs refill on benzonate, teststrips for Accu check [facility] [address]

## 2015-08-19 MED ORDER — GLUCOSE BLOOD VI STRP: ORAL_STRIP | Status: DC

## 2015-08-19 MED ORDER — BENZONATATE 100 MG PO CAPS: 100.0000 mg | ORAL_CAPSULE | Freq: Two times a day (BID) | ORAL | Status: DC | PRN

## 2015-08-19 MED ORDER — DICLOFENAC SODIUM 1 % TD GEL: 2.0000 g | Freq: Four times a day (QID) | TRANSDERMAL | Status: DC

## 2015-08-19 NOTE — Telephone Encounter (Signed)
Refills sent again  Erasmo DownerAngela M Larysa Pall, MD, MPH PGY-2,  [facility] 08/19/2015 10:25 AM

## 2015-08-19 NOTE — Telephone Encounter (Signed)
Pt called because the medication refills we sent in on 12/21 Glucose test strips and Tessalon were never received at the pharmacy. She is also needing Voltaren gel for her neck. jw

## 2015-08-24 ENCOUNTER — Other Ambulatory Visit: Payer: Self-pay | Admitting: Family Medicine

## 2015-08-24 MED ORDER — DICLOFENAC SODIUM 1 % TD GEL: 2.0000 g | Freq: Four times a day (QID) | TRANSDERMAL | Status: DC

## 2015-08-24 MED ORDER — GLUCOSE BLOOD VI STRP: ORAL_STRIP | Status: DC

## 2015-08-24 NOTE — Telephone Encounter (Signed)
Pt needs a refill on glucose blood test strip and diclofenac sodium (VOLTAREN) 1 % GEL [facility] [address] Thank you, Dorothey BasemanSadie Reynolds, ASA

## 2015-08-26 ENCOUNTER — Ambulatory Visit (HOSPITAL_COMMUNITY)
Admission: RE | Admit: 2015-08-26 | Discharge: 2015-08-26 | Disposition: A | Discharge status: Home / Self Care | Payer: Medicaid Other | Source: Ambulatory Visit | Attending: Cardiology | Admitting: Cardiology

## 2015-08-26 VITALS — BP 112/80 | HR 69 | Wt 162.0 lb

## 2015-08-26 DIAGNOSIS — I482 Chronic atrial fibrillation, unspecified: Secondary | ICD-10-CM

## 2015-08-26 DIAGNOSIS — Z954 Presence of other heart-valve replacement: Secondary | ICD-10-CM | POA: Insufficient documentation

## 2015-08-26 DIAGNOSIS — I059 Rheumatic mitral valve disease, unspecified: Secondary | ICD-10-CM

## 2015-08-26 DIAGNOSIS — E785 Hyperlipidemia, unspecified: Secondary | ICD-10-CM | POA: Diagnosis not present

## 2015-08-26 DIAGNOSIS — I5032 Chronic diastolic (congestive) heart failure: Secondary | ICD-10-CM | POA: Insufficient documentation

## 2015-08-26 MED ORDER — TORSEMIDE 20 MG PO TABS: ORAL_TABLET | ORAL | Status: DC

## 2015-08-26 NOTE — Progress Notes (Signed)
Advanced Heart Failure Medication Review by a Pharmacist  Does the patient  feel that his/her medications are working for him/her?  yes  Has the patient been experiencing any side effects to the medications prescribed?  no  Does the patient measure his/her own blood pressure or blood glucose at home?  yes   Does the patient have any problems obtaining medications due to transportation or finances?   no  Understanding of regimen: good Understanding of indications: good Potential of compliance: good Patient understands to avoid NSAIDs. Patient understands to avoid decongestants.  Issues to address at subsequent visits: None   Pharmacist comments:  [name] is a pleasant 64 yo F presenting with her daughter and without a medication list. She reports excellent compliance with her medications and did not have any specific medication-related questions or concerns for me at this time.   [name], PharmD, BCPS, CPP Clinical Pharmacist Pager: [phone] Phone: [phone] 08/26/2015 12:38 PM      Time with patient: 6 minutes Preparation and documentation time: 2 minutes Total time: 8 minutes

## 2015-08-26 NOTE — Patient Instructions (Signed)
Increase Torsemide 80 mg (4 tablets) in the am and 60 mg (3 tablets) in the afternoon  Labs in 2 weeks (BMP/BNP)  Follow up in 6 weeks

## 2015-08-28 NOTE — Progress Notes (Signed)
Patient ID: Deborah [name], female   DOB: 01/02/1951, 65 y.o.   MRN: [id number] PCP: Dr. Beryle FlockBacigalupo Cardiology: Dr. Shirlee LatchMcLean  65 yo with rheumatic heart disease and MV replacement/TV repair, chronic atrial fibrillation, and chronic diastolic CHF.  At last appointment, torsemide was increased due to volume overload.  She is now breathing better.  She can walk for 30 minutes on her treadmill without dyspnea.  No lightheadedness. No edema.  No chest pain.  No BRBPR or melena.  Chronically sleeps on 2 pillows.    Labs (8/12): K 3.5, creatinine 0.98 Labs (10/12): K 3.8, creatinine 1.0, BNP 116 Labs (1/13): K 3.6, creatinine 0.9, LDL 85, HDL 62 Labs (9/13): K 3.9, creatinine 1.0 Labs (4/14): HCT 37.8 with elevated eosinophils, BNP 1094 => 82, K 3.6, creatinine 1.0 Labs (9/14): K 3.6, creatinine 0.89, hemoglobin 11 Labs (1/15): K 3.7, creatinine 0.9, HCT 36.9 Labs (2/15): LDL 99, HDL 50 Labs (3/15): K 4, creatinine 1.28 Labs (6/15): K 3.7, creatinine 1.0, BNP 124 => 80 Labs (02/22/14): K 3.5 Creatinine 0.9 Labs (10/15): K 3.3, creatinine 0.94, HCT 38.9 Labs (6/16): K 3.9, creatinine 1.1, HCT 36.4 Labs (9/16): K 3.9, creatinine 1.61, HCT 38.5 Labs (12/16): HCT 39.4, K 4.2, creatinine 1.1  PMH: 1. Rheumatic heart disease: Status post mechanical MV replacement [facility] at [facility].  Echo 5/12 with EF 60-65%, mild AI, mechanical MV prosthesis with normal function, severe LAE, moderate RAE, moderate to severe TR, mildly elevated PA systolic pressure.  Echo (6/15) EF 55-60%, normal RV, normal mechanical mitral valve, severe LAE, moderate TR.  Echo (9/16) with EF 55-60%, mild to moderate AI, mechanical mitral valve looks ok, s/p TV repair with mild TR, PASP 40.  2. Chronic atrial fibrillation on coumadin.  Event monitor (7/12-8/12) showed no high rate episodes (patient continuously in atrial fibrillation).  3. Chronic diastolic CHF 4. ETT-myoview (7/12): No evidence for ischemia or  infarction.  5. Impaired fasting glucose 6. Hyperlipidemia  7. HTN 8. Gout 9. Anemia 10. External hemorrhoids.  11. 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  13. CKD  SH: Nonsmoker. Lives in [place].   FH: No premature CAD  ROS: All systems reviewed and negative except as noted in HPI.    Current Outpatient Prescriptions  Medication Sig Dispense Refill  . acetaminophen (TYLENOL) 325 MG tablet Take 650 mg by mouth every 6 (six) hours as needed for pain.     Marland Kitchen. allopurinol (ZYLOPRIM) 300 MG tablet TAKE 1 TABLET (300 MG TOTAL) BY MOUTH DAILY. 30 tablet 0  . aspirin EC 81 MG tablet Take 81 mg by mouth daily.    . benzonatate (TESSALON) 100 MG capsule Take 1 capsule (100 mg total) by mouth 2 (two) times daily as needed for cough. 20 capsule 0  . cetirizine (ZYRTEC) 10 MG tablet Take 10 mg by mouth daily.    . cyanocobalamin 500 MCG tablet Take 1 tablet (500 mcg total) by mouth daily. 30 tablet 5  . diazepam (VALIUM) 5 MG tablet TAKE 1 TABLET BY MOUTH EVERY 8 HOURS AS NEEDED FOR ANXIETY    . diclofenac sodium (VOLTAREN) 1 % GEL Apply 2 g topically 4 (four) times daily. 100 g 3  . folic acid (FOLVITE) 1 MG tablet TAKE 1 TABLET (1 MG TOTAL) BY MOUTH DAILY. 30 tablet 6  . glucose blood test strip Use as instructed to test blood glucose BID 100 each 12  . loteprednol (  LOTEMAX) 0.2 % SUSP 1 drop 4 (four) times daily.    . metoprolol succinate (TOPROL-XL) 50 MG 24 hr tablet Take 50 mg by mouth daily. Take with or immediately following a meal.    . omeprazole (PRILOSEC) 20 MG capsule TAKE 1 CAPSULE (20 MG TOTAL) BY MOUTH DAILY. 30 capsule 5  . potassium chloride SA (K-DUR,KLOR-CON) 20 MEQ tablet Take 3 tablets (60 mEq total) by mouth 2 (two) times daily. 240 tablet 3  . simvastatin (ZOCOR) 10 MG tablet TAKE 1 TABLET BY MOUTH EVERY OTHER DAY 15 tablet 0  . torsemide (DEMADEX) 20 MG tablet Take 80 mg in AM (4 tablets) and 60 mg (3  tablets in the evening) 210 tablet 6  . traMADol (ULTRAM) 50 MG tablet Take by mouth every 6 (six) hours as needed for moderate pain.    Marland Kitchen warfarin (COUMADIN) 2 MG tablet TAKE 1 TABLET EVERYDAY EXCEPT 1/2 TABLET ON MONDAYS AND WEDNESDAYS OR AS DIRECTED BY COUMADIN CLINIC 35 tablet 3   No current facility-administered medications for this encounter.    BP 112/80 mmHg  Pulse 69  Wt 162 lb (73.483 kg)  SpO2 98% General: NAD Neck: JVP  8-9 cm with HJR, no thyromegaly or thyroid nodule.  Lungs: Clear to auscultation bilaterally with normal respiratory effort. CV: Nondisplaced PMI.  Heart irregular S1/S2, no S3/S4, mechanical S1, 2/6 HSM LLSB.  No peripheral edema.  No carotid bruit.  Normal pedal pulses.  Abdomen: Soft, nontender, no hepatosplenomegaly, no distention.  Neurologic: Alert and oriented x 3.  Psych: Normal affect. Extremities: No clubbing or cyanosis. R foot medial aspect ecchymotic   Assessment/Plan: 1. Atrial fibrillation: Chronic.  Continue warfarin.  Rate is controlled.    2. Mechanical mitral valve: Continue ASA/coumadin, INR goal 2.5-3.5.  Will need bridging with heparin/Lovenox if has to come off coumadin.   3. Diastolic CHF:  Still some volume overload on exam.  NYHA class II symptoms.  Stable echo in 9/16.  - Increase torsemide to 80 qam/60 qpm.  - BMET/BNP in 10 days    4. Hyperlipidemia: Continue simvastatin.    Followup in 6 wks  Marca Ancona 08/28/2015

## 2015-08-30 ENCOUNTER — Encounter: Payer: Self-pay | Admitting: Family Medicine

## 2015-08-30 ENCOUNTER — Ambulatory Visit (INDEPENDENT_AMBULATORY_CARE_PROVIDER_SITE_OTHER): Payer: Medicaid Other | Admitting: Family Medicine

## 2015-08-30 VITALS — BP 105/80 | HR 75 | Temp 97.9°F | Wt 158.0 lb

## 2015-08-30 DIAGNOSIS — E785 Hyperlipidemia, unspecified: Secondary | ICD-10-CM

## 2015-08-30 DIAGNOSIS — Z23 Encounter for immunization: Secondary | ICD-10-CM

## 2015-08-30 DIAGNOSIS — J069 Acute upper respiratory infection, unspecified: Secondary | ICD-10-CM

## 2015-08-30 DIAGNOSIS — E118 Type 2 diabetes mellitus with unspecified complications: Secondary | ICD-10-CM | POA: Diagnosis not present

## 2015-08-30 DIAGNOSIS — Z0000 Encounter for general adult medical examination without abnormal findings: Secondary | ICD-10-CM | POA: Diagnosis present

## 2015-08-30 DIAGNOSIS — E1169 Type 2 diabetes mellitus with other specified complication: Secondary | ICD-10-CM

## 2015-08-30 LAB — POCT GLYCOSYLATED HEMOGLOBIN (HGB A1C): Hemoglobin A1C: 6.3

## 2015-08-30 MED ORDER — GLUCOSE BLOOD VI STRP: ORAL_STRIP | Status: DC

## 2015-08-30 NOTE — Patient Instructions (Signed)
Upper Respiratory Infection, Adult Most upper respiratory infections (URIs) are a viral infection of the air passages leading to the lungs. A URI affects the nose, throat, and upper air passages. The most common type of URI is nasopharyngitis and is typically referred to as "the common cold." URIs run their course and usually go away on their own. Most of the time, a URI does not require medical attention, but sometimes a bacterial infection in the upper airways can follow a viral infection. This is called a secondary infection. Sinus and middle ear infections are common types of secondary upper respiratory infections. Bacterial pneumonia can also complicate a URI. A URI can worsen asthma and chronic obstructive pulmonary disease (COPD). Sometimes, these complications can require emergency medical care and may be life threatening.  CAUSES Almost all URIs are caused by viruses. A virus is a type of germ and can spread from one person to another.  RISKS FACTORS You may be at risk for a URI if:   You smoke.   You have chronic heart or lung disease.  You have a weakened defense (immune) system.   You are very young or very old.   You have nasal allergies or asthma.  You work in crowded or poorly ventilated areas.  You work in health care facilities or schools. SIGNS AND SYMPTOMS  Symptoms typically develop 2-3 days after you come in contact with a cold virus. Most viral URIs last 7-10 days. However, viral URIs from the influenza virus (flu virus) can last 14-18 days and are typically more severe. Symptoms may include:   Runny or stuffy (congested) nose.   Sneezing.   Cough.   Sore throat.   Headache.   Fatigue.   Fever.   Loss of appetite.   Pain in your forehead, behind your eyes, and over your cheekbones (sinus pain).  Muscle aches.  DIAGNOSIS  Your health care provider may diagnose a URI by:  Physical exam.  Tests to check that your symptoms are not due to  another condition such as:  Strep throat.  Sinusitis.  Pneumonia.  Asthma. TREATMENT  A URI goes away on its own with time. It cannot be cured with medicines, but medicines may be prescribed or recommended to relieve symptoms. Medicines may help:  Reduce your fever.  Reduce your cough.  Relieve nasal congestion. HOME CARE INSTRUCTIONS   Take medicines only as directed by your health care provider.   Gargle warm saltwater or take cough drops to comfort your throat as directed by your health care provider.  Use a warm mist humidifier or inhale steam from a shower to increase air moisture. This may make it easier to breathe.  Drink enough fluid to keep your urine clear or pale yellow.   Eat soups and other clear broths and maintain good nutrition.   Rest as needed.   Return to work when your temperature has returned to normal or as your health care provider advises. You may need to stay home longer to avoid infecting others. You can also use a face mask and careful hand washing to prevent spread of the virus.  Increase the usage of your inhaler if you have asthma.   Do not use any tobacco products, including cigarettes, chewing tobacco, or electronic cigarettes. If you need help quitting, ask your health care provider. PREVENTION  The best way to protect yourself from getting a cold is to practice good hygiene.   Avoid oral or hand contact with people with cold   symptoms.   Wash your hands often if contact occurs.  There is no clear evidence that vitamin C, vitamin E, echinacea, or exercise reduces the chance of developing a cold. However, it is always recommended to get plenty of rest, exercise, and practice good nutrition.  SEEK MEDICAL CARE IF:   You are getting worse rather than better.   Your symptoms are not controlled by medicine.   You have chills.  You have worsening shortness of breath.  You have brown or red mucus.  You have yellow or brown nasal  discharge.  You have pain in your face, especially when you bend forward.  You have a fever.  You have swollen neck glands.  You have pain while swallowing.  You have white areas in the back of your throat. SEEK IMMEDIATE MEDICAL CARE IF:   You have severe or persistent:  Headache.  Ear pain.  Sinus pain.  Chest pain.  You have chronic lung disease and any of the following:  Wheezing.  Prolonged cough.  Coughing up blood.  A change in your usual mucus.  You have a stiff neck.  You have changes in your:  Vision.  Hearing.  Thinking.  Mood. MAKE SURE YOU:   Understand these instructions.  Will watch your condition.  Will get help right away if you are not doing well or get worse.   This information is not intended to replace advice given to you by your health care provider. Make sure you discuss any questions you have with your health care provider.   Document Released: 02/06/2001 Document Revised: 12/28/2014 Document Reviewed: 11/18/2013 Elsevier Interactive Patient Education 2016 [employer] Inc.  

## 2015-08-30 NOTE — Progress Notes (Signed)
   Subjective:   Deborah [name] is a 65 y.o. female with a history of T2DM, HLD, a fib, HFpEF, mitral and tricuspid valve disorders here for cough, T2DM and HLD f/u.  Cough, sore throat - 2-3 days - taking tessalon perles which help cough - no rhinorrhea, congestion, no fevers - Neck ache - taking tylenol - not new - grand daughter was sick with similar symptoms  Wants flu shot today  T2DM - Checking BG at home: fasting, consistently in 160s-170s - Medications: none, previously diet controlled - Compliance: n/a - Diet: tries to avoid carbs and sugar, but did indulge over the holidays - Exercise: walking 30min 4 days per week - Needs test strip for glucometer - eye exam within last year - foot exam today  HLD - medications: simvastatin 10mg  3-4 days a week - compliance: good - medication SEs: none, but muscle aches on higher dose   Review of Systems:  Per HPI. All other systems reviewed and are negative.   PMH, PSH, Medications, Allergies, and FmHx reviewed and updated in EMR.  Social History: never smoker  Objective:  BP 105/80 mmHg  Pulse 75  Temp(Src) 97.9 F (36.6 C) (Oral)  Wt 158 lb (71.668 kg)  Gen:  65 y.o. female in NAD HEENT: NCAT, MMM, EOMI, PERRL, anicteric sclerae, OP clear, TMs clear CV: Irregularly irregular, systolic murmur, intact distal pulses Resp: Non-labored, CTAB, no wheezes noted Abd: Soft, NTND, BS present, no guarding or organomegaly Ext: WWP, no edema MSK: no obvious deformities Neuro: Alert and oriented, speech normal  Diabetic Foot Exam - Simple   Simple Foot Form  Diabetic Foot exam was performed with the following findings:  Yes 08/30/2015  2:22 PM  Visual Inspection  No deformities, no ulcerations, no other skin breakdown bilaterally:  Yes  Sensation Testing  Intact to touch and monofilament testing bilaterally:  Yes  Pulse Check  Posterior Tibialis and Dorsalis pulse intact bilaterally:  Yes  Comments           Assessment & Plan:     Deborah [name] is a 65 y.o. female here for viral URI, T2DM, HLD.  Viral URI Symptoms consistent with viral process Afebrile, clear lung exam, and lack of OP exudate go against pneumonia or strep pharyngitis Discussed natural course and symptomatic treatment Return precautions discussed  DM (diabetes mellitus) A1c remains well controlled at 6.3 Advised patient to continue with diet control No indication for medications at this time  Hyperlipidemia associated with type 2 diabetes mellitus Lipid panel today shows elevated cholesterol Patient has been unable to tolerate higher doses of statin in the past however Continue simvastatin 10mg  qod Advised patient to discuss with her cardiologist as well       Erasmo DownerAngela M Bacigalupo, MD MPH PGY-2,  [facility] 09/01/2015  2:26 PM

## 2015-08-31 ENCOUNTER — Encounter: Payer: Self-pay | Admitting: Family Medicine

## 2015-08-31 LAB — COMPLETE METABOLIC PANEL WITH GFR
ALBUMIN: 4.7 g/dL (ref 3.6–5.1)
ALK PHOS: 70 U/L (ref 33–130)
ALT: 21 U/L (ref 6–29)
AST: 26 U/L (ref 10–35)
BILIRUBIN TOTAL: 1.9 mg/dL — AB (ref 0.2–1.2)
BUN: 22 mg/dL (ref 7–25)
CO2: 28 mmol/L (ref 20–31)
Calcium: 9.4 mg/dL (ref 8.6–10.4)
Chloride: 104 mmol/L (ref 98–110)
Creat: 0.94 mg/dL (ref 0.50–0.99)
GFR, EST AFRICAN AMERICAN: 75 mL/min (ref >=60)
GFR, EST NON AFRICAN AMERICAN: 65 mL/min (ref >=60)
GLUCOSE: 119 mg/dL — AB (ref 65–99)
POTASSIUM: 4 mmol/L (ref 3.5–5.3)
SODIUM: 141 mmol/L (ref 135–146)
TOTAL PROTEIN: 7.3 g/dL (ref 6.1–8.1)

## 2015-08-31 LAB — LIPID PANEL
Cholesterol: 214 mg/dL — ABNORMAL HIGH (ref 125–200)
HDL: 70 mg/dL (ref >=46)
LDL Cholesterol: 120 mg/dL (ref <130)
Total CHOL/HDL Ratio: 3.1 Ratio (ref <=5.0)
Triglycerides: 122 mg/dL (ref <150)
VLDL: 24 mg/dL (ref <30)

## 2015-09-01 NOTE — Assessment & Plan Note (Signed)
Lipid panel today shows elevated cholesterol Patient has been unable to tolerate higher doses of statin in the past however Continue simvastatin 10mg  qod Advised patient to discuss with her cardiologist as well

## 2015-09-01 NOTE — Assessment & Plan Note (Signed)
Symptoms consistent with viral process Afebrile, clear lung exam, and lack of OP exudate go against pneumonia or strep pharyngitis Discussed natural course and symptomatic treatment Return precautions discussed

## 2015-09-01 NOTE — Assessment & Plan Note (Signed)
A1c remains well controlled at 6.3 Advised patient to continue with diet control No indication for medications at this time

## 2015-09-05 ENCOUNTER — Telehealth (HOSPITAL_COMMUNITY): Payer: Self-pay

## 2015-09-05 NOTE — Telephone Encounter (Signed)
Patient's daughter called CHF triage on behalf of patient, stating patient had been experiencing chest pain, dizziness, SOB, weakness yesterday and this morning, but has since tapered off in the last few hours.  Daughter thought this may be due to recent increase in torsemide at last OV, hoever, this was 2 weeks ago, and advised that this is not a typical side effect.  Patient has extensive cardiac history and advised daughter go to ED if this reoccurs to be ruled out for MI/STEMI/occlusions.  Daughter voiced understanding, aware and agreeable to plan.  Ave FilterBradley, Kallen Mccrystal Genevea

## 2015-09-12 ENCOUNTER — Ambulatory Visit (HOSPITAL_COMMUNITY)
Admission: RE | Admit: 2015-09-12 | Discharge: 2015-09-12 | Disposition: A | Discharge status: Home / Self Care | Payer: Medicaid Other | Source: Ambulatory Visit | Attending: Internal Medicine | Admitting: Internal Medicine

## 2015-09-12 DIAGNOSIS — I5032 Chronic diastolic (congestive) heart failure: Secondary | ICD-10-CM | POA: Diagnosis not present

## 2015-09-12 LAB — BASIC METABOLIC PANEL
Anion gap: 9 (ref 5–15)
BUN: 19 mg/dL (ref 6–20)
CHLORIDE: 106 mmol/L (ref 101–111)
CO2: 28 mmol/L (ref 22–32)
Calcium: 9.4 mg/dL (ref 8.9–10.3)
Creatinine, Ser: 1.01 mg/dL — ABNORMAL HIGH (ref 0.44–1.00)
GFR calc Af Amer: >60 mL/min (ref >60)
GFR calc non Af Amer: 58 mL/min — ABNORMAL LOW (ref >60)
GLUCOSE: 157 mg/dL — AB (ref 65–99)
POTASSIUM: 4.2 mmol/L (ref 3.5–5.1)
Sodium: 143 mmol/L (ref 135–145)

## 2015-09-12 LAB — BRAIN NATRIURETIC PEPTIDE: B Natriuretic Peptide: 108.9 pg/mL — ABNORMAL HIGH (ref 0.0–100.0)

## 2015-09-14 ENCOUNTER — Ambulatory Visit (INDEPENDENT_AMBULATORY_CARE_PROVIDER_SITE_OTHER): Payer: Medicaid Other | Admitting: *Deleted

## 2015-09-14 DIAGNOSIS — I059 Rheumatic mitral valve disease, unspecified: Secondary | ICD-10-CM

## 2015-09-14 DIAGNOSIS — Z7901 Long term (current) use of anticoagulants: Secondary | ICD-10-CM

## 2015-09-14 DIAGNOSIS — Z954 Presence of other heart-valve replacement: Secondary | ICD-10-CM | POA: Diagnosis not present

## 2015-09-14 DIAGNOSIS — Z5181 Encounter for therapeutic drug level monitoring: Secondary | ICD-10-CM

## 2015-09-14 DIAGNOSIS — Z952 Presence of prosthetic heart valve: Secondary | ICD-10-CM

## 2015-09-14 LAB — POCT INR: INR: 1.9

## 2015-09-19 ENCOUNTER — Other Ambulatory Visit: Payer: Self-pay | Admitting: Internal Medicine

## 2015-09-19 ENCOUNTER — Other Ambulatory Visit: Payer: Self-pay | Admitting: Cardiology

## 2015-09-19 ENCOUNTER — Other Ambulatory Visit: Payer: Self-pay | Admitting: Family Medicine

## 2015-09-20 ENCOUNTER — Other Ambulatory Visit (HOSPITAL_COMMUNITY): Payer: Self-pay | Admitting: Cardiology

## 2015-09-20 MED ORDER — ALLOPURINOL 300 MG PO TABS: ORAL_TABLET | ORAL | Status: DC

## 2015-10-05 ENCOUNTER — Ambulatory Visit (INDEPENDENT_AMBULATORY_CARE_PROVIDER_SITE_OTHER): Payer: Medicaid Other | Admitting: *Deleted

## 2015-10-05 DIAGNOSIS — Z5181 Encounter for therapeutic drug level monitoring: Secondary | ICD-10-CM

## 2015-10-05 DIAGNOSIS — Z954 Presence of other heart-valve replacement: Secondary | ICD-10-CM | POA: Diagnosis not present

## 2015-10-05 DIAGNOSIS — I059 Rheumatic mitral valve disease, unspecified: Secondary | ICD-10-CM

## 2015-10-05 DIAGNOSIS — Z952 Presence of prosthetic heart valve: Secondary | ICD-10-CM

## 2015-10-05 DIAGNOSIS — Z7901 Long term (current) use of anticoagulants: Secondary | ICD-10-CM

## 2015-10-05 LAB — POCT INR: INR: 2

## 2015-10-17 ENCOUNTER — Other Ambulatory Visit: Payer: Self-pay | Admitting: Cardiology

## 2015-10-17 ENCOUNTER — Ambulatory Visit (INDEPENDENT_AMBULATORY_CARE_PROVIDER_SITE_OTHER): Payer: Medicaid Other | Admitting: *Deleted

## 2015-10-17 ENCOUNTER — Ambulatory Visit (HOSPITAL_COMMUNITY)
Admission: RE | Admit: 2015-10-17 | Discharge: 2015-10-17 | Disposition: A | Discharge status: Home / Self Care | Payer: Medicaid Other | Source: Ambulatory Visit | Attending: Cardiology | Admitting: Cardiology

## 2015-10-17 VITALS — BP 118/64 | HR 65 | Wt 162.0 lb

## 2015-10-17 DIAGNOSIS — I482 Chronic atrial fibrillation, unspecified: Secondary | ICD-10-CM

## 2015-10-17 DIAGNOSIS — Z952 Presence of prosthetic heart valve: Secondary | ICD-10-CM | POA: Diagnosis not present

## 2015-10-17 DIAGNOSIS — Z7901 Long term (current) use of anticoagulants: Secondary | ICD-10-CM

## 2015-10-17 DIAGNOSIS — Z954 Presence of other heart-valve replacement: Secondary | ICD-10-CM

## 2015-10-17 DIAGNOSIS — I5032 Chronic diastolic (congestive) heart failure: Secondary | ICD-10-CM | POA: Diagnosis not present

## 2015-10-17 DIAGNOSIS — I130 Hypertensive heart and chronic kidney disease with heart failure and stage 1 through stage 4 chronic kidney disease, or unspecified chronic kidney disease: Secondary | ICD-10-CM | POA: Insufficient documentation

## 2015-10-17 DIAGNOSIS — Z5181 Encounter for therapeutic drug level monitoring: Secondary | ICD-10-CM | POA: Diagnosis not present

## 2015-10-17 DIAGNOSIS — Z79899 Other long term (current) drug therapy: Secondary | ICD-10-CM | POA: Insufficient documentation

## 2015-10-17 DIAGNOSIS — Z7982 Long term (current) use of aspirin: Secondary | ICD-10-CM | POA: Diagnosis not present

## 2015-10-17 DIAGNOSIS — M109 Gout, unspecified: Secondary | ICD-10-CM | POA: Insufficient documentation

## 2015-10-17 DIAGNOSIS — N189 Chronic kidney disease, unspecified: Secondary | ICD-10-CM | POA: Insufficient documentation

## 2015-10-17 DIAGNOSIS — I059 Rheumatic mitral valve disease, unspecified: Secondary | ICD-10-CM | POA: Diagnosis not present

## 2015-10-17 DIAGNOSIS — I099 Rheumatic heart disease, unspecified: Secondary | ICD-10-CM | POA: Insufficient documentation

## 2015-10-17 DIAGNOSIS — E785 Hyperlipidemia, unspecified: Secondary | ICD-10-CM | POA: Diagnosis not present

## 2015-10-17 LAB — POCT INR: INR: 2.5

## 2015-10-17 MED ORDER — TORSEMIDE 20 MG PO TABS: 80.0000 mg | ORAL_TABLET | Freq: Two times a day (BID) | ORAL | Status: DC

## 2015-10-17 MED ORDER — MECLIZINE HCL 25 MG PO TABS: 25.0000 mg | ORAL_TABLET | Freq: Three times a day (TID) | ORAL | Status: DC | PRN

## 2015-10-17 NOTE — Progress Notes (Signed)
Patient ID: Deborah [name], female   DOB: Jun 28, 1951, 65 y.o.   MRN: [id number] PCP: Dr. Beryle Flock Cardiology: Dr. [name]  65 yo with rheumatic heart disease and MV replacement/TV repair, chronic atrial fibrillation, and chronic diastolic CHF.  At last appointment, torsemide was increased again due to volume overload.  She is still short of breath after walking about a block.  She fatigues easily.  She tries to walk for 30 minutes daily.  No lightheadedness. No edema.  No chest pain.  No BRBPR or melena.  Chronically sleeps on 2 pillows.  Weight is stable.   Labs (8/12): K 3.5, creatinine 0.98 Labs (10/12): K 3.8, creatinine 1.0, BNP 116 Labs (1/13): K 3.6, creatinine 0.9, LDL 85, HDL 62 Labs (9/13): K 3.9, creatinine 1.0 Labs (4/14): HCT 37.8 with elevated eosinophils, BNP 1094 => 82, K 3.6, creatinine 1.0 Labs (9/14): K 3.6, creatinine 0.89, hemoglobin 11 Labs (1/15): K 3.7, creatinine 0.9, HCT 36.9 Labs (2/15): LDL 99, HDL 50 Labs (3/15): K 4, creatinine 1.28 Labs (6/15): K 3.7, creatinine 1.0, BNP 124 => 80 Labs (02/22/14): K 3.5 Creatinine 0.9 Labs (10/15): K 3.3, creatinine 0.94, HCT 38.9 Labs (6/16): K 3.9, creatinine 1.1, HCT 36.4 Labs (9/16): K 3.9, creatinine 1.61, HCT 38.5 Labs (12/16): HCT 39.4, K 4.2, creatinine 1.1 Labs (1/17): K 4.2, creatinine 1.01, BNP 109  PMH: 1. Rheumatic heart disease: Status post mechanical MV replacement and tricuspid repair at [facility] in 2002.  Echo 5/12 with EF 60-65%, mild AI, mechanical MV prosthesis with normal function, severe LAE, moderate RAE, moderate to severe TR, mildly elevated PA systolic pressure.  Echo (6/15) EF 55-60%, normal RV, normal mechanical mitral valve, severe LAE, moderate TR.  Echo (9/16) with EF 55-60%, mild to moderate AI, mechanical mitral valve looks ok, s/p TV repair with mild TR, PASP 40.  2. Chronic atrial fibrillation on coumadin.  Event monitor (7/12-8/12) showed no high rate episodes (patient continuously in atrial  fibrillation).  3. Chronic diastolic CHF 4. ETT-myoview (7/12): No evidence for ischemia or infarction.  5. Impaired fasting glucose 6. Hyperlipidemia  7. HTN 8. Gout 9. Anemia 10. External hemorrhoids.  11. 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  13. CKD  SH: Nonsmoker. Lives in [place].   FH: No premature CAD  ROS: All systems reviewed and negative except as noted in HPI.    Current Outpatient Prescriptions  Medication Sig Dispense Refill  . acetaminophen (TYLENOL) 325 MG tablet Take 650 mg by mouth every 6 (six) hours as needed for pain.     Marland Kitchen allopurinol (ZYLOPRIM) 300 MG tablet TAKE 1 TABLET (300 MG TOTAL) BY MOUTH DAILY. 30 tablet 2  . aspirin EC 81 MG tablet Take 81 mg by mouth daily.    . cetirizine (ZYRTEC) 10 MG tablet Take 10 mg by mouth daily.    . cyanocobalamin 500 MCG tablet Take 1 tablet (500 mcg total) by mouth daily. 30 tablet 5  . diazepam (VALIUM) 5 MG tablet TAKE 1 TABLET BY MOUTH EVERY 8 HOURS AS NEEDED FOR ANXIETY    . diclofenac sodium (VOLTAREN) 1 % GEL Apply 2 g topically 4 (four) times daily. 100 g 3  . folic acid (FOLVITE) 1 MG tablet TAKE 1 TABLET (1 MG TOTAL) BY MOUTH DAILY. 30 tablet 6  . glucose blood test strip Use as instructed to test blood glucose BID 100 each 12  . loteprednol (LOTEMAX) 0.2 % SUSP 1 drop 4 (  four) times daily.    . metoprolol succinate (TOPROL-XL) 50 MG 24 hr tablet Take 1 tablet (50 mg total) by mouth daily. 45 tablet 3  . nitroGLYCERIN (NITROSTAT) 0.4 MG SL tablet Place 0.4 mg under the tongue every 5 (five) minutes as needed for chest pain.    Marland Kitchen omeprazole (PRILOSEC) 20 MG capsule TAKE ONE CAPSULE BY MOUTH EVERY DAY 30 capsule 6  . potassium chloride SA (K-DUR,KLOR-CON) 20 MEQ tablet Take 3 tablets (60 mEq total) by mouth 2 (two) times daily. 240 tablet 3  . simvastatin (ZOCOR) 10 MG tablet TAKE 1 TABLET BY MOUTH EVERY OTHER DAY 15 tablet 0  . torsemide  (DEMADEX) 20 MG tablet Take 4 tablets (80 mg total) by mouth 2 (two) times daily. 240 tablet 3  . traMADol (ULTRAM) 50 MG tablet Take by mouth every 6 (six) hours as needed for moderate pain.    Marland Kitchen warfarin (COUMADIN) 2 MG tablet TAKE ONE TABLET EVERY DAY EXCEPT ONE-HALF TABLET ON MONDAY AND WEDNESDAY OR AS DIRECTED by coumadin clinic 80 tablet 3  . meclizine (ANTIVERT) 25 MG tablet Take 1 tablet (25 mg total) by mouth 3 (three) times daily as needed for dizziness. 30 tablet 0   No current facility-administered medications for this encounter.    BP 118/64 mmHg  Pulse 65  Wt 162 lb (73.483 kg)  SpO2 97% General: NAD Neck: JVP  10-12 cm with HJR, no thyromegaly or thyroid nodule.  Lungs: Clear to auscultation bilaterally with normal respiratory effort. CV: Nondisplaced PMI.  Heart irregular S1/S2, no S3/S4, mechanical S1, 2/6 HSM LLSB.  No peripheral edema.  No carotid bruit.  Normal pedal pulses.  Abdomen: Soft, nontender, no hepatosplenomegaly, no distention.  Neurologic: Alert and oriented x 3.  Psych: Normal affect. Extremities: No clubbing or cyanosis.   Assessment/Plan: 1. Atrial fibrillation: Chronic.  Continue warfarin.  Rate is controlled.    2. Mechanical mitral valve: Continue ASA/coumadin, INR goal 2.5-3.5.  Will need bridging with heparin/Lovenox if has to come off coumadin.   3. Diastolic CHF:  Still some volume overload on exam.  NYHA class II symptoms.  Stable echo in 9/16.  - Increase torsemide to 80 mg bid.  - Needs to watch sodium intake closely.   - BMET/BNP in 10 days    4. Hyperlipidemia: Continue simvastatin.    Followup in 6 wks  Marca Ancona 10/17/2015

## 2015-10-17 NOTE — Patient Instructions (Signed)
INCREASE Torsemide to 80 mg (4 tabs) twice daily.  Return in 2 weeks for lab work.  Follow up in 6 weeks.

## 2015-10-31 ENCOUNTER — Ambulatory Visit (HOSPITAL_COMMUNITY)
Admission: RE | Admit: 2015-10-31 | Discharge: 2015-10-31 | Disposition: A | Discharge status: Home / Self Care | Payer: Medicaid Other | Source: Ambulatory Visit | Attending: Cardiology | Admitting: Cardiology

## 2015-10-31 DIAGNOSIS — I5032 Chronic diastolic (congestive) heart failure: Secondary | ICD-10-CM | POA: Insufficient documentation

## 2015-10-31 LAB — BASIC METABOLIC PANEL
Anion gap: 11 (ref 5–15)
BUN: 20 mg/dL (ref 6–20)
CHLORIDE: 105 mmol/L (ref 101–111)
CO2: 27 mmol/L (ref 22–32)
CREATININE: 1.02 mg/dL — AB (ref 0.44–1.00)
Calcium: 9.3 mg/dL (ref 8.9–10.3)
GFR calc Af Amer: >60 mL/min (ref >60)
GFR calc non Af Amer: 57 mL/min — ABNORMAL LOW (ref >60)
Glucose, Bld: 186 mg/dL — ABNORMAL HIGH (ref 65–99)
POTASSIUM: 3.9 mmol/L (ref 3.5–5.1)
SODIUM: 143 mmol/L (ref 135–145)

## 2015-10-31 LAB — BRAIN NATRIURETIC PEPTIDE: B Natriuretic Peptide: 114.9 pg/mL — ABNORMAL HIGH (ref 0.0–100.0)

## 2015-11-07 ENCOUNTER — Ambulatory Visit (INDEPENDENT_AMBULATORY_CARE_PROVIDER_SITE_OTHER): Payer: Medicaid Other | Admitting: Pharmacist

## 2015-11-07 DIAGNOSIS — Z952 Presence of prosthetic heart valve: Secondary | ICD-10-CM

## 2015-11-07 DIAGNOSIS — Z5181 Encounter for therapeutic drug level monitoring: Secondary | ICD-10-CM | POA: Diagnosis not present

## 2015-11-07 DIAGNOSIS — I059 Rheumatic mitral valve disease, unspecified: Secondary | ICD-10-CM | POA: Diagnosis not present

## 2015-11-07 DIAGNOSIS — Z954 Presence of other heart-valve replacement: Secondary | ICD-10-CM | POA: Diagnosis not present

## 2015-11-07 DIAGNOSIS — Z7901 Long term (current) use of anticoagulants: Secondary | ICD-10-CM | POA: Diagnosis not present

## 2015-11-07 LAB — POCT INR: INR: 2.4

## 2015-11-11 ENCOUNTER — Encounter: Payer: Self-pay | Admitting: Family Medicine

## 2015-11-11 ENCOUNTER — Ambulatory Visit (INDEPENDENT_AMBULATORY_CARE_PROVIDER_SITE_OTHER): Payer: Medicaid Other | Admitting: Family Medicine

## 2015-11-11 VITALS — BP 109/67 | HR 66 | Temp 97.8°F | Wt 163.0 lb

## 2015-11-11 DIAGNOSIS — Z23 Encounter for immunization: Secondary | ICD-10-CM | POA: Diagnosis not present

## 2015-11-11 DIAGNOSIS — B9789 Other viral agents as the cause of diseases classified elsewhere: Principal | ICD-10-CM

## 2015-11-11 DIAGNOSIS — J069 Acute upper respiratory infection, unspecified: Secondary | ICD-10-CM

## 2015-11-11 DIAGNOSIS — Z0000 Encounter for general adult medical examination without abnormal findings: Secondary | ICD-10-CM | POA: Diagnosis not present

## 2015-11-11 DIAGNOSIS — E118 Type 2 diabetes mellitus with unspecified complications: Secondary | ICD-10-CM | POA: Diagnosis not present

## 2015-11-11 MED ORDER — BENZONATATE 100 MG PO CAPS: 100.0000 mg | ORAL_CAPSULE | Freq: Two times a day (BID) | ORAL | Status: DC | PRN

## 2015-11-11 NOTE — Progress Notes (Signed)
Date of Visit: 11/11/2015   HPI:  Patient presents for a same day appointment to discuss cough and sore throat. Has had cough for about 2 days. Occasionally produces clear sputum. Also has sore throat. Had light headache yesterday. Felt cold but no documented fevers. No body aches. Mild bilateral shoulder pain. Eating and drinking well. Stooling and urinating normally. Has not tried any medications for this. No significant nasal congestion.   ROS: See HPI  PMFSH: history of chronic afib, chronic diastolic CHF, hyperlipidemia, diabetes, prosthetic heart valve after rheumatic heart disease, HgbE disease  PHYSICAL EXAM: BP 109/67 mmHg  Pulse 66  Temp(Src) 97.8 F (36.6 C) (Oral)  Wt 163 lb (73.936 kg) Gen: NAD, pleasant, cooperative, well appearing HEENT: normocephalic, atraumatic. Nares patent. Tympanic membranes clear bilaterally. No anterior cervical or supraclavicular lymphadenopathy..  Heart: regular rate and rhythm, no murmur Lungs: clear to auscultation bilaterally, normal work of breathing, no crackles or wheezes Abdomen: soft nontender to palpation  Neuro: alert, grossly nonfocal, speech normal   ASSESSMENT/PLAN:  1. Viral URI - symptoms consistent with viral URI. No signs of bacterial infection at present. Patient is well appearing and afebrile. Will treat symptomatically with cough drops, mucinex, tessalon. Encouraged hydration. Follow up next week if not improving. Handout given.   FOLLOW UP: Follow up as needed if symptoms worsen or fail to improve.    GrenadaBrittany J. Pollie MeyerMcIntyre, MD [facility]

## 2015-11-11 NOTE — Patient Instructions (Signed)
Cough drops mucinex (guaifenesin) Sent in tessalon for you Drink plenty of fluids Follow up if not better next week (Monday or Tuesday)  Be well, Dr. Pollie MeyerMcIntyre   Upper Respiratory Infection, Adult Most upper respiratory infections (URIs) are a viral infection of the air passages leading to the lungs. A URI affects the nose, throat, and upper air passages. The most common type of URI is nasopharyngitis and is typically referred to as "the common cold." URIs run their course and usually go away on their own. Most of the time, a URI does not require medical attention, but sometimes a bacterial infection in the upper airways can follow a viral infection. This is called a secondary infection. Sinus and middle ear infections are common types of secondary upper respiratory infections. Bacterial pneumonia can also complicate a URI. A URI can worsen asthma and chronic obstructive pulmonary disease (COPD). Sometimes, these complications can require emergency medical care and may be life threatening.  CAUSES Almost all URIs are caused by viruses. A virus is a type of germ and can spread from one person to another.  RISKS FACTORS You may be at risk for a URI if:   You smoke.   You have chronic heart or lung disease.  You have a weakened defense (immune) system.   You are very young or very old.   You have nasal allergies or asthma.  You work in crowded or poorly ventilated areas.  You work in health care facilities or schools. SIGNS AND SYMPTOMS  Symptoms typically develop 2-3 days after you come in contact with a cold virus. Most viral URIs last 7-10 days. However, viral URIs from the influenza virus (flu virus) can last 14-18 days and are typically more severe. Symptoms may include:   Runny or stuffy (congested) nose.   Sneezing.   Cough.   Sore throat.   Headache.   Fatigue.   Fever.   Loss of appetite.   Pain in your forehead, behind your eyes, and over your  cheekbones (sinus pain).  Muscle aches.  DIAGNOSIS  Your health care provider may diagnose a URI by:  Physical exam.  Tests to check that your symptoms are not due to another condition such as:  Strep throat.  Sinusitis.  Pneumonia.  Asthma. TREATMENT  A URI goes away on its own with time. It cannot be cured with medicines, but medicines may be prescribed or recommended to relieve symptoms. Medicines may help:  Reduce your fever.  Reduce your cough.  Relieve nasal congestion. HOME CARE INSTRUCTIONS   Take medicines only as directed by your health care provider.   Gargle warm saltwater or take cough drops to comfort your throat as directed by your health care provider.  Use a warm mist humidifier or inhale steam from a shower to increase air moisture. This may make it easier to breathe.  Drink enough fluid to keep your urine clear or pale yellow.   Eat soups and other clear broths and maintain good nutrition.   Rest as needed.   Return to work when your temperature has returned to normal or as your health care provider advises. You may need to stay home longer to avoid infecting others. You can also use a face mask and careful hand washing to prevent spread of the virus.  Increase the usage of your inhaler if you have asthma.   Do not use any tobacco products, including cigarettes, chewing tobacco, or electronic cigarettes. If you need help quitting, ask your health  care provider. PREVENTION  The best way to protect yourself from getting a cold is to practice good hygiene.   Avoid oral or hand contact with people with cold symptoms.   Wash your hands often if contact occurs.  There is no clear evidence that vitamin C, vitamin E, echinacea, or exercise reduces the chance of developing a cold. However, it is always recommended to get plenty of rest, exercise, and practice good nutrition.  SEEK MEDICAL CARE IF:   You are getting worse rather than better.    Your symptoms are not controlled by medicine.   You have chills.  You have worsening shortness of breath.  You have brown or red mucus.  You have yellow or brown nasal discharge.  You have pain in your face, especially when you bend forward.  You have a fever.  You have swollen neck glands.  You have pain while swallowing.  You have white areas in the back of your throat. SEEK IMMEDIATE MEDICAL CARE IF:   You have severe or persistent:  Headache.  Ear pain.  Sinus pain.  Chest pain.  You have chronic lung disease and any of the following:  Wheezing.  Prolonged cough.  Coughing up blood.  A change in your usual mucus.  You have a stiff neck.  You have changes in your:  Vision.  Hearing.  Thinking.  Mood. MAKE SURE YOU:   Understand these instructions.  Will watch your condition.  Will get help right away if you are not doing well or get worse.   This information is not intended to replace advice given to you by your health care provider. Make sure you discuss any questions you have with your health care provider.   Document Released: 02/06/2001 Document Revised: 12/28/2014 Document Reviewed: 11/18/2013 Elsevier Interactive Patient Education [employer]

## 2015-11-16 ENCOUNTER — Ambulatory Visit (INDEPENDENT_AMBULATORY_CARE_PROVIDER_SITE_OTHER): Payer: Medicaid Other | Admitting: Family Medicine

## 2015-11-16 ENCOUNTER — Encounter: Payer: Self-pay | Admitting: Family Medicine

## 2015-11-16 VITALS — BP 116/49 | HR 87 | Temp 98.1°F | Ht 59.0 in | Wt 160.8 lb

## 2015-11-16 DIAGNOSIS — J189 Pneumonia, unspecified organism: Secondary | ICD-10-CM

## 2015-11-16 DIAGNOSIS — Z7901 Long term (current) use of anticoagulants: Secondary | ICD-10-CM | POA: Diagnosis not present

## 2015-11-16 DIAGNOSIS — Z23 Encounter for immunization: Secondary | ICD-10-CM | POA: Diagnosis not present

## 2015-11-16 DIAGNOSIS — Z0000 Encounter for general adult medical examination without abnormal findings: Secondary | ICD-10-CM | POA: Diagnosis not present

## 2015-11-16 DIAGNOSIS — E118 Type 2 diabetes mellitus with unspecified complications: Secondary | ICD-10-CM | POA: Diagnosis not present

## 2015-11-16 LAB — POCT INR: INR: 2.1

## 2015-11-16 MED ORDER — DOXYCYCLINE HYCLATE 100 MG PO TABS: 100.0000 mg | ORAL_TABLET | Freq: Two times a day (BID) | ORAL | Status: DC

## 2015-11-16 MED ORDER — HYDROCODONE-HOMATROPINE 5-1.5 MG/5ML PO SYRP
5.0000 mL | ORAL_SOLUTION | Freq: Four times a day (QID) | ORAL | Status: DC | PRN
Start: 2015-11-16 — End: 2016-04-14

## 2015-11-16 NOTE — Patient Instructions (Signed)
Sent in doxycycline - antibiotic Please schedule a visit on Monday to check your INR after being on the doxycycline as this can interact with Coumadin Cough syrup as needed, use sparingly. Caution as it might make you sleepy  Follow up later this week or early next week if not getting better  Be well, Dr. Pollie MeyerMcIntyre  Community-Acquired Pneumonia, Adult Pneumonia is an infection of the lungs. There are different types of pneumonia. One type can develop while a person is in a hospital. A different type, called community-acquired pneumonia, develops in people who are not, or have not recently been, in the hospital or other health care facility.  CAUSES Pneumonia may be caused by bacteria, viruses, or funguses. Community-acquired pneumonia is often caused by Streptococcus pneumonia bacteria. These bacteria are often passed from one person to another by breathing in droplets from the cough or sneeze of an infected person. RISK FACTORS The condition is more likely to develop in:  People who havechronic diseases, such as chronic obstructive pulmonary disease (COPD), asthma, congestive heart failure, cystic fibrosis, diabetes, or kidney disease.  People who haveearly-stage or late-stage HIV.  People who havesickle cell disease.  People who havehad their spleen removed (splenectomy).  People who havepoor Administratordental hygiene.  People who havemedical conditions that increase the risk of breathing in (aspirating) secretions their own mouth and nose.   People who havea weakened immune system (immunocompromised).  People who smoke.  People whotravel to areas where pneumonia-causing germs commonly exist.  People whoare around animal habitats or animals that have pneumonia-causing germs, including birds, bats, rabbits, cats, and farm animals. SYMPTOMS Symptoms of this condition include:  Adry cough.  A wet (productive) cough.  Fever.  Sweating.  Chest pain, especially when breathing  deeply or coughing.  Rapid breathing or difficulty breathing.  Shortness of breath.  Shaking chills.  Fatigue.  Muscle aches. DIAGNOSIS Your health care provider will take a medical history and perform a physical exam. You may also have other tests, including:  Imaging studies of your chest, including X-rays.  Tests to check your blood oxygen level and other blood gases.  Other tests on blood, mucus (sputum), fluid around your lungs (pleural fluid), and urine. If your pneumonia is severe, other tests may be done to identify the specific cause of your illness. TREATMENT The type of treatment that you receive depends on many factors, such as the cause of your pneumonia, the medicines you take, and other medical conditions that you have. For most adults, treatment and recovery from pneumonia may occur at home. In some cases, treatment must happen in a hospital. Treatment may include:  Antibiotic medicines, if the pneumonia was caused by bacteria.  Antiviral medicines, if the pneumonia was caused by a virus.  Medicines that are given by mouth or through an IV tube.  Oxygen.  Respiratory therapy. Although rare, treating severe pneumonia may include:  Mechanical ventilation. This is done if you are not breathing well on your own and you cannot maintain a safe blood oxygen level.  Thoracentesis. This procedureremoves fluid around one lung or both lungs to help you breathe better. HOME CARE INSTRUCTIONS  Take over-the-counter and prescription medicines only as told by your health care provider.  Only takecough medicine if you are losing sleep. Understand that cough medicine can prevent your body's natural ability to remove mucus from your lungs.  If you were prescribed an antibiotic medicine, take it as told by your health care provider. Do not stop taking the antibiotic  even if you start to feel better.  Sleep in a semi-upright position at night. Try sleeping in a reclining  chair, or place a few pillows under your head.  Do not use tobacco products, including cigarettes, chewing tobacco, and e-cigarettes. If you need help quitting, ask your health care provider.  Drink enough water to keep your urine clear or pale yellow. This will help to thin out mucus secretions in your lungs. PREVENTION There are ways that you can decrease your risk of developing community-acquired pneumonia. Consider getting a pneumococcal vaccine if:  You are older than 63 years of age.  You are older than 63 years of age and are undergoing cancer treatment, have chronic lung disease, or have other medical conditions that affect your immune system. Ask your health care provider if this applies to you. There are different types and schedules of pneumococcal vaccines. Ask your health care provider which vaccination option is best for you. You may also prevent community-acquired pneumonia if you take these actions:  Get an influenza vaccine every year. Ask your health care provider which type of influenza vaccine is best for you.  Go to the dentist on a regular basis.  Wash your hands often. Use hand sanitizer if soap and water are not available. SEEK MEDICAL CARE IF:  You have a fever.  You are losing sleep because you cannot control your cough with cough medicine. SEEK IMMEDIATE MEDICAL CARE IF:  You have worsening shortness of breath.  You have increased chest pain.  Your sickness becomes worse, especially if you are an older adult or have a weakened immune system.  You cough up blood.   This information is not intended to replace advice given to you by your health care provider. Make sure you discuss any questions you have with your health care provider.   Document Released: 08/13/2005 Document Revised: 05/04/2015 Document Reviewed: 12/08/2014 Elsevier Interactive Patient Education [employer]

## 2015-11-16 NOTE — Progress Notes (Signed)
Date of Visit: 11/16/2015   HPI:  Patient presents for a same day appointment to discuss continued cough. Has had chills and sweats. Tried tussionex with some relief but is almost out. Cough productive of yellow and green sputum. No nasal congestion. Feels achy. No vomiting. Drinking normally but decreased appetite. Stooling and urinating normally. Symptoms present for one week.   ROS: See HPI  PMFSH: history of chronic afib, valvular replacement on coumadin, hyperlipidemia, type 2 diabetes, gout, GERD  PHYSICAL EXAM: BP 116/49 mmHg  Pulse 87  Temp(Src) 98.1 F (36.7 C) (Oral)  Ht 4\' 11"  (1.499 m)  Wt 160 lb 12.8 oz (72.938 kg)  BMI 32.46 kg/m2 Gen: NAD, pleasant, cooperative HEENT: normocephalic, atraumatic, moist mucous membranes. Tympanic membranes clear bilaterally. No anterior cervical or supraclavicular lymphadenopathy. Nares patent Heart: regular rate and rhythm, no murmur Lungs: clear to auscultation bilaterally normal work of breathing, no crackles or wheezes Abdomen: soft nontender to palpation  Neuro: alert grossly nonfocal, speech normal  ASSESSMENT/PLAN:  1. Cough - with presence for 1 week, not improving and associated with chills and sweats concerning for CAP. No recent antibiotics. Discussed options with patient of obtaining CXR versus just treating empirically with antibiotics. She prefers antibiotics. Will rx doxycycline given history of diabetes. Follow up if not improving. Encouraged hydration. Will also rx hycodan cough syrup for relief.  2. Chronic anticoagulation - knowing that doxycycline can impact INR, obtained INR today which was low at 2.1 (goal is 2.5-3.5 with history of prosthetic valve). Doxycycline is expected to increase INR. Continue current coumadin dosing, follow up on Monday for repeat INR to ensure not supratherapeutic.  FOLLOW UP: Follow up on Monday for INR check  GrenadaBrittany J. Pollie MeyerMcIntyre, MD [facility]

## 2015-11-21 ENCOUNTER — Ambulatory Visit
Admission: RE | Admit: 2015-11-21 | Discharge: 2015-11-21 | Disposition: A | Discharge status: Home / Self Care | Payer: Medicaid Other | Source: Ambulatory Visit | Attending: Family Medicine | Admitting: Family Medicine

## 2015-11-21 ENCOUNTER — Ambulatory Visit (INDEPENDENT_AMBULATORY_CARE_PROVIDER_SITE_OTHER): Payer: Medicaid Other | Admitting: Family Medicine

## 2015-11-21 ENCOUNTER — Ambulatory Visit (HOSPITAL_COMMUNITY)
Admission: RE | Admit: 2015-11-21 | Discharge: 2015-11-21 | Disposition: A | Discharge status: Home / Self Care | Payer: Medicaid Other | Source: Ambulatory Visit | Attending: Family Medicine | Admitting: Family Medicine

## 2015-11-21 ENCOUNTER — Encounter: Payer: Self-pay | Admitting: Family Medicine

## 2015-11-21 VITALS — BP 123/80 | HR 81 | Temp 98.1°F | Ht 59.0 in | Wt 157.0 lb

## 2015-11-21 DIAGNOSIS — I4891 Unspecified atrial fibrillation: Secondary | ICD-10-CM | POA: Diagnosis not present

## 2015-11-21 DIAGNOSIS — R079 Chest pain, unspecified: Secondary | ICD-10-CM

## 2015-11-21 DIAGNOSIS — Z7901 Long term (current) use of anticoagulants: Secondary | ICD-10-CM

## 2015-11-21 DIAGNOSIS — R9431 Abnormal electrocardiogram [ECG] [EKG]: Secondary | ICD-10-CM | POA: Insufficient documentation

## 2015-11-21 DIAGNOSIS — R05 Cough: Secondary | ICD-10-CM | POA: Diagnosis not present

## 2015-11-21 DIAGNOSIS — R059 Cough, unspecified: Secondary | ICD-10-CM

## 2015-11-21 DIAGNOSIS — Z23 Encounter for immunization: Secondary | ICD-10-CM | POA: Diagnosis not present

## 2015-11-21 DIAGNOSIS — E118 Type 2 diabetes mellitus with unspecified complications: Secondary | ICD-10-CM | POA: Diagnosis not present

## 2015-11-21 DIAGNOSIS — Z0000 Encounter for general adult medical examination without abnormal findings: Secondary | ICD-10-CM | POA: Diagnosis not present

## 2015-11-21 LAB — POCT INR: INR: 2.6

## 2015-11-21 NOTE — Patient Instructions (Signed)
Please go get chest xray today Want to make sure everything looks okay Finish doxycycline  Follow up with the anticoagulation clinic in 1 week  If the chest heaviness recurs or worsens go to ER  Be well, Dr. Pollie MeyerMcIntyre

## 2015-11-21 NOTE — Progress Notes (Signed)
Date of Visit: 11/21/2015   HPI:  Patient presents for follow up. I saw her last week and started doxycycline for possible pneumonia.   Patient reports that her cough is better. Still feels chills occasionally. No bleeding. Does note now heaviness in left chest that radiates to her back. Began noticing that after her appointment with me last week. Has taken extra torsemide. Mildly shortness of breath with climbing stairs, which is chronic for her. Chest discomfort is worse with ambulation. Not associated with nausea. Patient has significant cardiology history and is followed by CHF team. Has appointment there on April 7.   ROS: See HPI.  PMFSH: history of chronic afib on coumadin, chronic diastolic CHF, type 2 diabetes, tricuspid valve repair, mitral valve replacement, GERD, gout  PHYSICAL EXAM: BP 123/80 mmHg  Pulse 81  Temp(Src) 98.1 F (36.7 C) (Oral)  Ht 4\' 11"  (1.499 m)  Wt 157 lb (71.215 kg)  BMI 31.69 kg/m2 Gen: NAD, pleasant, cooperative HEENT: normocephalic, atraumatic, moist mucous membranes  Heart: irregularly irregular rhythm, normal rate, no appreciable murmurs Lungs: clear to auscultation bilaterally, normal work of breathing, no crackles or wheezes Neuro: alert, grossly nonfocal, speech normal Ext: No appreciable lower extremity edema bilaterally   ASSESSMENT/PLAN:  1. Chest pain - newly noted since the last time I saw her. Some typical anginal components (L sided, worse with exertion). EKG performed today and is unchanged from prior. Reviewed records, [facility] results in October 2016. I called and spoke with patient's primary cardiologist Dr. Shirlee LatchMcLean, who did not feel these symptoms were cardiac in etiology, likely more related to the pneumonia/respiratory infection she's been treated for. Patient has upcoming follow up in place. Plan: - check CXR to ensure no pleural effusion or other acute pulmonary pathology - discussed  ED precautions at length - follow up with cardiology as scheduled  2. Anticoagulation - as expected, INR increased in therapeutic range due to doxycycline therapy. Continue current regimen, follow up for regularly scheduled INR check next week.  FOLLOW UP: Follow up as needed if symptoms worsen or fail to improve.    GrenadaBrittany J. Pollie MeyerMcIntyre, MD [facility]

## 2015-11-23 ENCOUNTER — Encounter: Payer: Self-pay | Admitting: Family Medicine

## 2015-11-27 ENCOUNTER — Other Ambulatory Visit: Payer: Self-pay | Admitting: Internal Medicine

## 2015-11-28 ENCOUNTER — Other Ambulatory Visit (HOSPITAL_COMMUNITY): Payer: Self-pay | Admitting: *Deleted

## 2015-11-28 ENCOUNTER — Ambulatory Visit (INDEPENDENT_AMBULATORY_CARE_PROVIDER_SITE_OTHER): Payer: Medicaid Other | Admitting: Pharmacist

## 2015-11-28 DIAGNOSIS — Z954 Presence of other heart-valve replacement: Secondary | ICD-10-CM | POA: Diagnosis not present

## 2015-11-28 DIAGNOSIS — Z7901 Long term (current) use of anticoagulants: Secondary | ICD-10-CM | POA: Diagnosis not present

## 2015-11-28 DIAGNOSIS — Z5181 Encounter for therapeutic drug level monitoring: Secondary | ICD-10-CM | POA: Diagnosis not present

## 2015-11-28 DIAGNOSIS — I059 Rheumatic mitral valve disease, unspecified: Secondary | ICD-10-CM | POA: Diagnosis not present

## 2015-11-28 DIAGNOSIS — Z952 Presence of prosthetic heart valve: Secondary | ICD-10-CM

## 2015-11-28 LAB — POCT INR: INR: 2.6

## 2015-11-28 MED ORDER — SIMVASTATIN 10 MG PO TABS: 10.0000 mg | ORAL_TABLET | ORAL | Status: DC

## 2015-11-29 ENCOUNTER — Other Ambulatory Visit: Payer: Self-pay | Admitting: Internal Medicine

## 2015-12-02 ENCOUNTER — Ambulatory Visit (HOSPITAL_COMMUNITY)
Admission: RE | Admit: 2015-12-02 | Discharge: 2015-12-02 | Disposition: A | Discharge status: Home / Self Care | Payer: Medicaid Other | Source: Ambulatory Visit | Attending: Cardiology | Admitting: Cardiology

## 2015-12-02 ENCOUNTER — Encounter (HOSPITAL_COMMUNITY): Payer: Self-pay

## 2015-12-02 VITALS — BP 140/86 | HR 76 | Resp 20 | Wt 162.8 lb

## 2015-12-02 DIAGNOSIS — Z7982 Long term (current) use of aspirin: Secondary | ICD-10-CM | POA: Insufficient documentation

## 2015-12-02 DIAGNOSIS — I130 Hypertensive heart and chronic kidney disease with heart failure and stage 1 through stage 4 chronic kidney disease, or unspecified chronic kidney disease: Secondary | ICD-10-CM | POA: Diagnosis not present

## 2015-12-02 DIAGNOSIS — I5032 Chronic diastolic (congestive) heart failure: Secondary | ICD-10-CM | POA: Insufficient documentation

## 2015-12-02 DIAGNOSIS — R079 Chest pain, unspecified: Secondary | ICD-10-CM | POA: Diagnosis not present

## 2015-12-02 DIAGNOSIS — M109 Gout, unspecified: Secondary | ICD-10-CM | POA: Insufficient documentation

## 2015-12-02 DIAGNOSIS — Z79899 Other long term (current) drug therapy: Secondary | ICD-10-CM | POA: Insufficient documentation

## 2015-12-02 DIAGNOSIS — I482 Chronic atrial fibrillation: Secondary | ICD-10-CM | POA: Diagnosis not present

## 2015-12-02 DIAGNOSIS — Z952 Presence of prosthetic heart valve: Secondary | ICD-10-CM | POA: Insufficient documentation

## 2015-12-02 DIAGNOSIS — N189 Chronic kidney disease, unspecified: Secondary | ICD-10-CM | POA: Insufficient documentation

## 2015-12-02 DIAGNOSIS — E785 Hyperlipidemia, unspecified: Secondary | ICD-10-CM | POA: Insufficient documentation

## 2015-12-02 DIAGNOSIS — Z7901 Long term (current) use of anticoagulants: Secondary | ICD-10-CM | POA: Diagnosis not present

## 2015-12-02 DIAGNOSIS — I099 Rheumatic heart disease, unspecified: Secondary | ICD-10-CM | POA: Diagnosis not present

## 2015-12-02 LAB — COMPREHENSIVE METABOLIC PANEL
ALBUMIN: 4 g/dL (ref 3.5–5.0)
ALT: 39 U/L (ref 14–54)
ANION GAP: 12 (ref 5–15)
AST: 36 U/L (ref 15–41)
Alkaline Phosphatase: 83 U/L (ref 38–126)
BILIRUBIN TOTAL: 1.6 mg/dL — AB (ref 0.3–1.2)
BUN: 13 mg/dL (ref 6–20)
CO2: 25 mmol/L (ref 22–32)
Calcium: 9.4 mg/dL (ref 8.9–10.3)
Chloride: 104 mmol/L (ref 101–111)
Creatinine, Ser: 0.98 mg/dL (ref 0.44–1.00)
GFR calc Af Amer: >60 mL/min (ref >60)
Glucose, Bld: 195 mg/dL — ABNORMAL HIGH (ref 65–99)
POTASSIUM: 4 mmol/L (ref 3.5–5.1)
Sodium: 141 mmol/L (ref 135–145)
TOTAL PROTEIN: 7.5 g/dL (ref 6.5–8.1)

## 2015-12-02 LAB — BRAIN NATRIURETIC PEPTIDE: B NATRIURETIC PEPTIDE 5: 82.7 pg/mL (ref 0.0–100.0)

## 2015-12-02 NOTE — Progress Notes (Signed)
Advanced Heart Failure Medication Review by a Pharmacist  Does the patient  feel that his/her medications are working for him/her?  yes  Has the patient been experiencing any side effects to the medications prescribed?  Yes, dizziness which has decreased since decreasing metop dose  Does the patient measure his/her own blood pressure or blood glucose at home?  yes   Does the patient have any problems obtaining medications due to transportation or finances?   no  Understanding of regimen: excellent Understanding of indications: excellent Potential of compliance: excellent Patient understands to avoid NSAIDs. Patient understands to avoid decongestants.  Issues to address at subsequent visits: dizziness   Pharmacist comments: [name] is a 65 yo woman here for f/u in HF clinic. She feels more fatigued in the past few days, however dizziness has decreased since her metoprolol dose has been lowered. Still has occasional dizziness. Measures her BP regularly, SBP usually 110s-120s. SBP 140 this AM at home. Has excellent comprehension of medication regimen and great medication adherence.  Did mention concern regarding long-term omeprazole use. She read online long-term effects might affect liver, kidneys, and bones. However, she has severe GERD and is unable to tolerate coming off of PPI. Relayed her concerns to Dr. Shirlee LatchMcLean. Also requested refill for simvastatin, which she has run out of.     Deborah [name], [address] BoulevardPharm.D., BCPS PGY2 Cardiology Pharmacy Resident Pager: [phone]  Time with patient: 20 min Preparation and documentation time: 5 min Total time: 25 min

## 2015-12-02 NOTE — Patient Instructions (Signed)
Routine lab work today. Will notify you of abnormal results, otherwise no news is good news!  Follow up 4 months with Dr. McLean.  Do the following things EVERYDAY: 1) Weigh yourself in the morning before breakfast. Write it down and keep it in a log. 2) Take your medicines as prescribed 3) Eat low salt foods-Limit salt (sodium) to 2000 mg per day.  4) Stay as active as you can everyday 5) Limit all fluids for the day to less than 2 liters 

## 2015-12-04 NOTE — Progress Notes (Signed)
Patient ID: Deborah [name], female   DOB: Jun 24, 1951, 65 y.o.   MRN: [id number] PCP: Dr. Beryle Flock Cardiology: Dr. [name]  65 yo with rheumatic heart disease and MV replacement/TV repair, chronic atrial fibrillation, and chronic diastolic CHF.  She was recently treated for possible PNA versus acute bronchitis.  During this episode, she had somewhat atypical (nonexertional) chest pain.  She is no longer having any chest pain. Her breathing is ok, no dyspnea walking on flat ground if she walks slowly.  +Dyspnea with walking up stairs. No BRBPR or melena.  Chronically sleeps on 2 pillows.  Weight is stable.   ECG (3/17): atrial fibrillation, otherwise normal  Labs (8/12): K 3.5, creatinine 0.98 Labs (10/12): K 3.8, creatinine 1.0, BNP 116 Labs (1/13): K 3.6, creatinine 0.9, LDL 85, HDL 62 Labs (9/13): K 3.9, creatinine 1.0 Labs (4/14): HCT 37.8 with elevated eosinophils, BNP 1094 => 82, K 3.6, creatinine 1.0 Labs (9/14): K 3.6, creatinine 0.89, hemoglobin 11 Labs (1/15): K 3.7, creatinine 0.9, HCT 36.9 Labs (2/15): LDL 99, HDL 50 Labs (3/15): K 4, creatinine 1.28 Labs (6/15): K 3.7, creatinine 1.0, BNP 124 => 80 Labs (02/22/14): K 3.5 Creatinine 0.9 Labs (10/15): K 3.3, creatinine 0.94, HCT 38.9 Labs (6/16): K 3.9, creatinine 1.1, HCT 36.4 Labs (9/16): K 3.9, creatinine 1.61, HCT 38.5 Labs (12/16): HCT 39.4, K 4.2, creatinine 1.1 Labs (1/17): K 4.2, creatinine 1.01, BNP 109 Labs (3/17): K 3.9, creatinine 1.02  PMH: 1. Rheumatic heart disease: Status post mechanical MV replacement and tricuspid repair at [facility] in 2002.  Echo 5/12 with EF 60-65%, mild AI, mechanical MV prosthesis with normal function, severe LAE, moderate RAE, moderate to severe TR, mildly elevated PA systolic pressure.  Echo (6/15) EF 55-60%, normal RV, normal mechanical mitral valve, severe LAE, moderate TR.  Echo (9/16) with EF 55-60%, mild to moderate AI, mechanical mitral valve looks ok, s/p TV repair with mild TR, PASP 40.   2. Chronic atrial fibrillation on coumadin.  Event monitor (7/12-8/12) showed no high rate episodes (patient continuously in atrial fibrillation).  3. Chronic diastolic CHF 4. ETT-myoview (7/12): No evidence for ischemia or infarction.  5. Impaired fasting glucose 6. Hyperlipidemia  7. HTN 8. Gout 9. Anemia 10. External hemorrhoids.  11. 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  13. CKD  SH: Nonsmoker. Lives in [place].   FH: No premature CAD  ROS: All systems reviewed and negative except as noted in HPI.    Current Outpatient Prescriptions  Medication Sig Dispense Refill  . allopurinol (ZYLOPRIM) 300 MG tablet TAKE ONE TABLET BY MOUTH EVERY DAY 30 tablet 3  . aspirin EC 81 MG tablet Take 81 mg by mouth daily.    . cyanocobalamin 500 MCG tablet Take 1 tablet (500 mcg total) by mouth daily. 30 tablet 5  . diazepam (VALIUM) 5 MG tablet TAKE 1 TABLET BY MOUTH EVERY 8 HOURS AS NEEDED FOR ANXIETY    . diclofenac sodium (VOLTAREN) 1 % GEL Apply 2 g topically 4 (four) times daily. 100 g 3  . folic acid (FOLVITE) 1 MG tablet TAKE ONE TABLET BY MOUTH EVERY DAY 30 tablet 3  . isosorbide mononitrate (IMDUR) 30 MG 24 hr tablet Take 30 mg by mouth daily.    Marland Kitchen loratadine (CLARITIN) 10 MG tablet Take 10 mg by mouth daily.    Marland Kitchen loteprednol (LOTEMAX) 0.2 % SUSP 1 drop 4 (four) times daily.    . meclizine (ANTIVERT)  25 MG tablet Take 1 tablet (25 mg total) by mouth 3 (three) times daily as needed for dizziness. 30 tablet 0  . metoprolol succinate (TOPROL-XL) 50 MG 24 hr tablet Take 1 tablet (50 mg total) by mouth daily. 45 tablet 3  . olopatadine (PATANOL) 0.1 % ophthalmic solution Place 1 drop into both eyes 2 (two) times daily.    Marland Kitchen. omeprazole (PRILOSEC) 20 MG capsule TAKE ONE CAPSULE BY MOUTH EVERY DAY 30 capsule 6  . potassium chloride SA (K-DUR,KLOR-CON) 20 MEQ tablet Take 3 tablets (60 mEq total) by mouth 2 (two) times daily.  240 tablet 3  . simvastatin (ZOCOR) 10 MG tablet Take 1 tablet (10 mg total) by mouth every other day. 15 tablet 6  . torsemide (DEMADEX) 20 MG tablet Take 4 tablets (80 mg total) by mouth 2 (two) times daily. 240 tablet 3  . warfarin (COUMADIN) 2 MG tablet TAKE ONE TABLET EVERY DAY EXCEPT ONE-HALF TABLET ON MONDAY AND WEDNESDAY OR AS DIRECTED by coumadin clinic 80 tablet 3  . acetaminophen (TYLENOL) 325 MG tablet Take 650 mg by mouth every 6 (six) hours as needed for pain.     Marland Kitchen. glucose blood test strip Use as instructed to test blood glucose BID 100 each 12  . HYDROcodone-homatropine (HYCODAN) 5-1.5 MG/5ML syrup Take 5 mLs by mouth every 6 (six) hours as needed for cough. 60 mL 0  . nitroGLYCERIN (NITROSTAT) 0.4 MG SL tablet Place 0.4 mg under the tongue every 5 (five) minutes as needed for chest pain.     No current facility-administered medications for this encounter.    BP 140/86 mmHg  Pulse 76  Resp 20  Wt 162 lb 12 oz (73.823 kg)  SpO2 100% General: NAD Neck: JVP 7 cm, no thyromegaly or thyroid nodule.  Lungs: Clear to auscultation bilaterally with normal respiratory effort. CV: Nondisplaced PMI.  Heart irregular S1/S2, no S3/S4, mechanical S1, 2/6 HSM LLSB.  No peripheral edema.  No carotid bruit.  Normal pedal pulses.  Abdomen: Soft, nontender, no hepatosplenomegaly, no distention.  Neurologic: Alert and oriented x 3.  Psych: Normal affect. Extremities: No clubbing or cyanosis.   Assessment/Plan: 1. Atrial fibrillation: Chronic.  Continue warfarin.  Rate is controlled.    2. Mechanical mitral valve: Continue ASA/coumadin, INR goal 2.5-3.5.  Will need bridging with heparin/Lovenox if has to come off coumadin.   3. Diastolic CHF:  Volume status appears ok today.  NYHA class II symptoms.  Stable echo in 9/16.  - Continue torsemide 80 mg bid.  - Needs to watch sodium intake closely.   - BMET/BNP today.    4. Hyperlipidemia: Continue simvastatin.  5. Chest pain: Had some chest  pain during episode of URI versus PNA, no chest pain now.  Pain was nonexertional.  Would hold off on stress testing for now, can arrange for Cardiolite if she has any further chest pain.  She will call if she has any more pain.    Followup in 4 months.   Marca AnconaDalton Ardean Melroy 12/04/2015

## 2015-12-08 ENCOUNTER — Other Ambulatory Visit: Payer: Self-pay | Admitting: Cardiology

## 2015-12-13 ENCOUNTER — Other Ambulatory Visit (HOSPITAL_COMMUNITY): Payer: Self-pay | Admitting: *Deleted

## 2015-12-13 MED ORDER — TORSEMIDE 20 MG PO TABS: 80.0000 mg | ORAL_TABLET | Freq: Two times a day (BID) | ORAL | Status: DC

## 2015-12-19 ENCOUNTER — Ambulatory Visit (INDEPENDENT_AMBULATORY_CARE_PROVIDER_SITE_OTHER): Payer: Medicaid Other

## 2015-12-19 DIAGNOSIS — Z7901 Long term (current) use of anticoagulants: Secondary | ICD-10-CM

## 2015-12-19 DIAGNOSIS — Z954 Presence of other heart-valve replacement: Secondary | ICD-10-CM

## 2015-12-19 DIAGNOSIS — I059 Rheumatic mitral valve disease, unspecified: Secondary | ICD-10-CM | POA: Diagnosis not present

## 2015-12-19 DIAGNOSIS — Z5181 Encounter for therapeutic drug level monitoring: Secondary | ICD-10-CM

## 2015-12-19 DIAGNOSIS — Z952 Presence of prosthetic heart valve: Secondary | ICD-10-CM

## 2015-12-19 LAB — POCT INR: INR: 2.7

## 2016-01-05 ENCOUNTER — Other Ambulatory Visit: Payer: Self-pay | Admitting: Cardiology

## 2016-01-16 ENCOUNTER — Ambulatory Visit (INDEPENDENT_AMBULATORY_CARE_PROVIDER_SITE_OTHER): Payer: Medicaid Other | Admitting: *Deleted

## 2016-01-16 DIAGNOSIS — Z5181 Encounter for therapeutic drug level monitoring: Secondary | ICD-10-CM

## 2016-01-16 DIAGNOSIS — Z952 Presence of prosthetic heart valve: Secondary | ICD-10-CM

## 2016-01-16 DIAGNOSIS — I059 Rheumatic mitral valve disease, unspecified: Secondary | ICD-10-CM

## 2016-01-16 DIAGNOSIS — Z7901 Long term (current) use of anticoagulants: Secondary | ICD-10-CM | POA: Diagnosis not present

## 2016-01-16 DIAGNOSIS — Z954 Presence of other heart-valve replacement: Secondary | ICD-10-CM | POA: Diagnosis not present

## 2016-01-16 LAB — POCT INR: INR: 4

## 2016-01-30 ENCOUNTER — Ambulatory Visit (INDEPENDENT_AMBULATORY_CARE_PROVIDER_SITE_OTHER): Payer: Medicaid Other | Admitting: *Deleted

## 2016-01-30 DIAGNOSIS — I059 Rheumatic mitral valve disease, unspecified: Secondary | ICD-10-CM

## 2016-01-30 DIAGNOSIS — Z7901 Long term (current) use of anticoagulants: Secondary | ICD-10-CM | POA: Diagnosis not present

## 2016-01-30 DIAGNOSIS — Z5181 Encounter for therapeutic drug level monitoring: Secondary | ICD-10-CM

## 2016-01-30 LAB — POCT INR: INR: 3.6

## 2016-02-03 ENCOUNTER — Telehealth (HOSPITAL_COMMUNITY): Payer: Self-pay | Admitting: Surgery

## 2016-02-03 NOTE — Telephone Encounter (Signed)
Patient called asking for a follow-up appt in August.  Appt made on August 14th at 1020 am.

## 2016-02-13 ENCOUNTER — Ambulatory Visit (INDEPENDENT_AMBULATORY_CARE_PROVIDER_SITE_OTHER): Payer: Medicaid Other | Admitting: *Deleted

## 2016-02-13 DIAGNOSIS — I059 Rheumatic mitral valve disease, unspecified: Secondary | ICD-10-CM

## 2016-02-13 DIAGNOSIS — Z7901 Long term (current) use of anticoagulants: Secondary | ICD-10-CM

## 2016-02-13 DIAGNOSIS — Z5181 Encounter for therapeutic drug level monitoring: Secondary | ICD-10-CM

## 2016-02-13 LAB — POCT INR: INR: 3.2

## 2016-02-14 ENCOUNTER — Other Ambulatory Visit: Payer: Self-pay | Admitting: Internal Medicine

## 2016-02-21 ENCOUNTER — Ambulatory Visit (INDEPENDENT_AMBULATORY_CARE_PROVIDER_SITE_OTHER): Payer: Medicaid Other | Admitting: Family Medicine

## 2016-02-21 ENCOUNTER — Encounter: Payer: Self-pay | Admitting: Family Medicine

## 2016-02-21 ENCOUNTER — Ambulatory Visit: Payer: Medicaid Other | Admitting: Family Medicine

## 2016-02-21 VITALS — BP 93/62 | HR 67 | Temp 97.8°F | Ht 59.0 in | Wt 161.8 lb

## 2016-02-21 DIAGNOSIS — E118 Type 2 diabetes mellitus with unspecified complications: Secondary | ICD-10-CM | POA: Diagnosis not present

## 2016-02-21 DIAGNOSIS — Z0000 Encounter for general adult medical examination without abnormal findings: Secondary | ICD-10-CM | POA: Diagnosis present

## 2016-02-21 DIAGNOSIS — H539 Unspecified visual disturbance: Secondary | ICD-10-CM | POA: Insufficient documentation

## 2016-02-21 DIAGNOSIS — Z23 Encounter for immunization: Secondary | ICD-10-CM | POA: Diagnosis not present

## 2016-02-21 LAB — POCT GLYCOSYLATED HEMOGLOBIN (HGB A1C): HEMOGLOBIN A1C: 7

## 2016-02-21 MED ORDER — METFORMIN HCL 500 MG PO TABS: 500.0000 mg | ORAL_TABLET | Freq: Two times a day (BID) | ORAL | Status: DC

## 2016-02-21 NOTE — Patient Instructions (Signed)
Nice to see you again today. Your A1c has increased to 7.0. We will start a medication for your diabetes: Metformin. Take 500 mg twice a day for the next 2 weeks and then increase to 1000mg  twice a day. I would like to see you back in one month. This medication can cause stomach upset and diarrhea but this should get better with time.  I put a referral in for an ophthalmologist with a preference for Dr. Clelia CroftShaw. Someone will call you about this appointment within the next week or so.  Take care, Dr. BLeonard Schwartz

## 2016-02-21 NOTE — Progress Notes (Signed)
   Subjective:   Deborah [name] is a 65 y.o. female with a history of Chronic A. fib, diastolic CHF, chronic anticoagulation, T2 DM, HLD here for diabetes follow-up  T2DM - Checking BG at home: yes, fasting 155-195 - she is concerned that this has been more elevated than previously - Medications: none, previosuly diet controlled (last A1c 08/2015 6.3) - Compliance: n/a - Diet: reports decreasing quantity of carbs and using brown rice instead of white rice - Exercise: walking 30-45 min 3-4 times weekly - She has purposely lost 3 pounds in the last 6 months - eye exam: needs optho referral - believes last was 1-2 yrs ago - foot exam: 08/30/15 - microalbumin: needs - denies symptoms of hypoglycemia, polyuria, polydipsia  Vision changes - got new glasses 1-2 months ago - last saw Ophthalmologist 1-2 years ago - Thinks that her vision has gotten worse in her left eye since she got her new glasses - She denies any eye pain or redness, but occasionally sees floaters and has blurry vision   Review of Systems:  Per HPI.   Social History: never smoker  Objective:  BP 93/62 mmHg  Pulse 67  Temp(Src) 97.8 F (36.6 C) (Oral)  Ht 4\' 11"  (1.499 m)  Wt 161 lb 12.8 oz (73.392 kg)  BMI 32.66 kg/m2  Gen:  65 y.o. female in NAD HEENT: NCAT, MMM, EOMI, PERRL, anicteric sclerae, no painful eye movements CV: RRR, no MRG Resp: Non-labored, CTAB, no wheezes noted Ext: WWP, no edema MSK: No obvious deformities, gait intact Neuro: Alert and oriented, speech normal      Chemistry      Component Value Date/Time   NA 141 12/02/2015 1057   NA 141 08/15/2015 0909   K 4.0 12/02/2015 1057   K 4.2 08/15/2015 0909   CL 104 12/02/2015 1057   CL 108* 02/12/2013 0813   CO2 25 12/02/2015 1057   CO2 27 08/15/2015 0909   BUN 13 12/02/2015 1057   BUN 20.7 08/15/2015 0909   CREATININE 0.98 12/02/2015 1057   CREATININE 0.94 08/30/2015 1429   CREATININE 1.1 08/15/2015 0909      Component Value  Date/Time   CALCIUM 9.4 12/02/2015 1057   CALCIUM 9.3 08/15/2015 0909   ALKPHOS 83 12/02/2015 1057   ALKPHOS 82 08/15/2015 0909   AST 36 12/02/2015 1057   AST 25 08/15/2015 0909   ALT 39 12/02/2015 1057   ALT 23 08/15/2015 0909   BILITOT 1.6* 12/02/2015 1057   BILITOT 1.59* 08/15/2015 0909      Lab Results  Component Value Date   HGBA1C 7.0 02/21/2016   Assessment & Plan:     Deborah [name] is a 65 y.o. female here for   DM (diabetes mellitus) A1c now elevated from 6.3-7.0 Advised on diet and exercise Up-to-date on foot exam Start metformin 500 mg twice a day and then increase to 1000 mg twice a day after 2 weeks Referral to ophthalmology for diabetic eye exam Microalbumin collected today Follow-up in one month  Vision changes Could be related to new glasses or diabetes No red flags today Exam benign Referral to ophthalmology as above ED precautions given including complete loss of vision, pain in eye     Erasmo DownerAngela M Bacigalupo, MD MPH PGY-2,  [facility] 02/21/2016  3:04 PM

## 2016-02-21 NOTE — Assessment & Plan Note (Addendum)
Could be related to new glasses or diabetes No red flags today Exam benign Referral to ophthalmology as above ED precautions given including complete loss of vision, pain in eye

## 2016-02-21 NOTE — Assessment & Plan Note (Signed)
A1c now elevated from 6.3-7.0 Advised on diet and exercise Up-to-date on foot exam Start metformin 500 mg twice a day and then increase to 1000 mg twice a day after 2 weeks Referral to ophthalmology for diabetic eye exam Microalbumin collected today Follow-up in one month

## 2016-02-22 ENCOUNTER — Encounter: Payer: Self-pay | Admitting: Family Medicine

## 2016-02-22 LAB — MICROALBUMIN, URINE: MICROALB UR: 1.1 mg/dL

## 2016-02-27 ENCOUNTER — Ambulatory Visit: Payer: Medicaid Other | Admitting: Family Medicine

## 2016-02-29 ENCOUNTER — Other Ambulatory Visit: Payer: Self-pay | Admitting: Cardiology

## 2016-02-29 ENCOUNTER — Other Ambulatory Visit: Payer: Self-pay | Admitting: Family Medicine

## 2016-02-29 NOTE — Telephone Encounter (Signed)
Please advise 

## 2016-03-12 ENCOUNTER — Ambulatory Visit (INDEPENDENT_AMBULATORY_CARE_PROVIDER_SITE_OTHER): Payer: Medicaid Other | Admitting: *Deleted

## 2016-03-12 DIAGNOSIS — I059 Rheumatic mitral valve disease, unspecified: Secondary | ICD-10-CM | POA: Diagnosis not present

## 2016-03-12 DIAGNOSIS — Z5181 Encounter for therapeutic drug level monitoring: Secondary | ICD-10-CM

## 2016-03-12 DIAGNOSIS — Z7901 Long term (current) use of anticoagulants: Secondary | ICD-10-CM | POA: Diagnosis not present

## 2016-03-12 LAB — POCT INR: INR: 3.7

## 2016-03-15 ENCOUNTER — Telehealth: Payer: Self-pay | Admitting: Family Medicine

## 2016-03-15 NOTE — Telephone Encounter (Signed)
Pt's daughter called and stated [facility] has not received a referral from us. She also said they faxed us on June 28th. The referral is for a diabetic eye exam. Please call pt when referral has been sent. ep

## 2016-03-16 NOTE — Telephone Encounter (Signed)
Py informed fax was re-faxed to [facility]. Deseree Bruna PotterBlount, CMA

## 2016-03-19 ENCOUNTER — Other Ambulatory Visit: Payer: Self-pay | Admitting: Family Medicine

## 2016-04-02 ENCOUNTER — Ambulatory Visit (INDEPENDENT_AMBULATORY_CARE_PROVIDER_SITE_OTHER): Payer: Medicaid Other | Admitting: *Deleted

## 2016-04-02 DIAGNOSIS — Z5181 Encounter for therapeutic drug level monitoring: Secondary | ICD-10-CM | POA: Diagnosis not present

## 2016-04-02 DIAGNOSIS — Z7901 Long term (current) use of anticoagulants: Secondary | ICD-10-CM

## 2016-04-02 DIAGNOSIS — I059 Rheumatic mitral valve disease, unspecified: Secondary | ICD-10-CM | POA: Diagnosis not present

## 2016-04-02 DIAGNOSIS — Z952 Presence of prosthetic heart valve: Secondary | ICD-10-CM

## 2016-04-02 DIAGNOSIS — Z954 Presence of other heart-valve replacement: Secondary | ICD-10-CM | POA: Diagnosis not present

## 2016-04-02 LAB — POCT INR: INR: 3.1

## 2016-04-09 ENCOUNTER — Ambulatory Visit (HOSPITAL_COMMUNITY)
Admission: RE | Admit: 2016-04-09 | Discharge: 2016-04-09 | Disposition: A | Discharge status: Home / Self Care | Payer: Medicaid Other | Source: Ambulatory Visit | Attending: Cardiology | Admitting: Cardiology

## 2016-04-09 ENCOUNTER — Encounter (HOSPITAL_COMMUNITY): Payer: Self-pay

## 2016-04-09 VITALS — BP 120/62 | HR 68 | Wt 160.2 lb

## 2016-04-09 DIAGNOSIS — Z7984 Long term (current) use of oral hypoglycemic drugs: Secondary | ICD-10-CM | POA: Insufficient documentation

## 2016-04-09 DIAGNOSIS — I059 Rheumatic mitral valve disease, unspecified: Secondary | ICD-10-CM | POA: Diagnosis not present

## 2016-04-09 DIAGNOSIS — Z7982 Long term (current) use of aspirin: Secondary | ICD-10-CM | POA: Diagnosis not present

## 2016-04-09 DIAGNOSIS — Z952 Presence of prosthetic heart valve: Secondary | ICD-10-CM | POA: Insufficient documentation

## 2016-04-09 DIAGNOSIS — E785 Hyperlipidemia, unspecified: Secondary | ICD-10-CM | POA: Insufficient documentation

## 2016-04-09 DIAGNOSIS — Z79899 Other long term (current) drug therapy: Secondary | ICD-10-CM | POA: Insufficient documentation

## 2016-04-09 DIAGNOSIS — M109 Gout, unspecified: Secondary | ICD-10-CM | POA: Insufficient documentation

## 2016-04-09 DIAGNOSIS — R7301 Impaired fasting glucose: Secondary | ICD-10-CM | POA: Diagnosis not present

## 2016-04-09 DIAGNOSIS — Z954 Presence of other heart-valve replacement: Secondary | ICD-10-CM | POA: Diagnosis not present

## 2016-04-09 DIAGNOSIS — I130 Hypertensive heart and chronic kidney disease with heart failure and stage 1 through stage 4 chronic kidney disease, or unspecified chronic kidney disease: Secondary | ICD-10-CM | POA: Diagnosis not present

## 2016-04-09 DIAGNOSIS — R0789 Other chest pain: Secondary | ICD-10-CM | POA: Diagnosis not present

## 2016-04-09 DIAGNOSIS — N189 Chronic kidney disease, unspecified: Secondary | ICD-10-CM | POA: Insufficient documentation

## 2016-04-09 DIAGNOSIS — I5032 Chronic diastolic (congestive) heart failure: Secondary | ICD-10-CM | POA: Diagnosis not present

## 2016-04-09 DIAGNOSIS — I482 Chronic atrial fibrillation, unspecified: Secondary | ICD-10-CM

## 2016-04-09 DIAGNOSIS — Z7901 Long term (current) use of anticoagulants: Secondary | ICD-10-CM | POA: Diagnosis not present

## 2016-04-09 LAB — BASIC METABOLIC PANEL
ANION GAP: 9 (ref 5–15)
BUN: 14 mg/dL (ref 6–20)
CHLORIDE: 105 mmol/L (ref 101–111)
CO2: 26 mmol/L (ref 22–32)
Calcium: 9.4 mg/dL (ref 8.9–10.3)
Creatinine, Ser: 0.91 mg/dL (ref 0.44–1.00)
Glucose, Bld: 174 mg/dL — ABNORMAL HIGH (ref 65–99)
POTASSIUM: 3.7 mmol/L (ref 3.5–5.1)
SODIUM: 140 mmol/L (ref 135–145)

## 2016-04-09 LAB — CBC
HEMATOCRIT: 38 % (ref 36.0–46.0)
Hemoglobin: 11.9 g/dL — ABNORMAL LOW (ref 12.0–15.0)
MCH: 23 pg — ABNORMAL LOW (ref 26.0–34.0)
MCHC: 31.3 g/dL (ref 30.0–36.0)
MCV: 73.4 fL — AB (ref 78.0–100.0)
Platelets: 136 10*3/uL — ABNORMAL LOW (ref 150–400)
RBC: 5.18 MIL/uL — AB (ref 3.87–5.11)
RDW: 14.9 % (ref 11.5–15.5)
WBC: 6.1 10*3/uL (ref 4.0–10.5)

## 2016-04-09 MED ORDER — SIMVASTATIN 10 MG PO TABS: 10.0000 mg | ORAL_TABLET | Freq: Every day | ORAL | 6 refills | Status: DC

## 2016-04-09 NOTE — Patient Instructions (Signed)
Routine lab work today. Will notify you of abnormal results  Increase Zocor to 10mg  daily.  Lab work in 1 month (lipids/lfts)  Follow up with Dr.McLean in 3 months

## 2016-04-09 NOTE — Progress Notes (Signed)
Patient ID: Deborah [name], female   DOB: 06/20/1951, 65 y.o.   MRN: [id number] PCP: Dr. Beryle FlockBacigalupo Cardiology: Dr. Shirlee LatchMcLean  65 yo with rheumatic heart disease and MV replacement/TV repair, chronic atrial fibrillation, and chronic diastolic CHF.   Rare chest pain, says Imdur has helped.  Has only had 1 episode in 3 months. Her breathing is ok, no dyspnea walking on flat ground if she walks slowly.  +Dyspnea with walking up stairs. No BRBPR or melena.  Chronically sleeps on 2 pillows.  Weight is down 2 lbs.  ECG (3/17): atrial fibrillation, otherwise normal  Labs (8/12): K 3.5, creatinine 0.98 Labs (10/12): K 3.8, creatinine 1.0, BNP 116 Labs (1/13): K 3.6, creatinine 0.9, LDL 85, HDL 62 Labs (9/13): K 3.9, creatinine 1.0 Labs (4/14): HCT 37.8 with elevated eosinophils, BNP 1094 => 82, K 3.6, creatinine 1.0 Labs (9/14): K 3.6, creatinine 0.89, hemoglobin 11 Labs (1/15): K 3.7, creatinine 0.9, HCT 36.9 Labs (2/15): LDL 99, HDL 50 Labs (3/15): K 4, creatinine 1.28 Labs (6/15): K 3.7, creatinine 1.0, BNP 124 => 80 Labs (02/22/14): K 3.5 Creatinine 0.9 Labs (10/15): K 3.3, creatinine 0.94, HCT 38.9 Labs (6/16): K 3.9, creatinine 1.1, HCT 36.4 Labs (9/16): K 3.9, creatinine 1.61, HCT 38.5 Labs (12/16): HCT 39.4, K 4.2, creatinine 1.1 Labs (1/17): K 4.2, creatinine 1.01, BNP 109, LDL 120, HDL 70 Labs (3/17): K 3.9, creatinine 1.02 Labs (4/17): K 4, creatinine 0.98, BNP 83  PMH: 1. Rheumatic heart disease: Status post [facility] MV [facility].  Echo 5/12 with EF 60-65%, mild AI, mechanical MV prosthesis with normal function, severe LAE, moderate RAE, moderate to severe TR, mildly elevated PA systolic pressure.  Echo (6/15) EF 55-60%, normal RV, normal mechanical mitral valve, severe LAE, moderate TR.  Echo (9/16) with EF 55-60%, mild to moderate AI, mechanical mitral valve looks ok, s/p TV repair with mild TR, PASP 40.  2. Chronic atrial fibrillation on  coumadin.  Event monitor (7/12-8/12) showed no high rate episodes (patient continuously in atrial fibrillation).  3. Chronic diastolic CHF 4. ETT-myoview (7/12): No evidence for ischemia or infarction.  5. Impaired fasting glucose 6. Hyperlipidemia  7. HTN 8. Gout 9. Anemia 10. External hemorrhoids.  11. 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  13. CKD  SH: Nonsmoker. [place] in [place]   FH: No premature CAD  ROS: All systems reviewed and negative except as noted in HPI.    Current Outpatient Prescriptions  Medication Sig Dispense Refill  . acetaminophen (TYLENOL) 325 MG tablet Take 650 mg by mouth every 6 (six) hours as needed for pain.     Marland Kitchen. allopurinol (ZYLOPRIM) 300 MG tablet TAKE ONE TABLET BY MOUTH EVERY DAY 30 tablet 3  . aspirin EC 81 MG tablet Take 81 mg by mouth daily.    . cyanocobalamin 500 MCG tablet Take 1 tablet (500 mcg total) by mouth daily. 30 tablet 5  . diazepam (VALIUM) 5 MG tablet TAKE 1 TABLET BY MOUTH EVERY 8 HOURS AS NEEDED FOR ANXIETY    . diclofenac sodium (VOLTAREN) 1 % GEL Apply 2 g topically 4 (four) times daily. 100 g 3  . folic acid (FOLVITE) 1 MG tablet TAKE ONE TABLET BY MOUTH EVERY DAY 30 tablet 3  . glucose blood test strip Use as instructed to test blood glucose BID 100 each 12  . isosorbide mononitrate (IMDUR) 30 MG 24 hr tablet Take 30  mg by mouth daily.    Marland Kitchen. loratadine (CLARITIN) 10 MG tablet Take 10 mg by mouth daily.    Marland Kitchen. loteprednol (LOTEMAX) 0.2 % SUSP 1 drop 4 (four) times daily.    . metFORMIN (GLUCOPHAGE) 500 MG tablet Take 2 tablets (1,000 mg total) by mouth 2 (two) times daily with a meal. 120 tablet 3  . metoprolol succinate (TOPROL-XL) 50 MG 24 hr tablet TAKE ONE TABLET BY MOUTH EVERY DAY 45 tablet 3  . olopatadine (PATANOL) 0.1 % ophthalmic solution Place 1 drop into both eyes 2 (two) times daily.    Marland Kitchen. omeprazole (PRILOSEC) 20 MG capsule TAKE ONE CAPSULE BY MOUTH  EVERY DAY 30 capsule 3  . potassium chloride SA (K-DUR,KLOR-CON) 20 MEQ tablet TAKE THREE TABLETS BY MOUTH TWICE DAILY 240 tablet 3  . simvastatin (ZOCOR) 10 MG tablet Take 1 tablet (10 mg total) by mouth daily. 30 tablet 6  . torsemide (DEMADEX) 20 MG tablet Take 4 tablets (80 mg total) by mouth 2 (two) times daily. 240 tablet 3  . warfarin (COUMADIN) 2 MG tablet TAKE ONE TABLET EVERY DAY EXCEPT ONE-HALF TABLET ON MONDAY AND WEDNESDAY OR AS DIRECTED by coumadin clinic 80 tablet 3  . HYDROcodone-homatropine (HYCODAN) 5-1.5 MG/5ML syrup Take 5 mLs by mouth every 6 (six) hours as needed for cough. (Patient not taking: Reported on 04/09/2016) 60 mL 0  . meclizine (ANTIVERT) 25 MG tablet Take 1 tablet (25 mg total) by mouth 3 (three) times daily as needed for dizziness. (Patient not taking: Reported on 04/09/2016) 30 tablet 0  . nitroGLYCERIN (NITROSTAT) 0.4 MG SL tablet Place 0.4 mg under the tongue every 5 (five) minutes as needed for chest pain.     No current facility-administered medications for this encounter.     BP 120/62   Pulse 68   Wt 160 lb 4 oz (72.7 kg)   SpO2 99%   BMI 32.37 kg/m  General: NAD Neck: JVP 7 cm, no thyromegaly or thyroid nodule.  Lungs: Clear to auscultation bilaterally with normal respiratory effort. CV: Nondisplaced PMI.  Heart irregular S1/S2, no S3/S4, mechanical S1, 2/6 HSM LLSB.  No peripheral edema.  No carotid bruit.  Normal pedal pulses.  Abdomen: Soft, nontender, no hepatosplenomegaly, no distention.  Neurologic: Alert and oriented x 3.  Psych: Normal affect. Extremities: No clubbing or cyanosis.   Assessment/Plan: 1. Atrial fibrillation: Chronic.  Continue warfarin.  Rate is controlled.    2. Mechanical mitral valve: Continue ASA/coumadin, INR goal 2.5-3.5.  Will need bridging with heparin/Lovenox if has to come off coumadin.  Check CBC today.  3. Diastolic CHF:  Volume status appears ok today.  NYHA class II symptoms.  Stable echo in 9/16.  - Continue  torsemide 80 mg bid.  - Needs to watch sodium intake closely.   - BMET today.    4. Hyperlipidemia: Mild elevation in LDL in 1/17.  She has only been taking simvastatin every other day.  I asked her to try to increase this to daily rather than every other day.  Lipids/LFTs in 2 months.  5. Chest pain: Rare, atypical chest pain.  Would hold off on stress testing for now, can arrange for Cardiolite if needed in future.  Imdur helped her chest pain, ?microvascular angina.    Followup in 3-4 months.   Deborah [name] 04/09/2016

## 2016-04-10 ENCOUNTER — Other Ambulatory Visit: Payer: Self-pay | Admitting: Family Medicine

## 2016-04-10 ENCOUNTER — Other Ambulatory Visit (HOSPITAL_COMMUNITY): Payer: Self-pay | Admitting: Internal Medicine

## 2016-04-10 NOTE — Telephone Encounter (Signed)
Medication refilled

## 2016-04-12 ENCOUNTER — Encounter (HOSPITAL_COMMUNITY): Payer: Self-pay | Admitting: Emergency Medicine

## 2016-04-12 ENCOUNTER — Observation Stay (HOSPITAL_COMMUNITY)
Admission: EM | Admit: 2016-04-12 | Discharge: 2016-04-14 | Disposition: A | Discharge status: Home / Self Care | Payer: Medicaid Other | Attending: Family Medicine | Admitting: Family Medicine

## 2016-04-12 ENCOUNTER — Emergency Department (HOSPITAL_COMMUNITY): Payer: Medicaid Other

## 2016-04-12 ENCOUNTER — Other Ambulatory Visit: Payer: Self-pay

## 2016-04-12 DIAGNOSIS — I5032 Chronic diastolic (congestive) heart failure: Secondary | ICD-10-CM | POA: Diagnosis not present

## 2016-04-12 DIAGNOSIS — Z7901 Long term (current) use of anticoagulants: Secondary | ICD-10-CM | POA: Diagnosis not present

## 2016-04-12 DIAGNOSIS — Z7984 Long term (current) use of oral hypoglycemic drugs: Secondary | ICD-10-CM | POA: Insufficient documentation

## 2016-04-12 DIAGNOSIS — R079 Chest pain, unspecified: Secondary | ICD-10-CM | POA: Diagnosis present

## 2016-04-12 DIAGNOSIS — E1165 Type 2 diabetes mellitus with hyperglycemia: Secondary | ICD-10-CM | POA: Diagnosis not present

## 2016-04-12 DIAGNOSIS — I130 Hypertensive heart and chronic kidney disease with heart failure and stage 1 through stage 4 chronic kidney disease, or unspecified chronic kidney disease: Secondary | ICD-10-CM | POA: Insufficient documentation

## 2016-04-12 DIAGNOSIS — A059 Bacterial foodborne intoxication, unspecified: Secondary | ICD-10-CM | POA: Insufficient documentation

## 2016-04-12 DIAGNOSIS — E860 Dehydration: Secondary | ICD-10-CM | POA: Diagnosis present

## 2016-04-12 DIAGNOSIS — E785 Hyperlipidemia, unspecified: Secondary | ICD-10-CM | POA: Diagnosis not present

## 2016-04-12 DIAGNOSIS — E1122 Type 2 diabetes mellitus with diabetic chronic kidney disease: Secondary | ICD-10-CM | POA: Insufficient documentation

## 2016-04-12 DIAGNOSIS — I482 Chronic atrial fibrillation, unspecified: Secondary | ICD-10-CM | POA: Diagnosis present

## 2016-04-12 DIAGNOSIS — I081 Rheumatic disorders of both mitral and tricuspid valves: Secondary | ICD-10-CM | POA: Diagnosis present

## 2016-04-12 DIAGNOSIS — M109 Gout, unspecified: Secondary | ICD-10-CM | POA: Insufficient documentation

## 2016-04-12 DIAGNOSIS — Z7982 Long term (current) use of aspirin: Secondary | ICD-10-CM | POA: Insufficient documentation

## 2016-04-12 DIAGNOSIS — Z952 Presence of prosthetic heart valve: Secondary | ICD-10-CM | POA: Diagnosis not present

## 2016-04-12 DIAGNOSIS — I209 Angina pectoris, unspecified: Secondary | ICD-10-CM

## 2016-04-12 LAB — I-STAT TROPONIN, ED
TROPONIN I, POC: 0.01 ng/mL (ref 0.00–0.08)
Troponin i, poc: 0.01 ng/mL (ref 0.00–0.08)

## 2016-04-12 LAB — BASIC METABOLIC PANEL
Anion gap: 9 (ref 5–15)
BUN: 12 mg/dL (ref 6–20)
CHLORIDE: 102 mmol/L (ref 101–111)
CO2: 28 mmol/L (ref 22–32)
Calcium: 9.3 mg/dL (ref 8.9–10.3)
Creatinine, Ser: 1.06 mg/dL — ABNORMAL HIGH (ref 0.44–1.00)
GFR calc Af Amer: >60 mL/min (ref >60)
GFR calc non Af Amer: 55 mL/min — ABNORMAL LOW (ref >60)
Glucose, Bld: 128 mg/dL — ABNORMAL HIGH (ref 65–99)
POTASSIUM: 3.4 mmol/L — AB (ref 3.5–5.1)
SODIUM: 139 mmol/L (ref 135–145)

## 2016-04-12 LAB — I-STAT CG4 LACTIC ACID, ED
LACTIC ACID, VENOUS: 2.08 mmol/L — AB (ref 0.5–1.9)
Lactic Acid, Venous: 1.74 mmol/L (ref 0.5–1.9)

## 2016-04-12 LAB — PROTIME-INR
INR: 2.7
Prothrombin Time: 29.2 seconds — ABNORMAL HIGH (ref 11.4–15.2)

## 2016-04-12 LAB — CBC
HEMATOCRIT: 39.1 % (ref 36.0–46.0)
Hemoglobin: 12.3 g/dL (ref 12.0–15.0)
MCH: 23.3 pg — ABNORMAL LOW (ref 26.0–34.0)
MCHC: 31.5 g/dL (ref 30.0–36.0)
MCV: 74.2 fL — AB (ref 78.0–100.0)
Platelets: 152 10*3/uL (ref 150–400)
RBC: 5.27 MIL/uL — AB (ref 3.87–5.11)
RDW: 15.1 % (ref 11.5–15.5)
WBC: 12.7 10*3/uL — AB (ref 4.0–10.5)

## 2016-04-12 LAB — GLUCOSE, CAPILLARY: Glucose-Capillary: 132 mg/dL — ABNORMAL HIGH (ref 65–99)

## 2016-04-12 LAB — POC OCCULT BLOOD, ED: Fecal Occult Bld: NEGATIVE

## 2016-04-12 MED ORDER — ALLOPURINOL 300 MG PO TABS
300.0000 mg | ORAL_TABLET | Freq: Every day | ORAL | Status: DC
  Administered 2016-04-13 – 2016-04-14 (×2): 300 mg via ORAL
  Filled 2016-04-12 (×2): qty 1

## 2016-04-12 MED ORDER — METOPROLOL SUCCINATE ER 50 MG PO TB24
50.0000 mg | ORAL_TABLET | Freq: Every day | ORAL | Status: DC
  Filled 2016-04-12: qty 1

## 2016-04-12 MED ORDER — SIMVASTATIN 10 MG PO TABS
10.0000 mg | ORAL_TABLET | Freq: Every day | ORAL | Status: DC
  Administered 2016-04-13 – 2016-04-14 (×2): 10 mg via ORAL
  Filled 2016-04-12 (×2): qty 1

## 2016-04-12 MED ORDER — METOPROLOL TARTRATE 5 MG/5ML IV SOLN
5.0000 mg | Freq: Once | INTRAVENOUS | Status: AC
  Administered 2016-04-12: 5 mg via INTRAVENOUS
  Filled 2016-04-12: qty 5

## 2016-04-12 MED ORDER — SODIUM CHLORIDE 0.9 % IV BOLUS (SEPSIS)
500.0000 mL | Freq: Once | INTRAVENOUS | Status: AC
  Administered 2016-04-12: 500 mL via INTRAVENOUS

## 2016-04-12 MED ORDER — PANTOPRAZOLE SODIUM 40 MG PO TBEC
40.0000 mg | DELAYED_RELEASE_TABLET | Freq: Every day | ORAL | Status: DC
  Administered 2016-04-13 – 2016-04-14 (×2): 40 mg via ORAL
  Filled 2016-04-12 (×2): qty 1

## 2016-04-12 MED ORDER — GI COCKTAIL ~~LOC~~: 30.0000 mL | Freq: Four times a day (QID) | ORAL | Status: DC | PRN

## 2016-04-12 MED ORDER — ASPIRIN EC 81 MG PO TBEC
81.0000 mg | DELAYED_RELEASE_TABLET | Freq: Every day | ORAL | Status: DC
Start: 2016-04-13 — End: 2016-04-14
  Administered 2016-04-13 – 2016-04-14 (×2): 81 mg via ORAL
  Filled 2016-04-12 (×2): qty 1

## 2016-04-12 MED ORDER — ONDANSETRON HCL 4 MG/2ML IJ SOLN: 4.0000 mg | Freq: Four times a day (QID) | INTRAMUSCULAR | Status: DC | PRN

## 2016-04-12 MED ORDER — ISOSORBIDE MONONITRATE ER 30 MG PO TB24
30.0000 mg | ORAL_TABLET | Freq: Every day | ORAL | Status: DC
  Administered 2016-04-13: 30 mg via ORAL
  Filled 2016-04-12: qty 1

## 2016-04-12 MED ORDER — ACETAMINOPHEN 325 MG PO TABS: 650.0000 mg | ORAL_TABLET | ORAL | Status: DC | PRN

## 2016-04-12 MED ORDER — INSULIN ASPART 100 UNIT/ML ~~LOC~~ SOLN
0.0000 [IU] | Freq: Three times a day (TID) | SUBCUTANEOUS | Status: DC
  Administered 2016-04-13: 1 [IU] via SUBCUTANEOUS
  Administered 2016-04-13 – 2016-04-14 (×3): 2 [IU] via SUBCUTANEOUS

## 2016-04-12 MED ORDER — DIAZEPAM 5 MG PO TABS: 5.0000 mg | ORAL_TABLET | Freq: Three times a day (TID) | ORAL | Status: DC | PRN

## 2016-04-12 MED ORDER — INSULIN ASPART 100 UNIT/ML ~~LOC~~ SOLN: 0.0000 [IU] | Freq: Every day | SUBCUTANEOUS | Status: DC

## 2016-04-12 MED ORDER — TORSEMIDE 20 MG PO TABS
80.0000 mg | ORAL_TABLET | Freq: Two times a day (BID) | ORAL | Status: DC
  Administered 2016-04-13 – 2016-04-14 (×3): 80 mg via ORAL
  Filled 2016-04-12 (×3): qty 4

## 2016-04-12 NOTE — ED Notes (Signed)
Admitting MD at the bedside.  

## 2016-04-12 NOTE — ED Triage Notes (Signed)
Pt states when she woke up this am she has a few episodes of diarrhea and lower abd cramping. Pt states around 2pm she started feeling "bad" and having a pressure like pain in the left side of her chest. Pt is shivering in triage and states she is cold. Pt reports taking 500mg  of tylenol PTA. Oral temp 99.1

## 2016-04-12 NOTE — ED Provider Notes (Signed)
Emergency Department Provider Note   I have reviewed the triage vital signs and the nursing notes.   HISTORY  Chief Complaint Chest Pain and Abdominal Pain   HPI Deborah [name] is a 65 y.o. female with PMH of a-fib on coumadin, CHF, HTN, HLD presents to the emergency department for evaluation of intermittent nausea, diffuse abdominal discomfort, multiple episodes of diarrhea and new onset chest discomfort. The patient began feeling ill last night with nausea and all throughout the day has developed multiple episodes of diarrhea. The patient's friend estimates 11-12 episodes of diarrhea. During one of the episodes she thought she appreciated some bright red blood. No black or melena stools. He should has had subjective fever but highest recorded was 99 Fahrenheit. Patient denies any worsening shortness of breath above her baseline. Patient has been compliant with her Coumadin and reports her INR was normal 2 days ago. No exacerbating or alleviating factors noted for the patient's abdominal discomfort or chest pain.   Past Medical History:  Diagnosis Date  . Allergic rhinitis   . Anemia   . Borderline diabetes   . Carpal tunnel syndrome   . Chronic atrial fibrillation (HCC)    [facility];  Event montor (7/12-8/12) showed no high rate episodes (patient continuously in atrial fibrillation).   . Chronic diastolic heart failure (HCC)   . Eosinophilia 01/27/2013  . External hemorrhoid   . External hemorrhoids   . Fatigue   . Gout   . 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 Gaylyn RongHa  . Hx of cardiovascular stress test    ETT-myoview (7/12): No evidence for ischemia or infarction  . Hyperlipidemia   . Hypertension   . Obesity   . Rheumatic heart disease    Status post mechanical ([employer]) mitral valve replacement and tricuspid valve repair at [facility]; echo 5/12:   EF 60-65%, mild AI, mitral valve  prosthesis with AVA 1.66, severe LAE, moderate RAE, moderate to severe TR, mild increased pulmonary artery systolic pressure;    Adenosine Cardiolite in 4/09:   No ischemia, EF 66%  . Rotator cuff syndrome     Patient Active Problem List   Diagnosis Date Noted  . Vision changes 02/21/2016  . Viral URI 08/30/2015  . Abdominal pain, right upper quadrant 03/24/2015  . 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 Acute bronchitis 09/09/2014  . Bacterial upper respiratory infection 06/30/2014  . Right shoulder pain 06/02/2014  . Chest pain 02/24/2014  . Encounter for therapeutic drug monitoring 09/30/2013  . 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 H/O prosthetic heart valve 07/08/2013  . Eosinophilia 01/27/2013  . CAP (community acquired pneumonia) 12/12/2012  . Hypotension 12/12/2012  . Microcytic anemia 05/01/2012  . 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 Postprocedural state 05/22/2011  . Diastolic CHF, chronic (HCC) 03/15/2011  . Rheumatic heart disease   . Chronic atrial fibrillation (HCC)   . Chronic anticoagulation   . DM (diabetes mellitus) (HCC)   . Hyperlipidemia   . Mitral valve disorder 11/16/2010  . GOUT 11/29/2009  . TRICUSPID REGURGITATION, MODERATE WITH MILD PULMONARY HTN 11/29/2009  . HYPERBILIRUBINEMIA 06/22/2009  . HYPERGLYCEMIA 06/21/2009  . Hyperlipidemia associated with type 2 diabetes mellitus (HCC) 01/07/2009  . GERD 01/07/2009  . CARPAL TUNNEL SYNDROME, LEFT 11/11/2008    Past Surgical History:  Procedure Laterality Date  . CARDIAC CATHETERIZATION     EF  55%  . TRICUSPID VALVE REPAIR      Current Outpatient Rx  . Order #: [id number] Class: Normal  . Order #: [id number] Class: Historical Med  . Order #: [id number] Class: Normal  . Order #: [id number] Class: Normal  . Order #: [id number] Class: Historical Med  . Order #: [id number] Class: Normal  . Order #: [id number] Class: Historical Med  . Order #: [id number] Class: Normal  . Order #:  [id number] Class: Normal  . Order #: [id number] Class: Print  . Order #: [id number] Class: Historical Med  . Order #: [id number] Class: Historical Med  . Order #: [id number] Class: Historical Med  . Order #: [id number] Class: Normal  . Order #: [id number] Class: Normal  . Order #: [id number] Class: Normal  . Order #: [id number] Class: Historical Med  . Order #: [id number] Class: Historical Med  . Order #: [id number] Class: Normal  . Order #: [id number] Class: Normal  . Order #: [id number] Class: Normal  . Order #: [id number] Class: Normal  . Order #: [id number] Class: Normal    Allergies Promethazine hcl  Family History  Problem Relation Age of Onset  . Stroke Mother   . Diabetes Mother   . Heart failure Mother     pacemaker  . Heart disease Father     "heart problems"  . Heart disease Sister   . Heart disease Brother   . Gout Brother   . Diabetes Brother   . Stroke Brother   . Valvular heart disease Sister     Social History Social History  Substance Use Topics  . Smoking status: Never Smoker  . Smokeless tobacco: Never Used  . Alcohol use No    Review of Systems  Constitutional: No fever/chills Eyes: No visual changes. ENT: No sore throat. Cardiovascular: Positive chest pain. Respiratory: Denies shortness of breath. Gastrointestinal: Positive diffuse abdominal pain. Positive nausea, no vomiting.  Positive diarrhea.  No constipation. Genitourinary: Negative for dysuria. Musculoskeletal: Negative for back pain. Skin: Negative for rash. Neurological: Negative for headaches, focal weakness or numbness.  10-point ROS otherwise negative.  ____________________________________________   PHYSICAL EXAM:  VITAL SIGNS: ED Triage Vitals  Enc Vitals Group     BP 04/12/16 1606 (!) 114/42     Pulse Rate 04/12/16 1606 101     Resp 04/12/16 1606 18     Temp 04/12/16 1606 99.1 F (37.3 C)     Temp Source 04/12/16 1606 Oral     SpO2 04/12/16 1606 92 %     Weight 04/12/16 1606 160 lb (72.6  kg)     Height 04/12/16 1606 4\' 11"  (1.499 m)     Pain Score 04/12/16 1607 6   Constitutional: Alert and oriented. Well appearing and in no acute distress. Eyes: Conjunctivae are normal. PERRL. EOMI. Head: Atraumatic. Nose: No congestion/rhinnorhea. Mouth/Throat: Mucous membranes are dry.  Oropharynx non-erythematous. Neck: No stridor.   Cardiovascular: A-fib with RVR. Good peripheral circulation. Grossly normal heart sounds.   Respiratory: Normal respiratory effort.  No retractions. Lungs CTAB. Gastrointestinal: Soft and nontender. No distention.  Musculoskeletal: No lower extremity tenderness nor edema. No gross deformities of extremities. Neurologic:  Normal speech and language. No gross focal neurologic deficits are appreciated.  Skin:  Skin is warm, dry and intact. No rash noted. Psychiatric: Mood and affect are normal. Speech and behavior are normal.  ____________________________________________   LABS (all labs ordered are listed, but only abnormal results are displayed)  Labs Reviewed  BASIC METABOLIC PANEL - Abnormal; Notable for the following:       Result Value   Potassium 3.4 (*)    Glucose,  Bld 128 (*)    Creatinine, Ser 1.06 (*)    GFR calc non Af Amer 55 (*)    All other components within normal limits  CBC - Abnormal; Notable for the following:    WBC 12.7 (*)    RBC 5.27 (*)    MCV 74.2 (*)    MCH 23.3 (*)    All other components within normal limits  I-STAT CG4 LACTIC ACID, ED - Abnormal; Notable for the following:    Lactic Acid, Venous 2.08 (*)    All other components within normal limits  CULTURE, BLOOD (ROUTINE X 2)  CULTURE, BLOOD (ROUTINE X 2)  PROTIME-INR  I-STAT TROPOININ, ED  POC OCCULT BLOOD, ED  I-STAT CG4 LACTIC ACID, ED  I-STAT TROPOININ, ED   ____________________________________________  EKG  Reviewed in MUSE. No STEMI.  ____________________________________________  RADIOLOGY  Dg Chest 2 View  Result Date: 04/12/2016 CLINICAL  DATA:  Mucus and blood in diarrhea, fever, chest tightness, shortness of breath and rapid heart rate today. History of valve replacement and diabetes. EXAM: CHEST  2 VIEW COMPARISON:  Chest x-ray dated 11/21/2015. FINDINGS: Cardiomegaly is stable. Overall configuration of the cardiomediastinal silhouette is stable. Median sternotomy wires appear intact and stable in alignment. Lungs are clear. Lung volumes are normal. No pleural effusion or pneumothorax seen. No acute or suspicious osseous finding. IMPRESSION: Stable cardiomegaly.  No acute findings Electronically Signed   By: Bary RichardStan  Maynard M.D.   On: 04/12/2016 17:04    ____________________________________________   PROCEDURES  Procedure(s) performed:   Procedures  None ____________________________________________   INITIAL IMPRESSION / ASSESSMENT AND PLAN / ED COURSE  Pertinent labs & imaging results that were available during my care of the patient were reviewed by me and considered in my medical decision making (see chart for details).  Patient presents to the ED for evaluation of multiple episodes of diarrhea and resulting dehydration. No SOB but patient believes that she saw BRBPR during a BM today. Patient on Coumadin for a-fib. Also with history of heart failure. A-fib RVR on monitor and EKG. Will give small IVF bolus with history of CHF and metoprolol IV for rate control. Patient also developed CP while waiting in the ED waiting room. Currently CP-free. Low PE risk on Coumadin. Low suspicion for ACS but will obtain troponin and reassess.   Labs with slight leukocytosis. Troponin negative. INR is 2.7. Plan for admission for slow IV fluid hydration and biomarker trending. Discussed with admission team and placed temporary admit orders. Discussed with patient and family at bedside who are pleased. No CP at this time.   Reviewed all labs, EKG, and imaging.    ____________________________________________  FINAL CLINICAL  IMPRESSION(S) / ED DIAGNOSES  Final diagnoses:  Dehydration  Nonspecific chest pain     MEDICATIONS GIVEN DURING THIS VISIT:  Medications  sodium chloride 0.9 % bolus 500 mL (not administered)  metoprolol (LOPRESSOR) injection 5 mg (not administered)     NEW OUTPATIENT MEDICATIONS STARTED DURING THIS VISIT:  None   Note:  This document was prepared using Dragon voice recognition software and may include unintentional dictation errors.  Alona BeneJoshua Long, MD Emergency Medicine   Maia PlanJoshua G Long, MD 04/13/16 [id number]

## 2016-04-12 NOTE — ED Notes (Signed)
Pt changing into a gown

## 2016-04-12 NOTE — H&P (Signed)
[facility] History and Physical Service Pager: [phone]  Patient name: Judge [facility]Krim [name] Medical record number: [id number] Date of birth: 03/03/1951 Age: 65 y.o. Gender: female  Primary Care Provider: Shirlee LatchAngela Bacigalupo, MD Consultants: None Code Status: Full  Chief Complaint: Chest pain/diarrhea  Assessment and Plan: Deborah BayKrim [name] Dail is a 65 y.o. female presenting with chest pain after developing likely dehydration after diarrhea . PMH is significant for chronic Afib, mechanical heart valve (on warfarin), diastolic HF, gout, DM, Rheumatic heart disease  ACS rule out- Chest pain in the setting of likely dehydration with and afib with RvR likely secondary to demand ischemia. HEART score 3. Pain resolving with normalization of heart rate and IV fluids, however given extensive cardiac history there is concern for cardiac pathology. tropnin neg in the Ed x2. EKG without ischemic pattern. Last seen by HF on 8/14 at which time there was discussion of stress testing for occasional atypical chest pain. [facility] normal - admit to telemetry attending Dr Pollie MeyerMcIntyre - trend troponins - EKG in the AM - aspirin - nitro SL, morphine PRN chest pain - GI cocktail - consider cards consult in AM   Diarrhea/ with question rectal bleeding- diarrhea has since resolved. Diarrhea likely due to food borne illness given association of illness of others with same meal. No evidence of bleeding on exam with negative FOBT in the ED. Unclear if she actually had blood in her stool given negative testing. Given her presentation with chest pain and her extensive clot risk ( afib, mechanical heart valve) the risks of holding anticoagulation may out weigh the benefits. Hemoglobin 12.3, stable from last check 11.9 on 8/14 - stools studies if continues to have diarrhea - hold anticoagulation if develops bleeding - zofran PRN nausea  Dehydration- Concern for dehydration  with flud losses from diarrhea. Creatinine 1.09 with baseline approx 0.9-1 - follow renal studies - s/p 500 ml bolus in the ED.  - gentle fluids in setting of cardiac disease, NS 50 cc/hr x12 hours - encourage PO intake  Diastolic HF- No evidence of volume overload. Last echo 04/2015 EF 55-60% - continue home torsemide in AM - continue home Imdur, metoprolol  Afib with mechanical mitral valve in place- INR 2.7 on admission. RvR resolved in the ED with IV metoprolol - continue home warfarin, metroprol  Type 2 Diabetes- A1c 7.0 on 01/2016 - SSI ACHS  Gout- last uric acid 4.7 04/2013 -  Continue home allopurinol  FEN/GI: carb/heart healthy diet, NS 50 cc/hr,  Prophylaxis: warfarin  Disposition: Admit to teaching service, attending Dr Pollie MeyerMcIntyre  History of Present Illness:  Deborah [name] is a [age] y.o. female presenting to the ED for diarrhea with lightheadedness. In the ED her diarrhea stopped but she developed squeezing chest discomfort while in the waiting room  When she awoke this AM she started to have watery diarrhea, no bright red blood or melena at that time. She continued to have diarrhea throughout the AM with some dizziness so she went to the ED for evaluation. She had no nausea or emesis. She has had no fevers.  In the ED her diarrhea began to slow such that she had only mucous-like output but she then noted small amount of what appeared to be blood in the toilet bowel. This occurred twice in the ED. Since being roomed, she has not had any more diarrhea. She reports cramping abdominal pain during the day but this has since resolved. Of note,  she was at a church function yesterday night, 8/16, at which time they had food. Today multiple people who ate the provided food developed diarrhea While in the ED she developed afib with RvR and subsequently squeezing left sided chest pain. No radiation of the pain. She had some shortness of breath when her heart was beating fast. She was given  IV metoprolol which resolved her afib with RvR and her chest pain and shortness of breath She has been able to tolerate some PO today and late ate this AM prior to coming to the ED.   Review Of Systems: Per HPI with the following additions: Otherwise she denies other recent illness, headache, weakness Otherwise the remainder of the systems were negative.  Patient Active Problem List   Diagnosis Date Noted  . Dehydration 04/12/2016  . Vision changes 02/21/2016  . Viral URI 08/30/2015  . Abdominal pain, right upper quadrant 03/24/2015  . 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 Acute bronchitis 09/09/2014  . Bacterial upper respiratory infection 06/30/2014  . Right shoulder pain 06/02/2014  . Chest pain 02/24/2014  . Encounter for therapeutic drug monitoring 09/30/2013  . 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 H/O prosthetic heart valve 07/08/2013  . Eosinophilia 01/27/2013  . CAP (community acquired pneumonia) 12/12/2012  . Hypotension 12/12/2012  . Microcytic anemia 05/01/2012  . 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 Postprocedural state 05/22/2011  . Diastolic CHF, chronic (HCC) 03/15/2011  . Rheumatic heart disease   . Chronic atrial fibrillation (HCC)   . Chronic anticoagulation   . DM (diabetes mellitus) (HCC)   . Hyperlipidemia   . Mitral valve disorder 11/16/2010  . GOUT 11/29/2009  . TRICUSPID REGURGITATION, MODERATE WITH MILD PULMONARY HTN 11/29/2009  . HYPERBILIRUBINEMIA 06/22/2009  . HYPERGLYCEMIA 06/21/2009  . Hyperlipidemia associated with type 2 diabetes mellitus (HCC) 01/07/2009  . GERD 01/07/2009  . CARPAL TUNNEL SYNDROME, LEFT 11/11/2008    Past Medical History: Past Medical History:  Diagnosis Date  . Allergic rhinitis   . Anemia   . Borderline diabetes   . Carpal tunnel syndrome   . Chronic atrial fibrillation (HCC)    [facility];  Event montor (7/12-8/12) showed no high rate episodes (patient  continuously in atrial fibrillation).   . Chronic diastolic heart failure (HCC)   . Eosinophilia 01/27/2013  . External hemorrhoid   . External hemorrhoids   . Fatigue   . Gout   . 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 Gaylyn RongHa  . Hx of cardiovascular stress test    ETT-myoview (7/12): No evidence for ischemia or infarction  . Hyperlipidemia   . Hypertension   . Obesity   . Rheumatic heart disease    Status post mechanical (St. Jude) mitral valve replacement and [facility]; echo 5/12:   EF 60-65%, mild AI, mitral valve prosthesis with AVA 1.66, severe LAE, moderate RAE, moderate to severe TR, mild increased pulmonary artery systolic pressure;    Adenosine Cardiolite in 4/09:   No ischemia, EF 66%  . Rotator cuff syndrome     Past Surgical History: Past Surgical History:  Procedure Laterality Date  . CARDIAC CATHETERIZATION     EF 55%  . TRICUSPID VALVE REPAIR      Social History: Social History  Substance Use Topics  . Smoking status: Never Smoker  . Smokeless tobacco: Never Used  . Alcohol use  No   Additional social history: denies drug use, etoh, smoking Please also refer to relevant sections of EMR.  Family History: Family History  Problem Relation Age of Onset  . Stroke Mother   . Diabetes Mother   . Heart failure Mother     pacemaker  . Heart disease Father     "heart problems"  . Heart disease Sister   . Heart disease Brother   . Gout Brother   . Diabetes Brother   . Stroke Brother   . Valvular heart disease Sister     Allergies and Medications: Allergies  Allergen Reactions  . Promethazine Hcl     REACTION: made her real shaky   No current facility-administered medications on file prior to encounter.    Current Outpatient Prescriptions on File Prior to Encounter  Medication Sig Dispense Refill  . ACCU-CHEK AVIVA PLUS test strip USE AS INSTRUCTED TO TEST BLOOD GLUCOSE  TWICE A DAY 100 each 12  . acetaminophen (TYLENOL) 325 MG tablet Take 650 mg by mouth every 6 (six) hours as needed for pain.     Marland Kitchen allopurinol (ZYLOPRIM) 300 MG tablet TAKE ONE TABLET BY MOUTH EVERY DAY 30 tablet 3  . allopurinol (ZYLOPRIM) 300 MG tablet TAKE 1 TABLET (300 MG TOTAL) BY MOUTH DAILY. 30 tablet 3  . aspirin EC 81 MG tablet Take 81 mg by mouth daily.    . cyanocobalamin 500 MCG tablet Take 1 tablet (500 mcg total) by mouth daily. 30 tablet 5  . diazepam (VALIUM) 5 MG tablet TAKE 1 TABLET BY MOUTH EVERY 8 HOURS AS NEEDED FOR ANXIETY    . diclofenac sodium (VOLTAREN) 1 % GEL Apply 2 g topically 4 (four) times daily. 100 g 3  . folic acid (FOLVITE) 1 MG tablet TAKE ONE TABLET BY MOUTH EVERY DAY 30 tablet 3  . HYDROcodone-homatropine (HYCODAN) 5-1.5 MG/5ML syrup Take 5 mLs by mouth every 6 (six) hours as needed for cough. (Patient not taking: Reported on 04/09/2016) 60 mL 0  . isosorbide mononitrate (IMDUR) 30 MG 24 hr tablet Take 30 mg by mouth daily.    Marland Kitchen loratadine (CLARITIN) 10 MG tablet Take 10 mg by mouth daily.    Marland Kitchen loteprednol (LOTEMAX) 0.2 % SUSP 1 drop 4 (four) times daily.    . meclizine (ANTIVERT) 25 MG tablet Take 1 tablet (25 mg total) by mouth 3 (three) times daily as needed for dizziness. (Patient not taking: Reported on 04/09/2016) 30 tablet 0  . metFORMIN (GLUCOPHAGE) 500 MG tablet Take 2 tablets (1,000 mg total) by mouth 2 (two) times daily with a meal. 120 tablet 3  . metoprolol succinate (TOPROL-XL) 50 MG 24 hr tablet TAKE ONE TABLET BY MOUTH EVERY DAY 45 tablet 3  . nitroGLYCERIN (NITROSTAT) 0.4 MG SL tablet Place 0.4 mg under the tongue every 5 (five) minutes as needed for chest pain.    Marland Kitchen olopatadine (PATANOL) 0.1 % ophthalmic solution Place 1 drop into both eyes 2 (two) times daily.    Marland Kitchen omeprazole (PRILOSEC) 20 MG capsule TAKE ONE CAPSULE BY MOUTH EVERY DAY 30 capsule 3  . potassium chloride SA (K-DUR,KLOR-CON) 20 MEQ tablet TAKE THREE TABLETS BY MOUTH TWICE DAILY  240 tablet 3  . simvastatin (ZOCOR) 10 MG tablet Take 1 tablet (10 mg total) by mouth daily. 30 tablet 6  . torsemide (DEMADEX) 20 MG tablet Take 4 tablets (80 mg total) by mouth 2 (two) times daily. 240 tablet 3  . warfarin (COUMADIN) 2 MG tablet  TAKE ONE TABLET EVERY DAY EXCEPT ONE-HALF TABLET ON MONDAY AND WEDNESDAY OR AS DIRECTED by coumadin clinic 80 tablet 3    Objective: BP 103/68   Pulse 88   Temp 99.1 F (37.3 C) (Oral)   Resp 22   Ht 4\' 11"  (1.499 m)   Wt 160 lb (72.6 kg)   SpO2 91%   BMI 32.32 kg/m  Exam: General: NAD, lying in bed HEENT: PERRL, EOMI, MMM Cardiovascular: irregular rhythm, regular rate, no murmurs auscultated Respiratory: CTAB Abdomen: soft, non tender Skin: no rashes or lesions Neuro: AOx3 Psych: normal mood and affect Rectal: mild external hemorrhoids, no evidence of bleeding, lesions or irritation  Labs and Imaging: CBC BMET   Recent Labs Lab 04/12/16 1556  WBC 12.7*  HGB 12.3  HCT 39.1  PLT 152    Recent Labs Lab 04/12/16 1556  NA 139  K 3.4*  CL 102  CO2 28  BUN 12  CREATININE 1.06*  GLUCOSE 128*  CALCIUM 9.3     CXR: Stable cardiomegaly.  No acute findings   Bonney Aid, MD 04/12/2016, 10:58 PM PGY-3, [facility] FPTS Intern pager: [phone], text pages welcome

## 2016-04-13 ENCOUNTER — Encounter (HOSPITAL_COMMUNITY): Payer: Self-pay | Admitting: *Deleted

## 2016-04-13 DIAGNOSIS — E785 Hyperlipidemia, unspecified: Secondary | ICD-10-CM | POA: Diagnosis not present

## 2016-04-13 DIAGNOSIS — R079 Chest pain, unspecified: Secondary | ICD-10-CM | POA: Diagnosis not present

## 2016-04-13 DIAGNOSIS — E860 Dehydration: Secondary | ICD-10-CM | POA: Diagnosis not present

## 2016-04-13 DIAGNOSIS — I209 Angina pectoris, unspecified: Secondary | ICD-10-CM | POA: Diagnosis not present

## 2016-04-13 DIAGNOSIS — I482 Chronic atrial fibrillation: Secondary | ICD-10-CM | POA: Diagnosis not present

## 2016-04-13 DIAGNOSIS — Z7901 Long term (current) use of anticoagulants: Secondary | ICD-10-CM | POA: Diagnosis not present

## 2016-04-13 DIAGNOSIS — I5032 Chronic diastolic (congestive) heart failure: Secondary | ICD-10-CM | POA: Diagnosis not present

## 2016-04-13 LAB — CBC
HCT: 35 % — ABNORMAL LOW (ref 36.0–46.0)
HEMOGLOBIN: 11 g/dL — AB (ref 12.0–15.0)
MCH: 23.2 pg — AB (ref 26.0–34.0)
MCHC: 31.4 g/dL (ref 30.0–36.0)
MCV: 73.8 fL — ABNORMAL LOW (ref 78.0–100.0)
PLATELETS: 132 10*3/uL — AB (ref 150–400)
RBC: 4.74 MIL/uL (ref 3.87–5.11)
RDW: 15.3 % (ref 11.5–15.5)
WBC: 11.2 10*3/uL — ABNORMAL HIGH (ref 4.0–10.5)

## 2016-04-13 LAB — HEPATIC FUNCTION PANEL
ALBUMIN: 3.6 g/dL (ref 3.5–5.0)
ALK PHOS: 55 U/L (ref 38–126)
ALT: 26 U/L (ref 14–54)
AST: 28 U/L (ref 15–41)
BILIRUBIN DIRECT: 0.2 mg/dL (ref 0.1–0.5)
BILIRUBIN TOTAL: 3.2 mg/dL — AB (ref 0.3–1.2)
Indirect Bilirubin: 3 mg/dL — ABNORMAL HIGH (ref 0.3–0.9)
Total Protein: 6.3 g/dL — ABNORMAL LOW (ref 6.5–8.1)

## 2016-04-13 LAB — URIC ACID: URIC ACID, SERUM: 8.4 mg/dL — AB (ref 2.3–6.6)

## 2016-04-13 LAB — BASIC METABOLIC PANEL
Anion gap: 10 (ref 5–15)
BUN: 11 mg/dL (ref 6–20)
CALCIUM: 8.7 mg/dL — AB (ref 8.9–10.3)
CO2: 28 mmol/L (ref 22–32)
CREATININE: 1.02 mg/dL — AB (ref 0.44–1.00)
Chloride: 102 mmol/L (ref 101–111)
GFR, EST NON AFRICAN AMERICAN: 57 mL/min — AB (ref >60)
Glucose, Bld: 126 mg/dL — ABNORMAL HIGH (ref 65–99)
Potassium: 3 mmol/L — ABNORMAL LOW (ref 3.5–5.1)
SODIUM: 140 mmol/L (ref 135–145)

## 2016-04-13 LAB — GLUCOSE, CAPILLARY
GLUCOSE-CAPILLARY: 136 mg/dL — AB (ref 65–99)
Glucose-Capillary: 126 mg/dL — ABNORMAL HIGH (ref 65–99)
Glucose-Capillary: 172 mg/dL — ABNORMAL HIGH (ref 65–99)
Glucose-Capillary: 160 mg/dL — ABNORMAL HIGH (ref 65–99)

## 2016-04-13 LAB — TROPONIN I: Troponin I: <0.03 ng/mL (ref <0.03)

## 2016-04-13 LAB — PROTIME-INR
INR: 2.46
PROTHROMBIN TIME: 27.1 s — AB (ref 11.4–15.2)

## 2016-04-13 MED ORDER — WARFARIN SODIUM 2 MG PO TABS
2.0000 mg | ORAL_TABLET | ORAL | Status: DC
  Administered 2016-04-13: 2 mg via ORAL
  Filled 2016-04-13 (×2): qty 1

## 2016-04-13 MED ORDER — METOPROLOL TARTRATE 5 MG/5ML IV SOLN
25.0000 mg | Freq: Once | INTRAVENOUS | Status: DC
  Filled 2016-04-13: qty 25

## 2016-04-13 MED ORDER — MORPHINE SULFATE (PF) 2 MG/ML IV SOLN: 2.0000 mg | INTRAVENOUS | Status: DC | PRN

## 2016-04-13 MED ORDER — NITROGLYCERIN 0.4 MG SL SUBL: 0.4000 mg | SUBLINGUAL_TABLET | SUBLINGUAL | Status: DC | PRN

## 2016-04-13 MED ORDER — WARFARIN SODIUM 2 MG PO TABS
1.0000 mg | ORAL_TABLET | ORAL | Status: DC
  Administered 2016-04-13: 1 mg via ORAL

## 2016-04-13 MED ORDER — WARFARIN - PHARMACIST DOSING INPATIENT
Freq: Every day | Status: DC
  Administered 2016-04-13: 18:00:00

## 2016-04-13 MED ORDER — ISOSORBIDE MONONITRATE ER 30 MG PO TB24
15.0000 mg | ORAL_TABLET | Freq: Every day | ORAL | Status: DC
  Administered 2016-04-14: 15 mg via ORAL
  Filled 2016-04-13: qty 1

## 2016-04-13 MED ORDER — METOPROLOL TARTRATE 25 MG PO TABS
25.0000 mg | ORAL_TABLET | Freq: Once | ORAL | Status: DC
  Filled 2016-04-13: qty 1

## 2016-04-13 MED ORDER — SODIUM CHLORIDE 0.9 % IV SOLN
INTRAVENOUS | Status: DC
  Administered 2016-04-13: 02:00:00 via INTRAVENOUS

## 2016-04-13 MED ORDER — POTASSIUM CHLORIDE CRYS ER 20 MEQ PO TBCR
40.0000 meq | EXTENDED_RELEASE_TABLET | Freq: Two times a day (BID) | ORAL | Status: AC
  Administered 2016-04-13 (×2): 40 meq via ORAL
  Filled 2016-04-13 (×2): qty 2

## 2016-04-13 MED ORDER — METOPROLOL TARTRATE 25 MG PO TABS
25.0000 mg | ORAL_TABLET | Freq: Two times a day (BID) | ORAL | Status: DC
  Administered 2016-04-14: 25 mg via ORAL
  Filled 2016-04-13: qty 1

## 2016-04-13 MED ORDER — METOPROLOL TARTRATE 5 MG/5ML IV SOLN: 50.0000 mg | Freq: Every day | INTRAVENOUS | Status: DC

## 2016-04-13 NOTE — Progress Notes (Signed)
ANTICOAGULATION CONSULT NOTE - Initial Consult  Pharmacy Consult for Warfarin  Indication: Mechanical Mitral Valve  Allergies  Allergen Reactions  . Promethazine Hcl     REACTION: made her real shaky    Patient Measurements: Height: 5' (152.4 cm) Weight: 160 lb 8 oz (72.8 kg) IBW/kg (Calculated) : 45.5  Vital Signs: Temp: 98.4 F (36.9 C) (08/17 2305) Temp Source: Oral (08/17 2305) BP: 108/68 (08/17 2305) Pulse Rate: 81 (08/17 2305)  Labs:  Recent Labs  04/12/16 1556 04/12/16 1957  HGB 12.3  --   HCT 39.1  --   PLT 152  --   LABPROT  --  29.2*  INR  --  2.70  CREATININE 1.06*  --     Estimated Creatinine Clearance: 48.4 mL/min (by C-G formula based on SCr of 1.06 mg/dL).   Medical History: Past Medical History:  Diagnosis Date  . Allergic rhinitis   . Anemia   . Borderline diabetes   . Carpal tunnel syndrome   . Chronic atrial fibrillation (HCC)    [facility];  Event montor (7/12-8/12) showed no high rate episodes (patient continuously in atrial fibrillation).   . Chronic diastolic heart failure (HCC)   . Eosinophilia 01/27/2013  . External hemorrhoid   . External hemorrhoids   . Fatigue   . Gout   . 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 Gaylyn RongHa  . Hx of cardiovascular stress test    ETT-myoview (7/12): No evidence for ischemia or infarction  . Hyperlipidemia   . Hypertension   . Obesity   . Rheumatic heart disease    Status post mechanical (St. Jude) mitral valve replacement [facility]; echo 5/12:   EF 60-65%, mild AI, mitral valve prosthesis with AVA 1.66, severe LAE, moderate RAE, moderate to severe TR, mild increased pulmonary artery systolic pressure;    Adenosine Cardiolite in 4/09:   No ischemia, EF 66%  . Rotator cuff syndrome     Assessment: 65 y/o F on warfarin PTA for mechanical mitral valve, pt here with chest pain/diarrhea, INR is  therapeutic at 2.7 on admit, CBC good.   PTA Warfarin Dosing: 1 mg Mon/Thurs, 2 mg all other days  Goal of Therapy:  INR 2.5-3.5 Monitor platelets by anticoagulation protocol: Yes   Plan:  -Cont warfarin home regimen  -Daily PT/INR, adjust dose as needed -Monitor for bleeding  Abran [facility], Yeshua Stryker 04/13/2016,12:03 AM

## 2016-04-13 NOTE — Consult Note (Addendum)
Admit date: 04/12/2016 Referring Physician  Dr. Pollie Meyer Primary Physician  Dr. Beryle Flock Primary Cardiologist  Dr. [name] Reason for Consultation  CP  HPI: Deborah [name] is a 65 y.o. female presenting to the ED for diarrhea with lightheadedness. In the ED her diarrhea stopped but she developed squeezing chest discomfort while in the waiting room.  She has a complicated cardiac history with  Rheumatic heart disease status post mechanical MV replacement and tricuspid repair at [facility] in 2002.  Echo (9/16) with EF 55-60%, mild to moderate AI, mechanical mitral valve looked ok, s/p TV repair with mild TR, PASP 40.   She also has a history of chronic atrial fibrillation on chronic anticoagulation, chronic diastolic CHF, hyperlipidemia, HTN and CKD.  She apparently awakened this am with watery diarrhea which continued throughout the morning and then developed dizziness.  She went to the ER and had mucous output from her rectum with blood in the toilet bowel. Apparently she ate at a church Wednesday evening and the next am developed severe diarrhea that lasted all morning.  She developed dizziness and went to the ER.   While in the ER she developed afib with RVR and then had squeezing left sided CP with no radiation or diaphoresis. The CP lasted from 3pm to 11pm last night but then had some more late in the night but none since this am.  She says that she had some SOB while she was in rapid afib but denies any recent DOE with activities. Her trop x 3 was negative.  Potassium low at 3.  Cardiology is now asked to evaluate due to episode of CP and rapid afib.  She has not had a nuclear stress test since 2012 that was normal at that time and stress echo at [facility] last October was normal. Apparently she had been having CP intermittently for a few months and was placed on Imdur by Dr. Earma Reading at [facility] in April which helped her pain.      PMH:   Past Medical History:  Diagnosis Date  . Allergic rhinitis   .  Anemia   . Borderline diabetes   . Carpal tunnel syndrome   . Chronic atrial fibrillation (HCC)    coumadin managed by [facility]. CVRR;  Event montor (7/12-8/12) showed no high rate episodes (patient continuously in atrial fibrillation).   . Chronic diastolic heart failure (HCC)   . Eosinophilia 01/27/2013  . External hemorrhoid   . External hemorrhoids   . Fatigue   . Gout   . 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 Gaylyn Rong  . Hx of cardiovascular stress test    ETT-myoview (7/12): No evidence for ischemia or infarction  . Hyperlipidemia   . Hypertension   . Obesity   . Rheumatic heart disease    Status post mechanical (St. Jude) mitral valve replacement and tricuspid valve repair at [facility] in 2002; echo 5/12:   EF 60-65%, mild AI, mitral valve prosthesis with AVA 1.66, severe LAE, moderate RAE, moderate to severe TR, mild increased pulmonary artery systolic pressure;    Adenosine Cardiolite in 4/09:   No ischemia, EF 66%  . Rotator cuff syndrome      PSH:   Past Surgical History:  Procedure Laterality Date  . CARDIAC CATHETERIZATION     EF 55%  . TRICUSPID VALVE REPAIR      Allergies:  Promethazine hcl Prior to Admit Meds:   Prescriptions  Prior to Admission  Medication Sig Dispense Refill Last Dose  . acetaminophen (TYLENOL) 500 MG tablet Take 500-1,000 mg by mouth every 6 (six) hours as needed for fever (pain).   04/12/2016 at Unknown time  . allopurinol (ZYLOPRIM) 300 MG tablet TAKE ONE TABLET BY MOUTH EVERY DAY 30 tablet 3 week ago  . aspirin 81 MG chewable tablet Chew 81 mg by mouth daily.   04/12/2016 at Unknown time  . benzonatate (TESSALON) 100 MG capsule Take 100 mg by mouth 2 (two) times daily as needed for cough.   2 months ago  . cycloSPORINE (RESTASIS) 0.05 % ophthalmic emulsion Place 1 drop into both eyes 2 (two) times daily.   04/12/2016 at am  . diazepam (VALIUM) 5 MG tablet TAKE 1 TABLET BY MOUTH EVERY 8 HOURS AS  NEEDED FOR ANXIETY   week ago  . diclofenac sodium (VOLTAREN) 1 % GEL Apply 2 g topically 4 (four) times daily. (Patient taking differently: Apply 2 g topically 4 (four) times daily as needed (pain). ) 100 g 3 month ago  . folic acid (FOLVITE) 1 MG tablet TAKE ONE TABLET BY MOUTH EVERY DAY 30 tablet 3 04/12/2016 at Unknown time  . isosorbide mononitrate (IMDUR) 30 MG 24 hr tablet Take 30 mg by mouth daily.   04/12/2016 at Unknown time  . loratadine (CLARITIN) 10 MG tablet Take 10 mg by mouth daily as needed for allergies.    couple days ago  . Menthol-Methyl Salicylate (TIGER BALM LINIMENT EX) Apply 1 application topically daily.   04/12/2016 at Unknown time  . metFORMIN (GLUCOPHAGE) 500 MG tablet Take 2 tablets (1,000 mg total) by mouth 2 (two) times daily with a meal. (Patient taking differently: Take 500 mg by mouth 2 (two) times daily with a meal. ) 120 tablet 3 04/12/2016 at breakfast  . metoprolol succinate (TOPROL-XL) 50 MG 24 hr tablet TAKE ONE TABLET BY MOUTH EVERY DAY 45 tablet 3 04/12/2016 at 800  . nitroGLYCERIN (NITROSTAT) 0.4 MG SL tablet Place 0.4 mg under the tongue every 5 (five) minutes as needed for chest pain.   never used  . Olopatadine HCl (PAZEO) 0.7 % SOLN Place 1 drop into both eyes daily as needed (itching/ irritation).   few weeks ago  . omeprazole (PRILOSEC) 20 MG capsule TAKE ONE CAPSULE BY MOUTH EVERY DAY 30 capsule 3 04/12/2016 at Unknown time  . potassium chloride SA (K-DUR,KLOR-CON) 20 MEQ tablet TAKE THREE TABLETS BY MOUTH TWICE DAILY (Patient taking differently: TAKE THREE TABLETS BY MOUTH TWICE DAILY- MORNING AND MID AFTERNOON) 240 tablet 3 04/12/2016 at afternoon  . simvastatin (ZOCOR) 10 MG tablet Take 1 tablet (10 mg total) by mouth daily. (Patient taking differently: Take 10 mg by mouth at bedtime. ) 30 tablet 6 04/11/2016  . torsemide (DEMADEX) 20 MG tablet Take 4 tablets (80 mg total) by mouth 2 (two) times daily. (Patient taking differently: Take 4 tablets (80 mg  total) by mouth 2 (two) times daily - morning and mid afternoon) 240 tablet 3 04/12/2016 at afternoon  . vitamin B-12 (CYANOCOBALAMIN) 1000 MCG tablet Take 500 mcg by mouth daily.   04/12/2016 at Unknown time  . warfarin (COUMADIN) 2 MG tablet TAKE ONE TABLET EVERY DAY EXCEPT ONE-HALF TABLET ON MONDAY AND WEDNESDAY OR AS DIRECTED by coumadin clinic (Patient taking differently: TAKE 1/2 TABLET ON MONDAY AND THURSDAY NIGHTS, TAKE 1 TABLET ON ALL OTHER NIGHTS OR AS DIRECTED BY COUMADIN CLINIC) 80 tablet 3 04/11/2016  . ACCU-CHEK AVIVA PLUS test  strip USE AS INSTRUCTED TO TEST BLOOD GLUCOSE TWICE A DAY 100 each 12   . allopurinol (ZYLOPRIM) 300 MG tablet TAKE 1 TABLET (300 MG TOTAL) BY MOUTH DAILY. (Patient not taking: Reported on 04/13/2016) 30 tablet 3 Not Taking at Unknown time  . cyanocobalamin 500 MCG tablet Take 1 tablet (500 mcg total) by mouth daily. (Patient not taking: Reported on 04/13/2016) 30 tablet 5 Not Taking at Unknown time  . HYDROcodone-homatropine (HYCODAN) 5-1.5 MG/5ML syrup Take 5 mLs by mouth every 6 (six) hours as needed for cough. (Patient not taking: Reported on 04/09/2016) 60 mL 0 Not Taking at Unknown time  . meclizine (ANTIVERT) 25 MG tablet Take 1 tablet (25 mg total) by mouth 3 (three) times daily as needed for dizziness. (Patient not taking: Reported on 04/09/2016) 30 tablet 0 Not Taking at Unknown time   Fam HX:    Family History  Problem Relation Age of Onset  . Stroke Mother   . Diabetes Mother   . Heart failure Mother     pacemaker  . Heart disease Father     "heart problems"  . Heart disease Sister   . Heart disease Brother   . Gout Brother   . Diabetes Brother   . Stroke Brother   . Valvular heart disease Sister    Social HX:    Social History   Social History  . Marital status: Married    Spouse name: N/A  . Number of children: N/A  . Years of education: N/A   Occupational History  . Not on file.   Social History Main Topics  . Smoking status: Never  Smoker  . Smokeless tobacco: Never Used  . Alcohol use No  . Drug use: No  . Sexual activity: Not on file   Other Topics Concern  . Not on file   Social History Narrative  . No narrative on file     ROS:  All 11 ROS were addressed and are negative except what is stated in the HPI  Physical Exam: Blood pressure (!) 102/56, pulse 75, temperature 98.4 F (36.9 C), temperature source Oral, resp. rate 16, height 5' (1.524 m), weight 160 lb 8 oz (72.8 kg), SpO2 93 %.    General: Well developed, well nourished, in no acute distress Head: Eyes PERRLA, No xanthomas.   Normal cephalic and atramatic  Lungs:   Clear bilaterally to auscultation and percussion. Heart:   Irregularly irregular  S1 S2 Pulses are 2+ & equal.            No carotid bruit. No JVD.  No abdominal bruits. No femoral bruits. Abdomen: Bowel sounds are positive, abdomen soft and non-tender without masses Msk:  Back normal, normal gait. Normal strength and tone for age. Extremities:   No clubbing, cyanosis or edema.  DP +1 Neuro: Alert and oriented X 3. Psych:  Good affect, responds appropriately    Labs:   Lab Results  Component Value Date   WBC 11.2 (H) 04/13/2016   HGB 11.0 (L) 04/13/2016   HCT 35.0 (L) 04/13/2016   MCV 73.8 (L) 04/13/2016   PLT 132 (L) 04/13/2016    Recent Labs Lab 04/13/16 0756  NA 140  K 3.0*  CL 102  CO2 28  BUN 11  CREATININE 1.02*  CALCIUM 8.7*  PROT 6.3*  BILITOT 3.2*  ALKPHOS 55  ALT 26  AST 28  GLUCOSE 126*   No results found for: PTT Lab Results  Component Value Date  INR 2.46 04/13/2016   INR 2.70 04/12/2016   INR 3.1 04/02/2016   Lab Results  Component Value Date   TROPONINI <0.03 04/13/2016     Lab Results  Component Value Date   CHOL 214 (H) 08/30/2015   CHOL 175 10/08/2013   CHOL 205 (H) 12/11/2012   Lab Results  Component Value Date   HDL 70 08/30/2015   HDL 49.70 10/08/2013   HDL 51.30 12/11/2012   Lab Results  Component Value Date    LDLCALC 120 08/30/2015   LDLCALC 99 10/08/2013   LDLCALC 112 (H) 03/24/2012   Lab Results  Component Value Date   TRIG 122 08/30/2015   TRIG 130.0 10/08/2013   TRIG 225.0 (H) 12/11/2012   Lab Results  Component Value Date   CHOLHDL 3.1 08/30/2015   CHOLHDL 4 10/08/2013   CHOLHDL 4 12/11/2012   Lab Results  Component Value Date   LDLDIRECT 104.2 12/11/2012      Radiology:  Dg Chest 2 View  Result Date: 04/12/2016 CLINICAL DATA:  Mucus and blood in diarrhea, fever, chest tightness, shortness of breath and rapid heart rate today. History of valve replacement and diabetes. EXAM: CHEST  2 VIEW COMPARISON:  Chest x-ray dated 11/21/2015. FINDINGS: Cardiomegaly is stable. Overall configuration of the cardiomediastinal silhouette is stable. Median sternotomy wires appear intact and stable in alignment. Lungs are clear. Lung volumes are normal. No pleural effusion or pneumothorax seen. No acute or suspicious osseous finding. IMPRESSION: Stable cardiomegaly.  No acute findings Electronically Signed   By: Bary RichardStan  Maynard M.D.   On: 04/12/2016 17:04    EKG:  Atrial fibrillation with CVR and no acute ST changes  ASSESSMENT/PLAN:   This is a 65 yo female patient of Dr. Alford HighlandMcLean's with rheumatic heart disease and MV replacement/TV repair, chronic atrial fibrillation, and chronic diastolic CHF admitted with what sounds like a bout of food poisoning with severe diarrhea which has subsided, chronic atrial fibrillation with RVR in ER and CP.  1.  Chest pain - I suspect that this is more related to GI illness and rapid afib.  Trop is negative x 3.  She has not had a nuclear stress test since 2012 that was normal at that time and [facility] was normal. Apparently she had been having CP intermittently for a few months and was placed on Imdur [facility]ashore [facility] in April which helped her pain.  Her pain is somewhat atypical but given that she has been having CP intermittently this year  that improved on nitrates and is now having more CP, I have recommended that we get nuclear stress test.  Will make NPO after MN for nuclear stress test in am.     2.  Chronic atrial fibrillation with a bout of RVR in setting of dehydration and acute food poisoning.  HR now controlled after IVF hydration.  Continue warfarin anticoagluation/BB  3.  Rheumatic heart disease and St Jude mechanical MV replacement/TV repair - stable by last echo with mild to moderate AR.  Continue ASA.  4.  Chronic diastolic CHF - she appears euvolemic on exam. Continue diuretic/BB.    5.  Hyperlipidemia - continue statin.   Armanda Magicraci Turner, MD  04/13/2016  3:16 PM

## 2016-04-13 NOTE — Progress Notes (Signed)
Family Medicine Teaching Service Daily Progress Note Intern Pager: [phone]  [name] Medical record number: [id number] Date of birth: 05/05/1951 Age: 65 y.o. Gender: female  Primary Care Provider: Shirlee LatchAngela Bacigalupo, MD Consultants: Cardiology  Code Status: FULL  Pt Overview and Major Events to Date:   Assessment and Plan: Deborah [name] is a 65 y.o. female presenting with chest pain after developing likely dehydration after diarrhea . PMH is significant for chronic Afib, mechanical heart valve (on warfarin), diastolic HF, gout, DM, Rheumatic heart disease.  In the ED her diarrhea stopped but she developed squeezing chest discomfort while in the waiting room  ACS rule out- Chest pain in the setting of likely dehydration with and afib with RvR likely secondary to demand ischemia. HEART score 3. Pain resolving with normalization of heart rate and IV fluids, however given extensive cardiac history there is concern for cardiac pathology. Troponins neg in the Ed x2. EKG without ischemic pattern. Last seen by HF on 8/14 at which time there was discussion of stress testing for occasional atypical chest pain. [facility] normal - trend troponins (Neg x2 so far) - EKG in the AM showing afib - aspirin 81 mg - nitro SL, morphine Q3 PRN chest pain - GI cocktail - consulted cards this am, appreciate reccs  Diarrhea/ with question rectal bleeding- diarrhea has since resolved. Diarrhea likely due to food borne illness given association of illness of others with same meal. No evidence of bleeding on exam with negative FOBT in the ED. Unclear if she actually had blood in her stool given negative testing. Given her presentation with chest pain and her extensive clot risk ( afib, mechanical heart valve) the risks of holding anticoagulation may out weigh the benefits. Hemoglobin 12.3, stable from last check 11.9 on 8/14 - stools studies if continues to have diarrhea -  hold anticoagulation if develops bleeding - zofran PRN nausea  Dehydration- Concern for dehydration with fluid losses from diarrhea. Creatinine 1.02 this am with baseline approx 0.9-1 - follow renal studies - discontinued fluids given no signs of dehydration on exam, and to avoid volume overload given her history of CHF  Diastolic HF- No evidence of volume overload. Last echo 04/2015 EF 55-60% - restarted home torsemide 80mg   - continue home Imdur 15mg  , metoprolol 50mg  daily (decreased Imdur dose given BP well controlled)  Afib with mechanical mitral valve in place- INR 2.7 on admission. RvR resolved in the ED with IV metoprolol - continue home warfarin 2mg  , metoprolol  Type 2 Diabetes- A1c 7.0 on 01/2016 - SSI ACHS  Gout- last uric acid 4.7 04/2013 -  Continue home allopurinol -check uric acid level   FEN/GI: carb/heart healthy diet, NS 50 cc/hr,  Prophylaxis: warfarin  Disposition: Dispo pending clinical improvement  Subjective:  Patient states she feels well today. Chest pain not reproducible on exam but reports she has some slight chest discomfort.   Objective: Temp:  [98.4 F (36.9 C)-99.1 F (37.3 C)] 98.4 F (36.9 C) (08/18 1135) Pulse Rate:  [70-118] 75 (08/18 1215) Resp:  [15-23] 16 (08/18 1135) BP: (102-122)/(42-84) 102/56 (08/18 1215) SpO2:  [91 %-99 %] 93 % (08/18 1135) Weight:  [160 lb (72.6 kg)-160 lb 8 oz (72.8 kg)] 160 lb 8 oz (72.8 kg) (08/17 2305) Physical Exam: General: NAD, lying in bed HEENT: PERRL, EOMI, MMM Cardiovascular: irregular rhythm, regular rate, no murmurs auscultated Respiratory: CTAB Abdomen: soft, non tender Skin: no rashes or lesions Neuro: AOx3  Psych: normal mood and affect  Laboratory:  Recent Labs Lab 04/09/16 1117 04/12/16 1556 04/13/16 0756  WBC 6.1 12.7* 11.2*  HGB 11.9* 12.3 11.0*  HCT 38.0 39.1 35.0*  PLT 136* 152 132*    Recent Labs Lab 04/09/16 1117 04/12/16 1556 04/13/16 0756  NA 140 139 140  K  3.7 3.4* 3.0*  CL 105 102 102  CO2 26 28 28   BUN 14 12 11   CREATININE 0.91 1.06* 1.02*  CALCIUM 9.4 9.3 8.7*  PROT  --   --  6.3*  BILITOT  --   --  3.2*  ALKPHOS  --   --  55  ALT  --   --  26  AST  --   --  28  GLUCOSE 174* 128* 126*   Imaging/Diagnostic Tests:  Dg Chest 2 View  Result Date: 04/12/2016 CLINICAL DATA:  Mucus and blood in diarrhea, fever, chest tightness, shortness of breath and rapid heart rate today. History of valve replacement and diabetes. EXAM: CHEST  2 VIEW COMPARISON:  Chest x-ray dated 11/21/2015. FINDINGS: Cardiomegaly is stable. Overall configuration of the cardiomediastinal silhouette is stable. Median sternotomy wires appear intact and stable in alignment. Lungs are clear. Lung volumes are normal. No pleural effusion or pneumothorax seen. No acute or suspicious osseous finding. IMPRESSION: Stable cardiomegaly.  No acute findings Electronically Signed   By: Bary RichardStan  Maynard M.D.   On: 04/12/2016 17:04    Freddrick MarchYashika Juanluis Guastella, MD 04/13/2016, 3:59 PM PGY-1, [place] [facility] FPTS Intern pager: [phone], text pages welcome

## 2016-04-13 NOTE — Progress Notes (Signed)
Nutrition Brief Note  Malnutrition Screening Tool result is inaccurate.  Patient's weight is stable.    Wt Readings from Last 15 Encounters:  04/12/16 160 lb 8 oz (72.8 kg)  04/09/16 160 lb 4 oz (72.7 kg)  02/21/16 161 lb 12.8 oz (73.4 kg)  12/02/15 162 lb 12 oz (73.8 kg)  11/21/15 157 lb (71.2 kg)  11/16/15 160 lb 12.8 oz (72.9 kg)  11/11/15 163 lb (73.9 kg)  10/17/15 162 lb (73.5 kg)  08/30/15 158 lb (71.7 kg)  08/26/15 162 lb (73.5 kg)  08/15/15 164 lb 3.2 oz (74.5 kg)  07/18/15 161 lb 12 oz (73.4 kg)  04/19/15 160 lb (72.6 kg)  04/07/15 161 lb (73 kg)  03/24/15 158 lb (71.7 kg)    Body mass index is 31.35 kg/m. Patient meets criteria for Obesity based on current BMI.   Current diet order is Heart healthy, patient is consuming approximately 50-100% of meals at this time. Labs and medications reviewed.   No nutrition interventions warranted at this time. If nutrition issues arise, please consult RD.   Dorothea Ogleeanne Dejah Droessler RD, LDN Inpatient Clinical Dietitian Pager: [phone] After Hours Pager: [phone]

## 2016-04-14 ENCOUNTER — Other Ambulatory Visit: Payer: Self-pay | Admitting: Cardiology

## 2016-04-14 DIAGNOSIS — R079 Chest pain, unspecified: Secondary | ICD-10-CM

## 2016-04-14 DIAGNOSIS — R0789 Other chest pain: Secondary | ICD-10-CM

## 2016-04-14 DIAGNOSIS — E860 Dehydration: Secondary | ICD-10-CM | POA: Diagnosis not present

## 2016-04-14 DIAGNOSIS — I482 Chronic atrial fibrillation: Secondary | ICD-10-CM | POA: Diagnosis not present

## 2016-04-14 DIAGNOSIS — I5032 Chronic diastolic (congestive) heart failure: Secondary | ICD-10-CM | POA: Diagnosis not present

## 2016-04-14 LAB — GLUCOSE, CAPILLARY
GLUCOSE-CAPILLARY: 168 mg/dL — AB (ref 65–99)
Glucose-Capillary: 194 mg/dL — ABNORMAL HIGH (ref 65–99)

## 2016-04-14 LAB — CBC
HCT: 37.7 % (ref 36.0–46.0)
HEMOGLOBIN: 11.9 g/dL — AB (ref 12.0–15.0)
MCH: 23.3 pg — ABNORMAL LOW (ref 26.0–34.0)
MCHC: 31.6 g/dL (ref 30.0–36.0)
MCV: 73.8 fL — ABNORMAL LOW (ref 78.0–100.0)
Platelets: 140 10*3/uL — ABNORMAL LOW (ref 150–400)
RBC: 5.11 MIL/uL (ref 3.87–5.11)
RDW: 15.3 % (ref 11.5–15.5)
WBC: 6.5 10*3/uL (ref 4.0–10.5)

## 2016-04-14 LAB — PROTIME-INR
INR: 2.52
PROTHROMBIN TIME: 27.6 s — AB (ref 11.4–15.2)

## 2016-04-14 LAB — BASIC METABOLIC PANEL
Anion gap: 10 (ref 5–15)
BUN: 15 mg/dL (ref 6–20)
CHLORIDE: 104 mmol/L (ref 101–111)
CO2: 26 mmol/L (ref 22–32)
Calcium: 9 mg/dL (ref 8.9–10.3)
Creatinine, Ser: 0.97 mg/dL (ref 0.44–1.00)
GFR calc non Af Amer: >60 mL/min (ref >60)
GLUCOSE: 195 mg/dL — AB (ref 65–99)
POTASSIUM: 3.2 mmol/L — AB (ref 3.5–5.1)
SODIUM: 140 mmol/L (ref 135–145)

## 2016-04-14 MED ORDER — ISOSORBIDE MONONITRATE ER 30 MG PO TB24: 15.0000 mg | ORAL_TABLET | Freq: Every day | ORAL | 0 refills | Status: DC

## 2016-04-14 NOTE — Progress Notes (Signed)
Family Medicine Teaching Service Daily Progress Note Intern Pager: [phone]  [name] Medical record number: [id number] Date of birth: 06/22/1951 Age: 65 y.o. Gender: female  Primary Care Provider: Shirlee LatchAngela Bacigalupo, MD Consultants: Cardiology  Code Status: FULL  Pt Overview and Major Events to Date:   Assessment and Plan: Deborah [name] is a 65 y.o. female presenting with chest pain after developing likely dehydration after diarrhea . PMH is significant for chronic Afib, mechanical heart valve (on warfarin), diastolic HF, gout, DM, Rheumatic heart disease.  In the ED her diarrhea stopped but she developed squeezing chest discomfort while in the waiting room  ACS rule out- Chest pain in the setting of likely dehydration with and afib with RvR likely secondary to demand ischemia.  - troponins Neg x3 - aspirin 81 mg - nitro SL, morphine Q3 PRN chest pain - consulted cards, recommend stress testing. Patient wants to do this scheduled as outpatient. Will make appointment for her this week.  Diarrhea/ with question rectal bleeding- diarrhea has since resolved.  - zofran PRN nausea  Dehydration- resolved. Concern for dehydration with fluid losses from diarrhea. Creatinine 1.02 this am with baseline approx 0.9-1 - discontinued fluids given no signs of dehydration on exam, and to avoid volume overload given her history of CHF  Diastolic HF- No evidence of volume overload. Last echo 04/2015 EF 55-60% - restarted home torsemide 80mg   - continue home Imdur 15mg  , metoprolol 50mg  daily (decreased Imdur to 15mg  dose given BP well controlled)  Afib with mechanical mitral valve in place- INR 2.7 on admission. RvR resolved in the ED with IV metoprolol - continue home warfarin 2mg  , metoprolol  Type 2 Diabetes- A1c 7.0 on 01/2016 - SSI ACHS  Gout -  At home on allopurinol -Uric acid level elevated at 8.4 -consider increasing Allopurinol dose  FEN/GI: carb/heart  healthy diet, NS 50 cc/hr,  Prophylaxis: warfarin  Disposition: Discharge home today  Subjective:  Patient states she feels well today. No complaints.  Objective: Temp:  [97.5 F (36.4 C)-98.4 F (36.9 C)] 97.5 F (36.4 C) (08/19 0934) Pulse Rate:  [75-94] 86 (08/19 0934) Resp:  [18] 18 (08/19 0934) BP: (102-109)/(36-66) 105/66 (08/19 0934) SpO2:  [90 %-96 %] 96 % (08/19 0934) Weight:  [157 lb 11.2 oz (71.5 kg)] 157 lb 11.2 oz (71.5 kg) (08/19 0525) Physical Exam: General: NAD, lying in bed HEENT: PERRL, EOMI, MMM Cardiovascular: irregular rhythm, regular rate, no murmurs auscultated Respiratory: CTAB Abdomen: soft, non tender Skin: no rashes or lesions Neuro: AOx3 Psych: normal mood and affect  Laboratory:  Recent Labs Lab 04/12/16 1556 04/13/16 0756 04/14/16 0231  WBC 12.7* 11.2* 6.5  HGB 12.3 11.0* 11.9*  HCT 39.1 35.0* 37.7  PLT 152 132* 140*    Recent Labs Lab 04/12/16 1556 04/13/16 0756 04/14/16 0231  NA 139 140 140  K 3.4* 3.0* 3.2*  CL 102 102 104  CO2 28 28 26   BUN 12 11 15   CREATININE 1.06* 1.02* 0.97  CALCIUM 9.3 8.7* 9.0  PROT  --  6.3*  --   BILITOT  --  3.2*  --   ALKPHOS  --  55  --   ALT  --  26  --   AST  --  28  --   GLUCOSE 128* 126* 195*   Imaging/Diagnostic Tests:  No results found.  Deborah MarchYashika Dayjah Selman, MD 04/14/2016, 12:03 PM PGY-1, [facility] FPTS Intern pager: [phone], text pages welcome

## 2016-04-14 NOTE — Discharge Summary (Signed)
Family Medicine Teaching [facility] Discharge Summary  Patient name: Deborah [name] Medical record number: [id number] Date of birth: 13-Dec-1950 Age: 65 y.o. Gender: female Date of Admission: 04/12/2016  Date of Discharge: 04/14/2016 Admitting Physician: Latrelle Dodrill, MD  Primary Care Provider: [name], MD Consultants: Cardiology  Indication for Hospitalization: diarrhea, ACS rule out  Discharge Diagnoses/Problem List:  Chronic a fib on warfarin Chest pain, nonspecific Dehydration Diastolic CHF HLD [id number] Gout  Disposition: Home.  Discharge Condition: Improved, stable.  Discharge Exam: General: NAD, lying in bed HEENT: PERRL, EOMI, MMM Cardiovascular: irregular rhythm, regular rate, no murmurs auscultated Respiratory: CTAB, no wheezing noted on exam Abdomen: soft, non tender, non distended, +bs, no masses Skin: no rashes or lesions Neuro: AOx3 Psych: normal mood and affect  Brief Hospital Course:  65 y.o.femalepresenting with chest pain after developing likely dehydration after diarrhea. 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  In the ED her diarrhea stopped but she developed squeezing chest discomfort while in the waiting room. Was worked up for ACS rule out.   ACS Rule out  Patient complaining of chest pain in the setting of likely dehydration with and afib with RvR likely secondary to demand ischemia. Troponins Neg x3, patient on aspirin, sublingual Nitro, morphine and cardiology consulted. Recommended stress testing which the patient states she would like to have done outpatient once discharged. Appointment scheduled for stress test this week. Patient taking Imdur 30 mg and Metoprolol 50 mg at home. At time of d/c, Imdur dose decreased to 15mg  given well controlled BPs.   Dehydration Resolved. Initial concern for dehydration with fluid losses from diarrhea.  Creatinine 0.97 at time of discharge with baseline approx 0.9-1. Initially given gentle fluids on admission. Fluids later discontinued given no signs of dehydration on exam, and to avoid volume overload given her history of CHF.  Diastolic HF No evidence of volume overload on exam. Last echo 04/2015 showing EF 55-60%. Home Torsemide was started.  Afib with mechanical mitralvalve in place INR 2.7 on admission. RvR resolved in the ED with IV metoprolol. Home warfarin 2mg  and Metoprolol 50mg  continued.   Issues for Follow Up:  1. Outpatient stress test scheduled to evaluate further 2. In light of stable BPs, decreased Imdur dose to 15 mg daily, continue Metoprolol 50 mg daily. Will require close follow up with PCP. 3. Consider increasing Allopurinol dose, given elevated uric acid level of 8.4.  Significant Procedures: None  Significant Labs and Imaging:   Recent Labs Lab 04/12/16 1556 04/13/16 0756 04/14/16 0231  WBC 12.7* 11.2* 6.5  HGB 12.3 11.0* 11.9*  HCT 39.1 35.0* 37.7  PLT 152 132* 140*    Recent Labs Lab 04/09/16 1117 04/12/16 1556 04/13/16 0756 04/14/16 0231  NA 140 139 140 140  K 3.7 3.4* 3.0* 3.2*  CL 105 102 102 104  CO2 26 28 28 26   GLUCOSE 174* 128* 126* 195*  BUN 14 12 11 15   CREATININE 0.91 1.06* 1.02* 0.97  CALCIUM 9.4 9.3 8.7* 9.0  ALKPHOS  --   --  55  --   AST  --   --  28  --   ALT  --   --  26  --   ALBUMIN  --   --  3.6  --    Results/Tests Pending at Time of Discharge: None  Discharge Medications:    Medication List    STOP taking these medications  benzonatate 100 MG capsule Commonly known as:  TESSALON   HYDROcodone-homatropine 5-1.5 MG/5ML syrup Commonly known as:  HYCODAN     TAKE these medications   ACCU-CHEK AVIVA PLUS test strip Generic drug:  glucose blood USE AS INSTRUCTED TO TEST BLOOD GLUCOSE TWICE A DAY   acetaminophen 500 MG tablet Commonly known as:  TYLENOL Take 500-1,000 mg by mouth every 6 (six) hours as  needed for fever (pain).   allopurinol 300 MG tablet Commonly known as:  ZYLOPRIM TAKE ONE TABLET BY MOUTH EVERY DAY What changed:  Another medication with the same name was removed. Continue taking this medication, and follow the directions you see here.   aspirin 81 MG chewable tablet Chew 81 mg by mouth daily.   cyanocobalamin 500 MCG tablet Take 1 tablet (500 mcg total) by mouth daily. What changed:  Another medication with the same name was removed. Continue taking this medication, and follow the directions you see here.   cycloSPORINE 0.05 % ophthalmic emulsion Commonly known as:  RESTASIS Place 1 drop into both eyes 2 (two) times daily.   diazepam 5 MG tablet Commonly known as:  VALIUM TAKE 1 TABLET BY MOUTH EVERY 8 HOURS AS NEEDED FOR ANXIETY   diclofenac sodium 1 % Gel Commonly known as:  VOLTAREN Apply 2 g topically 4 (four) times daily. What changed:  when to take this  reasons to take this   folic acid 1 MG tablet Commonly known as:  FOLVITE TAKE ONE TABLET BY MOUTH EVERY DAY   isosorbide mononitrate 30 MG 24 hr tablet Commonly known as:  IMDUR Take 0.5 tablets (15 mg total) by mouth daily. What changed:  how much to take   loratadine 10 MG tablet Commonly known as:  CLARITIN Take 10 mg by mouth daily as needed for allergies.   meclizine 25 MG tablet Commonly known as:  ANTIVERT Take 1 tablet (25 mg total) by mouth 3 (three) times daily as needed for dizziness.   metFORMIN 500 MG tablet Commonly known as:  GLUCOPHAGE Take 2 tablets (1,000 mg total) by mouth 2 (two) times daily with a meal. What changed:  how much to take   metoprolol succinate 50 MG 24 hr tablet Commonly known as:  TOPROL-XL TAKE ONE TABLET BY MOUTH EVERY DAY   nitroGLYCERIN 0.4 MG SL tablet Commonly known as:  NITROSTAT Place 0.4 mg under the tongue every 5 (five) minutes as needed for chest pain.   omeprazole 20 MG capsule Commonly known as:  PRILOSEC TAKE ONE CAPSULE BY  MOUTH EVERY DAY   PAZEO 0.7 % Soln Generic drug:  Olopatadine HCl Place 1 drop into both eyes daily as needed (itching/ irritation).   potassium chloride SA 20 MEQ tablet Commonly known as:  K-DUR,KLOR-CON TAKE THREE TABLETS BY MOUTH TWICE DAILY What changed:  See the new instructions.   simvastatin 10 MG tablet Commonly known as:  ZOCOR Take 1 tablet (10 mg total) by mouth daily. What changed:  when to take this   TIGER BALM LINIMENT EX Apply 1 application topically daily.   torsemide 20 MG tablet Commonly known as:  DEMADEX Take 4 tablets (80 mg total) by mouth 2 (two) times daily. What changed:  See the new instructions.   warfarin 2 MG tablet Commonly known as:  COUMADIN TAKE ONE TABLET EVERY DAY EXCEPT ONE-HALF TABLET ON MONDAY AND WEDNESDAY OR AS DIRECTED by coumadin clinic What changed:  See the new instructions.      Discharge Instructions: Please  refer to Patient Instructions section of EMR for full details.  Patient was counseled important signs and symptoms that should prompt return to medical care, changes in medications, dietary instructions, activity restrictions, and follow up appointments.   Follow-Up Appointments: Follow-up Information    Rodrigo Ranrystal Dorsey, MD. Go on 04/20/2016.   Specialty:  Family Medicine Why:  Appointment for hospital follow up on Fri, 8/25 at 2.45 pm. Contact information: [address] [place] [address] [phone]          Freddrick MarchYashika Nickole Adamek, MD 04/14/2016, 7:42 PM PGY-1, [facility]

## 2016-04-14 NOTE — Progress Notes (Signed)
Patient discharge to home accompanied by family members and NT via wheelchair. Discharge instructons given patient and patient;s daughter verbalizes understanding. All personal belongings given. Telemetry box and IV removed prior to discharge and site in good condition.

## 2016-04-14 NOTE — Progress Notes (Signed)
ANTICOAGULATION CONSULT NOTE - Initial Consult  Pharmacy Consult for Warfarin  Indication: Mechanical Mitral Valve  Allergies  Allergen Reactions  . Promethazine Hcl Other (See Comments)    REACTION: made her real shaky    Patient Measurements: Height: 5' (152.4 cm) Weight: 157 lb 11.2 oz (71.5 kg) (scale a) IBW/kg (Calculated) : 45.5  Vital Signs: Temp: 97.5 F (36.4 C) (08/19 0934) Temp Source: Oral (08/19 0934) BP: 105/66 (08/19 0934) Pulse Rate: 86 (08/19 0934)  Labs:  Recent Labs  04/12/16 1556 04/12/16 1957 04/13/16 0127 04/13/16 0406 04/13/16 0756 04/14/16 0231  HGB 12.3  --   --   --  11.0* 11.9*  HCT 39.1  --   --   --  35.0* 37.7  PLT 152  --   --   --  132* 140*  LABPROT  --  29.2*  --   --  27.1* 27.6*  INR  --  2.70  --   --  2.46 2.52  CREATININE 1.06*  --   --   --  1.02* 0.97  TROPONINI  --   --  <0.03 <0.03 <0.03  --     Estimated Creatinine Clearance: 52.4 mL/min (by C-G formula based on SCr of 0.97 mg/dL).   Medical History: Past Medical History:  Diagnosis Date  . Allergic rhinitis   . Anemia   . Borderline diabetes   . Carpal tunnel syndrome   . Chronic atrial fibrillation (HCC)    [facility];  Event montor (7/12-8/12) showed no high rate episodes (patient continuously in atrial fibrillation).   . Chronic diastolic heart failure (HCC)   . Eosinophilia 01/27/2013  . External hemorrhoid   . External hemorrhoids   . Fatigue   . Gout   . 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 Gaylyn RongHa  . Hx of cardiovascular stress test    ETT-myoview (7/12): No evidence for ischemia or infarction  . Hyperlipidemia   . Hypertension   . Obesity   . Rheumatic heart disease    Status post mechanical ([employer]) mitral valve [facility]; echo 5/12:   EF 60-65%, mild AI, mitral valve prosthesis with AVA 1.66, severe LAE, moderate RAE,  moderate to severe TR, mild increased pulmonary artery systolic pressure;    Adenosine Cardiolite in 4/09:   No ischemia, EF 66%  . Rotator cuff syndrome     Assessment: 65 y/o F on warfarin PTA for mechanical mitral valve, pt here with chest pain/diarrhea, INR is therapeutic at 2.7 on admit, CBC good. INR today is still therapeutic at 2.52. Will watch carefully - may need to bump dose as close to being subtherapeutic.  PTA Warfarin Dosing: 1 mg Mon/Thurs, 2 mg all other days  Goal of Therapy:  INR 2.5-3.5 Monitor platelets by anticoagulation protocol: Yes   Plan:  - Continue home warfarin dose   - Daily PT/INR, adjust dose as needed - Monitor for s/sx of bleeding  Cassie L. Roseanne RenoStewart, PharmD Clinical Pharmacist Pager: [phone] 04/14/2016 10:55 AM

## 2016-04-14 NOTE — Discharge Instructions (Signed)
You were admitted to the family medicine teaching service for chest pain and diarrhea. Your diarrhea had resolved during admission, however we did a workup of your chest pain to make sure it was not something serious. We followed your troponins (a blood lab) which remained negative and obtained EKG which was also reassuring of no cardiac cause. You will need to be followed closely by your cardiologist, Dr. Shirlee LatchMcLean once discharged and will need a stress test done outpatient.   -You will be receiving a call from the scheduling service for an outpatient stress test which will need to be followed up on.  [facility] follow up appointment scheduled with Dr. Leonides Schanzorsey on 8/25 at 2.45 pm.

## 2016-04-17 LAB — CULTURE, BLOOD (ROUTINE X 2)
Culture: NO GROWTH
Culture: NO GROWTH

## 2016-04-20 ENCOUNTER — Ambulatory Visit (INDEPENDENT_AMBULATORY_CARE_PROVIDER_SITE_OTHER): Payer: Medicaid Other | Admitting: Family Medicine

## 2016-04-20 VITALS — BP 103/65 | HR 76 | Temp 97.9°F | Wt 157.6 lb

## 2016-04-20 DIAGNOSIS — E118 Type 2 diabetes mellitus with unspecified complications: Secondary | ICD-10-CM | POA: Diagnosis not present

## 2016-04-20 DIAGNOSIS — M109 Gout, unspecified: Secondary | ICD-10-CM

## 2016-04-20 DIAGNOSIS — I482 Chronic atrial fibrillation, unspecified: Secondary | ICD-10-CM

## 2016-04-20 DIAGNOSIS — Z0000 Encounter for general adult medical examination without abnormal findings: Secondary | ICD-10-CM | POA: Diagnosis present

## 2016-04-20 DIAGNOSIS — I959 Hypotension, unspecified: Secondary | ICD-10-CM

## 2016-04-20 DIAGNOSIS — E876 Hypokalemia: Secondary | ICD-10-CM

## 2016-04-20 DIAGNOSIS — R0789 Other chest pain: Secondary | ICD-10-CM

## 2016-04-20 DIAGNOSIS — Z23 Encounter for immunization: Secondary | ICD-10-CM | POA: Diagnosis not present

## 2016-04-20 LAB — BASIC METABOLIC PANEL WITH GFR
BUN: 20 mg/dL (ref 7–25)
CHLORIDE: 103 mmol/L (ref 98–110)
CO2: 26 mmol/L (ref 20–31)
CREATININE: 1.07 mg/dL — AB (ref 0.50–0.99)
Calcium: 9.4 mg/dL (ref 8.6–10.4)
GFR, Est African American: 64 mL/min (ref >=60)
GFR, Est Non African American: 55 mL/min — ABNORMAL LOW (ref >=60)
Glucose, Bld: 109 mg/dL — ABNORMAL HIGH (ref 65–99)
POTASSIUM: 3.8 mmol/L (ref 3.5–5.3)
SODIUM: 141 mmol/L (ref 135–146)

## 2016-04-20 NOTE — Progress Notes (Signed)
Subjective: ZO:XWRUEAVWCC:hospital follow up HPI: Patient is a 65 y.o. female with a past medical history of dehydration, hemoglobin E disease, afib, anemia presenting to clinic today for a hospital follow up.  She initially went to the hospital for diarrhea leading to dehydration, then noted chest pain while in the ED. She was admitted for an ACS rule out. During her stay she developed Afib with RVR which resolved and was stable on metoprolol and coumadin at discharge. Cardiology noted she needed a stress test as an outpatient. The patient states she hasn't had any chest pain or SOB. She denies palpitations but notes sometimes she feels like her heart beats hard when she exerts herself. No diarrhea since her hospitalization. No abdominal pain. She's eating well. She has an appt with cardiology for labwork and then for a stress test.    A uric acid level was obtained and elevated and follow up was to consider increasing allopurinol. The patient denies any concerns for recent gout flares currently. She cannot recall her last flare. =  She checks her vitals at home, typically they are around BPS 113/79, HR 80-85.   Social History: never  ROS: All other systems reviewed and are negative besides that noted in HPI.  Past Medical History Patient Active Problem List   Diagnosis Date Noted  . Nonspecific chest pain   . Dehydration 04/12/2016  . Vision changes 02/21/2016  . Viral URI 08/30/2015  . Abdominal pain, right upper quadrant 03/24/2015  . 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 Acute bronchitis 09/09/2014  . Bacterial upper respiratory infection 06/30/2014  . Right shoulder pain 06/02/2014  . Chest pain 02/24/2014  . Encounter for therapeutic drug monitoring 09/30/2013  . 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 H/O prosthetic heart valve 07/08/2013  . Eosinophilia 01/27/2013  . CAP (community acquired pneumonia) 12/12/2012  . Hypotension 12/12/2012  . Microcytic  anemia 05/01/2012  . 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 Postprocedural state 05/22/2011  . Diastolic CHF, chronic (HCC) 03/15/2011  . Rheumatic heart disease   . Chronic atrial fibrillation (HCC)   . Chronic anticoagulation   . DM (diabetes mellitus) (HCC)   . Hyperlipidemia   . Mitral valve disorder 11/16/2010  . GOUT 11/29/2009  . TRICUSPID REGURGITATION, MODERATE WITH MILD PULMONARY HTN 11/29/2009  . HYPERBILIRUBINEMIA 06/22/2009  . HYPERGLYCEMIA 06/21/2009  . Hyperlipidemia associated with type 2 diabetes mellitus (HCC) 01/07/2009  . GERD 01/07/2009  . CARPAL TUNNEL SYNDROME, LEFT 11/11/2008    Medications- reviewed and updated  Objective: Office vital signs reviewed. BP 103/65 (BP Location: Left Arm, Patient Position: Sitting, Cuff Size: Normal)   Pulse 76   Temp 97.9 F (36.6 C) (Oral)   Wt 157 lb 9.6 oz (71.5 kg)   SpO2 97%   BMI 30.78 kg/m    Physical Examination:  General: Awake, alert, well- nourished, NAD, pleasant.  Cardio: Irregular rate and rhythm, no m/r/g noted. No significant increase in heart rate once patient exerted herself and noted mild rapid heart beat.   Pulm: No increased WOB.  CTAB, without wheezes, rhonchi or crackles noted.  GI: soft, NT/ND,+BS x4, no hepatomegaly, no splenomegaly Extremities: No pitting edema.   Assessment/Plan: Chronic atrial fibrillation (HCC) Currently rate controlled. On Coumadin.  - continue metoprolol and coumadin.  Hypotension Resolved. Continue with lower dose of Imdur, not having chest pain with lower dose.   Chest pain Resolved. - f/u with cardiology for stress test.   GOUT No evidence of  recent flare. Given an increase in allopurinol will affect coumadin dosing, will hold off on change currently and defer to PCP.   Orders Placed This Encounter  Procedures  . BASIC METABOLIC PANEL WITH GFR    No orders of the defined types were placed in this encounter.   Joanna Puff PGY-3, [facility] Family  Medicine

## 2016-04-20 NOTE — Patient Instructions (Signed)
It was good to see you. Continue all your medications as they were on discharge. Follow-up with the cardiologist about the lab work and stress test. I will check your kidneys, your potassium, and your sodium today. I will call you if these results are not normal, otherwise I will mail you a letter.  Follow-up with Dr. Beryle Flock in 2-3 months.  Atrial Fibrillation Atrial fibrillation is a type of irregular or rapid heartbeat (arrhythmia). In atrial fibrillation, the heart quivers continuously in a chaotic pattern. This occurs when parts of the heart receive disorganized signals that make the heart unable to pump blood normally. This can increase the risk for stroke, heart failure, and other heart-related conditions. There are different types of atrial fibrillation, including:  Paroxysmal atrial fibrillation. This type starts suddenly, and it usually stops on its own shortly after it starts.  Persistent atrial fibrillation. This type often lasts longer than a week. It may stop on its own or with treatment.  Long-lasting persistent atrial fibrillation. This type lasts longer than 12 months.  Permanent atrial fibrillation. This type does not go away. Talk with your health care provider to learn about the type of atrial fibrillation that you have. CAUSES This condition is caused by some heart-related conditions or procedures, including:  A heart attack.  Coronary artery disease.  Heart failure.  Heart valve conditions.  High blood pressure.  Inflammation of the sac that surrounds the heart (pericarditis).  Heart surgery.  Certain heart rhythm disorders, such as Wolf-Parkinson-White syndrome. Other causes include:  Pneumonia.  Obstructive sleep apnea.  Blockage of an artery in the lungs (pulmonary embolism, or PE).  Lung cancer.  Chronic lung disease.  Thyroid problems, especially if the thyroid is overactive (hyperthyroidism).  Caffeine.  Excessive alcohol use or  illegal drug use.  Use of some medicines, including certain decongestants and diet pills. Sometimes, the cause cannot be found. RISK FACTORS This condition is more likely to develop in:  People who are older in age.  People who smoke.  People who have diabetes mellitus.  People who are overweight (obese).  Athletes who exercise vigorously. SYMPTOMS Symptoms of this condition include:  A feeling that your heart is beating rapidly or irregularly.  A feeling of discomfort or pain in your chest.  Shortness of breath.  Sudden light-headedness or weakness.  Getting tired easily during exercise. In some cases, there are no symptoms. DIAGNOSIS Your health care provider may be able to detect atrial fibrillation when taking your pulse. If detected, this condition may be diagnosed with:  An electrocardiogram (ECG).  A Holter monitor test that records your heartbeat patterns over a 24-hour period.  Transthoracic echocardiogram (TTE) to evaluate how blood flows through your heart.  Transesophageal echocardiogram (TEE) to view more detailed images of your heart.  A stress test.  Imaging tests, such as a CT scan or chest X-ray.  Blood tests. TREATMENT The main goals of treatment are to prevent blood clots from forming and to keep your heart beating at a normal rate and rhythm. The type of treatment that you receive depends on many factors, such as your underlying medical conditions and how you feel when you are experiencing atrial fibrillation. This condition may be treated with:  Medicine to slow down the heart rate, bring the heart's rhythm back to normal, or prevent clots from forming.  Electrical cardioversion. This is a procedure that resets your heart's rhythm by delivering a controlled, low-energy shock to the heart through your skin.  Different  types of ablation, such as catheter ablation, catheter ablation with pacemaker, or surgical ablation. These procedures destroy  the heart tissues that send abnormal signals. When the pacemaker is used, it is placed under your skin to help your heart beat in a regular rhythm. HOME CARE INSTRUCTIONS  Take over-the counter and prescription medicines only as told by your health care provider.  If your health care provider prescribed a blood-thinning medicine (anticoagulant), take it exactly as told. Taking too much blood-thinning medicine can cause bleeding. If you do not take enough blood-thinning medicine, you will not have the protection that you need against stroke and other problems.  Do not use tobacco products, including cigarettes, chewing tobacco, and e-cigarettes. If you need help quitting, ask your health care provider.  If you have obstructive sleep apnea, manage your condition as told by your health care provider.  Do not drink alcohol.  Do not drink beverages that contain caffeine, such as coffee, soda, and tea.  Maintain a healthy weight. Do not use diet pills unless your health care provider approves. Diet pills may make heart problems worse.  Follow diet instructions as told by your health care provider.  Exercise regularly as told by your health care provider.  Keep all follow-up visits as told by your health care provider. This is important. PREVENTION  Avoid drinking beverages that contain caffeine or alcohol.  Avoid certain medicines, especially medicines that are used for breathing problems.  Avoid certain herbs and herbal medicines, such as those that contain ephedra or ginseng.  Do not use illegal drugs, such as cocaine and amphetamines.  Do not smoke.  Manage your high blood pressure. SEEK MEDICAL CARE IF:  You notice a change in the rate, rhythm, or strength of your heartbeat.  You are taking an anticoagulant and you notice increased bruising.  You tire more easily when you exercise or exert yourself. SEEK IMMEDIATE MEDICAL CARE IF:  You have chest pain, abdominal pain,  sweating, or weakness.  You feel nauseous.  You notice blood in your vomit, bowel movement, or urine.  You have shortness of breath.  You suddenly have swollen feet and ankles.  You feel dizzy.  You have sudden weakness or numbness of the face, arm, or leg, especially on one side of the body.  You have trouble speaking, trouble understanding, or both (aphasia).  Your face or your eyelid droops on one side. These symptoms may represent a serious problem that is an emergency. Do not wait to see if the symptoms will go away. Get medical help right away. Call your local emergency services (911 in the [place] Do not drive yourself to the hospital.   This information is not intended to replace advice given to you by your health care provider. Make sure you discuss any questions you have with your health care provider.   Document Released: 08/13/2005 Document Revised: 05/04/2015 Document Reviewed: 12/08/2014 Elsevier Interactive Patient Education [employer]2016 [employer] Inc.

## 2016-04-23 ENCOUNTER — Other Ambulatory Visit: Payer: Self-pay | Admitting: Cardiology

## 2016-04-23 NOTE — Assessment & Plan Note (Signed)
Resolved. - f/u with cardiology for stress test.

## 2016-04-23 NOTE — Assessment & Plan Note (Signed)
Resolved. Continue with lower dose of Imdur, not having chest pain with lower dose.

## 2016-04-23 NOTE — Assessment & Plan Note (Signed)
No evidence of recent flare. Given an increase in allopurinol will affect coumadin dosing, will hold off on change currently and defer to PCP.

## 2016-04-23 NOTE — Assessment & Plan Note (Signed)
Currently rate controlled. On Coumadin.  - continue metoprolol and coumadin.

## 2016-04-24 ENCOUNTER — Encounter: Payer: Self-pay | Admitting: Family Medicine

## 2016-04-27 ENCOUNTER — Other Ambulatory Visit: Payer: Self-pay | Admitting: Cardiology

## 2016-04-27 ENCOUNTER — Ambulatory Visit (INDEPENDENT_AMBULATORY_CARE_PROVIDER_SITE_OTHER): Payer: Medicaid Other | Admitting: *Deleted

## 2016-04-27 ENCOUNTER — Other Ambulatory Visit: Payer: Self-pay | Admitting: Internal Medicine

## 2016-04-27 DIAGNOSIS — Z5181 Encounter for therapeutic drug level monitoring: Secondary | ICD-10-CM

## 2016-04-27 DIAGNOSIS — I059 Rheumatic mitral valve disease, unspecified: Secondary | ICD-10-CM

## 2016-04-27 DIAGNOSIS — Z954 Presence of other heart-valve replacement: Secondary | ICD-10-CM

## 2016-04-27 DIAGNOSIS — Z952 Presence of prosthetic heart valve: Secondary | ICD-10-CM

## 2016-04-27 DIAGNOSIS — Z7901 Long term (current) use of anticoagulants: Secondary | ICD-10-CM | POA: Diagnosis not present

## 2016-04-27 LAB — POCT INR: INR: 4

## 2016-05-04 ENCOUNTER — Encounter (HOSPITAL_COMMUNITY): Payer: Medicaid Other

## 2016-05-07 ENCOUNTER — Encounter: Payer: Medicaid Other | Admitting: Physician Assistant

## 2016-05-11 ENCOUNTER — Ambulatory Visit (HOSPITAL_COMMUNITY)
Admission: RE | Admit: 2016-05-11 | Discharge: 2016-05-11 | Disposition: A | Discharge status: Home / Self Care | Payer: Medicaid Other | Source: Ambulatory Visit | Attending: Cardiology | Admitting: Cardiology

## 2016-05-11 ENCOUNTER — Encounter (HOSPITAL_COMMUNITY): Payer: Self-pay | Admitting: Cardiology

## 2016-05-11 DIAGNOSIS — I482 Chronic atrial fibrillation, unspecified: Secondary | ICD-10-CM

## 2016-05-11 LAB — LIPID PANEL
CHOLESTEROL: 177 mg/dL (ref 0–200)
HDL: 61 mg/dL (ref >40)
LDL Cholesterol: 87 mg/dL (ref 0–99)
TRIGLYCERIDES: 144 mg/dL (ref <150)
Total CHOL/HDL Ratio: 2.9 RATIO
VLDL: 29 mg/dL (ref 0–40)

## 2016-05-11 LAB — HEPATIC FUNCTION PANEL
ALT: 44 U/L (ref 14–54)
AST: 39 U/L (ref 15–41)
Albumin: 4.1 g/dL (ref 3.5–5.0)
Alkaline Phosphatase: 83 U/L (ref 38–126)
BILIRUBIN DIRECT: 0.2 mg/dL (ref 0.1–0.5)
BILIRUBIN TOTAL: 1 mg/dL (ref 0.3–1.2)
Indirect Bilirubin: 0.8 mg/dL (ref 0.3–0.9)
Total Protein: 7.3 g/dL (ref 6.5–8.1)

## 2016-05-17 ENCOUNTER — Other Ambulatory Visit: Payer: Self-pay | Admitting: Cardiology

## 2016-05-18 ENCOUNTER — Ambulatory Visit (INDEPENDENT_AMBULATORY_CARE_PROVIDER_SITE_OTHER): Payer: Medicaid Other | Admitting: *Deleted

## 2016-05-18 DIAGNOSIS — Z5181 Encounter for therapeutic drug level monitoring: Secondary | ICD-10-CM

## 2016-05-18 DIAGNOSIS — Z7901 Long term (current) use of anticoagulants: Secondary | ICD-10-CM

## 2016-05-18 DIAGNOSIS — I059 Rheumatic mitral valve disease, unspecified: Secondary | ICD-10-CM

## 2016-05-18 LAB — POCT INR: INR: 3.1

## 2016-06-04 ENCOUNTER — Ambulatory Visit (INDEPENDENT_AMBULATORY_CARE_PROVIDER_SITE_OTHER): Payer: Medicaid Other | Admitting: *Deleted

## 2016-06-04 DIAGNOSIS — Z5181 Encounter for therapeutic drug level monitoring: Secondary | ICD-10-CM | POA: Diagnosis not present

## 2016-06-04 DIAGNOSIS — I059 Rheumatic mitral valve disease, unspecified: Secondary | ICD-10-CM | POA: Diagnosis not present

## 2016-06-04 DIAGNOSIS — Z7901 Long term (current) use of anticoagulants: Secondary | ICD-10-CM

## 2016-06-04 LAB — POCT INR: INR: 4.8

## 2016-06-12 ENCOUNTER — Encounter: Payer: Self-pay | Admitting: Internal Medicine

## 2016-06-12 ENCOUNTER — Ambulatory Visit (INDEPENDENT_AMBULATORY_CARE_PROVIDER_SITE_OTHER): Payer: Medicaid Other | Admitting: Internal Medicine

## 2016-06-12 VITALS — BP 118/70 | HR 77 | Temp 97.7°F | Wt 156.0 lb

## 2016-06-12 DIAGNOSIS — M545 Low back pain, unspecified: Secondary | ICD-10-CM | POA: Insufficient documentation

## 2016-06-12 DIAGNOSIS — Z23 Encounter for immunization: Secondary | ICD-10-CM

## 2016-06-12 DIAGNOSIS — Z7901 Long term (current) use of anticoagulants: Secondary | ICD-10-CM

## 2016-06-12 LAB — POCT URINALYSIS DIPSTICK
Bilirubin, UA: NEGATIVE
GLUCOSE UA: NEGATIVE
Ketones, UA: NEGATIVE
LEUKOCYTES UA: NEGATIVE
NITRITE UA: NEGATIVE
Protein, UA: NEGATIVE
Spec Grav, UA: 1.01
UROBILINOGEN UA: 0.2
pH, UA: 5.5

## 2016-06-12 LAB — POCT UA - MICROSCOPIC ONLY

## 2016-06-12 LAB — POCT INR: INR: 2.9

## 2016-06-12 NOTE — Progress Notes (Signed)
   [facility] Phone: [phone]  Subjective:  Deborah [name] is a 65 year old female presenting to clinic with left-sided back pain for last 1-2 weeks. She describes the pain as "achy" and "stabbing" and "feeling hot". The pain does not radiate. Nothing makes the pain worse, it comes and goes on its own. Nothing makes the pain better. She has not tried any medications. She denies any trauma or straining of the area. She denies any dysuria, urinary frequency, or urinary urgency. She denies any fevers, chills, or vomiting. She denies any weakness, numbness, or tingling in her lower extremities. She denies any bowel or bladder incontinence (besides mild baseline stress incontinence) or saddle anesthesia. She states that about 10 days ago, she noticed some blood on the toilet paper after wiping. She called the office to come have her INR checked and it was 4.8. She stopped taking her Coumadin for 2 days and then restarted. She has not seen any bleeding since then.  ROS: See HPI for pertinent positives and negatives  Past Medical History- chronic A. fib "on Coumadin", chronic diastolic heart failure, mitral valve disorder, rheumatic heart disease, GERD, diabetes, hyperlipidemia  Family history reviewed for today's visit. No changes.  Social history- patient is a never smoker.  Objective: BP 118/70   Pulse 77   Temp 97.7 F (36.5 C) (Oral)   Wt 156 lb (70.8 kg)   BMI 30.47 kg/m  Gen: NAD, alert, cooperative with exam HEENT: NCAT, EOMI, MMM Neck: FROM, supple Resp: Normal work of breathing Back: No tenderness to palpation of the spinous processes of the lumbar spine. Patient has point tenderness to palpation of the left lumbar paraspinal muscles. Pain elicited with twisting back and forth. Normal range of motion of the lumbar spine. No CVA tenderness Neuro: Awake, alert, and oriented. 5/5 muscle strength in the lower extremities bilaterally. Sensation intact to light touch.  Reflexes are symmetric. Normal gait.  Assessment/Plan: Low Back Pain: Patient has had low back pain for the last 1-2 weeks. She does not remember injuring her back. Likely muscle strain, given that she has a focal area of tenderness over her left paraspinal muscles and increased pain with twisting back and forth. Pyelonephritis less likely without CVA tenderness, fevers, or dysuria. Nephrolithiasis less likely, as patient is not having severe pain and patient has not seen any blood in her urine. Another consideration is a retroperitoneal bleed given her long-term Coumadin use and recent elevated INR. - Will treat conservatively for muscle strain with heat and Tylenol. - Will check an INR today. If elevated, will need to consider imaging to rule out retroperitoneal bleed. - Will also check a urinalysis to look for RBCs that may indicate nephrolithiasis. - Return precautions discussed - Advised patient to follow up in 2 weeks if pain is not improving. - Precepted with Dr. Deirdre Priesthambliss.   Willadean CarolKaty Keatyn Jawad, MD PGY-2

## 2016-06-12 NOTE — Assessment & Plan Note (Signed)
Patient has had low back pain for the last 1-2 weeks. She does not remember injuring her back. Likely muscle strain, given that she has a focal area of tenderness over her left paraspinal muscles and increased pain with twisting back and forth. Pyelonephritis less likely without CVA tenderness, fevers, or dysuria. Nephrolithiasis less likely, as patient is not having severe pain and patient has not seen any blood in her urine. Another consideration is a retroperitoneal bleed given her long-term Coumadin use and recent elevated INR. - Will treat conservatively for muscle strain with heat and Tylenol. - Will check an INR today. If elevated, will need to consider imaging to rule out retroperitoneal bleed. - Will also check a urinalysis to look for RBCs that may indicate nephrolithiasis. - Return precautions discussed - Advised patient to follow up in 2 weeks if pain is not improving. - Precepted with Dr. Deirdre Priesthambliss.

## 2016-06-12 NOTE — Patient Instructions (Signed)
It was so nice to meet you!  I think you have strained a muscle in your back. I would recommend using Tylenol and heat to the area. If you notice that the pain gets worse or if it is not better in 2 weeks, please come back to see us!  -Dr. Nancy MarusMayo

## 2016-06-15 ENCOUNTER — Ambulatory Visit (INDEPENDENT_AMBULATORY_CARE_PROVIDER_SITE_OTHER): Payer: Medicaid Other

## 2016-06-15 DIAGNOSIS — I059 Rheumatic mitral valve disease, unspecified: Secondary | ICD-10-CM

## 2016-06-15 DIAGNOSIS — Z5181 Encounter for therapeutic drug level monitoring: Secondary | ICD-10-CM

## 2016-06-26 ENCOUNTER — Other Ambulatory Visit: Payer: Self-pay | Admitting: Family Medicine

## 2016-06-28 ENCOUNTER — Telehealth: Payer: Self-pay | Admitting: Cardiology

## 2016-06-28 NOTE — Telephone Encounter (Signed)
New message  Pt call stating she received a call from cardiology office. I did not see a telephone message. Please call back to discuss if needed

## 2016-07-03 ENCOUNTER — Ambulatory Visit (INDEPENDENT_AMBULATORY_CARE_PROVIDER_SITE_OTHER): Payer: Medicaid Other | Admitting: *Deleted

## 2016-07-03 DIAGNOSIS — Z7901 Long term (current) use of anticoagulants: Secondary | ICD-10-CM

## 2016-07-03 DIAGNOSIS — Z952 Presence of prosthetic heart valve: Secondary | ICD-10-CM | POA: Diagnosis not present

## 2016-07-03 DIAGNOSIS — Z5181 Encounter for therapeutic drug level monitoring: Secondary | ICD-10-CM | POA: Diagnosis not present

## 2016-07-03 DIAGNOSIS — I059 Rheumatic mitral valve disease, unspecified: Secondary | ICD-10-CM | POA: Diagnosis not present

## 2016-07-03 LAB — POCT INR: INR: 3.5

## 2016-07-14 ENCOUNTER — Other Ambulatory Visit: Payer: Self-pay | Admitting: Family Medicine

## 2016-07-23 ENCOUNTER — Ambulatory Visit (INDEPENDENT_AMBULATORY_CARE_PROVIDER_SITE_OTHER): Payer: Medicaid Other | Admitting: *Deleted

## 2016-07-23 DIAGNOSIS — Z5181 Encounter for therapeutic drug level monitoring: Secondary | ICD-10-CM | POA: Diagnosis not present

## 2016-07-23 DIAGNOSIS — Z7901 Long term (current) use of anticoagulants: Secondary | ICD-10-CM

## 2016-07-23 DIAGNOSIS — I059 Rheumatic mitral valve disease, unspecified: Secondary | ICD-10-CM

## 2016-07-23 DIAGNOSIS — Z952 Presence of prosthetic heart valve: Secondary | ICD-10-CM | POA: Diagnosis not present

## 2016-07-23 LAB — POCT INR: INR: 3.6

## 2016-08-10 ENCOUNTER — Other Ambulatory Visit: Payer: Self-pay | Admitting: Cardiology

## 2016-08-13 ENCOUNTER — Ambulatory Visit (INDEPENDENT_AMBULATORY_CARE_PROVIDER_SITE_OTHER): Payer: Medicaid Other | Admitting: *Deleted

## 2016-08-13 DIAGNOSIS — Z5181 Encounter for therapeutic drug level monitoring: Secondary | ICD-10-CM

## 2016-08-13 DIAGNOSIS — I059 Rheumatic mitral valve disease, unspecified: Secondary | ICD-10-CM | POA: Diagnosis not present

## 2016-08-13 DIAGNOSIS — Z7901 Long term (current) use of anticoagulants: Secondary | ICD-10-CM | POA: Diagnosis not present

## 2016-08-13 LAB — POCT INR: INR: 2.8

## 2016-08-14 ENCOUNTER — Ambulatory Visit (HOSPITAL_BASED_OUTPATIENT_CLINIC_OR_DEPARTMENT_OTHER): Payer: Medicaid Other | Admitting: Hematology

## 2016-08-14 ENCOUNTER — Encounter: Payer: Self-pay | Admitting: Hematology

## 2016-08-14 ENCOUNTER — Other Ambulatory Visit (HOSPITAL_BASED_OUTPATIENT_CLINIC_OR_DEPARTMENT_OTHER): Payer: Medicaid Other

## 2016-08-14 ENCOUNTER — Telehealth: Payer: Self-pay | Admitting: Hematology

## 2016-08-14 VITALS — BP 116/74 | HR 70 | Temp 97.7°F | Resp 18 | Ht 60.0 in | Wt 158.3 lb

## 2016-08-14 DIAGNOSIS — D582 Other hemoglobinopathies: Secondary | ICD-10-CM

## 2016-08-14 DIAGNOSIS — R109 Unspecified abdominal pain: Secondary | ICD-10-CM | POA: Diagnosis not present

## 2016-08-14 LAB — COMPREHENSIVE METABOLIC PANEL
ALBUMIN: 3.9 g/dL (ref 3.5–5.0)
ALK PHOS: 90 U/L (ref 40–150)
ALT: 34 U/L (ref 0–55)
AST: 29 U/L (ref 5–34)
Anion Gap: 8 mEq/L (ref 3–11)
BILIRUBIN TOTAL: 1.38 mg/dL — AB (ref 0.20–1.20)
BUN: 17.2 mg/dL (ref 7.0–26.0)
CO2: 29 mEq/L (ref 22–29)
Calcium: 9.3 mg/dL (ref 8.4–10.4)
Chloride: 105 mEq/L (ref 98–109)
Creatinine: 1.1 mg/dL (ref 0.6–1.1)
EGFR: 54 mL/min/{1.73_m2} — AB (ref >90)
GLUCOSE: 141 mg/dL — AB (ref 70–140)
Potassium: 4.1 mEq/L (ref 3.5–5.1)
SODIUM: 142 meq/L (ref 136–145)
TOTAL PROTEIN: 7.4 g/dL (ref 6.4–8.3)

## 2016-08-14 LAB — CBC & DIFF AND RETIC
BASO%: 0.9 % (ref 0.0–2.0)
Basophils Absolute: 0.1 10*3/uL (ref 0.0–0.1)
EOS%: 4.1 % (ref 0.0–7.0)
Eosinophils Absolute: 0.3 10*3/uL (ref 0.0–0.5)
HCT: 36.7 % (ref 34.8–46.6)
HGB: 11.7 g/dL (ref 11.6–15.9)
Immature Retic Fract: 13.8 % — ABNORMAL HIGH (ref 1.60–10.00)
LYMPH#: 2.6 10*3/uL (ref 0.9–3.3)
LYMPH%: 40.8 % (ref 14.0–49.7)
MCH: 23.5 pg — AB (ref 25.1–34.0)
MCHC: 31.9 g/dL (ref 31.5–36.0)
MCV: 73.7 fL — AB (ref 79.5–101.0)
MONO#: 0.5 10*3/uL (ref 0.1–0.9)
MONO%: 7.7 % (ref 0.0–14.0)
NEUT#: 3 10*3/uL (ref 1.5–6.5)
NEUT%: 46.5 % (ref 38.4–76.8)
PLATELETS: 133 10*3/uL — AB (ref 145–400)
RBC: 4.98 10*6/uL (ref 3.70–5.45)
RDW: 15.6 % — ABNORMAL HIGH (ref 11.2–14.5)
Retic %: 2.92 % — ABNORMAL HIGH (ref 0.70–2.10)
Retic Ct Abs: 145.42 10*3/uL — ABNORMAL HIGH (ref 33.70–90.70)
WBC: 6.4 10*3/uL (ref 3.9–10.3)

## 2016-08-14 NOTE — Progress Notes (Signed)
[name]  [name], MD [address] [place] [address]  DIAGNOSIS: Hemoglobin E disease (HCC) - Plan: US Abdomen Limited  CHIEF COMPLAIN: follow up    INTERVAL HISTORY: Deborah [name] 65 y.o. 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   She was last seen by me 12 months ago.  She has been doing well overall, has mild fatigue, able to function very well. She had a few episodes of chest pain earlier this year, and was seen by her cardiologist at [facility]. She was hospitalized in the summer for diarrhea and dehydration. No other major medical events in the past year. She complains of mild intermittent right upper quadrant pain, not sure if it is related to diet, usually results onset on. No fever or chills.  MEDICAL HISTORY: Past Medical History:  Diagnosis Date  . Allergic rhinitis   . Anemia   . Borderline diabetes   . Carpal tunnel syndrome   . Chronic atrial fibrillation (HCC)    coumadin managed by [facility]. CVRR;  Event montor (7/12-8/12) showed no high rate episodes (patient continuously in atrial fibrillation).   . Chronic diastolic heart failure (HCC)   . Eosinophilia 01/27/2013  . External hemorrhoid   . External hemorrhoids   . Fatigue   . Gout   . 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 Gaylyn Rong  . Hx of cardiovascular stress test    ETT-myoview (7/12): No evidence for ischemia or infarction  . Hyperlipidemia   . Hypertension   . Obesity   . Rheumatic heart disease    Status post mechanical (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  Rotator cuff syndrome     INTERIM HISTORY: has Hyperlipidemia associated with type 2 diabetes mellitus (HCC); GOUT; CARPAL TUNNEL SYNDROME, LEFT; TRICUSPID REGURGITATION, MODERATE WITH MILD PULMONARY HTN; GERD; HYPERBILIRUBINEMIA; HYPERGLYCEMIA; Mitral valve disorder; Rheumatic heart disease; Chronic atrial fibrillation (HCC); Chronic anticoagulation; DM (diabetes mellitus) (HCC); Hyperlipidemia; Diastolic CHF, chronic (HCC); Microcytic anemia; CAP (community acquired pneumonia); Hypotension; Eosinophilia; Encounter for therapeutic drug monitoring; Disorders of both mitral and tricuspid valves; Post cardiotomy syndrome; H/O prosthetic heart valve; Postprocedural state; Chest pain; Right shoulder pain; Bacterial upper respiratory infection; Acute bronchitis; Hemoglobin E disease (HCC); Abdominal pain, right upper quadrant; Viral URI; Vision changes; Dehydration; Nonspecific chest pain; and Low back pain without sciatica on her problem list.    ALLERGIES:  is allergic to promethazine hcl.  MEDICATIONS: has a current medication list which includes the following prescription(s): accu-chek aviva plus, acetaminophen, allopurinol, aspirin, cyanocobalamin, cyclosporine, diazepam, diclofenac sodium, folic acid, isosorbide mononitrate, loratadine, menthol-methyl salicylate, metformin, metoprolol succinate, olopatadine hcl, omeprazole, potassium chloride sa, simvastatin, torsemide, warfarin, meclizine, and nitroglycerin.  SURGICAL HISTORY:  Past Surgical History:  Procedure Laterality Date  . CARDIAC CATHETERIZATION     EF 55%  . TRICUSPID VALVE REPAIR      REVIEW OF SYSTEMS:   Constitutional: Denies fevers, chills or abnormal weight loss Eyes: Denies blurriness of vision Ears, nose, mouth, throat, and face: Denies mucositis or sore throat Respiratory: Denies cough, dyspnea  or wheezes Cardiovascular: Denies palpitation, chest discomfort or lower extremity swelling Gastrointestinal:  Denies nausea,  heartburn or change in bowel habits Skin: Denies abnormal skin rashes Lymphatics: Denies new lymphadenopathy or easy bruising Neurological:Denies numbness, tingling or new weaknesses Behavioral/Psych: Mood is stable, no new changes  All other systems were reviewed with the patient and are negative.  PHYSICAL EXAMINATION: ECOG PERFORMANCE STATUS: 1 - Symptomatic but completely ambulatory  Blood pressure 116/74, pulse 70, temperature 97.7 F (36.5 C), temperature source Oral, resp. rate 18, height 5' (1.524 m), weight 158 lb 4.8 oz (71.8 kg), SpO2 96 %.  GENERAL:alert, no distress and comfortable SKIN: skin color, texture, turgor are normal, no rashes or significant lesions EYES: normal, Conjunctiva are pink and non-injected, sclera clear OROPHARYNX:no exudate, no erythema and lips, buccal mucosa, and tongue normal  NECK: supple, thyroid normal size, non-tender, without nodularity LYMPH:  no palpable lymphadenopathy in the cervical, axillary or supraclavicular LUNGS: clear to auscultation with normal breathing effort, no wheezes or rhonchi HEART: regular rate & rhythm and no murmurs and no lower extremity edema ABDOMEN:abdomen soft, mild tenderness in the right upper quadrant abdomen, no hepatomegaly, normal bowel sounds Musculoskeletal:no cyanosis of digits and no clubbing  NEURO: alert & oriented x 3 with fluent speech, no focal motor/sensory deficits  Labs:   CBC Latest Ref Rng & Units 08/14/2016 04/14/2016 04/13/2016  WBC 3.9 - 10.3 10e3/uL 6.4 6.5 11.2(H)  Hemoglobin 11.6 - 15.9 g/dL 93.211.7 11.9(L) 11.0(L)  Hematocrit 34.8 - 46.6 % 36.7 37.7 35.0(L)  Platelets 145 - 400 10e3/uL 133(L) 140(L) 132(L)    CMP Latest Ref Rng & Units 08/14/2016 05/11/2016 04/20/2016  Glucose 70 - 140 mg/dl 355(D141(H) - 322(G109(H)  BUN 7.0 - 26.0 mg/dL 25.417.2 - 20  Creatinine 0.6 - 1.1 mg/dL 1.1 - 2.70(W1.07(H)  Sodium 237136 - 145 mEq/L 142 - 141  Potassium 3.5 - 5.1 mEq/L 4.1 - 3.8  Chloride 98 - 110 mmol/L - - 103   CO2 22 - 29 mEq/L 29 - 26  Calcium 8.4 - 10.4 mg/dL 9.3 - 9.4  Total Protein 6.4 - 8.3 g/dL 7.4 7.3 -  Total Bilirubin 0.20 - 1.20 mg/dL 6.28(B1.38(H) 1.0 -  Alkaline Phos 40 - 150 U/L 90 83 -  AST 5 - 34 U/L 29 39 -  ALT 0 - 55 U/L 34 44 -     RADIOGRAPHIC STUDIES: No results found.  ASSESSMENT: Deborah [name] 65 y.o. female with a history of Hemoglobin E disease (HCC) - Plan: US Abdomen Limited   PLAN:   1. Hemoglobin E and Constant Spring:  - stable. 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 No transfusion is indicated. -I recommend her to continue B12 and folic acid supplement due to her mild hemolysis process. -We discussed this is a genetic disorder with autosomal dominant pattern, her children should be screened also.  -monitor her CBC every 6 month.  2. Right upper abdominal pain  -She has had a few episodes of right upper abdominal pain, mild tenderness on exam -I'll obtain a abdominal ultrasound to ruled out gallbladder stone or lesions. She last US showed fatty liver in 2016. Due to her chronic mild hemolysis, she is at risk for gallbladder stone.  3. Elevated bili -Secondary to mild hemolysis  -Ultram often abdomen in May 2016 showed Fatty liver.  -I encouraged her to continue exercise, and try to lose some weight. She is on cholesterol pill.  4. Cancer screening -she is doing annual screening  mammogram -She has not had screening colonoscopy, no family history of colon cancer. I recommend her to consider, she is still reluctant to do this the fact that she is on Coumadin. She will discuss with her primary care physician.  Plan:   return to clinic in one year with repeated CBC Abdominal US within next month, she will call me after the scan and I'll review the results with her over the phone.   All questions were answered. The patient knows to call the clinic with any problems, questions or concerns. We can certainly see the patient much  sooner if necessary.  I spent 20 minutes counseling the patient face to face. The total time spent in the appointment was 25 minutes.    Malachy MoodFeng, Lautaro Koral, MD 08/14/16 10:08 AM

## 2016-08-14 NOTE — Telephone Encounter (Signed)
Gave patient avs report and appointments for November 2018. [facility] will call re US.

## 2016-08-24 ENCOUNTER — Other Ambulatory Visit: Payer: Self-pay | Admitting: Family Medicine

## 2016-08-28 ENCOUNTER — Other Ambulatory Visit (HOSPITAL_COMMUNITY): Payer: Self-pay | Admitting: *Deleted

## 2016-08-28 MED ORDER — METOPROLOL SUCCINATE ER 50 MG PO TB24: 50.0000 mg | ORAL_TABLET | Freq: Every day | ORAL | 3 refills | Status: DC

## 2016-09-03 ENCOUNTER — Other Ambulatory Visit: Payer: Self-pay | Admitting: *Deleted

## 2016-09-03 ENCOUNTER — Ambulatory Visit (HOSPITAL_COMMUNITY)
Admission: RE | Admit: 2016-09-03 | Discharge: 2016-09-03 | Disposition: A | Discharge status: Home / Self Care | Payer: Medicaid Other | Source: Ambulatory Visit | Attending: Hematology | Admitting: Hematology

## 2016-09-03 DIAGNOSIS — K760 Fatty (change of) liver, not elsewhere classified: Secondary | ICD-10-CM | POA: Diagnosis not present

## 2016-09-03 DIAGNOSIS — D582 Other hemoglobinopathies: Secondary | ICD-10-CM

## 2016-09-03 NOTE — Progress Notes (Signed)
[facility]  Patient here now.  We have questions about the order.  For abdomenal US to check spleen, it needs to be complete."  Order entered for provider signature.

## 2016-09-05 ENCOUNTER — Encounter: Payer: Self-pay | Admitting: Student

## 2016-09-05 ENCOUNTER — Telehealth: Payer: Self-pay | Admitting: *Deleted

## 2016-09-05 ENCOUNTER — Ambulatory Visit (INDEPENDENT_AMBULATORY_CARE_PROVIDER_SITE_OTHER): Payer: Medicaid Other | Admitting: Student

## 2016-09-05 ENCOUNTER — Telehealth (HOSPITAL_COMMUNITY): Payer: Self-pay

## 2016-09-05 VITALS — BP 110/70 | HR 87 | Temp 97.5°F | Wt 156.0 lb

## 2016-09-05 DIAGNOSIS — B9789 Other viral agents as the cause of diseases classified elsewhere: Secondary | ICD-10-CM

## 2016-09-05 DIAGNOSIS — Z23 Encounter for immunization: Secondary | ICD-10-CM | POA: Diagnosis not present

## 2016-09-05 DIAGNOSIS — J069 Acute upper respiratory infection, unspecified: Secondary | ICD-10-CM

## 2016-09-05 DIAGNOSIS — Z0000 Encounter for general adult medical examination without abnormal findings: Secondary | ICD-10-CM | POA: Diagnosis not present

## 2016-09-05 DIAGNOSIS — R531 Weakness: Secondary | ICD-10-CM

## 2016-09-05 DIAGNOSIS — I5032 Chronic diastolic (congestive) heart failure: Secondary | ICD-10-CM

## 2016-09-05 DIAGNOSIS — E118 Type 2 diabetes mellitus with unspecified complications: Secondary | ICD-10-CM | POA: Diagnosis not present

## 2016-09-05 NOTE — Progress Notes (Signed)
   Subjective:    Patient ID: Deborah [name], female    DOB: 07/27/1951, 66 y.o.   MRN: [id number]   CC: Weakness, cough  HPI: 66 y/o presents for cough, weakness and fatigue  Cough - present for the last two weeks - she feels it is improving but it continues to be bothersome - is it productive of clear sputum - denies fever, SOB, chest pain  Weakness/fatigue - noted it most over the last two days - denies difficulty ambulating or performing ADLs but feels more tired than usual  Heart failure - denies SOB, LE swelling - reports compliance with torsemide  Smoking status reviewed Non smoker  Review of Systems  Per HPI, else denies N/V/D, abdominal pain   Objective:  BP 110/70   Pulse 87   Temp 97.5 F (36.4 C) (Oral)   Wt 156 lb (70.8 kg)   SpO2 97%   BMI 30.47 kg/m  Vitals and nursing note reviewed  General: NAD HEENT: non erythematous oropharynx with no lesions or exudate, TMs wnl bilaterally Cardiac: RRR Respiratory: CTAB, normal effort Extremities: no LE edema or cyanosis. WWP. Skin: warm and dry, no rashes noted Neuro: alert and oriented, 5/5 UE and LE strength bilateral, normal gait   Assessment & Plan:    Viral URI Continued viral type symptoms with no red flag symptoms on history or exam. Fatigue likely due to viral syndrome. - will evaluate for possible EBV infection at next visit if not improving - continue conservative management - will try OTC treatment, [name] Alka seltzer cold and flu - will return in 1 week if not improveing  Diastolic CHF, chronic (HCC) No evidence of CHF exacerbation, appears euvolemic - will obtain BMP to assess electrolytes per patient's request    Deborah Gonsalves A. Kennon RoundsHaney MD, MS Family Medicine Resident PGY-3 Pager [phone]

## 2016-09-05 NOTE — Telephone Encounter (Signed)
Notified daughter of negative US other than fatty liver & no reason seen for RUQ abd pain.  Instructed to let her PCP know if she is still having pain for further f/u.  Daughter, expressed understanding & appreciation for call.

## 2016-09-05 NOTE — Assessment & Plan Note (Signed)
Continued viral type symptoms with no red flag symptoms on history or exam. Fatigue likely due to viral syndrome. - will evaluate for possible EBV infection at next visit if not improving - continue conservative management - will try OTC treatment, like Alka seltzer cold and flu - will return in 1 week if not improveing

## 2016-09-05 NOTE — Telephone Encounter (Signed)
Patient's daughter called c/o SOB, fatigue, cough, and nauseated since christmas time. Weight stable, denies fever, chills, sweats. Patient being seen by urgent care or PCP today. Will add on to Dr. Alford HighlandMcLean's schedule for a sooner apt tomorrow morning as there is a cancellation opening. Advised if s/s worsen to seek emergency medical care. Aware and agreeable to plan as stated above.  Ave FilterBradley, Megan Genevea, RN

## 2016-09-05 NOTE — Patient Instructions (Addendum)
Follow up as needed Take Alka Seltzer Cold and Flu for your cold symptoms If you feel you are getting worse, call the office or follow up for evaluation If you have questions or concerns, call the office at [phone]

## 2016-09-05 NOTE — Progress Notes (Signed)
Left message on daughter's mobile vm to call back for results of US.

## 2016-09-05 NOTE — Assessment & Plan Note (Signed)
No evidence of CHF exacerbation, appears euvolemic - will obtain BMP to assess electrolytes per patient's request

## 2016-09-05 NOTE — Telephone Encounter (Signed)
-----   Message from Malachy MoodYan Feng, MD sent at 09/03/2016  7:31 PM EST ----- Please let pt know her US results. It shows fatty liver, which is not new to her. Otherwise negative, gallbladder looks fine on US, not sure why she had RUQ abdominal pain. If she still has it, please follow up with her PCP.  Malachy MoodFeng, Yan  09/03/2016

## 2016-09-06 ENCOUNTER — Encounter: Payer: Self-pay | Admitting: Student

## 2016-09-06 ENCOUNTER — Encounter (HOSPITAL_COMMUNITY): Payer: Self-pay

## 2016-09-06 ENCOUNTER — Ambulatory Visit (HOSPITAL_COMMUNITY)
Admission: RE | Admit: 2016-09-06 | Discharge: 2016-09-06 | Disposition: A | Discharge status: Home / Self Care | Payer: Medicaid Other | Source: Ambulatory Visit | Attending: Cardiology | Admitting: Cardiology

## 2016-09-06 VITALS — BP 110/70 | HR 81 | Wt 156.0 lb

## 2016-09-06 DIAGNOSIS — Z791 Long term (current) use of non-steroidal anti-inflammatories (NSAID): Secondary | ICD-10-CM | POA: Insufficient documentation

## 2016-09-06 DIAGNOSIS — D560 Alpha thalassemia: Secondary | ICD-10-CM | POA: Diagnosis not present

## 2016-09-06 DIAGNOSIS — I059 Rheumatic mitral valve disease, unspecified: Secondary | ICD-10-CM | POA: Diagnosis not present

## 2016-09-06 DIAGNOSIS — M109 Gout, unspecified: Secondary | ICD-10-CM | POA: Insufficient documentation

## 2016-09-06 DIAGNOSIS — E785 Hyperlipidemia, unspecified: Secondary | ICD-10-CM | POA: Diagnosis not present

## 2016-09-06 DIAGNOSIS — R7301 Impaired fasting glucose: Secondary | ICD-10-CM | POA: Diagnosis not present

## 2016-09-06 DIAGNOSIS — D649 Anemia, unspecified: Secondary | ICD-10-CM | POA: Insufficient documentation

## 2016-09-06 DIAGNOSIS — Z79899 Other long term (current) drug therapy: Secondary | ICD-10-CM | POA: Diagnosis not present

## 2016-09-06 DIAGNOSIS — Z7982 Long term (current) use of aspirin: Secondary | ICD-10-CM | POA: Insufficient documentation

## 2016-09-06 DIAGNOSIS — Z7984 Long term (current) use of oral hypoglycemic drugs: Secondary | ICD-10-CM | POA: Insufficient documentation

## 2016-09-06 DIAGNOSIS — I5032 Chronic diastolic (congestive) heart failure: Secondary | ICD-10-CM | POA: Insufficient documentation

## 2016-09-06 DIAGNOSIS — I130 Hypertensive heart and chronic kidney disease with heart failure and stage 1 through stage 4 chronic kidney disease, or unspecified chronic kidney disease: Secondary | ICD-10-CM | POA: Diagnosis not present

## 2016-09-06 DIAGNOSIS — I482 Chronic atrial fibrillation, unspecified: Secondary | ICD-10-CM

## 2016-09-06 DIAGNOSIS — N189 Chronic kidney disease, unspecified: Secondary | ICD-10-CM | POA: Diagnosis not present

## 2016-09-06 DIAGNOSIS — Z952 Presence of prosthetic heart valve: Secondary | ICD-10-CM | POA: Diagnosis not present

## 2016-09-06 DIAGNOSIS — Z7901 Long term (current) use of anticoagulants: Secondary | ICD-10-CM | POA: Diagnosis not present

## 2016-09-06 DIAGNOSIS — K644 Residual hemorrhoidal skin tags: Secondary | ICD-10-CM | POA: Diagnosis not present

## 2016-09-06 LAB — BASIC METABOLIC PANEL WITH GFR
BUN: 23 mg/dL (ref 7–25)
CHLORIDE: 105 mmol/L (ref 98–110)
CO2: 29 mmol/L (ref 20–31)
Calcium: 9.5 mg/dL (ref 8.6–10.4)
Creat: 1.09 mg/dL — ABNORMAL HIGH (ref 0.50–0.99)
GFR, EST AFRICAN AMERICAN: 62 mL/min (ref >=60)
GFR, EST NON AFRICAN AMERICAN: 54 mL/min — AB (ref >=60)
Glucose, Bld: 114 mg/dL — ABNORMAL HIGH (ref 65–99)
POTASSIUM: 3.7 mmol/L (ref 3.5–5.3)
Sodium: 143 mmol/L (ref 135–146)

## 2016-09-06 LAB — TSH: TSH: 1.758 u[IU]/mL (ref 0.350–4.500)

## 2016-09-06 MED ORDER — METOPROLOL SUCCINATE ER 50 MG PO TB24: ORAL_TABLET | ORAL | 3 refills | Status: DC

## 2016-09-06 MED ORDER — HYDROCODONE-CHLORPHENIRAMINE 5-4 MG/5ML PO SOLN: 5.0000 mL | Freq: Two times a day (BID) | ORAL | 0 refills | Status: DC

## 2016-09-06 NOTE — Patient Instructions (Signed)
Increase Metoprolol to 75mg  (1.5 tablets) daily.  Your provider requests you have an echocardiogram before your next follow up appointment.  Follow up with Dr.McLean in 4 months.  Routine lab work today. Will notify you of abnormal results

## 2016-09-07 ENCOUNTER — Other Ambulatory Visit: Payer: Self-pay | Admitting: Internal Medicine

## 2016-09-07 NOTE — Progress Notes (Signed)
Patient ID: Deborah [name], female   DOB: 10/24/1950, 66 y.o.   MRN: [id number] PCP: Dr. Beryle FlockBacigalupo Cardiology: Dr. Shirlee LatchMcLean  66 yo with rheumatic heart disease and MV replacement/TV repair, chronic atrial fibrillation, and chronic diastolic CHF.   No chest pain. Her breathing is ok, no dyspnea walking on flat ground if she does not try to rush.  +Dyspnea with walking up stairs. No BRBPR or melena.  Chronically sleeps on 2 pillows.  Weight is down 4 lbs.  General fatigue. She occasionally feels palpitations.   Labs (8/12): K 3.5, creatinine 0.98 Labs (10/12): K 3.8, creatinine 1.0, BNP 116 Labs (1/13): K 3.6, creatinine 0.9, LDL 85, HDL 62 Labs (9/13): K 3.9, creatinine 1.0 Labs (4/14): HCT 37.8 with elevated eosinophils, BNP 1094 => 82, K 3.6, creatinine 1.0 Labs (9/14): K 3.6, creatinine 0.89, hemoglobin 11 Labs (1/15): K 3.7, creatinine 0.9, HCT 36.9 Labs (2/15): LDL 99, HDL 50 Labs (3/15): K 4, creatinine 1.28 Labs (6/15): K 3.7, creatinine 1.0, BNP 124 => 80 Labs (02/22/14): K 3.5 Creatinine 0.9 Labs (10/15): K 3.3, creatinine 0.94, HCT 38.9 Labs (6/16): K 3.9, creatinine 1.1, HCT 36.4 Labs (9/16): K 3.9, creatinine 1.61, HCT 38.5 Labs (12/16): HCT 39.4, K 4.2, creatinine 1.1 Labs (1/17): K 4.2, creatinine 1.01, BNP 109, LDL 120, HDL 70 Labs (3/17): K 3.9, creatinine 1.02 Labs (4/17): K 4, creatinine 0.98, BNP 83 Labs (9/17): LDL 87, HDL 61 Labs (12/17): HCT 36.7 Labs (1/18): K 3.7, creatinine 1.09  PMH: 1. Rheumatic heart disease: Status [facility].  Echo 5/12 with EF 60-65%, mild AI, mechanical MV prosthesis with normal function, severe LAE, moderate RAE, moderate to severe TR, mildly elevated PA systolic pressure.  Echo (6/15) EF 55-60%, normal RV, normal mechanical mitral valve, severe LAE, moderate TR.  Echo (9/16) with EF 55-60%, mild to moderate AI, mechanical mitral valve looks ok, s/p TV repair with mild TR, PASP 40.   2. Chronic atrial fibrillation on coumadin.  Event monitor (7/12-8/12) showed no high rate episodes (patient continuously in atrial fibrillation).  3. Chronic diastolic CHF 4. ETT-myoview (7/12): No evidence for ischemia or infarction.  5. Impaired fasting glucose 6. Hyperlipidemia  7. HTN 8. Gout 9. Anemia 10. External hemorrhoids.  11. 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  13. CKD  SH: Nonsmoker. [place] in [place]   FH: No premature CAD  ROS: All systems reviewed and negative except as noted in HPI.    Current Outpatient Prescriptions  Medication Sig Dispense Refill  . ACCU-CHEK AVIVA PLUS test strip USE AS INSTRUCTED TO TEST BLOOD GLUCOSE TWICE A DAY 100 each 12  . acetaminophen (TYLENOL) 500 MG tablet Take 500-1,000 mg by mouth every 6 (six) hours as needed for fever (pain).    Marland Kitchen. aspirin 81 MG chewable tablet Chew 81 mg by mouth daily.    . cyanocobalamin 500 MCG tablet Take 1 tablet (500 mcg total) by mouth daily. 30 tablet 5  . cycloSPORINE (RESTASIS) 0.05 % ophthalmic emulsion Place 1 drop into both eyes 2 (two) times daily.    . diazepam (VALIUM) 5 MG tablet TAKE 1 TABLET BY MOUTH EVERY 8 HOURS AS NEEDED FOR ANXIETY    . diclofenac sodium (VOLTAREN) 1 % GEL Apply 2 g topically 4 (four) times daily. (Patient taking differently: Apply 2 g topically 4 (four) times daily as needed (pain). ) 100 g 3  .  folic acid (FOLVITE) 1 MG tablet TAKE ONE TABLET BY MOUTH EVERY DAY 30 tablet 3  . isosorbide mononitrate (IMDUR) 30 MG 24 hr tablet Take 0.5 tablets (15 mg total) by mouth daily. 30 tablet 0  . loratadine (CLARITIN) 10 MG tablet Take 10 mg by mouth daily as needed for allergies.     Marland Kitchen meclizine (ANTIVERT) 25 MG tablet Take 1 tablet (25 mg total) by mouth 3 (three) times daily as needed for dizziness. 30 tablet 0  . Menthol-Methyl Salicylate (TIGER BALM LINIMENT EX) Apply 1 application topically daily.    .  metFORMIN (GLUCOPHAGE) 500 MG tablet TAKE TWO TABLETS BY MOUTH TWICE DAILY WITH A MEAL 120 tablet 2  . metoprolol succinate (TOPROL-XL) 50 MG 24 hr tablet Take 75mg  (1.5 tablets) daily. 45 tablet 3  . Olopatadine HCl (PAZEO) 0.7 % SOLN Place 1 drop into both eyes daily as needed (itching/ irritation).    Marland Kitchen omeprazole (PRILOSEC) 20 MG capsule TAKE ONE CAPSULE BY MOUTH EVERY DAY 30 capsule 3  . potassium chloride SA (K-DUR,KLOR-CON) 20 MEQ tablet TAKE THREE TABLETS BY MOUTH TWICE DAILY 240 tablet 3  . simvastatin (ZOCOR) 10 MG tablet Take 1 tablet (10 mg total) by mouth daily. 30 tablet 6  . torsemide (DEMADEX) 20 MG tablet TAKE 4 TABLETS BY MOUTH TWICE DAILY 240 tablet 3  . allopurinol (ZYLOPRIM) 300 MG tablet TAKE ONE TABLET BY MOUTH EVERY DAY 30 tablet 3  . Hydrocodone-Chlorpheniramine 5-4 MG/5ML SOLN Take 5 mLs by mouth every 12 (twelve) hours. 480 mL 0  . nitroGLYCERIN (NITROSTAT) 0.4 MG SL tablet Place 0.4 mg under the tongue every 5 (five) minutes as needed for chest pain.    Marland Kitchen warfarin (COUMADIN) 2 MG tablet TAKE ONE TABLET BY MOUTH EVERY DAY EXCEPT ONE-HALF TABLET ON MONDAY AND WEDNESDAY OR AS DIRECTED 80 tablet 3   No current facility-administered medications for this encounter.     BP 110/70   Pulse 81   Wt 156 lb (70.8 kg)   SpO2 98%   BMI 30.47 kg/m  General: NAD Neck: JVP 7 cm, no thyromegaly or thyroid nodule.  Lungs: Clear to auscultation bilaterally with normal respiratory effort. CV: Nondisplaced PMI.  Heart irregular S1/S2, no S3/S4, mechanical S1, 2/6 HSM LLSB.  No peripheral edema.  No carotid bruit.  Normal pedal pulses.  Abdomen: Soft, nontender, no hepatosplenomegaly, no distention.  Neurologic: Alert and oriented x 3.  Psych: Normal affect. Extremities: No clubbing or cyanosis.   Assessment/Plan: 1. Atrial fibrillation: Chronic.  Continue warfarin.  Occasional palpitations when rate increases. Will increase Toprol XL to 75 mg daily to try to smooth out her heart  rate.     2. Mechanical mitral valve: Continue ASA/coumadin, INR goal 2.5-3.5.  Will need bridging with heparin/Lovenox if has to come off coumadin.   3. Diastolic CHF:  Volume status appears ok today.  NYHA class II symptoms.  Prominent fatigue. - Continue torsemide 80 mg bid.  - Will get repeat echo to reassess EF and mechanical mitral valve.     4. Hyperlipidemia: Good lipids in 9/17.  Check TSH given fatigue.    Followup in 4 months.   Marca Ancona 09/07/2016

## 2016-09-10 ENCOUNTER — Ambulatory Visit (INDEPENDENT_AMBULATORY_CARE_PROVIDER_SITE_OTHER): Payer: Medicaid Other

## 2016-09-10 DIAGNOSIS — Z5181 Encounter for therapeutic drug level monitoring: Secondary | ICD-10-CM | POA: Diagnosis not present

## 2016-09-10 DIAGNOSIS — I059 Rheumatic mitral valve disease, unspecified: Secondary | ICD-10-CM | POA: Diagnosis not present

## 2016-09-10 LAB — POCT INR: INR: 3.1

## 2016-09-20 ENCOUNTER — Telehealth (HOSPITAL_COMMUNITY): Payer: Self-pay | Admitting: Cardiology

## 2016-09-20 NOTE — Telephone Encounter (Signed)
I called patient to reschedule appt for 09/28/16 with Dr. Shirlee LatchMcLean.  There was no answer or machine.  Will try to call back.

## 2016-09-21 ENCOUNTER — Other Ambulatory Visit: Payer: Self-pay | Admitting: Cardiology

## 2016-09-28 ENCOUNTER — Encounter (HOSPITAL_COMMUNITY): Payer: Medicaid Other

## 2016-10-01 ENCOUNTER — Other Ambulatory Visit: Payer: Self-pay | Admitting: *Deleted

## 2016-10-01 MED ORDER — SIMVASTATIN 10 MG PO TABS: 10.0000 mg | ORAL_TABLET | Freq: Every day | ORAL | 6 refills | Status: DC

## 2016-10-08 ENCOUNTER — Telehealth: Payer: Self-pay | Admitting: Family Medicine

## 2016-10-08 ENCOUNTER — Ambulatory Visit (INDEPENDENT_AMBULATORY_CARE_PROVIDER_SITE_OTHER): Payer: Medicaid Other

## 2016-10-08 DIAGNOSIS — Z7901 Long term (current) use of anticoagulants: Secondary | ICD-10-CM

## 2016-10-08 DIAGNOSIS — I059 Rheumatic mitral valve disease, unspecified: Secondary | ICD-10-CM | POA: Diagnosis not present

## 2016-10-08 DIAGNOSIS — Z5181 Encounter for therapeutic drug level monitoring: Secondary | ICD-10-CM

## 2016-10-08 LAB — POCT INR: INR: 3.2

## 2016-10-08 NOTE — Telephone Encounter (Signed)
Patient's son came to office stated mom needs referral to liver specialist at [facility].  [facility] is seen at [facility].  Please let them know when done. [phone].  Son states mom has been hurting more each day.

## 2016-10-08 NOTE — Telephone Encounter (Signed)
Unsure what liver issue that patient has.  Nothing on problem list and most recent LFTs wnl.  Will need appt to discuss and evaluate before referral given.  Erasmo DownerAngela M Thuy Atilano, MD, MPH PGY-3,  [facility] 10/08/2016 6:00 PM

## 2016-10-09 ENCOUNTER — Other Ambulatory Visit: Payer: Self-pay | Admitting: Family Medicine

## 2016-10-12 NOTE — Telephone Encounter (Signed)
LM for dgt to return call. Pt will need appt before referral [name], Deborah [name] [name], CMA

## 2016-10-17 NOTE — Telephone Encounter (Signed)
Appointment scheduled for 3/6 with PCP.

## 2016-10-23 NOTE — Telephone Encounter (Signed)
There is nothing in patients chart authorizing us to speak with patients son, will not return call to him. Left message for patient to return call.

## 2016-10-23 NOTE — Telephone Encounter (Signed)
Pt's son states that the pt can't wait much longer for a liver specialist. Pt is in pain and it was conformed by [facility] with labs that something is wrong with pt's liver. Son also stated [facility] confirmed it when pt had surgery there. Son is very upset and doesn't understand why pt needs to be seen here first. Son wants someone to call him. ep

## 2016-10-30 ENCOUNTER — Ambulatory Visit: Payer: Medicaid Other | Admitting: Family Medicine

## 2016-10-30 NOTE — Progress Notes (Deleted)
   Subjective:   Deborah [name] is a 66 y.o. female with a history of *** here for ***  ***  Liver issue? 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   Social History: *** smoker  Objective:  There were no vitals taken for this visit.  Gen:  66 y.o. female in NAD *** HEENT: NCAT, MMM, EOMI, PERRL, anicteric sclerae CV: RRR, no MRG, no JVD Resp: Non-labored, CTAB, no wheezes noted Abd: Soft, NTND, BS present, no guarding or organomegaly Ext: WWP, no edema MSK: Full ROM, strength intact Neuro: Alert and oriented, speech normal       Chemistry      Component Value Date/Time   NA 143 09/05/2016 1127   NA 142 08/14/2016 0927   K 3.7 09/05/2016 1127   K 4.1 08/14/2016 0927   CL 105 09/05/2016 1127   CL 108 (H) 02/12/2013 0813   CO2 29 09/05/2016 1127   CO2 29 08/14/2016 0927   BUN 23 09/05/2016 1127   BUN 17.2 08/14/2016 0927   CREATININE 1.09 (H) 09/05/2016 1127   CREATININE 1.1 08/14/2016 0927      Component Value Date/Time   CALCIUM 9.5 09/05/2016 1127   CALCIUM 9.3 08/14/2016 0927   ALKPHOS 90 08/14/2016 0927   AST 29 08/14/2016 0927   ALT 34 08/14/2016 0927   BILITOT 1.38 (H) 08/14/2016 0927      Lab Results  Component Value Date   WBC 6.4 08/14/2016   HGB 11.7 08/14/2016   HCT 36.7 08/14/2016   MCV 73.7 (L) 08/14/2016   PLT 133 (L) 08/14/2016   Lab Results  Component Value Date   TSH 1.758 09/06/2016   Lab Results  Component Value Date   HGBA1C 7.0 02/21/2016   Assessment & Plan:     Deborah [name] is a 66 y.o. female here for ***  No problem-specific Assessment & Plan notes found for this encounter.     Erasmo DownerAngela M Yony Roulston, MD MPH PGY-3,  [facility] 10/30/2016  1:59 PM

## 2016-11-06 ENCOUNTER — Ambulatory Visit (INDEPENDENT_AMBULATORY_CARE_PROVIDER_SITE_OTHER): Payer: Medicaid Other | Admitting: Pharmacist

## 2016-11-06 DIAGNOSIS — Z7901 Long term (current) use of anticoagulants: Secondary | ICD-10-CM | POA: Diagnosis not present

## 2016-11-06 DIAGNOSIS — Z5181 Encounter for therapeutic drug level monitoring: Secondary | ICD-10-CM | POA: Diagnosis not present

## 2016-11-06 DIAGNOSIS — I059 Rheumatic mitral valve disease, unspecified: Secondary | ICD-10-CM

## 2016-11-06 LAB — POCT INR: INR: 2.9

## 2016-11-19 ENCOUNTER — Other Ambulatory Visit: Payer: Self-pay | Admitting: Cardiology

## 2016-11-20 ENCOUNTER — Other Ambulatory Visit: Payer: Self-pay | Admitting: *Deleted

## 2016-11-20 MED ORDER — GLUCOSE BLOOD VI STRP: ORAL_STRIP | 12 refills | Status: DC

## 2016-12-06 ENCOUNTER — Ambulatory Visit: Payer: Medicaid Other | Admitting: Family Medicine

## 2016-12-07 ENCOUNTER — Other Ambulatory Visit: Payer: Self-pay | Admitting: Internal Medicine

## 2016-12-17 ENCOUNTER — Encounter: Payer: Self-pay | Admitting: Family Medicine

## 2016-12-17 ENCOUNTER — Ambulatory Visit (INDEPENDENT_AMBULATORY_CARE_PROVIDER_SITE_OTHER): Payer: Medicaid Other | Admitting: Family Medicine

## 2016-12-17 VITALS — BP 102/66 | HR 78 | Temp 97.9°F | Ht 60.0 in | Wt 153.6 lb

## 2016-12-17 DIAGNOSIS — R1011 Right upper quadrant pain: Secondary | ICD-10-CM

## 2016-12-17 DIAGNOSIS — K219 Gastro-esophageal reflux disease without esophagitis: Secondary | ICD-10-CM

## 2016-12-17 MED ORDER — OMEPRAZOLE 20 MG PO CPDR: 20.0000 mg | DELAYED_RELEASE_CAPSULE | Freq: Two times a day (BID) | ORAL | 3 refills | Status: DC

## 2016-12-17 NOTE — Patient Instructions (Signed)
Nice to see you again today. Your gallbladder looks normal without gallstones on your recent ultrasound. You do have some fatty liver changes which is common when you have diabetes and high blood pressure. This does not usually cause pain. The pain you're feeling may be related to your gallbladder not contracting correctly or your reflux. We will try an increased dose of your omeprazole for a month and see if this helps. You can take it twice daily. We could consider additional imaging of your gallbladder, but as you said, we will continue to monitor and play a by ear.  Take care, Dr. Leonard Schwartz

## 2016-12-17 NOTE — Progress Notes (Signed)
Subjective:   Deborah [name] is a 66 y.o. female with a history of A. 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 Referral    RUQ pain Intermittent, nothing seems to provoke No association with eating Lasts 5-10 minutes Intermittent diarrhea with Metformin Present for 2 yrs Burning pain Sometimes epigastric Concerned about liver issues such as hepatitis (she is from [place] and this is very common there) as her abd [place] (ordered by Hematologist) showed fatty liver changes Denies weight loss, N/V, constipation, dysphagia, change in appetite  Review of Systems:  Per HPI.   Social History: never smoker  Objective:  BP 102/66   Pulse 78   Temp 97.9 F (36.6 C) (Oral)   Ht 5' (1.524 m)   Wt 153 lb 9.6 oz (69.7 kg)   SpO2 97%   BMI 30.00 kg/m   Gen:  66 y.o. female in NAD HEENT: NCAT, MMM, anicteric sclerae, No scleral icterus CV: Irregular, 2/6 holosystolic murmur heard best at the left lower sternal border Resp: Non-labored, CTAB, no wheezes noted Abd: Soft, NTND, BS present, no guarding or organomegaly Ext: WWP, no edema MSK: No obvious deformities, gait intact Neuro: Alert and oriented, speech normal       Chemistry      Component Value Date/Time   NA 143 09/05/2016 1127   NA 142 08/14/2016 0927   K 3.7 09/05/2016 1127   K 4.1 08/14/2016 0927   CL 105 09/05/2016 1127   CL 108 (H) 02/12/2013 0813   CO2 29 09/05/2016 1127   CO2 29 08/14/2016 0927   BUN 23 09/05/2016 1127   BUN 17.2 08/14/2016 0927   CREATININE 1.09 (H) 09/05/2016 1127   CREATININE 1.1 08/14/2016 0927      Component Value Date/Time   CALCIUM 9.5 09/05/2016 1127   CALCIUM 9.3 08/14/2016 0927   ALKPHOS 90 08/14/2016 0927   AST 29 08/14/2016 0927   ALT 34 08/14/2016 0927   BILITOT 1.38 (H) 08/14/2016 0927      Lab Results  Component Value Date   WBC 6.4 08/14/2016   HGB 11.7 08/14/2016   HCT 36.7 08/14/2016   MCV 73.7 (L) 08/14/2016   PLT 133 (L) 08/14/2016   Lab Results  Component Value Date   TSH 1.758 09/06/2016   Lab Results  Component Value Date   HGBA1C 7.0 02/21/2016   Ultrasound abdomen complete (09/03/16): There is echogenic consistent fatty infiltration and/or hepatocellular disease. No acute abnormality identified. Gallbladder is visualized without gallstones or signs of cholecystitis. No common bile duct dilation  Assessment & Plan:     Deborah [name] is a 66 y.o. female here for   RUQ pain Recent CMP with normal LFTs Bilirubin is chronically elevated as a result of her hemoglobin E disease and slight hemolysis Abdominal ultrasound with only fatty liver infiltration Discuss with patient that steatohepatitis is common in patients with metabolic disease and is not painful Reassured her that this is not a viral hepatitis No gallstones noted, but she could have biliary dyskinesia, so offered HIDA scan Patient wishes to continue monitoring at this time and not get additional imaging Could also be related to GERD as it is a burning pain that is occasionally epigastric We will try higher dose of omeprazole for 1 month to see if this improves the pain   Erasmo Downer, MD MPH  PGY-3,  [facility] 12/18/2016  1:40 PM

## 2016-12-18 ENCOUNTER — Ambulatory Visit (INDEPENDENT_AMBULATORY_CARE_PROVIDER_SITE_OTHER): Payer: Medicaid Other

## 2016-12-18 DIAGNOSIS — I059 Rheumatic mitral valve disease, unspecified: Secondary | ICD-10-CM

## 2016-12-18 DIAGNOSIS — Z7901 Long term (current) use of anticoagulants: Secondary | ICD-10-CM

## 2016-12-18 DIAGNOSIS — Z5181 Encounter for therapeutic drug level monitoring: Secondary | ICD-10-CM | POA: Diagnosis not present

## 2016-12-18 LAB — POCT INR: INR: 3.3

## 2016-12-18 NOTE — Assessment & Plan Note (Signed)
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

## 2016-12-24 ENCOUNTER — Encounter (HOSPITAL_COMMUNITY): Payer: Self-pay

## 2016-12-24 ENCOUNTER — Ambulatory Visit (HOSPITAL_COMMUNITY)
Admission: RE | Admit: 2016-12-24 | Discharge: 2016-12-24 | Disposition: A | Discharge status: Home / Self Care | Payer: Medicaid Other | Source: Ambulatory Visit | Attending: Cardiology | Admitting: Cardiology

## 2016-12-24 ENCOUNTER — Ambulatory Visit (HOSPITAL_BASED_OUTPATIENT_CLINIC_OR_DEPARTMENT_OTHER)
Admission: RE | Admit: 2016-12-24 | Discharge: 2016-12-24 | Disposition: A | Discharge status: Home / Self Care | Payer: Medicaid Other | Source: Ambulatory Visit | Attending: Cardiology | Admitting: Cardiology

## 2016-12-24 VITALS — BP 110/69 | HR 92 | Wt 152.0 lb

## 2016-12-24 DIAGNOSIS — N189 Chronic kidney disease, unspecified: Secondary | ICD-10-CM | POA: Diagnosis not present

## 2016-12-24 DIAGNOSIS — I5032 Chronic diastolic (congestive) heart failure: Secondary | ICD-10-CM | POA: Insufficient documentation

## 2016-12-24 DIAGNOSIS — J189 Pneumonia, unspecified organism: Secondary | ICD-10-CM

## 2016-12-24 DIAGNOSIS — I482 Chronic atrial fibrillation, unspecified: Secondary | ICD-10-CM

## 2016-12-24 DIAGNOSIS — Z7901 Long term (current) use of anticoagulants: Secondary | ICD-10-CM | POA: Diagnosis not present

## 2016-12-24 DIAGNOSIS — Z952 Presence of prosthetic heart valve: Secondary | ICD-10-CM | POA: Insufficient documentation

## 2016-12-24 DIAGNOSIS — Z7984 Long term (current) use of oral hypoglycemic drugs: Secondary | ICD-10-CM | POA: Insufficient documentation

## 2016-12-24 DIAGNOSIS — M109 Gout, unspecified: Secondary | ICD-10-CM | POA: Diagnosis not present

## 2016-12-24 DIAGNOSIS — I130 Hypertensive heart and chronic kidney disease with heart failure and stage 1 through stage 4 chronic kidney disease, or unspecified chronic kidney disease: Secondary | ICD-10-CM | POA: Insufficient documentation

## 2016-12-24 DIAGNOSIS — Z79899 Other long term (current) drug therapy: Secondary | ICD-10-CM | POA: Insufficient documentation

## 2016-12-24 DIAGNOSIS — R509 Fever, unspecified: Secondary | ICD-10-CM | POA: Diagnosis not present

## 2016-12-24 DIAGNOSIS — R7301 Impaired fasting glucose: Secondary | ICD-10-CM | POA: Diagnosis not present

## 2016-12-24 DIAGNOSIS — Z7982 Long term (current) use of aspirin: Secondary | ICD-10-CM | POA: Insufficient documentation

## 2016-12-24 DIAGNOSIS — E785 Hyperlipidemia, unspecified: Secondary | ICD-10-CM | POA: Diagnosis not present

## 2016-12-24 LAB — BASIC METABOLIC PANEL
Anion gap: 10 (ref 5–15)
BUN: 19 mg/dL (ref 6–20)
CALCIUM: 8.7 mg/dL — AB (ref 8.9–10.3)
CHLORIDE: 103 mmol/L (ref 101–111)
CO2: 24 mmol/L (ref 22–32)
CREATININE: 1.37 mg/dL — AB (ref 0.44–1.00)
GFR, EST AFRICAN AMERICAN: 46 mL/min — AB (ref >60)
GFR, EST NON AFRICAN AMERICAN: 40 mL/min — AB (ref >60)
Glucose, Bld: 138 mg/dL — ABNORMAL HIGH (ref 65–99)
Potassium: 4.1 mmol/L (ref 3.5–5.1)
SODIUM: 137 mmol/L (ref 135–145)

## 2016-12-24 LAB — CBC
HCT: 37.2 % (ref 36.0–46.0)
Hemoglobin: 11.9 g/dL — ABNORMAL LOW (ref 12.0–15.0)
MCH: 23.3 pg — ABNORMAL LOW (ref 26.0–34.0)
MCHC: 32 g/dL (ref 30.0–36.0)
MCV: 72.8 fL — AB (ref 78.0–100.0)
PLATELETS: 145 10*3/uL — AB (ref 150–400)
RBC: 5.11 MIL/uL (ref 3.87–5.11)
RDW: 15.1 % (ref 11.5–15.5)
WBC: 7.3 10*3/uL (ref 4.0–10.5)

## 2016-12-24 MED ORDER — LEVOFLOXACIN 750 MG PO TABS: 750.0000 mg | ORAL_TABLET | Freq: Every day | ORAL | 0 refills | Status: DC

## 2016-12-24 MED ORDER — LEVOFLOXACIN 750 MG PO TABS
750.0000 mg | ORAL_TABLET | Freq: Every day | ORAL | 0 refills | Status: DC
Start: 2016-12-24 — End: 2017-01-10

## 2016-12-24 MED ORDER — HYDROCOD POLST-CPM POLST ER 10-8 MG/5ML PO SUER: 5.0000 mL | Freq: Two times a day (BID) | ORAL | 0 refills | Status: DC | PRN

## 2016-12-24 NOTE — Patient Instructions (Signed)
Labs today (will call for abnormal results, otherwise no news is good news)  Your Coumadin clinic appointment has been rescheduled for this Thursday May 3rd @ 9:30 am.  They will need to check your INR and adjust your dosage accordingly due to possible side effects of Levofloxacin.   START taking Levofloxacin 750 mg (1 Tablet) Once Daily for 10 days only.  Chest XRay today.  Follow up next week.

## 2016-12-25 NOTE — Progress Notes (Signed)
Patient ID: Deborah [name], female   DOB: 1950/11/23, 66 y.o.   MRN: [id number] PCP: Dr. Beryle Flock Cardiology: Dr. [name]  66 yo with rheumatic heart disease and MV replacement/TV repair, chronic atrial fibrillation, and chronic diastolic CHF.   No chest pain. No BRBPR or melena.  Chronically sleeps on 2 pillows.  Weight is down 4 lbs again.  Echo today showed EF 60-65% with normally functioning mechanical mitral valve.   She has had a cough since Thursday, this has been productive of dark phlegm.  She has had a fever of > 100 over the last couple of days.  She has shaking chills.  She has had increased dyspnea when walking short distances.    Labs (8/12): K 3.5, creatinine 0.98 Labs (10/12): K 3.8, creatinine 1.0, BNP 116 Labs (1/13): K 3.6, creatinine 0.9, LDL 85, HDL 62 Labs (9/13): K 3.9, creatinine 1.0 Labs (4/14): HCT 37.8 with elevated eosinophils, BNP 1094 => 82, K 3.6, creatinine 1.0 Labs (9/14): K 3.6, creatinine 0.89, hemoglobin 11 Labs (1/15): K 3.7, creatinine 0.9, HCT 36.9 Labs (2/15): LDL 99, HDL 50 Labs (3/15): K 4, creatinine 1.28 Labs (6/15): K 3.7, creatinine 1.0, BNP 124 => 80 Labs (02/22/14): K 3.5 Creatinine 0.9 Labs (10/15): K 3.3, creatinine 0.94, HCT 38.9 Labs (6/16): K 3.9, creatinine 1.1, HCT 36.4 Labs (9/16): K 3.9, creatinine 1.61, HCT 38.5 Labs (12/16): HCT 39.4, K 4.2, creatinine 1.1 Labs (1/17): K 4.2, creatinine 1.01, BNP 109, LDL 120, HDL 70 Labs (3/17): K 3.9, creatinine 1.02 Labs (4/17): K 4, creatinine 0.98, BNP 83 Labs (9/17): LDL 87, HDL 61 Labs (12/17): HCT 36.7 Labs (1/18): K 3.7, creatinine 1.09, TSH normal, hgb 11.7  PMH: 1. Rheumatic heart disease: Status post mechanical MV replacement and tricuspid repair at [facility] in 2002.  Echo 5/12 with EF 60-65%, mild AI, mechanical MV prosthesis with normal function, severe LAE, moderate RAE, moderate to severe TR, mildly elevated PA systolic pressure.  Echo (6/15) EF 55-60%, normal RV, normal mechanical  mitral valve, severe LAE, moderate TR.  Echo (9/16) with EF 55-60%, mild to moderate AI, mechanical mitral valve looks ok, s/p TV repair with mild TR, PASP 40.  - Echo (4/18): EF 60-65%, mechanical mitral valve functioned normally, mild AI, repaired tricuspid valve with mild TR, no TS; PASP 33 mmHg.  2. Chronic atrial fibrillation on coumadin.  Event monitor (7/12-8/12) showed no high rate episodes (patient continuously in atrial fibrillation).  3. Chronic diastolic CHF 4. ETT-myoview (7/12): No evidence for ischemia or infarction.  5. Impaired fasting glucose 6. Hyperlipidemia  7. HTN 8. Gout 9. Anemia 10. External hemorrhoids.  11. 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  13. CKD  SH: Nonsmoker. Lives in [place].   FH: No premature CAD  ROS: All systems reviewed and negative except as noted in HPI.    Current Outpatient Prescriptions  Medication Sig Dispense Refill  . acetaminophen (TYLENOL) 500 MG tablet Take 500-1,000 mg by mouth every 6 (six) hours as needed for fever (pain).    Marland Kitchen allopurinol (ZYLOPRIM) 300 MG tablet TAKE ONE TABLET BY MOUTH EVERY DAY 30 tablet 0  . aspirin 81 MG chewable tablet Chew 81 mg by mouth daily.    . cyanocobalamin 500 MCG tablet Take 1 tablet (500 mcg total) by mouth daily. 30 tablet 5  . cycloSPORINE (RESTASIS) 0.05 % ophthalmic emulsion Place 1 drop into both eyes 2 (two) times daily.    Marland Kitchen  diazepam (VALIUM) 5 MG tablet TAKE 1 TABLET BY MOUTH EVERY 8 HOURS AS NEEDED FOR ANXIETY    . diclofenac sodium (VOLTAREN) 1 % GEL Apply 2 g topically 4 (four) times daily. (Patient taking differently: Apply 2 g topically 4 (four) times daily as needed (pain). ) 100 g 3  . folic acid (FOLVITE) 1 MG tablet TAKE ONE TABLET BY MOUTH EVERY DAY 30 tablet 3  . glucose blood (ACCU-CHEK AVIVA PLUS) test strip USE AS INSTRUCTED TO TEST BLOOD GLUCOSE TWICE A DAY 100 each 12  . isosorbide mononitrate (IMDUR)  30 MG 24 hr tablet Take 0.5 tablets (15 mg total) by mouth daily. 30 tablet 0  . loratadine (CLARITIN) 10 MG tablet Take 10 mg by mouth daily as needed for allergies.     Marland Kitchen meclizine (ANTIVERT) 25 MG tablet Take 1 tablet (25 mg total) by mouth 3 (three) times daily as needed for dizziness. 30 tablet 0  . Menthol-Methyl Salicylate (TIGER BALM LINIMENT EX) Apply 1 application topically daily.    . metFORMIN (GLUCOPHAGE) 500 MG tablet TAKE TWO TABLETS BY MOUTH TWICE DAILY WITH A MEAL 120 tablet 2  . metoprolol succinate (TOPROL-XL) 50 MG 24 hr tablet Take  (1.5 tablets) daily. 45 tablet 3  . Olopatadine HCl (PAZEO) 0.7 % SOLN Place 1 drop into both eyes daily as needed (itching/ irritation).    Marland Kitchen omeprazole (PRILOSEC) 20 MG capsule Take 1 capsule (20 mg total) by mouth 2 (two) times daily before a meal. 60 capsule 3  . potassium chloride SA (K-DUR,KLOR-CON) 20 MEQ tablet TAKE THREE TABLETS BY MOUTH TWICE DAILY 240 tablet 3  . simvastatin (ZOCOR) 10 MG tablet Take 1 tablet (10 mg total) by mouth daily. 30 tablet 6  . torsemide (DEMADEX) 20 MG tablet TAKE 4 TABLETS BY MOUTH TWICE DAILY 240 tablet 3  . warfarin (COUMADIN) 2 MG tablet TAKE ONE TABLET BY MOUTH EVERY DAY EXCEPT ONE-HALF TABLET ON MONDAY AND WEDNESDAY OR AS DIRECTED 80 tablet 3  . chlorpheniramine-HYDROcodone (TUSSIONEX) 10-8 MG/5ML SUER Take 5 mLs by mouth every 12 (twelve) hours as needed for cough. 140 mL 0  . levofloxacin (LEVAQUIN) 750 MG tablet Take 1 tablet (750 mg total) by mouth daily. For 10 Days 10 tablet 0  . nitroGLYCERIN (NITROSTAT) 0.4 MG SL tablet Place 0.4 mg under the tongue every 5 (five) minutes as needed for chest pain.     No current facility-administered medications for this encounter.     BP 110/69   Pulse 92   Wt 152 lb (68.9 kg)   SpO2 96%   BMI 29.69 kg/m  General: NAD Neck: No JVD, no thyromegaly or thyroid nodule.  Lungs: Diffuse rhonchi bilaterally. CV: Nondisplaced PMI.  Heart irregular S1/S2, no  S3/S4, mechanical S1, 2/6 HSM LLSB.  No peripheral edema.  No carotid bruit.  Normal pedal pulses.  Abdomen: Soft, nontender, no hepatosplenomegaly, no distention.  Neurologic: Alert and oriented x 3.  Psych: Normal affect. Extremities: No clubbing or cyanosis.   Assessment/Plan: 1. Atrial fibrillation: Chronic.  Continue warfarin.  Continue Toprol XL 75 mg daily.     2. Mechanical mitral valve: Continue ASA/coumadin, INR goal 2.5-3.5.  Will need bridging with heparin/Lovenox if has to come off coumadin.  Needs endocarditis prophylaxis with any dental work. Mechanical MV looks ok on echo today.  3. Diastolic CHF:  Volume status appears ok today.  Increased dyspnea over last few days, but think this is due to PNA versus acute bronchitis.  -  Continue torsemide 80 mg bid.  - BMET.  4. Hyperlipidemia: Good lipids in 9/17. 5. ID: Fever, chills, rhonchi on lung exam.  I am concerned that she has PNA versus acute bronchitis.  - CXR today.  - CBC  - I will give her levofloxacin at PNA dosing range (750 mg daily) for 10 day course.    Followup for re-evaluation with PA/NP in 1 week.   Marca Ancona 12/25/2016

## 2016-12-27 ENCOUNTER — Ambulatory Visit (INDEPENDENT_AMBULATORY_CARE_PROVIDER_SITE_OTHER): Payer: Medicaid Other | Admitting: *Deleted

## 2016-12-27 DIAGNOSIS — I059 Rheumatic mitral valve disease, unspecified: Secondary | ICD-10-CM

## 2016-12-27 DIAGNOSIS — Z7901 Long term (current) use of anticoagulants: Secondary | ICD-10-CM | POA: Diagnosis not present

## 2016-12-27 DIAGNOSIS — Z5181 Encounter for therapeutic drug level monitoring: Secondary | ICD-10-CM

## 2016-12-27 LAB — POCT INR: INR: 3.9

## 2017-01-01 ENCOUNTER — Encounter (HOSPITAL_COMMUNITY): Payer: Self-pay

## 2017-01-01 ENCOUNTER — Ambulatory Visit (HOSPITAL_COMMUNITY)
Admission: RE | Admit: 2017-01-01 | Discharge: 2017-01-01 | Disposition: A | Discharge status: Home / Self Care | Payer: Medicaid Other | Source: Ambulatory Visit | Attending: Internal Medicine | Admitting: Internal Medicine

## 2017-01-01 VITALS — BP 88/62 | HR 72 | Wt 151.8 lb

## 2017-01-01 DIAGNOSIS — Z952 Presence of prosthetic heart valve: Secondary | ICD-10-CM | POA: Diagnosis not present

## 2017-01-01 DIAGNOSIS — N189 Chronic kidney disease, unspecified: Secondary | ICD-10-CM | POA: Insufficient documentation

## 2017-01-01 DIAGNOSIS — J189 Pneumonia, unspecified organism: Secondary | ICD-10-CM

## 2017-01-01 DIAGNOSIS — I130 Hypertensive heart and chronic kidney disease with heart failure and stage 1 through stage 4 chronic kidney disease, or unspecified chronic kidney disease: Secondary | ICD-10-CM | POA: Diagnosis not present

## 2017-01-01 DIAGNOSIS — E785 Hyperlipidemia, unspecified: Secondary | ICD-10-CM | POA: Insufficient documentation

## 2017-01-01 DIAGNOSIS — Z79899 Other long term (current) drug therapy: Secondary | ICD-10-CM | POA: Diagnosis not present

## 2017-01-01 DIAGNOSIS — Z7984 Long term (current) use of oral hypoglycemic drugs: Secondary | ICD-10-CM | POA: Diagnosis not present

## 2017-01-01 DIAGNOSIS — I5032 Chronic diastolic (congestive) heart failure: Secondary | ICD-10-CM | POA: Diagnosis not present

## 2017-01-01 DIAGNOSIS — Z7982 Long term (current) use of aspirin: Secondary | ICD-10-CM | POA: Diagnosis not present

## 2017-01-01 DIAGNOSIS — Z7901 Long term (current) use of anticoagulants: Secondary | ICD-10-CM | POA: Diagnosis not present

## 2017-01-01 DIAGNOSIS — I482 Chronic atrial fibrillation, unspecified: Secondary | ICD-10-CM

## 2017-01-01 NOTE — Progress Notes (Signed)
Patient ID: Deborah [name], female   DOB: 06/08/1951, 66 y.o.   MRN: [id number] PCP: Dr. Beryle FlockBacigalupo Cardiology: Dr. Shirlee LatchMcLean  66 yo with rheumatic heart disease and MV replacement/TV repair, chronic atrial fibrillation, and chronic diastolic CHF.     Today she returns for HF follow up with her daughter. Last visit she was started on levofloxacin for possible pneumonia versus acute bronchitis. Overall feeling better. Coughing less frequently. Productive cough with white sputum. Still complaining of fatigue. Appetite improving. No fever or chills. No bleeding problems. Taking all medications.    Labs (8/12): K 3.5, creatinine 0.98 Labs (10/12): K 3.8, creatinine 1.0, BNP 116 Labs (1/13): K 3.6, creatinine 0.9, LDL 85, HDL 62 Labs (9/13): K 3.9, creatinine 1.0 Labs (4/14): HCT 37.8 with elevated eosinophils, BNP 1094 => 82, K 3.6, creatinine 1.0 Labs (9/14): K 3.6, creatinine 0.89, hemoglobin 11 Labs (1/15): K 3.7, creatinine 0.9, HCT 36.9 Labs (2/15): LDL 99, HDL 50 Labs (3/15): K 4, creatinine 1.28 Labs (6/15): K 3.7, creatinine 1.0, BNP 124 => 80 Labs (02/22/14): K 3.5 Creatinine 0.9 Labs (10/15): K 3.3, creatinine 0.94, HCT 38.9 Labs (6/16): K 3.9, creatinine 1.1, HCT 36.4 Labs (9/16): K 3.9, creatinine 1.61, HCT 38.5 Labs (12/16): HCT 39.4, K 4.2, creatinine 1.1 Labs (1/17): K 4.2, creatinine 1.01, BNP 109, LDL 120, HDL 70 Labs (3/17): K 3.9, creatinine 1.02 Labs (4/17): K 4, creatinine 0.98, BNP 83 Labs (9/17): LDL 87, HDL 61 Labs (12/17): HCT 36.7 Labs (1/18): K 3.7, creatinine 1.09, TSH normal, hgb 11.7 Labs (12/24/3016): K 4.1 Creatinine 1.37   PMH: 1. Rheumatic heart disease: Status post mechanical MV [facility] at [facility].  Echo 5/12 with EF 60-65%, mild AI, mechanical MV prosthesis with normal function, severe LAE, moderate RAE, moderate to severe TR, mildly elevated PA systolic pressure.  Echo (6/15) EF 55-60%, normal RV, normal mechanical mitral  valve, severe LAE, moderate TR.  Echo (9/16) with EF 55-60%, mild to moderate AI, mechanical mitral valve looks ok, s/p TV repair with mild TR, PASP 40.  - Echo (4/18): EF 60-65%, mechanical mitral valve functioned normally, mild AI, repaired tricuspid valve with mild TR, no TS; PASP 33 mmHg.  2. Chronic atrial fibrillation on coumadin.  Event monitor (7/12-8/12) showed no high rate episodes (patient continuously in atrial fibrillation).  3. Chronic diastolic CHF 4. ETT-myoview (7/12): No evidence for ischemia or infarction.  5. Impaired fasting glucose 6. Hyperlipidemia  7. HTN 8. Gout 9. Anemia 10. External hemorrhoids.  11. 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  13. CKD  SH: Nonsmoker. Lives in [place].   FH: No premature CAD  ROS: All systems reviewed and negative except as noted in HPI.    Current Outpatient Prescriptions  Medication Sig Dispense Refill  . acetaminophen (TYLENOL) 500 MG tablet Take 500-1,000 mg by mouth every 6 (six) hours as needed for fever (pain).    Marland Kitchen. allopurinol (ZYLOPRIM) 300 MG tablet TAKE ONE TABLET BY MOUTH EVERY DAY 30 tablet 0  . aspirin 81 MG chewable tablet Chew 81 mg by mouth daily.    . chlorpheniramine-HYDROcodone (TUSSIONEX) 10-8 MG/5ML SUER Take 5 mLs by mouth every 12 (twelve) hours as needed for cough. 140 mL 0  . cyanocobalamin 500 MCG tablet Take 1 tablet (500 mcg total) by mouth daily. 30 tablet 5  . cycloSPORINE (RESTASIS) 0.05 % ophthalmic emulsion Place 1 drop into both eyes 2 (two) times daily.    .Marland Kitchen  diazepam (VALIUM) 5 MG tablet TAKE 1 TABLET BY MOUTH EVERY 8 HOURS AS NEEDED FOR ANXIETY    . diclofenac sodium (VOLTAREN) 1 % GEL Apply 2 g topically 4 (four) times daily. (Patient taking differently: Apply 2 g topically 4 (four) times daily as needed (pain). ) 100 g 3  . folic acid (FOLVITE) 1 MG tablet TAKE ONE TABLET BY MOUTH EVERY DAY 30 tablet 3  . glucose blood  (ACCU-CHEK AVIVA PLUS) test strip USE AS INSTRUCTED TO TEST BLOOD GLUCOSE TWICE A DAY 100 each 12  . isosorbide mononitrate (IMDUR) 30 MG 24 hr tablet Take 0.5 tablets (15 mg total) by mouth daily. 30 tablet 0  . levofloxacin (LEVAQUIN) 750 MG tablet Take 1 tablet (750 mg total) by mouth daily. For 10 Days 10 tablet 0  . loratadine (CLARITIN) 10 MG tablet Take 10 mg by mouth daily as needed for allergies.     Marland Kitchen meclizine (ANTIVERT) 25 MG tablet Take 1 tablet (25 mg total) by mouth 3 (three) times daily as needed for dizziness. 30 tablet 0  . Menthol-Methyl Salicylate (TIGER BALM LINIMENT EX) Apply 1 application topically daily.    . metFORMIN (GLUCOPHAGE) 500 MG tablet TAKE TWO TABLETS BY MOUTH TWICE DAILY WITH A MEAL 120 tablet 2  . metoprolol succinate (TOPROL-XL) 50 MG 24 hr tablet Take 75mg  (1.5 tablets) daily. 45 tablet 3  . nitroGLYCERIN (NITROSTAT) 0.4 MG SL tablet Place 0.4 mg under the tongue every 5 (five) minutes as needed for chest pain.    Marland Kitchen Olopatadine HCl (PAZEO) 0.7 % SOLN Place 1 drop into both eyes daily as needed (itching/ irritation).    Marland Kitchen omeprazole (PRILOSEC) 20 MG capsule Take 1 capsule (20 mg total) by mouth 2 (two) times daily before a meal. 60 capsule 3  . potassium chloride SA (K-DUR,KLOR-CON) 20 MEQ tablet TAKE THREE TABLETS BY MOUTH TWICE DAILY 240 tablet 3  . simvastatin (ZOCOR) 10 MG tablet Take 1 tablet (10 mg total) by mouth daily. 30 tablet 6  . torsemide (DEMADEX) 20 MG tablet TAKE 4 TABLETS BY MOUTH TWICE DAILY 240 tablet 3  . warfarin (COUMADIN) 2 MG tablet TAKE ONE TABLET BY MOUTH EVERY DAY EXCEPT ONE-HALF TABLET ON MONDAY AND WEDNESDAY OR AS DIRECTED 80 tablet 3   No current facility-administered medications for this encounter.     BP (!) 88/62 (BP Location: Left Arm, Patient Position: Sitting, Cuff Size: Normal)   Pulse 72 Comment: irregular  Wt 151 lb 12.8 oz (68.9 kg)   SpO2 98%   BMI 29.65 kg/m   General:  Elderly. NAD. Walked in the clinic.   HEENT: normal Neck: supple. no JVD. Carotids 2+ bilat; no bruits. No lymphadenopathy or thryomegaly appreciated. Cor: PMI nondisplaced. Irregular rate & rhythm. No rubs, gallops or murmurs. Lungs: clear Abdomen: soft, nontender, nondistended. No hepatosplenomegaly. No bruits or masses. Good bowel sounds. Extremities: no cyanosis, clubbing, rash, edema Neuro: alert & orientedx3, cranial nerves grossly intact. moves all 4 extremities w/o difficulty. Affect pleasant   Assessment/Plan: 1. Atrial fibrillation: Chronic.  Rate controlled. Continue warfarin.  Continue Toprol XL 75 mg daily.     2. Mechanical mitral valve: Continue ASA/coumadin, INR goal 2.5-3.5.  Will need bridging with heparin/Lovenox if has to come off coumadin.  Needs endocarditis prophylaxis with any dental work.  3. Diastolic CHF:  Volume status stable. Continue torsemide 80 mg twice a day.  4. Hyperlipidemia: Good lipids in 9/17. 5. ID: Completing levofloxacin for possible pneumonia.  Improving. I  have instructed her to contact PCP with she develops fever.   Follow up 3 months with Dr [name].      Deborah Vanderlinden  NP-C  01/01/2017

## 2017-01-01 NOTE — Patient Instructions (Signed)
No changes to medication at this time.  No labs today.  Follow up 3 months with Dr. Shirlee LatchMcLean.  Do the following things EVERYDAY: 1) Weigh yourself in the morning before breakfast. Write it down and keep it in a log. 2) Take your medicines as prescribed 3) Eat low salt foods-Limit salt (sodium) to 2000 mg per day.  4) Stay as active as you can everyday 5) Limit all fluids for the day to less than 2 liters

## 2017-01-10 ENCOUNTER — Ambulatory Visit (INDEPENDENT_AMBULATORY_CARE_PROVIDER_SITE_OTHER): Payer: Medicaid Other | Admitting: *Deleted

## 2017-01-10 DIAGNOSIS — I059 Rheumatic mitral valve disease, unspecified: Secondary | ICD-10-CM

## 2017-01-10 DIAGNOSIS — Z5181 Encounter for therapeutic drug level monitoring: Secondary | ICD-10-CM | POA: Diagnosis not present

## 2017-01-10 DIAGNOSIS — Z7901 Long term (current) use of anticoagulants: Secondary | ICD-10-CM | POA: Diagnosis not present

## 2017-01-10 LAB — POCT INR: INR: 3.7

## 2017-01-24 ENCOUNTER — Other Ambulatory Visit: Payer: Self-pay | Admitting: Cardiology

## 2017-01-24 ENCOUNTER — Other Ambulatory Visit: Payer: Self-pay | Admitting: *Deleted

## 2017-01-24 ENCOUNTER — Other Ambulatory Visit: Payer: Self-pay | Admitting: Internal Medicine

## 2017-01-25 ENCOUNTER — Ambulatory Visit (INDEPENDENT_AMBULATORY_CARE_PROVIDER_SITE_OTHER): Payer: Medicaid Other | Admitting: *Deleted

## 2017-01-25 ENCOUNTER — Other Ambulatory Visit (HOSPITAL_COMMUNITY): Payer: Self-pay | Admitting: Cardiology

## 2017-01-25 DIAGNOSIS — Z5181 Encounter for therapeutic drug level monitoring: Secondary | ICD-10-CM | POA: Diagnosis not present

## 2017-01-25 DIAGNOSIS — Z7901 Long term (current) use of anticoagulants: Secondary | ICD-10-CM

## 2017-01-25 DIAGNOSIS — I059 Rheumatic mitral valve disease, unspecified: Secondary | ICD-10-CM

## 2017-01-25 LAB — POCT INR: INR: 2.5

## 2017-01-25 MED ORDER — WARFARIN SODIUM 2 MG PO TABS: ORAL_TABLET | ORAL | 1 refills | Status: DC

## 2017-01-25 MED ORDER — METFORMIN HCL 500 MG PO TABS: ORAL_TABLET | ORAL | 2 refills | Status: DC

## 2017-01-28 ENCOUNTER — Other Ambulatory Visit: Payer: Self-pay | Admitting: *Deleted

## 2017-01-28 MED ORDER — OMEPRAZOLE 20 MG PO CPDR: 20.0000 mg | DELAYED_RELEASE_CAPSULE | Freq: Two times a day (BID) | ORAL | 3 refills | Status: DC

## 2017-01-29 ENCOUNTER — Other Ambulatory Visit (HOSPITAL_COMMUNITY): Payer: Self-pay | Admitting: Cardiology

## 2017-01-31 ENCOUNTER — Other Ambulatory Visit (HOSPITAL_COMMUNITY): Payer: Self-pay | Admitting: Cardiology

## 2017-01-31 MED ORDER — MECLIZINE HCL 25 MG PO TABS: 25.0000 mg | ORAL_TABLET | Freq: Three times a day (TID) | ORAL | 0 refills | Status: DC | PRN

## 2017-02-01 ENCOUNTER — Other Ambulatory Visit: Payer: Self-pay | Admitting: *Deleted

## 2017-02-05 MED ORDER — POTASSIUM CHLORIDE CRYS ER 20 MEQ PO TBCR: 60.0000 meq | EXTENDED_RELEASE_TABLET | Freq: Two times a day (BID) | ORAL | 3 refills | Status: DC

## 2017-02-15 ENCOUNTER — Ambulatory Visit (INDEPENDENT_AMBULATORY_CARE_PROVIDER_SITE_OTHER): Payer: Medicaid Other

## 2017-02-15 DIAGNOSIS — I059 Rheumatic mitral valve disease, unspecified: Secondary | ICD-10-CM | POA: Diagnosis not present

## 2017-02-15 DIAGNOSIS — Z7901 Long term (current) use of anticoagulants: Secondary | ICD-10-CM | POA: Diagnosis not present

## 2017-02-15 DIAGNOSIS — Z5181 Encounter for therapeutic drug level monitoring: Secondary | ICD-10-CM

## 2017-02-15 LAB — POCT INR: INR: 2.5

## 2017-02-16 ENCOUNTER — Other Ambulatory Visit: Payer: Self-pay | Admitting: Internal Medicine

## 2017-03-04 ENCOUNTER — Other Ambulatory Visit: Payer: Self-pay | Admitting: Family Medicine

## 2017-03-04 ENCOUNTER — Other Ambulatory Visit: Payer: Self-pay | Admitting: Cardiology

## 2017-03-12 ENCOUNTER — Other Ambulatory Visit (HOSPITAL_COMMUNITY): Payer: Self-pay | Admitting: *Deleted

## 2017-03-12 MED ORDER — SIMVASTATIN 10 MG PO TABS: 10.0000 mg | ORAL_TABLET | Freq: Every day | ORAL | 6 refills | Status: DC

## 2017-03-13 ENCOUNTER — Other Ambulatory Visit: Payer: Self-pay | Admitting: Family Medicine

## 2017-03-15 ENCOUNTER — Ambulatory Visit (INDEPENDENT_AMBULATORY_CARE_PROVIDER_SITE_OTHER): Payer: Medicaid Other | Admitting: *Deleted

## 2017-03-15 DIAGNOSIS — Z5181 Encounter for therapeutic drug level monitoring: Secondary | ICD-10-CM

## 2017-03-15 DIAGNOSIS — I059 Rheumatic mitral valve disease, unspecified: Secondary | ICD-10-CM | POA: Diagnosis not present

## 2017-03-15 DIAGNOSIS — Z7901 Long term (current) use of anticoagulants: Secondary | ICD-10-CM

## 2017-03-15 LAB — POCT INR: INR: 3.5

## 2017-04-05 ENCOUNTER — Ambulatory Visit (INDEPENDENT_AMBULATORY_CARE_PROVIDER_SITE_OTHER): Payer: Medicaid Other | Admitting: Family Medicine

## 2017-04-05 ENCOUNTER — Other Ambulatory Visit: Payer: Self-pay | Admitting: Cardiology

## 2017-04-05 ENCOUNTER — Ambulatory Visit (HOSPITAL_COMMUNITY)
Admission: RE | Admit: 2017-04-05 | Discharge: 2017-04-05 | Disposition: A | Discharge status: Home / Self Care | Payer: Medicaid Other | Source: Ambulatory Visit | Attending: Family Medicine | Admitting: Family Medicine

## 2017-04-05 ENCOUNTER — Encounter: Payer: Self-pay | Admitting: Family Medicine

## 2017-04-05 VITALS — BP 100/60 | HR 70 | Temp 98.5°F | Ht 60.0 in | Wt 152.0 lb

## 2017-04-05 DIAGNOSIS — E118 Type 2 diabetes mellitus with unspecified complications: Secondary | ICD-10-CM | POA: Diagnosis present

## 2017-04-05 DIAGNOSIS — M25561 Pain in right knee: Secondary | ICD-10-CM | POA: Diagnosis not present

## 2017-04-05 DIAGNOSIS — M25562 Pain in left knee: Secondary | ICD-10-CM

## 2017-04-05 DIAGNOSIS — G8929 Other chronic pain: Secondary | ICD-10-CM

## 2017-04-05 NOTE — Patient Instructions (Signed)
It was nice meeting you! You were seen in clinic for knee pain which I think is most likely due to some arthritis.  I have ordered x-rays of both your knees.  You can go to the hospital Radiology department for this and do not need to make an appointment.  I will call you once I have the results of the imaging. You can follow up with me in about 2-3 weeks.   Please call clinic with any questions.   Be well,  Freddrick MarchYashika Arvis Zwahlen, MD

## 2017-04-05 NOTE — Progress Notes (Signed)
Subjective:   Patient ID: Deborah [name]    DOB: 1951/05/12, 66 y.o. female   MRN: [id number]  CC: knee pain    HPI: Deborah [name] is a 65 y.o. female who presents to clinic today for bilateral knee pain.  No h/o fall or injury.  Slow onset over the last 2-3 months, now worsening with R>L.  Worse especially when sitting down on the floor but also hurts when going from sitting to standing position, climbing stairs, walking downstairs, and in general aching pain.  -Takes Tylenol when it hurts bad.  Also uses IcyHot rub and massages the area.  Has not tried applying ice.  She sometimes soaks legs and feet in hot water which helps.   -Has never had imaging or steroid injections.  Does not want injection if she can avoid it.    ROS: No fevers, chills, nausea, vomiting.     PMFSH: Pertinent past medical, surgical, family, and social history were reviewed and updated as appropriate. Smoking status reviewed. Medications reviewed.  Objective:   BP 100/60   Pulse 70   Temp 98.5 F (36.9 C) (Oral)   Ht 5' (1.524 m)   Wt 152 lb (68.9 kg)   SpO2 97%   BMI 29.69 kg/m  Vitals and nursing note reviewed.  General: 66 yo female, NAD  CV: RRR no MRG  Lungs: CTAB, normal effort  Skin: warm, dry, no rashes or lesions, cap refill < 2 seconds Extremities: warm and well perfused, normal tone Knee: Normal to inspection with no erythema, effusion or obvious bony abnormalities. Palpation normal with no warmth.   Right knee with lateral joint line tenderness, left knee with mild medial joint line tenderness.  ROM normal in flexion and extension and lower leg rotation. Ligaments with solid consistent endpoints including ACL, PCL, LCL, MCL. Negative Mcmurray's and provocative meniscal tests. Non painful patellar compression however crepitus present with flexion/extension R>L.  Patellar and quadriceps tendons unremarkable. Hamstring and quadriceps strength is normal.  Assessment & Plan:    Knee pain, bilateral Findings likely consistent with OA.  Infectious etiology unlikely given afebrile, joints not warm/swollen and lack of systemic symptoms.  Without prior imaging.  -Will obtain plain films of bilateral knees -Expect improvement with NSAIDs however patient taking Coumadin for mitral valve replacement and therefore will avoid -Tylenol as needed for now -Recommend continue warm water/heating pad for comfort -will follow up films to delineate further treatment plan   Orders Placed This Encounter  Procedures  . DG Knee Complete 4 Views Right    Standing Status:   Future    Number of Occurrences:   1    Standing Expiration Date:   06/05/2018    Order Specific Question:   Reason for Exam (SYMPTOM  OR DIAGNOSIS REQUIRED)    Answer:   R knee pain    Order Specific Question:   Preferred imaging location?    Answer:   [facility]    Order Specific Question:   Radiology Contrast Protocol - do NOT remove file path    Answer:   \\[email or web address]\Radiant\DXFluoroContrastProtocols.pdf  . DG Knee Complete 4 Views Left    Standing Status:   Future    Number of Occurrences:   1    Standing Expiration Date:   06/05/2018    Order Specific Question:   Reason for Exam (SYMPTOM  OR DIAGNOSIS REQUIRED)    Answer:   L knee pain    Order Specific Question:   Preferred  imaging [facility]?    [facility]    Order Specific Question:   Radiology Contrast Protocol - do NOT remove file path    Answer:   \\[email or web address]\Radiant\DXFluoroContrastProtocols.pdf  . POCT glycosylated hemoglobin (Hb A1C)   Follow up: 2 weeks   Freddrick MarchYashika Meral Geissinger, MD [facility], PGY-1 04/12/2017 12:45 AM

## 2017-04-09 ENCOUNTER — Ambulatory Visit (INDEPENDENT_AMBULATORY_CARE_PROVIDER_SITE_OTHER): Payer: Medicaid Other | Admitting: *Deleted

## 2017-04-09 ENCOUNTER — Ambulatory Visit (HOSPITAL_COMMUNITY)
Admission: RE | Admit: 2017-04-09 | Discharge: 2017-04-09 | Disposition: A | Discharge status: Home / Self Care | Payer: Medicaid Other | Source: Ambulatory Visit | Attending: Cardiology | Admitting: Cardiology

## 2017-04-09 VITALS — BP 128/72 | HR 89 | Wt 149.8 lb

## 2017-04-09 DIAGNOSIS — E785 Hyperlipidemia, unspecified: Secondary | ICD-10-CM | POA: Insufficient documentation

## 2017-04-09 DIAGNOSIS — Z7901 Long term (current) use of anticoagulants: Secondary | ICD-10-CM

## 2017-04-09 DIAGNOSIS — N189 Chronic kidney disease, unspecified: Secondary | ICD-10-CM | POA: Diagnosis not present

## 2017-04-09 DIAGNOSIS — I059 Rheumatic mitral valve disease, unspecified: Secondary | ICD-10-CM | POA: Diagnosis not present

## 2017-04-09 DIAGNOSIS — D560 Alpha thalassemia: Secondary | ICD-10-CM | POA: Diagnosis not present

## 2017-04-09 DIAGNOSIS — I482 Chronic atrial fibrillation, unspecified: Secondary | ICD-10-CM

## 2017-04-09 DIAGNOSIS — I130 Hypertensive heart and chronic kidney disease with heart failure and stage 1 through stage 4 chronic kidney disease, or unspecified chronic kidney disease: Secondary | ICD-10-CM | POA: Diagnosis present

## 2017-04-09 DIAGNOSIS — M109 Gout, unspecified: Secondary | ICD-10-CM | POA: Insufficient documentation

## 2017-04-09 DIAGNOSIS — Z952 Presence of prosthetic heart valve: Secondary | ICD-10-CM | POA: Insufficient documentation

## 2017-04-09 DIAGNOSIS — Z7982 Long term (current) use of aspirin: Secondary | ICD-10-CM | POA: Diagnosis not present

## 2017-04-09 DIAGNOSIS — Z7984 Long term (current) use of oral hypoglycemic drugs: Secondary | ICD-10-CM | POA: Insufficient documentation

## 2017-04-09 DIAGNOSIS — Z5181 Encounter for therapeutic drug level monitoring: Secondary | ICD-10-CM | POA: Diagnosis not present

## 2017-04-09 DIAGNOSIS — Z79899 Other long term (current) drug therapy: Secondary | ICD-10-CM | POA: Diagnosis not present

## 2017-04-09 DIAGNOSIS — I5032 Chronic diastolic (congestive) heart failure: Secondary | ICD-10-CM | POA: Diagnosis not present

## 2017-04-09 LAB — BASIC METABOLIC PANEL
ANION GAP: 9 (ref 5–15)
BUN: 23 mg/dL — ABNORMAL HIGH (ref 6–20)
CO2: 28 mmol/L (ref 22–32)
Calcium: 9.7 mg/dL (ref 8.9–10.3)
Chloride: 103 mmol/L (ref 101–111)
Creatinine, Ser: 1.16 mg/dL — ABNORMAL HIGH (ref 0.44–1.00)
GFR calc Af Amer: 56 mL/min — ABNORMAL LOW (ref >60)
GFR, EST NON AFRICAN AMERICAN: 49 mL/min — AB (ref >60)
GLUCOSE: 110 mg/dL — AB (ref 65–99)
Potassium: 4 mmol/L (ref 3.5–5.1)
Sodium: 140 mmol/L (ref 135–145)

## 2017-04-09 LAB — BRAIN NATRIURETIC PEPTIDE: B NATRIURETIC PEPTIDE 5: 108.9 pg/mL — AB (ref 0.0–100.0)

## 2017-04-09 LAB — POCT INR: INR: 3.9

## 2017-04-09 NOTE — Patient Instructions (Signed)
Routine lab work today. Will notify you of abnormal results, otherwise no news is good news!  No changes to medication at this time.  Follow up 4 months with Dr. Shirlee LatchMcLean.  Take all medication as prescribed the day of your appointment. Bring all medications with you to your appointment.  Do the following things EVERYDAY: 1) Weigh yourself in the morning before breakfast. Write it down and keep it in a log. 2) Take your medicines as prescribed 3) Eat low salt foods-Limit salt (sodium) to 2000 mg per day.  4) Stay as active as you can everyday 5) Limit all fluids for the day to less than 2 liters

## 2017-04-10 NOTE — Progress Notes (Signed)
Patient ID: Deborah [name], female   DOB: [birth year], 66 y.o.   MRN: [id number] PCP: Dr. Beryle Flock Cardiology: Dr. [name]  66 yo with rheumatic heart disease and MV replacement/TV repair, chronic atrial fibrillation, and chronic diastolic CHF.  Echo in 4/18 showed EF 60-65% with normally functioning mechanical mitral valve.   Weight is down 1 lb since last appointment.  She is stable clinically, short of breath walking 5-7 minutes and will stop to rest, but then can keep going.  Was able to visit [place] with her family and did pretty well on the trip.  She remains short of breath walking up a flight of steps. No chest pain.  No lightheadedness or tachypalpitations.  She remains in atrial fibrillation. She has slept with 2 pillows chronically.  She saw someone at [facility] recently and her diuretics were changed around, now taking torsemide 100 mg daily.  She has remained symptomatically stable with this change.   Labs (8/12): K 3.5, creatinine 0.98 Labs (10/12): K 3.8, creatinine 1.0, BNP 116 Labs (1/13): K 3.6, creatinine 0.9, LDL 85, HDL 62 Labs (9/13): K 3.9, creatinine 1.0 Labs (4/14): HCT 37.8 with elevated eosinophils, BNP 1094 => 82, K 3.6, creatinine 1.0 Labs (9/14): K 3.6, creatinine 0.89, hemoglobin 11 Labs (1/15): K 3.7, creatinine 0.9, HCT 36.9 Labs (2/15): LDL 99, HDL 50 Labs (3/15): K 4, creatinine 1.28 Labs (6/15): K 3.7, creatinine 1.0, BNP 124 => 80 Labs (02/22/14): K 3.5 Creatinine 0.9 Labs (10/15): K 3.3, creatinine 0.94, HCT 38.9 Labs (6/16): K 3.9, creatinine 1.1, HCT 36.4 Labs (9/16): K 3.9, creatinine 1.61, HCT 38.5 Labs (12/16): HCT 39.4, K 4.2, creatinine 1.1 Labs (1/17): K 4.2, creatinine 1.01, BNP 109, LDL 120, HDL 70 Labs (3/17): K 3.9, creatinine 1.02 Labs (4/17): K 4, creatinine 0.98, BNP 83 Labs (9/17): LDL 87, HDL 61 Labs (12/17): HCT 36.7 Labs (1/18): K 3.7, creatinine 1.09, TSH normal, hgb 11.7 Labs (4/18): K 4.1, creatinine 1.37, hgb 11.9  PMH: 1.  Rheumatic heart disease: Status post mechanical MV replacement and tricuspid repair at [facility] in 2002.  Echo 5/12 with EF 60-65%, mild AI, mechanical MV prosthesis with normal function, severe LAE, moderate RAE, moderate to severe TR, mildly elevated PA systolic pressure.  Echo (6/15) EF 55-60%, normal RV, normal mechanical mitral valve, severe LAE, moderate TR.  Echo (9/16) with EF 55-60%, mild to moderate AI, mechanical mitral valve looks ok, s/p TV repair with mild TR, PASP 40.  - Echo (4/18): EF 60-65%, mechanical mitral valve functioned normally, mild AI, repaired tricuspid valve with mild TR, no TS; PASP 33 mmHg.  2. Chronic atrial fibrillation on coumadin.  Event monitor (7/12-8/12) showed no high rate episodes (patient continuously in atrial fibrillation).  3. Chronic diastolic CHF 4. ETT-myoview (7/12): No evidence for ischemia or infarction.  5. Impaired fasting glucose 6. Hyperlipidemia  7. HTN 8. Gout 9. Anemia 10. External hemorrhoids.  11. 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  13. CKD  SH: Nonsmoker. Lives in [place].   FH: No premature CAD  ROS: All systems reviewed and negative except as noted in HPI.    Current Outpatient Prescriptions  Medication Sig Dispense Refill  . ACCU-CHEK SOFTCLIX LANCETS lancets USE AS DIRECTED TO test blood sugar 100 each 6  . acetaminophen (TYLENOL) 500 MG tablet Take 500-1,000 mg by mouth every 6 (six) hours as needed for fever (pain).    Marland Kitchen allopurinol (ZYLOPRIM) 300 MG tablet TAKE  ONE TABLET BY MOUTH EVERY DAY 30 tablet 3  . aspirin 81 MG chewable tablet Chew 81 mg by mouth daily.    . chlorpheniramine-HYDROcodone (TUSSIONEX) 10-8 MG/5ML SUER Take 5 mLs by mouth every 12 (twelve) hours as needed for cough. 140 mL 0  . cyanocobalamin 500 MCG tablet Take 1 tablet (500 mcg total) by mouth daily. 30 tablet 5  . cycloSPORINE (RESTASIS) 0.05 % ophthalmic emulsion Place 1 drop  into both eyes 2 (two) times daily.    . diazepam (VALIUM) 5 MG tablet TAKE 1 TABLET BY MOUTH EVERY 8 HOURS AS NEEDED FOR ANXIETY    . diclofenac sodium (VOLTAREN) 1 % GEL Apply 2 g topically 4 (four) times daily. (Patient taking differently: Apply 2 g topically 4 (four) times daily as needed (pain). ) 100 g 3  . folic acid (FOLVITE) 1 MG tablet Take 1 tablet (1 mg total) by mouth daily. Please contact Dr. Beryle Flock for additional refills 30 tablet 1  . glucose blood (ACCU-CHEK AVIVA PLUS) test strip USE AS INSTRUCTED TO TEST BLOOD GLUCOSE TWICE A DAY 100 each 12  . isosorbide mononitrate (IMDUR) 30 MG 24 hr tablet Take 30 mg by mouth daily.    Marland Kitchen loratadine (CLARITIN) 10 MG tablet Take 10 mg by mouth daily as needed for allergies.     Marland Kitchen meclizine (ANTIVERT) 25 MG tablet Take 1 tablet (25 mg total) by mouth 3 (three) times daily as needed for dizziness. 30 tablet 0  . Menthol-Methyl Salicylate (TIGER BALM LINIMENT EX) Apply 1 application topically daily.    . metFORMIN (GLUCOPHAGE) 500 MG tablet TAKE TWO TABLETS BY MOUTH TWICE DAILY WITH A MEAL 120 tablet 0  . metoprolol succinate (TOPROL-XL) 50 MG 24 hr tablet Take 50 mg by mouth daily. Take with or immediately following a meal.    . nitroGLYCERIN (NITROSTAT) 0.4 MG SL tablet Place 0.4 mg under the tongue every 5 (five) minutes as needed for chest pain.    Marland Kitchen Olopatadine HCl (PAZEO) 0.7 % SOLN Place 1 drop into both eyes daily as needed (itching/ irritation).    Marland Kitchen omeprazole (PRILOSEC) 20 MG capsule Take 1 capsule (20 mg total) by mouth 2 (two) times daily before a meal. 60 capsule 3  . potassium chloride SA (K-DUR,KLOR-CON) 20 MEQ tablet Take 40 mEq by mouth 2 (two) times daily.    . simvastatin (ZOCOR) 10 MG tablet Take 1 tablet (10 mg total) by mouth daily. 30 tablet 6  . torsemide (DEMADEX) 100 MG tablet Take 100 mg by mouth daily.    Marland Kitchen warfarin (COUMADIN) 2 MG tablet Take 1/2 to 1 tablet daily as directed by Coumadin clinic 90 tablet 1   No  current facility-administered medications for this encounter.     BP 128/72 (BP Location: Left Arm, Patient Position: Sitting, Cuff Size: Normal)   Pulse 89   Wt 149 lb 12.8 oz (67.9 kg)   SpO2 95%   BMI 29.26 kg/m  General: NAD Neck: JVP 7-8 cm, no thyromegaly or thyroid nodule.  Lungs: Clear to auscultation bilaterally with normal respiratory effort. CV: Nondisplaced PMI.  Heart irregular S1/S2 with mechanical S1, no S3/S4, 1/6 HSM LLSB.  No peripheral edema.  No carotid bruit.  Normal pedal pulses.  Abdomen: Soft, nontender, no hepatosplenomegaly, no distention.  Skin: Intact without lesions or rashes.  Neurologic: Alert and oriented x 3.  Psych: Normal affect. Extremities: No clubbing or cyanosis.  HEENT: Normal.   Assessment/Plan: 1. Atrial fibrillation: Chronic.  Continue  warfarin.  Continue Toprol XL 75 mg daily.     2. Mechanical mitral valve: Continue ASA 81 daily and coumadin with INR goal 2.5-3.5.  Will need bridging with heparin/Lovenox if has to come off coumadin.  Needs endocarditis prophylaxis with any dental work. Mechanical MV looked ok on echo in 4/18.  3. Diastolic CHF:  Volume status looks ok despite recent diuretic change.   - Can continue torsemide 100 mg daily.  - BMET today.  4. Hyperlipidemia: Good lipids in 9/17.  Followup in 4 months.   Deborah [name] 04/10/2017

## 2017-04-12 ENCOUNTER — Other Ambulatory Visit: Payer: Self-pay | Admitting: Internal Medicine

## 2017-04-12 DIAGNOSIS — M25561 Pain in right knee: Secondary | ICD-10-CM | POA: Insufficient documentation

## 2017-04-12 DIAGNOSIS — M25562 Pain in left knee: Secondary | ICD-10-CM

## 2017-04-12 NOTE — Assessment & Plan Note (Signed)
Findings likely consistent with OA.  Infectious etiology unlikely given afebrile, joints not warm/swollen and lack of systemic symptoms.  Without prior imaging.  -Will obtain plain films of bilateral knees -Expect improvement with NSAIDs however patient taking Coumadin for mitral valve replacement and therefore will avoid -Tylenol as needed for now -Recommend continue warm water/heating pad for comfort -will follow up films to delineate further treatment plan

## 2017-04-15 MED ORDER — METOPROLOL SUCCINATE ER 50 MG PO TB24: 50.0000 mg | ORAL_TABLET | Freq: Every day | ORAL | 3 refills | Status: DC

## 2017-04-15 MED ORDER — ALLOPURINOL 300 MG PO TABS: 300.0000 mg | ORAL_TABLET | Freq: Every day | ORAL | 3 refills | Status: DC

## 2017-04-24 ENCOUNTER — Other Ambulatory Visit: Payer: Self-pay | Admitting: Internal Medicine

## 2017-04-24 ENCOUNTER — Other Ambulatory Visit: Payer: Self-pay | Admitting: Family Medicine

## 2017-04-30 ENCOUNTER — Ambulatory Visit (INDEPENDENT_AMBULATORY_CARE_PROVIDER_SITE_OTHER): Payer: Medicaid Other | Admitting: *Deleted

## 2017-04-30 DIAGNOSIS — I482 Chronic atrial fibrillation, unspecified: Secondary | ICD-10-CM

## 2017-04-30 DIAGNOSIS — Z5181 Encounter for therapeutic drug level monitoring: Secondary | ICD-10-CM | POA: Diagnosis not present

## 2017-04-30 DIAGNOSIS — I059 Rheumatic mitral valve disease, unspecified: Secondary | ICD-10-CM | POA: Diagnosis not present

## 2017-04-30 DIAGNOSIS — Z7901 Long term (current) use of anticoagulants: Secondary | ICD-10-CM | POA: Diagnosis not present

## 2017-04-30 LAB — POCT INR: INR: 3.3

## 2017-05-20 ENCOUNTER — Other Ambulatory Visit: Payer: Self-pay | Admitting: Family Medicine

## 2017-05-20 ENCOUNTER — Other Ambulatory Visit: Payer: Self-pay | Admitting: Internal Medicine

## 2017-05-20 NOTE — Telephone Encounter (Signed)
Will forward to new PCP  Reuven Braver, Marzella Schlein, MD, MPH [facility] 05/20/2017 1:03 PM

## 2017-05-24 ENCOUNTER — Other Ambulatory Visit: Payer: Self-pay | Admitting: *Deleted

## 2017-05-28 ENCOUNTER — Ambulatory Visit (INDEPENDENT_AMBULATORY_CARE_PROVIDER_SITE_OTHER): Payer: Medicaid Other | Admitting: *Deleted

## 2017-05-28 DIAGNOSIS — Z5181 Encounter for therapeutic drug level monitoring: Secondary | ICD-10-CM

## 2017-05-28 DIAGNOSIS — Z7901 Long term (current) use of anticoagulants: Secondary | ICD-10-CM

## 2017-05-28 DIAGNOSIS — I059 Rheumatic mitral valve disease, unspecified: Secondary | ICD-10-CM

## 2017-05-28 DIAGNOSIS — I482 Chronic atrial fibrillation, unspecified: Secondary | ICD-10-CM

## 2017-05-28 DIAGNOSIS — Z952 Presence of prosthetic heart valve: Secondary | ICD-10-CM

## 2017-05-28 DIAGNOSIS — I081 Rheumatic disorders of both mitral and tricuspid valves: Secondary | ICD-10-CM

## 2017-05-28 LAB — POCT INR: INR: 2.9

## 2017-05-31 ENCOUNTER — Telehealth: Payer: Self-pay | Admitting: Family Medicine

## 2017-06-03 ENCOUNTER — Ambulatory Visit (INDEPENDENT_AMBULATORY_CARE_PROVIDER_SITE_OTHER): Payer: Medicaid Other | Admitting: Family Medicine

## 2017-06-03 ENCOUNTER — Encounter: Payer: Self-pay | Admitting: Family Medicine

## 2017-06-03 ENCOUNTER — Ambulatory Visit: Payer: Medicaid Other

## 2017-06-03 VITALS — BP 122/84 | HR 66 | Temp 97.7°F | Wt 151.0 lb

## 2017-06-03 DIAGNOSIS — K760 Fatty (change of) liver, not elsewhere classified: Secondary | ICD-10-CM | POA: Diagnosis not present

## 2017-06-03 DIAGNOSIS — Z23 Encounter for immunization: Secondary | ICD-10-CM

## 2017-06-03 DIAGNOSIS — Z114 Encounter for screening for human immunodeficiency virus [HIV]: Secondary | ICD-10-CM | POA: Diagnosis not present

## 2017-06-03 DIAGNOSIS — E118 Type 2 diabetes mellitus with unspecified complications: Secondary | ICD-10-CM | POA: Diagnosis present

## 2017-06-03 LAB — POCT GLYCOSYLATED HEMOGLOBIN (HGB A1C): Hemoglobin A1C: 6

## 2017-06-03 NOTE — Patient Instructions (Signed)
Thank you for coming in to see Deborah [name] today. Please see below to review our plan for today's visit.  1. I will contact you if your lab tests are abnormal. Otherwise expect to receive your results in the mail. 2. I would advise she to call and schedule a screening colonoscopy as soon as possible. 3. Your diabetes is well controlled.  Please call the clinic at [phone] if your symptoms worsen or you have any concerns. It was my pleasure to see you. -- Durward Parcel, DO [facility], PGY-2

## 2017-06-03 NOTE — Progress Notes (Signed)
   Subjective:   Patient ID: Deborah [name]    DOB: 16-May-1951, 66 y.o. female   MRN: [id number]  CC: "Glucose and liver check"  HPI: Deborah [name] is a 66 y.o. female who presents to clinic today for glucose and liver check. Problems discussed today are as follows:  Diabetes: Patient tolerating metformin without difficulty. Glucose levels ranging 100-140 without lows or highs. Patient states she walks in the neighborhood for approximately 30 minutes 4-5 times per week. She continues to eat oatmeal and rice at higher proportions than she would like. ROS: Denies fevers or chills, change in vision, nausea or vomiting, change in motor or sensory function.  Fatty liver: Diagnosed and on 08/2016 after acute RUQ pain. Found to have fatty liver without signs of gallbladder disease. Pain resolved soon after ultrasound and has now returned since. She states she has been screened for hepatitis C. She does endorse a history of rheumatic heart disease. She has never had a screening colonoscopy. ROS: Denies fevers or chills, shortness of breath, chest pain, abdominal pain, nausea or vomiting, diarrhea or constipation, melena or hematochezia, change in weight.  Complete ROS performed, see HPI for pertinent.  [place]: Rheumatic heart disease A/P prosthetic heart valve, chronic A. fib, GERD, HLD, prediabetes, hemoglobin E disease, gout. Surgical history tricuspid valve repair, cardiac cath (EF 55%). Family history stroke, DM, valvular heart disease. Smoking status reviewed. Medications reviewed.  Objective:   BP 122/84   Pulse 66   Temp 97.7 F (36.5 C) (Oral)   Wt 151 lb (68.5 kg)   SpO2 98%   BMI 29.49 kg/m  Vitals and nursing note reviewed.  General: well nourished, well developed, in no acute distress with non-toxic appearance HEENT: normocephalic, atraumatic, moist mucous membranes Neck: supple, non-tender without lymphadenopathy CV: regular rate and rhythm without murmurs, rubs, or gallops,  no lower extremity edema Lungs: clear to auscultation bilaterally with normal work of breathing Abdomen: soft, non-tender, non-distended, no masses or organomegaly palpable, normoactive bowel sounds Skin: warm, dry, no rashes or lesions, cap refill < 2 seconds Extremities: warm and well perfused, normal tone  Assessment & Plan:   DM (diabetes mellitus) [place]. Stable. A1c 6.0. On metformin only. Checks glucose levels regularly ranging 100-140. No lows. --Continue metformin 1,000 mg twice daily --Discussed with patient checking glucose is futile given good control w/o insulin or agent that would cause hypoglycemia though pt prefers checking for reassurance  NAFLD (nonalcoholic fatty liver disease) Chronic. No history of elevation in liver enzymes. Prior screening for hepatitis panel.  --Will obtain CMET to check LFT given last results prior to [place] and h/o DM --Screen for HIV, Hep C neg on record --Form given for screening colonoscopy, pt to schedule  Orders Placed This Encounter  Procedures  . CMP14+EGFR  . HIV antibody (with reflex)  . POCT HgB A1C (CPT 83036)   No orders of the defined types were placed in this encounter.   Harriet Butte, [place], PGY-2 06/03/2017 4:37 PM

## 2017-06-03 NOTE — Assessment & Plan Note (Addendum)
[place]. Stable. A1c 6.0. On metformin only. Checks glucose levels regularly ranging 100-140. No lows. --Continue metformin 1,000 mg twice daily --Discussed with patient checking glucose is futile given good control w/o insulin or agent that would cause hypoglycemia though pt prefers checking for reassurance

## 2017-06-03 NOTE — Assessment & Plan Note (Addendum)
Chronic. No history of elevation in liver enzymes. Prior screening for hepatitis panel.  --Will obtain CMET to check LFT given last results prior to [place] and h/o DM --Screen for HIV, Hep C neg on record --Form given for screening colonoscopy, pt to schedule

## 2017-06-04 LAB — CMP14+EGFR
ALBUMIN: 4.9 g/dL — AB (ref 3.6–4.8)
ALT: 26 IU/L (ref 0–32)
AST: 22 IU/L (ref 0–40)
Albumin/Globulin Ratio: 2 (ref 1.2–2.2)
Alkaline Phosphatase: 75 IU/L (ref 39–117)
BUN / CREAT RATIO: 16 (ref 12–28)
BUN: 22 mg/dL (ref 8–27)
Bilirubin Total: 1.2 mg/dL (ref 0.0–1.2)
CALCIUM: 9.8 mg/dL (ref 8.7–10.3)
CO2: 23 mmol/L (ref 20–29)
CREATININE: 1.35 mg/dL — AB (ref 0.57–1.00)
Chloride: 101 mmol/L (ref 96–106)
GFR calc Af Amer: 48 mL/min/{1.73_m2} — ABNORMAL LOW (ref >59)
GFR calc non Af Amer: 42 mL/min/{1.73_m2} — ABNORMAL LOW (ref >59)
GLUCOSE: 96 mg/dL (ref 65–99)
Globulin, Total: 2.5 g/dL (ref 1.5–4.5)
Potassium: 4.1 mmol/L (ref 3.5–5.2)
Sodium: 143 mmol/L (ref 134–144)
TOTAL PROTEIN: 7.4 g/dL (ref 6.0–8.5)

## 2017-06-04 LAB — HIV ANTIBODY (ROUTINE TESTING W REFLEX): HIV SCREEN 4TH GENERATION: NONREACTIVE

## 2017-06-05 ENCOUNTER — Other Ambulatory Visit: Payer: Self-pay | Admitting: Internal Medicine

## 2017-06-05 ENCOUNTER — Encounter: Payer: Self-pay | Admitting: Family Medicine

## 2017-06-06 ENCOUNTER — Telehealth: Payer: Self-pay | Admitting: *Deleted

## 2017-06-06 NOTE — Telephone Encounter (Signed)
Received  Request for prior authorization for patients WARFARIN through covermymeds. I called medicaid and they state this does not need prior authorization but needs pharmacy to do override on pharmacy side add the number 2 in submission clarification field.   Called [facility] and spoke with pharmacist and gave them this information.

## 2017-06-19 ENCOUNTER — Other Ambulatory Visit (HOSPITAL_COMMUNITY): Payer: Self-pay | Admitting: *Deleted

## 2017-06-19 MED ORDER — MECLIZINE HCL 25 MG PO TABS: 25.0000 mg | ORAL_TABLET | Freq: Three times a day (TID) | ORAL | 0 refills | Status: DC | PRN

## 2017-06-19 MED ORDER — SIMVASTATIN 10 MG PO TABS: 10.0000 mg | ORAL_TABLET | Freq: Every day | ORAL | 6 refills | Status: DC

## 2017-06-20 ENCOUNTER — Other Ambulatory Visit: Payer: Self-pay | Admitting: Internal Medicine

## 2017-06-20 ENCOUNTER — Other Ambulatory Visit: Payer: Self-pay | Admitting: Family Medicine

## 2017-06-21 ENCOUNTER — Other Ambulatory Visit (HOSPITAL_COMMUNITY): Payer: Self-pay | Admitting: *Deleted

## 2017-06-21 MED ORDER — FOLIC ACID 1 MG PO TABS: 1.0000 mg | ORAL_TABLET | Freq: Every day | ORAL | 3 refills | Status: DC

## 2017-06-21 MED ORDER — SIMVASTATIN 10 MG PO TABS: 10.0000 mg | ORAL_TABLET | Freq: Every day | ORAL | 6 refills | Status: DC

## 2017-06-21 MED ORDER — MECLIZINE HCL 25 MG PO TABS: 25.0000 mg | ORAL_TABLET | Freq: Three times a day (TID) | ORAL | 3 refills | Status: DC | PRN

## 2017-06-24 ENCOUNTER — Telehealth: Payer: Self-pay | Admitting: Family Medicine

## 2017-06-24 NOTE — Telephone Encounter (Signed)
Needs test results from labs done on October 8th.  You can mail them to the patient. She needs asap for her insurance.  Please call pt when you mail it, at # [phone].

## 2017-06-27 ENCOUNTER — Telehealth: Payer: Self-pay | Admitting: Family Medicine

## 2017-06-27 NOTE — Telephone Encounter (Signed)
I gave patient's son a DPR to get the patient to fill out and sign.  He will bring it back but needs to get this referral in to liver specialist for patient asap.  He has already been waiting months and patient is in pain.  (pt does not speak good [place] and son has brought her before).Please set up referral so it is ready when he brings form back tomorrow.  Son's # for future reference is # [phone].  (You may try to call patient for now)

## 2017-06-27 NOTE — Telephone Encounter (Signed)
Labs mailed to patient address

## 2017-06-27 NOTE — Telephone Encounter (Signed)
For future reference, please make sure anyone speaking for a patient is on a DPR for that patient, because this patient has been waiting for an answer about referral for over 9 months (and has been in pain that long), and was seen by Dr. B many months ago.

## 2017-06-28 ENCOUNTER — Other Ambulatory Visit: Payer: Self-pay | Admitting: Family Medicine

## 2017-06-28 DIAGNOSIS — R1011 Right upper quadrant pain: Secondary | ICD-10-CM

## 2017-06-28 NOTE — Telephone Encounter (Signed)
Thanks    Tia

## 2017-06-28 NOTE — Telephone Encounter (Signed)
[facility] at [phone]. Patient will be contacted directly to schedule the appointment.

## 2017-06-28 NOTE — Telephone Encounter (Signed)
Discussed with Tia and referral was not previously placed.  Have sent referral today.  Please inform patient or patient's son that she can expect a call within the next week to set up an appointment for liver specialist.

## 2017-07-09 ENCOUNTER — Ambulatory Visit (INDEPENDENT_AMBULATORY_CARE_PROVIDER_SITE_OTHER): Payer: Medicaid Other | Admitting: *Deleted

## 2017-07-09 DIAGNOSIS — I059 Rheumatic mitral valve disease, unspecified: Secondary | ICD-10-CM | POA: Diagnosis not present

## 2017-07-09 DIAGNOSIS — Z7901 Long term (current) use of anticoagulants: Secondary | ICD-10-CM

## 2017-07-09 DIAGNOSIS — Z5181 Encounter for therapeutic drug level monitoring: Secondary | ICD-10-CM | POA: Diagnosis not present

## 2017-07-09 LAB — POCT INR: INR: 3.7

## 2017-07-09 NOTE — Patient Instructions (Signed)
Today take only 1/2 tablet, then continue same dose of  Coumadin 1 tablet (2mg ) daily except 1/2 tablet (1mg ) on Mondays and Thursdays.  Recheck in 4 weeks.

## 2017-07-12 NOTE — Progress Notes (Signed)
[facility] OFFICE PROGRESS NOTE  Freddrick MarchAmin, Yashika, MD [address] [place] [id number]  DIAGNOSIS: Hemoglobin E disease (HCC)  CHIEF COMPLAIN: follow up    INTERVAL HISTORY:  Deborah [name] 66 y.o. 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 She was last seen by me 12 months ago.  She presents to the clinic today noting a cough with white and at times red with blood phlegm, due to coumadin. This started yesterday. She is afebrile. She does not have a cold, but possible allergy. She notes pain over her liver still remains.  She went to see her PCP and asked for Abd US. Her adult children have not gotten tested for this. She takes Folic Acid and B12 daily. She likes to return to clinic yearly.     MEDICAL HISTORY: Past Medical History:  Diagnosis Date  . Allergic rhinitis   . Anemia   . Borderline diabetes   . Carpal tunnel syndrome   . Chronic atrial fibrillation (HCC)    [facility];  Event montor (7/12-8/12) showed no high rate episodes (patient continuously in atrial fibrillation).   . Chronic diastolic heart failure (HCC)   . Eosinophilia 01/27/2013  . External hemorrhoid   . External hemorrhoids   . Fatigue   . Gout   . 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 Gaylyn RongHa  . Hx of cardiovascular stress test    ETT-myoview (7/12): No evidence for ischemia or infarction  . Hyperlipidemia   . Hypertension   . Obesity   . Rheumatic heart disease    Status post mechanical ([employer]) mitral valve replacement and tricuspid valve repair at [facility] in 2002; echo 5/12:   EF 60-65%, mild AI, mitral valve prosthesis with AVA 1.66, severe LAE, moderate RAE, moderate to severe TR, mild increased pulmonary artery systolic pressure;    Adenosine Cardiolite in 4/09:   No ischemia, EF 66%  . 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    ALLERGIES:  is allergic to promethazine hcl.  MEDICATIONS: has a current medication list which includes the following prescription(s): accu-chek aviva plus, acetaminophen, allopurinol, aspirin, cyanocobalamin, cyclosporine, diazepam, diclofenac sodium, folic acid, isosorbide mononitrate, loratadine, menthol-methyl salicylate, metformin, metoprolol succinate, olopatadine hcl, omeprazole, potassium chloride sa, simvastatin, torsemide, warfarin, meclizine, and nitroglycerin.  SURGICAL HISTORY:  Past Surgical History:  Procedure Laterality Date  . CARDIAC CATHETERIZATION     EF 55%  . TRICUSPID VALVE REPAIR      REVIEW OF SYSTEMS:   Constitutional: Denies fevers, chills or abnormal weight loss Eyes: Denies blurriness of vision Ears, nose, mouth, throat, and face: Denies mucositis or sore throat Respiratory: Denies dyspnea or wheezes (+) cough with Phlegm, occasionally red due to coumadin.  Cardiovascular: Denies palpitation, chest discomfort or lower extremity swelling Gastrointestinal:  Denies nausea, heartburn or change in bowel habits (+) right abdominal pain  Skin: Denies  abnormal skin rashes Lymphatics: Denies new lymphadenopathy or easy bruising Neurological:Denies numbness, tingling or new weaknesses Behavioral/Psych: Mood is stable, no new changes  All other systems were reviewed with the patient and are negative.  PHYSICAL EXAMINATION: ECOG PERFORMANCE STATUS: 1 - Symptomatic but completely ambulatory  Blood pressure 127/84, pulse 78, temperature 97.8 F (36.6 C), temperature source Oral, resp. rate 19, height 5' (1.524 m), weight 154 lb 3.2 oz (69.9 kg), SpO2 98 %.  GENERAL:alert, no distress and comfortable SKIN: skin color, texture, turgor are normal, no rashes or significant lesions EYES: normal, Conjunctiva are pink and non-injected, sclera clear OROPHARYNX:no exudate, no erythema and lips, buccal mucosa, and tongue normal  NECK: supple, thyroid normal size, non-tender, without nodularity LYMPH:  no palpable lymphadenopathy in the cervical, axillary or supraclavicular LUNGS: clear to auscultation with normal breathing effort, no wheezes or rhonchi HEART: regular rate & rhythm and no murmurs and no lower extremity edema ABDOMEN:abdomen soft, mild tenderness in the right upper quadrant abdomen, no hepatomegaly, normal bowel sounds Musculoskeletal:no cyanosis of digits and no clubbing  NEURO: alert & oriented x 3 with fluent speech, no focal motor/sensory deficits  Labs:   CBC Latest Ref Rng & Units 07/15/2017 12/24/2016 08/14/2016  WBC 3.9 - 10.3 10e3/uL 7.6 7.3 6.4  Hemoglobin 11.6 - 15.9 g/dL 81.1 11.9(L) 11.7  Hematocrit 34.8 - 46.6 % 37.4 37.2 36.7  Platelets 145 - 400 10e3/uL 182 145(L) 133(L)    CMP Latest Ref Rng & Units 07/15/2017 06/03/2017 04/09/2017  Glucose 70 - 140 mg/dl 914 96 782(N)  BUN 7.0 - 26.0 mg/dL 30.2(H) 22 23(H)  Creatinine 0.6 - 1.1 mg/dL 5.6(O) 1.30(Q) 6.57(Q)  Sodium 136 - 145 mEq/L 142 143 140  Potassium 3.5 - 5.1 mEq/L 4.3 4.1 4.0  Chloride 96 - 106 mmol/L - 101 103  CO2 22 - 29 mEq/L 29 23 28   Calcium 8.4 -  10.4 mg/dL 46.9 9.8 9.7  Total Protein 6.4 - 8.3 g/dL 8.1 7.4 -  Total Bilirubin 0.20 - 1.20 mg/dL 6.29(B) 1.2 -  Alkaline Phos 40 - 150 U/L 80 75 -  AST 5 - 34 U/L 26 22 -  ALT 0 - 55 U/L 28 26 -     RADIOGRAPHIC STUDIES: No results found.  Abdominal US 09/03/16:  IMPRESSION: There is echogenic consistent fatty infiltration and/or hepatocellular disease. No acute abnormality identified.   ASSESSMENT: Deborah [name] 66 y.o. female with    1. Hemoglobin E and Constant Spring:  - stable. 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 No transfusion is indicated. -I recommend her to continue B12 and folic acid supplement due to her mild hemolysis process. -We discussed this is a genetic disorder with autosomal dominant pattern, her children should be screened also.  -monitor her CBC every 6 month. -Labs reviewed, no clubbing 0.9, overall stable, and I explained the hemolysis of her RBCs will increase her tbili, overall stable -I encouraged her to speak with her children about getting tested for this blood disorder.  -F/u in 1 year    2. Right upper abdominal pain  -She has had a few episodes of right upper abdominal pain, mild tenderness on exam -I'll obtain a abdominal ultrasound to ruled out gallbladder stone or lesions. Her last US showed fatty liver in 2016. Due to her chronic mild hemolysis,  she is at risk for gallbladder stone. -Her 09/03/16 US showed fatty liver disease. No acute abnormalities.  -Ongoing mild pain but tolerable.   3. Elevated bili -Secondary to mild hemolysis  -US of abdomen in May 2016 showed Fatty liver.  -I encouraged her to continue exercise, and try to lose some weight. She is on cholesterol pill.  4. Cancer screening -she is doing annual screening mammogram -She has not had screening colonoscopy, no family history of colon cancer. I recommend her to consider, she is still reluctant to do this the fact that she is on Coumadin. She  will discuss with her primary care physician.  5. Cough  --She has a new cough with clear phlegm for 1-2 days, afebrile. I have prescribed Cough Medication Tussin   Plan:  -Labs reviewed, stable overall  -Prescribed Tussin for her cough today  -Lab and f/u in 1 year     All questions were answered. The patient knows to call the clinic with any problems, questions or concerns. We can certainly see the patient much sooner if necessary.  I spent 20 minutes counseling the patient face to face. The total time spent in the appointment was 25 minutes.    Malachy MoodFeng, Angelee Bahr, MD 07/15/2017  8:43 PM   This document serves as a record of services personally performed by Malachy MoodYan Dhamar Gregory, MD. It was created on her behalf by Delphina CahillAmoya Bennett, a trained medical scribe. The creation of this record is based on the scribe's personal observations and the provider's statements to them.    I have reviewed the above documentation for accuracy and completeness, and I agree with the above.

## 2017-07-15 ENCOUNTER — Encounter: Payer: Self-pay | Admitting: Hematology

## 2017-07-15 ENCOUNTER — Other Ambulatory Visit (HOSPITAL_BASED_OUTPATIENT_CLINIC_OR_DEPARTMENT_OTHER): Payer: Medicaid Other

## 2017-07-15 ENCOUNTER — Telehealth: Payer: Self-pay | Admitting: Hematology

## 2017-07-15 ENCOUNTER — Ambulatory Visit (HOSPITAL_BASED_OUTPATIENT_CLINIC_OR_DEPARTMENT_OTHER): Payer: Medicaid Other | Admitting: Hematology

## 2017-07-15 VITALS — BP 127/84 | HR 78 | Temp 97.8°F | Resp 19 | Ht 60.0 in | Wt 154.2 lb

## 2017-07-15 DIAGNOSIS — R05 Cough: Secondary | ICD-10-CM | POA: Diagnosis not present

## 2017-07-15 DIAGNOSIS — D582 Other hemoglobinopathies: Secondary | ICD-10-CM | POA: Diagnosis present

## 2017-07-15 DIAGNOSIS — R109 Unspecified abdominal pain: Secondary | ICD-10-CM

## 2017-07-15 LAB — CBC & DIFF AND RETIC
BASO%: 0.7 % (ref 0.0–2.0)
Basophils Absolute: 0.1 10*3/uL (ref 0.0–0.1)
EOS%: 3.4 % (ref 0.0–7.0)
Eosinophils Absolute: 0.3 10*3/uL (ref 0.0–0.5)
HCT: 37.4 % (ref 34.8–46.6)
HGB: 11.8 g/dL (ref 11.6–15.9)
IMMATURE RETIC FRACT: 9.9 % (ref 1.60–10.00)
LYMPH#: 2.4 10*3/uL (ref 0.9–3.3)
LYMPH%: 31.8 % (ref 14.0–49.7)
MCH: 23.6 pg — ABNORMAL LOW (ref 25.1–34.0)
MCHC: 31.6 g/dL (ref 31.5–36.0)
MCV: 74.9 fL — AB (ref 79.5–101.0)
MONO#: 0.8 10*3/uL (ref 0.1–0.9)
MONO%: 11 % (ref 0.0–14.0)
NEUT%: 53.1 % (ref 38.4–76.8)
NEUTROS ABS: 4.1 10*3/uL (ref 1.5–6.5)
PLATELETS: 182 10*3/uL (ref 145–400)
RBC: 4.99 10*6/uL (ref 3.70–5.45)
RDW: 16.5 % — ABNORMAL HIGH (ref 11.2–14.5)
RETIC CT ABS: 122.75 10*3/uL — AB (ref 33.70–90.70)
Retic %: 2.46 % — ABNORMAL HIGH (ref 0.70–2.10)
WBC: 7.6 10*3/uL (ref 3.9–10.3)

## 2017-07-15 LAB — COMPREHENSIVE METABOLIC PANEL
ALT: 28 U/L (ref 0–55)
ANION GAP: 10 meq/L (ref 3–11)
AST: 26 U/L (ref 5–34)
Albumin: 4.2 g/dL (ref 3.5–5.0)
Alkaline Phosphatase: 80 U/L (ref 40–150)
BUN: 30.2 mg/dL — ABNORMAL HIGH (ref 7.0–26.0)
CO2: 29 meq/L (ref 22–29)
Calcium: 10.2 mg/dL (ref 8.4–10.4)
Chloride: 103 mEq/L (ref 98–109)
Creatinine: 1.5 mg/dL — ABNORMAL HIGH (ref 0.6–1.1)
EGFR: 37 mL/min/{1.73_m2} — AB (ref >60)
GLUCOSE: 125 mg/dL (ref 70–140)
Potassium: 4.3 mEq/L (ref 3.5–5.1)
SODIUM: 142 meq/L (ref 136–145)
Total Bilirubin: 1.24 mg/dL — ABNORMAL HIGH (ref 0.20–1.20)
Total Protein: 8.1 g/dL (ref 6.4–8.3)

## 2017-07-15 MED ORDER — DEXTROMETHORPHAN-GUAIFENESIN 10-100 MG/5ML PO LIQD: 5.0000 mL | ORAL | 0 refills | Status: DC | PRN

## 2017-07-15 NOTE — Telephone Encounter (Signed)
Gave avs and calendar for November 2019 °

## 2017-08-02 ENCOUNTER — Other Ambulatory Visit (HOSPITAL_COMMUNITY): Payer: Self-pay | Admitting: Internal Medicine

## 2017-08-09 ENCOUNTER — Ambulatory Visit (INDEPENDENT_AMBULATORY_CARE_PROVIDER_SITE_OTHER): Payer: Medicaid Other | Admitting: *Deleted

## 2017-08-09 ENCOUNTER — Ambulatory Visit (HOSPITAL_COMMUNITY)
Admission: RE | Admit: 2017-08-09 | Discharge: 2017-08-09 | Disposition: A | Discharge status: Home / Self Care | Payer: Medicaid Other | Source: Ambulatory Visit | Attending: Cardiology | Admitting: Cardiology

## 2017-08-09 ENCOUNTER — Encounter (HOSPITAL_COMMUNITY): Payer: Self-pay | Admitting: Cardiology

## 2017-08-09 VITALS — BP 128/76 | HR 86 | Wt 154.8 lb

## 2017-08-09 DIAGNOSIS — E785 Hyperlipidemia, unspecified: Secondary | ICD-10-CM | POA: Insufficient documentation

## 2017-08-09 DIAGNOSIS — I130 Hypertensive heart and chronic kidney disease with heart failure and stage 1 through stage 4 chronic kidney disease, or unspecified chronic kidney disease: Secondary | ICD-10-CM | POA: Insufficient documentation

## 2017-08-09 DIAGNOSIS — K760 Fatty (change of) liver, not elsewhere classified: Secondary | ICD-10-CM | POA: Diagnosis not present

## 2017-08-09 DIAGNOSIS — Z79899 Other long term (current) drug therapy: Secondary | ICD-10-CM | POA: Insufficient documentation

## 2017-08-09 DIAGNOSIS — N189 Chronic kidney disease, unspecified: Secondary | ICD-10-CM | POA: Insufficient documentation

## 2017-08-09 DIAGNOSIS — I059 Rheumatic mitral valve disease, unspecified: Secondary | ICD-10-CM

## 2017-08-09 DIAGNOSIS — I482 Chronic atrial fibrillation, unspecified: Secondary | ICD-10-CM

## 2017-08-09 DIAGNOSIS — Z7901 Long term (current) use of anticoagulants: Secondary | ICD-10-CM

## 2017-08-09 DIAGNOSIS — Z7984 Long term (current) use of oral hypoglycemic drugs: Secondary | ICD-10-CM | POA: Insufficient documentation

## 2017-08-09 DIAGNOSIS — I099 Rheumatic heart disease, unspecified: Secondary | ICD-10-CM | POA: Insufficient documentation

## 2017-08-09 DIAGNOSIS — Z7982 Long term (current) use of aspirin: Secondary | ICD-10-CM | POA: Diagnosis not present

## 2017-08-09 DIAGNOSIS — Z5181 Encounter for therapeutic drug level monitoring: Secondary | ICD-10-CM | POA: Diagnosis not present

## 2017-08-09 DIAGNOSIS — I5032 Chronic diastolic (congestive) heart failure: Secondary | ICD-10-CM | POA: Insufficient documentation

## 2017-08-09 DIAGNOSIS — Z9889 Other specified postprocedural states: Secondary | ICD-10-CM | POA: Insufficient documentation

## 2017-08-09 DIAGNOSIS — R7301 Impaired fasting glucose: Secondary | ICD-10-CM | POA: Diagnosis not present

## 2017-08-09 DIAGNOSIS — E1169 Type 2 diabetes mellitus with other specified complication: Secondary | ICD-10-CM | POA: Diagnosis not present

## 2017-08-09 DIAGNOSIS — Z952 Presence of prosthetic heart valve: Secondary | ICD-10-CM | POA: Insufficient documentation

## 2017-08-09 DIAGNOSIS — M109 Gout, unspecified: Secondary | ICD-10-CM | POA: Diagnosis not present

## 2017-08-09 LAB — BASIC METABOLIC PANEL
ANION GAP: 8 (ref 5–15)
BUN: 20 mg/dL (ref 6–20)
CALCIUM: 8.8 mg/dL — AB (ref 8.9–10.3)
CO2: 24 mmol/L (ref 22–32)
Chloride: 107 mmol/L (ref 101–111)
Creatinine, Ser: 1.22 mg/dL — ABNORMAL HIGH (ref 0.44–1.00)
GFR, EST AFRICAN AMERICAN: 53 mL/min — AB (ref >60)
GFR, EST NON AFRICAN AMERICAN: 46 mL/min — AB (ref >60)
GLUCOSE: 193 mg/dL — AB (ref 65–99)
POTASSIUM: 3.7 mmol/L (ref 3.5–5.1)
Sodium: 139 mmol/L (ref 135–145)

## 2017-08-09 LAB — LIPID PANEL
CHOL/HDL RATIO: 3.3 ratio
CHOLESTEROL: 183 mg/dL (ref 0–200)
HDL: 55 mg/dL (ref >40)
LDL CALC: 91 mg/dL (ref 0–99)
TRIGLYCERIDES: 186 mg/dL — AB (ref <150)
VLDL: 37 mg/dL (ref 0–40)

## 2017-08-09 LAB — CBC
HCT: 34.5 % — ABNORMAL LOW (ref 36.0–46.0)
HEMOGLOBIN: 10.9 g/dL — AB (ref 12.0–15.0)
MCH: 23.1 pg — ABNORMAL LOW (ref 26.0–34.0)
MCHC: 31.6 g/dL (ref 30.0–36.0)
MCV: 73.1 fL — ABNORMAL LOW (ref 78.0–100.0)
Platelets: 175 10*3/uL (ref 150–400)
RBC: 4.72 MIL/uL (ref 3.87–5.11)
RDW: 16 % — ABNORMAL HIGH (ref 11.5–15.5)
WBC: 6.2 10*3/uL (ref 4.0–10.5)

## 2017-08-09 LAB — POCT INR: INR: 3.1

## 2017-08-09 NOTE — Patient Instructions (Signed)
Labs drawn today (if we do not call you, then your lab work was stable)   Your physician recommends that you schedule a follow-up appointment in: 3 months with Dr. McLean    

## 2017-08-09 NOTE — Patient Instructions (Signed)
Description   Continue same dose of  Coumadin 1 tablet (2mg) daily except 1/2 tablet (1mg) on Mondays and Thursdays.  Recheck in 4 weeks.      

## 2017-08-11 NOTE — Progress Notes (Signed)
Patient ID: Deborah [name], female   DOB: 02/26/1951, 66 y.o.   MRN: [id number] PCP: Dr. Beryle FlockBacigalupo Cardiology: Dr. Shirlee LatchMcLean  66 yo with rheumatic heart disease and MV replacement/TV repair, chronic atrial fibrillation, and chronic diastolic CHF.  Echo in 4/18 showed EF 60-65% with normally functioning mechanical mitral valve.   She returns for followup of CHF. Weight is up about 5 lbs but was wearing coat and boots.  Weight is stable at home. Dyspnea is unchanged, only notices when walking up steep incline or stairs. No problems on flat ground.  No orthopnea/PND.  She had an episode of chest pain lasting several hours (unrelated to exertion) 2-3 weeks ago but has had no recurrence.    Labs (8/12): K 3.5, creatinine 0.98 Labs (10/12): K 3.8, creatinine 1.0, BNP 116 Labs (1/13): K 3.6, creatinine 0.9, LDL 85, HDL 62 Labs (9/13): K 3.9, creatinine 1.0 Labs (4/14): HCT 37.8 with elevated eosinophils, BNP 1094 => 82, K 3.6, creatinine 1.0 Labs (9/14): K 3.6, creatinine 0.89, hemoglobin 11 Labs (1/15): K 3.7, creatinine 0.9, HCT 36.9 Labs (2/15): LDL 99, HDL 50 Labs (3/15): K 4, creatinine 1.28 Labs (6/15): K 3.7, creatinine 1.0, BNP 124 => 80 Labs (02/22/14): K 3.5 Creatinine 0.9 Labs (10/15): K 3.3, creatinine 0.94, HCT 38.9 Labs (6/16): K 3.9, creatinine 1.1, HCT 36.4 Labs (9/16): K 3.9, creatinine 1.61, HCT 38.5 Labs (12/16): HCT 39.4, K 4.2, creatinine 1.1 Labs (1/17): K 4.2, creatinine 1.01, BNP 109, LDL 120, HDL 70 Labs (3/17): K 3.9, creatinine 1.02 Labs (4/17): K 4, creatinine 0.98, BNP 83 Labs (9/17): LDL 87, HDL 61 Labs (12/17): HCT 36.7 Labs (1/18): K 3.7, creatinine 1.09, TSH normal, hgb 11.7 Labs (4/18): K 4.1, creatinine 1.37, hgb 11.9 Labs (11/18): K 4.3, creatinine 1.5, tbili 1.2, ALT/AST normal  PMH: 1. Rheumatic heart disease: Status post mechanical MV replacement and [facility]pid repair at [facility] in [facility].  Echo 5/12 with EF 60-65%, mild AI, mechanical MV prosthesis with normal  function, severe LAE, moderate RAE, moderate to severe TR, mildly elevated PA systolic pressure.  Echo (6/15) EF 55-60%, normal RV, normal mechanical mitral valve, severe LAE, moderate TR.  Echo (9/16) with EF 55-60%, mild to moderate AI, mechanical mitral valve looks ok, s/p TV repair with mild TR, PASP 40.  - Echo (4/18): EF 60-65%, mechanical mitral valve functioned normally, mild AI, repaired tricuspid valve with mild TR, no TS; PASP 33 mmHg.  2. Chronic atrial fibrillation on coumadin.  Event monitor (7/12-8/12) showed no high rate episodes (patient continuously in atrial fibrillation).  3. Chronic diastolic CHF 4. ETT-myoview (7/12): No evidence for ischemia or infarction.  5. Impaired fasting glucose 6. Hyperlipidemia  7. HTN 8. Gout 9. Anemia 10. External hemorrhoids.  11. 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  13. CKD 14. Fatty liver  SH: Nonsmoker. [place] in [place]   FH: No premature CAD  ROS: All systems reviewed and negative except as noted in HPI.    Current Outpatient Medications  Medication Sig Dispense Refill  . ACCU-CHEK SOFTCLIX LANCETS lancets USE AS DIRECTED TO test blood sugar 100 each 6  . acetaminophen (TYLENOL) 500 MG tablet Take 500-1,000 mg by mouth every 6 (six) hours as needed for fever (pain).    Marland Kitchen. allopurinol (ZYLOPRIM) 300 MG tablet TAKE one tablet BY MOUTH EVERY DAY 30 tablet 11  . aspirin 81 MG chewable tablet Chew 81 mg by mouth daily.    .Marland Kitchen  cyanocobalamin 500 MCG tablet Take 1 tablet (500 mcg total) by mouth daily. 30 tablet 5  . cycloSPORINE (RESTASIS) 0.05 % ophthalmic emulsion Place 1 drop into both eyes 2 (two) times daily.    Marland Kitchen. dextromethorphan-guaiFENesin (TUSSIN DM) 10-100 MG/5ML liquid Take 5 mLs every 4 (four) hours as needed by mouth for cough. 237 mL 0  . diazepam (VALIUM) 5 MG tablet TAKE 1 TABLET BY MOUTH EVERY 8 HOURS AS NEEDED FOR ANXIETY    . diclofenac sodium  (VOLTAREN) 1 % GEL Apply 2 g topically 4 (four) times daily. (Patient taking differently: Apply 2 g topically 4 (four) times daily as needed (pain). ) 100 g 3  . folic acid (FOLVITE) 1 MG tablet Take 1 tablet (1 mg total) by mouth daily. 30 tablet 3  . glucose blood (ACCU-CHEK AVIVA PLUS) test strip USE AS INSTRUCTED TO TEST BLOOD GLUCOSE TWICE A DAY 100 each 12  . isosorbide mononitrate (IMDUR) 30 MG 24 hr tablet Take 30 mg by mouth daily.    Marland Kitchen. loratadine (CLARITIN) 10 MG tablet Take 10 mg by mouth daily as needed for allergies.     Marland Kitchen. meclizine (ANTIVERT) 25 MG tablet Take 1 tablet (25 mg total) by mouth 3 (three) times daily as needed for dizziness. CONTACT PCP FOR FURTHER REFILLS 30 tablet 3  . Menthol-Methyl Salicylate (TIGER BALM LINIMENT EX) Apply 1 application topically daily.    . metFORMIN (GLUCOPHAGE) 500 MG tablet TAKE TWO TABLETS BY MOUTH TWICE DAILY WITH A MEAL 120 tablet 0  . metoprolol succinate (TOPROL-XL) 25 MG 24 hr tablet TAKE THREE TABLETS BY MOUTH DAILY (Patient taking differently: Takes 50 mg Daily and takes and extra 25 mg as needed for tachycardia) 90 tablet 6  . metoprolol succinate (TOPROL-XL) 50 MG 24 hr tablet Take 1 tablet (50 mg total) by mouth daily. Take with or immediately following a meal. 30 tablet 3  . nitroGLYCERIN (NITROSTAT) 0.4 MG SL tablet Place 0.4 mg under the tongue every 5 (five) minutes as needed for chest pain.    Marland Kitchen. Olopatadine HCl (PAZEO) 0.7 % SOLN Place 1 drop into both eyes daily as needed (itching/ irritation).    Marland Kitchen. omeprazole (PRILOSEC) 20 MG capsule Take 1 capsule (20 mg total) by mouth 2 (two) times daily before a meal. 60 capsule 2  . potassium chloride SA (K-DUR,KLOR-CON) 20 MEQ tablet Take 40 mEq by mouth 2 (two) times daily.    . simvastatin (ZOCOR) 10 MG tablet Take 1 tablet (10 mg total) by mouth daily. 30 tablet 6  . torsemide (DEMADEX) 100 MG tablet Take 100 mg by mouth daily.    Marland Kitchen. warfarin (COUMADIN) 2 MG tablet Take 1/2 to 1 tablet  daily as directed by Coumadin clinic (Patient taking differently: Take 2 mg daily for  5 days  Except  Monday and Thursday  1 mg.) 90 tablet 1   No current facility-administered medications for this encounter.     BP 128/76   Pulse 86   Wt 154 lb 12.8 oz (70.2 kg)   SpO2 96%   BMI 30.23 kg/m  General: NAD Neck: JVP 8 cm, no thyromegaly or thyroid nodule.  Lungs: Clear to auscultation bilaterally with normal respiratory effort. CV: Nondisplaced PMI.  Heart irregular, mechanical S1, no S3/S4, 1/6 SEM RUSB.  No peripheral edema.  No carotid bruit.  Normal pedal pulses.  Abdomen: Soft, nontender, no hepatosplenomegaly, no distention.  Skin: Intact without lesions or rashes.  Neurologic: Alert and oriented x 3.  Psych: Normal affect. Extremities: No clubbing or cyanosis.  HEENT: Normal.   Assessment/Plan: 1. Atrial fibrillation: Chronic.  Continue warfarin.  Continue Toprol XL 50 mg daily.     2. Mechanical mitral valve: Continue ASA 81 daily and coumadin with INR goal 2.5-3.5.  Will need bridging with heparin/Lovenox if has to come off coumadin.  Needs endocarditis prophylaxis with any dental work. Mechanical MV looked ok on echo in 4/18.  - CBC today.  3. Diastolic CHF:  Suspect slight volume overload but symptomatically stable.  Creatinine up to 1.5 last check.   - Would continue current torsemide 100 mg daily.  - BMET today.  4. Hyperlipidemia: Check lipids today.  Followup in 3 months.   Marca Ancona 08/11/2017

## 2017-08-14 ENCOUNTER — Other Ambulatory Visit (HOSPITAL_COMMUNITY): Payer: Self-pay | Admitting: Internal Medicine

## 2017-08-14 ENCOUNTER — Encounter (HOSPITAL_COMMUNITY): Payer: Self-pay

## 2017-09-06 ENCOUNTER — Ambulatory Visit (INDEPENDENT_AMBULATORY_CARE_PROVIDER_SITE_OTHER): Payer: Medicaid Other | Admitting: *Deleted

## 2017-09-06 DIAGNOSIS — Z5181 Encounter for therapeutic drug level monitoring: Secondary | ICD-10-CM | POA: Diagnosis not present

## 2017-09-06 DIAGNOSIS — I059 Rheumatic mitral valve disease, unspecified: Secondary | ICD-10-CM | POA: Diagnosis not present

## 2017-09-06 DIAGNOSIS — Z7901 Long term (current) use of anticoagulants: Secondary | ICD-10-CM

## 2017-09-06 LAB — POCT INR: INR: 2.8

## 2017-09-06 NOTE — Patient Instructions (Signed)
Description   Continue same dose of  Coumadin 1 tablet (2mg) daily except 1/2 tablet (1mg) on Mondays and Thursdays.  Recheck in 4 weeks.      

## 2017-09-10 ENCOUNTER — Ambulatory Visit: Payer: Medicaid Other | Admitting: Student in an Organized Health Care Education/Training Program

## 2017-09-10 ENCOUNTER — Other Ambulatory Visit (HOSPITAL_COMMUNITY): Payer: Self-pay | Admitting: Cardiology

## 2017-09-10 ENCOUNTER — Encounter: Payer: Self-pay | Admitting: Student in an Organized Health Care Education/Training Program

## 2017-09-10 ENCOUNTER — Other Ambulatory Visit: Payer: Self-pay

## 2017-09-10 ENCOUNTER — Ambulatory Visit (HOSPITAL_COMMUNITY): Payer: Medicaid Other

## 2017-09-10 ENCOUNTER — Ambulatory Visit (HOSPITAL_COMMUNITY)
Admission: RE | Admit: 2017-09-10 | Discharge: 2017-09-10 | Disposition: A | Discharge status: Home / Self Care | Payer: Medicaid Other | Source: Ambulatory Visit | Attending: Family Medicine | Admitting: Family Medicine

## 2017-09-10 ENCOUNTER — Ambulatory Visit (HOSPITAL_COMMUNITY)
Admission: RE | Admit: 2017-09-10 | Discharge: 2017-09-10 | Disposition: A | Discharge status: Home / Self Care | Payer: Medicaid Other | Source: Ambulatory Visit | Attending: Hematology | Admitting: Hematology

## 2017-09-10 VITALS — BP 118/62 | HR 61 | Temp 97.9°F | Ht 60.0 in | Wt 157.2 lb

## 2017-09-10 DIAGNOSIS — M25572 Pain in left ankle and joints of left foot: Secondary | ICD-10-CM | POA: Diagnosis not present

## 2017-09-10 DIAGNOSIS — M899 Disorder of bone, unspecified: Secondary | ICD-10-CM | POA: Insufficient documentation

## 2017-09-10 DIAGNOSIS — M7989 Other specified soft tissue disorders: Secondary | ICD-10-CM | POA: Insufficient documentation

## 2017-09-10 DIAGNOSIS — M25571 Pain in right ankle and joints of right foot: Secondary | ICD-10-CM | POA: Diagnosis not present

## 2017-09-10 DIAGNOSIS — M25579 Pain in unspecified ankle and joints of unspecified foot: Secondary | ICD-10-CM | POA: Diagnosis not present

## 2017-09-10 NOTE — Patient Instructions (Signed)
It was a pleasure seeing you today in our clinic. Today we discussed your ankle pain. Here is the treatment plan we have discussed and agreed upon together:  Please go to: To get an x-ray of your ankle  Please use an over-the-counter steroid lotion for the rash on your side, please follow-up if it worsens or fails to improve  You can use ibuprofen for your pain  We drew blood work at today's visit. I will call or send you a letter with these results. If you do not hear from me within the next week, please give our office a call.  Our clinic's number is [phone]. Please call with questions or concerns about what we discussed today.  Be well, Dr. Mosetta PuttFeng

## 2017-09-11 LAB — URIC ACID: URIC ACID: 6.6 mg/dL (ref 2.5–7.1)

## 2017-09-12 ENCOUNTER — Telehealth: Payer: Self-pay | Admitting: *Deleted

## 2017-09-12 ENCOUNTER — Other Ambulatory Visit (HOSPITAL_COMMUNITY): Payer: Self-pay | Admitting: *Deleted

## 2017-09-12 MED ORDER — FOLIC ACID 1 MG PO TABS: 1.0000 mg | ORAL_TABLET | Freq: Every day | ORAL | 3 refills | Status: DC

## 2017-09-12 NOTE — Telephone Encounter (Signed)
Daughter called in asking for results of ankle x-ray. Explained there was no fx but there is soft tissue swelling. Encouraged RICE. Daughter appreciative. Please call daughter at [phone] for any additional recommendations if needed. Kinnie FeilL. Dhaval Woo, RN, BSN

## 2017-09-16 ENCOUNTER — Encounter: Payer: Self-pay | Admitting: Student in an Organized Health Care Education/Training Program

## 2017-09-16 DIAGNOSIS — M25579 Pain in unspecified ankle and joints of unspecified foot: Secondary | ICD-10-CM | POA: Insufficient documentation

## 2017-09-16 NOTE — Progress Notes (Signed)
   CC: Bilateral ankle pain  HPI: Deborah [name] is a 67 y.o. female who presents with bilateral ankle pain.  Ankle pain has persisted for 2 weeks but worsened over last few days. No trauma to the ankle. She has not appreciated redness or swelling. She took ciprofloxacin recently and her daughter in the room asks about potential for tendon rupture. She has a history of gout and has been taking allopurinol as prescribed. She has had no difficulty walking or bearing weight on the ankles. No previous episode in the past. No other joint pain or swelling. No fevers, weakness, leg pain, or calf pain.  Review of Symptoms:  See HPI for ROS.   CC, SH/smoking status, and VS noted.  Objective: BP 118/62   Pulse 61   Temp 97.9 F (36.6 C) (Oral)   Ht 5' (1.524 m)   Wt 157 lb 3.2 oz (71.3 kg)   SpO2 99%   BMI 30.70 kg/m  GEN: NAD, alert, cooperative, and pleasant. ANKLE: Full ROM bilaterally, no ttp around entire joint or foot. No redness, warmth or edema appreciated bilaterally. No ligament laxity or evidence of deformity bilaterally.Distal sensation intact. Distal pulses intact. No calf tenderness or achilles tendon laxity or tenderness. PSYCH: AAOx3, appropriate affect  Assessment and plan:  Pain in joint involving ankle and foot Bilateral. No evidence of joint infection. Considered osteoarthritis, though it would be unusual to have onset of bilateral joint pain with arthritis. Patient has a history of gout, and we can check a uric acid. Pseudogout is also a consideration. - check uric acid - patient identifies right ankle as the "more painful" one so we will will XR this to look for evidence of osteoarthritis - continue OTC pain control - if pain continues and test results are negative, can consider sending to sports medicine for possible joint tap for pseudogout   Orders Placed This Encounter  Procedures  . DG Ankle Complete Right    Standing Status:   Future    Number of  Occurrences:   1    Standing Expiration Date:   11/09/2018    Order Specific Question:   Reason for Exam (SYMPTOM  OR DIAGNOSIS REQUIRED)    Answer:   ankle pain    Order Specific Question:   Preferred imaging location?    Answer[facility]   [facility]    Order Specific Question:   Radiology Contrast Protocol - do NOT remove file path    Answer:   [email or web address]  . Uric Acid   [name], MD,MS,  PGY2 09/16/2017 2:26 AM

## 2017-09-16 NOTE — Telephone Encounter (Signed)
Pt is not doing any better. Would like a referral to orthopedic.  [facility]. Please advise She would also like the x-ray results mailed to her

## 2017-09-16 NOTE — Assessment & Plan Note (Signed)
Bilateral. No evidence of joint infection. Considered osteoarthritis, though it would be unusual to have onset of bilateral joint pain with arthritis. Patient has a history of gout, and we can check a uric acid. Pseudogout is also a consideration. - check uric acid - patient identifies right ankle as the "more painful" one so we will will XR this to look for evidence of osteoarthritis - continue OTC pain control - if pain continues and test results are negative, can consider sending to sports medicine for possible joint tap for pseudogout

## 2017-09-17 ENCOUNTER — Encounter: Payer: Self-pay | Admitting: Student in an Organized Health Care Education/Training Program

## 2017-09-17 NOTE — Telephone Encounter (Signed)
Routing this to the provider who saw her for this issue.

## 2017-09-19 ENCOUNTER — Encounter: Payer: Self-pay | Admitting: Student in an Organized Health Care Education/Training Program

## 2017-09-19 ENCOUNTER — Other Ambulatory Visit: Payer: Self-pay | Admitting: Student in an Organized Health Care Education/Training Program

## 2017-09-19 DIAGNOSIS — M25579 Pain in unspecified ankle and joints of unspecified foot: Secondary | ICD-10-CM

## 2017-09-19 NOTE — Telephone Encounter (Signed)
I placed a referral to orthopedics for the patient.

## 2017-09-26 ENCOUNTER — Other Ambulatory Visit: Payer: Self-pay | Admitting: Family Medicine

## 2017-09-30 ENCOUNTER — Ambulatory Visit (INDEPENDENT_AMBULATORY_CARE_PROVIDER_SITE_OTHER): Payer: Medicare Other

## 2017-09-30 ENCOUNTER — Ambulatory Visit (INDEPENDENT_AMBULATORY_CARE_PROVIDER_SITE_OTHER): Payer: Medicare Other | Admitting: Orthopaedic Surgery

## 2017-09-30 ENCOUNTER — Encounter (INDEPENDENT_AMBULATORY_CARE_PROVIDER_SITE_OTHER): Payer: Self-pay | Admitting: Orthopaedic Surgery

## 2017-09-30 DIAGNOSIS — G8929 Other chronic pain: Secondary | ICD-10-CM

## 2017-09-30 DIAGNOSIS — M25571 Pain in right ankle and joints of right foot: Secondary | ICD-10-CM

## 2017-09-30 DIAGNOSIS — M25572 Pain in left ankle and joints of left foot: Secondary | ICD-10-CM | POA: Diagnosis not present

## 2017-09-30 NOTE — Progress Notes (Signed)
Office Visit Note   Patient: Deborah [name]           Date of Birth: 03/21/1951           MRN: [id number] Visit Date: 09/30/2017              Requested by: Howard PouchFeng, Lauren, MD [address] [place], [place] [id number] PCP: Freddrick MarchAmin, Yashika, MD   Assessment & Plan: Visit Diagnoses:  1. Chronic pain of both ankles     Plan: Patient declined cortisone injection and physical therapy.  Patient uses Voltaren gel and tramadol as needed.  Questions encouraged and answered.  Patient cannot take NSAIDs due to being on chronic warfarin.  Follow-Up Instructions: Return if symptoms worsen or fail to improve.   Orders:  Orders Placed This Encounter  Procedures  . XR Ankle Complete Left   No orders of the defined types were placed in this encounter.     Procedures: No procedures performed   Clinical Data: No additional findings.   Subjective: Chief Complaint  Patient presents with  . Left Ankle - Pain  . Right Ankle - Pain    Patient is a 67 year old female who comes in with bilateral ankle pain is mainly on the anterolateral aspect.  This is worse with ambulation and with activity.  She takes warfarin for atrial fibrillation and heart valve replacement.  She denies any previous injuries.  She denies any numbness and tingling.  Denies any real swelling.    Review of Systems  Constitutional: Negative.   HENT: Negative.   Eyes: Negative.   Respiratory: Negative.   Cardiovascular: Negative.   Endocrine: Negative.   Musculoskeletal: Negative.   Neurological: Negative.   Hematological: Negative.   Psychiatric/Behavioral: Negative.   All other systems reviewed and are negative.    Objective: Vital Signs: There were no vitals taken for this visit.  Physical Exam  Constitutional: She is oriented to person, place, and time. She appears well-developed and well-nourished.  HENT:  Head: Normocephalic and atraumatic.  Eyes: EOM are normal.  Neck: Neck supple.  Pulmonary/Chest:  Effort normal.  Abdominal: Soft.  Neurological: She is alert and oriented to person, place, and time.  Skin: Skin is warm. Capillary refill takes less than 2 seconds.  Psychiatric: She has a normal mood and affect. Her behavior is normal. Judgment and thought content normal.  Nursing note and vitals reviewed.   Ortho Exam Bilateral ankle exam shows that she has some mild swelling over the ATFL ligament region.  Negative drawer test.  Subtalar motion is normal.  Peroneal tendons are  asymptomatic.  Asymptomatic.  Impression is 67 year old female with bilateral ankle osteoarthritis.  Recommend ASO brace for support. Specialty Comments:  No specialty comments available.  Imaging: Xr Ankle Complete Left  Result Date: 09/30/2017 Mild osteoarthritis with spurring    PMFS History: Patient Active Problem List   Diagnosis Date Noted  . Chronic pain of both ankles 09/30/2017  . Pain in joint involving ankle and foot 09/16/2017  . NAFLD (nonalcoholic fatty liver disease) 19/14/782910/03/2017  . Knee pain, bilateral 04/12/2017  . Low back pain without sciatica 06/12/2016  . Nonspecific chest pain   . Dehydration 04/12/2016  . Vision changes 02/21/2016  . RUQ pain 03/24/2015  . 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 Chest pain 02/24/2014  . Encounter for therapeutic drug monitoring 09/30/2013  . 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 H/O prosthetic heart valve 07/08/2013  . Eosinophilia 01/27/2013  .  Hypotension 12/12/2012  . Microcytic anemia 05/01/2012  . 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 Postprocedural state 05/22/2011  . Diastolic CHF, chronic (HCC) 03/15/2011  . Rheumatic heart disease   . Chronic atrial fibrillation (HCC)   . Chronic anticoagulation   . DM (diabetes mellitus) (HCC)   . Hyperlipidemia   . Mitral valve disorder 11/16/2010  . GOUT 11/29/2009  . TRICUSPID REGURGITATION, MODERATE WITH MILD PULMONARY HTN 11/29/2009  . HYPERBILIRUBINEMIA  06/22/2009  . HYPERGLYCEMIA 06/21/2009  . Hyperlipidemia associated with type 2 diabetes mellitus (HCC) 01/07/2009  . GERD 01/07/2009  . CARPAL TUNNEL SYNDROME, LEFT 11/11/2008   Past Medical History:  Diagnosis Date  . Allergic rhinitis   . Anemia   . Borderline diabetes   . Carpal tunnel syndrome   . Chronic atrial fibrillation (HCC)    coumad[facility] by [facility]. CVRR;  Event montor (7/12-8/12) showed no high rate episodes (patient continuously in atrial fibrillation).   . Chronic diastolic heart failure (HCC)   . Eosinophilia 01/27/2013  . External hemorrhoid   . External hemorrhoids   . Fatigue   . Gout   . 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 Gaylyn Rong  . Hx of cardiovascular stress test    ETT-myoview (7/12): No evidence for ischemia or infarction  . Hyperlipidemia   . Hypertension   . Obesity   . Rheumatic heart disease    Status post mechanical ([employer]) mitral valve replacement and tricuspid valve repair at [facility] in 2002; echo 5/12:   EF 60-65%, mild AI, mitral valve prosthesis with AVA 1.66, severe LAE, moderate RAE, moderate to severe TR, mild increased pulmonary artery systolic pressure;    Adenosine Cardiolite in 4/09:   No ischemia, EF 66%  . Rotator cuff syndrome     Family History  Problem Relation Age of Onset  . Stroke Mother   . Diabetes Mother   . Heart failure Mother        pacemaker  . Heart disease Father        "heart problems"  . Heart disease Sister   . Heart disease Brother   . Gout Brother   . Diabetes Brother   . Stroke Brother   . Valvular heart disease Sister     Past Surgical History:  Procedure Laterality Date  . CARDIAC CATHETERIZATION     EF 55%  . TRICUSPID VALVE REPAIR     Social History   Occupational History  . Not on file  Tobacco Use  . Smoking status: Never Smoker  . Smokeless tobacco: Never Used  Substance and Sexual Activity  . Alcohol use: No  . Drug use: No    . Sexual activity: Not on file

## 2017-10-02 ENCOUNTER — Other Ambulatory Visit (HOSPITAL_COMMUNITY): Payer: Self-pay | Admitting: Cardiology

## 2017-10-04 ENCOUNTER — Ambulatory Visit (INDEPENDENT_AMBULATORY_CARE_PROVIDER_SITE_OTHER): Payer: Medicare Other | Admitting: *Deleted

## 2017-10-04 DIAGNOSIS — Z5181 Encounter for therapeutic drug level monitoring: Secondary | ICD-10-CM

## 2017-10-04 DIAGNOSIS — I081 Rheumatic disorders of both mitral and tricuspid valves: Secondary | ICD-10-CM | POA: Diagnosis not present

## 2017-10-04 DIAGNOSIS — I482 Chronic atrial fibrillation, unspecified: Secondary | ICD-10-CM

## 2017-10-04 DIAGNOSIS — Z952 Presence of prosthetic heart valve: Secondary | ICD-10-CM

## 2017-10-04 DIAGNOSIS — I059 Rheumatic mitral valve disease, unspecified: Secondary | ICD-10-CM

## 2017-10-04 DIAGNOSIS — Z7901 Long term (current) use of anticoagulants: Secondary | ICD-10-CM | POA: Diagnosis not present

## 2017-10-04 LAB — POCT INR: INR: 2.9

## 2017-10-04 NOTE — Patient Instructions (Signed)
Description   Continue same dose of  Coumadin 1 tablet (2mg ) daily except 1/2 tablet (1mg ) on Mondays and Thursdays.  Recheck in 6 weeks.

## 2017-10-07 ENCOUNTER — Other Ambulatory Visit: Payer: Self-pay | Admitting: Internal Medicine

## 2017-10-18 ENCOUNTER — Other Ambulatory Visit: Payer: Self-pay | Admitting: Family Medicine

## 2017-10-18 NOTE — Telephone Encounter (Signed)
Will forward to PCP  Bacigalupo, Marzella SchleinAngela M, MD, MPH [facility] 10/18/2017 12:30 PM

## 2017-10-31 ENCOUNTER — Other Ambulatory Visit: Payer: Self-pay | Admitting: Internal Medicine

## 2017-11-06 ENCOUNTER — Other Ambulatory Visit: Payer: Self-pay | Admitting: Family Medicine

## 2017-11-12 ENCOUNTER — Other Ambulatory Visit: Payer: Self-pay

## 2017-11-12 ENCOUNTER — Encounter (HOSPITAL_COMMUNITY): Payer: Self-pay | Admitting: Cardiology

## 2017-11-12 ENCOUNTER — Ambulatory Visit (HOSPITAL_COMMUNITY)
Admission: RE | Admit: 2017-11-12 | Discharge: 2017-11-12 | Disposition: A | Discharge status: Home / Self Care | Payer: Medicare Other | Source: Ambulatory Visit | Attending: Cardiology | Admitting: Cardiology

## 2017-11-12 VITALS — BP 123/77 | HR 77 | Wt 156.0 lb

## 2017-11-12 DIAGNOSIS — I130 Hypertensive heart and chronic kidney disease with heart failure and stage 1 through stage 4 chronic kidney disease, or unspecified chronic kidney disease: Secondary | ICD-10-CM | POA: Diagnosis not present

## 2017-11-12 DIAGNOSIS — E785 Hyperlipidemia, unspecified: Secondary | ICD-10-CM | POA: Diagnosis not present

## 2017-11-12 DIAGNOSIS — I5032 Chronic diastolic (congestive) heart failure: Secondary | ICD-10-CM | POA: Diagnosis not present

## 2017-11-12 DIAGNOSIS — R42 Dizziness and giddiness: Secondary | ICD-10-CM | POA: Diagnosis not present

## 2017-11-12 DIAGNOSIS — I482 Chronic atrial fibrillation, unspecified: Secondary | ICD-10-CM

## 2017-11-12 DIAGNOSIS — D560 Alpha thalassemia: Secondary | ICD-10-CM | POA: Diagnosis not present

## 2017-11-12 DIAGNOSIS — Z79899 Other long term (current) drug therapy: Secondary | ICD-10-CM | POA: Insufficient documentation

## 2017-11-12 DIAGNOSIS — M109 Gout, unspecified: Secondary | ICD-10-CM | POA: Diagnosis not present

## 2017-11-12 DIAGNOSIS — Z952 Presence of prosthetic heart valve: Secondary | ICD-10-CM | POA: Diagnosis not present

## 2017-11-12 DIAGNOSIS — I059 Rheumatic mitral valve disease, unspecified: Secondary | ICD-10-CM | POA: Diagnosis not present

## 2017-11-12 DIAGNOSIS — Z7982 Long term (current) use of aspirin: Secondary | ICD-10-CM | POA: Diagnosis not present

## 2017-11-12 DIAGNOSIS — Z7984 Long term (current) use of oral hypoglycemic drugs: Secondary | ICD-10-CM | POA: Diagnosis not present

## 2017-11-12 DIAGNOSIS — K760 Fatty (change of) liver, not elsewhere classified: Secondary | ICD-10-CM | POA: Insufficient documentation

## 2017-11-12 DIAGNOSIS — Z7901 Long term (current) use of anticoagulants: Secondary | ICD-10-CM | POA: Diagnosis not present

## 2017-11-12 DIAGNOSIS — N189 Chronic kidney disease, unspecified: Secondary | ICD-10-CM | POA: Insufficient documentation

## 2017-11-12 LAB — BASIC METABOLIC PANEL
ANION GAP: 10 (ref 5–15)
BUN: 21 mg/dL — ABNORMAL HIGH (ref 6–20)
CO2: 25 mmol/L (ref 22–32)
Calcium: 9.2 mg/dL (ref 8.9–10.3)
Chloride: 105 mmol/L (ref 101–111)
Creatinine, Ser: 1.26 mg/dL — ABNORMAL HIGH (ref 0.44–1.00)
GFR, EST AFRICAN AMERICAN: 51 mL/min — AB (ref >60)
GFR, EST NON AFRICAN AMERICAN: 44 mL/min — AB (ref >60)
GLUCOSE: 125 mg/dL — AB (ref 65–99)
Potassium: 3.9 mmol/L (ref 3.5–5.1)
SODIUM: 140 mmol/L (ref 135–145)

## 2017-11-12 LAB — CBC
HCT: 35.3 % — ABNORMAL LOW (ref 36.0–46.0)
HEMOGLOBIN: 11.1 g/dL — AB (ref 12.0–15.0)
MCH: 23.3 pg — ABNORMAL LOW (ref 26.0–34.0)
MCHC: 31.4 g/dL (ref 30.0–36.0)
MCV: 74 fL — ABNORMAL LOW (ref 78.0–100.0)
Platelets: 170 10*3/uL (ref 150–400)
RBC: 4.77 MIL/uL (ref 3.87–5.11)
RDW: 16.3 % — ABNORMAL HIGH (ref 11.5–15.5)
WBC: 6.3 10*3/uL (ref 4.0–10.5)

## 2017-11-12 MED ORDER — TORSEMIDE 20 MG PO TABS: ORAL_TABLET | ORAL | 3 refills | Status: DC

## 2017-11-12 MED ORDER — ISOSORBIDE MONONITRATE ER 30 MG PO TB24: 15.0000 mg | ORAL_TABLET | Freq: Every day | ORAL | Status: DC

## 2017-11-12 NOTE — Progress Notes (Signed)
Patient ID: Deborah [name], female   DOB: [birth year], 67 y.o.   MRN: [id number] PCP: Dr. Beryle Flock Cardiology: Dr. [name]  67 y.o. with rheumatic heart disease and MV replacement/TV repair, chronic atrial fibrillation, and chronic diastolic CHF.  Echo in 4/18 showed EF 60-65% with normally functioning mechanical mitral valve.   She returns for followup of CHF. Weight is up 2 lbs.  She has occasional lightheadedness with standing, no falls.  She is short of breath with stairs/hills.  No problems walking on flat ground.  Walks in the pool at the [facility] for exercise.  No chest pain.   ECG (personally reviewed): atrial fibrillation, nonspecific T wave changes.   Labs (9/17): LDL 87, HDL 61 Labs (12/17): HCT 36.7 Labs (1/18): K 3.7, creatinine 1.09, TSH normal, hgb 11.7 Labs (4/18): K 4.1, creatinine 1.37, hgb 11.9 Labs (11/18): K 4.3, creatinine 1.5, tbili 1.2, ALT/AST normal Labs (12/18): LDL 91, HDL 55, hgb 10.9, K 3.7, creatinine 1.22  PMH: 1. Rheumatic heart disease: Status post mechanical MV replacement and tricuspid repair at [facility] in 2002.  Echo 5/12 with EF 60-65%, mild AI, mechanical MV prosthesis with normal function, severe LAE, moderate RAE, moderate to severe TR, mildly elevated PA systolic pressure.  Echo (6/15) EF 55-60%, normal RV, normal mechanical mitral valve, severe LAE, moderate TR.  Echo (9/16) with EF 55-60%, mild to moderate AI, mechanical mitral valve looks ok, s/p TV repair with mild TR, PASP 40.  - Echo (4/18): EF 60-65%, mechanical mitral valve functioned normally, mild AI, repaired tricuspid valve with mild TR, no TS; PASP 33 mmHg.  2. Chronic atrial fibrillation on coumadin.  Event monitor (7/12-8/12) showed no high rate episodes (patient continuously in atrial fibrillation).  3. Chronic diastolic CHF 4. ETT-myoview (7/12): No evidence for ischemia or infarction.  5. Impaired fasting glucose 6. Hyperlipidemia  7. HTN 8. Gout 9. Anemia 10. External hemorrhoids.   11. 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  13. CKD 14. Fatty liver  SH: Nonsmoker. Lives in [place].   FH: No premature CAD  ROS: All systems reviewed and negative except as noted in HPI.    Current Outpatient Medications  Medication Sig Dispense Refill  . ACCU-CHEK AVIVA PLUS test strip USE AS INSTRUCTED TO TEST BLOOD GLUCOSE TWICE A DAY 100 each 3  . ACCU-CHEK SOFTCLIX LANCETS lancets USE AS DIRECTED TO test blood sugar 100 each 6  . acetaminophen (TYLENOL) 500 MG tablet Take 500-1,000 mg by mouth every 6 (six) hours as needed for fever (pain).    Marland Kitchen allopurinol (ZYLOPRIM) 300 MG tablet TAKE one tablet BY MOUTH EVERY DAY 30 tablet 11  . aspirin 81 MG chewable tablet Chew 81 mg by mouth daily.    . cyanocobalamin 500 MCG tablet Take 1 tablet (500 mcg total) by mouth daily. 30 tablet 5  . cycloSPORINE (RESTASIS) 0.05 % ophthalmic emulsion Place 1 drop into both eyes 2 (two) times daily.    Marland Kitchen dextromethorphan-guaiFENesin (TUSSIN DM) 10-100 MG/5ML liquid Take 5 mLs every 4 (four) hours as needed by mouth for cough. 237 mL 0  . diazepam (VALIUM) 5 MG tablet TAKE 1 TABLET BY MOUTH EVERY 8 HOURS AS NEEDED FOR ANXIETY    . diclofenac sodium (VOLTAREN) 1 % GEL Apply 2 g topically 4 (four) times daily. 100 g 3  . folic acid (FOLVITE) 1 MG tablet Take 1 tablet (1 mg total) by mouth daily. 30 tablet 3  .  isosorbide mononitrate (IMDUR) 30 MG 24 hr tablet Take 0.5 tablets (15 mg total) by mouth daily.    Marland Kitchen. loratadine (CLARITIN) 10 MG tablet Take 10 mg by mouth daily as needed for allergies.     Marland Kitchen. meclizine (ANTIVERT) 25 MG tablet Take 1 tablet (25 mg total) by mouth 3 (three) times daily as needed for dizziness. CONTACT PCP FOR FURTHER REFILLS 30 tablet 3  . Menthol-Methyl Salicylate (TIGER BALM LINIMENT EX) Apply 1 application topically daily.    . metFORMIN (GLUCOPHAGE) 500 MG tablet TAKE TWO TABLETS BY MOUTH TWICE DAILY WITH A  MEAL 120 tablet 0  . metoprolol succinate (TOPROL-XL) 25 MG 24 hr tablet Take 50 mg by mouth daily.    . nitroGLYCERIN (NITROSTAT) 0.4 MG SL tablet Place 0.4 mg under the tongue every 5 (five) minutes as needed for chest pain.    Marland Kitchen. Olopatadine HCl (PAZEO) 0.7 % SOLN Place 1 drop into both eyes daily as needed (itching/ irritation).    Marland Kitchen. omeprazole (PRILOSEC) 20 MG capsule TAKE ONE CAPSULE BY MOUTH TWICE DAILY BEFORE meals 60 capsule 2  . potassium chloride SA (K-DUR,KLOR-CON) 20 MEQ tablet Take 2 tablets (40 mEq total) by mouth 2 (two) times daily. 120 tablet 6  . simvastatin (ZOCOR) 10 MG tablet Take 1 tablet (10 mg total) by mouth daily. 30 tablet 6  . torsemide (DEMADEX) 20 MG tablet Take 4 tabs in AM and 2 tabs in PM 180 tablet 3  . warfarin (COUMADIN) 2 MG tablet TAKE ONE-HALF TO 1 TABLET BY MOUTH DAILY AS DIRECTED by THE coumadin clinic 90 tablet 1   No current facility-administered medications for this encounter.     BP 123/77   Pulse 77   Wt 156 lb (70.8 kg)   SpO2 97%   BMI 30.47 kg/m  General: NAD Neck: JVP 9-10 cm, no thyromegaly or thyroid nodule.  Lungs: Clear to auscultation bilaterally with normal respiratory effort. CV: Nondisplaced PMI.  Heart irregular S1/S2 with mechanical S1, no S3/S4, no murmur.  No peripheral edema.    Abdomen: Soft, nontender, no hepatosplenomegaly, no distention.  Skin: Intact without lesions or rashes.  Neurologic: Alert and oriented x 3.  Psych: Normal affect. Extremities: No clubbing or cyanosis.  HEENT: Normal.   Assessment/Plan: 1. Atrial fibrillation: Chronic.  Continue warfarin.  Continue Toprol XL 50 mg daily.     2. Mechanical mitral valve: Continue ASA 81 daily and coumadin with INR goal 2.5-3.5.  Will need bridging with heparin/Lovenox if has to come off coumadin.  Needs endocarditis prophylaxis with any dental work. Mechanical MV looked ok on echo in 4/18.  - CBC today.  3. Diastolic CHF:  On exam, she appears volume overloaded  and weight is up a couple lbs.  NYHA class II symptoms.  - Change torsemide dosing to 80 qam/40 qpm. BMET today and in 10 days.  4. Lightheadedness: Decrease Imdur to 15 mg daily.  If no worsening of dyspnea or development of chest pain, can then stop Imdur.  Followup in 3 months.   Marca AnconaDalton Orman Matsumura 11/12/2017

## 2017-11-12 NOTE — Patient Instructions (Signed)
Decrease Imdur to 15 mg (1/2 tab) daily  Change Torsemide to 80 mg (4 tabs) in AM and 40 mg (2 tabs) in PM  Labs today  Labs in 10 days  Your physician recommends that you schedule a follow-up appointment in: 3 months

## 2017-11-15 ENCOUNTER — Ambulatory Visit (INDEPENDENT_AMBULATORY_CARE_PROVIDER_SITE_OTHER): Payer: Medicare Other | Admitting: *Deleted

## 2017-11-15 DIAGNOSIS — Z7901 Long term (current) use of anticoagulants: Secondary | ICD-10-CM | POA: Diagnosis not present

## 2017-11-15 DIAGNOSIS — Z5181 Encounter for therapeutic drug level monitoring: Secondary | ICD-10-CM | POA: Diagnosis not present

## 2017-11-15 DIAGNOSIS — I059 Rheumatic mitral valve disease, unspecified: Secondary | ICD-10-CM | POA: Diagnosis not present

## 2017-11-15 LAB — POCT INR: INR: 4.4

## 2017-11-15 NOTE — Patient Instructions (Signed)
Description   Do not take any Coumadin today then continue same dose of  Coumadin 1 tablet (2mg ) daily except 1/2 tablet (1mg ) on Mondays and Thursdays.  Recheck in 3 weeks.

## 2017-11-22 ENCOUNTER — Other Ambulatory Visit: Payer: Self-pay | Admitting: Family Medicine

## 2017-11-22 ENCOUNTER — Ambulatory Visit (HOSPITAL_COMMUNITY)
Admission: RE | Admit: 2017-11-22 | Discharge: 2017-11-22 | Disposition: A | Discharge status: Home / Self Care | Payer: Medicare Other | Source: Ambulatory Visit | Attending: Cardiology | Admitting: Cardiology

## 2017-11-22 DIAGNOSIS — I5032 Chronic diastolic (congestive) heart failure: Secondary | ICD-10-CM | POA: Insufficient documentation

## 2017-11-22 LAB — BASIC METABOLIC PANEL
Anion gap: 10 (ref 5–15)
BUN: 22 mg/dL — AB (ref 6–20)
CO2: 26 mmol/L (ref 22–32)
Calcium: 9.9 mg/dL (ref 8.9–10.3)
Chloride: 103 mmol/L (ref 101–111)
Creatinine, Ser: 1.35 mg/dL — ABNORMAL HIGH (ref 0.44–1.00)
GFR calc Af Amer: 47 mL/min — ABNORMAL LOW (ref >60)
GFR, EST NON AFRICAN AMERICAN: 40 mL/min — AB (ref >60)
Glucose, Bld: 129 mg/dL — ABNORMAL HIGH (ref 65–99)
Potassium: 3.9 mmol/L (ref 3.5–5.1)
SODIUM: 139 mmol/L (ref 135–145)

## 2017-11-26 ENCOUNTER — Other Ambulatory Visit: Payer: Self-pay | Admitting: Internal Medicine

## 2017-11-29 ENCOUNTER — Other Ambulatory Visit (HOSPITAL_COMMUNITY): Payer: Self-pay | Admitting: Internal Medicine

## 2017-12-06 ENCOUNTER — Other Ambulatory Visit (HOSPITAL_COMMUNITY): Payer: Self-pay | Admitting: Internal Medicine

## 2017-12-09 ENCOUNTER — Ambulatory Visit (INDEPENDENT_AMBULATORY_CARE_PROVIDER_SITE_OTHER): Payer: Medicare Other | Admitting: *Deleted

## 2017-12-09 DIAGNOSIS — I059 Rheumatic mitral valve disease, unspecified: Secondary | ICD-10-CM | POA: Diagnosis not present

## 2017-12-09 DIAGNOSIS — Z5181 Encounter for therapeutic drug level monitoring: Secondary | ICD-10-CM | POA: Diagnosis not present

## 2017-12-09 DIAGNOSIS — Z7901 Long term (current) use of anticoagulants: Secondary | ICD-10-CM | POA: Diagnosis not present

## 2017-12-09 LAB — POCT INR: INR: 3

## 2017-12-09 NOTE — Patient Instructions (Signed)
Description   Continue same dose of  Coumadin 1 tablet (2mg) daily except 1/2 tablet (1mg) on Mondays and Thursdays.  Recheck in 4 weeks.      

## 2017-12-31 ENCOUNTER — Other Ambulatory Visit: Payer: Self-pay | Admitting: Internal Medicine

## 2018-01-02 ENCOUNTER — Ambulatory Visit (INDEPENDENT_AMBULATORY_CARE_PROVIDER_SITE_OTHER): Payer: Medicare Other | Admitting: *Deleted

## 2018-01-02 DIAGNOSIS — Z5181 Encounter for therapeutic drug level monitoring: Secondary | ICD-10-CM | POA: Diagnosis not present

## 2018-01-02 DIAGNOSIS — I059 Rheumatic mitral valve disease, unspecified: Secondary | ICD-10-CM

## 2018-01-02 DIAGNOSIS — I482 Chronic atrial fibrillation, unspecified: Secondary | ICD-10-CM

## 2018-01-02 DIAGNOSIS — Z7901 Long term (current) use of anticoagulants: Secondary | ICD-10-CM

## 2018-01-02 LAB — POCT INR: INR: 3.1

## 2018-01-02 NOTE — Patient Instructions (Signed)
Description   Continue same dose of  Coumadin 1 tablet (2mg) daily except 1/2 tablet (1mg) on Mondays and Thursdays.  Recheck in 4 weeks.      

## 2018-01-22 ENCOUNTER — Ambulatory Visit (INDEPENDENT_AMBULATORY_CARE_PROVIDER_SITE_OTHER): Payer: Medicare Other | Admitting: Family Medicine

## 2018-01-22 ENCOUNTER — Encounter: Payer: Self-pay | Admitting: Family Medicine

## 2018-01-22 ENCOUNTER — Other Ambulatory Visit: Payer: Self-pay

## 2018-01-22 DIAGNOSIS — H9201 Otalgia, right ear: Secondary | ICD-10-CM | POA: Diagnosis present

## 2018-01-22 DIAGNOSIS — H9209 Otalgia, unspecified ear: Secondary | ICD-10-CM | POA: Insufficient documentation

## 2018-01-22 MED ORDER — BENZONATATE 100 MG PO CAPS: 100.0000 mg | ORAL_CAPSULE | Freq: Two times a day (BID) | ORAL | 0 refills | Status: DC | PRN

## 2018-01-22 MED ORDER — DICLOFENAC SODIUM 1 % TD GEL: 2.0000 g | Freq: Four times a day (QID) | TRANSDERMAL | 3 refills | Status: DC

## 2018-01-22 NOTE — Patient Instructions (Addendum)
Good to see you today!  Thanks for coming in.  I think you have a muscle strain on the R side of your neck It should be better in the next few days Use diclofenac gel and tylenol as needed  If you have severe pain or notice any redness or blisters on your ear or pain in the front of your neck or trouble seeing or fever see [place] right away  Use tessalon as needed for couggh

## 2018-01-22 NOTE — Assessment & Plan Note (Addendum)
The most likely diagnosis is muscle strain of neck given tenderness over strap muscle and with ROM and that pinna is not tender     I have considered and concluded the patient has a very low likelihood of having: Thyroid tumor, Acoustic neuroma, Myocardial infarction Temporal arteritis, Cholesteotoma, Tumors (ear, nasopharyngeal), Thoracic aneurysm If persists or worsens may need neck ultrasound

## 2018-01-22 NOTE — Progress Notes (Signed)
Subjective  Deborah [name] is a 67 y.o. female is presenting with the following  EAR PAIN  Location: R ear and posterior neck Ear pain started: 2 days ago was severe then and yesterday but better today.  Pain started as sharp and stabbing and now is achy  Severity: bad yesterday had trouble doing things now better Medications tried: tylenol Recent ear trauma: no Prior ear surgeries: no Antibiotics in the last 30 days: no History of diabetes: yes  Symptoms Ear discharge: no Fever: no Pain with chewing: no Ringing in ears: no Dizziness: no Hearing loss: no Rashes or blisters around ear: no Weight loss: no  No radiation down arm, no visual changes   Review of Symptoms - see HPI PMH - Smoking status noted.  On warfarin for valve, has diabetes     Chief Complaint noted Review of Symptoms - see HPI PMH - Smoking status noted.    Objective Vital Signs reviewed BP 102/62   Pulse 69   Temp 98.2 F (36.8 C) (Oral)   Wt 153 lb (69.4 kg)   SpO2 97%   BMI 29.88 kg/m   Alert no distress  R pinna is normal appearance without rash or redness or blisters and is not tender to palpation Is tender over post strap neck muscles without mass R carotid is not enlarged and not tender without bruit FROM of neck with tenderness looking R Ears:  External ear exam shows no significant lesions or deformities.  Otoscopic examination reveals clear canals, tympanic membranes are intact bilaterally without bulging, retraction, inflammation or discharge. Hearing is grossly normal bilaterall Neck - no thyromegaly or adenopathy Heart RRR with valve sound Lungs:  Normal respiratory effort, chest expands symmetrically. Lungs are clear to auscultation, no crackles or wheezes. Extremities:  No cyanosis, edema, or deformity noted with good range of motion of all major joints.      Assessments/Plans  See after visit summary for details of patient instuctions  Ear pain The most likely diagnosis  is muscle strain of neck given tenderness over strap muscle and with ROM and that pinna is not tender     I have considered and concluded the patient has a very low likelihood of having: Thyroid tumor, Acoustic neuroma, Myocardial infarction Temporal arteritis, Cholesteotoma, Tumors (ear, nasopharyngeal), Thoracic aneurysm If persists or worsens may need neck ultrasound

## 2018-01-24 ENCOUNTER — Telehealth: Payer: Self-pay | Admitting: Family Medicine

## 2018-01-24 NOTE — Telephone Encounter (Signed)
-----   Message from Carney Living, MD sent at 01/23/2018 10:42 AM EDT ----- Regarding: ear pain Check

## 2018-01-24 NOTE — Telephone Encounter (Signed)
Left VM to call us if she is not improving

## 2018-01-30 ENCOUNTER — Ambulatory Visit (INDEPENDENT_AMBULATORY_CARE_PROVIDER_SITE_OTHER): Payer: Medicare Other | Admitting: *Deleted

## 2018-01-30 DIAGNOSIS — I059 Rheumatic mitral valve disease, unspecified: Secondary | ICD-10-CM | POA: Diagnosis not present

## 2018-01-30 DIAGNOSIS — I081 Rheumatic disorders of both mitral and tricuspid valves: Secondary | ICD-10-CM | POA: Diagnosis not present

## 2018-01-30 DIAGNOSIS — I482 Chronic atrial fibrillation, unspecified: Secondary | ICD-10-CM

## 2018-01-30 DIAGNOSIS — Z7901 Long term (current) use of anticoagulants: Secondary | ICD-10-CM

## 2018-01-30 DIAGNOSIS — Z5181 Encounter for therapeutic drug level monitoring: Secondary | ICD-10-CM | POA: Diagnosis not present

## 2018-01-30 LAB — POCT INR: INR: 3.8 — AB (ref 2.0–3.0)

## 2018-01-30 NOTE — Patient Instructions (Signed)
Description   Do not take coumadin today June 6th then continue same dose of  Coumadin 1 tablet (2mg ) daily except 1/2 tablet (1mg ) on Mondays and Thursdays.  Recheck in 2 weeks.

## 2018-02-10 ENCOUNTER — Other Ambulatory Visit: Payer: Self-pay | Admitting: Internal Medicine

## 2018-02-10 ENCOUNTER — Other Ambulatory Visit: Payer: Self-pay | Admitting: Family Medicine

## 2018-02-10 ENCOUNTER — Other Ambulatory Visit (HOSPITAL_COMMUNITY): Payer: Self-pay | Admitting: Cardiology

## 2018-02-14 ENCOUNTER — Ambulatory Visit (INDEPENDENT_AMBULATORY_CARE_PROVIDER_SITE_OTHER): Payer: Medicare Other | Admitting: *Deleted

## 2018-02-14 ENCOUNTER — Ambulatory Visit (HOSPITAL_COMMUNITY)
Admission: RE | Admit: 2018-02-14 | Discharge: 2018-02-14 | Disposition: A | Discharge status: Home / Self Care | Payer: Medicare Other | Source: Ambulatory Visit | Attending: Cardiology | Admitting: Cardiology

## 2018-02-14 VITALS — BP 110/64 | HR 79 | Wt 151.4 lb

## 2018-02-14 DIAGNOSIS — I130 Hypertensive heart and chronic kidney disease with heart failure and stage 1 through stage 4 chronic kidney disease, or unspecified chronic kidney disease: Secondary | ICD-10-CM | POA: Diagnosis not present

## 2018-02-14 DIAGNOSIS — I482 Chronic atrial fibrillation, unspecified: Secondary | ICD-10-CM

## 2018-02-14 DIAGNOSIS — K760 Fatty (change of) liver, not elsewhere classified: Secondary | ICD-10-CM | POA: Insufficient documentation

## 2018-02-14 DIAGNOSIS — Z952 Presence of prosthetic heart valve: Secondary | ICD-10-CM | POA: Insufficient documentation

## 2018-02-14 DIAGNOSIS — K219 Gastro-esophageal reflux disease without esophagitis: Secondary | ICD-10-CM | POA: Insufficient documentation

## 2018-02-14 DIAGNOSIS — M109 Gout, unspecified: Secondary | ICD-10-CM | POA: Insufficient documentation

## 2018-02-14 DIAGNOSIS — Z7982 Long term (current) use of aspirin: Secondary | ICD-10-CM | POA: Diagnosis not present

## 2018-02-14 DIAGNOSIS — N189 Chronic kidney disease, unspecified: Secondary | ICD-10-CM | POA: Diagnosis not present

## 2018-02-14 DIAGNOSIS — Z79899 Other long term (current) drug therapy: Secondary | ICD-10-CM | POA: Insufficient documentation

## 2018-02-14 DIAGNOSIS — D560 Alpha thalassemia: Secondary | ICD-10-CM | POA: Insufficient documentation

## 2018-02-14 DIAGNOSIS — Z7984 Long term (current) use of oral hypoglycemic drugs: Secondary | ICD-10-CM | POA: Diagnosis not present

## 2018-02-14 DIAGNOSIS — I5032 Chronic diastolic (congestive) heart failure: Secondary | ICD-10-CM | POA: Diagnosis not present

## 2018-02-14 DIAGNOSIS — Z7901 Long term (current) use of anticoagulants: Secondary | ICD-10-CM | POA: Insufficient documentation

## 2018-02-14 DIAGNOSIS — Z5181 Encounter for therapeutic drug level monitoring: Secondary | ICD-10-CM

## 2018-02-14 DIAGNOSIS — E785 Hyperlipidemia, unspecified: Secondary | ICD-10-CM | POA: Insufficient documentation

## 2018-02-14 DIAGNOSIS — I059 Rheumatic mitral valve disease, unspecified: Secondary | ICD-10-CM | POA: Diagnosis not present

## 2018-02-14 LAB — CBC
HCT: 35.4 % — ABNORMAL LOW (ref 36.0–46.0)
HEMOGLOBIN: 10.8 g/dL — AB (ref 12.0–15.0)
MCH: 23 pg — ABNORMAL LOW (ref 26.0–34.0)
MCHC: 30.5 g/dL (ref 30.0–36.0)
MCV: 75.5 fL — ABNORMAL LOW (ref 78.0–100.0)
Platelets: 157 10*3/uL (ref 150–400)
RBC: 4.69 MIL/uL (ref 3.87–5.11)
RDW: 15.8 % — ABNORMAL HIGH (ref 11.5–15.5)
WBC: 5.9 10*3/uL (ref 4.0–10.5)

## 2018-02-14 LAB — BASIC METABOLIC PANEL
ANION GAP: 10 (ref 5–15)
BUN: 17 mg/dL (ref 6–20)
CALCIUM: 9.4 mg/dL (ref 8.9–10.3)
CO2: 28 mmol/L (ref 22–32)
Chloride: 106 mmol/L (ref 101–111)
Creatinine, Ser: 1.2 mg/dL — ABNORMAL HIGH (ref 0.44–1.00)
GFR, EST AFRICAN AMERICAN: 54 mL/min — AB (ref >60)
GFR, EST NON AFRICAN AMERICAN: 46 mL/min — AB (ref >60)
Glucose, Bld: 131 mg/dL — ABNORMAL HIGH (ref 65–99)
Potassium: 3.7 mmol/L (ref 3.5–5.1)
SODIUM: 144 mmol/L (ref 135–145)

## 2018-02-14 LAB — POCT INR: INR: 3.1 — AB (ref 2.0–3.0)

## 2018-02-14 MED ORDER — GLUCOSE BLOOD VI STRP: ORAL_STRIP | 3 refills | Status: DC

## 2018-02-14 NOTE — Patient Instructions (Addendum)
Description   Continue same dose of  Coumadin 1 tablet (2mg) daily except 1/2 tablet (1mg) on Mondays and Thursdays.  Recheck in 4 weeks.      

## 2018-02-14 NOTE — Patient Instructions (Signed)
Labs drawn today (if we do not call you, then your lab work was stable)   Your physician has requested that you have an echocardiogram. Echocardiography is a painless test that uses sound waves to create images of your heart. It provides your doctor with information about the size and shape of your heart and how well your heart's chambers and valves are working. This procedure takes approximately one hour. There are no restrictions for this procedure.  Your physician recommends that you schedule a follow-up appointment in: 4 month with Dr. Shirlee LatchMcLean  an a echocardiogram

## 2018-02-15 ENCOUNTER — Other Ambulatory Visit (HOSPITAL_COMMUNITY): Payer: Self-pay | Admitting: Cardiology

## 2018-02-16 NOTE — Progress Notes (Signed)
Patient ID: Deborah [name], female   DOB: 11/16/1950, 67 y.o.   MRN: [id number] PCP: Dr. Beryle FlockBacigalupo Cardiology: Dr. Shirlee LatchMcLean  67 y.o. with rheumatic heart disease and MV replacement/TV repair, chronic atrial fibrillation, and chronic diastolic CHF.  Echo in 4/18 showed EF 60-65% with normally functioning mechanical mitral valve.   She returns for followup of CHF. Weight is down 5 lbs.  She is generally doing well. Mild exertional fatigue. Occasional dry cough. [facility].  She sometimes walks on a treadmill, this fatigues her but she does not get short of breath. No dyspnea walking on flat ground.  No BRBPR/melena.   Labs (9/17): LDL 87, HDL 61 Labs (12/17): HCT 36.7 Labs (1/18): K 3.7, creatinine 1.09, TSH normal, hgb 11.7 Labs (4/18): K 4.1, creatinine 1.37, hgb 11.9 Labs (11/18): K 4.3, creatinine 1.5, tbili 1.2, ALT/AST normal Labs (12/18): LDL 91, HDL 55, hgb 10.9, K 3.7, creatinine 1.22 Labs (3/19): K 3.9, creatinine 1.35  PMH: 1. Rheumatic heart disease: Status post mechanical MV replacement and tricuspid [facility] 2002.  Echo 5/12 with EF 60-65%, mild AI, mechanical MV prosthesis with normal function, severe LAE, moderate RAE, moderate to severe TR, mildly elevated PA systolic pressure.  Echo (6/15) EF 55-60%, normal RV, normal mechanical mitral valve, severe LAE, moderate TR.  Echo (9/16) with EF 55-60%, mild to moderate AI, mechanical mitral valve looks ok, s/p TV repair with mild TR, PASP 40.  - Echo (4/18): EF 60-65%, mechanical mitral valve functioned normally, mild AI, repaired tricuspid valve with mild TR, no TS; PASP 33 mmHg.  2. Chronic atrial fibrillation on coumadin.  Event monitor (7/12-8/12) showed no high rate episodes (patient continuously in atrial fibrillation).  3. Chronic diastolic CHF 4. ETT-myoview (7/12): No evidence for ischemia or infarction.  5. Impaired fasting glucose 6. Hyperlipidemia  7. HTN 8.  Gout 9. Anemia 10. External hemorrhoids.  11. 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  13. CKD 14. Fatty liver 15. GERD  SH: Nonsmoker. [place] in [place]   FH: No premature CAD  ROS: All systems reviewed and negative except as noted in HPI.    Current Outpatient Medications  Medication Sig Dispense Refill  . ACCU-CHEK SOFTCLIX LANCETS lancets USE AS DIRECTED TO test blood sugar 100 each 6  . acetaminophen (TYLENOL) 500 MG tablet Take 500-1,000 mg by mouth every 6 (six) hours as needed for fever (pain).    Marland Kitchen. allopurinol (ZYLOPRIM) 300 MG tablet TAKE one tablet BY MOUTH EVERY DAY 30 tablet 11  . aspirin 81 MG chewable tablet Chew 81 mg by mouth daily.    . benzonatate (TESSALON) 100 MG capsule Take 1 capsule (100 mg total) by mouth 2 (two) times daily as needed for cough. 20 capsule 0  . cyanocobalamin 500 MCG tablet Take 1 tablet (500 mcg total) by mouth daily. 30 tablet 5  . cycloSPORINE (RESTASIS) 0.05 % ophthalmic emulsion Place 1 drop into both eyes 2 (two) times daily.    Marland Kitchen. dextromethorphan-guaiFENesin (TUSSIN DM) 10-100 MG/5ML liquid Take 5 mLs every 4 (four) hours as needed by mouth for cough. 237 mL 0  . diazepam (VALIUM) 5 MG tablet TAKE 1 TABLET BY MOUTH EVERY 8 HOURS AS NEEDED FOR ANXIETY    . diclofenac sodium (VOLTAREN) 1 % GEL Apply 2 g topically 4 (four) times daily. 100 g 3  . folic  acid (FOLVITE) 1 MG tablet Take 1 tablet (1 mg total) by mouth daily. 30 tablet 3  . glucose blood (ACCU-CHEK AVIVA PLUS) test strip Use as instructed 100 each 3  . isosorbide mononitrate (IMDUR) 30 MG 24 hr tablet Take 0.5 tablets (15 mg total) by mouth daily.    Marland Kitchen loratadine (CLARITIN) 10 MG tablet Take 10 mg by mouth daily as needed for allergies.     Marland Kitchen meclizine (ANTIVERT) 25 MG tablet Take 1 tablet (25 mg total) by mouth 3 (three) times daily as needed for dizziness. CONTACT PCP FOR FURTHER REFILLS 30 tablet 3  .  metFORMIN (GLUCOPHAGE) 500 MG tablet TAKE 2 TABLETS BY MOUTH TWICE A DAY WITH MEALS 120 tablet 0  . metoprolol succinate (TOPROL-XL) 25 MG 24 hr tablet Take 50 mg by mouth daily.    . metoprolol succinate (TOPROL-XL) 50 MG 24 hr tablet Take 1 tablet (50 mg total) by mouth daily. 30 tablet 3  . nitroGLYCERIN (NITROSTAT) 0.4 MG SL tablet Place 0.4 mg under the tongue every 5 (five) minutes as needed for chest pain.    Marland Kitchen Olopatadine HCl (PAZEO) 0.7 % SOLN Place 1 drop into both eyes daily as needed (itching/ irritation).    Marland Kitchen omeprazole (PRILOSEC) 20 MG capsule TAKE ONE CAPSULE BY MOUTH TWICE DAILY BEFORE meals 60 capsule 2  . potassium chloride SA (K-DUR,KLOR-CON) 20 MEQ tablet Take 2 tablets (40 mEq total) by mouth 2 (two) times daily. 120 tablet 6  . simvastatin (ZOCOR) 10 MG tablet TAKE one tablet BY MOUTH EVERY DAY 30 tablet 6  . torsemide (DEMADEX) 20 MG tablet Take 4 tabs in AM and 2 tabs in PM 180 tablet 3  . warfarin (COUMADIN) 2 MG tablet TAKE ONE-HALF TO 1 TABLET BY MOUTH DAILY AS DIRECTED by THE coumadin clinic 90 tablet 1  . Menthol-Methyl Salicylate (TIGER BALM LINIMENT EX) Apply 1 application topically daily.     No current facility-administered medications for this encounter.     BP 110/64   Pulse 79   Wt 151 lb 6.4 oz (68.7 kg)   SpO2 96%   BMI 29.57 kg/m  General: NAD Neck: No JVD, no thyromegaly or thyroid nodule.  Lungs: Clear to auscultation bilaterally with normal respiratory effort. CV: Nondisplaced PMI.  Heart irregular S1/S2 with mechanical S1, no S3/S4, no murmur.  No peripheral edema.  No carotid bruit.  Normal pedal pulses.  Abdomen: Soft, nontender, no hepatosplenomegaly, no distention.  Skin: Intact without lesions or rashes.  Neurologic: Alert and oriented x 3.  Psych: Normal affect. Extremities: No clubbing or cyanosis.  HEENT: Normal.   Assessment/Plan: 1. Atrial fibrillation: Chronic.  Continue warfarin.  Continue Toprol XL 50 mg daily.     2.  Mechanical mitral valve: Continue ASA 81 daily and coumadin with INR goal 2.5-3.5.  Will need bridging with heparin/Lovenox if has to come off coumadin.  Needs endocarditis prophylaxis with any dental work. Mechanical MV looked ok on echo in 4/18.  - CBC today.  3. Diastolic CHF:  She does not appear volume overloaded on exam and weight is down. NYHA class II symptoms.  - Continue torsemide 80 qam/40 qpm. BMET today.  - Repeat echo in 4 months with followup.  Marca Ancona 02/16/2018

## 2018-02-17 ENCOUNTER — Other Ambulatory Visit (HOSPITAL_COMMUNITY): Payer: Self-pay | Admitting: Cardiology

## 2018-02-19 ENCOUNTER — Other Ambulatory Visit (HOSPITAL_COMMUNITY): Payer: Self-pay

## 2018-02-19 MED ORDER — FOLIC ACID 1 MG PO TABS: 1.0000 mg | ORAL_TABLET | Freq: Every day | ORAL | 3 refills | Status: DC

## 2018-02-21 ENCOUNTER — Other Ambulatory Visit (HOSPITAL_COMMUNITY): Payer: Self-pay | Admitting: Cardiology

## 2018-03-04 ENCOUNTER — Ambulatory Visit (INDEPENDENT_AMBULATORY_CARE_PROVIDER_SITE_OTHER): Payer: Medicare Other | Admitting: Orthopaedic Surgery

## 2018-03-04 ENCOUNTER — Ambulatory Visit (INDEPENDENT_AMBULATORY_CARE_PROVIDER_SITE_OTHER): Payer: Medicare Other

## 2018-03-04 DIAGNOSIS — M79645 Pain in left finger(s): Secondary | ICD-10-CM | POA: Diagnosis not present

## 2018-03-04 NOTE — Progress Notes (Signed)
Office Visit Note   Patient: Deborah [name]           Date of Birth: 06/23/1951           MRN: [id number] Visit Date: 03/04/2018              Requested by: Freddrick MarchAmin, Yashika, MD [address] [place], [place] [id number] PCP: Freddrick MarchAmin, Yashika, MD   Assessment & Plan: Visit Diagnoses:  1. Pain in left finger(s)     Plan: Impression is left ring finger PIP radial collateral ligament partial tear.  Finger is not grossly unstable.  I recommend symptomatic treatment with ice and rest and NSAIDs as needed.  Anticipate fully recover over the next several months.  Questions encouraged and answered.  Follow-up as needed.  Follow-Up Instructions: Return if symptoms worsen or fail to improve.   Orders:  Orders Placed This Encounter  Procedures  . XR Finger Ring Left   No orders of the defined types were placed in this encounter.     Procedures: No procedures performed   Clinical Data: No additional findings.   Subjective: Chief Complaint  Patient presents with  . Left Ring Finger - Pain, Injury    Larey SeatFell one month ago, hitting finger against a recliner    Patient is a very pleasant 67 year old female who comes in for left ring finger pain over the PIP joint for about a month.  She injured it while she was sitting in a recliner and she hit her hand on her floor.  She now has pain mainly over the PIP joint.  Bruising.   Review of Systems  Constitutional: Negative.   HENT: Negative.   Eyes: Negative.   Respiratory: Negative.   Cardiovascular: Negative.   Endocrine: Negative.   Musculoskeletal: Negative.   Neurological: Negative.   Hematological: Negative.   Psychiatric/Behavioral: Negative.   All other systems reviewed and are negative.    Objective: Vital Signs: There were no vitals taken for this visit.  Physical Exam  Constitutional: She is oriented to person, place, and time. She appears well-developed and well-nourished.  Pulmonary/Chest: Effort normal.    Neurological: She is alert and oriented to person, place, and time.  Skin: Skin is warm. Capillary refill takes less than 2 seconds.  Psychiatric: She has a normal mood and affect. Her behavior is normal. Judgment and thought content normal.  Nursing note and vitals reviewed.   Ortho Exam Left ring finger exam does show some mild swelling and bruising.  There is mild laxity of the accessory radial collateral ligament of the PIP joint.  There is a good firm endpoint.  Flexor and extensor mechanism intact. Specialty Comments:  No specialty comments available.  Imaging: No results found.   PMFS History: Patient Active Problem List   Diagnosis Date Noted  . Ear pain 01/22/2018  . Chronic pain of both ankles 09/30/2017  . Pain in joint involving ankle and foot 09/16/2017  . NAFLD (nonalcoholic fatty liver disease) 95/62/130810/03/2017  . Knee pain, bilateral 04/12/2017  . Low back pain without sciatica 06/12/2016  . Nonspecific chest pain   . Dehydration 04/12/2016  . Vision changes 02/21/2016  . RUQ pain 03/24/2015  . 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 Chest pain 02/24/2014  . Encounter for therapeutic drug monitoring 09/30/2013  . 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 H/O prosthetic heart valve 07/08/2013  . Eosinophilia 01/27/2013  . Hypotension 12/12/2012  . Microcytic anemia 05/01/2012  .  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 Postprocedural state 05/22/2011  . Diastolic CHF, chronic (HCC) 03/15/2011  . Rheumatic heart disease   . Chronic atrial fibrillation (HCC)   . Chronic anticoagulation   . DM (diabetes mellitus) (HCC)   . Hyperlipidemia   . Mitral valve disorder 11/16/2010  . GOUT 11/29/2009  . TRICUSPID REGURGITATION, MODERATE WITH MILD PULMONARY HTN 11/29/2009  . HYPERBILIRUBINEMIA 06/22/2009  . HYPERGLYCEMIA 06/21/2009  . Hyperlipidemia associated with type 2 diabetes mellitus (HCC) 01/07/2009  . GERD 01/07/2009  . CARPAL TUNNEL  SYNDROME, LEFT 11/11/2008   Past Medical History:  Diagnosis Date  . Allergic rhinitis   . Anemia   . Borderline diabetes   . Carpal tunnel syndrome   . Chronic atrial fibrillation (HCC)    coumad[facility] CV[facility];  Event montor (7/12-8/12) showed no high rate episodes (patient continuously in atrial fibrillation).   . Chronic diastolic heart failure (HCC)   . Eosinophilia 01/27/2013  . External hemorrhoid   . External hemorrhoids   . Fatigue   . Gout   . 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 Gaylyn Rong  . Hx of cardiovascular stress test    ETT-myoview (7/12): No evidence for ischemia or infarction  . Hyperlipidemia   . Hypertension   . Obesity   . Rheumatic heart disease    Status post mechanical ([employer]) mitral valve replacement and tricuspid valve repair at [facility] in 2002; echo 5/12:   EF 60-65%, mild AI, mitral valve prosthesis with AVA 1.66, severe LAE, moderate RAE, moderate to severe TR, mild increased pulmonary artery systolic pressure;    Adenosine Cardiolite in 4/09:   No ischemia, EF 66%  . Rotator cuff syndrome     Family History  Problem Relation Age of Onset  . Stroke Mother   . Diabetes Mother   . Heart failure Mother        pacemaker  . Heart disease Father        "heart problems"  . Heart disease Sister   . Heart disease Brother   . Gout Brother   . Diabetes Brother   . Stroke Brother   . Valvular heart disease Sister     Past Surgical History:  Procedure Laterality Date  . CARDIAC CATHETERIZATION     EF 55%  . TRICUSPID VALVE REPAIR     Social History   Occupational History  . Not on file  Tobacco Use  . Smoking status: Never Smoker  . Smokeless tobacco: Never Used  Substance and Sexual Activity  . Alcohol use: No  . Drug use: No  . Sexual activity: Not on file

## 2018-03-11 ENCOUNTER — Other Ambulatory Visit: Payer: Self-pay | Admitting: Family Medicine

## 2018-03-12 ENCOUNTER — Ambulatory Visit (INDEPENDENT_AMBULATORY_CARE_PROVIDER_SITE_OTHER): Payer: Medicare Other | Admitting: Pharmacist

## 2018-03-12 ENCOUNTER — Other Ambulatory Visit: Payer: Self-pay | Admitting: Family Medicine

## 2018-03-12 DIAGNOSIS — I059 Rheumatic mitral valve disease, unspecified: Secondary | ICD-10-CM | POA: Diagnosis not present

## 2018-03-12 DIAGNOSIS — Z5181 Encounter for therapeutic drug level monitoring: Secondary | ICD-10-CM

## 2018-03-12 DIAGNOSIS — Z7901 Long term (current) use of anticoagulants: Secondary | ICD-10-CM | POA: Diagnosis not present

## 2018-03-12 LAB — POCT INR: INR: 2.7 (ref 2.0–3.0)

## 2018-03-12 NOTE — Patient Instructions (Signed)
Description   Continue same dose of  Coumadin 1 tablet (2mg) daily except 1/2 tablet (1mg) on Mondays and Thursdays.  Recheck in 4 weeks.      

## 2018-04-04 ENCOUNTER — Other Ambulatory Visit (HOSPITAL_COMMUNITY): Payer: Self-pay | Admitting: Cardiology

## 2018-04-07 ENCOUNTER — Other Ambulatory Visit: Payer: Self-pay | Admitting: Family Medicine

## 2018-04-07 ENCOUNTER — Telehealth: Payer: Self-pay

## 2018-04-07 NOTE — Telephone Encounter (Signed)
Diclofenac gel approved per CoverMyMeds. [facility] pharmacy notified. Ples SpecterAlisa Mirna Sutcliffe, RN [facility] RN)

## 2018-04-07 NOTE — Telephone Encounter (Signed)
Received fax from pharmacy, PA needed on Diclofenac gel. Clinical questions submitted via Cover My Meds. Waiting on response, could take up to 72 hours.  Cover My Meds info: Key: [id number]  Ples SpecterAlisa Addeline Calarco, RN [facility] RN)

## 2018-04-08 ENCOUNTER — Ambulatory Visit (INDEPENDENT_AMBULATORY_CARE_PROVIDER_SITE_OTHER): Payer: Medicare Other | Admitting: Pharmacist

## 2018-04-08 ENCOUNTER — Other Ambulatory Visit: Payer: Self-pay | Admitting: Family Medicine

## 2018-04-08 DIAGNOSIS — Z5181 Encounter for therapeutic drug level monitoring: Secondary | ICD-10-CM

## 2018-04-08 DIAGNOSIS — I059 Rheumatic mitral valve disease, unspecified: Secondary | ICD-10-CM

## 2018-04-08 DIAGNOSIS — Z7901 Long term (current) use of anticoagulants: Secondary | ICD-10-CM | POA: Diagnosis not present

## 2018-04-08 LAB — POCT INR: INR: 2.6 (ref 2.0–3.0)

## 2018-04-08 NOTE — Patient Instructions (Signed)
Description   Continue same dose of  Coumadin 1 tablet (2mg ) daily except 1/2 tablet (1mg ) on Mondays and Thursdays.  Recheck in 5 weeks.

## 2018-04-14 ENCOUNTER — Ambulatory Visit: Payer: Medicare Other | Admitting: Family Medicine

## 2018-04-15 ENCOUNTER — Other Ambulatory Visit (HOSPITAL_COMMUNITY): Payer: Self-pay | Admitting: Cardiology

## 2018-05-13 ENCOUNTER — Ambulatory Visit (INDEPENDENT_AMBULATORY_CARE_PROVIDER_SITE_OTHER): Payer: Medicare Other

## 2018-05-13 DIAGNOSIS — I059 Rheumatic mitral valve disease, unspecified: Secondary | ICD-10-CM | POA: Diagnosis not present

## 2018-05-13 DIAGNOSIS — Z5181 Encounter for therapeutic drug level monitoring: Secondary | ICD-10-CM

## 2018-05-13 DIAGNOSIS — Z7901 Long term (current) use of anticoagulants: Secondary | ICD-10-CM | POA: Diagnosis not present

## 2018-05-13 LAB — POCT INR: INR: 3.5 — AB (ref 2.0–3.0)

## 2018-05-13 NOTE — Patient Instructions (Signed)
Have a serving of greens today, and continue same dose of  Coumadin 1 tablet (2mg ) daily except 1/2 tablet (1mg ) on Mondays and Thursdays.  Recheck in 6 weeks.

## 2018-05-21 ENCOUNTER — Ambulatory Visit (INDEPENDENT_AMBULATORY_CARE_PROVIDER_SITE_OTHER): Payer: Medicare Other | Admitting: Family Medicine

## 2018-05-21 ENCOUNTER — Telehealth: Payer: Self-pay

## 2018-05-21 VITALS — BP 98/65 | HR 64 | Temp 97.9°F | Wt 149.0 lb

## 2018-05-21 DIAGNOSIS — E785 Hyperlipidemia, unspecified: Secondary | ICD-10-CM | POA: Diagnosis not present

## 2018-05-21 DIAGNOSIS — Z23 Encounter for immunization: Secondary | ICD-10-CM | POA: Diagnosis not present

## 2018-05-21 DIAGNOSIS — E1169 Type 2 diabetes mellitus with other specified complication: Secondary | ICD-10-CM

## 2018-05-21 DIAGNOSIS — E118 Type 2 diabetes mellitus with unspecified complications: Secondary | ICD-10-CM

## 2018-05-21 LAB — POCT GLYCOSYLATED HEMOGLOBIN (HGB A1C): HBA1C, POC (CONTROLLED DIABETIC RANGE): 5.9 % (ref 0.0–7.0)

## 2018-05-21 MED ORDER — OMEPRAZOLE 20 MG PO CPDR: DELAYED_RELEASE_CAPSULE | ORAL | 1 refills | Status: DC

## 2018-05-21 MED ORDER — METFORMIN HCL 500 MG PO TABS: 500.0000 mg | ORAL_TABLET | Freq: Every day | ORAL | 0 refills | Status: DC

## 2018-05-21 MED ORDER — GLUCOSE BLOOD VI STRP: ORAL_STRIP | 9 refills | Status: DC

## 2018-05-21 NOTE — Patient Instructions (Signed)
It was nice seeing you again today.  You were seen in clinic for follow-up of your diabetes.  Your A1c today was 5.9 which shows good glucose control.  I have decreased your Metformin to 500 mg once a day with breakfast.   We will recheck your A1c in 3 months time.  If at that time, you continue to have good control, we can try to take you off metformin completely and see how you do.  We also did some blood work today for your cholesterol and received your flu shot.  I will call you once I have the results of these.   Please call clinic if you have any questions.  I will see you in 3 months.  Freddrick March MD

## 2018-05-21 NOTE — Progress Notes (Signed)
   Subjective:   Patient ID: Deborah [name]    DOB: 10/17/1950, 67 y.o. female   MRN: [id number]  CC: f/u T2DM, flu shot, labs  HPI: Deborah [name] is a 67 y.o. female who presents to clinic today for the following issues.  Hyperlipidemia Takes simvastatin 10 mg daily with good compliance.  She requests a recheck of her cholesterol today.    T2DM Feels she has good control of blood sugars.  She checks blood sugars everyday in the AM (fasting AM and at night), her CBG values are about 120 fasting and nighttime is 140-150 after dinner.  She needs a refill of her test strips.   -Meds: Metformin 500 mg BID, reports good compliance -Diet: eats rice and bread, noodles, vegetables, fruits, no juice or soda, drinks mainly water and smoothies with blueberries and strawberries -Exercise: goes to the pool and walks in water daily for 45 min to 1 hour  -Diabetic eye exam last year. She is due for another exam, and will schedule  -Foot exam a few months ago  Health Maintenance -flu shot given today   ROS: No fevers, chills, nausea, vomiting.  No CP, SOB, leg swelling.   Social: pt is a never smoker.  Medications reviewed. Objective:   BP 98/65   Pulse 64   Temp 97.9 F (36.6 C) (Oral)   Wt 149 lb (67.6 kg)   SpO2 97%   BMI 29.10 kg/m  Vitals and nursing note reviewed.  General: 67 y/o female, NAD  Neck: supple CV: RRR no MRG  Lungs: CTAB, normal effort  Abdomen: soft, NTND, +bs  Skin: warm, dry, no rash Extremities: warm and well perfused, normal tone Neuro: alert, oriented x3, no focal deficits   Assessment & Plan:   Hyperlipidemia associated with type 2 diabetes mellitus Taking simvastatin 10 mg with good compliance.   Last labs from 07/2017 with LDL 91, HDL 55, TG 186.  Recheck lipid panel today  DM (diabetes mellitus) Last a1c 6.0 in 05/2017, today 5.9.  Discussed decreasing Metformin to 500 mg once daily in the morning and she is agreeable to this.   -Encourage  continue diet and current physical exercise  -Will recheck A1c in 3 months  -If at that time she has well controlled a1c, can consider taking her off Metformin  -Test strips refilled  Health Maintenance -flu shot given today   Orders Placed This Encounter  Procedures  . Flu Vaccine QUAD 36+ mos IM  . Lipid panel  . HgB A1c   Meds ordered this encounter  Medications  . omeprazole (PRILOSEC) 20 MG capsule    Sig: TAKE ONE CAPSULE BY MOUTH TWICE A DAY BEFORE MEALS. INSURANCE ONLY COVERS 1 A DAY    Dispense:  60 capsule    Refill:  1  . DISCONTD: glucose blood (ACCU-CHEK AVIVA PLUS) test strip    Sig: Use as instructed    Dispense:  200 each    Refill:  9    This prescription was filled on 10/18/2017. Any refills authorized will be placed on file.  . metFORMIN (GLUCOPHAGE) 500 MG tablet    Sig: Take 1 tablet (500 mg total) by mouth daily with breakfast.    Dispense:  120 tablet    Refill:  0   Freddrick MarchYashika Clayden Withem, MD [facility], PGY-3 05/28/2018 4:40 PM

## 2018-05-22 LAB — LIPID PANEL
Chol/HDL Ratio: 3.2 ratio (ref 0.0–4.4)
Cholesterol, Total: 182 mg/dL (ref 100–199)
HDL: 57 mg/dL (ref >39)
LDL Calculated: 86 mg/dL (ref 0–99)
Triglycerides: 195 mg/dL — ABNORMAL HIGH (ref 0–149)
VLDL Cholesterol Cal: 39 mg/dL (ref 5–40)

## 2018-05-23 ENCOUNTER — Other Ambulatory Visit: Payer: Self-pay | Admitting: Family Medicine

## 2018-05-23 MED ORDER — GLUCOSE BLOOD VI STRP: ORAL_STRIP | 9 refills | Status: DC

## 2018-05-23 NOTE — Telephone Encounter (Signed)
Called pharmacy to discuss, they said a new order had to be placed electronically so I have done that.  Thanks!

## 2018-05-26 NOTE — Telephone Encounter (Signed)
Note from pharmacy: NEED ICD.10 CODE ON HARDCOPY FOR MEDICARE.  Also needs to be sent by [facility] certified provider. [name], Maryjo Rochester, CMA

## 2018-05-28 NOTE — Assessment & Plan Note (Signed)
Last a1c 6.0 in 05/2017, today 5.9.  Discussed decreasing Metformin to 500 mg once daily in the morning and she is agreeable to this.   -Encourage continue diet and current physical exercise  -Will recheck A1c in 3 months  -If at that time she has well controlled a1c, can consider taking her off Metformin  -Test strips refilled

## 2018-05-28 NOTE — Assessment & Plan Note (Signed)
Taking simvastatin 10 mg with good compliance.   Last labs from 07/2017 with LDL 91, HDL 55, TG 186.  Recheck lipid panel today

## 2018-05-29 MED ORDER — GLUCOSE BLOOD VI STRP: ORAL_STRIP | 9 refills | Status: DC

## 2018-05-29 NOTE — Addendum Note (Signed)
Addended by: Manson Passey, CARINA on: 05/29/2018 06:00 PM   Modules accepted: Orders

## 2018-05-29 NOTE — Telephone Encounter (Signed)
Discussed with Dr. Manson Passey and she asked me to route to her so she can refill the strips.  Thanks

## 2018-05-30 ENCOUNTER — Other Ambulatory Visit (HOSPITAL_COMMUNITY): Payer: Self-pay | Admitting: Internal Medicine

## 2018-06-06 ENCOUNTER — Other Ambulatory Visit: Payer: Self-pay | Admitting: Family Medicine

## 2018-06-24 ENCOUNTER — Ambulatory Visit (INDEPENDENT_AMBULATORY_CARE_PROVIDER_SITE_OTHER): Payer: Medicare Other | Admitting: *Deleted

## 2018-06-24 DIAGNOSIS — I059 Rheumatic mitral valve disease, unspecified: Secondary | ICD-10-CM | POA: Diagnosis not present

## 2018-06-24 DIAGNOSIS — Z5181 Encounter for therapeutic drug level monitoring: Secondary | ICD-10-CM | POA: Diagnosis not present

## 2018-06-24 DIAGNOSIS — Z7901 Long term (current) use of anticoagulants: Secondary | ICD-10-CM | POA: Diagnosis not present

## 2018-06-24 LAB — POCT INR: INR: 2.2 (ref 2.0–3.0)

## 2018-06-24 NOTE — Patient Instructions (Signed)
Description   Today take 1.5 tablets then continue same dose of  Coumadin 1 tablet (2mg ) daily except 1/2 tablet (1mg ) on Mondays and Thursdays.  Recheck in 4 weeks.

## 2018-07-04 ENCOUNTER — Other Ambulatory Visit (HOSPITAL_COMMUNITY): Payer: Self-pay | Admitting: Cardiology

## 2018-07-11 ENCOUNTER — Encounter: Payer: Self-pay | Admitting: Family Medicine

## 2018-07-11 ENCOUNTER — Ambulatory Visit: Payer: Medicare Other | Admitting: Family Medicine

## 2018-07-11 ENCOUNTER — Ambulatory Visit (INDEPENDENT_AMBULATORY_CARE_PROVIDER_SITE_OTHER): Payer: Medicare Other | Admitting: Family Medicine

## 2018-07-11 DIAGNOSIS — R059 Cough, unspecified: Secondary | ICD-10-CM | POA: Insufficient documentation

## 2018-07-11 DIAGNOSIS — R05 Cough: Secondary | ICD-10-CM

## 2018-07-11 MED ORDER — BENZONATATE 100 MG PO CAPS: 100.0000 mg | ORAL_CAPSULE | Freq: Two times a day (BID) | ORAL | 0 refills | Status: DC | PRN

## 2018-07-11 MED ORDER — HYDROCODONE-CHLORPHENIRAMINE 5-4 MG/5ML PO SOLN: ORAL | 0 refills | Status: DC

## 2018-07-11 NOTE — Patient Instructions (Signed)
Good to see you today!  Thanks for coming in.  I think you have a bad cold  If it is not gone in 7 days or have high fever or shortness of breath or persistent nausea and vomiting then come back  Use the tessalon perles as needed for cough. Use a humidifier when you sleep Take the hydrocodone only for severe cough - do not take with valium

## 2018-07-11 NOTE — Progress Notes (Signed)
Subjective  Deborah [name] is a 67 y.o. female is presenting with the following  COUGH  Has been coughing for 2 days. Cough is: bothersome. Keeps up at night Sputum production: small amount of white Medications tried: took old Rx for hycodan which helped  Symptoms Runny nose: yes clear Mucous in back of throat: mild Throat burning or reflux: no Wheezing or asthma: no Fever: no but a few chills.  Her grand daughter had similar symptoms last week Chest Pain: no Shortness of breath: no Leg swelling: no Hemoptysis: no Weight loss: no  ROS see HPI Smoking Status noted   Chief Complaint noted Review of Symptoms - see HPI PMH - Smoking status noted.    Objective Vital Signs reviewed BP 100/70   Pulse 92   Temp 98.4 F (36.9 C) (Oral)   Wt 150 lb 12.8 oz (68.4 kg)   SpO2 96%   BMI 29.45 kg/m  Alert interqactive NAD Lungs:  Normal respiratory effort, chest expands symmetrically. Lungs are clear to auscultation, no crackles or wheezes. Heart RRR with valve sounds Abdomen: soft and non-tender without masses, organomegaly or hernias noted.  No guarding or rebound Extremities:  No cyanosis, edema, or deformity noted with good range of motion of all major joints.   Skin:  Intact without suspicious lesions or rashes Throat: normal mucosa, no exudate, uvula midline, no redness  Assessments/Plans  Cough Exam and presentation consistent with viral cough. No signs of pneumonia or PE or heart failure.   Patient very much would like hydrocodone for cough and sleep.  Agreed to prescribe small amount.   Recommend daytime use of tessalon.  See after visit summary for instructions

## 2018-07-11 NOTE — Assessment & Plan Note (Signed)
Exam and presentation consistent with viral cough. No signs of pneumonia or PE or heart failure.   Patient very much would like hydrocodone for cough and sleep.  Agreed to prescribe small amount.   Recommend daytime use of tessalon.  See after visit summary for instructions

## 2018-07-15 ENCOUNTER — Ambulatory Visit (INDEPENDENT_AMBULATORY_CARE_PROVIDER_SITE_OTHER): Payer: Medicare Other | Admitting: Family Medicine

## 2018-07-15 ENCOUNTER — Ambulatory Visit (HOSPITAL_COMMUNITY)
Admission: RE | Admit: 2018-07-15 | Discharge: 2018-07-15 | Disposition: A | Discharge status: Home / Self Care | Payer: Medicare Other | Source: Ambulatory Visit | Attending: Family Medicine | Admitting: Family Medicine

## 2018-07-15 ENCOUNTER — Other Ambulatory Visit: Payer: Self-pay

## 2018-07-15 ENCOUNTER — Encounter: Payer: Self-pay | Admitting: Family Medicine

## 2018-07-15 VITALS — BP 106/65 | HR 80 | Temp 98.1°F | Wt 150.0 lb

## 2018-07-15 DIAGNOSIS — I517 Cardiomegaly: Secondary | ICD-10-CM | POA: Insufficient documentation

## 2018-07-15 DIAGNOSIS — R059 Cough, unspecified: Secondary | ICD-10-CM

## 2018-07-15 DIAGNOSIS — R05 Cough: Secondary | ICD-10-CM | POA: Diagnosis present

## 2018-07-15 NOTE — Progress Notes (Signed)
   CC: cough   HPI  Cough - more mucous production than previous, last fever was not measured, felt hot and cold. Has had some sweats. Tried tylenol, tussinex. Cough helped by cough syrup. More mucous today. Coughed out some rust tinged sputum. Has had something similar in the past winters. grandkids are also sick now, getting better. Her chest is hurting, feels like heaviness. Doesn't feel like her heart. Feels tired and heavy after coughing. Normal walk and no DOE. Eating ok. Drinking water. No inhalers at home.   ROS: Denies CP, SOB, abdominal pain, dysuria, changes in BMs.   CC, SH/smoking status, and VS noted  Objective: BP 106/65   Pulse 80   Temp 98.1 F (36.7 C) (Oral)   Wt 150 lb (68 kg)   SpO2 93%   BMI 29.29 kg/m  Gen: NAD, alert, cooperative, and pleasant. HEENT: NCAT, EOMI, PERRL CV: RRR, no murmur Resp: CTAB, no wheezes, non-labored Abd: SNTND, BS present, no guarding or organomegaly Ext: No edema, warm Neuro: Alert and oriented, Speech clear, No gross deficits  Assessment and plan:  1. Cough Unclear etiology, certainly could be continued viral infection. Given that she feels worse, we will check CXR, CBC and BMP for BUN to calculate CURB65 if CAP findings on CXR. Continue symptomatic treatment while awaiting results. Exam without abnormal findings.  - DG Chest 2 View; Future - CBC - Basic metabolic panel  Orders Placed This Encounter  Procedures  . DG Chest 2 View    Standing Status:   Future    Number of Occurrences:   1    Standing Expiration Date:   09/14/2019    Order Specific Question:   Reason for Exam (SYMPTOM  OR DIAGNOSIS REQUIRED)    Answer:   persistent cough, fever, concern for CAP    Order Specific Question:   Preferred imaging location?    [facility]   . CBC  . Basic metabolic panel    No orders of the defined types were placed in this encounter.   Loni MuseKate Yanice Maqueda, MD, PGY3 07/15/2018 4:24 PM

## 2018-07-15 NOTE — Patient Instructions (Signed)
It was a pleasure to see you today! Thank you for choosing [facility] for your primary care. Deborah [name] was seen for cough.   Our plans for today were:  We need to take a picture of your lungs, go over to [facility] to do this.   I will call you with the results.   Best,  Dr. Chanetta Marshallimberlake

## 2018-07-16 ENCOUNTER — Inpatient Hospital Stay: Payer: Medicare Other | Attending: Hematology | Admitting: Hematology

## 2018-07-16 ENCOUNTER — Telehealth: Payer: Self-pay | Admitting: Family Medicine

## 2018-07-16 ENCOUNTER — Telehealth: Payer: Self-pay | Admitting: Hematology

## 2018-07-16 ENCOUNTER — Inpatient Hospital Stay: Payer: Medicare Other

## 2018-07-16 LAB — BASIC METABOLIC PANEL
BUN / CREAT RATIO: 14 (ref 12–28)
BUN: 16 mg/dL (ref 8–27)
CALCIUM: 9.1 mg/dL (ref 8.7–10.3)
CO2: 24 mmol/L (ref 20–29)
CREATININE: 1.16 mg/dL — AB (ref 0.57–1.00)
Chloride: 101 mmol/L (ref 96–106)
GFR calc Af Amer: 57 mL/min/{1.73_m2} — ABNORMAL LOW (ref >59)
GFR calc non Af Amer: 50 mL/min/{1.73_m2} — ABNORMAL LOW (ref >59)
GLUCOSE: 145 mg/dL — AB (ref 65–99)
Potassium: 4 mmol/L (ref 3.5–5.2)
SODIUM: 142 mmol/L (ref 134–144)

## 2018-07-16 LAB — CBC
Hematocrit: 36.4 % (ref 34.0–46.6)
Hemoglobin: 11.1 g/dL (ref 11.1–15.9)
MCH: 23.3 pg — ABNORMAL LOW (ref 26.6–33.0)
MCHC: 30.5 g/dL — ABNORMAL LOW (ref 31.5–35.7)
MCV: 77 fL — ABNORMAL LOW (ref 79–97)
Platelets: 218 10*3/uL (ref 150–450)
RBC: 4.76 x10E6/uL (ref 3.77–5.28)
RDW: 16.4 % — ABNORMAL HIGH (ref 12.3–15.4)
WBC: 8.3 10*3/uL (ref 3.4–10.8)

## 2018-07-16 MED ORDER — HYDROCODONE-CHLORPHENIRAMINE 5-4 MG/5ML PO SOLN: ORAL | 0 refills | Status: DC

## 2018-07-16 NOTE — Telephone Encounter (Signed)
Called daughter to relay results from CXR, CBC, BMP. Daughter says she is still coughing a lot and losing her voice a little bit. Offered refill of cough syrup vs ipratropium vs antihistamines. They opt for refill of cough syrup. Counseled again on expected duration of cough sxs, up to 6 wks. Family will call if worsening or continued concerns.

## 2018-07-16 NOTE — Telephone Encounter (Signed)
Per 11/19 sch message -   left message for patient to call back to r/s appt -

## 2018-07-22 ENCOUNTER — Ambulatory Visit (INDEPENDENT_AMBULATORY_CARE_PROVIDER_SITE_OTHER): Payer: Medicare Other

## 2018-07-22 DIAGNOSIS — I059 Rheumatic mitral valve disease, unspecified: Secondary | ICD-10-CM | POA: Diagnosis not present

## 2018-07-22 DIAGNOSIS — Z7901 Long term (current) use of anticoagulants: Secondary | ICD-10-CM | POA: Diagnosis not present

## 2018-07-22 DIAGNOSIS — Z5181 Encounter for therapeutic drug level monitoring: Secondary | ICD-10-CM | POA: Diagnosis not present

## 2018-07-22 LAB — POCT INR: INR: 3.7 — AB (ref 2.0–3.0)

## 2018-07-22 NOTE — Patient Instructions (Signed)
Today take 1/2 tablet then continue same dose of  Coumadin 1 tablet (2mg ) daily except 1/2 tablet (1mg ) on Mondays and Thursdays.  Recheck in 4 weeks.

## 2018-08-14 ENCOUNTER — Other Ambulatory Visit: Payer: Self-pay

## 2018-08-14 ENCOUNTER — Ambulatory Visit (INDEPENDENT_AMBULATORY_CARE_PROVIDER_SITE_OTHER): Payer: Medicare Other | Admitting: Family Medicine

## 2018-08-14 ENCOUNTER — Encounter: Payer: Self-pay | Admitting: Family Medicine

## 2018-08-14 VITALS — BP 114/60 | HR 86 | Temp 98.3°F | Ht 60.0 in | Wt 150.2 lb

## 2018-08-14 DIAGNOSIS — E118 Type 2 diabetes mellitus with unspecified complications: Secondary | ICD-10-CM

## 2018-08-14 DIAGNOSIS — R519 Headache, unspecified: Secondary | ICD-10-CM

## 2018-08-14 DIAGNOSIS — R51 Headache: Secondary | ICD-10-CM | POA: Diagnosis not present

## 2018-08-14 LAB — POCT GLYCOSYLATED HEMOGLOBIN (HGB A1C): HbA1c, POC (controlled diabetic range): 6.6 % (ref 0.0–7.0)

## 2018-08-14 NOTE — Assessment & Plan Note (Signed)
A1c worsening from 5.9>6.6.  Advised increasing Metformin to 500 mg BID.   Counseled on carb counts and limiting portions.  F/u A1c 3 months.

## 2018-08-14 NOTE — Progress Notes (Addendum)
   Subjective:   Patient ID: Ocie Cornfieldai Krim Tou Prong    DOB: 08/30/1950, 67 y.o. female   MRN: [id number]  CC: headache, dizziness, f/u T2DM   HPI: Nai Krim Nolon Lennertou Prong is a 67 y.o. female who presents to clinic today for the following issues.  Headaches R-sided x 2-3 months, does not radiate.  Pain comes and goes, occurs 3-4 times a week.  Severity 5/10 at it's worst, feels like a dull heaviness.  Has not taken any medicine at home for this.  Denies vision changes but has dizziness sometimes.  She is recovering from a cold and has some congestion.  No fever, chills, nausea, vomiting.  No h/o injury or fall.  She denies photophonia and phonophobia.  No numbness or tingling.  No upper extremity weakness. Denies tinnitus.  Drinks about 4-5 glasses of water a day. Does not skip meals.  Sleeps well, about 7-8 hours a night.    T2DM Per daughter, she has been really good with exercise and diet. She has recently cut back from 1000 mg BID to 500 mg daily about 3 months ago.  Checking her blood sugars every day, notes ranges 120-140 in the morning.  No hypoglycemic events reported.   -Diet: rice, bread and noodles.  Has replaced with brown rice and wheat bread.  -Exercise: walks daily, 1 hour at a time in the pool about 3-4x a week.   ROS: No fever, chills, nausea, vomiting.    Social: pt is a never smoker.  Medications reviewed. Objective:   BP 114/60   Pulse 86   Temp 98.3 F (36.8 C) (Oral)   Ht 5' (1.524 m)   Wt 150 lb 4 oz (68.2 kg)   SpO2 97%   BMI 29.34 kg/m  Vitals and nursing note reviewed.  General: 67 year old female, NAD HEENT: NCAT, EOMI, PERRL, MMM, oropharynx clear Neck: supple, non-tender, normal range of motion CV: RRR no MRG  Lungs: CTAB, normal effort   Abdomen: soft, +bs  Skin: warm, dry, no rash Extremities: warm and well perfused, normal tone Neuro: alert, oriented x3, no focal deficits   Assessment & Plan:   Headache Likely related to dehydration, no red flags on  exam and without systemic symptoms.  No tenderness over temporal regions to suspect temporal arteritis.  -Discussed increasing her water intake to stay well-hydrated -Advised to keep a headache diary to bring to next visit -Red flags and return precautions reviewed  DM (diabetes mellitus) A1c worsening from 5.9>6.6.  Advised increasing Metformin to 500 mg BID.   Counseled on carb counts and limiting portions.  F/u A1c 3 months.   Orders Placed This Encounter  Procedures  . HgB A1c   Freddrick MarchYashika Jovee Dettinger, MD [facility]

## 2018-08-14 NOTE — Patient Instructions (Signed)
It was nice seeing you again today.  You were seen in clinic for headaches and follow-up of your diabetes.  For your headaches, recommend increasing your water intake as this is a common cause of these.  Additionally, I would recommend keeping a headache diary to note when you have these symptoms.  Your A1c today was 6.6 which has slightly worsened since last visit.  I am increasing your metformin back to 500 mg twice a day.  Additionally, we discussed improving your diet by limiting portions and carbohydrates.  I will follow-up with you in 3 months for your next A1c.  Please call clinic if you have any questions.   Freddrick MarchYashika Starkeisha Vanwinkle MD

## 2018-08-16 ENCOUNTER — Other Ambulatory Visit (HOSPITAL_COMMUNITY): Payer: Self-pay | Admitting: Cardiology

## 2018-08-16 ENCOUNTER — Other Ambulatory Visit: Payer: Self-pay | Admitting: Family Medicine

## 2018-08-18 ENCOUNTER — Other Ambulatory Visit: Payer: Self-pay | Admitting: Family Medicine

## 2018-08-21 ENCOUNTER — Ambulatory Visit (INDEPENDENT_AMBULATORY_CARE_PROVIDER_SITE_OTHER): Payer: Medicare Other | Admitting: *Deleted

## 2018-08-21 DIAGNOSIS — I059 Rheumatic mitral valve disease, unspecified: Secondary | ICD-10-CM

## 2018-08-21 DIAGNOSIS — Z7901 Long term (current) use of anticoagulants: Secondary | ICD-10-CM

## 2018-08-21 DIAGNOSIS — Z5181 Encounter for therapeutic drug level monitoring: Secondary | ICD-10-CM

## 2018-08-21 LAB — POCT INR: INR: 2.6 (ref 2.0–3.0)

## 2018-08-21 NOTE — Patient Instructions (Signed)
Description   Today take 1 tablet then continue same dose of  Coumadin 1 tablet (2mg ) daily except 1/2 tablet (1mg ) on Mondays and Thursdays.  Recheck in 3 weeks.

## 2018-09-08 ENCOUNTER — Other Ambulatory Visit (HOSPITAL_COMMUNITY): Payer: Self-pay | Admitting: Internal Medicine

## 2018-09-08 ENCOUNTER — Other Ambulatory Visit (HOSPITAL_COMMUNITY): Payer: Self-pay | Admitting: Cardiology

## 2018-09-10 ENCOUNTER — Other Ambulatory Visit (HOSPITAL_COMMUNITY): Payer: Self-pay | Admitting: Cardiology

## 2018-09-11 ENCOUNTER — Ambulatory Visit (INDEPENDENT_AMBULATORY_CARE_PROVIDER_SITE_OTHER): Payer: Medicare Other

## 2018-09-11 DIAGNOSIS — Z7901 Long term (current) use of anticoagulants: Secondary | ICD-10-CM | POA: Diagnosis not present

## 2018-09-11 DIAGNOSIS — Z5181 Encounter for therapeutic drug level monitoring: Secondary | ICD-10-CM

## 2018-09-11 DIAGNOSIS — I059 Rheumatic mitral valve disease, unspecified: Secondary | ICD-10-CM

## 2018-09-11 LAB — POCT INR: INR: 2.8 (ref 2.0–3.0)

## 2018-09-11 NOTE — Patient Instructions (Signed)
Description   Continue same dose of  Coumadin 1 tablet (2mg ) daily except 1/2 tablet (1mg ) on Mondays and Thursdays.  Recheck in 4 weeks.

## 2018-09-16 ENCOUNTER — Other Ambulatory Visit: Payer: Self-pay

## 2018-09-16 MED ORDER — GLUCOSE BLOOD VI STRP: ORAL_STRIP | 12 refills | Status: DC

## 2018-09-23 ENCOUNTER — Other Ambulatory Visit: Payer: Self-pay | Admitting: Hematology

## 2018-09-23 ENCOUNTER — Other Ambulatory Visit (HOSPITAL_COMMUNITY): Payer: Self-pay | Admitting: Cardiology

## 2018-10-08 ENCOUNTER — Ambulatory Visit (INDEPENDENT_AMBULATORY_CARE_PROVIDER_SITE_OTHER): Payer: Medicare Other | Admitting: *Deleted

## 2018-10-08 DIAGNOSIS — Z7901 Long term (current) use of anticoagulants: Secondary | ICD-10-CM

## 2018-10-08 DIAGNOSIS — I059 Rheumatic mitral valve disease, unspecified: Secondary | ICD-10-CM

## 2018-10-08 DIAGNOSIS — Z5181 Encounter for therapeutic drug level monitoring: Secondary | ICD-10-CM | POA: Diagnosis not present

## 2018-10-08 LAB — POCT INR: INR: 3.6 — AB (ref 2.0–3.0)

## 2018-10-08 NOTE — Patient Instructions (Signed)
Description   Today take 1/2 tablet then continue same dose of  Coumadin 1 tablet (2mg ) daily except 1/2 tablet (1mg ) on Mondays and Thursdays.  Recheck in 4 weeks. Call [phone] with upcoming procedure date once scheduled.

## 2018-10-09 ENCOUNTER — Other Ambulatory Visit: Payer: Self-pay | Admitting: Family Medicine

## 2018-10-14 ENCOUNTER — Other Ambulatory Visit (HOSPITAL_COMMUNITY): Payer: Self-pay | Admitting: Internal Medicine

## 2018-10-14 NOTE — Telephone Encounter (Signed)
Pt needs appt for future refills 

## 2018-10-25 ENCOUNTER — Other Ambulatory Visit (HOSPITAL_COMMUNITY): Payer: Self-pay | Admitting: Cardiology

## 2018-11-04 ENCOUNTER — Other Ambulatory Visit (HOSPITAL_COMMUNITY): Payer: Self-pay | Admitting: Cardiology

## 2018-11-05 ENCOUNTER — Other Ambulatory Visit: Payer: Self-pay

## 2018-11-05 ENCOUNTER — Ambulatory Visit (INDEPENDENT_AMBULATORY_CARE_PROVIDER_SITE_OTHER): Payer: Medicare Other | Admitting: *Deleted

## 2018-11-05 ENCOUNTER — Other Ambulatory Visit (HOSPITAL_COMMUNITY): Payer: Self-pay

## 2018-11-05 DIAGNOSIS — Z5181 Encounter for therapeutic drug level monitoring: Secondary | ICD-10-CM

## 2018-11-05 DIAGNOSIS — Z7901 Long term (current) use of anticoagulants: Secondary | ICD-10-CM | POA: Diagnosis not present

## 2018-11-05 DIAGNOSIS — I059 Rheumatic mitral valve disease, unspecified: Secondary | ICD-10-CM

## 2018-11-05 LAB — POCT INR: INR: 3.4 — AB (ref 2.0–3.0)

## 2018-11-05 MED ORDER — WARFARIN SODIUM 2 MG PO TABS: ORAL_TABLET | ORAL | 1 refills | Status: DC

## 2018-11-05 NOTE — Patient Instructions (Addendum)
Description   Continue taking Coumadin 1 tablet (2mg ) daily except 1/2 tablet (1mg ) on Mondays and Thursdays.  Recheck in 4 weeks. Call [phone] with upcoming procedure date once scheduled.

## 2018-11-19 ENCOUNTER — Other Ambulatory Visit: Payer: Self-pay | Admitting: Family Medicine

## 2018-11-20 ENCOUNTER — Telehealth: Payer: Self-pay | Admitting: Family Medicine

## 2018-11-20 NOTE — Telephone Encounter (Signed)
Called patient to discuss nature of her upcoming visit to see if it is something that may be addressed over the phone.  She and daughter stated she is feeling well and her appt was for a Pap only.  Offered the option of postponing this visit to 1-2 months from now given current coronavirus epidemic would want to limit visits to only those which are essential and acute.  They were appreciative of this and daughter will be calling to reschedule once she takes a look at her calendar.

## 2018-11-23 ENCOUNTER — Telehealth: Payer: Self-pay | Admitting: Family Medicine

## 2018-11-23 MED ORDER — VALACYCLOVIR HCL 1 G PO TABS: 1000.0000 mg | ORAL_TABLET | Freq: Three times a day (TID) | ORAL | 0 refills | Status: AC

## 2018-11-23 NOTE — Telephone Encounter (Signed)
**  After Hours/ Emergency Line Call**  Received a call from patient's daughter to report that Deborah [name] developed a painful, slightly pruritic rash yesterday on her L abdomen above her waistline.  Rash is described as blistering and "accumulating together."  Prior to the rash she began having pain on Thursday night.  Daughter thinks her mom may have had chickenpox as a child, has never had shingles.  Is not sure if she has had the shingles vaccine.  Daughter denies any purulence or bleeding from the rash.  Denies any rashes anywhere else on her body.  No one else at home has a rash.  Denies new soaps or detergents.  No pets in the home.  Has been taking Tylenol for pain and topical Benadryl cream for itching which helps a little.  Daughter endorses one subjective fever last night but did not take her temperature, the patient took Tylenol for this.  Patient's daughter texted picture of rash (see below).  [When zoomed into the picture, small blisters are apparent.]  Picture and history consistent with shingles, will prescribe valacyclovir to patient's pharmacy. Will forward to PCP.  Ellwood Dense, DO PGY-2,  Family Medicine 11/23/2018 11:35 AM

## 2018-11-25 ENCOUNTER — Ambulatory Visit: Payer: Medicare Other | Admitting: Family Medicine

## 2018-12-01 ENCOUNTER — Telehealth: Payer: Self-pay | Admitting: Family Medicine

## 2018-12-01 NOTE — Telephone Encounter (Signed)
Received an after hours page from patient's daughter regarding abdominal swelling.  On Monday, March 30, patient was thought to have shingles and was prescribed Valtrex and benadryl, which she has been taking as prescribed.  Her rash has slowly improved, and her associated pain has decreased.  Her rash is pathognomonic for shingles since it is dermatomal and associated with pain, extending from her spine to mid-abdomen on the left T6/T7 region.  Patient's daughter is concerned, however, that patient has had accompanying swelling that seems to be deep to skin.  The swelling is about in the same region as her rash but extends about three inches more toward the midline under her L breast.  The swollen area is not erythematous and her daughter is unsure if it has spread or not.  She has some numbness in the area and has some discomfort in the region of the swelling, but the swelling itself is not tender to palpation.  She has no fevers and normal appetite, but she does feel fatigued.  Her daughter called today because the swelling has not improved with Valtrex.  This swelling may be due to inflammation associated with her shingles, but I am concerned that her swelling could be infectious in origin (either cellulitis or abscess).  I have made an appointment for her in ATC tomorrow at 11:15AM.  Patient and daughter were agreeable to this plan.

## 2018-12-02 ENCOUNTER — Other Ambulatory Visit: Payer: Self-pay

## 2018-12-02 ENCOUNTER — Ambulatory Visit (INDEPENDENT_AMBULATORY_CARE_PROVIDER_SITE_OTHER): Payer: Medicare Other | Admitting: Pharmacist

## 2018-12-02 ENCOUNTER — Telehealth: Payer: Self-pay

## 2018-12-02 ENCOUNTER — Ambulatory Visit: Payer: Medicare Other

## 2018-12-02 DIAGNOSIS — Z5181 Encounter for therapeutic drug level monitoring: Secondary | ICD-10-CM | POA: Diagnosis not present

## 2018-12-02 DIAGNOSIS — Z7901 Long term (current) use of anticoagulants: Secondary | ICD-10-CM

## 2018-12-02 DIAGNOSIS — I059 Rheumatic mitral valve disease, unspecified: Secondary | ICD-10-CM | POA: Diagnosis not present

## 2018-12-02 LAB — POCT INR: INR: 4.3 — AB (ref 2.0–3.0)

## 2018-12-02 NOTE — Telephone Encounter (Signed)

## 2018-12-03 ENCOUNTER — Telehealth: Payer: Self-pay | Admitting: Family Medicine

## 2018-12-03 NOTE — Telephone Encounter (Signed)
Patients daughter called for her to possibly get an ultrasound per [place], because of stomach swelling. Please give patient a call back. Request first appointment in morning or late afternoon for no explosure. Patients daughters Tina's number is [phone].

## 2018-12-03 NOTE — Telephone Encounter (Signed)
After chart review, looks like the patient was supposed to have come in for ATC apt yesterday, however doesn't look like she did. She needs to be seen correct?

## 2018-12-04 NOTE — Telephone Encounter (Signed)
Yes I think it would be beneficial for her to be seen.  I mentioned to her daughter over the phone that she may need an ultrasound but that the person evaluating her in clinic would be better at determining the need for that.  I made her an appointment but it looks like it was canceled.  Would you contact her daughter at [phone] to see if we can make a new appointment for her?  Thank you!

## 2018-12-04 NOTE — Telephone Encounter (Signed)
Scheduled for ATC on Monday.

## 2018-12-08 ENCOUNTER — Ambulatory Visit: Payer: Medicare Other

## 2018-12-19 ENCOUNTER — Telehealth: Payer: Self-pay

## 2018-12-19 NOTE — Telephone Encounter (Signed)

## 2018-12-23 ENCOUNTER — Ambulatory Visit (INDEPENDENT_AMBULATORY_CARE_PROVIDER_SITE_OTHER): Payer: Medicare Other | Admitting: *Deleted

## 2018-12-23 ENCOUNTER — Other Ambulatory Visit: Payer: Self-pay

## 2018-12-23 DIAGNOSIS — Z5181 Encounter for therapeutic drug level monitoring: Secondary | ICD-10-CM | POA: Diagnosis not present

## 2018-12-23 DIAGNOSIS — I059 Rheumatic mitral valve disease, unspecified: Secondary | ICD-10-CM

## 2018-12-23 DIAGNOSIS — Z7901 Long term (current) use of anticoagulants: Secondary | ICD-10-CM

## 2018-12-23 LAB — POCT INR: INR: 3.2 — AB (ref 2.0–3.0)

## 2019-01-16 ENCOUNTER — Telehealth: Payer: Self-pay

## 2019-01-16 NOTE — Telephone Encounter (Signed)

## 2019-01-19 ENCOUNTER — Other Ambulatory Visit (HOSPITAL_COMMUNITY): Payer: Self-pay | Admitting: Internal Medicine

## 2019-01-20 ENCOUNTER — Other Ambulatory Visit: Payer: Self-pay

## 2019-01-20 ENCOUNTER — Ambulatory Visit (INDEPENDENT_AMBULATORY_CARE_PROVIDER_SITE_OTHER): Payer: Medicare Other | Admitting: Pharmacist

## 2019-01-20 DIAGNOSIS — Z7901 Long term (current) use of anticoagulants: Secondary | ICD-10-CM

## 2019-01-20 DIAGNOSIS — Z5181 Encounter for therapeutic drug level monitoring: Secondary | ICD-10-CM | POA: Diagnosis not present

## 2019-01-20 DIAGNOSIS — I059 Rheumatic mitral valve disease, unspecified: Secondary | ICD-10-CM

## 2019-01-20 LAB — POCT INR: INR: 4.9 — AB (ref 2.0–3.0)

## 2019-01-21 NOTE — Telephone Encounter (Signed)
Ok to refill? Please advise.  

## 2019-01-27 ENCOUNTER — Telehealth: Payer: Self-pay

## 2019-01-27 ENCOUNTER — Other Ambulatory Visit: Payer: Self-pay

## 2019-01-27 NOTE — Telephone Encounter (Signed)

## 2019-01-28 MED ORDER — OMEPRAZOLE 40 MG PO CPDR: DELAYED_RELEASE_CAPSULE | ORAL | 0 refills | Status: DC

## 2019-01-29 ENCOUNTER — Other Ambulatory Visit: Payer: Self-pay | Admitting: Family Medicine

## 2019-01-30 ENCOUNTER — Other Ambulatory Visit: Payer: Self-pay

## 2019-01-30 ENCOUNTER — Ambulatory Visit (INDEPENDENT_AMBULATORY_CARE_PROVIDER_SITE_OTHER): Payer: Medicare Other | Admitting: *Deleted

## 2019-01-30 DIAGNOSIS — Z5181 Encounter for therapeutic drug level monitoring: Secondary | ICD-10-CM | POA: Diagnosis not present

## 2019-01-30 DIAGNOSIS — Z7901 Long term (current) use of anticoagulants: Secondary | ICD-10-CM

## 2019-01-30 DIAGNOSIS — I059 Rheumatic mitral valve disease, unspecified: Secondary | ICD-10-CM

## 2019-01-30 LAB — POCT INR: INR: 3.9 — AB (ref 2.0–3.0)

## 2019-01-30 NOTE — Patient Instructions (Addendum)
Description   instructed pt to hold dose today then change dose to 1 tablet (2mg ) daily except 1/2 tablet (1mg ) on Mondays,Thursdays and Saturdays. Recheck in 2 weeks. Call [phone] with any medication changes or up coming procedures.   Pt said she should be having a colonoscopy at the end of July, has no official date yet. Told her to call us when she is aware of procedure date.

## 2019-02-09 ENCOUNTER — Telehealth: Payer: Self-pay

## 2019-02-09 NOTE — Telephone Encounter (Signed)
Pt listed the pharmacy number as their home number unable to lmom for prescreen

## 2019-02-12 ENCOUNTER — Ambulatory Visit (INDEPENDENT_AMBULATORY_CARE_PROVIDER_SITE_OTHER): Payer: Medicare Other | Admitting: *Deleted

## 2019-02-12 ENCOUNTER — Other Ambulatory Visit: Payer: Self-pay

## 2019-02-12 DIAGNOSIS — Z7901 Long term (current) use of anticoagulants: Secondary | ICD-10-CM

## 2019-02-12 DIAGNOSIS — I059 Rheumatic mitral valve disease, unspecified: Secondary | ICD-10-CM | POA: Diagnosis not present

## 2019-02-12 DIAGNOSIS — Z5181 Encounter for therapeutic drug level monitoring: Secondary | ICD-10-CM

## 2019-02-12 LAB — POCT INR: INR: 3.8 — AB (ref 2.0–3.0)

## 2019-02-12 NOTE — Patient Instructions (Signed)
Description    Hold dose today then change dose to half a tablet daily except for a whole tablet on Sunday, Tuesday and Friday. Recheck in 2 weeks. Call [phone] with any medication changes or up coming procedures.

## 2019-02-23 ENCOUNTER — Telehealth: Payer: Self-pay

## 2019-02-23 NOTE — Telephone Encounter (Signed)
Unable to lmom for prescreen  

## 2019-03-02 ENCOUNTER — Ambulatory Visit (INDEPENDENT_AMBULATORY_CARE_PROVIDER_SITE_OTHER): Payer: Medicare Other | Admitting: *Deleted

## 2019-03-02 ENCOUNTER — Other Ambulatory Visit: Payer: Self-pay

## 2019-03-02 DIAGNOSIS — Z7901 Long term (current) use of anticoagulants: Secondary | ICD-10-CM

## 2019-03-02 DIAGNOSIS — I059 Rheumatic mitral valve disease, unspecified: Secondary | ICD-10-CM | POA: Diagnosis not present

## 2019-03-02 DIAGNOSIS — Z5181 Encounter for therapeutic drug level monitoring: Secondary | ICD-10-CM | POA: Diagnosis not present

## 2019-03-02 LAB — POCT INR: INR: 3 (ref 2.0–3.0)

## 2019-03-02 NOTE — Patient Instructions (Signed)
Description   Continue taking 1/2 tablet daily except for a 1 tablet on Sunday, Tuesday and Friday. Recheck in 3 weeks. Call [phone] with any medication changes or up coming procedures.

## 2019-03-03 ENCOUNTER — Other Ambulatory Visit (HOSPITAL_COMMUNITY): Payer: Self-pay | Admitting: Cardiology

## 2019-03-19 ENCOUNTER — Telehealth: Payer: Self-pay | Admitting: *Deleted

## 2019-03-19 NOTE — Telephone Encounter (Signed)

## 2019-03-20 ENCOUNTER — Other Ambulatory Visit: Payer: Self-pay | Admitting: Family Medicine

## 2019-03-20 ENCOUNTER — Other Ambulatory Visit (HOSPITAL_COMMUNITY): Payer: Self-pay | Admitting: Internal Medicine

## 2019-03-23 ENCOUNTER — Ambulatory Visit (INDEPENDENT_AMBULATORY_CARE_PROVIDER_SITE_OTHER): Payer: Medicare Other | Admitting: *Deleted

## 2019-03-23 ENCOUNTER — Other Ambulatory Visit: Payer: Self-pay

## 2019-03-23 DIAGNOSIS — Z7901 Long term (current) use of anticoagulants: Secondary | ICD-10-CM

## 2019-03-23 DIAGNOSIS — I059 Rheumatic mitral valve disease, unspecified: Secondary | ICD-10-CM | POA: Diagnosis not present

## 2019-03-23 DIAGNOSIS — Z5181 Encounter for therapeutic drug level monitoring: Secondary | ICD-10-CM

## 2019-03-23 LAB — POCT INR: INR: 3.2 — AB (ref 2.0–3.0)

## 2019-03-23 NOTE — Patient Instructions (Signed)
Description   Continue taking 1/2 tablet daily except for a 1 tablet on Sunday, Tuesday and Friday. Recheck in 4 weeks. Call [phone] with any medication changes or up coming procedures.

## 2019-03-24 ENCOUNTER — Other Ambulatory Visit (HOSPITAL_COMMUNITY): Payer: Self-pay | Admitting: Cardiology

## 2019-03-24 ENCOUNTER — Other Ambulatory Visit (HOSPITAL_COMMUNITY): Payer: Self-pay | Admitting: *Deleted

## 2019-03-24 NOTE — Telephone Encounter (Signed)
Pt is overdue for an appointment with Dr. Aundra Dubin. Called and spoke with pt's daughter and explained to keep refilling Coumadin pt needs to be followed by one of our cardiologist. Pt's daughter stated they have been meaning to schedule an appointment. Transferred pt's daughter to schedule an appointment with Dr. Aundra Dubin. Will give 1 month supply of Coumadin.

## 2019-03-26 ENCOUNTER — Other Ambulatory Visit (HOSPITAL_COMMUNITY): Payer: Self-pay | Admitting: Cardiology

## 2019-03-30 ENCOUNTER — Other Ambulatory Visit: Payer: Self-pay

## 2019-03-30 ENCOUNTER — Other Ambulatory Visit: Payer: Medicare Other

## 2019-03-30 DIAGNOSIS — D582 Other hemoglobinopathies: Secondary | ICD-10-CM

## 2019-04-01 ENCOUNTER — Other Ambulatory Visit: Payer: Self-pay | Admitting: Student in an Organized Health Care Education/Training Program

## 2019-04-01 DIAGNOSIS — R0602 Shortness of breath: Secondary | ICD-10-CM

## 2019-04-01 DIAGNOSIS — I081 Rheumatic disorders of both mitral and tricuspid valves: Secondary | ICD-10-CM

## 2019-04-01 NOTE — Progress Notes (Signed)
[facility] requested labs. Pro BNP and NT-proBNP. Orders placed for future clinic collect per request. For SOB, and disorders of both mitral and tricuspid valves.

## 2019-04-02 ENCOUNTER — Telehealth: Payer: Self-pay

## 2019-04-02 LAB — RETICULOCYTES: Retic Ct Pct: 2.8 % — ABNORMAL HIGH (ref 0.6–2.6)

## 2019-04-02 LAB — SPECIMEN STATUS REPORT

## 2019-04-02 LAB — LACTATE DEHYDROGENASE: LDH: 317 IU/L — ABNORMAL HIGH (ref 119–226)

## 2019-04-02 NOTE — Telephone Encounter (Signed)
LVM for patient to schedule Lab visit.  Deborah [name], [place]

## 2019-04-02 NOTE — Telephone Encounter (Signed)
Daughter called back.  Advised labs need to be drawn.  She states that she just brought her Monday.   I see where she had an appt but I dont see that labs were collected.  Will forward to lab to advised. Christen Bame, CMA

## 2019-04-03 ENCOUNTER — Other Ambulatory Visit: Payer: Self-pay | Admitting: Student in an Organized Health Care Education/Training Program

## 2019-04-03 ENCOUNTER — Telehealth: Payer: Self-pay | Admitting: Student in an Organized Health Care Education/Training Program

## 2019-04-03 DIAGNOSIS — R0602 Shortness of breath: Secondary | ICD-10-CM

## 2019-04-03 NOTE — Telephone Encounter (Signed)
Patient has been calling us since Wednesday leaving voicemails to get lab results from earlier this week but no one has called her back.  She had to drive here to get the results but they are not in computer.

## 2019-04-07 ENCOUNTER — Other Ambulatory Visit: Payer: Self-pay

## 2019-04-07 ENCOUNTER — Inpatient Hospital Stay (HOSPITAL_COMMUNITY)
Admission: EM | Admit: 2019-04-07 | Discharge: 2019-04-09 | DRG: 292 | Disposition: A | Discharge status: Home / Self Care | Payer: Medicare Other | Attending: Family Medicine | Admitting: Family Medicine

## 2019-04-07 ENCOUNTER — Emergency Department (HOSPITAL_COMMUNITY): Payer: Medicare Other

## 2019-04-07 ENCOUNTER — Encounter (HOSPITAL_COMMUNITY): Payer: Self-pay | Admitting: *Deleted

## 2019-04-07 DIAGNOSIS — M25561 Pain in right knee: Secondary | ICD-10-CM | POA: Diagnosis present

## 2019-04-07 DIAGNOSIS — E1169 Type 2 diabetes mellitus with other specified complication: Secondary | ICD-10-CM | POA: Diagnosis present

## 2019-04-07 DIAGNOSIS — M25572 Pain in left ankle and joints of left foot: Secondary | ICD-10-CM | POA: Diagnosis present

## 2019-04-07 DIAGNOSIS — Z952 Presence of prosthetic heart valve: Secondary | ICD-10-CM

## 2019-04-07 DIAGNOSIS — Z833 Family history of diabetes mellitus: Secondary | ICD-10-CM

## 2019-04-07 DIAGNOSIS — J8410 Pulmonary fibrosis, unspecified: Secondary | ICD-10-CM | POA: Diagnosis present

## 2019-04-07 DIAGNOSIS — E1122 Type 2 diabetes mellitus with diabetic chronic kidney disease: Secondary | ICD-10-CM | POA: Diagnosis present

## 2019-04-07 DIAGNOSIS — Z7984 Long term (current) use of oral hypoglycemic drugs: Secondary | ICD-10-CM

## 2019-04-07 DIAGNOSIS — N183 Chronic kidney disease, stage 3 (moderate): Secondary | ICD-10-CM | POA: Diagnosis present

## 2019-04-07 DIAGNOSIS — M109 Gout, unspecified: Secondary | ICD-10-CM | POA: Diagnosis present

## 2019-04-07 DIAGNOSIS — K760 Fatty (change of) liver, not elsewhere classified: Secondary | ICD-10-CM | POA: Diagnosis present

## 2019-04-07 DIAGNOSIS — I482 Chronic atrial fibrillation, unspecified: Secondary | ICD-10-CM | POA: Diagnosis present

## 2019-04-07 DIAGNOSIS — Z888 Allergy status to other drugs, medicaments and biological substances status: Secondary | ICD-10-CM

## 2019-04-07 DIAGNOSIS — R072 Precordial pain: Secondary | ICD-10-CM

## 2019-04-07 DIAGNOSIS — Z79899 Other long term (current) drug therapy: Secondary | ICD-10-CM

## 2019-04-07 DIAGNOSIS — G8929 Other chronic pain: Secondary | ICD-10-CM | POA: Diagnosis present

## 2019-04-07 DIAGNOSIS — Z7901 Long term (current) use of anticoagulants: Secondary | ICD-10-CM

## 2019-04-07 DIAGNOSIS — I25110 Atherosclerotic heart disease of native coronary artery with unstable angina pectoris: Secondary | ICD-10-CM | POA: Diagnosis present

## 2019-04-07 DIAGNOSIS — R079 Chest pain, unspecified: Secondary | ICD-10-CM

## 2019-04-07 DIAGNOSIS — I970 Postcardiotomy syndrome: Secondary | ICD-10-CM | POA: Diagnosis present

## 2019-04-07 DIAGNOSIS — D560 Alpha thalassemia: Secondary | ICD-10-CM | POA: Diagnosis present

## 2019-04-07 DIAGNOSIS — I130 Hypertensive heart and chronic kidney disease with heart failure and stage 1 through stage 4 chronic kidney disease, or unspecified chronic kidney disease: Principal | ICD-10-CM | POA: Diagnosis present

## 2019-04-07 DIAGNOSIS — K219 Gastro-esophageal reflux disease without esophagitis: Secondary | ICD-10-CM | POA: Diagnosis present

## 2019-04-07 DIAGNOSIS — E785 Hyperlipidemia, unspecified: Secondary | ICD-10-CM | POA: Diagnosis present

## 2019-04-07 DIAGNOSIS — F419 Anxiety disorder, unspecified: Secondary | ICD-10-CM | POA: Diagnosis present

## 2019-04-07 DIAGNOSIS — D509 Iron deficiency anemia, unspecified: Secondary | ICD-10-CM | POA: Diagnosis present

## 2019-04-07 DIAGNOSIS — Z79891 Long term (current) use of opiate analgesic: Secondary | ICD-10-CM

## 2019-04-07 DIAGNOSIS — M25562 Pain in left knee: Secondary | ICD-10-CM | POA: Diagnosis present

## 2019-04-07 DIAGNOSIS — Z7982 Long term (current) use of aspirin: Secondary | ICD-10-CM

## 2019-04-07 DIAGNOSIS — Z20828 Contact with and (suspected) exposure to other viral communicable diseases: Secondary | ICD-10-CM | POA: Diagnosis present

## 2019-04-07 DIAGNOSIS — M25571 Pain in right ankle and joints of right foot: Secondary | ICD-10-CM | POA: Diagnosis present

## 2019-04-07 DIAGNOSIS — Z8249 Family history of ischemic heart disease and other diseases of the circulatory system: Secondary | ICD-10-CM

## 2019-04-07 DIAGNOSIS — I5032 Chronic diastolic (congestive) heart failure: Secondary | ICD-10-CM | POA: Diagnosis present

## 2019-04-07 DIAGNOSIS — E1165 Type 2 diabetes mellitus with hyperglycemia: Secondary | ICD-10-CM | POA: Diagnosis present

## 2019-04-07 DIAGNOSIS — I200 Unstable angina: Secondary | ICD-10-CM

## 2019-04-07 LAB — BASIC METABOLIC PANEL
Anion gap: 10 (ref 5–15)
BUN: 18 mg/dL (ref 8–23)
CO2: 25 mmol/L (ref 22–32)
Calcium: 9 mg/dL (ref 8.9–10.3)
Chloride: 104 mmol/L (ref 98–111)
Creatinine, Ser: 1.29 mg/dL — ABNORMAL HIGH (ref 0.44–1.00)
GFR calc Af Amer: 50 mL/min — ABNORMAL LOW (ref >60)
GFR calc non Af Amer: 43 mL/min — ABNORMAL LOW (ref >60)
Glucose, Bld: 256 mg/dL — ABNORMAL HIGH (ref 70–99)
Potassium: 4.1 mmol/L (ref 3.5–5.1)
Sodium: 139 mmol/L (ref 135–145)

## 2019-04-07 LAB — CBC
HCT: 35.1 % — ABNORMAL LOW (ref 36.0–46.0)
Hemoglobin: 10.8 g/dL — ABNORMAL LOW (ref 12.0–15.0)
MCH: 23.7 pg — ABNORMAL LOW (ref 26.0–34.0)
MCHC: 30.8 g/dL (ref 30.0–36.0)
MCV: 77.1 fL — ABNORMAL LOW (ref 80.0–100.0)
Platelets: 175 10*3/uL (ref 150–400)
RBC: 4.55 MIL/uL (ref 3.87–5.11)
RDW: 15.7 % — ABNORMAL HIGH (ref 11.5–15.5)
WBC: 7.5 10*3/uL (ref 4.0–10.5)
nRBC: 0 % (ref 0.0–0.2)

## 2019-04-07 LAB — PROTIME-INR
INR: 2.5 — ABNORMAL HIGH (ref 0.8–1.2)
Prothrombin Time: 26.9 s — ABNORMAL HIGH (ref 11.4–15.2)

## 2019-04-07 LAB — TROPONIN I (HIGH SENSITIVITY): Troponin I (High Sensitivity): 6 ng/L

## 2019-04-07 MED ORDER — PROMETHAZINE HCL 25 MG PO TABS: 12.5000 mg | ORAL_TABLET | Freq: Once | ORAL | Status: DC

## 2019-04-07 MED ORDER — ALUM & MAG HYDROXIDE-SIMETH 200-200-20 MG/5ML PO SUSP
15.0000 mL | Freq: Once | ORAL | Status: AC
  Administered 2019-04-07: 15 mL via ORAL
  Filled 2019-04-07: qty 30

## 2019-04-07 MED ORDER — ACCU-CHEK AVIVA PLUS VI STRP: ORAL_STRIP | 12 refills | Status: DC

## 2019-04-07 MED ORDER — MORPHINE SULFATE (PF) 4 MG/ML IV SOLN
4.0000 mg | Freq: Once | INTRAVENOUS | Status: AC | PRN
  Administered 2019-04-07: 4 mg via INTRAVENOUS
  Filled 2019-04-07: qty 1

## 2019-04-07 MED ORDER — SODIUM CHLORIDE 0.9% FLUSH: 3.0000 mL | Freq: Once | INTRAVENOUS | Status: DC

## 2019-04-07 NOTE — ED Triage Notes (Signed)
Pt was eating dinner tonight and started having centralized chest pain, took one nitro without relief. Enroute to the hospital, pt started having tightness down her L arm, took a second nitro without relief. Pt reports sob and feels  Like she's gasping for air.

## 2019-04-07 NOTE — ED Provider Notes (Signed)
Emergency Department Provider Note   I have reviewed the triage vital signs and the nursing notes.   HISTORY  Chief Complaint Chest Pain   HPI Deborah [name] is a 68 y.o. female with past medical history of CAD and chronic A. fib on Coumadin presents to the emergency department after experiencing acute onset left chest discomfort.  She was eating at the time and describes the pain as tightness in the left chest radiating to the left shoulder.  She took a nitroglycerin with no relief.  Her daughter drove her to the emergency department and gave an additional nitroglycerin in the car with no significant improvement.  Patient continues to have chest discomfort.  She does report some shortness of breath.  She has been compliant with her medications at home.   Offered video interpreter but patient would prefer to use family at bedside.   Past Medical History:  Diagnosis Date  . Allergic rhinitis   . Anemia   . Borderline diabetes   . Carpal tunnel syndrome   . Chronic atrial fibrillation    coumadin managed by [facility]. CVRR;  Event montor (7/12-8/12) showed no high rate episodes (patient continuously in atrial fibrillation).   . Chronic diastolic heart failure ([place])   . Eosinophilia 01/27/2013  . External hemorrhoid   . External hemorrhoids   . Fatigue   . Gout   . 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 Lamonte Sakai  . Hx of cardiovascular stress test    ETT-myoview (7/12): No evidence for ischemia or infarction  . Hyperlipidemia   . Hypertension   . Obesity   . 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 Rotator cuff  syndrome     Patient Active Problem List   Diagnosis Date Noted  . Cough 07/11/2018  . Chronic pain of both ankles 09/30/2017  . Pain in joint involving ankle and foot 09/16/2017  . NAFLD (nonalcoholic fatty liver disease) 06/03/2017  . Knee pain, bilateral 04/12/2017  . Low back pain without sciatica 06/12/2016  . Nonspecific chest pain   . Vision changes 02/21/2016  . RUQ pain 03/24/2015  . 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 Encounter for therapeutic drug monitoring 09/30/2013  . 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 H/O prosthetic heart valve 07/08/2013  . Eosinophilia 01/27/2013  . Microcytic anemia 05/01/2012  . 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 Postprocedural state 05/22/2011  . Diastolic CHF, chronic ([place]) 03/15/2011  . Rheumatic heart disease   . Chronic atrial fibrillation ([place])   . Chronic anticoagulation   . DM (diabetes mellitus) ([place])   . Hyperlipidemia   . Mitral valve disorder 11/16/2010  . GOUT 11/29/2009  . TRICUSPID REGURGITATION, MODERATE WITH MILD PULMONARY HTN 11/29/2009  . HYPERBILIRUBINEMIA 06/22/2009  . HYPERGLYCEMIA 06/21/2009  . Hyperlipidemia associated with type 2 diabetes mellitus ([place]) 01/07/2009  . GERD 01/07/2009  . CARPAL TUNNEL SYNDROME, LEFT 11/11/2008    Past Surgical History:  Procedure Laterality Date  . CARDIAC CATHETERIZATION     EF 55%  . TRICUSPID VALVE REPAIR  Allergies Promethazine hcl  Family History  Problem Relation Age of Onset  . Stroke Mother   . Diabetes Mother   . Heart failure Mother        pacemaker  . Heart disease Father        "heart problems"  . Heart disease Sister   . Heart disease Brother   . Gout Brother   . Diabetes Brother   . Stroke Brother   . Valvular heart disease Sister     Social History Social History   Tobacco Use  . Smoking status: Never Smoker  . Smokeless tobacco: Never Used  Substance Use Topics  . Alcohol use: No  . Drug use: No     Review of Systems  Constitutional: No fever/chills Eyes: No visual changes. ENT: No sore throat. Cardiovascular: Positive chest pain. Respiratory: Denies shortness of breath. Gastrointestinal: No abdominal pain.  No nausea, no vomiting.  No diarrhea.  No constipation. Genitourinary: Negative for dysuria. Musculoskeletal: Negative for back pain. Skin: Negative for rash. Neurological: Negative for headaches, focal weakness or numbness.  10-point ROS otherwise negative.  ____________________________________________   PHYSICAL EXAM:  VITAL SIGNS: ED Triage Vitals  Enc Vitals Group     BP 04/07/19 2117 (!) 158/70     Pulse Rate 04/07/19 2117 74     Resp 04/07/19 2117 (!) 22     Temp 04/07/19 2117 97.7 F (36.5 C)     Temp Source 04/07/19 2117 Oral     SpO2 04/07/19 2117 99 %   Constitutional: Alert and oriented. Well appearing and in no acute distress. Eyes: Conjunctivae are normal.  Head: Atraumatic. Nose: No congestion/rhinnorhea. Mouth/Throat: Mucous membranes are moist.  Neck: No stridor.  Cardiovascular: Normal rate, regular rhythm. Good peripheral circulation. Grossly normal heart sounds.   Respiratory: Normal respiratory effort.  No retractions. Lungs CTAB. Gastrointestinal: Soft and nontender. No distention.  Musculoskeletal: No gross deformities of extremities. Neurologic:  Normal speech and language.  Skin:  Skin is warm, dry and intact. No rash noted.  ____________________________________________   LABS (all labs ordered are listed, but only abnormal results are displayed)  Labs Reviewed  BASIC METABOLIC PANEL - Abnormal; Notable for the following components:      Result Value   Glucose, Bld 256 (*)    Creatinine, Ser 1.29 (*)    GFR calc non Af Amer 43 (*)    GFR calc Af Amer 50 (*)    All other components within normal limits  CBC - Abnormal; Notable for the following components:   Hemoglobin 10.8 (*)    HCT 35.1 (*)    MCV 77.1 (*)    MCH  23.7 (*)    RDW 15.7 (*)    All other components within normal limits  PROTIME-INR - Abnormal; Notable for the following components:   Prothrombin Time 26.9 (*)    INR 2.5 (*)    All other components within normal limits  BRAIN NATRIURETIC PEPTIDE - Abnormal; Notable for the following components:   B Natriuretic Peptide 144.4 (*)    All other components within normal limits  HEPATIC FUNCTION PANEL - Abnormal; Notable for the following components:   Total Bilirubin 1.4 (*)    Indirect Bilirubin 1.2 (*)    All other components within normal limits  BASIC METABOLIC PANEL - Abnormal; Notable for the following components:   Glucose, Bld 221 (*)    Creatinine, Ser 1.21 (*)    GFR calc non Af Amer 47 (*)  GFR calc Af Amer 54 (*)    All other components within normal limits  CBC - Abnormal; Notable for the following components:   Hemoglobin 10.5 (*)    HCT 33.6 (*)    MCV 76.5 (*)    MCH 23.9 (*)    All other components within normal limits  SARS CORONAVIRUS 2 (HOSPITAL ORDER, PERFORMED IN [facility])  IRON AND TIBC  FERRITIN  LIPASE, BLOOD  HIV ANTIBODY (ROUTINE TESTING W REFLEX)  LIPID PANEL  HEMOGLOBIN A1C  TROPONIN I (HIGH SENSITIVITY)  TROPONIN I (HIGH SENSITIVITY)   ____________________________________________  EKG   EKG Interpretation  Date/Time:  Tuesday April 07 2019 21:53:04 EDT Ventricular Rate:  80 PR Interval:    QRS Duration: 100 QT Interval:  449 QTC Calculation: 518 R Axis:   107 Text Interpretation:  Atrial fibrillation Right axis deviation Prolonged QT interval No acute changes Confirmed by Gilda CreasePollina, Christopher J [phone]([id number]) on 04/08/2019 5:13:35 AM       ____________________________________________  RADIOLOGY  Dg Chest 2 View  Result Date: 04/07/2019 CLINICAL DATA:  Chest pain EXAM: CHEST - 2 VIEW COMPARISON:  07/15/2018 FINDINGS: Cardiac shadow is mildly enlarged. Postsurgical changes are noted. Blunting of the left costophrenic  angle is noted stable from the prior exam. No focal infiltrate or sizable effusion is seen. Valvular replacement is noted. IMPRESSION: No acute abnormality noted. Electronically Signed   By: Alcide CleverMark  Lukens M.D.   On: 04/07/2019 21:43    ____________________________________________   PROCEDURES  Procedure(s) performed:   Procedures  None ____________________________________________   INITIAL IMPRESSION / ASSESSMENT AND PLAN / ED COURSE  Pertinent labs & imaging results that were available during my care of the patient were reviewed by me and considered in my medical decision making (see chart for details).   Patient presents to the emergency department for evaluation of chest pain.  No relief with nitroglycerin.  Pain pattern and description are concerning for ACS.  EKG shows atrial fibrillation.  Chest x-ray reviewed with no acute findings.  [facility].   Labs unremarkable. Plan for admit.   Discussed patient's case with Family Medicine to request admission. Patient and family (if present) updated with plan. [facility].  I reviewed all nursing notes, vitals, pertinent old records, EKGs, labs, imaging (as available).  ____________________________________________  FINAL CLINICAL IMPRESSION(S) / ED DIAGNOSES  Final diagnoses:  Precordial chest pain     MEDICATIONS GIVEN DURING THIS VISIT:  Medications  sodium chloride flush (NS) 0.9 % injection 3 mL (3 mLs Intravenous Not Given 04/07/19 2352)  alum & mag hydroxide-simeth (MAALOX/MYLANTA) 200-200-20 MG/5ML suspension 30 mL (0 mLs Oral Hold 04/08/19 0239)  acetaminophen (TYLENOL) tablet 500-1,000 mg (has no administration in time range)  allopurinol (ZYLOPRIM) tablet 300 mg (has no administration in time range)  aspirin chewable tablet 81 mg (has no administration in time range)  isosorbide mononitrate (IMDUR) 24 hr tablet 15 mg (has no administration in time range)  metoprolol  succinate (TOPROL-XL) 24 hr tablet 50 mg (has no administration in time range)  nitroGLYCERIN (NITROSTAT) SL tablet 0.4 mg (has no administration in time range)  simvastatin (ZOCOR) tablet 10 mg (has no administration in time range)  torsemide (DEMADEX) tablet 80 mg (has no administration in time range)  diazepam (VALIUM) tablet 5 mg (has no administration in time range)  warfarin (COUMADIN) tablet 1 mg (has no administration in time range)  benzonatate (TESSALON) capsule 100 mg (has no administration  in time range)  loratadine (CLARITIN) tablet 10 mg (has no administration in time range)  cycloSPORINE (RESTASIS) 0.05 % ophthalmic emulsion 1 drop (1 drop Both Eyes Not Given 04/08/19 0316)  diclofenac sodium (VOLTAREN) 1 % transdermal gel 2 g (has no administration in time range)  Olopatadine HCl 0.7 % SOLN 1 drop (has no administration in time range)  pantoprazole (PROTONIX) EC tablet 80 mg (has no administration in time range)  alum & mag hydroxide-simeth (MAALOX/MYLANTA) 200-200-20 MG/5ML suspension 15 mL (15 mLs Oral Given 04/07/19 2236)  morphine 4 MG/ML injection 4 mg (4 mg Intravenous Given 04/07/19 2238)  metoCLOPramide (REGLAN) injection 5 mg (5 mg Intravenous Given 04/08/19 0021)  furosemide (LASIX) injection 60 mg (60 mg Intravenous Given 04/08/19 0354)     Note:  This document was prepared using Dragon voice recognition software and may include unintentional dictation errors.  Alona BeneJoshua Davaun Quintela, MD Emergency Medicine    Aarav Burgett, Arlyss RepressJoshua G, MD 04/08/19 [phone]

## 2019-04-07 NOTE — H&P (Addendum)
Family Medicine Teaching [facility] Admission History and Physical Service Pager: [phone]  Patient name: Deborah [name] Medical record number: [id number] Date of birth: [birth year] Age: 68 y.o. Gender: female  Primary Care Provider: Leeroy Bock, DO Consultants:  Code Status: full Preferred Emergency Contact: Daughter, home: [phone], mobile: [phone]  Chief Complaint: chest pain   Assessment and Plan: Deborah [name] is a 68 y.o. female presenting with chest pain . PMH is significant for Afib, diastolic CHF, rheumatic heart disease s/p MV replacement and TV repair, HTN, HLD, Gout, GERD, diabetes, anxiety.   ACS rule out Patient has been admitted with sudden onset left-sided chest pain/epigastric pain at rest with radiation to left arm. No exacerbating factors and not alleviated by nitroglycerin on 2 occasions.  Physical exam reveal an ill-appearing woman vomiting multiple times during our interview, no reproducible chest pain, mild volume overload with rales on lung exam and elevated JVD.  Vitals are notable for mild hypertension up to 148/78.  High-sensitivity troponin: 6.  EKG showed A. fib with T wave inversion in III and flattening in V3-V6, old Q waves in lead III and aVF. No ST changes.  Chest x-ray shows mild blunting of the costophrenic angles.  Likely pt has cardiac related chest pain-perhaps unstable angina given pt has had history of angioplasty 18 yrs ago and heart score 5. PE considered but Well'sl score is 0 and pt is on warfarin so PE less likely. Considered GI cause of chest pain/epigastric pain. Pancreatitis considered however pt does have typical known risk factors ie gallstones, ETOH excess, trauma etc. Lipase pending. Hepatic causes should be considered given pt has a history of NASH and indirect bili 1.2. Biliary colic and cholecystitis considered given nature of pain radiating to shoulders, however pt does not have history of gallstones, afebrile and  non tender and no guarding on abdominal exam. Infective cause ie pneumonia considered however pt denies cough, is afebrile and CXR does not show changes indicating pneumonia. -Admit to tele, Dr Manson Passey -Telemetry -Trend troponins -EKG am  -Cardiology consult am -Follow lipase, LFTs, iron studies, BNP levels, HIV -Pain management with tylenol  -Anti emetic   Diastolic CHF-mild volume overload Last echo 03/19/2019: EF 55%, mild AR, trivial PR, moderate TR, mechanical MV valve, prosthetic TV ring. Known hx of rheumatic heart disease involving mitral and tricuspid valves.  Patient noted significant shortness of breath prior to coming into the hospital, auscultation of lungs reveals rales in the lower and middle fields. CXR showed bilateral blunting of costophrenic angles bilaterally.  BNP pending.  Home medication includes torsemide 80mg  morning and 20mg  at bedtime, Imdur 5 mg once daily, Metoprolol  50mg  once daily -f/u BNP -Given 40mg  IV Lasix one dose  -Continue same dose of torsemide overnight, but consider increasing -continue metoprolol and imdur   -Strict Is and Os  -Daily weights  Non alcoholic hepatic steatosis  LFTs pending, denies RUQ pain, no icterus  -continue to monitor -Consider Liver US if LFTs abnormal    Microcytic anemia Hb 10.8, baseline appears to be 11-12, MCV 77.  She currently denies vaginal bleeding, melena, hematochezia.  She follows regularly with GI for NASH.  Her last appointment note recommends upper and lower endoscopy on nonurgent basis.  Will be helpful to follow-up with GI for further work-up of this anemia.  She does not appear to require additional work-up for this in the hospital. -Transfusion threshold 8  -Iron studies  -Follow up with GI with  endoscopy and colonoscopy as outpatient as per previous note  Afib EKG shows rate controlled A. Fib, HR.  INR 2.5. Takes warfarin 1 mg daily -continue metoprolol  -Warfarin dosing per pharmacy  HLD Last Lipid  panel in 2019: Total cholesterol 182, HDL 57, LDL 86, T AG 195, VLDL 39 Takes Simvastan 10mg  once daily -continue statin   HTN BP 914/78, has high systolic BPs 295 to 621H See above for antihypertensives -continue to monitor BP  Diabetes Mellitus  CBG 168-194 Last HbA1c- 6.6 -sensitive sliding scale -Continue to monitor CBGs  Anxiety Takes diazepam 5 mg every 12hrs PRN -Continue diazepam   Gout Takes allopurinol 300mg  once daily -Continue allopurinol  GERD  -Protonix as per formulary   FEN/GI: Heart healthy, Protonix Prophylaxis: Lovenox  Disposition: 2 to 3 days of hospitalization  History of Present Illness:  Deborah [name] is a 68 y.o. female presenting with sudden onset chest pain.   Pt has generally felt well over last few days. Denies feeling stressed or anxious. While walking early in the day, she experienced left shoulder pain. Later in the evening at 8pm had sudden onset left-sided chest pain with some shooting pains down her left arm.  Constant, 8-9 out of 10 severity.  Patient took 1 nitro without significant pain relief.  Daughter was concerned so decided to drive her to the hospital.  On the drive to the hospital she took an additional nitro without significant relief of the chest pain.  She reports significant shortness of breath during the drive to the hospital and drove with the window down in order to provide some relief for her shortness of breath.  Denies exacerbating or alleviating factors.  Denies palpitations.  Has had some dizziness today but is known to have vertigo.  Pain currently 7 out of 10 which is improved slightly.  Denies fevers, chills, abdominal pain, urinary symptoms or change in bowel habits. Recently saw heart doctor about 2 weeks ago and everything appeared normal at that time.  In the ED, was given morphine after which patient experienced to episodes of coughing and nb/bn vomiting (she believes she may have had a vomiting reaction to  morphine many years ago).  Was given IV metoclopramide for emesis.  EKG showed A. fib with T wave inversion in V3-V6, old Q waves in lead III and aVF. No ST changes. High sensitivity troponin 6, glucose 256, Creat 1.29, BUN 18, Hb 10.8, INR 2.5, PT 26.7.  CXR showed possible blunting of costophrenic angles bilaterally.    Review Of Systems: Per HPI with the following additions:  Review of Systems  Constitutional: Positive for malaise/fatigue. Negative for fever.  Respiratory: Negative for cough, shortness of breath and wheezing.   Cardiovascular: Positive for chest pain. Negative for palpitations.  Gastrointestinal: Positive for nausea and vomiting. Negative for abdominal pain, constipation and diarrhea.  Genitourinary: Negative for dysuria, hematuria and urgency.    Patient Active Problem List   Diagnosis Date Noted  . Cough 07/11/2018  . Chronic pain of both ankles 09/30/2017  . Pain in joint involving ankle and foot 09/16/2017  . NAFLD (nonalcoholic fatty liver disease) 06/03/2017  . Knee pain, bilateral 04/12/2017  . Low back pain without sciatica 06/12/2016  . Nonspecific chest pain   . Vision changes 02/21/2016  . RUQ pain 03/24/2015  . 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 Encounter for therapeutic drug monitoring 09/30/2013  . 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 H/O prosthetic heart valve  07/08/2013  . Eosinophilia 01/27/2013  . Microcytic anemia 05/01/2012  . 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 Postprocedural state 05/22/2011  . Diastolic CHF, chronic (HCC) 03/15/2011  . Rheumatic heart disease   . Chronic atrial fibrillation (HCC)   . Chronic anticoagulation   . DM (diabetes mellitus) (HCC)   . Hyperlipidemia   . Mitral valve disorder 11/16/2010  . GOUT 11/29/2009  . TRICUSPID REGURGITATION, MODERATE WITH MILD PULMONARY HTN 11/29/2009  . HYPERBILIRUBINEMIA 06/22/2009  . HYPERGLYCEMIA 06/21/2009  . Hyperlipidemia associated with type 2  diabetes mellitus (HCC) 01/07/2009  . GERD 01/07/2009  . CARPAL TUNNEL SYNDROME, LEFT 11/11/2008    Past Medical History: Past Medical History:  Diagnosis Date  . Allergic rhinitis   . Anemia   . Borderline diabetes   . Carpal tunnel syndrome   . Chronic atrial fibrillation    [facility];  Event montor (7/12-8/12) showed no high rate episodes (patient continuously in atrial fibrillation).   . Chronic diastolic heart failure (HCC)   . Eosinophilia 01/27/2013  . External hemorrhoid   . External hemorrhoids   . Fatigue   . Gout   . 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 Gaylyn RongHa  . Hx of cardiovascular stress test    ETT-myoview (7/12): No evidence for ischemia or infarction  . Hyperlipidemia   . Hypertension   . Obesity   . Rheumatic heart disease    Status post mechanical ([employer]) mitral valve replacement and [facility]; echo 5/12:   EF 60-65%, mild AI, mitral valve prosthesis with AVA 1.66, severe LAE, moderate RAE, moderate to severe TR, mild increased pulmonary artery systolic pressure;    Adenosine Cardiolite in 4/09:   No ischemia, EF 66%  . Rotator cuff syndrome     Past Surgical History: Past Surgical History:  Procedure Laterality Date  . CARDIAC CATHETERIZATION     EF 55%  . TRICUSPID VALVE REPAIR      Social History: Social History   Tobacco Use  . Smoking status: Never Smoker  . Smokeless tobacco: Never Used  Substance Use Topics  . Alcohol use: No  . Drug use: No   Additional social history:  Lives with husband and daugher Denies ETOH or smoking Denies illicit drug use   Please also refer to relevant sections of EMR.  Family History: Family History  Problem Relation Age of Onset  . Stroke Mother   . Diabetes Mother   . Heart failure Mother        pacemaker  . Heart disease Father        "heart problems"  . Heart disease Sister   . Heart  disease Brother   . Gout Brother   . Diabetes Brother   . Stroke Brother   . Valvular heart disease Sister    (If not completed, MUST add something in)  Allergies and Medications: Allergies  Allergen Reactions  . Promethazine Hcl Other (See Comments)    REACTION: made her real shaky   No current facility-administered medications on file prior to encounter.    Current Outpatient Medications on File Prior to Encounter  Medication Sig Dispense Refill  . ACCU-CHEK SOFTCLIX LANCETS lancets USE AS DIRECTED TO test blood sugar 100 each 6  . acetaminophen (TYLENOL) 500 MG tablet Take 500-1,000 mg by mouth every 6 (six) hours as needed for fever (pain).    Marland Kitchen. allopurinol (  ZYLOPRIM) 300 MG tablet TAKE one tablet BY MOUTH EVERY DAY 30 tablet 11  . aspirin 81 MG chewable tablet Chew 81 mg by mouth daily.    . benzonatate (TESSALON) 100 MG capsule Take 1 capsule (100 mg total) by mouth 2 (two) times daily as needed for cough. 20 capsule 0  . cyanocobalamin 500 MCG tablet Take 1 tablet (500 mcg total) by mouth daily. 30 tablet 5  . cycloSPORINE (RESTASIS) 0.05 % ophthalmic emulsion Place 1 drop into both eyes 2 (two) times daily.    . diazepam (VALIUM) 5 MG tablet TAKE 1 TABLET BY MOUTH EVERY 8 HOURS AS NEEDED FOR ANXIETY    . diclofenac sodium (VOLTAREN) 1 % GEL Apply 2 g topically 4 (four) times daily. 100 g 3  . folic acid (FOLVITE) 1 MG tablet TAKE 1 TABLET BY MOUTH EVERY DAY 90 tablet 1  . glucose blood (ACCU-CHEK AVIVA PLUS) test strip USE TO CHECK BLOOD SUGAR ONCE DAILY. Dx code: E11.9 100 strip 12  . HYDROcodone-Chlorpheniramine 5-4 MG/5ML SOLN Use 1 tsp at night for severe cough.  Do not take with valium 70 mL 0  . isosorbide mononitrate (IMDUR) 30 MG 24 hr tablet Take 0.5 tablets (15 mg total) by mouth daily.    Marland Kitchen. KLOR-CON M20 20 MEQ tablet TAKE 2 TABLETS BY MOUTH TWICE A DAY 360 tablet 0  . loratadine (CLARITIN) 10 MG tablet Take 10 mg by mouth daily as needed for allergies.     Marland Kitchen.  meclizine (ANTIVERT) 25 MG tablet Take 1 tablet (25 mg total) by mouth 3 (three) times daily as needed for dizziness. CONTACT PCP FOR FURTHER REFILLS (Patient not taking: Reported on 07/11/2018) 30 tablet 3  . Menthol-Methyl Salicylate (TIGER BALM LINIMENT EX) Apply 1 application topically daily.    . metFORMIN (GLUCOPHAGE) 500 MG tablet Take 1 tablet (500 mg total) by mouth daily with breakfast. 120 tablet 0  . metFORMIN (GLUCOPHAGE) 500 MG tablet TAKE 2 TABLETS BY MOUTH TWICE A DAY WITH MEALS 360 tablet 1  . metoprolol succinate (TOPROL-XL) 25 MG 24 hr tablet Take 50 mg by mouth daily.    . metoprolol succinate (TOPROL-XL) 50 MG 24 hr tablet TAKE 1 TABLET BY MOUTH EVERY DAY 90 tablet 2  . nitroGLYCERIN (NITROSTAT) 0.4 MG SL tablet Place 0.4 mg under the tongue every 5 (five) minutes as needed for chest pain.    Marland Kitchen. Olopatadine HCl (PAZEO) 0.7 % SOLN Place 1 drop into both eyes daily as needed (itching/ irritation).    Marland Kitchen. omeprazole (PRILOSEC) 20 MG capsule TAKE ONE CAPSULE BY MOUTH TWICE A DAY BEFORE MEALS. INSURANCE ONLY COVERS 1 A DAY 60 capsule 1  . omeprazole (PRILOSEC) 40 MG capsule Take 1 capsule PO BID before meals 60 capsule 0  . simvastatin (ZOCOR) 10 MG tablet TAKE 1 TABLET BY MOUTH EVERY DAY 90 tablet 1  . torsemide (DEMADEX) 20 MG tablet TAKE 4 TABLETS EACH MORNING AND 2 TABLETS EACH EVENING 180 tablet 2  . warfarin (COUMADIN) 2 MG tablet TAKE 1/2-1 TABLET BY MOUTH EVERY DAY  OR AS DIRECTED BY COUMADIN CLINIC NEED APPOINTMENT WITH DR Shirlee LatchMCLEAN [phone] 30 tablet 0    Objective: BP 136/85   Pulse (!) 48   Temp 97.7 F (36.5 C) (Oral)   Resp 16   SpO2 96%  Exam: General: Alert, cooperative, patient actively vomiting and in discomfort Eyes: Well extraocular eye movements, no icterus, injected conjunctiva bilaterally. ENTM: Poor dentition, no pharyngeal erythema or exudates Neck:  Supple, nontender, no thyromegaly, no fibrosis or lymphadenopathy.  Mild JVD Cardiovascular: Irregular  rate, no S3 or S4, no rubs or gallops, sharp S1, cap refill less than 2 seconds Respiratory: Bibasilar crackles to mid zones, no wheeze or respiratory distress Gastrointestinal: Abdomen, soft nontender, bowel sounds present, no organomegaly MSK: Moving all 4 limbs, no peripheral edema Derm: No rashes, ecchymosis Neuro: Cranial nerves I- XII grossly intact, PERRLA Psych: Normal mood, normal affect  Labs and Imaging: CBC BMET  Recent Labs  Lab 04/07/19 2125  WBC 7.5  HGB 10.8*  HCT 35.1*  PLT 175   Recent Labs  Lab 04/07/19 2125  NA 139  K 4.1  CL 104  CO2 25  BUN 18  CREATININE 1.29*  GLUCOSE 256*  CALCIUM 9.0     EKG: A. fib with T wave inversion in III and flattening in V3-V6, old Q waves in lead III and aVF. No ST changes.   Dg Chest 2 View  Result Date: 04/07/2019 CLINICAL DATA:  Chest pain EXAM: CHEST - 2 VIEW COMPARISON:  07/15/2018 FINDINGS: Cardiac shadow is mildly enlarged. Postsurgical changes are noted. Blunting of the left costophrenic angle is noted stable from the prior exam. No focal infiltrate or sizable effusion is seen. Valvular replacement is noted. IMPRESSION: No acute abnormality noted. Electronically Signed   By: Alcide CleverMark  Lukens M.D.   On: 04/07/2019 21:43    Towanda OctavePatel, Poonam, MD 04/07/2019, 10:57 PM PGY-1, [facility] FPTS Intern pager: [phone], text pages welcome  FPTS Upper-Level Resident Addendum   I have independently interviewed and examined the patient. I have discussed the above with the original author and agree with their documentation. My edits for correction/addition/clarification are in blue. Please see also any attending notes.    Mirian MoFrank, Valoria Tamburri MD PGY-2, [facility] 04/08/2019 4:48 AM  FPTS Service pager: [phone] (text pages welcome through [facility])

## 2019-04-07 NOTE — Addendum Note (Signed)
Addended by: Christen Bame D on: 04/07/2019 08:17 AM   Modules accepted: Orders

## 2019-04-08 ENCOUNTER — Other Ambulatory Visit: Payer: Self-pay

## 2019-04-08 ENCOUNTER — Encounter (HOSPITAL_COMMUNITY): Payer: Self-pay

## 2019-04-08 ENCOUNTER — Observation Stay (HOSPITAL_COMMUNITY): Payer: Medicare Other

## 2019-04-08 DIAGNOSIS — I361 Nonrheumatic tricuspid (valve) insufficiency: Secondary | ICD-10-CM

## 2019-04-08 DIAGNOSIS — N183 Chronic kidney disease, stage 3 (moderate): Secondary | ICD-10-CM | POA: Diagnosis present

## 2019-04-08 DIAGNOSIS — E1169 Type 2 diabetes mellitus with other specified complication: Secondary | ICD-10-CM | POA: Diagnosis present

## 2019-04-08 DIAGNOSIS — D560 Alpha thalassemia: Secondary | ICD-10-CM | POA: Diagnosis present

## 2019-04-08 DIAGNOSIS — I5033 Acute on chronic diastolic (congestive) heart failure: Secondary | ICD-10-CM | POA: Diagnosis not present

## 2019-04-08 DIAGNOSIS — M25572 Pain in left ankle and joints of left foot: Secondary | ICD-10-CM | POA: Diagnosis present

## 2019-04-08 DIAGNOSIS — G8929 Other chronic pain: Secondary | ICD-10-CM | POA: Diagnosis present

## 2019-04-08 DIAGNOSIS — M109 Gout, unspecified: Secondary | ICD-10-CM | POA: Diagnosis present

## 2019-04-08 DIAGNOSIS — M25562 Pain in left knee: Secondary | ICD-10-CM | POA: Diagnosis present

## 2019-04-08 DIAGNOSIS — Z20828 Contact with and (suspected) exposure to other viral communicable diseases: Secondary | ICD-10-CM | POA: Diagnosis present

## 2019-04-08 DIAGNOSIS — I25110 Atherosclerotic heart disease of native coronary artery with unstable angina pectoris: Secondary | ICD-10-CM | POA: Diagnosis present

## 2019-04-08 DIAGNOSIS — E1122 Type 2 diabetes mellitus with diabetic chronic kidney disease: Secondary | ICD-10-CM | POA: Diagnosis present

## 2019-04-08 DIAGNOSIS — Z7901 Long term (current) use of anticoagulants: Secondary | ICD-10-CM

## 2019-04-08 DIAGNOSIS — I482 Chronic atrial fibrillation, unspecified: Secondary | ICD-10-CM

## 2019-04-08 DIAGNOSIS — I351 Nonrheumatic aortic (valve) insufficiency: Secondary | ICD-10-CM | POA: Diagnosis not present

## 2019-04-08 DIAGNOSIS — I200 Unstable angina: Secondary | ICD-10-CM

## 2019-04-08 DIAGNOSIS — K219 Gastro-esophageal reflux disease without esophagitis: Secondary | ICD-10-CM | POA: Diagnosis present

## 2019-04-08 DIAGNOSIS — F419 Anxiety disorder, unspecified: Secondary | ICD-10-CM | POA: Diagnosis present

## 2019-04-08 DIAGNOSIS — Z952 Presence of prosthetic heart valve: Secondary | ICD-10-CM

## 2019-04-08 DIAGNOSIS — R079 Chest pain, unspecified: Secondary | ICD-10-CM | POA: Diagnosis present

## 2019-04-08 DIAGNOSIS — I130 Hypertensive heart and chronic kidney disease with heart failure and stage 1 through stage 4 chronic kidney disease, or unspecified chronic kidney disease: Secondary | ICD-10-CM | POA: Diagnosis present

## 2019-04-08 DIAGNOSIS — I970 Postcardiotomy syndrome: Secondary | ICD-10-CM | POA: Diagnosis present

## 2019-04-08 DIAGNOSIS — I5032 Chronic diastolic (congestive) heart failure: Secondary | ICD-10-CM | POA: Diagnosis present

## 2019-04-08 DIAGNOSIS — D509 Iron deficiency anemia, unspecified: Secondary | ICD-10-CM | POA: Diagnosis present

## 2019-04-08 DIAGNOSIS — E1165 Type 2 diabetes mellitus with hyperglycemia: Secondary | ICD-10-CM | POA: Diagnosis present

## 2019-04-08 DIAGNOSIS — J8410 Pulmonary fibrosis, unspecified: Secondary | ICD-10-CM | POA: Diagnosis present

## 2019-04-08 DIAGNOSIS — M25561 Pain in right knee: Secondary | ICD-10-CM | POA: Diagnosis present

## 2019-04-08 DIAGNOSIS — M25571 Pain in right ankle and joints of right foot: Secondary | ICD-10-CM | POA: Diagnosis present

## 2019-04-08 DIAGNOSIS — K760 Fatty (change of) liver, not elsewhere classified: Secondary | ICD-10-CM | POA: Diagnosis present

## 2019-04-08 DIAGNOSIS — E785 Hyperlipidemia, unspecified: Secondary | ICD-10-CM | POA: Diagnosis present

## 2019-04-08 DIAGNOSIS — R072 Precordial pain: Secondary | ICD-10-CM | POA: Diagnosis present

## 2019-04-08 DIAGNOSIS — I2510 Atherosclerotic heart disease of native coronary artery without angina pectoris: Secondary | ICD-10-CM | POA: Diagnosis not present

## 2019-04-08 LAB — HEPATIC FUNCTION PANEL
ALT: 28 U/L (ref 0–44)
AST: 29 U/L (ref 15–41)
Albumin: 4.2 g/dL (ref 3.5–5.0)
Alkaline Phosphatase: 87 U/L (ref 38–126)
Bilirubin, Direct: 0.2 mg/dL (ref 0.0–0.2)
Indirect Bilirubin: 1.2 mg/dL — ABNORMAL HIGH (ref 0.3–0.9)
Total Bilirubin: 1.4 mg/dL — ABNORMAL HIGH (ref 0.3–1.2)
Total Protein: 7.3 g/dL (ref 6.5–8.1)

## 2019-04-08 LAB — CBC
HCT: 33.6 % — ABNORMAL LOW (ref 36.0–46.0)
Hemoglobin: 10.5 g/dL — ABNORMAL LOW (ref 12.0–15.0)
MCH: 23.9 pg — ABNORMAL LOW (ref 26.0–34.0)
MCHC: 31.3 g/dL (ref 30.0–36.0)
MCV: 76.5 fL — ABNORMAL LOW (ref 80.0–100.0)
Platelets: 154 10*3/uL (ref 150–400)
RBC: 4.39 MIL/uL (ref 3.87–5.11)
RDW: 15.4 % (ref 11.5–15.5)
WBC: 9.7 10*3/uL (ref 4.0–10.5)
nRBC: 0 % (ref 0.0–0.2)

## 2019-04-08 LAB — IRON AND TIBC
Iron: 43 ug/dL (ref 28–170)
Saturation Ratios: 12 % (ref 10.4–31.8)
TIBC: 364 ug/dL (ref 250–450)
UIBC: 321 ug/dL

## 2019-04-08 LAB — LIPID PANEL
Cholesterol: 174 mg/dL (ref 0–200)
HDL: 49 mg/dL (ref >40)
LDL Cholesterol: 88 mg/dL (ref 0–99)
Total CHOL/HDL Ratio: 3.6 RATIO
Triglycerides: 185 mg/dL — ABNORMAL HIGH (ref <150)
VLDL: 37 mg/dL (ref 0–40)

## 2019-04-08 LAB — HIV ANTIBODY (ROUTINE TESTING W REFLEX): HIV Screen 4th Generation wRfx: NONREACTIVE

## 2019-04-08 LAB — BASIC METABOLIC PANEL
Anion gap: 11 (ref 5–15)
BUN: 17 mg/dL (ref 8–23)
CO2: 24 mmol/L (ref 22–32)
Calcium: 9 mg/dL (ref 8.9–10.3)
Chloride: 105 mmol/L (ref 98–111)
Creatinine, Ser: 1.21 mg/dL — ABNORMAL HIGH (ref 0.44–1.00)
GFR calc Af Amer: 54 mL/min — ABNORMAL LOW (ref >60)
GFR calc non Af Amer: 47 mL/min — ABNORMAL LOW (ref >60)
Glucose, Bld: 221 mg/dL — ABNORMAL HIGH (ref 70–99)
Potassium: 4.8 mmol/L (ref 3.5–5.1)
Sodium: 140 mmol/L (ref 135–145)

## 2019-04-08 LAB — ECHOCARDIOGRAM COMPLETE
Height: 59 in
Weight: 2297.6 oz

## 2019-04-08 LAB — HEMOGLOBIN A1C
Hgb A1c MFr Bld: 6.6 % — ABNORMAL HIGH (ref 4.8–5.6)
Mean Plasma Glucose: 142.72 mg/dL

## 2019-04-08 LAB — PROTIME-INR
INR: 2.5 — ABNORMAL HIGH (ref 0.8–1.2)
Prothrombin Time: 26.7 seconds — ABNORMAL HIGH (ref 11.4–15.2)

## 2019-04-08 LAB — LIPASE, BLOOD: Lipase: 40 U/L (ref 11–51)

## 2019-04-08 LAB — TROPONIN I (HIGH SENSITIVITY): Troponin I (High Sensitivity): 7 ng/L (ref <18)

## 2019-04-08 LAB — BRAIN NATRIURETIC PEPTIDE: B Natriuretic Peptide: 144.4 pg/mL — ABNORMAL HIGH (ref 0.0–100.0)

## 2019-04-08 LAB — FERRITIN: Ferritin: 68 ng/mL (ref 11–307)

## 2019-04-08 LAB — SARS CORONAVIRUS 2 BY RT PCR (HOSPITAL ORDER, PERFORMED IN ~~LOC~~ HOSPITAL LAB): SARS Coronavirus 2: NEGATIVE

## 2019-04-08 MED ORDER — WARFARIN SODIUM 1 MG PO TABS: 1.0000 mg | ORAL_TABLET | Freq: Every day | ORAL | Status: DC

## 2019-04-08 MED ORDER — FUROSEMIDE 10 MG/ML IJ SOLN
60.0000 mg | Freq: Once | INTRAMUSCULAR | Status: AC
  Administered 2019-04-08: 60 mg via INTRAVENOUS
  Filled 2019-04-08: qty 6

## 2019-04-08 MED ORDER — ASPIRIN 81 MG PO CHEW
81.0000 mg | CHEWABLE_TABLET | Freq: Every day | ORAL | Status: DC
  Administered 2019-04-08 – 2019-04-09 (×2): 81 mg via ORAL
  Filled 2019-04-08 (×2): qty 1

## 2019-04-08 MED ORDER — METOCLOPRAMIDE HCL 5 MG/ML IJ SOLN
5.0000 mg | Freq: Once | INTRAMUSCULAR | Status: AC
  Administered 2019-04-08: 5 mg via INTRAVENOUS
  Filled 2019-04-08: qty 2

## 2019-04-08 MED ORDER — METOPROLOL TARTRATE 5 MG/5ML IV SOLN
INTRAVENOUS | Status: AC
  Administered 2019-04-08: 2.5 mg
  Filled 2019-04-08: qty 5

## 2019-04-08 MED ORDER — ALLOPURINOL 300 MG PO TABS
300.0000 mg | ORAL_TABLET | Freq: Every day | ORAL | Status: DC
  Administered 2019-04-09: 300 mg via ORAL
  Filled 2019-04-08: qty 1

## 2019-04-08 MED ORDER — FUROSEMIDE 10 MG/ML IJ SOLN
60.0000 mg | Freq: Once | INTRAMUSCULAR | Status: AC
  Administered 2019-04-08: 04:00:00 60 mg via INTRAVENOUS
  Filled 2019-04-08: qty 6

## 2019-04-08 MED ORDER — LORATADINE 10 MG PO TABS: 10.0000 mg | ORAL_TABLET | Freq: Every day | ORAL | Status: DC | PRN

## 2019-04-08 MED ORDER — ALUM & MAG HYDROXIDE-SIMETH 200-200-20 MG/5ML PO SUSP: 30.0000 mL | Freq: Once | ORAL | Status: DC

## 2019-04-08 MED ORDER — SIMVASTATIN 20 MG PO TABS
10.0000 mg | ORAL_TABLET | Freq: Every day | ORAL | Status: DC
  Administered 2019-04-08 – 2019-04-09 (×2): 10 mg via ORAL
  Filled 2019-04-08 (×2): qty 1

## 2019-04-08 MED ORDER — OLOPATADINE HCL 0.1 % OP SOLN: 1.0000 [drp] | Freq: Every day | OPHTHALMIC | Status: DC | PRN

## 2019-04-08 MED ORDER — NITROGLYCERIN 0.4 MG SL SUBL
SUBLINGUAL_TABLET | SUBLINGUAL | Status: AC
  Filled 2019-04-08: qty 2

## 2019-04-08 MED ORDER — TORSEMIDE 20 MG PO TABS: 20.0000 mg | ORAL_TABLET | Freq: Every day | ORAL | Status: DC

## 2019-04-08 MED ORDER — METOPROLOL SUCCINATE ER 50 MG PO TB24
50.0000 mg | ORAL_TABLET | Freq: Every day | ORAL | Status: DC
  Administered 2019-04-08 – 2019-04-09 (×2): 50 mg via ORAL
  Filled 2019-04-08: qty 2
  Filled 2019-04-08: qty 1

## 2019-04-08 MED ORDER — ISOSORBIDE MONONITRATE ER 30 MG PO TB24
15.0000 mg | ORAL_TABLET | Freq: Every day | ORAL | Status: DC
  Administered 2019-04-08 – 2019-04-09 (×2): 15 mg via ORAL
  Filled 2019-04-08 (×2): qty 1

## 2019-04-08 MED ORDER — PANTOPRAZOLE SODIUM 40 MG PO TBEC
80.0000 mg | DELAYED_RELEASE_TABLET | Freq: Every day | ORAL | Status: DC
  Administered 2019-04-08 – 2019-04-09 (×2): 80 mg via ORAL
  Filled 2019-04-08 (×2): qty 2

## 2019-04-08 MED ORDER — DIAZEPAM 5 MG PO TABS: 5.0000 mg | ORAL_TABLET | Freq: Two times a day (BID) | ORAL | Status: DC | PRN

## 2019-04-08 MED ORDER — DICLOFENAC SODIUM 1 % TD GEL
2.0000 g | Freq: Four times a day (QID) | TRANSDERMAL | Status: DC
  Administered 2019-04-08 – 2019-04-09 (×2): 2 g via TOPICAL
  Filled 2019-04-08: qty 100

## 2019-04-08 MED ORDER — CYCLOSPORINE 0.05 % OP EMUL
1.0000 [drp] | Freq: Two times a day (BID) | OPHTHALMIC | Status: DC
  Administered 2019-04-08 – 2019-04-09 (×2): 1 [drp] via OPHTHALMIC
  Filled 2019-04-08 (×4): qty 30

## 2019-04-08 MED ORDER — ACETAMINOPHEN 500 MG PO TABS: 500.0000 mg | ORAL_TABLET | Freq: Four times a day (QID) | ORAL | Status: DC | PRN

## 2019-04-08 MED ORDER — NITROGLYCERIN 0.4 MG SL SUBL: 0.4000 mg | SUBLINGUAL_TABLET | SUBLINGUAL | Status: DC | PRN

## 2019-04-08 MED ORDER — BENZONATATE 100 MG PO CAPS: 100.0000 mg | ORAL_CAPSULE | Freq: Two times a day (BID) | ORAL | Status: DC | PRN

## 2019-04-08 MED ORDER — WARFARIN - PHARMACIST DOSING INPATIENT: Freq: Every day | Status: DC

## 2019-04-08 MED ORDER — TORSEMIDE 20 MG PO TABS: 40.0000 mg | ORAL_TABLET | Freq: Every day | ORAL | Status: DC

## 2019-04-08 MED ORDER — IOHEXOL 350 MG/ML SOLN
80.0000 mL | Freq: Once | INTRAVENOUS | Status: AC | PRN
  Administered 2019-04-08: 80 mL via INTRAVENOUS

## 2019-04-08 MED ORDER — WARFARIN SODIUM 1 MG PO TABS
1.0000 mg | ORAL_TABLET | Freq: Once | ORAL | Status: AC
  Administered 2019-04-08: 1 mg via ORAL
  Filled 2019-04-08: qty 1

## 2019-04-08 MED ORDER — TORSEMIDE 20 MG PO TABS
80.0000 mg | ORAL_TABLET | Freq: Every day | ORAL | Status: DC
  Administered 2019-04-08: 80 mg via ORAL
  Filled 2019-04-08: qty 4

## 2019-04-08 MED ORDER — NITROGLYCERIN 0.4 MG SL SUBL: 0.8000 mg | SUBLINGUAL_TABLET | Freq: Once | SUBLINGUAL | Status: DC

## 2019-04-08 NOTE — Consult Note (Addendum)
Advanced Heart Failure Team Consult Note   Primary Physician: Leeroy BockAnderson, Chelsey L, DO PCP-Cardiologist:  No primary care provider on file.  Reason for Consultation: Chest Pain   HPI:    Deborah [name] is seen today for evaluation of chest pain at the request of Dr Manson PasseyBrown.   68 year old with history ofrheumatic heart disease and MV replacement/TV repair, chronic atrial fibrillation, and chronic diastolic CHF.  Echo in 4/18 showed EF 60-65% with normally functioning mechanical mitral valve.   Yesterday she presented to the ED with chest pain with squeezing, tightness, and left arm pain. CXR no acute abnormality. Pertinent admission labs included: HS Trop 6>7, BNP 144, K 4.1, Creatinine 1.3, and Hgb 10.8.    Review of Systems: [y] = yes, [ ]  = no   . General: Weight gain [ ] ; Weight loss [ ] ; Anorexia [ ] ; Fatigue [ Y]; Fever [ ] ; Chills [ ] ; Weakness [ ]   . Cardiac: Chest pain/pressure [Y ]; Resting SOB [ ] ; Exertional SOB [ ] ; Orthopnea [ ] ; Pedal Edema [ ] ; Palpitations [ ] ; Syncope [ ] ; Presyncope [ ] ; Paroxysmal nocturnal dyspnea[ ]   . Pulmonary: Cough [ ] ; Wheezing[ ] ; Hemoptysis[ ] ; Sputum [ ] ; Snoring [ ]   . GI: Vomiting[ ] ; Dysphagia[ ] ; Melena[ ] ; Hematochezia [ ] ; Heartburn[ ] ; Abdominal pain [ ] ; Constipation [ ] ; Diarrhea [ ] ; BRBPR [ ]   . GU: Hematuria[ ] ; Dysuria [ ] ; Nocturia[ ]   . Vascular: Pain in legs with walking [ ] ; Pain in feet with lying flat [ ] ; Non-healing sores [ ] ; Stroke [ ] ; TIA [ ] ; Slurred speech [ ] ;  . Neuro: Headaches[ ] ; Vertigo[ ] ; Seizures[ ] ; Paresthesias[ ] ;Blurred vision [ ] ; Diplopia [ ] ; Vision changes [ ]   . Ortho/Skin: Arthritis [ ] ; Joint pain [Y ]; Muscle pain [ ] ; Joint swelling [ ] ; Back Pain [Y ]; Rash [ ]   . Psych: Depression[ ] ; Anxiety[Y ]  . Heme: Bleeding problems [ ] ; Clotting disorders [ ] ; Anemia [ ]   . Endocrine: Diabetes [ ] ; Thyroid dysfunction[ ]   Home Medications Prior to Admission medications   Medication Sig Start  Date End Date Taking? Authorizing Provider  acetaminophen (TYLENOL) 500 MG tablet Take 500-1,000 mg by mouth every 6 (six) hours as needed for fever (pain).   Yes [provider]  allopurinol (ZYLOPRIM) 300 MG tablet TAKE one tablet BY MOUTH EVERY DAY Patient taking differently: Take 300 mg by mouth daily.  05/22/17  Yes Bensimhon, Bevelyn Bucklesaniel R, MD  aspirin 81 MG chewable tablet Chew 81 mg by mouth daily.   Yes [provider]  cyanocobalamin 500 MCG tablet Take 1 tablet (500 mcg total) by mouth daily. 08/13/14  Yes Malachy MoodFeng, Yan, MD  cycloSPORINE (RESTASIS) 0.05 % ophthalmic emulsion Place 1 drop into both eyes 2 (two) times daily.   Yes [provider]  diazepam (VALIUM) 5 MG tablet Take 5 mg by mouth every 8 (eight) hours as needed for anxiety.  12/17/14  Yes [provider]  diclofenac sodium (VOLTAREN) 1 % GEL Apply 2 g topically 4 (four) times daily. 01/22/18  Yes Chambliss, Estill BattenMarshall L, MD  folic acid (FOLVITE) 1 MG tablet TAKE 1 TABLET BY MOUTH EVERY DAY Patient taking differently: Take 1 mg by mouth daily.  05/30/18  Yes Bensimhon, Bevelyn Bucklesaniel R, MD  isosorbide mononitrate (IMDUR) 60 MG 24 hr tablet Take 60 mg by mouth daily.   Yes [provider]  KLOR-CON M20  20 MEQ tablet TAKE 2 TABLETS BY MOUTH TWICE A DAY Patient taking differently: Take 40 mEq by mouth 2 (two) times daily.  10/29/18  Yes Laurey MoraleMcLean, Alanya Vukelich S, MD  loratadine (CLARITIN) 10 MG tablet Take 10 mg by mouth daily as needed for allergies.    Yes [provider]  Menthol-Methyl Salicylate (TIGER BALM LINIMENT EX) Apply 1 application topically daily.   Yes [provider]  metFORMIN (GLUCOPHAGE) 500 MG tablet Take 1 tablet (500 mg total) by mouth daily with breakfast. Patient taking differently: Take 500 mg by mouth 2 (two) times daily with a meal.  05/21/18  Yes Freddrick MarchAmin, Yashika, MD  metoprolol succinate (TOPROL-XL) 50 MG 24 hr tablet TAKE 1 TABLET BY MOUTH EVERY DAY Patient taking  differently: Take 50 mg by mouth daily.  03/03/19  Yes Bensimhon, Bevelyn Bucklesaniel R, MD  nitroGLYCERIN (NITROSTAT) 0.4 MG SL tablet Place 0.4 mg under the tongue every 5 (five) minutes as needed for chest pain.   Yes [provider]  Olopatadine HCl (PAZEO) 0.7 % SOLN Place 1 drop into both eyes daily as needed (itching/ irritation).   Yes [provider]  omeprazole (PRILOSEC) 20 MG capsule TAKE ONE CAPSULE BY MOUTH TWICE A DAY BEFORE MEALS. INSURANCE ONLY COVERS 1 A DAY Patient taking differently: Take 20 mg by mouth 2 (two) times daily before a meal. Take with omeprazole 40 mg to equal 60 mg 01/29/19  Yes Freddrick MarchAmin, Yashika, MD  omeprazole (PRILOSEC) 40 MG capsule Take 1 capsule PO BID before meals Patient taking differently: Take 40 mg by mouth 2 (two) times daily. Take with Omeprazole 20 mg to equal 60 mg 01/28/19  Yes Freddrick MarchAmin, Yashika, MD  simvastatin (ZOCOR) 10 MG tablet TAKE 1 TABLET BY MOUTH EVERY DAY Patient taking differently: Take 10 mg by mouth daily at 6 PM.  03/20/19  Yes Bensimhon, Bevelyn Bucklesaniel R, MD  torsemide (DEMADEX) 20 MG tablet TAKE 4 TABLETS EACH MORNING AND 2 TABLETS EACH EVENING Patient taking differently: Take 20-80 mg by mouth See admin instructions. TAKE 4 TABLETS EACH MORNING AND 1 TABLET EACH EVENING 03/26/19  Yes Laurey MoraleMcLean, Adaira Centola S, MD  warfarin (COUMADIN) 2 MG tablet TAKE 1/2-1 TABLET BY MOUTH EVERY DAY  OR AS DIRECTED BY COUMADIN CLINIC NEED APPOINTMENT WITH DR Shirlee LatchMCLEAN [phone] Patient taking differently: Take 1-2 mg by mouth See admin instructions. Take 1/2 tablet on Monday, Wednesday, Thursday and Saturday then take 1 tablet all the other days 03/24/19  Yes Laurey MoraleMcLean, Annastacia Duba S, MD  ACCU-CHEK SOFTCLIX LANCETS lancets USE AS DIRECTED TO test blood sugar 09/26/17   Freddrick MarchAmin, Yashika, MD  glucose blood (ACCU-CHEK AVIVA PLUS) test strip USE TO CHECK BLOOD SUGAR ONCE DAILY. Dx code: E11.9 04/07/19   Carney Livinghambliss, Marshall L, MD    Past Medical History: Past Medical History:  Diagnosis Date  .  Allergic rhinitis   . Anemia   . Borderline diabetes   . Carpal tunnel syndrome   . Chronic atrial fibrillation    [facility];  Event montor (7/12-8/12) showed no high rate episodes (patient continuously in atrial fibrillation).   . Chronic diastolic heart failure (HCC)   . Eosinophilia 01/27/2013  . External hemorrhoid   . External hemorrhoids   . Fatigue   . Gout   . 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 Gaylyn RongHa  . Hx of cardiovascular stress test    ETT-myoview (7/12): No evidence for ischemia or infarction  .  Hyperlipidemia   . Hypertension   . Obesity   . Rheumatic heart disease    Status post mechanical ([employer]. [employer]) mitral valve replacement and tricuspid valve repair at [facility] in 2002; echo 5/12:   EF 60-65%, mild AI, mitral valve prosthesis with AVA 1.66, severe LAE, moderate RAE, moderate to severe TR, mild increased pulmonary artery systolic pressure;    Adenosine Cardiolite in 4/09:   No ischemia, EF 66%  . Rotator cuff syndrome     Past Surgical History: Past Surgical History:  Procedure Laterality Date  . CARDIAC CATHETERIZATION     EF 55%  . TRICUSPID VALVE REPAIR      Family History: Family History  Problem Relation Age of Onset  . Stroke Mother   . Diabetes Mother   . Heart failure Mother        pacemaker  . Heart disease Father        "heart problems"  . Heart disease Sister   . Heart disease Brother   . Gout Brother   . Diabetes Brother   . Stroke Brother   . Valvular heart disease Sister     Social History: Social History   Socioeconomic History  . Marital status: Married    Spouse name: Not on file  . Number of children: Not on file  . Years of education: Not on file  . Highest education level: Not on file  Occupational History  . Not on file  Social Needs  . Financial resource strain: Not on file  . Food insecurity    Worry: Not on file    Inability: Not on file   . Transportation needs    Medical: Not on file    Non-medical: Not on file  Tobacco Use  . Smoking status: Never Smoker  . Smokeless tobacco: Never Used  Substance and Sexual Activity  . Alcohol use: No  . Drug use: No  . Sexual activity: Not on file  Lifestyle  . Physical activity    Days per week: Not on file    Minutes per session: Not on file  . Stress: Not on file  Relationships  . Social Musician on phone: Not on file    Gets together: Not on file    Attends religious service: Not on file    Active member of club or organization: Not on file    Attends meetings of clubs or organizations: Not on file    Relationship status: Not on file  Other Topics Concern  . Not on file  Social History Narrative  . Not on file    Allergies:  Allergies  Allergen Reactions  . Promethazine Hcl Other (See Comments)    REACTION: made her real shaky    Objective:    Vital Signs:   Temp:  [97.7 F (36.5 C)] 97.7 F (36.5 C) (08/11 2117) Pulse Rate:  [46-83] 82 (08/12 0925) Resp:  [13-23] 14 (08/12 0839) BP: (110-158)/(57-94) 115/61 (08/12 0925) SpO2:  [92 %-99 %] 95 % (08/12 0839)    Weight change: There were no vitals filed for this visit.  Intake/Output:   Intake/Output Summary (Last 24 hours) at 04/08/2019 0946 Last data filed at 04/08/2019 0404 Gross per 24 hour  Intake -  Output 400 ml  Net -400 ml      Physical Exam    General:  Well appearing. No resp difficulty HEENT: normal Neck: supple. JVP 8-9 cm. Carotids 2+ bilat; no bruits. No lymphadenopathy or  thyromegaly appreciated. Cor: PMI nondisplaced. Irregular rate & rhythm. No rubs, gallops. Mechanical S1 . Lungs: clear Abdomen: soft, nontender, nondistended. No hepatosplenomegaly. No bruits or masses. Good bowel sounds. Extremities: no cyanosis, clubbing, rash, edema Neuro: alert & orientedx3, cranial nerves grossly intact. moves all 4 extremities w/o difficulty. Affect pleasant   Telemetry     A Fib   EKG    A fib 80 bpm   Labs   Basic Metabolic Panel: Recent Labs  Lab 04/07/19 2125 04/08/19 0309  NA 139 140  K 4.1 4.8  CL 104 105  CO2 25 24  GLUCOSE 256* 221*  BUN 18 17  CREATININE 1.29* 1.21*  CALCIUM 9.0 9.0    Liver Function Tests: Recent Labs  Lab 04/08/19 0043  AST 29  ALT 28  ALKPHOS 87  BILITOT 1.4*  PROT 7.3  ALBUMIN 4.2   Recent Labs  Lab 04/08/19 0043  LIPASE 40   No results for input(s): AMMONIA in the last 168 hours.  CBC: Recent Labs  Lab 04/07/19 2125 04/08/19 0309  WBC 7.5 9.7  HGB 10.8* 10.5*  HCT 35.1* 33.6*  MCV 77.1* 76.5*  PLT 175 154    Cardiac Enzymes: No results for input(s): CKTOTAL, CKMB, CKMBINDEX, TROPONINI in the last 168 hours.  BNP: BNP (last 3 results) Recent Labs    04/07/19 2152  BNP 144.4*    ProBNP (last 3 results) No results for input(s): PROBNP in the last 8760 hours.   CBG: No results for input(s): GLUCAP in the last 168 hours.  Coagulation Studies: Recent Labs    04/07/19 2125  LABPROT 26.9*  INR 2.5*     Imaging   Dg Chest 2 View  Result Date: 04/07/2019 CLINICAL DATA:  Chest pain EXAM: CHEST - 2 VIEW COMPARISON:  07/15/2018 FINDINGS: Cardiac shadow is mildly enlarged. Postsurgical changes are noted. Blunting of the left costophrenic angle is noted stable from the prior exam. No focal infiltrate or sizable effusion is seen. Valvular replacement is noted. IMPRESSION: No acute abnormality noted. Electronically Signed   By: Alcide CleverMark  Lukens M.D.   On: 04/07/2019 21:43      Medications:     Current Medications: . allopurinol  300 mg Oral Daily  . alum & mag hydroxide-simeth  30 mL Oral Once  . aspirin  81 mg Oral Daily  . cycloSPORINE  1 drop Both Eyes BID  . diclofenac sodium  2 g Topical QID  . isosorbide mononitrate  15 mg Oral Daily  . metoprolol succinate  50 mg Oral Daily  . pantoprazole  80 mg Oral Daily  . simvastatin  10 mg Oral Daily  . sodium chloride flush   3 mL Intravenous Once  . torsemide  80 mg Oral Daily  . warfarin  1 mg Oral q1800     Infusions:      Assessment/Plan   1. Chest Pain  HS Trop 6>7  Set up for Coronary CT to assess.   2. Chronic Diastolic Heart Failure  ECHO LVH EF 60-65%.   3.Chronic A fib Rate Controlled.   4. Mechanical Mitral Valve INR goal 2.5-3.5  On coumadin. Pharmacy to dose.   Plan per Dr Shirlee LatchMcLean.    Length of Stay: 0  Tonye BecketAmy Clegg, NP  04/08/2019, 9:46 AM  Advanced Heart Failure Team Pager [phone] (M-F; 7a - 4p)  Please contact [facility] for night-coverage after hours (4p -7a ) and weekends on [email or web address]  Patient seen with NP, agree with the above  note.    She was admitted with a chest pain episode.  She has a history of chronic atypical chest pain and upper back pain, this has been going on for years.  However, last night, she ate several bites of her dinner then developed a severe squeezing substernal chest pain.  It did not resolve with NTG.  Chest pain continued for 4+ hours, finally resolving in the ER with morphine.  No chest pain currently.  She has also noted mildly increased exertional dyspnea recently.   ECG showed atrial fibrillation, otherwise unremarkable.   General: NAD Neck: JVP 8-9 cm, no thyromegaly or thyroid nodule.  Lungs: Clear to auscultation bilaterally with normal respiratory effort. CV: Nondisplaced PMI.  Heart regular S1/S2 with mechanical S1, no S3/S4, no murmur.  No peripheral edema.   Abdomen: Soft, nontender, no hepatosplenomegaly, no distention.  Skin: Intact without lesions or rashes.  Neurologic: Alert and oriented x 3.  Psych: Normal affect. Extremities: No clubbing or cyanosis.  HEENT: Normal.   1. Chest pain: Has had a chronic pattern of mild atypical chest pain.  However, she had a severe episode last night after eating a few bites of dinner, lasted about 4 hrs and resolved with NTG.  Hs-TnI was negative x 2 (6 and 7).  ECG showed no acute  changes.  With prolonged chest pain (hours) and negative Hs-TnI, I suspect that her chest pain is most likely noncardiac, ?esophageal spasm as it happened while eating.  Would avoid cath if possible as this will require bridging Lovenox off warfarin.  - I will arrange for coronary CT today to assess for obstructive coronary disease.  Her HR is in the 50s on Toprol XL so should be ok for CT.  - Continue ASA/warfarin and statin.  - I will get an echo to make sure there are no new wall motion abnormalities.  2. Atrial fibrillation: Chronic.  Continue warfarin.  Continue Toprol XL 50 mg daily.     4. Mechanical mitral valve: Continue ASA 81 daily and coumadin with INR goal 2.5-3.5.  Will need bridging with heparin/Lovenox if has to come off coumadin.  Mechanical MV looked ok on echo in 4/18.  - I will repeat echo this admission to reassess MV.  5. Diastolic CHF:  Mild volume overload by exam.  - She got a dose of Lasix 60 mg IV this morning.  I will give another dose Lasix 60 mg IV this afternoon and probably back to torsemide 80 qam/40 qpm tomorrow.  - Echo today.   Loralie Champagne 04/08/2019 11:49 AM

## 2019-04-08 NOTE — Progress Notes (Addendum)
Family Medicine Teaching Service Daily Progress Note Intern Pager: [phone]  Patient name: Deborah [name] Medical record number: [id number] Date of birth: 08/01/1951 Age: 68 y.o. Gender: female  Primary Care Provider: Leeroy BockAnderson, Chelsey L, DO Consultants: IP CONSULT TO FAMILY PRACTICE Code Status: Full Code   Pt Overview and Major Events to Date:  Hospital Day: 2 04/07/2019: admitted for Chest Pain  Assessment and Plan: Deborah [name] is a 68 y.o. female who presented w/ chest pain .  PMHx s/f A. Fib on Coumadin, rheumatic heart disease status post MV replacement and TV repair, HTN, HLD, gout, GERD, NIDDM, anxiety  #ACS rule out #Unstable Angina Patient presenting with chest pain with exertion that improves with rest and chest pain with radiation of her left arm with eating associated with diaphoresis and shortness of breath.  Initial work-up significant for troponin VI-VII.  BNP 144, hemoglobin 10.8, INR 2.5.  On chest x-ray there were no acute findings but some potential chronic costophrenic angle blunting compared to previous radiographs.  On EKG, patient has A. fib with Q waves noted in 3 and aVF as previously seen but new T wave inversions and flattening in 3, aVF, V4 to 6.  Repeat EKG this morning significant for prolonged QTC to 520 in A. fib.  Consulted cards after this morning and they will see her to determine if she is a candidate for Cath Lab given her past cardiac history and presentation.  Should also consider other GI work-up as this could be part of her chest pain.  Patient is due for upper and lower endoscopies.  Of note, patient also had an episode of nausea vomiting yesterday after receiving morphine.  This likely due to medication reaction then a result of angina.    Follow-up cardiology recommendations  Will hold home Protonix for GERD to see if chest pain worsens or stable  Continue telemetry  #HFpEF #Right and left heart failure #S/P MV replacement (2014) #S/P  TV repair (2012), s/p balloon mitral valvuloplasty (1994) [facility].  Ejection fraction 55% Left and right atrium severely enlarged, left in right ventricles mildly enlarged.  Can consider repeating echocardiogram.  We will also obtain bilateral blood pressures in upper extremities to rule out tricuspid valve failure.  Follow-up echocardiogram  Continue home Toprol XL, simvastatin, torsemide, ASA  Hold home KDUR  Discontinue torsemide 20 mg p.m.  #CKD 3 Stable with creatinine at baseline  Avoid nephrotoxic medications  #A. fib on Coumadin Mechanical mitral valve.  INR goal 2.5-3.5.  Patient will likely need bridging with heparin should she need to go to Cath Lab or undergo any other procedures.  INR this morning is 2.5.  Patient's heart rate stable in 70s.  Continue warfarin dosing per pharmacy  HOLD coumadin, start bridging to heparin when subtherapeutic  Continue Toprol-XL  #NASH Most recent CT in September 03, 2018.  No suspicious hepatic lesions and focal fat adjacent to the falciform ligament.  Biliary system unremarkable.  Reticular opacities within the lung bases suggestive of fibrosis but no pulmonary nodules were identified.  LFTs within normal limits with mild indirect hyperbilirubinemia which is chronic.   Fibrotic lung on abdominal CT Given severely enlarged right atrium, could consider high right atrial pressures secondary to a fibrotic lung disease.  Consider right-sided heart cath  Further pulmonary work-up if patient reports shortness of breath  #HLD Continue home simvastatin 10 mg  Follow-up lipid panel  #Non-insulin-dependent diabetes Most recent  A1c in May 2019 is 6.0.  Will repeat A1c today.  At home, patient is on metformin.  In previous notes, discussed taking patient off of metformin due to at goal A1c's.  Should discuss at discharge for discharge follow-up.  Holding home metformin  Add sliding scale  with meals if glucoses > 140  #Anxiety Patient takes 5 mg of diazepam every 8 hours as needed anxiety.  Last fill date per PDMP was 11/19/2018 with #90.  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  Monitor hemoglobin  #Gout  Continue home allopurinol  #GERD   Holding home omeprazole as above  #FEN/GI:  . Fluids: KVO . Electrolytes: wnl  . Nutrition: NPO   Access: L PIV & purwick VTE prophylaxis: Chronically Anticoagulated   Disposition: Home Pending PT/OT recommendations   Subjective:  NAEO.   Objective: Temp:  [97.7 F (36.5 C)] 97.7 F (36.5 C) (08/11 2117) Pulse Rate:  [46-83] 65 (08/12 0700) Cardiac Rhythm: Atrial fibrillation (08/12 0454) Resp:  [13-23] 15 (08/12 0700) BP: (111-158)/(57-94) 112/57 (08/12 0700) SpO2:  [92 %-99 %] 97 % (08/12 0700) Intake/Output      08/11 0701 - 08/12 0700 08/12 0701 - 08/13 0700   Urine 400    Total Output 400    Net -400             Physical Exam: General: NAD, non-toxic, well-appearing, sitting comfortably in bed. Elderly.   HEENT: [place]/AT. PERRLA. EOMI.  Cardiovascular: RRR, normal S1, S2. B/L 2+ RP. Trace BLEE Respiratory: CTAB. No IWOB.  Abdomen: + BS. NT, ND, soft to palpation.  Extremities: Warm and well perfused. Moving spontaneously.  Integumentary: No obvious rashes, lesions, trauma on general exam.  Laboratory: I have personally read and reviewed all labs and imaging studies.  CBC: Recent Labs  Lab 04/07/19 2125 04/08/19 0309  WBC 7.5 9.7  HGB 10.8* 10.5*  HCT 35.1* 33.6*  MCV 77.1* 76.5*  PLT 175 154   CMP: Recent Labs  Lab 04/07/19 2125 04/08/19 0043 04/08/19 0309  NA 139  --  140  K 4.1  --  4.8  CL 104  --  105  CO2 25  --  24  GLUCOSE 256*  --  221*  BUN 18  --  17  CREATININE 1.29*  --  1.21*  CALCIUM 9.0  --  9.0  ALBUMIN  --  4.2  --      Micro: Covid neg   Imaging/Diagnostic Tests: Dg Chest 2 View  Result Date: 04/07/2019 CLINICAL DATA:  Chest pain EXAM: CHEST - 2 VIEW COMPARISON:  07/15/2018 FINDINGS: Cardiac shadow is mildly enlarged. Postsurgical changes are noted. Blunting of the left costophrenic angle is noted stable from the prior exam. No focal infiltrate or sizable effusion is seen. Valvular replacement is noted. IMPRESSION: No acute abnormality noted. Electronically Signed   By: Inez Catalina M.D.   On: 04/07/2019 21:43    EKG Interpretation  Date/Time:  Tuesday April 07 2019 21:53:04 EDT Ventricular Rate:  80 PR Interval:    QRS Duration: 100 QT Interval:  449 QTC Calculation: 518 R Axis:   107 Text Interpretation:  Atrial fibrillation Right axis deviation Prolonged QT interval No acute changes Confirmed by Orpah Greek [phone]) on 04/08/2019 5:13:35 AM        Wilber Oliphant, MD 04/08/2019, 8:02 AM PGY-2, [place]  Intern pager: [phone], text pages welcome

## 2019-04-08 NOTE — Progress Notes (Signed)
ANTICOAGULATION CONSULT NOTE - Initial Consult  Pharmacy Consult for warfarin Indication: hx MVR and atrial fibrillation   Vital Signs: Temp: 97.8 F (36.6 C) (08/12 1213) Temp Source: Oral (08/12 1213) BP: 150/96 (08/12 1213) Pulse Rate: 63 (08/12 1213)  Labs: Recent Labs    04/07/19 2125 04/08/19 0004 04/08/19 0309 04/08/19 1039  HGB 10.8*  --  10.5*  --   HCT 35.1*  --  33.6*  --   PLT 175  --  154  --   LABPROT 26.9*  --   --  26.7*  INR 2.5*  --   --  2.5*  CREATININE 1.29*  --  1.21*  --   TROPONINIHS 6 7  --   --      Assessment: 68 yo female admitted with chest pain. Pharmacy consulted to dose warfarin. Patient is on warfarin PTA for history of mitral valve repair and chronic AFib. Last dose of warfarin 2mg  at 2000 on 8/11. INR 2.5 on admission and today is at goal. Hgb 10.5. Plt wnl. No reported bleeding.   PTA warfarin dose: 2 mg on Tues/Friday/Sun, 1 mg on all other days.  Discussed with MD and plan is to continue with warfarin dosing tonight instead of bridging with heparin for possible cath or procedures.   Goal of Therapy:  INR 2.5-3.5 Monitor platelets by anticoagulation protocol: Yes    Plan:  -Warfarin 1 mg po x1 -Monitor daily INR, CBC, and S/S of bleeding   Cristela Felt, PharmD PGY1 Pharmacy Resident [employer]: [phone]  04/08/2019,2:18 PM

## 2019-04-08 NOTE — ED Notes (Signed)
Patient denies pain and is resting comfortably.  

## 2019-04-08 NOTE — Progress Notes (Addendum)
ANTICOAGULATION CONSULT NOTE - Initial Consult  Pharmacy Consult for warfarin Indication: hx MVR   Vital Signs: BP: 115/61 (08/12 0925) Pulse Rate: 82 (08/12 0925)  Labs: Recent Labs    04/07/19 2125 04/08/19 0004 04/08/19 0309  HGB 10.8*  --  10.5*  HCT 35.1*  --  33.6*  PLT 175  --  154  LABPROT 26.9*  --   --   INR 2.5*  --   --   CREATININE 1.29*  --  1.21*  TROPONINIHS 6 7  --      Assessment: 68 yo female admitted with chest pain.On warfarin for history of mitral valve repair, also has chronic AFib. Patient last took warfarin yesterday prior to arrival. INR from yesterday is 2.5 (goal 2.5-3.5).   PTA warfarin dose: 2 mg on Tues/Friday/Sun, 1 mg on all other days.   Goal of Therapy:  INR 2.5-3.5 Monitor platelets by anticoagulation protocol: Yes    Plan:  -Warfarin 1 mg po x1 -Daily INR   Harvel Quale 04/08/2019,9:42 AM   Addendum -MD requested to hold warfarin today and bridge to heparin -Will start heparin when INR < 2.5  Harvel Quale 04/08/2019 10:38 AM

## 2019-04-08 NOTE — ED Notes (Signed)
Pt ambulates to bathroom with SBA

## 2019-04-09 LAB — BASIC METABOLIC PANEL
Anion gap: 13 (ref 5–15)
BUN: 20 mg/dL (ref 8–23)
CO2: 23 mmol/L (ref 22–32)
Calcium: 9.1 mg/dL (ref 8.9–10.3)
Chloride: 100 mmol/L (ref 98–111)
Creatinine, Ser: 1.44 mg/dL — ABNORMAL HIGH (ref 0.44–1.00)
GFR calc Af Amer: 44 mL/min — ABNORMAL LOW (ref >60)
GFR calc non Af Amer: 38 mL/min — ABNORMAL LOW (ref >60)
Glucose, Bld: 240 mg/dL — ABNORMAL HIGH (ref 70–99)
Potassium: 4.3 mmol/L (ref 3.5–5.1)
Sodium: 136 mmol/L (ref 135–145)

## 2019-04-09 LAB — PROTIME-INR
INR: 2.4 — ABNORMAL HIGH (ref 0.8–1.2)
Prothrombin Time: 26.1 seconds — ABNORMAL HIGH (ref 11.4–15.2)

## 2019-04-09 MED ORDER — TORSEMIDE 20 MG PO TABS: 40.0000 mg | ORAL_TABLET | Freq: Every evening | ORAL | Status: DC

## 2019-04-09 MED ORDER — ISOSORBIDE MONONITRATE ER 30 MG PO TB24: 15.0000 mg | ORAL_TABLET | Freq: Every day | ORAL | 0 refills | Status: DC

## 2019-04-09 MED ORDER — WARFARIN SODIUM 2 MG PO TABS: 2.0000 mg | ORAL_TABLET | Freq: Once | ORAL | Status: DC

## 2019-04-09 MED ORDER — TORSEMIDE 20 MG PO TABS: 80.0000 mg | ORAL_TABLET | Freq: Every morning | ORAL | 0 refills | Status: DC

## 2019-04-09 MED ORDER — TORSEMIDE 20 MG PO TABS: 40.0000 mg | ORAL_TABLET | Freq: Every evening | ORAL | 0 refills | Status: DC

## 2019-04-09 MED ORDER — TORSEMIDE 20 MG PO TABS
80.0000 mg | ORAL_TABLET | Freq: Every morning | ORAL | Status: DC
  Administered 2019-04-09: 80 mg via ORAL
  Filled 2019-04-09: qty 4

## 2019-04-09 NOTE — Plan of Care (Signed)
  Problem: Education: Goal: Knowledge of General Education information will improve Description Including pain rating scale, medication(s)/side effects and non-pharmacologic comfort measures Outcome: Progressing   

## 2019-04-09 NOTE — Progress Notes (Signed)
Patient ID: Deborah [name], female   DOB: 08/10/1951, 68 y.o.   MRN: [id number]     Advanced Heart Failure Rounding Note  PCP-Cardiologist: No primary care provider on file.   Subjective:    No further chest pain, no dyspnea.    Coronary CT: Nonobstructive coronary disease.   Echo: EF 60-65%, moderate LVH, normal RV systolic function, mechanical MV functioning normally, repaired tricuspid valve with mild TR.    Objective:   Weight Range: 65.3 kg Body mass index is 29.08 kg/m.   Vital Signs:   Temp:  [97.6 F (36.4 C)-98.3 F (36.8 C)] 97.8 F (36.6 C) (08/13 0815) Pulse Rate:  [42-93] 74 (08/13 0932) Resp:  [12-25] 15 (08/13 0932) BP: (94-150)/(60-96) 113/76 (08/13 0932) SpO2:  [90 %-97 %] 94 % (08/13 0932) Weight:  [65.1 kg-65.3 kg] 65.3 kg (08/13 0355) Last BM Date: 04/07/19  Weight change: Filed Weights   04/08/19 1213 04/09/19 0355  Weight: 65.1 kg 65.3 kg    Intake/Output:   Intake/Output Summary (Last 24 hours) at 04/09/2019 1004 Last data filed at 04/09/2019 0938 Gross per 24 hour  Intake 1200 ml  Output 1300 ml  Net -100 ml      Physical Exam    General:  Well appearing. No resp difficulty HEENT: Normal Neck: Supple. JVP not elevated. Carotids 2+ bilat; no bruits. No lymphadenopathy or thyromegaly appreciated. Cor: PMI nondisplaced. Irregular rate & rhythm, mechanical S1. 1/6 SEM RUSB. Lungs: Clear Abdomen: Soft, nontender, nondistended. No hepatosplenomegaly. No bruits or masses. Good bowel sounds. Extremities: No cyanosis, clubbing, rash, edema Neuro: Alert & orientedx3, cranial nerves grossly intact. moves all 4 extremities w/o difficulty. Affect pleasant   Telemetry   Atrial fibrillation (personally reviewed)  Labs    CBC Recent Labs    04/07/19 2125 04/08/19 0309  WBC 7.5 9.7  HGB 10.8* 10.5*  HCT 35.1* 33.6*  MCV 77.1* 76.5*  PLT 175 154   Basic Metabolic Panel Recent Labs    40/98/1107/08/16 0309 04/09/19 0833  NA 140 136  K  4.8 4.3  CL 105 100  CO2 24 23  GLUCOSE 221* 240*  BUN 17 20  CREATININE 1.21* 1.44*  CALCIUM 9.0 9.1   Liver Function Tests Recent Labs    04/08/19 0043  AST 29  ALT 28  ALKPHOS 87  BILITOT 1.4*  PROT 7.3  ALBUMIN 4.2   Recent Labs    04/08/19 0043  LIPASE 40   Cardiac Enzymes No results for input(s): CKTOTAL, CKMB, CKMBINDEX, TROPONINI in the last 72 hours.  BNP: BNP (last 3 results) Recent Labs    04/07/19 2152  BNP 144.4*    ProBNP (last 3 results) No results for input(s): PROBNP in the last 8760 hours.   D-Dimer No results for input(s): DDIMER in the last 72 hours. Hemoglobin A1C Recent Labs    04/08/19 0309  HGBA1C 6.6*   Fasting Lipid Panel Recent Labs    04/08/19 0309  CHOL 174  HDL 49  LDLCALC 88  TRIG 185*  CHOLHDL 3.6   Thyroid Function Tests No results for input(s): TSH, T4TOTAL, T3FREE, THYROIDAB in the last 72 hours.  Invalid input(s): FREET3  Other results:   Imaging    Ct Coronary Morph W/cta Cor W/score W/ca W/cm &/or Wo/cm  Addendum Date: 04/08/2019   ADDENDUM REPORT: 04/08/2019 21:21 HISTORY: Chest pain.  Hx of mechanical MVR EXAM: Cardiac/Coronary  CT TECHNIQUE: The patient was scanned on a [employer] Force scanner. PROTOCOL: A 120 kV retrospective  scan was triggered in the descending thoracic aorta at 111 HU's. Axial non-contrast 3 mm slices were carried out through the heart. The data set was analyzed on a dedicated work station and scored using the Agatson method. Gantry rotation speed was 250 msecs and collimation was .6 mm. Beta blockade and 0.8 mg of sl NTG was given. The 3D data set was reconstructed in 10% intervals of the 0-90 % of the R-R cycle. Diastolic and systolic phases were analyzed on a dedicated work station using MPR, MIP and VRT modes. The patient received 80mL OMNIPAQUE IOHEXOL 350 MG/ML SOLN of contrast. FINDINGS: Coronary calcium score: The patient's coronary artery calcium score is 63, which places the  patient in the 77th percentile. Coronary arteries: Normal coronary origins.  Right dominance. Significant motion artifact from arrhythmia degrades the diagnostic accuracy of this exam. Right Coronary Artery: Large caliber dominant RCA with minimal mixed atherosclerotic plaque in the mid-RCA, <25% stenosis. Left Main Coronary Artery: No detectable plaque or stenosis. Left Anterior Descending Coronary Artery: Mild mixed atherosclerotic plaque in the proximal LAD, 25-49% stenosis. Mild atherosclerotic plaque in the mid LAD, 25-49% stenosis. Distal vessel not well visualized due to arrhythmia artifact but appears patent. Left Circumflex Artery: Minimal mixed atherosclerotic plaque in the proximal circumflex artery <25% stenosis. Distal vessel is diminutive and not well visualized due to arrhythmia artifact. Aorta: Normal size, 27 mm at the mid ascending aorta (level of the PA bifurcation) measured double oblique. No calcifications. No dissection. Aortic Valve: No calcifications. Mitral valve: Normal leaflet excursion, no evidence of pannus or thrombus seen. Tricuspid valve: S/p tricuspid valve annuloplasty ring for repair. Left atrium: Severe left atrial enlargement. Left atrial appendage: Cannot exclude thrombus vs. Incomplete contrast opacification in the left atrial appendage. No comparison cross sectional imaging available for review. Other findings: Normal pulmonary vein drainage into the left atrium. Normal size of the pulmonary artery. IMPRESSION: 1. Mild CAD, CADRADS = 2. 2. The patient's coronary artery calcium score is 63, which places the patient in the 77th percentile. 3. Normal coronary origin with right dominance. 4. Cannot exclude left atrial appendage thrombus vs incomplete contrast opacification of the LA appendage. Consider TEE vs repeat gated CTA with delay sequence or prone imaging for further clarification if clinically indicated. 5. Normally functioning mitral valve mechanical prosthesis, normal  leaflet excursion on cine images. 6.  S/p tricuspid valve repair. Electronically Signed   By: Weston BrassGayatri  Acharya   On: 04/08/2019 21:21   Result Date: 04/08/2019 EXAM: OVER-READ INTERPRETATION  CT CHEST The following report is an over-read performed by radiologist Dr. Trudie Reed[facility]iel [facility] on 04/08/2019. This over-read does not include interpretation of cardiac or coronary anatomy or pathology. The coronary calcium score/coronary CTA interpretation by the cardiologist is attached. COMPARISON:  None. FINDINGS: Aortic atherosclerosis. Within the visualized portions of the thorax there are no suspicious appearing pulmonary nodules or masses, there is no acute consolidative airspace disease, no pleural effusions, no pneumothorax and no lymphadenopathy. Visualized portions of the upper abdomen are unremarkable. There are no aggressive appearing lytic or blastic lesions noted in the visualized portions of the skeleton. IMPRESSION: 1.  Aortic Atherosclerosis (ICD10-I70.0). Electronically Signed: By: Trudie Reedaniel  Entrikin M.D. On: 04/08/2019 15:40      Medications:     Scheduled Medications:  allopurinol  300 mg Oral Daily   aspirin  81 mg Oral Daily   cycloSPORINE  1 drop Both Eyes BID   diclofenac sodium  2 g Topical QID  isosorbide mononitrate  15 mg Oral Daily   metoprolol succinate  50 mg Oral Daily   pantoprazole  80 mg Oral Daily   simvastatin  10 mg Oral Daily   sodium chloride flush  3 mL Intravenous Once   torsemide  40 mg Oral QPM   torsemide  80 mg Oral q morning - 10a   Warfarin - Pharmacist Dosing Inpatient   Does not apply q1800     Infusions:   PRN Medications:  acetaminophen, benzonatate, diazepam, loratadine, nitroGLYCERIN, olopatadine   Assessment/Plan   1. Chest pain: Has had a chronic pattern of mild atypical chest pain.  However, she had a severe episode prior to admission after eating a few bites of dinner, lasted about 4 hrs and resolved  with NTG.  Hs-TnI was negative x 2 (6 and 7).  ECG showed no acute changes.  With prolonged chest pain (hours) and negative Hs-TnI, I suspect that her chest pain is most likely noncardiac, ?esophageal spasm as it happened while eating.  Coronary CT showed nonobstructive CAD.  - Continue ASA/warfarin and statin.  - No further ischemic workup.   - Daughter says she has a GI workup planned.  2. Atrial fibrillation: Chronic. Continue warfarin. Continue Toprol XL 50 mg daily.  4. Mechanical mitral valve: Continue ASA 81 daily and coumadin with INR goal 2.5-3.5. Mechanical MV looked ok on echo this admission.  5. Diastolic CHF:She got IV Lasix yesterday, volume status ok this morning.  Echo was stable compared to the past.  - Transition back to home torsemide 80 qam/40 qpm.   Stable for discharge from my standpoint.   Length of Stay: 1  Loralie Champagne, MD  04/09/2019, 10:04 AM  Advanced Heart Failure Team Pager [phone] (M-F; 7a - 4p)  Please contact [facility] for night-coverage after hours (4p -7a ) and weekends on [email or web address]

## 2019-04-09 NOTE — Progress Notes (Signed)
ANTICOAGULATION CONSULT NOTE - Follow Up Consult  Pharmacy Consult for warfarin Indication: Hx of mechanical MVR and atrial fibrillation  Allergies  Allergen Reactions  . Promethazine Hcl Other (See Comments)    REACTION: made her real shaky    Patient Measurements: Height: 4\' 11"  (149.9 cm) Weight: 144 lb (65.3 kg)(scale c) IBW/kg (Calculated) : 43.2  Vital Signs: Temp: 97.8 F (36.6 C) (08/13 0815) Temp Source: Oral (08/13 0815) BP: 113/76 (08/13 0932) Pulse Rate: 74 (08/13 0932)  Labs: Recent Labs    04/07/19 2125 04/08/19 0004 04/08/19 0309 04/08/19 1039 04/09/19 0556 04/09/19 0833  HGB 10.8*  --  10.5*  --   --   --   HCT 35.1*  --  33.6*  --   --   --   PLT 175  --  154  --   --   --   LABPROT 26.9*  --   --  26.7* 26.1*  --   INR 2.5*  --   --  2.5* 2.4*  --   CREATININE 1.29*  --  1.21*  --   --  1.44*  TROPONINIHS 6 7  --   --   --   --     Estimated Creatinine Clearance: 31.5 mL/min (A) (by C-G formula based on SCr of 1.44 mg/dL (H)).   Medications:  Medications Prior to Admission  Medication Sig Dispense Refill Last Dose  . acetaminophen (TYLENOL) 500 MG tablet Take 500-1,000 mg by mouth every 6 (six) hours as needed for fever (pain).   Past Month at Unknown time  . allopurinol (ZYLOPRIM) 300 MG tablet TAKE one tablet BY MOUTH EVERY DAY (Patient taking differently: Take 300 mg by mouth daily. ) 30 tablet 11 04/07/2019 at Unknown time  . aspirin 81 MG chewable tablet Chew 81 mg by mouth daily.   04/07/2019 at Unknown time  . cyanocobalamin 500 MCG tablet Take 1 tablet (500 mcg total) by mouth daily. 30 tablet 5 04/07/2019 at Unknown time  . cycloSPORINE (RESTASIS) 0.05 % ophthalmic emulsion Place 1 drop into both eyes 2 (two) times daily.   Past Week at Unknown time  . diazepam (VALIUM) 5 MG tablet Take 5 mg by mouth every 8 (eight) hours as needed for anxiety.    Past Week at Unknown time  . diclofenac sodium (VOLTAREN) 1 % GEL Apply 2 g topically 4 (four)  times daily. 100 g 3 04/07/2019 at Unknown time  . folic acid (FOLVITE) 1 MG tablet TAKE 1 TABLET BY MOUTH EVERY DAY (Patient taking differently: Take 1 mg by mouth daily. ) 90 tablet 1 04/07/2019 at Unknown time  . isosorbide mononitrate (IMDUR) 60 MG 24 hr tablet Take 60 mg by mouth daily.   04/07/2019 at Unknown time  . KLOR-CON M20 20 MEQ tablet TAKE 2 TABLETS BY MOUTH TWICE A DAY (Patient taking differently: Take 40 mEq by mouth 2 (two) times daily. ) 360 tablet 0 04/07/2019 at Unknown time  . loratadine (CLARITIN) 10 MG tablet Take 10 mg by mouth daily as needed for allergies.    Past Week at Unknown time  . Menthol-Methyl Salicylate (TIGER BALM LINIMENT EX) Apply 1 application topically daily.   04/07/2019 at Unknown time  . metFORMIN (GLUCOPHAGE) 500 MG tablet Take 1 tablet (500 mg total) by mouth daily with breakfast. (Patient taking differently: Take 500 mg by mouth 2 (two) times daily with a meal. ) 120 tablet 0 04/07/2019 at Unknown time  . metoprolol succinate (TOPROL-XL) 50  MG 24 hr tablet TAKE 1 TABLET BY MOUTH EVERY DAY (Patient taking differently: Take 50 mg by mouth daily. ) 90 tablet 2 04/07/2019 at 0800  . nitroGLYCERIN (NITROSTAT) 0.4 MG SL tablet Place 0.4 mg under the tongue every 5 (five) minutes as needed for chest pain.   04/07/2019 at Unknown time  . Olopatadine HCl (PAZEO) 0.7 % SOLN Place 1 drop into both eyes daily as needed (itching/ irritation).   Past Week at Unknown time  . omeprazole (PRILOSEC) 20 MG capsule TAKE ONE CAPSULE BY MOUTH TWICE A DAY BEFORE MEALS. INSURANCE ONLY COVERS 1 A DAY (Patient taking differently: Take 20 mg by mouth 2 (two) times daily before a meal. Take with omeprazole 40 mg to equal 60 mg) 60 capsule 1 04/07/2019 at Unknown time  . omeprazole (PRILOSEC) 40 MG capsule Take 1 capsule PO BID before meals (Patient taking differently: Take 40 mg by mouth 2 (two) times daily. Take with Omeprazole 20 mg to equal 60 mg) 60 capsule 0 04/07/2019 at Unknown time  .  simvastatin (ZOCOR) 10 MG tablet TAKE 1 TABLET BY MOUTH EVERY DAY (Patient taking differently: Take 10 mg by mouth daily at 6 PM. ) 90 tablet 1 04/07/2019 at Unknown time  . torsemide (DEMADEX) 20 MG tablet TAKE 4 TABLETS EACH MORNING AND 2 TABLETS EACH EVENING (Patient taking differently: Take 20-80 mg by mouth See admin instructions. TAKE 4 TABLETS EACH MORNING AND 1 TABLET EACH EVENING) 180 tablet 2 04/07/2019 at Unknown time  . warfarin (COUMADIN) 2 MG tablet TAKE 1/2-1 TABLET BY MOUTH EVERY DAY  OR AS DIRECTED BY COUMADIN CLINIC NEED APPOINTMENT WITH DR Aundra Dubin [phone] (Patient taking differently: Take 1-2 mg by mouth See admin instructions. Take 1/2 tablet on Monday, Wednesday, Thursday and Saturday then take 1 tablet all the other days) 30 tablet 0 04/07/2019 at 2000  . ACCU-CHEK SOFTCLIX LANCETS lancets USE AS DIRECTED TO test blood sugar 100 each 6   . glucose blood (ACCU-CHEK AVIVA PLUS) test strip USE TO CHECK BLOOD SUGAR ONCE DAILY. Dx code: E11.9 100 strip 12     Assessment: 68 yo female admitted with chest pain. Pharmacy consulted to dose warfarin. Patient is on warfarin PTA for history of mechanical mitral valve replacement and chronic AFib. Last dose of warfarin 1mg  at 1917 on 8/12. INR 2.4 at 0600 and is subtherapeutic. Hgb stable at 10.5. Plt wnl. No reported bleeding.   PTA warfarin dose: 2 mg on Tues/Friday/Sun, 1 mg on all other days.  Goal of Therapy:  INR 2.5-3.5 Monitor platelets by anticoagulation protocol: Yes   Plan:  -Warfarin 2 mg po x1 -Monitor daily INR, CBC, and S/S of bleeding  Julieta Bellini, PharmD Candidate 04/09/2019,9:50 AM

## 2019-04-09 NOTE — Discharge Instructions (Signed)
Thank you for allowing [place] to participate in your care!    You were admitted for chest pain. Cardiology performed a CT scan of your heart and determined you did not need any further work up. They do recommend you continue your follow up with Gastroenterology.  You should also:  Continue warfarin  Continue Toprol XL 50 mg daily  Continue ASA 81  Continue torsemide 80 qam/40 qpm.   If you experience worsening of your admission symptoms, develop shortness of breath, life threatening emergency, suicidal or homicidal thoughts you must seek medical attention immediately by calling 911 or calling your MD immediately  if symptoms less severe.

## 2019-04-09 NOTE — Progress Notes (Signed)
Family Medicine Teaching Service Daily Progress Note Intern Pager: [phone]  Patient name: Deborah [name] Medical record number: [id number] Date of birth: 01/03/1951 Age: 68 y.o. Gender: female  Primary Care Provider: Leeroy BockAnderson, Chelsey L, DO Consultants: IP CONSULT TO FAMILY PRACTICE WARFARIN (COUMADIN) PER PHARMACY CONSULT Code Status: Full Code   Pt Overview and Major Events to Date:  Hospital Day: 3 04/07/2019: admitted for Chest Pain  Assessment and Plan: Deborah [name] is a 68 y.o. female who presented w/ chest pain .  PMHx s/f A. Fib on Coumadin, rheumatic heart disease status post MV replacement and TV repair, HTN, HLD, gout, GERD, NIDDM, anxiety  #ACS rule out #Unstable Angina Patient presenting with chest pain with exertion that improves with rest and chest pain with radiation of her left arm with eating associated with diaphoresis and shortness of breath.  Initial work-up significant for troponin VI-VII.  BNP 144, hemoglobin 10.8, INR 2.5.  On chest x-ray there were no acute findings but some potential chronic costophrenic angle blunting compared to previous radiographs.  On EKG, patient has A. fib with Q waves noted in 3 and aVF as previously seen but new T wave inversions and flattening in 3, aVF, V4 to 6.  Repeat EKG this morning significant for prolonged QTC to 520 in A. fib.  Consulted cards after this morning and they will see her to determine if she is a candidate for Cath Lab given her past cardiac history and presentation.  Should also consider other GI work-up as this could be part of her chest pain.  Patient is due for upper and lower endoscopies.  Coronary CT showed mild CAD.  CAD RADS = 2.  Patient coronary artery calcium score is in the 77th percentile. Of note, patient also had an episode of nausea vomiting yesterday after receiving morphine. This likely due to medication reaction then a result of angina.  Patient denies any nausea or vomiting since that time patient  reports the pain is with inspiration and does not occur when she is sitting still indicating that it may be musculoskeletal related.  Cardiology recs appreciated.  Will follow for further recommendations given no input since cardiac cath.  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  Ejection fraction 55% Left and right atrium severely enlarged, left in right ventricles mildly enlarged.  No significant differences in blood pressures in the upper extremities.  Echo 8/12 showed left ventricle with normal systolic function ejection fraction 60 to 65%.  Right ventricle also had normal systolic function with a mildly enlarged cavity.  Tricuspid valve is abnormal status post repair..  Vena cava is dilated in size with greater than 50% respiratory variability..  Continue home Toprol XL, simvastatin, torsemide, ASA  Hold home KDUR  Restart torsemide 80 every morning/40 every afternoon  #CKD 3 Stable with creatinine at baseline  Avoid nephrotoxic medications  #A. fib on Coumadin Mechanical mitral valve.  INR goal 2.5-3.5.  Patient will likely need bridging with heparin should she need to go to Cath Lab or undergo any other procedures.  INR this morning is 2.4.  P thrombin time is 26.1 patient's heart rate stable in 70s.  Continue warfarin dosing  per pharmacy  Continue to monitor daily INR, CBC, and S/S of bleeding  Continue Toprol-XL  #NASH Most recent CT in September 03, 2018.  No suspicious hepatic lesions and focal fat adjacent to the falciform ligament.  Biliary system unremarkable.  Reticular opacities within the lung bases suggestive of fibrosis but no pulmonary  nodules were identified.  LFTs within normal limits with mild indirect hyperbilirubinemia which is chronic.   Fibrotic lung on abdominal CT Given severely enlarged right atrium, could consider high right atrial pressures secondary to a fibrotic lung disease.  Consider right-sided heart cath  Further pulmonary work-up if patient reports shortness of breath  #HLD Continue home simvastatin 10 mg  Follow-up lipid panel  #Non-insulin-dependent diabetes Most recent A1c in May 2019 is 6.0.  Will repeat A1c today.  At home, patient is on metformin.  In previous notes, discussed taking patient off of metformin due to at goal A1c's.  Should discuss at discharge for discharge follow-up.  Holding home metformin  Add sliding scale with meals if glucoses > 140  #Anxiety Patient takes 5 mg of diazepam every 8 hours as needed anxiety.  Last fill date per PDMP was 11/19/2018 with #90.  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  Monitor hemoglobin  #Gout  Continue home allopurinol  #GERD   Holding home omeprazole as above  #FEN/GI:  . Fluids: KVO . Electrolytes: wnl  . Nutrition: NPO   Access: L PIV & purwick VTE prophylaxis: Chronically Anticoagulated   Disposition: Home Pending PT/OT recommendations   Subjective:  Patient doing well this morning with daughter at bedside.  Reports she slept very well.  Reports continued pain along left ribs and in center of chest with inspiration.  Says that it is not present when she is not breathing in.  Denies any tenderness to palpation.  Denies anginal chest pain, nausea, vomiting, headaches.  Reports occasional shortness of breath.  Objective: Temp:  [97.6 F (36.4 C)-98.3 F (36.8 C)] 98.1 F (36.7 C) (08/13 0355) Pulse Rate:  [42-93] 60 (08/13 0355) Cardiac Rhythm: Atrial fibrillation;Other (Comment)  (08/12 1941) Resp:  [12-25] 18 (08/13 0004) BP: (94-152)/(60-96) 116/71 (08/13 0355) SpO2:  [90 %-97 %] 94 % (08/13 0355) Weight:  [65.1 kg-65.3 kg] 65.3 kg (08/13 0355) Intake/Output      08/12 0701 - 08/13 0700 08/13 0701 - 08/14 0700   P.O. 720    Total Intake(mL/kg) 720 (11)    Urine (mL/kg/hr) 800 (0.5)    Total Output 800    Net -80            General: Alert and cooperative lying comfortably in bed.  Daughter is in the room for evaluation HEENT: Neck non-tender without lymphadenopathy, masses or thyromegaly Cardio: Normal S1 and S2, no S3 or S4.  Rhythm is irregularly irregular. No murmurs or rubs.   Pulm: Clear to auscultation bilaterally, no crackles, wheezing, or diminished breath sounds. Normal respiratory effort Abdomen: Bowel sounds normal. Abdomen soft and non-tender.  Extremities: No peripheral edema. Warm/ well perfused.  Strong radial and pedal pulses Neuro: Cranial nerves grossly intact  Laboratory: I have personally read and reviewed all labs and imaging studies.  CBC: Recent Labs  Lab 04/07/19 2125 04/08/19 0309  WBC 7.5 9.7  HGB 10.8* 10.5*  HCT 35.1* 33.6*  MCV 77.1* 76.5*  PLT 175 154   CMP: Recent Labs  Lab  04/07/19 2125 04/08/19 0043 04/08/19 0309  NA 139  --  140  K 4.1  --  4.8  CL 104  --  105  CO2 25  --  24  GLUCOSE 256*  --  221*  BUN 18  --  17  CREATININE 1.29*  --  1.21*  CALCIUM 9.0  --  9.0  ALBUMIN  --  4.2  --     Micro: Covid neg   Imaging/Diagnostic Tests: Dg Chest 2 View  Result Date: 04/07/2019 CLINICAL DATA:  Chest pain EXAM: CHEST - 2 VIEW COMPARISON:  07/15/2018 FINDINGS: Cardiac shadow is mildly enlarged. Postsurgical changes are noted. Blunting of the left costophrenic angle is noted stable from the prior exam. No focal infiltrate or sizable effusion is seen. Valvular replacement is noted. IMPRESSION: No acute abnormality noted. Electronically Signed   By: Alcide CleverMark  Lukens M.D.   On: 04/07/2019 21:43   Ct Coronary  Morph W/cta Cor W/score W/ca W/cm &/or Wo/cm  Addendum Date: 04/08/2019   ADDENDUM REPORT: 04/08/2019 21:21 HISTORY: Chest pain.  Hx of mechanical MVR EXAM: Cardiac/Coronary  CT TECHNIQUE: The patient was scanned on a [employer] Force scanner. PROTOCOL: A 120 kV retrospective scan was triggered in the descending thoracic aorta at 111 HU's. Axial non-contrast 3 mm slices were carried out through the heart. The data set was analyzed on a dedicated work station and scored using the Agatson method. Gantry rotation speed was 250 msecs and collimation was .6 mm. Beta blockade and 0.8 mg of sl NTG was given. The 3D data set was reconstructed in 10% intervals of the 0-90 % of the R-R cycle. Diastolic and systolic phases were analyzed on a dedicated work station using MPR, MIP and VRT modes. The patient received 80mL OMNIPAQUE IOHEXOL 350 MG/ML SOLN of contrast. FINDINGS: Coronary calcium score: The patient's coronary artery calcium score is 63, which places the patient in the 77th percentile. Coronary arteries: Normal coronary origins.  Right dominance. Significant motion artifact from arrhythmia degrades the diagnostic accuracy of this exam. Right Coronary Artery: Large caliber dominant RCA with minimal mixed atherosclerotic plaque in the mid-RCA, <25% stenosis. Left Main Coronary Artery: No detectable plaque or stenosis. Left Anterior Descending Coronary Artery: Mild mixed atherosclerotic plaque in the proximal LAD, 25-49% stenosis. Mild atherosclerotic plaque in the mid LAD, 25-49% stenosis. Distal vessel not well visualized due to arrhythmia artifact but appears patent. Left Circumflex Artery: Minimal mixed atherosclerotic plaque in the proximal circumflex artery <25% stenosis. Distal vessel is diminutive and not well visualized due to arrhythmia artifact. Aorta: Normal size, 27 mm at the mid ascending aorta (level of the PA bifurcation) measured double oblique. No calcifications. No dissection. Aortic Valve: No  calcifications. Mitral valve: Normal leaflet excursion, no evidence of pannus or thrombus seen. Tricuspid valve: S/p tricuspid valve annuloplasty ring for repair. Left atrium: Severe left atrial enlargement. Left atrial appendage: Cannot exclude thrombus vs. Incomplete contrast opacification in the left atrial appendage. No comparison cross sectional imaging available for review. Other findings: Normal pulmonary vein drainage into the left atrium. Normal size of the pulmonary artery. IMPRESSION: 1. Mild CAD, CADRADS = 2. 2. The patient's coronary artery calcium score is 63, which places the patient in the 77th percentile. 3. Normal coronary origin with right dominance. 4. Cannot exclude left atrial appendage thrombus vs incomplete contrast opacification of the LA appendage. Consider TEE vs repeat gated CTA with delay sequence or prone imaging for further clarification if clinically indicated. 5. Normally functioning mitral valve  mechanical prosthesis, normal leaflet excursion on cine images. 6.  S/p tricuspid valve repair. Electronically Signed   By: Cherlynn Kaiser   On: 04/08/2019 21:21   Result Date: 04/08/2019 EXAM: OVER-READ INTERPRETATION  CT CHEST The following report is an over-read performed by radiologist Dr. Vinnie Langton of [facility], [place] on 04/08/2019. This over-read does not include interpretation of cardiac or coronary anatomy or pathology. The coronary calcium score/coronary CTA interpretation by the cardiologist is attached. COMPARISON:  None. FINDINGS: Aortic atherosclerosis. Within the visualized portions of the thorax there are no suspicious appearing pulmonary nodules or masses, there is no acute consolidative airspace disease, no pleural effusions, no pneumothorax and no lymphadenopathy. Visualized portions of the upper abdomen are unremarkable. There are no aggressive appearing lytic or blastic lesions noted in the visualized portions of the skeleton. IMPRESSION: 1.  Aortic  Atherosclerosis (ICD10-I70.0). Electronically Signed: By: Vinnie Langton M.D. On: 04/08/2019 15:40    EKG Interpretation  Date/Time:  Tuesday April 07 2019 21:53:04 EDT Ventricular Rate:  80 PR Interval:    QRS Duration: 100 QT Interval:  449 QTC Calculation: 518 R Axis:   107 Text Interpretation:  Atrial fibrillation Right axis deviation Prolonged QT interval No acute changes Confirmed by Orpah Greek [phone]) on 04/08/2019 5:13:35 AM        Gifford Shave, MD 04/09/2019, 7:49 AM PGY-1, [place] Intern pager: [phone], text pages welcome

## 2019-04-09 NOTE — Progress Notes (Signed)
Discharge instruction and medication information was given to patient and family. All belongings was given to pt. She walked  with her daughter to the hospital main entrance.

## 2019-04-10 NOTE — Telephone Encounter (Signed)
Reviewing on behalf of Dr. Ouida Sills who is out of the office right now. It appears patient did not ever actually come in and have labs drawn last week so I am not sure exactly what labs she is referring to. In the interim she has been admitted and discharged from the hospital. Will close this encounter.  Leeanne Rio, MD

## 2019-04-11 NOTE — Discharge Summary (Signed)
[facility] Discharge Summary  Patient name: Deborah [name] Medical record number: [id number] Date of birth: [birth year] Age: 68 y.o. 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  Daughter is in the room for evaluation HEENT: Neck non-tender without lymphadenopathy, masses or thyromegaly Cardio: Normal S1 and S2, no S3 or S4.  Rhythm is irregularly irregular. No murmurs or rubs.   Pulm: Clear to auscultation bilaterally, no crackles, wheezing, or diminished breath sounds. Normal respiratory effort Abdomen: Bowel sounds normal. Abdomen soft and non-tender.  Extremities: No peripheral edema. Warm/ well perfused.  Strong radial and pedal pulses Neuro: Cranial nerves grossly intact  Brief Hospital Course:  Patient presented with substernal chest pain at rest and with exertion.  Patient had had intermittent chest pain that she described as sharp stabbing which resolved after a few minutes.  She first noticed it during the walk the morning prior to her admission.  She noticed it again while eating dinner.  She also had associated radiation down her arm and felt diaphoretic and short of breath.  Patient had an EKG which showed A. fib with T wave inversions in lead III and  flattening of V3 through 6, old Q waves in lead III and aVF.  Patient also had troponins trended.  High-sensitivity troponin was 6.  Chest x-ray on admission showed mild blunting of the costophrenic angles.  CAD RADS = 2.  When she was admitted she did receive some morphine and became nauseous and vomited multiple times.  Patient and her daughter both believe that was associated with receiving morphine.  Cardiology was consulted and they recommended a repeat cardiac echo and coronary CTA.  Echo showed left ventricle with normal systolic function.  LVEF of 60 to 65%.  Right ventricle also had normal systolic function with a mildly enlarged cavity.  Tricuspid valve was abnormal status post repair.  Vena cava is dilated in size with greater than 50% respiratory variability.  Coronary CT showed a calcium score in the 77th percentile.  Patient continued to have left-sided pain which appeared to be musculoskeletal due to the fact that occurred with motion such as breathing and.  Patient was given lidocaine patches which she reports helped a little but not a lot.  Issues for Follow Up:  1. Cardiology follow-up to address unstable angina 2. Consider GI follow-up to evaluate for worsening GERD.  Patient is overdue for endoscopies.  Significant Procedures: 8/12-cardiac echo 8/12- coronary CT  Significant Labs and Imaging:  Recent Labs  Lab 04/07/19 2125 04/08/19 0309  WBC 7.5 9.7  HGB 10.8* 10.5*  HCT 35.1* 33.6*  PLT 175 154   Recent Labs  Lab 04/07/19 2125 04/08/19 0043 04/08/19 0309 04/09/19 0833  NA 139  --  140 136  K 4.1  --  4.8 4.3  CL 104  --  105 100  CO2 25  --  24 23  GLUCOSE 256*  --  221* 240*  BUN 18  --  17 20  CREATININE 1.29*  --  1.21* 1.44*  CALCIUM 9.0  --  9.0 9.1  ALKPHOS  --  87  --   --   AST  --  29  --   --   ALT  --  28  --   --   ALBUMIN  --  4.2  --   --     Dg Chest 2 View  Result Date: 04/07/2019 CLINICAL DATA:  Chest pain EXAM: CHEST - 2 VIEW  COMPARISON:  07/15/2018 FINDINGS: Cardiac shadow is mildly enlarged. Postsurgical changes are noted. Blunting of the left costophrenic angle is noted stable from the prior exam. No focal infiltrate or sizable effusion is seen. Valvular replacement is noted. IMPRESSION: No acute abnormality noted. Electronically Signed   By: Alcide CleverMark  Lukens M.D.   On: 04/07/2019 21:43   Ct Coronary Morph W/cta Cor W/score W/ca W/cm &/or Wo/cm  Addendum Date: 04/08/2019   ADDENDUM REPORT: 04/08/2019 21:21 HISTORY: Chest pain.  Hx of mechanical MVR EXAM: Cardiac/Coronary  CT TECHNIQUE: The patient was scanned on a [employer] Force scanner. PROTOCOL: A 120 kV retrospective scan was triggered in the descending thoracic aorta at 111 HU's. Axial non-contrast 3 mm slices were carried out through the heart. The data set was analyzed on a dedicated work station and scored using the Agatson method. Gantry rotation speed was 250 msecs and collimation was .6 mm. Beta blockade and 0.8 mg of sl NTG was given. The 3D data set was reconstructed in 10% intervals of the 0-90 % of the R-R cycle. Diastolic and systolic phases were analyzed on a dedicated work station using MPR, MIP and VRT modes. The patient received 80mL OMNIPAQUE IOHEXOL 350 MG/ML SOLN of contrast. FINDINGS: Coronary calcium score: The patient's coronary artery calcium score is 63, which places the patient in the 77th percentile. Coronary arteries: Normal coronary origins.  Right dominance. Significant motion artifact from arrhythmia degrades the diagnostic accuracy of this exam. Right Coronary Artery: Large caliber dominant RCA with minimal mixed atherosclerotic plaque in the mid-RCA, <25% stenosis. Left Main Coronary Artery: No detectable plaque or stenosis. Left Anterior Descending Coronary Artery: Mild mixed atherosclerotic plaque in the proximal LAD, 25-49% stenosis. Mild atherosclerotic plaque in the mid LAD, 25-49% stenosis. Distal vessel not well visualized due to arrhythmia  artifact but appears patent. Left Circumflex Artery: Minimal mixed atherosclerotic plaque in the proximal circumflex artery <25% stenosis. Distal vessel is diminutive and not well visualized due to arrhythmia artifact. Aorta: Normal size, 27 mm at the mid ascending aorta (level of the PA bifurcation) measured double oblique. No calcifications. No dissection. Aortic Valve: No calcifications. Mitral valve: Normal leaflet excursion, no evidence of pannus or thrombus seen. Tricuspid valve: S/p tricuspid valve annuloplasty ring for repair. Left atrium: Severe left atrial enlargement. Left atrial appendage: Cannot exclude thrombus vs. Incomplete contrast opacification in the left atrial appendage. No comparison cross sectional imaging available for review. Other findings: Normal pulmonary vein drainage into the left atrium. Normal size of the pulmonary artery. IMPRESSION: 1. Mild CAD, CADRADS = 2. 2. The patient's coronary artery calcium score is 63, which places the patient in the 77th percentile. 3. Normal coronary origin with right dominance. 4. Cannot exclude left atrial appendage thrombus vs incomplete contrast opacification of the LA appendage. Consider TEE vs repeat gated CTA with delay sequence or prone imaging for further clarification if clinically indicated. 5. Normally functioning mitral valve mechanical prosthesis, normal leaflet  excursion on cine images. 6.  S/p tricuspid valve repair. Electronically Signed   By: Weston BrassGayatri  Acharya   On: 04/08/2019 21:21   Result Date: 04/08/2019 EXAM: OVER-READ INTERPRETATION  CT CHEST The following report is an over-read performed by radiologist Dr. Trudie Reedaniel Entrikin of [facility] on 04/08/2019. This over-read does not include interpretation of cardiac or coronary anatomy or pathology. The coronary calcium score/coronary CTA interpretation by the cardiologist is attached. COMPARISON:  None. FINDINGS: Aortic atherosclerosis. Within the visualized portions of the  thorax there are no suspicious appearing pulmonary nodules or masses, there is no acute consolidative airspace disease, no pleural effusions, no pneumothorax and no lymphadenopathy. Visualized portions of the upper abdomen are unremarkable. There are no aggressive appearing lytic or blastic lesions noted in the visualized portions of the skeleton. IMPRESSION: 1.  Aortic Atherosclerosis (ICD10-I70.0). Electronically Signed: By: Trudie Reedaniel  Entrikin M.D. On: 04/08/2019 15:40     Results/Tests Pending at Time of Discharge: NA  Discharge Medications:  Allergies as of 04/09/2019      Reactions   Promethazine Hcl Other (See Comments)   REACTION: made her real shaky      Medication List    TAKE these medications   Accu-Chek Aviva Plus test strip Generic drug: glucose blood USE TO CHECK BLOOD SUGAR ONCE DAILY. Dx code: E11.9   Accu-Chek Softclix Lancets lancets USE AS DIRECTED TO test blood sugar   acetaminophen 500 MG tablet Commonly known as: TYLENOL Take 500-1,000 mg by mouth every 6 (six) hours as needed for fever (pain).   allopurinol 300 MG tablet Commonly known as: ZYLOPRIM TAKE one tablet BY MOUTH EVERY DAY   aspirin 81 MG chewable tablet Chew 81 mg by mouth daily.   cycloSPORINE 0.05 % ophthalmic emulsion Commonly known as: RESTASIS Place 1 drop into both eyes 2 (two) times daily.   diazepam 5 MG tablet Commonly known as: VALIUM Take 5 mg by mouth every 8 (eight) hours as needed for anxiety.   diclofenac sodium 1 % Gel Commonly known as: Voltaren Apply 2 g topically 4 (four) times daily.   folic acid 1 MG tablet Commonly known as: FOLVITE TAKE 1 TABLET BY MOUTH EVERY DAY   isosorbide mononitrate 30 MG 24 hr tablet Commonly known as: IMDUR Take 0.5 tablets (15 mg total) by mouth daily. What changed: Another medication with the same name was removed. Continue taking this medication, and follow the directions you see here.   Klor-Con M20 20 MEQ tablet Generic drug:  potassium chloride SA TAKE 2 TABLETS BY MOUTH TWICE A DAY What changed: how much to take   loratadine 10 MG tablet Commonly known as: CLARITIN Take 10 mg by mouth daily as needed for allergies.   metFORMIN 500 MG tablet Commonly known as: GLUCOPHAGE Take 1 tablet (500 mg total) by mouth daily with breakfast. What changed: when to take this   metoprolol succinate 50 MG 24 hr tablet Commonly known as: TOPROL-XL TAKE 1 TABLET BY MOUTH EVERY DAY   nitroGLYCERIN 0.4 MG SL tablet Commonly known as: NITROSTAT Place 0.4 mg under the tongue every 5 (five) minutes as needed for chest pain.   omeprazole 40 MG capsule Commonly known as: PRILOSEC Take 1 capsule PO BID before meals What changed:   how much to take  how to take this  when to take this  additional instructions   omeprazole 20 MG capsule Commonly known as: PRILOSEC TAKE ONE CAPSULE BY MOUTH TWICE A DAY BEFORE MEALS. INSURANCE ONLY COVERS  1 A DAY What changed: See the new instructions.   Pazeo 0.7 % Soln Generic drug: Olopatadine HCl Place 1 drop into both eyes daily as needed (itching/ irritation).   simvastatin 10 MG tablet Commonly known as: ZOCOR TAKE 1 TABLET BY MOUTH EVERY DAY What changed: when to take this   TIGER BALM LINIMENT EX Apply 1 application topically daily.   torsemide 20 MG tablet Commonly known as: DEMADEX Take 2 tablets (40 mg total) by mouth every evening. What changed: You were already taking a medication with the same name, and this prescription was added. Make sure you understand how and when to take each.   torsemide 20 MG tablet Commonly known as: DEMADEX Take 4 tablets (80 mg total) by mouth every morning. What changed: See the new instructions.   vitamin B-12 500 MCG tablet Commonly known as: CYANOCOBALAMIN Take 1 tablet (500 mcg total) by mouth daily.   warfarin 2 MG tablet Commonly known as: COUMADIN Take as directed. If you are unsure how to take this medication, talk  to your nurse or doctor. Original instructions: TAKE 1/2-1 TABLET BY MOUTH EVERY DAY  OR AS DIRECTED BY COUMADIN CLINIC NEED APPOINTMENT WITH DR Shirlee LatchMCLEAN [phone] What changed:   how much to take  how to take this  when to take this  additional instructions       Discharge Instructions: Please refer to Patient Instructions section of EMR for full details.  Patient was counseled important signs and symptoms that should prompt return to medical care, changes in medications, dietary instructions, activity restrictions, and follow up appointments.   Follow-Up Appointments: Follow-up Information    Dareen Pianonderson, Chelsey L, DO. Go on 04/14/2019.   Specialty: Family Medicine Why: @10 :10am with Dr.Beard,and please wear a mask Contact information: [address] [place] [address] [phone]           Derrel Nipresenzo, Victor, MD 04/11/2019, 9:08 AM PGY-1, [facility]

## 2019-04-14 ENCOUNTER — Other Ambulatory Visit: Payer: Self-pay

## 2019-04-14 ENCOUNTER — Encounter: Payer: Self-pay | Admitting: Family Medicine

## 2019-04-14 ENCOUNTER — Ambulatory Visit (INDEPENDENT_AMBULATORY_CARE_PROVIDER_SITE_OTHER): Payer: Medicare Other | Admitting: Family Medicine

## 2019-04-14 VITALS — BP 98/62 | HR 62 | Wt 144.8 lb

## 2019-04-14 DIAGNOSIS — E119 Type 2 diabetes mellitus without complications: Secondary | ICD-10-CM

## 2019-04-14 DIAGNOSIS — R079 Chest pain, unspecified: Secondary | ICD-10-CM | POA: Diagnosis present

## 2019-04-14 DIAGNOSIS — E785 Hyperlipidemia, unspecified: Secondary | ICD-10-CM

## 2019-04-14 NOTE — Progress Notes (Signed)
Subjective:    Patient ID: Deborah [name], female    DOB: 07/13/1951, 68 y.o.   MRN: [id number]   CC: Hospital follow-up  HPI: Deborah [name] is a 68 year old female presenting discuss the following:  Hospital follow-up: Admitted from 8/13-8/15 for further investigation of sharp chest pain that occurred after eating dinner, lasted for 4 hours and resolved with morphine.  Tried her sublingual nitrox2 without relief during the episode.  While admitted, high-sensitivity troponins were negative, echo with preserved EF, and coronary CTA showing mild nonobstructive CAD.  Episode of chest pain was felt to be noncardiac per cardiology, consider esophageal spasm.  She does have a complex cardiac history including HFpEF, history of rheumatic heart disease with mitral valve replacement, TV repair, chronic atrial fibrillation, and a longstanding history of intermittent anginal/chest pain.  Additionally has risk factors including diabetes and hyperlipidemia, both of which are currently well controlled.    Since discharge she has been doing well, no further chest pain episodes.  She does on occasion have the left-sided chest pain for which has been on/off for the past several years, describes as a pressure without any reliable relation to physical activity or eating.  Follows closely with cardiology.  Denies any lightheadedness/dizziness, diaphoresis, shortness of breath, lower extremity swelling, orthopnea, dyspnea on exertion. She will be following up with [facility] in the next 1-2 months for further work-up including endoscopy and colonoscopy.  She does have GERD for which she has been on omeprazole long-term and additionally appears to have symptomatology of dyspepsia with epigastric abdominal pain chronically.  States she needs refills on her warfarin, omeprazole, metoprolol, K-Dur, and simvastatin--however note within her medication she is recently been prescribed all of these within the past month with  refills.   Smoking status reviewed-non-smoker  Review of Systems Per HPI   Patient Active Problem List   Diagnosis Date Noted  . Unstable angina (HCC) 04/08/2019  . Chest pain 04/08/2019  . Cough 07/11/2018  . Chronic pain of both ankles 09/30/2017  . Pain in joint involving ankle and foot 09/16/2017  . NAFLD (nonalcoholic fatty liver disease) 40/98/119110/03/2017  . Knee pain, bilateral 04/12/2017  . Low back pain without sciatica 06/12/2016  . Nonspecific chest pain   . Vision changes 02/21/2016  . RUQ pain 03/24/2015  . 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 Encounter for therapeutic drug monitoring 09/30/2013  . 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 H/O prosthetic heart valve 07/08/2013  . Eosinophilia 01/27/2013  . Microcytic anemia 05/01/2012  . 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 Postprocedural state 05/22/2011  . Diastolic CHF, chronic (HCC) 03/15/2011  . Rheumatic heart disease   . Chronic atrial fibrillation (HCC)   . Chronic anticoagulation   . DM (diabetes mellitus) (HCC)   . Hyperlipidemia   . Mitral valve disorder 11/16/2010  . GOUT 11/29/2009  . TRICUSPID REGURGITATION, MODERATE WITH MILD PULMONARY HTN 11/29/2009  . HYPERBILIRUBINEMIA 06/22/2009  . HYPERGLYCEMIA 06/21/2009  . Hyperlipidemia associated with type 2 diabetes mellitus (HCC) 01/07/2009  . GERD 01/07/2009  . CARPAL TUNNEL SYNDROME, LEFT 11/11/2008     Objective:  BP 98/62   Pulse 62   Wt 144 lb 12.8 oz (65.7 kg)   SpO2 98%   BMI 29.25 kg/m  Vitals and nursing note reviewed  General: NAD, pleasant Cardiac: Irregularly irregular rate and rhythm.  No murmurs or rubs appreciated. Mild tenderness to palpation of anterior chest.  Note previous sternotomy scar. Respiratory: CTAB, normal  effort, no crackles noted. Abdomen: soft, nontender, nondistended Extremities: No lower extremity edema bilaterally.  Palpable distal posterior tibialis pulses bilaterally. Skin: warm and  dry, no rashes noted Neuro: alert and oriented, no focal deficits Psych: normal affect  Assessment & Plan:   Chest pain Hospital follow-up, admitted 8/13-8/15 for ACS work-up, resolved with no further severe CP episodes.  Deemed to be noncardiac in origin, coronary CTA with mild nonobstructive CAD present, however cannot necessarily rule out a cardiac cause for long-term stable CP.  Currently optimized on cardiac regimen, hyperlipidemia and diabetes well controlled.  Differential remains broad, however must also continue to consider GI etiology.  Could consider esophageal spasm as initiated the event especially as it occurred after eating, and could also consider her longstanding GERD/dyspepsia for she appears to be still symptomatic (due to her atypical CP for the last several years) despite long-term PPI use.  Low concern for acute on chronic HFpEF as she appears euvolemic on exam and breathing comfortably. Valvular replacement stable.  - Encouraged routine follow-up with her cardiologist, Dr. Aundra Dubin - Proceed with further GI work-up at [facility] with endoscopy and colonoscopy - Continue home daily aspirin, Toprol, Imdur, simvastatin, torsemide  -- Return precautions discussed including recurrence of severe chest pain, SOB/dyspnea on exertion, weight gain (3 lbs in one day, 5 in 1 week)    DM (diabetes mellitus) A1c stable at 6.6 in 03/2019.  --Cont Metformin 500mg  BID   Hyperlipidemia LDL 88. ASCVD 10 year risk is 9.4%, should be on a high intensity statin, however has previously not tolerated doses higher than currently on due to myalgias.  --Cont simvastatin 10mg  nightly    Follow-up after GI, or sooner if needed.  [facility] Resident PGY-2

## 2019-04-14 NOTE — Assessment & Plan Note (Addendum)
Hospital follow-up, admitted 8/13-8/15 for ACS work-up, resolved with no further severe CP episodes.  Deemed to be noncardiac in origin, coronary CTA with mild nonobstructive CAD present, however cannot necessarily rule out a cardiac cause for long-term stable CP.  Currently optimized on cardiac regimen, hyperlipidemia and diabetes well controlled.  Differential remains broad, however must also continue to consider GI etiology.  Could consider esophageal spasm as initiated the event especially as it occurred after eating, and could also consider her longstanding GERD/dyspepsia for she appears to be still symptomatic (due to her atypical CP for the last several years) despite long-term PPI use.  Low concern for acute on chronic HFpEF as she appears euvolemic on exam and breathing comfortably. Valvular replacement stable.  - Encouraged routine follow-up with her cardiologist, Dr. Aundra Dubin - Proceed with further GI work-up at [facility] with endoscopy and colonoscopy - Continue home daily aspirin, Toprol, Imdur, simvastatin, torsemide  -- Return precautions discussed including recurrence of severe chest pain, SOB/dyspnea on exertion, weight gain (3 lbs in one day, 5 in 1 week)

## 2019-04-14 NOTE — Patient Instructions (Signed)
It was wonderful meeting you today.  I am glad that you have no longer had any similar chest pain to the one prior to your hospitalization.  Make sure you give Dr. Aundra Dubin a call to see if he can schedule an earlier appointment given that you already have the echo while in the hospital.  Additionally, please follow-up after you have your colonoscopy/endoscopy (stomach scopes) with GI over at [facility].  Of course, follow-up as needed for anything (including regular follow-up with warfarin clinic).  Your A1c and cholesterol look great.  If you experience any worsening chest pain, worsening shortness of breath, increasing lower extremity swelling, or weight gain (3 pounds in 1 day or 5 over the course of the week)-please follow-up or present to the ED.

## 2019-04-15 ENCOUNTER — Telehealth (HOSPITAL_COMMUNITY): Payer: Self-pay | Admitting: *Deleted

## 2019-04-15 NOTE — Assessment & Plan Note (Signed)
A1c stable at 6.6 in 03/2019.  --Cont Metformin 500mg  BID

## 2019-04-15 NOTE — Assessment & Plan Note (Signed)
LDL 88. ASCVD 10 year risk is 9.4%, should be on a high intensity statin, however has previously not tolerated doses higher than currently on due to myalgias.  --Cont simvastatin 10mg  nightly

## 2019-04-15 NOTE — Telephone Encounter (Signed)
Daughter Otila Kluver left vm to schedule hosp f/u with Dr.McLean called tina back at [phone] no answer. ;eft vm with appt information 8/25 at 1:40pm

## 2019-04-17 ENCOUNTER — Other Ambulatory Visit (HOSPITAL_COMMUNITY): Payer: Self-pay | Admitting: Cardiology

## 2019-04-21 ENCOUNTER — Other Ambulatory Visit: Payer: Self-pay

## 2019-04-21 ENCOUNTER — Encounter (HOSPITAL_COMMUNITY): Payer: Self-pay | Admitting: Cardiology

## 2019-04-21 ENCOUNTER — Ambulatory Visit (HOSPITAL_COMMUNITY)
Admission: RE | Admit: 2019-04-21 | Discharge: 2019-04-21 | Disposition: A | Discharge status: Home / Self Care | Payer: Medicare Other | Source: Ambulatory Visit | Attending: Cardiology | Admitting: Cardiology

## 2019-04-21 VITALS — BP 124/82 | HR 86 | Wt 147.4 lb

## 2019-04-21 DIAGNOSIS — Z79899 Other long term (current) drug therapy: Secondary | ICD-10-CM | POA: Diagnosis not present

## 2019-04-21 DIAGNOSIS — N189 Chronic kidney disease, unspecified: Secondary | ICD-10-CM | POA: Diagnosis not present

## 2019-04-21 DIAGNOSIS — E785 Hyperlipidemia, unspecified: Secondary | ICD-10-CM | POA: Insufficient documentation

## 2019-04-21 DIAGNOSIS — I2510 Atherosclerotic heart disease of native coronary artery without angina pectoris: Secondary | ICD-10-CM | POA: Insufficient documentation

## 2019-04-21 DIAGNOSIS — Z7984 Long term (current) use of oral hypoglycemic drugs: Secondary | ICD-10-CM | POA: Insufficient documentation

## 2019-04-21 DIAGNOSIS — I059 Rheumatic mitral valve disease, unspecified: Secondary | ICD-10-CM

## 2019-04-21 DIAGNOSIS — R7301 Impaired fasting glucose: Secondary | ICD-10-CM | POA: Insufficient documentation

## 2019-04-21 DIAGNOSIS — Z7982 Long term (current) use of aspirin: Secondary | ICD-10-CM | POA: Diagnosis not present

## 2019-04-21 DIAGNOSIS — Z7901 Long term (current) use of anticoagulants: Secondary | ICD-10-CM | POA: Diagnosis not present

## 2019-04-21 DIAGNOSIS — I130 Hypertensive heart and chronic kidney disease with heart failure and stage 1 through stage 4 chronic kidney disease, or unspecified chronic kidney disease: Secondary | ICD-10-CM | POA: Diagnosis not present

## 2019-04-21 DIAGNOSIS — I5032 Chronic diastolic (congestive) heart failure: Secondary | ICD-10-CM | POA: Diagnosis present

## 2019-04-21 DIAGNOSIS — M109 Gout, unspecified: Secondary | ICD-10-CM | POA: Insufficient documentation

## 2019-04-21 DIAGNOSIS — K219 Gastro-esophageal reflux disease without esophagitis: Secondary | ICD-10-CM | POA: Diagnosis not present

## 2019-04-21 DIAGNOSIS — I482 Chronic atrial fibrillation, unspecified: Secondary | ICD-10-CM | POA: Diagnosis not present

## 2019-04-21 DIAGNOSIS — D560 Alpha thalassemia: Secondary | ICD-10-CM | POA: Insufficient documentation

## 2019-04-21 DIAGNOSIS — Z952 Presence of prosthetic heart valve: Secondary | ICD-10-CM | POA: Insufficient documentation

## 2019-04-21 DIAGNOSIS — K760 Fatty (change of) liver, not elsewhere classified: Secondary | ICD-10-CM | POA: Diagnosis not present

## 2019-04-21 LAB — BASIC METABOLIC PANEL
Anion gap: 10 (ref 5–15)
BUN: 17 mg/dL (ref 8–23)
CO2: 23 mmol/L (ref 22–32)
Calcium: 8.8 mg/dL — ABNORMAL LOW (ref 8.9–10.3)
Chloride: 105 mmol/L (ref 98–111)
Creatinine, Ser: 1.24 mg/dL — ABNORMAL HIGH (ref 0.44–1.00)
GFR calc Af Amer: 52 mL/min — ABNORMAL LOW (ref >60)
GFR calc non Af Amer: 45 mL/min — ABNORMAL LOW (ref >60)
Glucose, Bld: 116 mg/dL — ABNORMAL HIGH (ref 70–99)
Potassium: 3.5 mmol/L (ref 3.5–5.1)
Sodium: 138 mmol/L (ref 135–145)

## 2019-04-21 LAB — BRAIN NATRIURETIC PEPTIDE: B Natriuretic Peptide: 146.9 pg/mL — ABNORMAL HIGH (ref 0.0–100.0)

## 2019-04-21 MED ORDER — TORSEMIDE 20 MG PO TABS: ORAL_TABLET | ORAL | 6 refills | Status: DC

## 2019-04-21 MED ORDER — ROSUVASTATIN CALCIUM 10 MG PO TABS: 10.0000 mg | ORAL_TABLET | Freq: Every day | ORAL | 3 refills | Status: DC

## 2019-04-21 NOTE — Progress Notes (Signed)
Patient ID: Deborah [name], female   DOB: 12/27/1950, 68 y.o.   MRN: [id number] PCP: Dr. Beryle Flock Cardiology: Dr. [name]  68 y.o. with rheumatic heart disease and MV replacement/TV repair, chronic atrial fibrillation, and chronic diastolic CHF.  Echo in 4/18 showed EF 60-65% with normally functioning mechanical mitral valve.    She is seen periodically also by a cardiologist at an outside facility.   She was admitted in 8/20 with atypical chest pain and mild volume overload (main complaint was chest pain).  Troponin negative.  Coronary CTA was done, showing nonobstructive mild CAD.  Echo showed EF 60-65%, moderate LVH, mildly dilated RV with normal systolic function, s/p TV repair with mild TR, mild AI, mechanical mitral valve with mean gradient 5 mmHg.    She returns for followup of CHF. She has not had any further chest pain.  She has been trying to walk for 30 minutes around her house, she develops mild dyspnea during her walk.  No orthopnea/PND.  No lightheadedness.  No BRBPR/melena.    Labs (9/17): LDL 87, HDL 61 Labs (12/17): HCT 36.7 Labs (1/18): K 3.7, creatinine 1.09, TSH normal, hgb 11.7 Labs (4/18): K 4.1, creatinine 1.37, hgb 11.9 Labs (11/18): K 4.3, creatinine 1.5, tbili 1.2, ALT/AST normal Labs (12/18): LDL 91, HDL 55, hgb 10.9, K 3.7, creatinine 1.22 Labs (3/19): K 3.9, creatinine 1.35 Labs (8/20: K 4.3, creatinine 1.4, LDL 88  PMH: 1. Rheumatic heart disease: Status post mechanical MV replacement and tricuspid repair at [facility] in 2002.  Echo 5/12 with EF 60-65%, mild AI, mechanical MV prosthesis with normal function, severe LAE, moderate RAE, moderate to severe TR, mildly elevated PA systolic pressure.  Echo (6/15) EF 55-60%, normal RV, normal mechanical mitral valve, severe LAE, moderate TR.  Echo (9/16) with EF 55-60%, mild to moderate AI, mechanical mitral valve looks ok, s/p TV repair with mild TR, PASP 40.  - Echo (4/18): EF 60-65%, mechanical mitral valve functioned  normally, mild AI, repaired tricuspid valve with mild TR, no TS; PASP 33 mmHg.  - Echo (8/20): EF 60-65%, moderate LVH, mildly dilated RV with normal systolic function, s/p TV repair with mild TR, mild AI, mechanical mitral valve with mean gradient 5 mmHg (normal function).   2. Chronic atrial fibrillation on coumadin.  Event monitor (7/12-8/12) showed no high rate episodes (patient continuously in atrial fibrillation).  3. Chronic diastolic CHF 4. Chest pain: ETT-myoview (7/12): No evidence for ischemia or infarction.  - Coronary CTA (8/20): Nonobstructive CAD, CAC 63 Agatston units (77th percentile).  5. Impaired fasting glucose 6. Hyperlipidemia  7. HTN 8. Gout 9. Anemia 10. External hemorrhoids.  11. 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  13. CKD 14. Fatty liver 15. GERD  SH: Nonsmoker. Lives in [place].   FH: No premature CAD  ROS: All systems reviewed and negative except as noted in HPI.    Current Outpatient Medications  Medication Sig Dispense Refill  . ACCU-CHEK SOFTCLIX LANCETS lancets USE AS DIRECTED TO test blood sugar 100 each 6  . acetaminophen (TYLENOL) 500 MG tablet Take 500-1,000 mg by mouth every 6 (six) hours as needed for fever (pain).    Marland Kitchen allopurinol (ZYLOPRIM) 300 MG tablet TAKE one tablet BY MOUTH EVERY DAY (Patient taking differently: Take 300 mg by mouth daily. ) 30 tablet 11  . aspirin 81 MG chewable tablet Chew 81 mg by mouth daily.    . cyanocobalamin 500 MCG tablet Take  1 tablet (500 mcg total) by mouth daily. 30 tablet 5  . cycloSPORINE (RESTASIS) 0.05 % ophthalmic emulsion Place 1 drop into both eyes 2 (two) times daily.    . diazepam (VALIUM) 5 MG tablet Take 5 mg by mouth every 8 (eight) hours as needed for anxiety.     . diclofenac sodium (VOLTAREN) 1 % GEL Apply 2 g topically 4 (four) times daily. 100 g 3  . folic acid (FOLVITE) 1 MG tablet TAKE 1 TABLET BY MOUTH EVERY DAY  (Patient taking differently: Take 1 mg by mouth daily. ) 90 tablet 1  . glucose blood (ACCU-CHEK AVIVA PLUS) test strip USE TO CHECK BLOOD SUGAR ONCE DAILY. Dx code: E11.9 100 strip 12  . isosorbide mononitrate (IMDUR) 30 MG 24 hr tablet Take 0.5 tablets (15 mg total) by mouth daily. 15 tablet 0  . KLOR-CON M20 20 MEQ tablet TAKE 2 TABLETS BY MOUTH TWICE A DAY (Patient taking differently: Take 40 mEq by mouth 2 (two) times daily. ) 360 tablet 0  . loratadine (CLARITIN) 10 MG tablet Take 10 mg by mouth daily as needed for allergies.     . Menthol-Methyl Salicylate (TIGER BALM LINIMENT EX) Apply 1 application topically daily.    . metFORMIN (GLUCOPHAGE) 500 MG tablet Take 1 tablet (500 mg total) by mouth daily with breakfast. (Patient taking differently: Take 500 mg by mouth 2 (two) times daily with a meal. ) 120 tablet 0  . metoprolol succinate (TOPROL-XL) 50 MG 24 hr tablet TAKE 1 TABLET BY MOUTH EVERY DAY (Patient taking differently: Take 50 mg by mouth daily. ) 90 tablet 2  . nitroGLYCERIN (NITROSTAT) 0.4 MG SL tablet Place 0.4 mg under the tongue every 5 (five) minutes as needed for chest pain.    Marland Kitchen. Olopatadine HCl (PAZEO) 0.7 % SOLN Place 1 drop into both eyes daily as needed (itching/ irritation).    Marland Kitchen. omeprazole (PRILOSEC) 20 MG capsule TAKE ONE CAPSULE BY MOUTH TWICE A DAY BEFORE MEALS. INSURANCE ONLY COVERS 1 A DAY (Patient taking differently: Take 20 mg by mouth 2 (two) times daily before a meal. Take with omeprazole 40 mg to equal 60 mg) 60 capsule 1  . omeprazole (PRILOSEC) 40 MG capsule Take 1 capsule PO BID before meals (Patient taking differently: Take 40 mg by mouth 2 (two) times daily. Take with Omeprazole 20 mg to equal 60 mg) 60 capsule 0  . torsemide (DEMADEX) 20 MG tablet 80 mg in the AM 60 mg in the PM 210 tablet 6  . warfarin (COUMADIN) 2 MG tablet TAKE 1/2-1 TABLET BY MOUTH EVERY DAY  OR AS DIRECTED BY COUMADIN CLINIC 30 tablet 0  . rosuvastatin (CRESTOR) 10 MG tablet Take 1  tablet (10 mg total) by mouth daily. 90 tablet 3   No current facility-administered medications for this encounter.     BP 124/82   Pulse 86   Wt 66.9 kg (147 lb 6.4 oz)   SpO2 96%   BMI 29.77 kg/m  General: NAD Neck: JVP 8 cm with HJR, no thyromegaly or thyroid nodule.  Lungs: Clear to auscultation bilaterally with normal respiratory effort. CV: Nondisplaced PMI.  Heart regular S1/S2 with mechanical S1, no S3/S4, 1/6 SEM RUSB.  No peripheral edema.  No carotid bruit.  Normal pedal pulses.  Abdomen: Soft, nontender, no hepatosplenomegaly, no distention.  Skin: Intact without lesions or rashes.  Neurologic: Alert and oriented x 3.  Psych: Normal affect. Extremities: No clubbing or cyanosis.  HEENT: Normal.  Assessment/Plan: 1. Atrial fibrillation: Chronic.  Continue warfarin.  Continue Toprol XL 50 mg daily.     2. Mechanical mitral valve: Continue ASA 81 daily and coumadin with INR goal 2.5-3.5.  Will need bridging with heparin/Lovenox if has to come off coumadin.  Needs endocarditis prophylaxis with any dental work. Mechanical MV looked ok on echo in 8/20.  3. Tricuspid valve repair: Stable by echo in 8/20, mild TR.  4. Chronic diastolic CHF:  Mild volume overload by exam, NYHA class II-III symptoms.  - Increase torsemide to 80 qam/60 qpm with BMET/BNP today and BMET in 10 days.  5. Chest pain: coronary CTA in 8/20 showed nonobstructive CAD, suspect chest pain was noncardiac.  However, would aim to keep LDL < 70.  I will have her stop Zocor and start Crestor 10 mg daily with lipids/LFTs in 2 months.   Followup in 2 months.   Loralie Champagne 04/21/2019

## 2019-04-21 NOTE — Patient Instructions (Signed)
Lab work done today. We will notify you of any abnormal lab work.no news is good news!  Lab work will need to be done again in 10 days.  STOP Zocor  START Crestor 10mg  tab daily.  INCREASE Torsemide to 80mg  every morning and 60mg  every evening.  Please follow up with the [facility] in 2 months  At the [facility], you and your health needs are our priority. As part of our continuing mission to provide you with exceptional heart care, we have created designated Provider Care Teams. These Care Teams include your primary Cardiologist (physician) and Advanced Practice Providers (APPs- Physician Assistants and Nurse Practitioners) who all work together to provide you with the care you need, when you need it.   You may see any of the following providers on your designated Care Team at your next follow up: Marland Kitchen Dr Glori Bickers . Dr Loralie Champagne . Darrick Grinder, NP   Please be sure to bring in all your medications bottles to every appointment.

## 2019-04-27 ENCOUNTER — Other Ambulatory Visit: Payer: Self-pay

## 2019-04-27 ENCOUNTER — Ambulatory Visit (INDEPENDENT_AMBULATORY_CARE_PROVIDER_SITE_OTHER): Payer: Medicare Other | Admitting: Pharmacist

## 2019-04-27 DIAGNOSIS — I059 Rheumatic mitral valve disease, unspecified: Secondary | ICD-10-CM

## 2019-04-27 DIAGNOSIS — Z7901 Long term (current) use of anticoagulants: Secondary | ICD-10-CM | POA: Diagnosis not present

## 2019-04-27 DIAGNOSIS — Z5181 Encounter for therapeutic drug level monitoring: Secondary | ICD-10-CM

## 2019-04-27 LAB — POCT INR: INR: 2.8 (ref 2.0–3.0)

## 2019-04-27 NOTE — Patient Instructions (Signed)
Description   Continue taking 1/2 tablet daily except for a 1 tablet on Sunday, Tuesday and Friday. Recheck in 7 weeks. Call [phone] with any medication changes or up coming procedures.

## 2019-05-01 ENCOUNTER — Other Ambulatory Visit (HOSPITAL_COMMUNITY): Payer: Medicare Other

## 2019-05-06 ENCOUNTER — Other Ambulatory Visit (HOSPITAL_COMMUNITY): Payer: Medicare Other

## 2019-05-11 ENCOUNTER — Other Ambulatory Visit (HOSPITAL_COMMUNITY): Payer: Self-pay | Admitting: Internal Medicine

## 2019-05-11 ENCOUNTER — Ambulatory Visit (HOSPITAL_COMMUNITY)
Admission: RE | Admit: 2019-05-11 | Discharge: 2019-05-11 | Disposition: A | Discharge status: Home / Self Care | Payer: Medicare Other | Source: Ambulatory Visit | Attending: Internal Medicine | Admitting: Internal Medicine

## 2019-05-11 ENCOUNTER — Other Ambulatory Visit: Payer: Self-pay

## 2019-05-11 DIAGNOSIS — I5032 Chronic diastolic (congestive) heart failure: Secondary | ICD-10-CM

## 2019-05-11 LAB — BASIC METABOLIC PANEL
Anion gap: 11 (ref 5–15)
BUN: 26 mg/dL — ABNORMAL HIGH (ref 8–23)
CO2: 26 mmol/L (ref 22–32)
Calcium: 9.3 mg/dL (ref 8.9–10.3)
Chloride: 104 mmol/L (ref 98–111)
Creatinine, Ser: 1.53 mg/dL — ABNORMAL HIGH (ref 0.44–1.00)
GFR calc Af Amer: 41 mL/min — ABNORMAL LOW (ref >60)
GFR calc non Af Amer: 35 mL/min — ABNORMAL LOW (ref >60)
Glucose, Bld: 140 mg/dL — ABNORMAL HIGH (ref 70–99)
Potassium: 3.7 mmol/L (ref 3.5–5.1)
Sodium: 141 mmol/L (ref 135–145)

## 2019-05-13 ENCOUNTER — Other Ambulatory Visit (HOSPITAL_COMMUNITY): Payer: Self-pay | Admitting: Internal Medicine

## 2019-05-14 ENCOUNTER — Other Ambulatory Visit (HOSPITAL_COMMUNITY): Payer: Self-pay | Admitting: Cardiology

## 2019-05-14 ENCOUNTER — Other Ambulatory Visit (HOSPITAL_COMMUNITY): Payer: Self-pay

## 2019-05-14 MED ORDER — OMEPRAZOLE 20 MG PO CPDR: 20.0000 mg | DELAYED_RELEASE_CAPSULE | Freq: Two times a day (BID) | ORAL | 3 refills | Status: DC

## 2019-05-25 ENCOUNTER — Other Ambulatory Visit (HOSPITAL_COMMUNITY): Payer: Medicare Other

## 2019-05-25 ENCOUNTER — Encounter (HOSPITAL_COMMUNITY): Payer: Medicare Other | Admitting: Cardiology

## 2019-05-26 ENCOUNTER — Other Ambulatory Visit (HOSPITAL_COMMUNITY): Payer: Self-pay | Admitting: Cardiology

## 2019-05-26 ENCOUNTER — Other Ambulatory Visit (HOSPITAL_COMMUNITY): Payer: Self-pay

## 2019-05-26 MED ORDER — FOLIC ACID 1 MG PO TABS: 1.0000 mg | ORAL_TABLET | Freq: Every day | ORAL | 1 refills | Status: DC

## 2019-05-29 ENCOUNTER — Other Ambulatory Visit (HOSPITAL_COMMUNITY): Payer: Self-pay | Admitting: Cardiology

## 2019-05-29 ENCOUNTER — Other Ambulatory Visit: Payer: Self-pay

## 2019-06-02 MED ORDER — METFORMIN HCL 500 MG PO TABS: 500.0000 mg | ORAL_TABLET | Freq: Two times a day (BID) | ORAL | 0 refills | Status: DC

## 2019-06-15 ENCOUNTER — Ambulatory Visit (INDEPENDENT_AMBULATORY_CARE_PROVIDER_SITE_OTHER): Payer: Medicare Other | Admitting: Pharmacist

## 2019-06-15 ENCOUNTER — Other Ambulatory Visit: Payer: Self-pay

## 2019-06-15 DIAGNOSIS — I482 Chronic atrial fibrillation, unspecified: Secondary | ICD-10-CM | POA: Diagnosis not present

## 2019-06-15 DIAGNOSIS — I059 Rheumatic mitral valve disease, unspecified: Secondary | ICD-10-CM

## 2019-06-15 DIAGNOSIS — Z5181 Encounter for therapeutic drug level monitoring: Secondary | ICD-10-CM | POA: Diagnosis not present

## 2019-06-15 LAB — POCT INR: INR: 4.7 — AB (ref 2.0–3.0)

## 2019-06-15 NOTE — Patient Instructions (Signed)
Description   No warfarin today or tomorrow. Then continue taking 1/2 tablet daily except for a 1 tablet on Sunday, Tuesday and Friday. Recheck in 3 weeks. Call [phone] with any medication changes or up coming procedures.

## 2019-06-22 ENCOUNTER — Ambulatory Visit (HOSPITAL_COMMUNITY)
Admission: RE | Admit: 2019-06-22 | Discharge: 2019-06-22 | Disposition: A | Discharge status: Home / Self Care | Payer: Medicare Other | Source: Ambulatory Visit | Attending: Cardiology | Admitting: Cardiology

## 2019-06-22 ENCOUNTER — Telehealth (HOSPITAL_COMMUNITY): Payer: Self-pay

## 2019-06-22 ENCOUNTER — Encounter (HOSPITAL_COMMUNITY): Payer: Self-pay | Admitting: Cardiology

## 2019-06-22 ENCOUNTER — Other Ambulatory Visit: Payer: Self-pay

## 2019-06-22 VITALS — BP 128/77 | HR 86 | Wt 150.2 lb

## 2019-06-22 DIAGNOSIS — I130 Hypertensive heart and chronic kidney disease with heart failure and stage 1 through stage 4 chronic kidney disease, or unspecified chronic kidney disease: Secondary | ICD-10-CM | POA: Diagnosis not present

## 2019-06-22 DIAGNOSIS — K760 Fatty (change of) liver, not elsewhere classified: Secondary | ICD-10-CM | POA: Insufficient documentation

## 2019-06-22 DIAGNOSIS — I059 Rheumatic mitral valve disease, unspecified: Secondary | ICD-10-CM

## 2019-06-22 DIAGNOSIS — D560 Alpha thalassemia: Secondary | ICD-10-CM | POA: Insufficient documentation

## 2019-06-22 DIAGNOSIS — E785 Hyperlipidemia, unspecified: Secondary | ICD-10-CM | POA: Insufficient documentation

## 2019-06-22 DIAGNOSIS — Z7901 Long term (current) use of anticoagulants: Secondary | ICD-10-CM | POA: Insufficient documentation

## 2019-06-22 DIAGNOSIS — M109 Gout, unspecified: Secondary | ICD-10-CM | POA: Diagnosis not present

## 2019-06-22 DIAGNOSIS — W19XXXA Unspecified fall, initial encounter: Secondary | ICD-10-CM | POA: Diagnosis not present

## 2019-06-22 DIAGNOSIS — I5032 Chronic diastolic (congestive) heart failure: Secondary | ICD-10-CM

## 2019-06-22 DIAGNOSIS — Y929 Unspecified place or not applicable: Secondary | ICD-10-CM | POA: Insufficient documentation

## 2019-06-22 DIAGNOSIS — Z79899 Other long term (current) drug therapy: Secondary | ICD-10-CM | POA: Diagnosis not present

## 2019-06-22 DIAGNOSIS — S065XAA Traumatic subdural hemorrhage with loss of consciousness status unknown, initial encounter: Secondary | ICD-10-CM

## 2019-06-22 DIAGNOSIS — I2510 Atherosclerotic heart disease of native coronary artery without angina pectoris: Secondary | ICD-10-CM | POA: Diagnosis not present

## 2019-06-22 DIAGNOSIS — I482 Chronic atrial fibrillation, unspecified: Secondary | ICD-10-CM | POA: Diagnosis not present

## 2019-06-22 DIAGNOSIS — S065X9A Traumatic subdural hemorrhage with loss of consciousness of unspecified duration, initial encounter: Secondary | ICD-10-CM | POA: Insufficient documentation

## 2019-06-22 DIAGNOSIS — Z952 Presence of prosthetic heart valve: Secondary | ICD-10-CM | POA: Insufficient documentation

## 2019-06-22 DIAGNOSIS — Z7982 Long term (current) use of aspirin: Secondary | ICD-10-CM | POA: Insufficient documentation

## 2019-06-22 DIAGNOSIS — N189 Chronic kidney disease, unspecified: Secondary | ICD-10-CM | POA: Insufficient documentation

## 2019-06-22 DIAGNOSIS — K219 Gastro-esophageal reflux disease without esophagitis: Secondary | ICD-10-CM | POA: Insufficient documentation

## 2019-06-22 DIAGNOSIS — Z7984 Long term (current) use of oral hypoglycemic drugs: Secondary | ICD-10-CM | POA: Insufficient documentation

## 2019-06-22 DIAGNOSIS — R519 Headache, unspecified: Secondary | ICD-10-CM | POA: Diagnosis not present

## 2019-06-22 DIAGNOSIS — E1169 Type 2 diabetes mellitus with other specified complication: Secondary | ICD-10-CM

## 2019-06-22 LAB — CBC
HCT: 34.7 % — ABNORMAL LOW (ref 36.0–46.0)
Hemoglobin: 10.9 g/dL — ABNORMAL LOW (ref 12.0–15.0)
MCH: 23.7 pg — ABNORMAL LOW (ref 26.0–34.0)
MCHC: 31.4 g/dL (ref 30.0–36.0)
MCV: 75.6 fL — ABNORMAL LOW (ref 80.0–100.0)
Platelets: 152 10*3/uL (ref 150–400)
RBC: 4.59 MIL/uL (ref 3.87–5.11)
RDW: 15.9 % — ABNORMAL HIGH (ref 11.5–15.5)
WBC: 7.1 10*3/uL (ref 4.0–10.5)
nRBC: 0 % (ref 0.0–0.2)

## 2019-06-22 LAB — BASIC METABOLIC PANEL
Anion gap: 11 (ref 5–15)
BUN: 19 mg/dL (ref 8–23)
CO2: 25 mmol/L (ref 22–32)
Calcium: 8.8 mg/dL — ABNORMAL LOW (ref 8.9–10.3)
Chloride: 105 mmol/L (ref 98–111)
Creatinine, Ser: 1.27 mg/dL — ABNORMAL HIGH (ref 0.44–1.00)
GFR calc Af Amer: 51 mL/min — ABNORMAL LOW (ref >60)
GFR calc non Af Amer: 44 mL/min — ABNORMAL LOW (ref >60)
Glucose, Bld: 144 mg/dL — ABNORMAL HIGH (ref 70–99)
Potassium: 3.3 mmol/L — ABNORMAL LOW (ref 3.5–5.1)
Sodium: 141 mmol/L (ref 135–145)

## 2019-06-22 LAB — LIPID PANEL
Cholesterol: 152 mg/dL (ref 0–200)
HDL: 50 mg/dL (ref >40)
LDL Cholesterol: 59 mg/dL (ref 0–99)
Total CHOL/HDL Ratio: 3 RATIO
Triglycerides: 216 mg/dL — ABNORMAL HIGH (ref <150)
VLDL: 43 mg/dL — ABNORMAL HIGH (ref 0–40)

## 2019-06-22 MED ORDER — TORSEMIDE 20 MG PO TABS: ORAL_TABLET | ORAL | Status: DC

## 2019-06-22 NOTE — Telephone Encounter (Signed)
LM for daughter to return call to office

## 2019-06-22 NOTE — Patient Instructions (Addendum)
CT scan of head was ordered today.  Our office will call you when results are available.   Labs today We will only contact you if something comes back abnormal or we need to make some changes. Otherwise no news is good news!  Your physician recommends that you schedule a follow-up appointment in: 6 weeks with Dr Aundra Dubin  At the [facility], you and your health needs are our priority. As part of our continuing mission to provide you with exceptional heart care, we have created designated Provider Care Teams. These Care Teams include your primary Cardiologist (physician) and Advanced Practice Providers (APPs- Physician Assistants and Nurse Practitioners) who all work together to provide you with the care you need, when you need it.   You may see any of the following providers on your designated Care Team at your next follow up: Marland Kitchen Dr Glori Bickers . Dr Loralie Champagne . Darrick Grinder, NP . Lyda Jester, PA   Please be sure to bring in all your medications bottles to every appointment.

## 2019-06-22 NOTE — Telephone Encounter (Signed)
Daughter (was present for appt) was made aware of results and appreciative.

## 2019-06-22 NOTE — Addendum Note (Signed)
Encounter addended by: Larey Dresser, MD on: 06/22/2019 5:46 PM  Actions taken: Clinical Note Signed

## 2019-06-22 NOTE — Telephone Encounter (Signed)
-----   Message from Larey Dresser, MD sent at 06/22/2019  1:14 PM EDT ----- No CNS hemorrhage/subdural hematoma.

## 2019-06-22 NOTE — Telephone Encounter (Signed)
-----   Message from Dalton S McLean, MD sent at 06/22/2019  4:04 PM EDT ----- Increase total daily KCl by 20 mEq. BMET 10 days. 

## 2019-06-22 NOTE — Progress Notes (Signed)
ReDS Vest / Clip - 06/22/19 1000      ReDS Vest / Clip   Station Marker  A    Ruler Value  31    ReDS Value  Low volume

## 2019-06-22 NOTE — Progress Notes (Addendum)
Patient ID: Deborah [name], female   DOB: 1951/06/11, 68 y.o.   MRN: [id number] PCP: Dr. Beryle Flock Cardiology: Dr. [name]  68 y.o. with rheumatic heart disease and MV replacement/TV repair, chronic atrial fibrillation, and chronic diastolic CHF.  Echo in 4/18 showed EF 60-65% with normally functioning mechanical mitral valve.    She is seen periodically also by a cardiologist at an outside facility.   She was admitted in 8/20 with atypical chest pain and mild volume overload (main complaint was chest pain).  Troponin negative.  Coronary CTA was done, showing nonobstructive mild CAD.  Echo showed EF 60-65%, moderate LVH, mildly dilated RV with normal systolic function, s/p TV repair with mild TR, mild AI, mechanical mitral valve with mean gradient 5 mmHg.    She returns for followup of CHF. No further chest pain.  She hit her head hard about a week ago.  She did not lose consciousness but still has headaches.  She is short of breath if she walks fast, otherwise doing ok.  No orthopnea/PND.  Weight up about 3 lbs.    REDS clip 33%    ECG (personally reviewed): atrial fibrillation, rate 81  Labs (9/17): LDL 87, HDL 61 Labs (12/17): HCT 36.7 Labs (1/18): K 3.7, creatinine 1.09, TSH normal, hgb 11.7 Labs (4/18): K 4.1, creatinine 1.37, hgb 11.9 Labs (11/18): K 4.3, creatinine 1.5, tbili 1.2, ALT/AST normal Labs (12/18): LDL 91, HDL 55, hgb 10.9, K 3.7, creatinine 1.22 Labs (3/19): K 3.9, creatinine 1.35 Labs (8/20): K 4.3, creatinine 1.4, LDL 88 Labs (9/20): K 3.7, creatinine 1.5  PMH: 1. Rheumatic heart disease: Status post mechanical MV replacement and tricuspid repair at [facility] in 2002.  Echo 5/12 with EF 60-65%, mild AI, mechanical MV prosthesis with normal function, severe LAE, moderate RAE, moderate to severe TR, mildly elevated PA systolic pressure.  Echo (6/15) EF 55-60%, normal RV, normal mechanical mitral valve, severe LAE, moderate TR.  Echo (9/16) with EF 55-60%, mild to moderate AI,  mechanical mitral valve looks ok, s/p TV repair with mild TR, PASP 40.  - Echo (4/18): EF 60-65%, mechanical mitral valve functioned normally, mild AI, repaired tricuspid valve with mild TR, no TS; PASP 33 mmHg.  - Echo (8/20): EF 60-65%, moderate LVH, mildly dilated RV with normal systolic function, s/p TV repair with mild TR, mild AI, mechanical mitral valve with mean gradient 5 mmHg (normal function).   2. Chronic atrial fibrillation on coumadin.  Event monitor (7/12-8/12) showed no high rate episodes (patient continuously in atrial fibrillation).  3. Chronic diastolic CHF 4. Chest pain: ETT-myoview (7/12): No evidence for ischemia or infarction.  - Coronary CTA (8/20): Nonobstructive CAD, CAC 63 Agatston units (77th percentile).  5. Impaired fasting glucose 6. Hyperlipidemia  7. HTN 8. Gout 9. Anemia 10. External hemorrhoids.  11. 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  13. CKD 14. Fatty liver 15. GERD  SH: Nonsmoker. Lives in [place].   FH: No premature CAD  ROS: All systems reviewed and negative except as noted in HPI.    Current Outpatient Medications  Medication Sig Dispense Refill  . ACCU-CHEK SOFTCLIX LANCETS lancets USE AS DIRECTED TO test blood sugar 100 each 6  . acetaminophen (TYLENOL) 500 MG tablet Take 500-1,000 mg by mouth every 6 (six) hours as needed for fever (pain).    Marland Kitchen allopurinol (ZYLOPRIM) 300 MG tablet TAKE one tablet BY MOUTH EVERY DAY (Patient taking differently: Take 300 mg  by mouth daily. ) 30 tablet 11  . aspirin 81 MG chewable tablet Chew 81 mg by mouth daily.    . cetirizine (ZYRTEC) 10 MG tablet Take 10 mg by mouth daily.    . cyanocobalamin 500 MCG tablet Take 1 tablet (500 mcg total) by mouth daily. 30 tablet 5  . cycloSPORINE (RESTASIS) 0.05 % ophthalmic emulsion Place 1 drop into both eyes 2 (two) times daily.    . diazepam (VALIUM) 5 MG tablet Take 5 mg by mouth every 8  (eight) hours as needed for anxiety.     . diclofenac sodium (VOLTAREN) 1 % GEL Apply 2 g topically 4 (four) times daily. 580 g 3  . folic acid (FOLVITE) 1 MG tablet Take 1 tablet (1 mg total) by mouth daily. 90 tablet 1  . glucose blood (ACCU-CHEK AVIVA PLUS) test strip USE TO CHECK BLOOD SUGAR ONCE DAILY. Dx code: E11.9 100 strip 12  . isosorbide mononitrate (IMDUR) 30 MG 24 hr tablet Take 0.5 tablets (15 mg total) by mouth daily. 15 tablet 0  . KLOR-CON M20 20 MEQ tablet TAKE 2 TABLETS BY MOUTH TWICE A DAY 360 tablet 0  . meclizine (ANTIVERT) 25 MG tablet TAKE 1 TABLET (25 MG TOTAL) BY MOUTH 3 (THREE) TIMES DAILY AS NEEDED FOR DIZZINESS. 30 tablet 0  . Menthol-Methyl Salicylate (TIGER BALM LINIMENT EX) Apply 1 application topically daily.    . metFORMIN (GLUCOPHAGE) 500 MG tablet Take 1 tablet (500 mg total) by mouth 2 (two) times daily with a meal. 180 tablet 0  . metoprolol succinate (TOPROL-XL) 50 MG 24 hr tablet TAKE 1 TABLET BY MOUTH EVERY DAY (Patient taking differently: Take 50 mg by mouth daily. ) 90 tablet 2  . nitroGLYCERIN (NITROSTAT) 0.4 MG SL tablet Place 0.4 mg under the tongue every 5 (five) minutes as needed for chest pain.    Marland Kitchen Olopatadine HCl (PAZEO) 0.7 % SOLN Place 1 drop into both eyes daily as needed (itching/ irritation).    Marland Kitchen omeprazole (PRILOSEC) 20 MG capsule Take 1 capsule (20 mg total) by mouth 2 (two) times daily before a meal. Take with omeprazole 40 mg to equal 60 mg 60 capsule 3  . omeprazole (PRILOSEC) 40 MG capsule Take 1 capsule PO BID before meals (Patient taking differently: Take 40 mg by mouth daily. Take with Omeprazole 20 mg to equal 60 mg) 60 capsule 0  . rosuvastatin (CRESTOR) 10 MG tablet Take 1 tablet (10 mg total) by mouth daily. 90 tablet 3  . torsemide (DEMADEX) 20 MG tablet Take 4 tablets (80 mg total) by mouth every morning AND 3 tablets (60 mg total) every evening. Takes 4 tabs in the morning, and 3 in the evening . 210 tablet   . warfarin  (COUMADIN) 2 MG tablet TAKE 1/2-1 TABLET BY MOUTH EVERY DAY OR AS DIRECTED BY COUMADIN CLINIC 30 tablet 3   No current facility-administered medications for this encounter.     BP 128/77   Pulse 86   Wt 68.1 kg (150 lb 3.2 oz)   SpO2 98%   BMI 30.34 kg/m  General: NAD Neck: JVP 8 cm, no thyromegaly or thyroid nodule.  Lungs: Clear to auscultation bilaterally with normal respiratory effort. CV: Nondisplaced PMI.  Heart regular S1/S2 with mechanical S1, no S3/S4, 1/6 SEM RUSB.  No peripheral edema.  No carotid bruit.  Normal pedal pulses.  Abdomen: Soft, nontender, no hepatosplenomegaly, no distention.  Skin: Intact without lesions or rashes.  Neurologic: Alert and oriented  x 3.  Psych: Normal affect. Extremities: No clubbing or cyanosis.  HEENT: Normal.   Assessment/Plan: 1. Atrial fibrillation: Chronic.  Continue warfarin.  Continue Toprol XL 50 mg daily.     2. Mechanical mitral valve: Continue ASA 81 daily and coumadin with INR goal 2.5-3.5.  Will need bridging with heparin/Lovenox if has to come off coumadin.  Needs endocarditis prophylaxis with any dental work. Mechanical MV looked ok on echo in 8/20.  - CBC today.  3. Tricuspid valve repair: Stable by echo in 8/20, mild TR.  4. Chronic diastolic CHF:  I think that volume status is ok.  Chronic NYHA class II-III symptoms, no change.  REDS clip 33%.   - Continue torsemide 80 qam/60 qpm, BMET today.  5. CAD: coronary CTA in 8/20 showed nonobstructive CAD, suspect chest pain was noncardiac.  However, would aim to keep LDL < 70.   - Continue Crestor, check lipids today. 6. Fall/headache: Given anticoagulation, CT head w/o contrast done today.  No CNS/subdural hemorrhage.  Followup in 6 wks.   Marca AnconaDalton Triston Lisanti 06/22/2019

## 2019-06-23 NOTE — Telephone Encounter (Signed)
Daughter left VM requesting return call. I called her no answer/left vm for her to return call.

## 2019-06-24 ENCOUNTER — Telehealth (HOSPITAL_COMMUNITY): Payer: Self-pay

## 2019-06-24 DIAGNOSIS — I5032 Chronic diastolic (congestive) heart failure: Secondary | ICD-10-CM

## 2019-06-24 MED ORDER — POTASSIUM CHLORIDE CRYS ER 20 MEQ PO TBCR: EXTENDED_RELEASE_TABLET | ORAL | 2 refills | Status: DC

## 2019-06-24 MED ORDER — TORSEMIDE 20 MG PO TABS: ORAL_TABLET | ORAL | 3 refills | Status: DC

## 2019-06-24 NOTE — Telephone Encounter (Signed)
Patient aware of results and verbalized understanding of instructions and was able to repeat back clearly.  appt made to repeat labs. Pt notes that she was not able to get script for torsemide from pharmacy.  When previously sent, It was set at no print.  Resent to pharmacy

## 2019-06-24 NOTE — Telephone Encounter (Signed)
-----   Message from Larey Dresser, MD sent at 06/22/2019  4:04 PM EDT ----- Increase total daily KCl by 20 mEq. BMET 10 days.

## 2019-07-03 ENCOUNTER — Ambulatory Visit (HOSPITAL_COMMUNITY)
Admission: RE | Admit: 2019-07-03 | Discharge: 2019-07-03 | Disposition: A | Discharge status: Home / Self Care | Payer: Medicare Other | Source: Ambulatory Visit | Attending: Internal Medicine | Admitting: Internal Medicine

## 2019-07-03 ENCOUNTER — Other Ambulatory Visit: Payer: Self-pay

## 2019-07-03 DIAGNOSIS — I5032 Chronic diastolic (congestive) heart failure: Secondary | ICD-10-CM | POA: Diagnosis present

## 2019-07-03 LAB — BASIC METABOLIC PANEL
Anion gap: 10 (ref 5–15)
BUN: 23 mg/dL (ref 8–23)
CO2: 25 mmol/L (ref 22–32)
Calcium: 9.6 mg/dL (ref 8.9–10.3)
Chloride: 105 mmol/L (ref 98–111)
Creatinine, Ser: 1.44 mg/dL — ABNORMAL HIGH (ref 0.44–1.00)
GFR calc Af Amer: 44 mL/min — ABNORMAL LOW (ref >60)
GFR calc non Af Amer: 38 mL/min — ABNORMAL LOW (ref >60)
Glucose, Bld: 200 mg/dL — ABNORMAL HIGH (ref 70–99)
Potassium: 4 mmol/L (ref 3.5–5.1)
Sodium: 140 mmol/L (ref 135–145)

## 2019-07-06 ENCOUNTER — Other Ambulatory Visit: Payer: Self-pay

## 2019-07-06 ENCOUNTER — Ambulatory Visit (INDEPENDENT_AMBULATORY_CARE_PROVIDER_SITE_OTHER): Payer: Medicare Other | Admitting: Pharmacist

## 2019-07-06 DIAGNOSIS — Z7901 Long term (current) use of anticoagulants: Secondary | ICD-10-CM | POA: Diagnosis not present

## 2019-07-06 DIAGNOSIS — I059 Rheumatic mitral valve disease, unspecified: Secondary | ICD-10-CM | POA: Diagnosis not present

## 2019-07-06 DIAGNOSIS — Z5181 Encounter for therapeutic drug level monitoring: Secondary | ICD-10-CM

## 2019-07-06 LAB — POCT INR: INR: 3.9 — AB (ref 2.0–3.0)

## 2019-07-06 NOTE — Patient Instructions (Signed)
Description   No warfarin today. Then start taking 1/2 tablet daily except for a 1 tablet on Tuesday and Friday. Recheck in 3 weeks. Call [phone] with any medication changes or up coming procedures.

## 2019-07-27 ENCOUNTER — Ambulatory Visit (INDEPENDENT_AMBULATORY_CARE_PROVIDER_SITE_OTHER): Payer: Medicare Other | Admitting: *Deleted

## 2019-07-27 ENCOUNTER — Other Ambulatory Visit: Payer: Self-pay

## 2019-07-27 DIAGNOSIS — I059 Rheumatic mitral valve disease, unspecified: Secondary | ICD-10-CM | POA: Diagnosis not present

## 2019-07-27 DIAGNOSIS — Z5181 Encounter for therapeutic drug level monitoring: Secondary | ICD-10-CM

## 2019-07-27 DIAGNOSIS — Z7901 Long term (current) use of anticoagulants: Secondary | ICD-10-CM | POA: Diagnosis not present

## 2019-07-27 LAB — POCT INR: INR: 3.8 — AB (ref 2.0–3.0)

## 2019-07-27 NOTE — Patient Instructions (Signed)
Description   Do not take any Warfarin today then start taking 1/2 tablet daily except for a 1 tablet on Fridays. Recheck in 3 weeks. Call [phone] with any medication changes or up coming procedures.

## 2019-08-03 ENCOUNTER — Ambulatory Visit (HOSPITAL_COMMUNITY)
Admission: RE | Admit: 2019-08-03 | Discharge: 2019-08-03 | Disposition: A | Discharge status: Home / Self Care | Payer: Medicare Other | Source: Ambulatory Visit | Attending: Cardiology | Admitting: Cardiology

## 2019-08-03 ENCOUNTER — Encounter (HOSPITAL_COMMUNITY): Payer: Self-pay

## 2019-08-03 ENCOUNTER — Other Ambulatory Visit: Payer: Self-pay

## 2019-08-03 VITALS — BP 109/73 | HR 82 | Wt 149.4 lb

## 2019-08-03 DIAGNOSIS — I130 Hypertensive heart and chronic kidney disease with heart failure and stage 1 through stage 4 chronic kidney disease, or unspecified chronic kidney disease: Secondary | ICD-10-CM | POA: Diagnosis not present

## 2019-08-03 DIAGNOSIS — Z952 Presence of prosthetic heart valve: Secondary | ICD-10-CM | POA: Insufficient documentation

## 2019-08-03 DIAGNOSIS — N189 Chronic kidney disease, unspecified: Secondary | ICD-10-CM | POA: Insufficient documentation

## 2019-08-03 DIAGNOSIS — Z79899 Other long term (current) drug therapy: Secondary | ICD-10-CM | POA: Insufficient documentation

## 2019-08-03 DIAGNOSIS — D560 Alpha thalassemia: Secondary | ICD-10-CM | POA: Insufficient documentation

## 2019-08-03 DIAGNOSIS — Z7901 Long term (current) use of anticoagulants: Secondary | ICD-10-CM | POA: Diagnosis not present

## 2019-08-03 DIAGNOSIS — K219 Gastro-esophageal reflux disease without esophagitis: Secondary | ICD-10-CM | POA: Diagnosis not present

## 2019-08-03 DIAGNOSIS — D649 Anemia, unspecified: Secondary | ICD-10-CM | POA: Diagnosis not present

## 2019-08-03 DIAGNOSIS — I482 Chronic atrial fibrillation, unspecified: Secondary | ICD-10-CM | POA: Diagnosis not present

## 2019-08-03 DIAGNOSIS — K760 Fatty (change of) liver, not elsewhere classified: Secondary | ICD-10-CM | POA: Insufficient documentation

## 2019-08-03 DIAGNOSIS — I5032 Chronic diastolic (congestive) heart failure: Secondary | ICD-10-CM | POA: Diagnosis present

## 2019-08-03 DIAGNOSIS — I2510 Atherosclerotic heart disease of native coronary artery without angina pectoris: Secondary | ICD-10-CM | POA: Diagnosis not present

## 2019-08-03 DIAGNOSIS — Z7982 Long term (current) use of aspirin: Secondary | ICD-10-CM | POA: Diagnosis not present

## 2019-08-03 DIAGNOSIS — I099 Rheumatic heart disease, unspecified: Secondary | ICD-10-CM | POA: Insufficient documentation

## 2019-08-03 DIAGNOSIS — M109 Gout, unspecified: Secondary | ICD-10-CM | POA: Insufficient documentation

## 2019-08-03 DIAGNOSIS — E785 Hyperlipidemia, unspecified: Secondary | ICD-10-CM | POA: Diagnosis not present

## 2019-08-03 DIAGNOSIS — Z7984 Long term (current) use of oral hypoglycemic drugs: Secondary | ICD-10-CM | POA: Insufficient documentation

## 2019-08-03 LAB — BASIC METABOLIC PANEL
Anion gap: 10 (ref 5–15)
BUN: 23 mg/dL (ref 8–23)
CO2: 23 mmol/L (ref 22–32)
Calcium: 9.2 mg/dL (ref 8.9–10.3)
Chloride: 106 mmol/L (ref 98–111)
Creatinine, Ser: 1.39 mg/dL — ABNORMAL HIGH (ref 0.44–1.00)
GFR calc Af Amer: 46 mL/min — ABNORMAL LOW (ref >60)
GFR calc non Af Amer: 39 mL/min — ABNORMAL LOW (ref >60)
Glucose, Bld: 119 mg/dL — ABNORMAL HIGH (ref 70–99)
Potassium: 4.1 mmol/L (ref 3.5–5.1)
Sodium: 139 mmol/L (ref 135–145)

## 2019-08-03 LAB — BRAIN NATRIURETIC PEPTIDE: B Natriuretic Peptide: 127.4 pg/mL — ABNORMAL HIGH (ref 0.0–100.0)

## 2019-08-03 LAB — CBC
HCT: 38 % (ref 36.0–46.0)
Hemoglobin: 11.6 g/dL — ABNORMAL LOW (ref 12.0–15.0)
MCH: 23.3 pg — ABNORMAL LOW (ref 26.0–34.0)
MCHC: 30.5 g/dL (ref 30.0–36.0)
MCV: 76.3 fL — ABNORMAL LOW (ref 80.0–100.0)
Platelets: 169 10*3/uL (ref 150–400)
RBC: 4.98 MIL/uL (ref 3.87–5.11)
RDW: 15.9 % — ABNORMAL HIGH (ref 11.5–15.5)
WBC: 7.2 10*3/uL (ref 4.0–10.5)
nRBC: 0 % (ref 0.0–0.2)

## 2019-08-03 LAB — HEMOGLOBIN A1C
Hgb A1c MFr Bld: 7.3 % — ABNORMAL HIGH (ref 4.8–5.6)
Mean Plasma Glucose: 162.81 mg/dL

## 2019-08-03 NOTE — Addendum Note (Signed)
Encounter addended by: Chari Manning, CMA on: 08/03/2019 11:52 AM  Actions taken: Clinical Note Signed

## 2019-08-03 NOTE — Addendum Note (Signed)
Encounter addended by: Kerry Dory, CMA on: 08/03/2019 12:36 PM  Actions taken: Charge Capture section accepted

## 2019-08-03 NOTE — Progress Notes (Signed)
Advanced HF Clinic Note    Patient ID: Deborah [name], female   DOB: 04/01/1951, 68 y.o.   MRN: [id number] PCP: Dr. Beryle FlockBacigalupo Cardiology: Dr. Shirlee LatchMcLean  68 y.o. female with rheumatic heart disease and MV replacement/TV repair, chronic atrial fibrillation, and chronic diastolic CHF.  Echo in 4/18 showed EF 60-65% with normally functioning mechanical mitral valve.    She is seen periodically also by a cardiologist at an outside facility.   She was admitted in 8/20 with atypical chest pain and mild volume overload (main complaint was chest pain).  Troponin negative.  Coronary CTA was done, showing nonobstructive mild CAD.  Echo showed EF 60-65%, moderate LVH, mildly dilated RV with normal systolic function, s/p TV repair with mild TR, mild AI, mechanical mitral valve with mean gradient 5 mmHg.    She had mechanical fall in 05/2019 while on coumadin but head CT showed no bleed.   She presents to clinic today for routine f/u. ReDS clip measurement elevated at 42% (33% at previous visit in October). Her weight however is unchanged, down 1 lb from last visit, 150>>149 lb. Wt has also been stable at home w/o increase per pt report. She denies any change in symptoms since her last OV. She walks for exercise. Walks 3 days a week and can walk ~15-20 min at a time before having to stop to rest for dyspnea. This has not change. Also denies orthopnea/ PND. No LEE. No ischemic CP. Reports full med compliance. BP today 109/73/ Pulse rate 82 bpm. No abnormal bleeding w/ coumadin. Denies any recurrent falls. Also has fatty liver and followed by GI. Plans to have an EGD/ colonoscopy in the next few months. She is aware that she will need Lovenox bridge while coumadin is being held and will arrange through coumadin clinic. Her daughter also verbalized understanding.   ECG Not performed: chronic afib at baseline. Pulse rate controlled, low 80s  Labs (9/17): LDL 87, HDL 61 Labs (12/17): HCT 36.7 Labs (1/18): K 3.7,  creatinine 1.09, TSH normal, hgb 11.7 Labs (4/18): K 4.1, creatinine 1.37, hgb 11.9 Labs (11/18): K 4.3, creatinine 1.5, tbili 1.2, ALT/AST normal Labs (12/18): LDL 91, HDL 55, hgb 10.9, K 3.7, creatinine 1.22 Labs (3/19): K 3.9, creatinine 1.35 Labs (8/20): K 4.3, creatinine 1.4, LDL 88 Labs (9/20): K 3.7, creatinine 1.5  PMH: 1. Rheumatic heart disease: Status post mechanical MV replacement and tricuspid repair at [facility] in 2002.  Echo 5/12 with EF 60-65%, mild AI, mechanical MV prosthesis with normal function, severe LAE, moderate RAE, moderate to severe TR, mildly elevated PA systolic pressure.  Echo (6/15) EF 55-60%, normal RV, normal mechanical mitral valve, severe LAE, moderate TR.  Echo (9/16) with EF 55-60%, mild to moderate AI, mechanical mitral valve looks ok, s/p TV repair with mild TR, PASP 40.  - Echo (4/18): EF 60-65%, mechanical mitral valve functioned normally, mild AI, repaired tricuspid valve with mild TR, no TS; PASP 33 mmHg.  - Echo (8/20): EF 60-65%, moderate LVH, mildly dilated RV with normal systolic function, s/p TV repair with mild TR, mild AI, mechanical mitral valve with mean gradient 5 mmHg (normal function).   2. Chronic atrial fibrillation on coumadin.  Event monitor (7/12-8/12) showed no high rate episodes (patient continuously in atrial fibrillation).  3. Chronic diastolic CHF 4. Chest pain: ETT-myoview (7/12): No evidence for ischemia or infarction.  - Coronary CTA (8/20): Nonobstructive CAD, CAC 63 Agatston units (77th percentile).  5. Impaired fasting glucose 6. Hyperlipidemia  7. HTN 8. Gout 9. Anemia 10. External hemorrhoids.  11. 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  13. CKD 14. Fatty liver 15. GERD  SH: Nonsmoker. Lives in [place].   FH: No premature CAD  ROS: All systems reviewed and negative except as noted in HPI.    Current Outpatient Medications  Medication Sig  Dispense Refill  . ACCU-CHEK SOFTCLIX LANCETS lancets USE AS DIRECTED TO test blood sugar 100 each 6  . acetaminophen (TYLENOL) 500 MG tablet Take 500-1,000 mg by mouth every 6 (six) hours as needed for fever (pain).    Marland Kitchen allopurinol (ZYLOPRIM) 300 MG tablet TAKE one tablet BY MOUTH EVERY DAY 30 tablet 11  . aspirin 81 MG chewable tablet Chew 81 mg by mouth daily.    . cetirizine (ZYRTEC) 10 MG tablet Take 10 mg by mouth daily.    . cyanocobalamin 500 MCG tablet Take 1 tablet (500 mcg total) by mouth daily. 30 tablet 5  . cycloSPORINE (RESTASIS) 0.05 % ophthalmic emulsion Place 1 drop into both eyes 2 (two) times daily.    . diazepam (VALIUM) 5 MG tablet Take 5 mg by mouth every 8 (eight) hours as needed for anxiety.     . diclofenac sodium (VOLTAREN) 1 % GEL Apply 2 g topically 4 (four) times daily. 100 g 3  . famotidine (PEPCID) 20 MG tablet Take 20 mg by mouth daily.    . folic acid (FOLVITE) 1 MG tablet Take 1 tablet (1 mg total) by mouth daily. 90 tablet 1  . glucose blood (ACCU-CHEK AVIVA PLUS) test strip USE TO CHECK BLOOD SUGAR ONCE DAILY. Dx code: E11.9 100 strip 12  . isosorbide mononitrate (IMDUR) 30 MG 24 hr tablet Take 0.5 tablets (15 mg total) by mouth daily. 15 tablet 0  . meclizine (ANTIVERT) 25 MG tablet TAKE 1 TABLET (25 MG TOTAL) BY MOUTH 3 (THREE) TIMES DAILY AS NEEDED FOR DIZZINESS. 30 tablet 0  . Menthol-Methyl Salicylate (TIGER BALM LINIMENT EX) Apply 1 application topically daily.    . metFORMIN (GLUCOPHAGE) 500 MG tablet Take 1 tablet (500 mg total) by mouth 2 (two) times daily with a meal. 180 tablet 0  . metoprolol succinate (TOPROL-XL) 50 MG 24 hr tablet TAKE 1 TABLET BY MOUTH EVERY DAY 90 tablet 2  . nitroGLYCERIN (NITROSTAT) 0.4 MG SL tablet Place 0.4 mg under the tongue every 5 (five) minutes as needed for chest pain.    Marland Kitchen Olopatadine HCl (PAZEO) 0.7 % SOLN Place 1 drop into both eyes daily as needed (itching/ irritation).    Marland Kitchen omeprazole (PRILOSEC) 20 MG capsule  Take 1 capsule (20 mg total) by mouth 2 (two) times daily before a meal. Take with omeprazole 40 mg to equal 60 mg 60 capsule 3  . omeprazole (PRILOSEC) 40 MG capsule Take 1 capsule PO BID before meals 60 capsule 0  . potassium chloride SA (KLOR-CON M20) 20 MEQ tablet Take 3 tablets (60 mEq total) by mouth every morning AND 2 tablets (40 mEq total) every evening. 450 tablet 2  . rosuvastatin (CRESTOR) 10 MG tablet Take 1 tablet (10 mg total) by mouth daily. 90 tablet 3  . torsemide (DEMADEX) 20 MG tablet Take 4 tablets (80 mg total) by mouth every morning AND 3 tablets (60 mg total) every evening. Takes 4 tabs in the morning, and 3 in the evening . 210 tablet 3  . warfarin (COUMADIN) 2 MG tablet TAKE 1/2-1 TABLET BY  MOUTH EVERY DAY OR AS DIRECTED BY COUMADIN CLINIC 30 tablet 3  . zinc sulfate 220 (50 Zn) MG capsule Take 220 mg by mouth daily.     No current facility-administered medications for this encounter.     BP 109/73   Pulse 82   Wt 67.8 kg (149 lb 6.4 oz)   SpO2 95%   BMI 30.18 kg/m  PHYSICAL EXAM:  General:  Well appearing asian female. No respiratory difficulty HEENT: normal Neck: supple. no JVD. Carotids 2+ bilat; no bruits. No lymphadenopathy or thyromegaly appreciated. Cor: PMI nondisplaced. Irregularly irregular rhythm, regular rate. No rubs, gallops or murmurs. Lungs: clear Abdomen: soft, nontender, nondistended. No hepatosplenomegaly. No bruits or masses. Good bowel sounds. Extremities: no cyanosis, clubbing, rash, edema Neuro: alert & oriented x 3, cranial nerves grossly intact. moves all 4 extremities w/o difficulty. Affect pleasant.    Assessment/Plan: 1. Atrial fibrillation: Chronic. Rate controlled in the 80s. Asymptomatic. Continue Toprol XL 50 mg daily for rate control. Continue warfarin for stroke prophylaxis.   2. Mechanical mitral valve: Continue ASA 81 daily and coumadin with INR goal 2.5-3.5.  Will need bridging with heparin/Lovenox if has to come off  coumadin.  Needs endocarditis prophylaxis with any dental work. Mechanical MV looked ok on echo in 8/20.  - CBC today.  Denies abnormal bleeding. No recurrent mechanical falls.  - INRs followed by [facility] [address] [facility] 3. Tricuspid valve repair: Stable by echo in 8/20, mild TR.  4. Chronic diastolic CHF:  I think that volume status is ok, despite ReDS clip reading at 42%. Wt has been stable by clinic scale and also stable by her home scale. Appears euvolemic on exam and no increased dyspnea.  Chronic NYHA class II symptoms. Given high ReDs clip reading, will check BNP to better assess volume status. If elevated, will need to adjust diuretics. If WNL, will continue current regimen, torsemide 80 qam/60 qpm. Will also check BMP today. Will advise regarding treatment plan when results return.   5. CAD: coronary CTA in 8/20 showed nonobstructive CAD, suspect chest pain was noncardiac.  However, would aim to keep LDL < 70. Recent FLP 06/22/19 showed controlled LDL at 59 mg/dL.    - Continue Crestor - no recent ischemic like CP 6. Fatty Liver: followed by GI. Has f/u scheduled today. On statin but recent HFTs WNL. Per daughter, will need EGD and colonoscopy in the upcomming months. We discussed peri procedural anticoagulation. She is aware that she will need Lovenox bridge while coumadin is being held and will arrange through coumadin clinic. Her daughter also verbalized understanding.   F/u w/ Dr. [name] in 2-3 months.     Robbie Lis, PA-C 08/03/2019

## 2019-08-03 NOTE — Progress Notes (Signed)
ReDS Vest / Clip - 08/03/19 1100      ReDS Vest / Clip   Station Marker  A    Ruler Value  31    ReDS Value Range  (!) High volume overload    ReDS Actual Value  42    Anatomical Comments  sitting

## 2019-08-03 NOTE — Patient Instructions (Signed)
Labs were done today. We will only call you if there are any Abnormalities. No Call Is A Good Call!!   Your Provider would like for you to follow up with him in 2-3 months.   At the [facility], you and your health needs are our priority. As part of our continuing mission to provide you with exceptional heart care, we have created designated Provider Care Teams. These Care Teams include your primary Cardiologist (physician) and Advanced Practice Providers (APPs- Physician Assistants and Nurse Practitioners) who all work together to provide you with the care you need, when you need it.   You may see any of the following providers on your designated Care Team at your next follow up: Marland Kitchen Dr Glori Bickers . Dr Loralie Champagne . Darrick Grinder, NP . Lyda Jester, PA . Audry Riles, PharmD   Please be sure to bring in all your medications bottles to every appointment.

## 2019-08-07 ENCOUNTER — Other Ambulatory Visit: Payer: Self-pay

## 2019-08-07 DIAGNOSIS — Z20822 Contact with and (suspected) exposure to covid-19: Secondary | ICD-10-CM

## 2019-08-09 LAB — NOVEL CORONAVIRUS, NAA: SARS-CoV-2, NAA: NOT DETECTED

## 2019-08-10 ENCOUNTER — Telehealth (INDEPENDENT_AMBULATORY_CARE_PROVIDER_SITE_OTHER): Payer: Medicare Other | Admitting: Family Medicine

## 2019-08-10 ENCOUNTER — Other Ambulatory Visit: Payer: Self-pay

## 2019-08-10 DIAGNOSIS — Z20828 Contact with and (suspected) exposure to other viral communicable diseases: Secondary | ICD-10-CM

## 2019-08-10 DIAGNOSIS — Z20822 Contact with and (suspected) exposure to covid-19: Secondary | ICD-10-CM | POA: Insufficient documentation

## 2019-08-10 MED ORDER — BENZONATATE 200 MG PO CAPS: 200.0000 mg | ORAL_CAPSULE | Freq: Two times a day (BID) | ORAL | 2 refills | Status: DC | PRN

## 2019-08-10 NOTE — Assessment & Plan Note (Signed)
The patient was potentially exposed to Covid on July 31, 2009 days ago.  Patient had a negative Covid test on 08/07/2019.  She has not had any symptoms.  I feel that at this time it is safe for the patient to resume her normal activity as long as she continues to socially distance and wear a mask.  She should continue to avoid large social gatherings (10+ people) and not go anywhere should she ever develop symptoms such as fevers, cough, shortness of breath, or other symptoms concerning for Covid.

## 2019-08-10 NOTE — Progress Notes (Signed)
[facility] Telemedicine Visit  Patient consented to have virtual visit. Method of visit: Video was attempted, but technology challenges prevented patient from using video, so visit was conducted via telephone.  Encounter participants: Patient: Deborah [name] - located at Home [phone] Provider: Daisy Floro - located at [facility] Others (if applicable): Daughter Fayne Mediate; Interpreter [place] [phone] via [employer] [phone]  Chief Complaint: Exposure to COVID  HPI: This is a very pleasant patient calling in with the help of her daughter because she was exposed to a COVID positive individual on Saturday Dec 5th.  She had attended a funeral that day and later found out that a few people from that church had tested positive afterward. She was tested for COVID 3 days ago on 08/07/2019 and her results returned negative.  The patient has been asymptomatic, denies fevers, shortness of breath, chills, rashes, nausea, vomiting, p.o. intolerance, loss of taste/smell it was negative, and body aches.  She has not been taking any medications such as tylenol.   ROS: per HPI  Pertinent PMHx:  Patient Active Problem List   Diagnosis Date Noted  . Close exposure to COVID-19 virus 08/10/2019  . Unstable angina ([place]) 04/08/2019  . Chest pain 04/08/2019  . Cough 07/11/2018  . Chronic pain of both ankles 09/30/2017  . Pain in joint involving ankle and foot 09/16/2017  . NAFLD (nonalcoholic fatty liver disease) 06/03/2017  . Knee pain, bilateral 04/12/2017  . Low back pain without sciatica 06/12/2016  . Nonspecific chest pain   . Vision changes 02/21/2016  . RUQ pain 03/24/2015  . Hemoglobin E disease ([place]) 02/10/2015  . Encounter for therapeutic drug monitoring 09/30/2013  . 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 H/O prosthetic heart valve 07/08/2013  . Eosinophilia 01/27/2013  . Microcytic anemia 05/01/2012  . 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 Postprocedural state 05/22/2011  . Diastolic CHF, chronic ([place]) 03/15/2011  . Rheumatic heart disease   . Chronic atrial fibrillation ([place])   . Chronic anticoagulation   . DM (diabetes mellitus) ([place])   . Hyperlipidemia   . Mitral valve disorder 11/16/2010  . GOUT 11/29/2009  . TRICUSPID REGURGITATION, MODERATE WITH MILD PULMONARY HTN 11/29/2009  . HYPERBILIRUBINEMIA 06/22/2009  . HYPERGLYCEMIA 06/21/2009  . Hyperlipidemia associated with type 2 diabetes mellitus ([place]) 01/07/2009  . GERD 01/07/2009  . CARPAL TUNNEL SYNDROME, LEFT 11/11/2008     Exam:  Respiratory: Speaking in complete sentences, no shortness of breath  Assessment/Plan: Close exposure to COVID-19 virus The patient was potentially exposed to Covid on July 31, 2009 days ago.  Patient had a negative Covid test on 08/07/2019.  She has not had any symptoms.  I feel that at this time it is safe for the patient to resume her normal activity as long as she continues to socially distance and wear a mask.  She should continue to avoid large social gatherings (10+ people) and not go anywhere should she ever develop symptoms such as fevers, cough, shortness of breath, or other symptoms concerning for Covid.    Time spent during visit with patient: 10 minutes  Milus Banister, [place], PGY-2 08/10/2019 11:09 AM

## 2019-08-19 ENCOUNTER — Ambulatory Visit (INDEPENDENT_AMBULATORY_CARE_PROVIDER_SITE_OTHER): Payer: Medicare Other | Admitting: *Deleted

## 2019-08-19 ENCOUNTER — Other Ambulatory Visit: Payer: Self-pay

## 2019-08-19 DIAGNOSIS — Z7901 Long term (current) use of anticoagulants: Secondary | ICD-10-CM

## 2019-08-19 DIAGNOSIS — I059 Rheumatic mitral valve disease, unspecified: Secondary | ICD-10-CM | POA: Diagnosis not present

## 2019-08-19 DIAGNOSIS — Z5181 Encounter for therapeutic drug level monitoring: Secondary | ICD-10-CM

## 2019-08-19 LAB — POCT INR: INR: 2.1 (ref 2.0–3.0)

## 2019-08-19 NOTE — Patient Instructions (Signed)
Description   Today take 1 tablet then continue taking 1/2 tablet daily except for a 1 tablet on Fridays. Recheck in 3 weeks. Call [phone] with any medication changes or up coming procedures.

## 2019-08-24 ENCOUNTER — Other Ambulatory Visit: Payer: Self-pay | Admitting: Student in an Organized Health Care Education/Training Program

## 2019-08-31 ENCOUNTER — Other Ambulatory Visit (HOSPITAL_COMMUNITY): Payer: Self-pay | Admitting: Cardiology

## 2019-09-07 ENCOUNTER — Other Ambulatory Visit: Payer: Self-pay

## 2019-09-07 ENCOUNTER — Ambulatory Visit (INDEPENDENT_AMBULATORY_CARE_PROVIDER_SITE_OTHER): Payer: Medicare Other | Admitting: Pharmacist

## 2019-09-07 DIAGNOSIS — I059 Rheumatic mitral valve disease, unspecified: Secondary | ICD-10-CM | POA: Diagnosis not present

## 2019-09-07 DIAGNOSIS — Z5181 Encounter for therapeutic drug level monitoring: Secondary | ICD-10-CM

## 2019-09-07 DIAGNOSIS — Z7901 Long term (current) use of anticoagulants: Secondary | ICD-10-CM | POA: Diagnosis not present

## 2019-09-07 LAB — POCT INR: INR: 2.8 (ref 2.0–3.0)

## 2019-09-07 NOTE — Patient Instructions (Signed)
Description   Continue taking 1/2 tablet daily except for a 1 tablet on Fridays. Recheck in 4 weeks. Call [phone] with any medication changes or up coming procedures.

## 2019-09-21 ENCOUNTER — Other Ambulatory Visit (HOSPITAL_COMMUNITY): Payer: Self-pay | Admitting: Cardiology

## 2019-10-05 ENCOUNTER — Ambulatory Visit (INDEPENDENT_AMBULATORY_CARE_PROVIDER_SITE_OTHER): Payer: Medicare Other | Admitting: *Deleted

## 2019-10-05 ENCOUNTER — Other Ambulatory Visit: Payer: Self-pay

## 2019-10-05 DIAGNOSIS — I059 Rheumatic mitral valve disease, unspecified: Secondary | ICD-10-CM | POA: Diagnosis not present

## 2019-10-05 DIAGNOSIS — Z7901 Long term (current) use of anticoagulants: Secondary | ICD-10-CM

## 2019-10-05 DIAGNOSIS — Z5181 Encounter for therapeutic drug level monitoring: Secondary | ICD-10-CM | POA: Diagnosis not present

## 2019-10-05 LAB — POCT INR: INR: 4 — AB (ref 2.0–3.0)

## 2019-10-05 NOTE — Patient Instructions (Addendum)
Description   Hold today, then continue taking 1/2 tablet daily except for  1 tablet on Fridays. Recheck in 3 weeks. Call [phone] with any medication changes or up coming procedures.

## 2019-10-26 ENCOUNTER — Other Ambulatory Visit: Payer: Self-pay

## 2019-10-26 ENCOUNTER — Ambulatory Visit (INDEPENDENT_AMBULATORY_CARE_PROVIDER_SITE_OTHER): Payer: Medicare Other

## 2019-10-26 DIAGNOSIS — I059 Rheumatic mitral valve disease, unspecified: Secondary | ICD-10-CM | POA: Diagnosis not present

## 2019-10-26 DIAGNOSIS — Z7901 Long term (current) use of anticoagulants: Secondary | ICD-10-CM | POA: Diagnosis not present

## 2019-10-26 DIAGNOSIS — Z5181 Encounter for therapeutic drug level monitoring: Secondary | ICD-10-CM | POA: Diagnosis not present

## 2019-10-26 LAB — POCT INR: INR: 2.1 (ref 2.0–3.0)

## 2019-10-26 NOTE — Patient Instructions (Signed)
Description   Take 1 tablet today, then continue taking 1/2 tablet daily except for  1 tablet on Fridays. Recheck in 3 weeks. Call [phone] with any medication changes or up coming procedures.

## 2019-11-02 ENCOUNTER — Other Ambulatory Visit: Payer: Self-pay | Admitting: Student in an Organized Health Care Education/Training Program

## 2019-11-02 ENCOUNTER — Ambulatory Visit (HOSPITAL_COMMUNITY)
Admission: RE | Admit: 2019-11-02 | Discharge: 2019-11-02 | Disposition: A | Discharge status: Home / Self Care | Payer: Medicare Other | Source: Ambulatory Visit | Attending: Cardiology | Admitting: Cardiology

## 2019-11-02 ENCOUNTER — Other Ambulatory Visit: Payer: Self-pay

## 2019-11-02 ENCOUNTER — Encounter (HOSPITAL_COMMUNITY): Payer: Self-pay | Admitting: Cardiology

## 2019-11-02 VITALS — BP 100/56 | HR 86 | Wt 149.8 lb

## 2019-11-02 DIAGNOSIS — I5032 Chronic diastolic (congestive) heart failure: Secondary | ICD-10-CM

## 2019-11-02 DIAGNOSIS — N189 Chronic kidney disease, unspecified: Secondary | ICD-10-CM | POA: Diagnosis not present

## 2019-11-02 DIAGNOSIS — D560 Alpha thalassemia: Secondary | ICD-10-CM | POA: Insufficient documentation

## 2019-11-02 DIAGNOSIS — I2510 Atherosclerotic heart disease of native coronary artery without angina pectoris: Secondary | ICD-10-CM | POA: Insufficient documentation

## 2019-11-02 DIAGNOSIS — M109 Gout, unspecified: Secondary | ICD-10-CM | POA: Diagnosis not present

## 2019-11-02 DIAGNOSIS — Z7984 Long term (current) use of oral hypoglycemic drugs: Secondary | ICD-10-CM | POA: Diagnosis not present

## 2019-11-02 DIAGNOSIS — K760 Fatty (change of) liver, not elsewhere classified: Secondary | ICD-10-CM | POA: Diagnosis not present

## 2019-11-02 DIAGNOSIS — Z79899 Other long term (current) drug therapy: Secondary | ICD-10-CM | POA: Diagnosis not present

## 2019-11-02 DIAGNOSIS — Z7901 Long term (current) use of anticoagulants: Secondary | ICD-10-CM | POA: Insufficient documentation

## 2019-11-02 DIAGNOSIS — Z952 Presence of prosthetic heart valve: Secondary | ICD-10-CM | POA: Diagnosis not present

## 2019-11-02 DIAGNOSIS — K219 Gastro-esophageal reflux disease without esophagitis: Secondary | ICD-10-CM | POA: Diagnosis not present

## 2019-11-02 DIAGNOSIS — I130 Hypertensive heart and chronic kidney disease with heart failure and stage 1 through stage 4 chronic kidney disease, or unspecified chronic kidney disease: Secondary | ICD-10-CM | POA: Insufficient documentation

## 2019-11-02 DIAGNOSIS — Z7982 Long term (current) use of aspirin: Secondary | ICD-10-CM | POA: Diagnosis not present

## 2019-11-02 DIAGNOSIS — I482 Chronic atrial fibrillation, unspecified: Secondary | ICD-10-CM | POA: Diagnosis not present

## 2019-11-02 DIAGNOSIS — E785 Hyperlipidemia, unspecified: Secondary | ICD-10-CM | POA: Diagnosis not present

## 2019-11-02 DIAGNOSIS — Z791 Long term (current) use of non-steroidal anti-inflammatories (NSAID): Secondary | ICD-10-CM | POA: Diagnosis not present

## 2019-11-02 LAB — LIPID PANEL
Cholesterol: 158 mg/dL (ref 0–200)
HDL: 56 mg/dL (ref >40)
LDL Cholesterol: 65 mg/dL (ref 0–99)
Total CHOL/HDL Ratio: 2.8 RATIO
Triglycerides: 186 mg/dL — ABNORMAL HIGH (ref <150)
VLDL: 37 mg/dL (ref 0–40)

## 2019-11-02 LAB — COMPREHENSIVE METABOLIC PANEL
ALT: 27 U/L (ref 0–44)
AST: 27 U/L (ref 15–41)
Albumin: 3.9 g/dL (ref 3.5–5.0)
Alkaline Phosphatase: 79 U/L (ref 38–126)
Anion gap: 9 (ref 5–15)
BUN: 19 mg/dL (ref 8–23)
CO2: 25 mmol/L (ref 22–32)
Calcium: 9 mg/dL (ref 8.9–10.3)
Chloride: 106 mmol/L (ref 98–111)
Creatinine, Ser: 1.29 mg/dL — ABNORMAL HIGH (ref 0.44–1.00)
GFR calc Af Amer: 50 mL/min — ABNORMAL LOW (ref >60)
GFR calc non Af Amer: 43 mL/min — ABNORMAL LOW (ref >60)
Glucose, Bld: 175 mg/dL — ABNORMAL HIGH (ref 70–99)
Potassium: 4.1 mmol/L (ref 3.5–5.1)
Sodium: 140 mmol/L (ref 135–145)
Total Bilirubin: 1.2 mg/dL (ref 0.3–1.2)
Total Protein: 7.1 g/dL (ref 6.5–8.1)

## 2019-11-02 LAB — CBC
HCT: 34.9 % — ABNORMAL LOW (ref 36.0–46.0)
Hemoglobin: 10.9 g/dL — ABNORMAL LOW (ref 12.0–15.0)
MCH: 23.9 pg — ABNORMAL LOW (ref 26.0–34.0)
MCHC: 31.2 g/dL (ref 30.0–36.0)
MCV: 76.5 fL — ABNORMAL LOW (ref 80.0–100.0)
Platelets: 184 10*3/uL (ref 150–400)
RBC: 4.56 MIL/uL (ref 3.87–5.11)
RDW: 15.9 % — ABNORMAL HIGH (ref 11.5–15.5)
WBC: 6.1 10*3/uL (ref 4.0–10.5)
nRBC: 0 % (ref 0.0–0.2)

## 2019-11-02 MED ORDER — TORSEMIDE 20 MG PO TABS: 80.0000 mg | ORAL_TABLET | Freq: Two times a day (BID) | ORAL | 5 refills | Status: DC

## 2019-11-02 NOTE — Progress Notes (Signed)
Patient ID: Deborah [name], female   DOB: Nov 06, 1950, 69 y.o.   MRN: [id number] PCP: Dr. Brita Romp Cardiology: Dr. Aundra Dubin  69 y.o. with rheumatic heart disease and MV replacement/TV repair, chronic atrial fibrillation, and chronic diastolic CHF.  Echo in 4/18 showed EF 60-65% with normally functioning mechanical mitral valve.    She is seen periodically also by a cardiologist at an outside facility.   She was admitted in 8/20 with atypical chest pain and mild volume overload (main complaint was chest pain).  Troponin negative.  Coronary CTA was done, showing nonobstructive mild CAD.  Echo showed EF 60-65%, moderate LVH, mildly dilated RV with normal systolic function, s/p TV repair with mild TR, mild AI, mechanical mitral valve with mean gradient 5 mmHg.    She returns for followup of CHF. Weight is stable.  She tries to walk for about 1 hour a day around her yard.  Breathing is stable, no dyspnea with walking on flat ground, dyspnea with moderate activities such as carrying a load.  She has a chronic cough.  No chest pain.  No BRBPR/melena.    ECG (personally reviewed): Atrial fibrillation 60s  Labs (9/17): LDL 87, HDL 61 Labs (12/17): HCT 36.7 Labs (1/18): K 3.7, creatinine 1.09, TSH normal, hgb 11.7 Labs (4/18): K 4.1, creatinine 1.37, hgb 11.9 Labs (11/18): K 4.3, creatinine 1.5, tbili 1.2, ALT/AST normal Labs (12/18): LDL 91, HDL 55, hgb 10.9, K 3.7, creatinine 1.22 Labs (3/19): K 3.9, creatinine 1.35 Labs (8/20): K 4.3, creatinine 1.4, LDL 88 Labs (9/20): K 3.7, creatinine 1.5 Labs (12/20): K 4.1, creatinine 1.39, BNP 127  PMH: 1. Rheumatic heart disease: Status post mechanical MV replacement and tricuspid repair at [facility] in 2002.  Echo 5/12 with EF 60-65%, mild AI, mechanical MV prosthesis with normal function, severe LAE, moderate RAE, moderate to severe TR, mildly elevated PA systolic pressure.  Echo (6/15) EF 55-60%, normal RV, normal mechanical mitral valve, severe LAE, moderate  TR.  Echo (9/16) with EF 55-60%, mild to moderate AI, mechanical mitral valve looks ok, s/p TV repair with mild TR, PASP 40.  - Echo (4/18): EF 60-65%, mechanical mitral valve functioned normally, mild AI, repaired tricuspid valve with mild TR, no TS; PASP 33 mmHg.  - Echo (8/20): EF 60-65%, moderate LVH, mildly dilated RV with normal systolic function, s/p TV repair with mild TR, mild AI, mechanical mitral valve with mean gradient 5 mmHg (normal function).   2. Chronic atrial fibrillation on coumadin.  Event monitor (7/12-8/12) showed no high rate episodes (patient continuously in atrial fibrillation).  3. Chronic diastolic CHF 4. Chest pain: ETT-myoview (7/12): No evidence for ischemia or infarction.  - Coronary CTA (8/20): Nonobstructive CAD, CAC 63 Agatston units (77th percentile).  5. Impaired fasting glucose 6. Hyperlipidemia  7. HTN 8. Gout 9. Anemia 10. External hemorrhoids.  11. 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  13. CKD 14. Fatty liver 15. GERD  SH: Nonsmoker. Lives in [place].   FH: No premature CAD  ROS: All systems reviewed and negative except as noted in HPI.    Current Outpatient Medications  Medication Sig Dispense Refill  . ACCU-CHEK SOFTCLIX LANCETS lancets USE AS DIRECTED TO test blood sugar 100 each 6  . acetaminophen (TYLENOL) 500 MG tablet Take 500-1,000 mg by mouth every 6 (six) hours as needed for fever (pain).    Marland Kitchen allopurinol (ZYLOPRIM) 300 MG tablet TAKE one tablet BY MOUTH EVERY DAY 30  tablet 11  . aspirin 81 MG chewable tablet Chew 81 mg by mouth daily.    . benzonatate (TESSALON) 200 MG capsule Take 1 capsule (200 mg total) by mouth 2 (two) times daily as needed for cough. 60 capsule 2  . cetirizine (ZYRTEC) 10 MG tablet Take 10 mg by mouth daily.    . cyanocobalamin 500 MCG tablet Take 1 tablet (500 mcg total) by mouth daily. 30 tablet 5  . cycloSPORINE (RESTASIS) 0.05 %  ophthalmic emulsion Place 1 drop into both eyes daily as needed.     . diazepam (VALIUM) 5 MG tablet Take 5 mg by mouth every 8 (eight) hours as needed for anxiety.     . diclofenac sodium (VOLTAREN) 1 % GEL Apply 2 g topically 4 (four) times daily. 100 g 3  . famotidine (PEPCID) 20 MG tablet Take 20 mg by mouth daily.    . folic acid (FOLVITE) 1 MG tablet Take 1 tablet (1 mg total) by mouth daily. 90 tablet 1  . glucose blood (ACCU-CHEK AVIVA PLUS) test strip USE TO CHECK BLOOD SUGAR ONCE DAILY. Dx code: E11.9 100 strip 12  . isosorbide mononitrate (IMDUR) 30 MG 24 hr tablet Take 0.5 tablets (15 mg total) by mouth daily. (Patient taking differently: Take 30 mg by mouth daily. ) 15 tablet 0  . meclizine (ANTIVERT) 25 MG tablet TAKE 1 TABLET (25 MG TOTAL) BY MOUTH 3 (THREE) TIMES DAILY AS NEEDED FOR DIZZINESS. 30 tablet 0  . Menthol-Methyl Salicylate (TIGER BALM LINIMENT EX) Apply 1 application topically daily.    . metFORMIN (GLUCOPHAGE) 500 MG tablet TAKE 1 TABLET (500 MG TOTAL) BY MOUTH 2 (TWO) TIMES DAILY WITH A MEAL. 180 tablet 0  . metoprolol succinate (TOPROL-XL) 50 MG 24 hr tablet TAKE 1 TABLET BY MOUTH EVERY DAY 90 tablet 2  . nitroGLYCERIN (NITROSTAT) 0.4 MG SL tablet Place 0.4 mg under the tongue every 5 (five) minutes as needed for chest pain.    Marland Kitchen Olopatadine HCl (PAZEO) 0.7 % SOLN Place 1 drop into both eyes daily as needed (itching/ irritation).    Marland Kitchen omeprazole (PRILOSEC) 20 MG capsule TAKE 1 CAPSULE 2 (TWO) TIMES DAILY BEFORE A MEAL. TAKE WITH OMEPRAZOLE 40 MG TO EQUAL 60 MG 180 capsule 1  . omeprazole (PRILOSEC) 40 MG capsule Take 1 capsule PO BID before meals 60 capsule 0  . potassium chloride SA (KLOR-CON M20) 20 MEQ tablet Take 3 tablets (60 mEq total) by mouth every morning AND 2 tablets (40 mEq total) every evening. 450 tablet 2  . rosuvastatin (CRESTOR) 10 MG tablet Take 1 tablet (10 mg total) by mouth daily. 90 tablet 3  . torsemide (DEMADEX) 20 MG tablet Take 4 tablets (80 mg  total) by mouth 2 (two) times daily. 240 tablet 5  . warfarin (COUMADIN) 2 MG tablet TAKE 1/2-1 TABLET BY MOUTH EVERY DAY OR AS DIRECTED BY COUMADIN CLINIC 30 tablet 3  . zinc sulfate 220 (50 Zn) MG capsule Take 220 mg by mouth daily.     No current facility-administered medications for this encounter.    BP (!) 100/56   Pulse 86   Wt 67.9 kg (149 lb 12.8 oz)   SpO2 96%   BMI 30.26 kg/m  General: NAD Neck: JVP 8 cm with HJR, no thyromegaly or thyroid nodule.  Lungs: Clear to auscultation bilaterally with normal respiratory effort. CV: Nondisplaced PMI.  Heart irregular with mechanical S1, no S3/S4, no murmur.  No peripheral edema.  No carotid  bruit.  Normal pedal pulses.  Abdomen: Soft, nontender, no hepatosplenomegaly, no distention.  Skin: Intact without lesions or rashes.  Neurologic: Alert and oriented x 3.  Psych: Normal affect. Extremities: No clubbing or cyanosis.  HEENT: Normal.   Assessment/Plan: 1. Atrial fibrillation: Chronic.  Continue warfarin.  Continue Toprol XL 50 mg daily.     2. Mechanical mitral valve: Continue ASA 81 daily and coumadin with INR goal 2.5-3.5.  Will need bridging with heparin/Lovenox if has to come off coumadin.  Needs endocarditis prophylaxis with any dental work. Mechanical MV looked ok on echo in 8/20.  - CBC today.  3. Tricuspid valve repair: Stable by echo in 8/20, mild TR.  4. Chronic diastolic CHF:  NYHA class II-III symptoms.  I think that she has mild volume overload on exam.    - Increase torsemide to 80 mg bid with BMET today and in 10 days.  - Cut back on sodium in diet.  - Consider addition of spironolactone in the future.  5. CAD: coronary CTA in 8/20 showed nonobstructive CAD, suspect chest pain was noncardiac.  However, would aim to keep LDL < 70.   - Continue Crestor, check lipids today.   Followup in 3 months.   Marca Ancona 11/02/2019

## 2019-11-02 NOTE — Patient Instructions (Signed)
INCREASE Torsemide 80mg  (4 tabs) twice a day    Labs today We will only contact you if something comes back abnormal or we need to make some changes. Otherwise no news is good news!   Repeat labs in 10 days    Your physician recommends that you schedule a follow-up appointment in: 3 months with Dr   Please call office at [phone] option [phone] if you have any questions or concerns.   At the [facility], you and your health needs are our priority. As part of our continuing mission to provide you with exceptional heart care, we have created designated Provider Care Teams. These Care Teams include your primary Cardiologist (physician) and Advanced Practice Providers (APPs- Physician Assistants and Nurse Practitioners) who all work together to provide you with the care you need, when you need it.   You may see any of the following providers on your designated Care Team at your next follow up: [phone] Dr Marland Kitchen . Dr Arvilla Meres . Marca Ancona, NP . Tonye Becket, PA . Robbie Lis, PharmD   Please be sure to bring in all your medications bottles to every appointment.

## 2019-11-12 ENCOUNTER — Other Ambulatory Visit: Payer: Self-pay

## 2019-11-12 ENCOUNTER — Ambulatory Visit (HOSPITAL_COMMUNITY)
Admission: RE | Admit: 2019-11-12 | Discharge: 2019-11-12 | Disposition: A | Discharge status: Home / Self Care | Payer: Medicare Other | Source: Ambulatory Visit | Attending: Internal Medicine | Admitting: Internal Medicine

## 2019-11-12 DIAGNOSIS — I5032 Chronic diastolic (congestive) heart failure: Secondary | ICD-10-CM

## 2019-11-12 LAB — BASIC METABOLIC PANEL
Anion gap: 10 (ref 5–15)
BUN: 23 mg/dL (ref 8–23)
CO2: 26 mmol/L (ref 22–32)
Calcium: 9.2 mg/dL (ref 8.9–10.3)
Chloride: 103 mmol/L (ref 98–111)
Creatinine, Ser: 1.38 mg/dL — ABNORMAL HIGH (ref 0.44–1.00)
GFR calc Af Amer: 46 mL/min — ABNORMAL LOW (ref >60)
GFR calc non Af Amer: 39 mL/min — ABNORMAL LOW (ref >60)
Glucose, Bld: 260 mg/dL — ABNORMAL HIGH (ref 70–99)
Potassium: 4.2 mmol/L (ref 3.5–5.1)
Sodium: 139 mmol/L (ref 135–145)

## 2019-11-16 ENCOUNTER — Other Ambulatory Visit: Payer: Self-pay | Admitting: Student in an Organized Health Care Education/Training Program

## 2019-11-16 ENCOUNTER — Other Ambulatory Visit: Payer: Self-pay

## 2019-11-16 ENCOUNTER — Ambulatory Visit (INDEPENDENT_AMBULATORY_CARE_PROVIDER_SITE_OTHER): Payer: Medicare Other | Admitting: *Deleted

## 2019-11-16 DIAGNOSIS — Z5181 Encounter for therapeutic drug level monitoring: Secondary | ICD-10-CM

## 2019-11-16 DIAGNOSIS — I059 Rheumatic mitral valve disease, unspecified: Secondary | ICD-10-CM

## 2019-11-16 DIAGNOSIS — Z7901 Long term (current) use of anticoagulants: Secondary | ICD-10-CM | POA: Diagnosis not present

## 2019-11-16 LAB — POCT INR: INR: 2 (ref 2.0–3.0)

## 2019-11-16 NOTE — Patient Instructions (Addendum)
Description   Take 1 tablet today, then start taking 1/2 tablet daily except for 1 tablet on Tuesdays and Saturdays. Recheck in 3 weeks. Call [phone] with any medication changes or up coming procedures.

## 2019-11-24 ENCOUNTER — Other Ambulatory Visit (HOSPITAL_COMMUNITY): Payer: Self-pay | Admitting: Internal Medicine

## 2019-12-07 ENCOUNTER — Ambulatory Visit (INDEPENDENT_AMBULATORY_CARE_PROVIDER_SITE_OTHER): Payer: Medicare Other

## 2019-12-07 ENCOUNTER — Other Ambulatory Visit: Payer: Self-pay

## 2019-12-07 DIAGNOSIS — I059 Rheumatic mitral valve disease, unspecified: Secondary | ICD-10-CM | POA: Diagnosis not present

## 2019-12-07 DIAGNOSIS — Z5181 Encounter for therapeutic drug level monitoring: Secondary | ICD-10-CM | POA: Diagnosis not present

## 2019-12-07 DIAGNOSIS — Z7901 Long term (current) use of anticoagulants: Secondary | ICD-10-CM

## 2019-12-07 LAB — POCT INR: INR: 2.6 (ref 2.0–3.0)

## 2019-12-07 NOTE — Patient Instructions (Signed)
Description   Continue on same dosage 1/2 tablet daily except for 1 tablet on Tuesdays and Saturdays. Recheck in 4 weeks. Call [phone] with any medication changes or up coming procedures.

## 2019-12-11 ENCOUNTER — Telehealth (INDEPENDENT_AMBULATORY_CARE_PROVIDER_SITE_OTHER): Payer: Medicare Other | Admitting: Family Medicine

## 2019-12-11 VITALS — BP 127/88 | Temp 96.6°F | Wt 141.0 lb

## 2019-12-11 DIAGNOSIS — Z7982 Long term (current) use of aspirin: Secondary | ICD-10-CM | POA: Diagnosis not present

## 2019-12-11 DIAGNOSIS — Z1231 Encounter for screening mammogram for malignant neoplasm of breast: Secondary | ICD-10-CM | POA: Diagnosis not present

## 2019-12-11 DIAGNOSIS — R21 Rash and other nonspecific skin eruption: Secondary | ICD-10-CM

## 2019-12-11 NOTE — Progress Notes (Signed)
[facility] Telemedicine Visit  Patient consented to have virtual visit and was identified by name and date of birth. Method of visit: Video was attempted, but technology challenges prevented patient from using video, so visit was conducted via telephone.  Encounter participants: Patient: Deborah [name] - located at home w/ daughter as Nurse, learning disability at there request Provider: Marthenia Rolling - located at [facility]  Chief Complaint: Rash on breast  HPI:  Daughter called saying her mom has been complaining of a 1 week history of a itchy rash on lateral right breast.  No nipple involvement, no breaks in skin, no systemic fevers.  This is a new event for her.    ROS: per HPI  Pertinent PMHx: Overdue for mammogram  Exam:  BP 127/88   Temp (!) 96.6 F (35.9 C) (Oral)   Wt 141 lb (64 kg)   BMI 28.48 kg/m   Respiratory: Unable to evaluate visibly as her daughter was on the phone translating for her, but patient was speaking in full sentences on the phone and her daughter said that she was not in any distress or appearing sick  Assessment/Plan:  Rash in adult [place] over emergency precautions but explained that we could not do a diagnosis for a breast rash over the phone as the video was down.  Schedule patient follow-up in clinic  Encounter for screening mammogram for malignant neoplasm of breast Do not expect cancer etiology to complaint of breast rash today, but did note the patient is overdue for mammogram and have placed order for this with her consent.    Time spent during visit with patient: 7 minutes

## 2019-12-14 ENCOUNTER — Ambulatory Visit (INDEPENDENT_AMBULATORY_CARE_PROVIDER_SITE_OTHER): Payer: Medicare Other | Admitting: Family Medicine

## 2019-12-14 ENCOUNTER — Other Ambulatory Visit: Payer: Self-pay

## 2019-12-14 VITALS — BP 124/70 | HR 72 | Ht 60.0 in | Wt 148.0 lb

## 2019-12-14 DIAGNOSIS — E119 Type 2 diabetes mellitus without complications: Secondary | ICD-10-CM

## 2019-12-14 DIAGNOSIS — N649 Disorder of breast, unspecified: Secondary | ICD-10-CM | POA: Diagnosis not present

## 2019-12-14 DIAGNOSIS — R21 Rash and other nonspecific skin eruption: Secondary | ICD-10-CM | POA: Insufficient documentation

## 2019-12-14 DIAGNOSIS — Z1231 Encounter for screening mammogram for malignant neoplasm of breast: Secondary | ICD-10-CM | POA: Insufficient documentation

## 2019-12-14 LAB — POCT GLYCOSYLATED HEMOGLOBIN (HGB A1C): HbA1c, POC (controlled diabetic range): 7.3 % — AB (ref 0.0–7.0)

## 2019-12-14 LAB — POCT SKIN KOH: Skin KOH, POC: NEGATIVE

## 2019-12-14 MED ORDER — CLOTRIMAZOLE-BETAMETHASONE 1-0.05 % EX CREA: 1.0000 "application " | TOPICAL_CREAM | Freq: Two times a day (BID) | CUTANEOUS | 0 refills | Status: DC

## 2019-12-14 NOTE — Assessment & Plan Note (Signed)
Unclear cause of patient's rest.  Does appear to be somewhat bright light, does not have a peau d'orange appearance.  She has tried Neosporin, hydrocortisone, Benadryl.  Therefore will treat patient with combination of steroid and antifungal to see if there is improvement with this.  Skin scraping showed negative KOH, but there was not much flaking skin to obtain.  We will have her perform Lotrisone cream twice daily x2 weeks, then return for follow-up.  Advised if worsening to come back sooner.

## 2019-12-14 NOTE — Progress Notes (Signed)
    SUBJECTIVE:   CHIEF COMPLAINT / HPI:   Rash on Left Breast Presents with her daughter, who occasionally acts as interpreter at patient's request About 1 month, has been spreading since first started, but no spread to right breast Itches Never had before, thinks 20 years ago she had something similar on her hand Otherwise feeling well, no fevers Has tried benadryl, neosporin, hydrocortisone without improvement Denies pustule or papule formation   PERTINENT  PMH / PSH: Atrial fibrillation, CHF, fatty liver, diabetes, HLD, prosthetic heart valve  OBJECTIVE:   BP 124/70   Pulse 72   Ht 5' (1.524 m)   Wt 148 lb (67.1 kg)   SpO2 97%   BMI 28.90 kg/m    Physical Exam:  General: 69 y.o. female in NAD Lungs: Breathing comfortably on room air Skin: few pinpoint excoriations on medial left breast, no surrounding skin changes, no skin erythema Extremities: No edema  Results for orders placed or performed in visit on 12/14/19 (from the past 24 hour(s))  POCT glycosylated hemoglobin (Hb A1C)     Status: Abnormal   Collection Time: 12/14/19  8:56 AM  Result Value Ref Range   Hemoglobin A1C     HbA1c POC (<> result, manual entry)     HbA1c, POC (prediabetic range)     HbA1c, POC (controlled diabetic range) 7.3 (A) 0.0 - 7.0 %  POCT Skin KOH     Status: None   Collection Time: 12/14/19  9:15 AM  Result Value Ref Range   Skin KOH, POC Negative Negative     ASSESSMENT/PLAN:   Rash and nonspecific skin eruption Unclear cause of patient's rest.  Does appear to be somewhat bright light, does not have a peau d'orange appearance.  She has tried Neosporin, hydrocortisone, Benadryl.  Therefore will treat patient with combination of steroid and antifungal to see if there is improvement with this.  Skin scraping showed negative KOH, but there was not much flaking skin to obtain.  We will have her perform Lotrisone cream twice daily x2 weeks, then return for follow-up.  Advised if  worsening to come back sooner.  Breast lesion Given that patient's rashes on her breast, there is concern for possible changes secondary to breast cancer, although does not appear to be similar in appearance to inflammatory breast cancer or Paget's disease of the breast.  She has not had a mammogram since 2015, therefore discussed with Dr. Lum Babe and will refer patient for diagnostic mammogram.  Patient and her daughter made aware of this.  Follow-up PCP 2 to 4 weeks.  DM (diabetes mellitus) A1c 7.3 today.  Advised follow-up with PCP in 2 to 4 weeks.     Unknown Jim, DO [facility]

## 2019-12-14 NOTE — Assessment & Plan Note (Signed)
Do not expect cancer etiology to complaint of breast rash today, but did note the patient is overdue for mammogram and have placed order for this with her consent.

## 2019-12-14 NOTE — Assessment & Plan Note (Signed)
A1c 7.3 today.  Advised follow-up with PCP in 2 to 4 weeks.

## 2019-12-14 NOTE — Assessment & Plan Note (Signed)
Given that patient's rashes on her breast, there is concern for possible changes secondary to breast cancer, although does not appear to be similar in appearance to inflammatory breast cancer or Paget's disease of the breast.  She has not had a mammogram since 2015, therefore discussed with Dr. Lum Babe and will refer patient for diagnostic mammogram.  Patient and her daughter made aware of this.  Follow-up PCP 2 to 4 weeks.

## 2019-12-14 NOTE — Assessment & Plan Note (Signed)
Went over emergency precautions but explained that we could not do a diagnosis for a breast rash over the phone as the video was down.  Schedule patient follow-up in clinic

## 2019-12-14 NOTE — Patient Instructions (Signed)
Thank you for coming to see me today. It was a pleasure. Today we talked about:   We will treat your rash with a combined antifungal and steroid.  Use this twice a day for the next 2 weeks.  We will also order a diagnostic mammogram.  Your A1c was 7.3.  You should come back to talk with your PCP about this.  Please follow-up with your PCP in 2 to 4 weeks.  If you have any questions or concerns, please do not hesitate to call the office at [phone].  Best,   Luis Abed, DO

## 2019-12-16 ENCOUNTER — Other Ambulatory Visit: Payer: Self-pay | Admitting: Family Medicine

## 2019-12-16 DIAGNOSIS — N649 Disorder of breast, unspecified: Secondary | ICD-10-CM

## 2019-12-25 ENCOUNTER — Other Ambulatory Visit (HOSPITAL_COMMUNITY): Payer: Self-pay | Admitting: Internal Medicine

## 2019-12-28 ENCOUNTER — Other Ambulatory Visit: Payer: Medicare Other

## 2020-01-04 ENCOUNTER — Other Ambulatory Visit: Payer: Self-pay

## 2020-01-04 ENCOUNTER — Ambulatory Visit (INDEPENDENT_AMBULATORY_CARE_PROVIDER_SITE_OTHER): Payer: Medicare Other | Admitting: *Deleted

## 2020-01-04 DIAGNOSIS — I059 Rheumatic mitral valve disease, unspecified: Secondary | ICD-10-CM | POA: Diagnosis not present

## 2020-01-04 DIAGNOSIS — Z7901 Long term (current) use of anticoagulants: Secondary | ICD-10-CM

## 2020-01-04 DIAGNOSIS — Z5181 Encounter for therapeutic drug level monitoring: Secondary | ICD-10-CM

## 2020-01-04 LAB — POCT INR: INR: 3.1 — AB (ref 2.0–3.0)

## 2020-01-04 NOTE — Patient Instructions (Signed)
Description   Continue on same dosage 1/2 tablet daily except for 1 tablet on Tuesdays and Saturdays. Recheck in 5 weeks. Call [phone] with any medication changes or up coming procedures.

## 2020-01-30 ENCOUNTER — Other Ambulatory Visit: Payer: Self-pay | Admitting: Student in an Organized Health Care Education/Training Program

## 2020-02-02 ENCOUNTER — Ambulatory Visit (HOSPITAL_COMMUNITY)
Admission: RE | Admit: 2020-02-02 | Discharge: 2020-02-02 | Disposition: A | Discharge status: Home / Self Care | Payer: Medicare Other | Source: Ambulatory Visit | Attending: Cardiology | Admitting: Cardiology

## 2020-02-02 ENCOUNTER — Encounter (HOSPITAL_COMMUNITY): Payer: Self-pay | Admitting: Cardiology

## 2020-02-02 ENCOUNTER — Other Ambulatory Visit: Payer: Self-pay

## 2020-02-02 VITALS — BP 104/62 | HR 72 | Wt 146.6 lb

## 2020-02-02 DIAGNOSIS — Z7982 Long term (current) use of aspirin: Secondary | ICD-10-CM | POA: Insufficient documentation

## 2020-02-02 DIAGNOSIS — M109 Gout, unspecified: Secondary | ICD-10-CM | POA: Diagnosis not present

## 2020-02-02 DIAGNOSIS — Z7901 Long term (current) use of anticoagulants: Secondary | ICD-10-CM | POA: Diagnosis not present

## 2020-02-02 DIAGNOSIS — Z79899 Other long term (current) drug therapy: Secondary | ICD-10-CM | POA: Insufficient documentation

## 2020-02-02 DIAGNOSIS — Z7984 Long term (current) use of oral hypoglycemic drugs: Secondary | ICD-10-CM | POA: Diagnosis not present

## 2020-02-02 DIAGNOSIS — I482 Chronic atrial fibrillation, unspecified: Secondary | ICD-10-CM | POA: Diagnosis not present

## 2020-02-02 DIAGNOSIS — D560 Alpha thalassemia: Secondary | ICD-10-CM | POA: Diagnosis not present

## 2020-02-02 DIAGNOSIS — K219 Gastro-esophageal reflux disease without esophagitis: Secondary | ICD-10-CM | POA: Diagnosis not present

## 2020-02-02 DIAGNOSIS — E785 Hyperlipidemia, unspecified: Secondary | ICD-10-CM | POA: Insufficient documentation

## 2020-02-02 DIAGNOSIS — N189 Chronic kidney disease, unspecified: Secondary | ICD-10-CM | POA: Diagnosis not present

## 2020-02-02 DIAGNOSIS — D649 Anemia, unspecified: Secondary | ICD-10-CM | POA: Diagnosis not present

## 2020-02-02 DIAGNOSIS — I5032 Chronic diastolic (congestive) heart failure: Secondary | ICD-10-CM | POA: Diagnosis not present

## 2020-02-02 DIAGNOSIS — K760 Fatty (change of) liver, not elsewhere classified: Secondary | ICD-10-CM | POA: Diagnosis not present

## 2020-02-02 DIAGNOSIS — Z952 Presence of prosthetic heart valve: Secondary | ICD-10-CM | POA: Diagnosis not present

## 2020-02-02 DIAGNOSIS — I2510 Atherosclerotic heart disease of native coronary artery without angina pectoris: Secondary | ICD-10-CM | POA: Diagnosis not present

## 2020-02-02 DIAGNOSIS — I130 Hypertensive heart and chronic kidney disease with heart failure and stage 1 through stage 4 chronic kidney disease, or unspecified chronic kidney disease: Secondary | ICD-10-CM | POA: Insufficient documentation

## 2020-02-02 DIAGNOSIS — R309 Painful micturition, unspecified: Secondary | ICD-10-CM

## 2020-02-02 LAB — URINALYSIS, ROUTINE W REFLEX MICROSCOPIC
Bilirubin Urine: NEGATIVE
Glucose, UA: NEGATIVE mg/dL
Hgb urine dipstick: NEGATIVE
Ketones, ur: NEGATIVE mg/dL
Leukocytes,Ua: NEGATIVE
Nitrite: NEGATIVE
Protein, ur: NEGATIVE mg/dL
Specific Gravity, Urine: 1.006 (ref 1.005–1.030)
pH: 5 (ref 5.0–8.0)

## 2020-02-02 LAB — BASIC METABOLIC PANEL
Anion gap: 11 (ref 5–15)
BUN: 23 mg/dL (ref 8–23)
CO2: 24 mmol/L (ref 22–32)
Calcium: 9.1 mg/dL (ref 8.9–10.3)
Chloride: 104 mmol/L (ref 98–111)
Creatinine, Ser: 1.24 mg/dL — ABNORMAL HIGH (ref 0.44–1.00)
GFR calc Af Amer: 52 mL/min — ABNORMAL LOW (ref >60)
GFR calc non Af Amer: 45 mL/min — ABNORMAL LOW (ref >60)
Glucose, Bld: 186 mg/dL — ABNORMAL HIGH (ref 70–99)
Potassium: 3.6 mmol/L (ref 3.5–5.1)
Sodium: 139 mmol/L (ref 135–145)

## 2020-02-02 NOTE — Patient Instructions (Signed)
Labs done today, your results will be available in MyChart, we will contact you for abnormal readings. ° °Your physician recommends that you schedule a follow-up appointment in: 3 months with echocardiogram ° °If you have any questions or concerns before your next appointment please send us a message through mychart or call our office at [phone].   ° °TO LEAVE A MESSAGE FOR THE NURSE SELECT OPTION 2, PLEASE LEAVE A MESSAGE INCLUDING: °• YOUR NAME °• DATE OF BIRTH °• CALL BACK NUMBER °• REASON FOR CALL**this is important as we prioritize the call backs ° °YOU WILL RECEIVE A CALL BACK THE SAME DAY AS LONG AS YOU CALL BEFORE 4:00 PM ° °At the [facility], you and your health needs are our priority. As part of our continuing mission to provide you with exceptional heart care, we have created designated Provider Care Teams. These Care Teams include your primary Cardiologist (physician) and Advanced Practice Providers (APPs- Physician Assistants and Nurse Practitioners) who all work together to provide you with the care you need, when you need it.  ° °You may see any of the following providers on your designated Care Team at your next follow up: °• Dr Daniel Bensimhon °• Dr Dalton McLean °• Amy Clegg, NP °• Brittainy Simmons, PA °• Lauren Kemp, PharmD ° ° °Please be sure to bring in all your medications bottles to every appointment.  ° ° ° °

## 2020-02-02 NOTE — Progress Notes (Signed)
Patient ID: Deborah [name], female   DOB: 1951/01/09, 69 y.o.   MRN: [id number] PCP: Dr. Brita Romp Cardiology: Dr. Aundra Dubin  69 y.o. with rheumatic heart disease and MV replacement/TV repair, chronic atrial fibrillation, and chronic diastolic CHF.  Echo in 4/18 showed EF 60-65% with normally functioning mechanical mitral valve.    She was admitted in 8/20 with atypical chest pain and mild volume overload (main complaint was chest pain).  Troponin negative.  Coronary CTA was done, showing nonobstructive mild CAD.  Echo showed EF 60-65%, moderate LVH, mildly dilated RV with normal systolic function, s/p TV repair with mild TR, mild AI, mechanical mitral valve with mean gradient 5 mmHg.    She returns for followup of CHF. Weight is down 2 lbs.  No dyspnea walking on flat ground.  Dyspnea with hills and stairs, no change.  She walks for exercise about 5 times/week.  Rare atypical chest pain.  No orthopnea/PND.  Main complaint is low back pain.    Labs (9/17): LDL 87, HDL 61 Labs (12/17): HCT 36.7 Labs (1/18): K 3.7, creatinine 1.09, TSH normal, hgb 11.7 Labs (4/18): K 4.1, creatinine 1.37, hgb 11.9 Labs (11/18): K 4.3, creatinine 1.5, tbili 1.2, ALT/AST normal Labs (12/18): LDL 91, HDL 55, hgb 10.9, K 3.7, creatinine 1.22 Labs (3/19): K 3.9, creatinine 1.35 Labs (8/20): K 4.3, creatinine 1.4, LDL 88 Labs (9/20): K 3.7, creatinine 1.5 Labs (12/20): K 4.1, creatinine 1.39, BNP 127 Labs (3/21): K 4.2, creatinine 1.38, LDL 65, Hgb 10.7  PMH: 1. Rheumatic heart disease: Status post mechanical MV replacement and tricuspid repair at [facility] in 2002.  Echo 5/12 with EF 60-65%, mild AI, mechanical MV prosthesis with normal function, severe LAE, moderate RAE, moderate to severe TR, mildly elevated PA systolic pressure.  Echo (6/15) EF 55-60%, normal RV, normal mechanical mitral valve, severe LAE, moderate TR.  Echo (9/16) with EF 55-60%, mild to moderate AI, mechanical mitral valve looks ok, s/p TV repair with  mild TR, PASP 40.  - Echo (4/18): EF 60-65%, mechanical mitral valve functioned normally, mild AI, repaired tricuspid valve with mild TR, no TS; PASP 33 mmHg.  - Echo (8/20): EF 60-65%, moderate LVH, mildly dilated RV with normal systolic function, s/p TV repair with mild TR, mild AI, mechanical mitral valve with mean gradient 5 mmHg (normal function).   2. Chronic atrial fibrillation on coumadin.  Event monitor (7/12-8/12) showed no high rate episodes (patient continuously in atrial fibrillation).  3. Chronic diastolic CHF 4. Chest pain: ETT-myoview (7/12): No evidence for ischemia or infarction.  - Coronary CTA (8/20): Nonobstructive CAD, CAC 63 Agatston units (77th percentile).  5. Impaired fasting glucose 6. Hyperlipidemia  7. HTN 8. Gout 9. Anemia 10. External hemorrhoids.  11. 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  13. CKD 14. Fatty liver 15. GERD  SH: Nonsmoker. Lives in [place].   FH: No premature CAD  ROS: All systems reviewed and negative except as noted in HPI.    Current Outpatient Medications  Medication Sig Dispense Refill  . ACCU-CHEK SOFTCLIX LANCETS lancets USE AS DIRECTED TO test blood sugar 100 each 6  . acetaminophen (TYLENOL) 500 MG tablet Take 500-1,000 mg by mouth every 6 (six) hours as needed for fever (pain).    Marland Kitchen allopurinol (ZYLOPRIM) 300 MG tablet TAKE one tablet BY MOUTH EVERY DAY 30 tablet 11  . aspirin 81 MG chewable tablet Chew 81 mg by mouth daily.    Marland Kitchen  cetirizine (ZYRTEC) 10 MG tablet Take 10 mg by mouth daily.    . cyanocobalamin 500 MCG tablet Take 1 tablet (500 mcg total) by mouth daily. 30 tablet 5  . cycloSPORINE (RESTASIS) 0.05 % ophthalmic emulsion Place 1 drop into both eyes daily as needed.     . diazepam (VALIUM) 5 MG tablet Take 5 mg by mouth every 8 (eight) hours as needed for anxiety.     . diclofenac sodium (VOLTAREN) 1 % GEL Apply 2 g topically 4 (four) times  daily. 100 g 3  . famotidine (PEPCID) 20 MG tablet Take 20 mg by mouth daily.    . folic acid (FOLVITE) 1 MG tablet TAKE 1 TABLET BY MOUTH EVERY DAY 90 tablet 1  . glucose blood (ACCU-CHEK AVIVA PLUS) test strip USE TO CHECK BLOOD SUGAR ONCE DAILY. Dx code: E11.9 100 strip 12  . isosorbide mononitrate (IMDUR) 30 MG 24 hr tablet Take 30 mg by mouth daily.    . meclizine (ANTIVERT) 25 MG tablet TAKE 1 TABLET (25 MG TOTAL) BY MOUTH 3 (THREE) TIMES DAILY AS NEEDED FOR DIZZINESS. 30 tablet 0  . Menthol-Methyl Salicylate (TIGER BALM LINIMENT EX) Apply 1 application topically daily.    . metFORMIN (GLUCOPHAGE) 500 MG tablet TAKE 1 TABLET (500 MG TOTAL) BY MOUTH 2 (TWO) TIMES DAILY WITH A MEAL. 180 tablet 1  . metoprolol succinate (TOPROL-XL) 50 MG 24 hr tablet TAKE 1 TABLET BY MOUTH EVERY DAY 90 tablet 3  . nitroGLYCERIN (NITROSTAT) 0.4 MG SL tablet Place 0.4 mg under the tongue every 5 (five) minutes as needed for chest pain.    Marland Kitchen Olopatadine HCl (PAZEO) 0.7 % SOLN Place 1 drop into both eyes daily as needed (itching/ irritation).    Marland Kitchen omeprazole (PRILOSEC) 20 MG capsule TAKE 1 CAPSULE 2 (TWO) TIMES DAILY BEFORE A MEAL. TAKE WITH OMEPRAZOLE 40 MG TO EQUAL 60 MG 180 capsule 1  . omeprazole (PRILOSEC) 40 MG capsule Take 1 capsule PO BID before meals 60 capsule 0  . potassium chloride SA (KLOR-CON M20) 20 MEQ tablet Take 3 tablets (60 mEq total) by mouth every morning AND 2 tablets (40 mEq total) every evening. 450 tablet 2  . rosuvastatin (CRESTOR) 10 MG tablet Take 1 tablet (10 mg total) by mouth daily. 90 tablet 3  . torsemide (DEMADEX) 20 MG tablet Take 4 tablets by mouth every morning and 3 tablets by mouth every evening    . warfarin (COUMADIN) 2 MG tablet TAKE 1/2-1 TABLET BY MOUTH EVERY DAY OR AS DIRECTED BY COUMADIN CLINIC 30 tablet 3  . zinc sulfate 220 (50 Zn) MG capsule Take 220 mg by mouth daily.     No current facility-administered medications for this encounter.    BP 104/62   Pulse 72    Wt 66.5 kg (146 lb 9.6 oz)   SpO2 96%   BMI 28.63 kg/m  General: NAD Neck: No JVD, no thyromegaly or thyroid nodule.  Lungs: Clear to auscultation bilaterally with normal respiratory effort. CV: Nondisplaced PMI.  Heart irregular S1/S2 with mechanical S1, no S3/S4, no murmur.  No peripheral edema.  No carotid bruit.  Normal pedal pulses.  Abdomen: Soft, nontender, no hepatosplenomegaly, no distention.  Skin: Intact without lesions or rashes.  Neurologic: Alert and oriented x 3.  Psych: Normal affect. Extremities: No clubbing or cyanosis.  HEENT: Normal.   Assessment/Plan: 1. Atrial fibrillation: Chronic.  Continue warfarin.  Continue Toprol XL 50 mg daily.     2. Mechanical mitral  valve: Continue ASA 81 daily and coumadin with INR goal 2.5-3.5.  Will need bridging with heparin/Lovenox if has to come off coumadin.  Needs endocarditis prophylaxis with any dental work. Mechanical MV looked ok on echo in 8/20.  - CBC today.  3. Tricuspid valve repair: Stable by echo in 8/20, mild TR.  4. Chronic diastolic CHF:  NYHA class II.  She is not volume overloaded on exam.     - Continue torsemide 80 qam/60 qpm.  BMET today.  - Echo at followup in 3 months.  5. CAD: coronary CTA in 8/20 showed nonobstructive CAD, suspect chest pain was noncardiac.  However, would aim to keep LDL < 70.   - Continue Crestor, good lipids in 3/21.   Followup in 3 months with echo.   Marca Ancona 02/02/2020

## 2020-02-04 ENCOUNTER — Encounter (HOSPITAL_COMMUNITY): Payer: Self-pay | Admitting: *Deleted

## 2020-02-08 ENCOUNTER — Other Ambulatory Visit: Payer: Self-pay

## 2020-02-08 ENCOUNTER — Ambulatory Visit (INDEPENDENT_AMBULATORY_CARE_PROVIDER_SITE_OTHER): Payer: Medicare Other | Admitting: *Deleted

## 2020-02-08 DIAGNOSIS — Z5181 Encounter for therapeutic drug level monitoring: Secondary | ICD-10-CM

## 2020-02-08 DIAGNOSIS — Z7901 Long term (current) use of anticoagulants: Secondary | ICD-10-CM

## 2020-02-08 DIAGNOSIS — I059 Rheumatic mitral valve disease, unspecified: Secondary | ICD-10-CM

## 2020-02-08 LAB — POCT INR: INR: 2.3 (ref 2.0–3.0)

## 2020-02-08 NOTE — Patient Instructions (Addendum)
Description   Today take 1 tablet then continue on same dosage 1/2 tablet daily except for 1 tablet on Tuesdays and Saturdays. Recheck in 5 weeks. Call [phone] with any medication changes or up coming procedures.

## 2020-02-24 ENCOUNTER — Other Ambulatory Visit (HOSPITAL_COMMUNITY): Payer: Self-pay | Admitting: Cardiology

## 2020-03-14 ENCOUNTER — Ambulatory Visit (INDEPENDENT_AMBULATORY_CARE_PROVIDER_SITE_OTHER): Payer: Medicare Other | Admitting: *Deleted

## 2020-03-14 ENCOUNTER — Other Ambulatory Visit: Payer: Self-pay

## 2020-03-14 DIAGNOSIS — I059 Rheumatic mitral valve disease, unspecified: Secondary | ICD-10-CM

## 2020-03-14 DIAGNOSIS — Z7901 Long term (current) use of anticoagulants: Secondary | ICD-10-CM

## 2020-03-14 DIAGNOSIS — Z5181 Encounter for therapeutic drug level monitoring: Secondary | ICD-10-CM

## 2020-03-14 LAB — POCT INR: INR: 2.7 (ref 2.0–3.0)

## 2020-03-14 NOTE — Patient Instructions (Signed)
Description   Continue on same dosage 1/2 tablet daily except for 1 tablet on Tuesdays and Saturdays. Recheck in 6 weeks. Call [phone] with any medication changes or up coming procedures.

## 2020-03-20 ENCOUNTER — Other Ambulatory Visit (HOSPITAL_COMMUNITY): Payer: Self-pay | Admitting: Cardiology

## 2020-03-21 ENCOUNTER — Other Ambulatory Visit (HOSPITAL_COMMUNITY): Payer: Self-pay | Admitting: Cardiology

## 2020-03-22 ENCOUNTER — Other Ambulatory Visit: Payer: Self-pay | Admitting: Family Medicine

## 2020-03-22 ENCOUNTER — Other Ambulatory Visit (HOSPITAL_COMMUNITY): Payer: Self-pay | Admitting: Cardiology

## 2020-03-28 ENCOUNTER — Other Ambulatory Visit (HOSPITAL_COMMUNITY): Payer: Self-pay | Admitting: Internal Medicine

## 2020-03-28 ENCOUNTER — Other Ambulatory Visit (HOSPITAL_COMMUNITY): Payer: Self-pay | Admitting: Cardiology

## 2020-04-15 ENCOUNTER — Other Ambulatory Visit: Payer: Self-pay | Admitting: Family Medicine

## 2020-04-25 ENCOUNTER — Ambulatory Visit (INDEPENDENT_AMBULATORY_CARE_PROVIDER_SITE_OTHER): Payer: Medicare Other | Admitting: Pharmacist

## 2020-04-25 ENCOUNTER — Other Ambulatory Visit: Payer: Self-pay

## 2020-04-25 DIAGNOSIS — Z7901 Long term (current) use of anticoagulants: Secondary | ICD-10-CM

## 2020-04-25 DIAGNOSIS — I059 Rheumatic mitral valve disease, unspecified: Secondary | ICD-10-CM | POA: Diagnosis not present

## 2020-04-25 DIAGNOSIS — Z5181 Encounter for therapeutic drug level monitoring: Secondary | ICD-10-CM

## 2020-04-25 LAB — POCT INR: INR: 2.1 (ref 2.0–3.0)

## 2020-04-25 NOTE — Patient Instructions (Signed)
Take 1 tablet today, then begin taking 1/2 tablet daily except for 1 tablet on Tuesdays, Thursdays and Saturdays. Recheck in 3 weeks. Call [phone] with any medication changes or up coming procedures.

## 2020-05-01 ENCOUNTER — Other Ambulatory Visit (HOSPITAL_COMMUNITY): Payer: Self-pay | Admitting: Cardiology

## 2020-05-09 ENCOUNTER — Other Ambulatory Visit (HOSPITAL_COMMUNITY): Payer: Self-pay | Admitting: Cardiology

## 2020-05-13 ENCOUNTER — Other Ambulatory Visit: Payer: Self-pay

## 2020-05-13 ENCOUNTER — Ambulatory Visit (INDEPENDENT_AMBULATORY_CARE_PROVIDER_SITE_OTHER): Payer: Medicare Other | Admitting: *Deleted

## 2020-05-13 DIAGNOSIS — I059 Rheumatic mitral valve disease, unspecified: Secondary | ICD-10-CM

## 2020-05-13 DIAGNOSIS — Z7901 Long term (current) use of anticoagulants: Secondary | ICD-10-CM | POA: Diagnosis not present

## 2020-05-13 DIAGNOSIS — Z5181 Encounter for therapeutic drug level monitoring: Secondary | ICD-10-CM | POA: Diagnosis not present

## 2020-05-13 LAB — POCT INR: INR: 3.4 — AB (ref 2.0–3.0)

## 2020-05-13 MED ORDER — WARFARIN SODIUM 2 MG PO TABS: ORAL_TABLET | ORAL | 3 refills | Status: DC

## 2020-05-13 NOTE — Patient Instructions (Signed)
Description   Continue taking 1/2 tablet daily except for 1 tablet on Tuesdays, Thursdays and Saturdays. Recheck in 4 weeks. Call [phone] with any medication changes or up coming procedures.

## 2020-05-16 ENCOUNTER — Ambulatory Visit (HOSPITAL_BASED_OUTPATIENT_CLINIC_OR_DEPARTMENT_OTHER)
Admission: RE | Admit: 2020-05-16 | Discharge: 2020-05-16 | Disposition: A | Discharge status: Home / Self Care | Payer: Medicare Other | Source: Ambulatory Visit | Attending: Cardiology | Admitting: Cardiology

## 2020-05-16 ENCOUNTER — Other Ambulatory Visit: Payer: Self-pay

## 2020-05-16 ENCOUNTER — Ambulatory Visit (HOSPITAL_COMMUNITY)
Admission: RE | Admit: 2020-05-16 | Discharge: 2020-05-16 | Disposition: A | Discharge status: Home / Self Care | Payer: Medicare Other | Source: Ambulatory Visit | Attending: Cardiology | Admitting: Cardiology

## 2020-05-16 VITALS — BP 114/64 | HR 80 | Wt 145.4 lb

## 2020-05-16 DIAGNOSIS — Z7901 Long term (current) use of anticoagulants: Secondary | ICD-10-CM | POA: Diagnosis not present

## 2020-05-16 DIAGNOSIS — I099 Rheumatic heart disease, unspecified: Secondary | ICD-10-CM | POA: Diagnosis not present

## 2020-05-16 DIAGNOSIS — K219 Gastro-esophageal reflux disease without esophagitis: Secondary | ICD-10-CM | POA: Diagnosis not present

## 2020-05-16 DIAGNOSIS — E785 Hyperlipidemia, unspecified: Secondary | ICD-10-CM | POA: Diagnosis not present

## 2020-05-16 DIAGNOSIS — D560 Alpha thalassemia: Secondary | ICD-10-CM | POA: Diagnosis not present

## 2020-05-16 DIAGNOSIS — Z7982 Long term (current) use of aspirin: Secondary | ICD-10-CM | POA: Diagnosis not present

## 2020-05-16 DIAGNOSIS — I130 Hypertensive heart and chronic kidney disease with heart failure and stage 1 through stage 4 chronic kidney disease, or unspecified chronic kidney disease: Secondary | ICD-10-CM | POA: Diagnosis not present

## 2020-05-16 DIAGNOSIS — I082 Rheumatic disorders of both aortic and tricuspid valves: Secondary | ICD-10-CM | POA: Diagnosis not present

## 2020-05-16 DIAGNOSIS — Z7984 Long term (current) use of oral hypoglycemic drugs: Secondary | ICD-10-CM | POA: Insufficient documentation

## 2020-05-16 DIAGNOSIS — N189 Chronic kidney disease, unspecified: Secondary | ICD-10-CM | POA: Diagnosis not present

## 2020-05-16 DIAGNOSIS — Z791 Long term (current) use of non-steroidal anti-inflammatories (NSAID): Secondary | ICD-10-CM | POA: Diagnosis not present

## 2020-05-16 DIAGNOSIS — R0789 Other chest pain: Secondary | ICD-10-CM | POA: Diagnosis not present

## 2020-05-16 DIAGNOSIS — K760 Fatty (change of) liver, not elsewhere classified: Secondary | ICD-10-CM | POA: Insufficient documentation

## 2020-05-16 DIAGNOSIS — Z79899 Other long term (current) drug therapy: Secondary | ICD-10-CM | POA: Insufficient documentation

## 2020-05-16 DIAGNOSIS — I482 Chronic atrial fibrillation, unspecified: Secondary | ICD-10-CM

## 2020-05-16 DIAGNOSIS — Z952 Presence of prosthetic heart valve: Secondary | ICD-10-CM | POA: Insufficient documentation

## 2020-05-16 DIAGNOSIS — I5032 Chronic diastolic (congestive) heart failure: Secondary | ICD-10-CM | POA: Insufficient documentation

## 2020-05-16 DIAGNOSIS — I2510 Atherosclerotic heart disease of native coronary artery without angina pectoris: Secondary | ICD-10-CM | POA: Insufficient documentation

## 2020-05-16 DIAGNOSIS — I059 Rheumatic mitral valve disease, unspecified: Secondary | ICD-10-CM | POA: Diagnosis not present

## 2020-05-16 LAB — CBC
HCT: 38.1 % (ref 36.0–46.0)
Hemoglobin: 11.5 g/dL — ABNORMAL LOW (ref 12.0–15.0)
MCH: 23.2 pg — ABNORMAL LOW (ref 26.0–34.0)
MCHC: 30.2 g/dL (ref 30.0–36.0)
MCV: 77 fL — ABNORMAL LOW (ref 80.0–100.0)
Platelets: 134 10*3/uL — ABNORMAL LOW (ref 150–400)
RBC: 4.95 MIL/uL (ref 3.87–5.11)
RDW: 16 % — ABNORMAL HIGH (ref 11.5–15.5)
WBC: 7.4 10*3/uL (ref 4.0–10.5)
nRBC: 0 % (ref 0.0–0.2)

## 2020-05-16 LAB — BASIC METABOLIC PANEL
Anion gap: 11 (ref 5–15)
BUN: 22 mg/dL (ref 8–23)
CO2: 25 mmol/L (ref 22–32)
Calcium: 9.6 mg/dL (ref 8.9–10.3)
Chloride: 105 mmol/L (ref 98–111)
Creatinine, Ser: 1.43 mg/dL — ABNORMAL HIGH (ref 0.44–1.00)
GFR calc Af Amer: 44 mL/min — ABNORMAL LOW (ref >60)
GFR calc non Af Amer: 38 mL/min — ABNORMAL LOW (ref >60)
Glucose, Bld: 168 mg/dL — ABNORMAL HIGH (ref 70–99)
Potassium: 3.8 mmol/L (ref 3.5–5.1)
Sodium: 141 mmol/L (ref 135–145)

## 2020-05-16 LAB — ECHOCARDIOGRAM COMPLETE
Area-P 1/2: 2.6 cm2
P 1/2 time: 501 msec
S' Lateral: 2.3 cm

## 2020-05-16 MED ORDER — TORSEMIDE 20 MG PO TABS
80.0000 mg | ORAL_TABLET | Freq: Two times a day (BID) | ORAL | 1 refills | Status: DC
Start: 2020-05-16 — End: 2020-07-11

## 2020-05-16 MED ORDER — POTASSIUM CHLORIDE CRYS ER 20 MEQ PO TBCR: 60.0000 meq | EXTENDED_RELEASE_TABLET | Freq: Two times a day (BID) | ORAL | 2 refills | Status: AC

## 2020-05-16 NOTE — Patient Instructions (Signed)
Increase Torsemide to 80 mg (4 tabs) Twice daily   Increase Potassium to 60 meq (3 tabs) Twice daily   Labs done today, your results will be available in MyChart, we will contact you for abnormal readings.  Your physician recommends that you return for lab work in: 10-14 days  Your physician recommends that you schedule a follow-up appointment in: 4 weeks  If you have any questions or concerns before your next appointment please send [place] a message through [place] or call our office at [phone].    TO LEAVE A MESSAGE FOR THE NURSE SELECT OPTION 2, PLEASE LEAVE A MESSAGE INCLUDING: . YOUR NAME . DATE OF BIRTH . CALL BACK NUMBER . REASON FOR CALL**this is important as we prioritize the call backs  YOU WILL RECEIVE A CALL BACK THE SAME DAY AS LONG AS YOU CALL BEFORE 4:00 PM  At the [facility], you and your health needs are our priority. As part of our continuing mission to provide you with exceptional heart care, we have created designated Provider Care Teams. These Care Teams include your primary Cardiologist (physician) and Advanced Practice Providers (APPs- Physician Assistants and Nurse Practitioners) who all work together to provide you with the care you need, when you need it.   You may see any of the following providers on your designated Care Team at your next follow up: Marland Kitchen Dr Arvilla Meres . Dr Marca Ancona . Tonye Becket, NP . Robbie Lis, PA . Karle Plumber, PharmD   Please be sure to bring in all your medications bottles to every appointment.

## 2020-05-16 NOTE — Progress Notes (Signed)
  Echocardiogram 2D Echocardiogram has been performed.  Augustine Radar 05/16/2020, 10:56 AM

## 2020-05-16 NOTE — Progress Notes (Signed)
Patient ID: Deborah [name], female   DOB: [birth year], 69 y.o.   MRN: [id number] PCP: Dr. Beryle Flock Cardiology: Dr. [name]  69 y.o. with rheumatic heart disease and MV replacement/TV repair, chronic atrial fibrillation, and chronic diastolic CHF.  Echo in 4/18 showed EF 60-65% with normally functioning mechanical mitral valve.    She was admitted in 8/20 with atypical chest pain and mild volume overload (main complaint was chest pain).  Troponin negative.  Coronary CTA was done, showing nonobstructive mild CAD.  Echo showed EF 60-65%, moderate LVH, mildly dilated RV with normal systolic function, s/p TV repair with mild TR, mild AI, mechanical mitral valve with mean gradient 5 mmHg.    Echo was done today and reviewed, EF 55-60%, mild LVH, normal RV size with mildly decreased systolic function, mechanical MV with mean gradient 4 mmHg and no significant MR, s/p tricuspid valve repair and no significant stenosis/mild TR, mild-moderate aortic insufficiency, biatrial enlargement.  She apparently had an echo at [facility] also in 8/21 with concern for a target on the aortic valve, possibly a Lambl's excrescence.  I did not see anything on the valve today.   She returns for followup of CHF. Weight is stable. She is short of breath if she "walks fast."  Does ok walking at a slow and steady pace on flat ground.  Dyspnea with inclines.  No PND, chronically sleeps on 2 pillows.  She has been having episodes of nonexertional chest heaviness.  No trigger except possibly anxiety, episodes will last about 5 minutes.  She has a long history of atypical chest pain but says that this pain is in a different location than usual.  It was recommended at [facility] that she do a stress echo.   Labs (9/17): LDL 87, HDL 61 Labs (12/17): HCT 36.7 Labs (1/18): K 3.7, creatinine 1.09, TSH normal, hgb 11.7 Labs (4/18): K 4.1, creatinine 1.37, hgb 11.9 Labs (11/18): K 4.3, creatinine 1.5, tbili 1.2, ALT/AST normal Labs (12/18): LDL 91, HDL  55, hgb 10.9, K 3.7, creatinine 1.22 Labs (3/19): K 3.9, creatinine 1.35 Labs (8/20): K 4.3, creatinine 1.4, LDL 88 Labs (9/20): K 3.7, creatinine 1.5 Labs (12/20): K 4.1, creatinine 1.39, BNP 127 Labs (3/21): K 4.2, creatinine 1.38, LDL 65, Hgb 10.7 Labs (6/21): K 3.6, creatinine 1.2  PMH: 1. Rheumatic heart disease: Status post mechanical MV replacement and tricuspid repair at [facility] in 2002.  Echo 5/12 with EF 60-65%, mild AI, mechanical MV prosthesis with normal function, severe LAE, moderate RAE, moderate to severe TR, mildly elevated PA systolic pressure.  Echo (6/15) EF 55-60%, normal RV, normal mechanical mitral valve, severe LAE, moderate TR.  Echo (9/16) with EF 55-60%, mild to moderate AI, mechanical mitral valve looks ok, s/p TV repair with mild TR, PASP 40.  - Echo (4/18): EF 60-65%, mechanical mitral valve functioned normally, mild AI, repaired tricuspid valve with mild TR, no TS; PASP 33 mmHg.  - Echo (8/20): EF 60-65%, moderate LVH, mildly dilated RV with normal systolic function, s/p TV repair with mild TR, mild AI, mechanical mitral valve with mean gradient 5 mmHg (normal function).   - Echo (9/21): EF 55-60%, mild LVH, normal RV size with mildly decreased systolic function, mechanical MV with mean gradient 4 mmHg and no significant MR, s/p tricuspid valve repair and no significant stenosis and mild TR, mild-moderate aortic insufficiency, biatrial enlargement. 2. Chronic atrial fibrillation on coumadin.  Event monitor (7/12-8/12) showed no high rate episodes (patient continuously in atrial fibrillation).  3.  Chronic diastolic CHF 4. Chest pain: ETT-myoview (7/12): No evidence for ischemia or infarction.  - Coronary CTA (8/20): Nonobstructive CAD, CAC 63 Agatston units (77th percentile).  5. Impaired fasting glucose 6. Hyperlipidemia  7. HTN 8. Gout 9. Anemia 10. External hemorrhoids.  11. 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  13. CKD 14. Fatty liver 15. GERD  SH: Nonsmoker. Lives in [place].   FH: No premature CAD  ROS: All systems reviewed and negative except as noted in HPI.    Current Outpatient Medications  Medication Sig Dispense Refill  . ACCU-CHEK AVIVA PLUS test strip USE TO CHECK BLOOD SUGAR ONCE DAILY. DX CODE: E11.9 100 strip 12  . ACCU-CHEK SOFTCLIX LANCETS lancets USE AS DIRECTED TO test blood sugar 100 each 6  . acetaminophen (TYLENOL) 500 MG tablet Take 500-1,000 mg by mouth every 6 (six) hours as needed for fever (pain).    Marland Kitchen allopurinol (ZYLOPRIM) 300 MG tablet TAKE 1 TABLET BY MOUTH EVERY DAY 90 tablet 3  . aspirin 81 MG chewable tablet Chew 81 mg by mouth daily.    . cetirizine (ZYRTEC) 10 MG tablet Take 10 mg by mouth daily.    . cyanocobalamin 500 MCG tablet Take 1 tablet (500 mcg total) by mouth daily. 30 tablet 5  . cycloSPORINE (RESTASIS) 0.05 % ophthalmic emulsion Place 1 drop into both eyes daily as needed.     . diazepam (VALIUM) 5 MG tablet Take 5 mg by mouth every 8 (eight) hours as needed for anxiety.     . diclofenac Sodium (VOLTAREN) 1 % GEL APPLY 2 GRAMS TO AFFECTED AREA 4 TIMES A DAY 100 g 3  . famotidine (PEPCID) 20 MG tablet Take 20 mg by mouth daily.    . folic acid (FOLVITE) 1 MG tablet TAKE 1 TABLET BY MOUTH EVERY DAY 90 tablet 3  . isosorbide mononitrate (IMDUR) 30 MG 24 hr tablet Take 30 mg by mouth daily.    . meclizine (ANTIVERT) 25 MG tablet TAKE 1 TABLET (25 MG TOTAL) BY MOUTH 3 (THREE) TIMES DAILY AS NEEDED FOR DIZZINESS. 30 tablet 0  . Menthol-Methyl Salicylate (TIGER BALM LINIMENT EX) Apply 1 application topically daily.    . metFORMIN (GLUCOPHAGE) 500 MG tablet TAKE 1 TABLET (500 MG TOTAL) BY MOUTH 2 (TWO) TIMES DAILY WITH A MEAL. 180 tablet 1  . metoprolol succinate (TOPROL-XL) 50 MG 24 hr tablet TAKE 1 TABLET BY MOUTH EVERY DAY 90 tablet 3  . nitroGLYCERIN (NITROSTAT) 0.4 MG SL tablet Place 0.4 mg under the tongue every 5 (five)  minutes as needed for chest pain.    Marland Kitchen Olopatadine HCl (PAZEO) 0.7 % SOLN Place 1 drop into both eyes daily as needed (itching/ irritation).    Marland Kitchen omeprazole (PRILOSEC) 20 MG capsule TAKE 1 CAPSULE 2 (TWO) TIMES DAILY BEFORE A MEAL. TAKE WITH OMEPRAZOLE 40 MG TO EQUAL 60 MG 180 capsule 3  . potassium chloride SA (KLOR-CON M20) 20 MEQ tablet Take 3 tablets (60 mEq total) by mouth 2 (two) times daily. 540 tablet 2  . rosuvastatin (CRESTOR) 10 MG tablet TAKE 1 TABLET BY MOUTH EVERY DAY 90 tablet 3  . torsemide (DEMADEX) 20 MG tablet Take 4 tablets (80 mg total) by mouth 2 (two) times daily. 720 tablet 1  . warfarin (COUMADIN) 2 MG tablet Take 1/2 a tablet to 1 tablet by mouth daily as directed by the coumadin clinic. 30 tablet 3  . zinc sulfate  220 (50 Zn) MG capsule Take 220 mg by mouth daily.     No current facility-administered medications for this encounter.    BP 114/64   Pulse 80   Wt 65.9 kg (145 lb 6 oz)   SpO2 98%   BMI 28.39 kg/m  General: NAD Neck: JVP 8-9 cm, no thyromegaly or thyroid nodule.  Lungs: Clear to auscultation bilaterally with normal respiratory effort. CV: Nondisplaced PMI.  Heart irregular S1/S2 with mechanical S1, no S3/S4, 2/6 SEM RUSB.  Trace ankle edema.  No carotid bruit.  Normal pedal pulses.  Abdomen: Soft, nontender, no hepatosplenomegaly, no distention.  Skin: Intact without lesions or rashes.  Neurologic: Alert and oriented x 3.  Psych: Normal affect. Extremities: No clubbing or cyanosis.  HEENT: Normal.   Assessment/Plan: 1. Atrial fibrillation: Chronic.  Continue warfarin.  Continue Toprol XL 50 mg daily.     2. Mechanical mitral valve: Continue ASA 81 daily and coumadin with INR goal 2.5-3.5.  Will need bridging with heparin/Lovenox if has to come off coumadin.  Needs endocarditis prophylaxis with any dental work. Mechanical MV looked ok on echo today.  - CBC today.  3. Tricuspid valve repair: Stable by echo in 9/21, TR appears mild with no  significant stenosis.  4. Chronic diastolic CHF:  Echo today with normal EF, mildly decreased RV systolic function.  NYHA class II-III symptoms.  She is mildly volume overloaded on exam today.      - Increase torsemide to 80 mg bid with BMET today and in 10 days.  - Increase KCl to 60 bid.  5. CAD: coronary CTA in 8/20 showed nonobstructive CAD, suspect chest pain was noncardiac.  She still has episodes of atypical chest pain, says that the location has changed compared to the past (more central versus on the left). Symptoms could be related to volume overload.    - Continue Crestor, good lipids in 3/21. - She is on ASA 81 mg daily.  - I would avoid stress echo, not sure it would be useful.  I will increase her diuretic to see if this helps symptoms.  If chest pain does not improve, could repeat coronary CTA.  6. Aortic insufficiency: Mild to moderate on echo today.  Apparently, there was concern for valvular vegetation on an echo in 8/21 at [facility].  Maybe this was a Lambl's excrescence, the valve does not have vegetation visible today.  No fever/chills.    Followup with NP in 3-4 weeks to see if atypical chest pain has improved with diuresis.  She is getting redundant care here and at [facility] (had 2 echoes in 2 months).  Asked them to be careful about this as may become an issue with her insurance.   Marca Ancona 05/16/2020

## 2020-05-18 ENCOUNTER — Other Ambulatory Visit: Payer: Self-pay | Admitting: Student in an Organized Health Care Education/Training Program

## 2020-05-27 ENCOUNTER — Other Ambulatory Visit (HOSPITAL_COMMUNITY): Payer: Medicare Other

## 2020-06-01 ENCOUNTER — Ambulatory Visit (HOSPITAL_COMMUNITY)
Admission: RE | Admit: 2020-06-01 | Discharge: 2020-06-01 | Disposition: A | Discharge status: Home / Self Care | Payer: Medicare Other | Source: Ambulatory Visit | Attending: Internal Medicine | Admitting: Internal Medicine

## 2020-06-01 ENCOUNTER — Other Ambulatory Visit: Payer: Self-pay

## 2020-06-01 DIAGNOSIS — I5032 Chronic diastolic (congestive) heart failure: Secondary | ICD-10-CM | POA: Insufficient documentation

## 2020-06-01 LAB — BASIC METABOLIC PANEL
Anion gap: 14 (ref 5–15)
BUN: 21 mg/dL (ref 8–23)
CO2: 22 mmol/L (ref 22–32)
Calcium: 9.2 mg/dL (ref 8.9–10.3)
Chloride: 99 mmol/L (ref 98–111)
Creatinine, Ser: 1.47 mg/dL — ABNORMAL HIGH (ref 0.44–1.00)
GFR calc non Af Amer: 37 mL/min — ABNORMAL LOW (ref >60)
Glucose, Bld: 273 mg/dL — ABNORMAL HIGH (ref 70–99)
Potassium: 3.9 mmol/L (ref 3.5–5.1)
Sodium: 135 mmol/L (ref 135–145)

## 2020-06-11 ENCOUNTER — Other Ambulatory Visit: Payer: Self-pay | Admitting: Cardiology

## 2020-06-15 ENCOUNTER — Ambulatory Visit (INDEPENDENT_AMBULATORY_CARE_PROVIDER_SITE_OTHER): Payer: Medicare Other | Admitting: Pharmacist

## 2020-06-15 ENCOUNTER — Other Ambulatory Visit: Payer: Self-pay

## 2020-06-15 DIAGNOSIS — Z5181 Encounter for therapeutic drug level monitoring: Secondary | ICD-10-CM

## 2020-06-15 DIAGNOSIS — Z7901 Long term (current) use of anticoagulants: Secondary | ICD-10-CM

## 2020-06-15 DIAGNOSIS — I059 Rheumatic mitral valve disease, unspecified: Secondary | ICD-10-CM | POA: Diagnosis not present

## 2020-06-15 LAB — POCT INR: INR: 4.4 — AB (ref 2.0–3.0)

## 2020-06-15 NOTE — Patient Instructions (Addendum)
Description   Hold dose today and tomorrow then continue current regimen of 2 mg Tue, Thur, Sat and 1 mg all other days.  Recheck in 2 weeks. Call us with any questions [phone]

## 2020-06-21 ENCOUNTER — Encounter (HOSPITAL_COMMUNITY): Payer: Medicare Other

## 2020-06-22 ENCOUNTER — Encounter (HOSPITAL_COMMUNITY): Payer: Medicare Other

## 2020-07-01 ENCOUNTER — Ambulatory Visit (INDEPENDENT_AMBULATORY_CARE_PROVIDER_SITE_OTHER): Payer: Medicare Other | Admitting: *Deleted

## 2020-07-01 ENCOUNTER — Other Ambulatory Visit: Payer: Self-pay

## 2020-07-01 DIAGNOSIS — Z5181 Encounter for therapeutic drug level monitoring: Secondary | ICD-10-CM | POA: Diagnosis not present

## 2020-07-01 DIAGNOSIS — Z7901 Long term (current) use of anticoagulants: Secondary | ICD-10-CM | POA: Diagnosis not present

## 2020-07-01 DIAGNOSIS — I059 Rheumatic mitral valve disease, unspecified: Secondary | ICD-10-CM | POA: Diagnosis not present

## 2020-07-01 LAB — POCT INR: INR: 3.4 — AB (ref 2.0–3.0)

## 2020-07-01 NOTE — Patient Instructions (Signed)
Description   Continue current regimen of 2 mg Tue, Thur, Sat and 1 mg all other days.  Recheck in 3 weeks. Call us with any questions [phone]

## 2020-07-10 NOTE — Progress Notes (Signed)
Patient ID: Deborah [name], female   DOB: [birth year], 69 y.o.   MRN: [id number] PCP: Dr. Beryle Flock Cardiology: Dr. [name]  69 y.o. with rheumatic heart disease and MV replacement/TV repair, chronic atrial fibrillation, and chronic diastolic CHF.  Echo in 4/18 showed EF 60-65% with normally functioning mechanical mitral valve.    She was admitted in 8/20 with atypical chest pain and mild volume overload (main complaint was chest pain).  Troponin negative.  Coronary CTA was done, showing nonobstructive mild CAD.  Echo showed EF 60-65%, moderate LVH, mildly dilated RV with normal systolic function, s/p TV repair with mild TR, mild AI, mechanical mitral valve with mean gradient 5 mmHg.    Echo was nd reviewed, EF 55-60%, mild LVH, normal RV size with mildly decreased systolic function, mechanical MV with mean gradient 4 mmHg and no significant MR, s/p tricuspid valve repair and no significant stenosis/mild TR, mild-moderate aortic insufficiency, biatrial enlargement.  She apparently had an echo at [facility] also in 8/21 with concern for a target on the aortic valve, possibly a Lambl's excrescence.  I did not see anything on the valve today.   Today she returns for HF follow up with her daughter. Last visit torsemide was increased to 80 mg twice a day but she cut the evening dose back to 60 mg. Overall feeling better. Occasionally she has chest tightness when she is stressed. Says she takes valium and this makes her feel better.  Denies SOB/PND/Orthopnea. Walking 40 minutes 3-4 days a week. Appetite ok. No fever or chills. Weight at home has been stable. Taking all medications.  Labs (9/17): LDL 87, HDL 61 Labs (12/17): HCT 36.7 Labs (1/18): K 3.7, creatinine 1.09, TSH normal, hgb 11.7 Labs (4/18): K 4.1, creatinine 1.37, hgb 11.9 Labs (11/18): K 4.3, creatinine 1.5, tbili 1.2, ALT/AST normal Labs (12/18): LDL 91, HDL 55, hgb 10.9, K 3.7, creatinine 1.22 Labs (3/19): K 3.9, creatinine 1.35 Labs (8/20): K  4.3, creatinine 1.4, LDL 88 Labs (9/20): K 3.7, creatinine 1.5 Labs (12/20): K 4.1, creatinine 1.39, BNP 127 Labs (3/21): K 4.2, creatinine 1.38, LDL 65, Hgb 10.7 Labs (6/21): K 3.6, creatinine 1.2 Lab (06/01/2020): K 3.9 Creatinine 1.47   PMH: 1. Rheumatic heart disease: Status post mechanical MV replacement and tricuspid repair at [facility] in 2002.  Echo 5/12 with EF 60-65%, mild AI, mechanical MV prosthesis with normal function, severe LAE, moderate RAE, moderate to severe TR, mildly elevated PA systolic pressure.  Echo (6/15) EF 55-60%, normal RV, normal mechanical mitral valve, severe LAE, moderate TR.  Echo (9/16) with EF 55-60%, mild to moderate AI, mechanical mitral valve looks ok, s/p TV repair with mild TR, PASP 40.  - Echo (4/18): EF 60-65%, mechanical mitral valve functioned normally, mild AI, repaired tricuspid valve with mild TR, no TS; PASP 33 mmHg.  - Echo (8/20): EF 60-65%, moderate LVH, mildly dilated RV with normal systolic function, s/p TV repair with mild TR, mild AI, mechanical mitral valve with mean gradient 5 mmHg (normal function).   - Echo (9/21): EF 55-60%, mild LVH, normal RV size with mildly decreased systolic function, mechanical MV with mean gradient 4 mmHg and no significant MR, s/p tricuspid valve repair and no significant stenosis and mild TR, mild-moderate aortic insufficiency, biatrial enlargement. 2. Chronic atrial fibrillation on coumadin.  Event monitor (7/12-8/12) showed no high rate episodes (patient continuously in atrial fibrillation).  3. Chronic diastolic CHF 4. Chest pain: ETT-myoview (7/12): No evidence for ischemia or infarction.  - Coronary  CTA (8/20): Nonobstructive CAD, CAC 63 Agatston units (77th percentile).  5. Impaired fasting glucose 6. Hyperlipidemia  7. HTN 8. Gout 9. Anemia 10. External hemorrhoids.  11. 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  13. CKD 14. Fatty liver 15.  GERD  SH: Nonsmoker. Lives in [place].   FH: No premature CAD  ROS: All systems reviewed and negative except as noted in HPI.    Current Outpatient Medications  Medication Sig Dispense Refill  . ACCU-CHEK AVIVA PLUS test strip USE TO CHECK BLOOD SUGAR ONCE DAILY. DX CODE: E11.9 100 strip 12  . ACCU-CHEK SOFTCLIX LANCETS lancets USE AS DIRECTED TO test blood sugar 100 each 6  . acetaminophen (TYLENOL) 500 MG tablet Take 500-1,000 mg by mouth every 6 (six) hours as needed for fever (pain).    Marland Kitchen allopurinol (ZYLOPRIM) 300 MG tablet TAKE 1 TABLET BY MOUTH EVERY DAY 90 tablet 3  . aspirin 81 MG chewable tablet Chew 81 mg by mouth daily.    . cetirizine (ZYRTEC) 10 MG tablet Take 10 mg by mouth daily.    . cyanocobalamin 500 MCG tablet Take 1 tablet (500 mcg total) by mouth daily. 30 tablet 5  . cycloSPORINE (RESTASIS) 0.05 % ophthalmic emulsion Place 1 drop into both eyes daily as needed.     . diazepam (VALIUM) 5 MG tablet Take 5 mg by mouth every 8 (eight) hours as needed for anxiety.     . diclofenac Sodium (VOLTAREN) 1 % GEL APPLY 2 GRAMS TO AFFECTED AREA 4 TIMES A DAY 100 g 3  . famotidine (PEPCID) 20 MG tablet Take 20 mg by mouth daily.    . folic acid (FOLVITE) 1 MG tablet TAKE 1 TABLET BY MOUTH EVERY DAY 90 tablet 3  . isosorbide mononitrate (IMDUR) 30 MG 24 hr tablet Take 30 mg by mouth daily.    . meclizine (ANTIVERT) 25 MG tablet TAKE 1 TABLET (25 MG TOTAL) BY MOUTH 3 (THREE) TIMES DAILY AS NEEDED FOR DIZZINESS. 30 tablet 0  . Menthol-Methyl Salicylate (TIGER BALM LINIMENT EX) Apply 1 application topically daily.    . metFORMIN (GLUCOPHAGE) 500 MG tablet TAKE 1 TABLET (500 MG TOTAL) BY MOUTH 2 (TWO) TIMES DAILY WITH A MEAL. 180 tablet 1  . metoprolol succinate (TOPROL-XL) 50 MG 24 hr tablet TAKE 1 TABLET BY MOUTH EVERY DAY 90 tablet 3  . nitroGLYCERIN (NITROSTAT) 0.4 MG SL tablet Place 0.4 mg under the tongue every 5 (five) minutes as needed for chest pain.    Marland Kitchen Olopatadine HCl  (PAZEO) 0.7 % SOLN Place 1 drop into both eyes daily as needed (itching/ irritation).    Marland Kitchen omeprazole (PRILOSEC) 20 MG capsule TAKE 1 CAPSULE 2 (TWO) TIMES DAILY BEFORE A MEAL. TAKE WITH OMEPRAZOLE 40 MG TO EQUAL 60 MG 180 capsule 3  . potassium chloride SA (KLOR-CON M20) 20 MEQ tablet Take 3 tablets (60 mEq total) by mouth 2 (two) times daily. 540 tablet 2  . rosuvastatin (CRESTOR) 10 MG tablet TAKE 1 TABLET BY MOUTH EVERY DAY 90 tablet 3  . torsemide (DEMADEX) 20 MG tablet Take 20 mg by mouth. 80 mg in the AM and 60 mg in the PM    . warfarin (COUMADIN) 2 MG tablet Take 1/2 a tablet to 1 tablet by mouth daily as directed by the coumadin clinic. 30 tablet 3  . zinc sulfate 220 (50 Zn) MG capsule Take 220 mg by mouth daily.  No current facility-administered medications for this encounter.    BP 124/70   Pulse 83   Wt 67.8 kg (149 lb 6.4 oz)   SpO2 95%   BMI 29.18 kg/m   Wt Readings from Last 3 Encounters:  07/11/20 67.8 kg (149 lb 6.4 oz)  05/16/20 65.9 kg (145 lb 6 oz)  02/02/20 66.5 kg (146 lb 9.6 oz)    ReDS Vest / Clip - 07/11/20 1100      ReDS Vest / Clip   Station Marker A    Ruler Value 31    ReDS Value Range Low volume    ReDS Actual Value 28    Anatomical Comments sitting            General:  Well appearing. No resp difficulty HEENT: normal Neck: supple. no JVD. Carotids 2+ bilat; no bruits. No lymphadenopathy or thryomegaly appreciated. Cor: PMI nondisplaced. Irregular rate & rhythm. No rubs, gallops or murmurs. Lungs: clear Abdomen: soft, nontender, nondistended. No hepatosplenomegaly. No bruits or masses. Good bowel sounds. Extremities: no cyanosis, clubbing, rash, edema Neuro: alert & orientedx3, cranial nerves grossly intact. moves all 4 extremities w/o difficulty. Affect pleasant   Assessment/Plan: 1. Atrial fibrillation: Chronic. Rate controlled.  Continue warfarin.  Continue Toprol XL 50 mg daily.     2. Mechanical mitral valve: Continue ASA 81 daily  and coumadin with INR goal 2.5-3.5.  Will need bridging with heparin/Lovenox if has to come off coumadin.  Needs endocarditis prophylaxis with any dental work. Mechanical MV looked ok on echo today.   3. Tricuspid valve repair: Stable by echo in 9/21, TR appears mild with no significant stenosis.  4. Chronic diastolic CHF:  Echo 04/2020 with normal EF, mildly decreased RV systolic function.   NYHA II. Reds Clip 28%.  - Volume status stable. Continue torsemide 80 mg in am and 60 mg in pm.    Continue  KCl to 60 bid.  5. CAD: coronary CTA in 8/20 showed nonobstructive CAD, suspect chest pain was noncardiac.  She still has episodes of atypical chest pain, says that the location has changed compared to the past (more central versus on the left). Symptoms could be related to volume overload.    - Continue Crestor, good lipids in 3/21. - She is on ASA 81 mg daily.  - Per Dr [name] would avoid stress echo, not sure it would be useful.   - No chest pain today or recently.  6. Aortic insufficiency: Mild to moderate on echo.  Apparently, there was concern for valvular vegetation on an echo in 8/21 at [facility].  Maybe this was a Lambl's excrescence, the valve does not have vegetation visible today.     Follow up with Dr [name] in 3  months. Archer Moist 07/11/2020

## 2020-07-11 ENCOUNTER — Encounter (HOSPITAL_COMMUNITY): Payer: Self-pay

## 2020-07-11 ENCOUNTER — Ambulatory Visit (HOSPITAL_COMMUNITY)
Admission: RE | Admit: 2020-07-11 | Discharge: 2020-07-11 | Disposition: A | Discharge status: Home / Self Care | Payer: Medicare Other | Source: Ambulatory Visit | Attending: Adult Health | Admitting: Adult Health

## 2020-07-11 ENCOUNTER — Other Ambulatory Visit: Payer: Self-pay

## 2020-07-11 VITALS — BP 124/70 | HR 83 | Wt 149.4 lb

## 2020-07-11 DIAGNOSIS — Z7901 Long term (current) use of anticoagulants: Secondary | ICD-10-CM | POA: Insufficient documentation

## 2020-07-11 DIAGNOSIS — I5032 Chronic diastolic (congestive) heart failure: Secondary | ICD-10-CM | POA: Diagnosis not present

## 2020-07-11 DIAGNOSIS — I2510 Atherosclerotic heart disease of native coronary artery without angina pectoris: Secondary | ICD-10-CM | POA: Diagnosis not present

## 2020-07-11 DIAGNOSIS — E785 Hyperlipidemia, unspecified: Secondary | ICD-10-CM | POA: Diagnosis not present

## 2020-07-11 DIAGNOSIS — N189 Chronic kidney disease, unspecified: Secondary | ICD-10-CM | POA: Insufficient documentation

## 2020-07-11 DIAGNOSIS — Z791 Long term (current) use of non-steroidal anti-inflammatories (NSAID): Secondary | ICD-10-CM | POA: Diagnosis not present

## 2020-07-11 DIAGNOSIS — Z79899 Other long term (current) drug therapy: Secondary | ICD-10-CM | POA: Insufficient documentation

## 2020-07-11 DIAGNOSIS — Z7982 Long term (current) use of aspirin: Secondary | ICD-10-CM | POA: Diagnosis not present

## 2020-07-11 DIAGNOSIS — I130 Hypertensive heart and chronic kidney disease with heart failure and stage 1 through stage 4 chronic kidney disease, or unspecified chronic kidney disease: Secondary | ICD-10-CM | POA: Insufficient documentation

## 2020-07-11 DIAGNOSIS — Z7984 Long term (current) use of oral hypoglycemic drugs: Secondary | ICD-10-CM | POA: Diagnosis not present

## 2020-07-11 DIAGNOSIS — I082 Rheumatic disorders of both aortic and tricuspid valves: Secondary | ICD-10-CM | POA: Diagnosis not present

## 2020-07-11 DIAGNOSIS — I482 Chronic atrial fibrillation, unspecified: Secondary | ICD-10-CM | POA: Insufficient documentation

## 2020-07-11 DIAGNOSIS — I200 Unstable angina: Secondary | ICD-10-CM

## 2020-07-11 DIAGNOSIS — Z952 Presence of prosthetic heart valve: Secondary | ICD-10-CM | POA: Diagnosis not present

## 2020-07-11 NOTE — Patient Instructions (Signed)
Your physician recommends that you schedule a follow-up appointment in: 3 months with Dr.McLean  If you have any questions or concerns before your next appointment please send [place] a message through [place] or call our office at [phone].    TO LEAVE A MESSAGE FOR THE NURSE SELECT OPTION 2, PLEASE LEAVE A MESSAGE INCLUDING: . YOUR NAME . DATE OF BIRTH . CALL BACK NUMBER . REASON FOR CALL**this is important as we prioritize the call backs  YOU WILL RECEIVE A CALL BACK THE SAME DAY AS LONG AS YOU CALL BEFORE 4:00 PM  At the [facility], you and your health needs are our priority. As part of our continuing mission to provide you with exceptional heart care, we have created designated Provider Care Teams. These Care Teams include your primary Cardiologist (physician) and Advanced Practice Providers (APPs- Physician Assistants and Nurse Practitioners) who all work together to provide you with the care you need, when you need it.   You may see any of the following providers on your designated Care Team at your next follow up: Marland Kitchen Dr Arvilla Meres . Dr Marca Ancona . Tonye Becket, NP . Robbie Lis, PA . Karle Plumber, PharmD   Please be sure to bring in all your medications bottles to every appointment.

## 2020-07-11 NOTE — Progress Notes (Signed)
ReDS Vest / Clip - 07/11/20 1100      ReDS Vest / Clip   Station Marker A    Ruler Value 31    ReDS Value Range Low volume    ReDS Actual Value 28    Anatomical Comments sitting

## 2020-07-19 ENCOUNTER — Other Ambulatory Visit: Payer: Self-pay | Admitting: Cardiology

## 2020-07-25 ENCOUNTER — Ambulatory Visit (INDEPENDENT_AMBULATORY_CARE_PROVIDER_SITE_OTHER): Payer: Medicare Other

## 2020-07-25 ENCOUNTER — Other Ambulatory Visit: Payer: Self-pay

## 2020-07-25 DIAGNOSIS — Z7901 Long term (current) use of anticoagulants: Secondary | ICD-10-CM | POA: Diagnosis not present

## 2020-07-25 DIAGNOSIS — Z5181 Encounter for therapeutic drug level monitoring: Secondary | ICD-10-CM | POA: Diagnosis not present

## 2020-07-25 DIAGNOSIS — I059 Rheumatic mitral valve disease, unspecified: Secondary | ICD-10-CM

## 2020-07-25 LAB — POCT INR: INR: 2.9 (ref 2.0–3.0)

## 2020-07-25 NOTE — Patient Instructions (Signed)
Description   Continue on same dosage 1mg  daily except 2mg  on Tuesdays, Thursdays and Saturdays.  Recheck in 4 weeks. Call [phone] with any questions [phone]

## 2020-07-27 ENCOUNTER — Other Ambulatory Visit (HOSPITAL_COMMUNITY): Payer: Self-pay | Admitting: Cardiology

## 2020-07-28 ENCOUNTER — Other Ambulatory Visit: Payer: Self-pay

## 2020-07-29 MED ORDER — ACCU-CHEK AVIVA PLUS VI STRP
ORAL_STRIP | 12 refills | Status: DC
Start: 2020-07-29 — End: 2020-08-15

## 2020-08-15 ENCOUNTER — Other Ambulatory Visit: Payer: Self-pay | Admitting: Student in an Organized Health Care Education/Training Program

## 2020-08-15 DIAGNOSIS — E119 Type 2 diabetes mellitus without complications: Secondary | ICD-10-CM

## 2020-08-15 MED ORDER — ACCU-CHEK AVIVA PLUS VI STRP: ORAL_STRIP | 12 refills | Status: DC

## 2020-08-15 NOTE — Progress Notes (Signed)
Refilled test strips to pharmacy request and PRN instructions

## 2020-08-22 ENCOUNTER — Other Ambulatory Visit: Payer: Self-pay

## 2020-08-22 ENCOUNTER — Ambulatory Visit (INDEPENDENT_AMBULATORY_CARE_PROVIDER_SITE_OTHER): Payer: Medicare Other | Admitting: *Deleted

## 2020-08-22 DIAGNOSIS — I059 Rheumatic mitral valve disease, unspecified: Secondary | ICD-10-CM

## 2020-08-22 DIAGNOSIS — Z5181 Encounter for therapeutic drug level monitoring: Secondary | ICD-10-CM | POA: Diagnosis not present

## 2020-08-22 DIAGNOSIS — Z7901 Long term (current) use of anticoagulants: Secondary | ICD-10-CM

## 2020-08-22 LAB — POCT INR: INR: 4.3 — AB (ref 2.0–3.0)

## 2020-08-22 NOTE — Patient Instructions (Signed)
Description   Hold today, then continue on same dosage 1mg  daily except 2mg  on Tuesdays, Thursdays and Saturdays.  Recheck in 2 weeks. Call [phone] with any questions [phone]

## 2020-08-24 ENCOUNTER — Emergency Department (HOSPITAL_COMMUNITY): Payer: Medicare Other

## 2020-08-24 ENCOUNTER — Inpatient Hospital Stay (HOSPITAL_COMMUNITY): Payer: Medicare Other

## 2020-08-24 ENCOUNTER — Other Ambulatory Visit: Payer: Self-pay

## 2020-08-24 ENCOUNTER — Inpatient Hospital Stay (HOSPITAL_COMMUNITY)
Admission: EM | Admit: 2020-08-24 | Discharge: 2020-09-02 | DRG: 064 | Disposition: A | Discharge status: Rehabilitation Facility | Payer: Medicare Other | Attending: Neurology | Admitting: Neurology

## 2020-08-24 DIAGNOSIS — I161 Hypertensive emergency: Secondary | ICD-10-CM | POA: Diagnosis present

## 2020-08-24 DIAGNOSIS — I482 Chronic atrial fibrillation, unspecified: Secondary | ICD-10-CM | POA: Diagnosis not present

## 2020-08-24 DIAGNOSIS — I5032 Chronic diastolic (congestive) heart failure: Secondary | ICD-10-CM | POA: Diagnosis not present

## 2020-08-24 DIAGNOSIS — I10 Essential (primary) hypertension: Secondary | ICD-10-CM | POA: Diagnosis not present

## 2020-08-24 DIAGNOSIS — R531 Weakness: Secondary | ICD-10-CM | POA: Diagnosis present

## 2020-08-24 DIAGNOSIS — I619 Nontraumatic intracerebral hemorrhage, unspecified: Secondary | ICD-10-CM | POA: Diagnosis not present

## 2020-08-24 DIAGNOSIS — R059 Cough, unspecified: Secondary | ICD-10-CM

## 2020-08-24 DIAGNOSIS — Z7901 Long term (current) use of anticoagulants: Secondary | ICD-10-CM

## 2020-08-24 DIAGNOSIS — Z7982 Long term (current) use of aspirin: Secondary | ICD-10-CM

## 2020-08-24 DIAGNOSIS — I110 Hypertensive heart disease with heart failure: Secondary | ICD-10-CM | POA: Diagnosis not present

## 2020-08-24 DIAGNOSIS — I071 Rheumatic tricuspid insufficiency: Secondary | ICD-10-CM | POA: Diagnosis present

## 2020-08-24 DIAGNOSIS — E8770 Fluid overload, unspecified: Secondary | ICD-10-CM

## 2020-08-24 DIAGNOSIS — E1165 Type 2 diabetes mellitus with hyperglycemia: Secondary | ICD-10-CM

## 2020-08-24 DIAGNOSIS — I130 Hypertensive heart and chronic kidney disease with heart failure and stage 1 through stage 4 chronic kidney disease, or unspecified chronic kidney disease: Secondary | ICD-10-CM | POA: Diagnosis not present

## 2020-08-24 DIAGNOSIS — T45515A Adverse effect of anticoagulants, initial encounter: Secondary | ICD-10-CM | POA: Diagnosis present

## 2020-08-24 DIAGNOSIS — Z79899 Other long term (current) drug therapy: Secondary | ICD-10-CM | POA: Diagnosis not present

## 2020-08-24 DIAGNOSIS — D509 Iron deficiency anemia, unspecified: Secondary | ICD-10-CM | POA: Diagnosis present

## 2020-08-24 DIAGNOSIS — I63549 Cerebral infarction due to unspecified occlusion or stenosis of unspecified cerebellar artery: Secondary | ICD-10-CM | POA: Diagnosis present

## 2020-08-24 DIAGNOSIS — Z823 Family history of stroke: Secondary | ICD-10-CM | POA: Diagnosis not present

## 2020-08-24 DIAGNOSIS — E7800 Pure hypercholesterolemia, unspecified: Secondary | ICD-10-CM | POA: Diagnosis not present

## 2020-08-24 DIAGNOSIS — I69193 Ataxia following nontraumatic intracerebral hemorrhage: Secondary | ICD-10-CM | POA: Diagnosis not present

## 2020-08-24 DIAGNOSIS — Z6830 Body mass index (BMI) 30.0-30.9, adult: Secondary | ICD-10-CM | POA: Diagnosis not present

## 2020-08-24 DIAGNOSIS — I469 Cardiac arrest, cause unspecified: Secondary | ICD-10-CM | POA: Diagnosis not present

## 2020-08-24 DIAGNOSIS — Z20822 Contact with and (suspected) exposure to covid-19: Secondary | ICD-10-CM | POA: Diagnosis present

## 2020-08-24 DIAGNOSIS — D6832 Hemorrhagic disorder due to extrinsic circulating anticoagulants: Secondary | ICD-10-CM | POA: Diagnosis present

## 2020-08-24 DIAGNOSIS — E785 Hyperlipidemia, unspecified: Secondary | ICD-10-CM | POA: Diagnosis not present

## 2020-08-24 DIAGNOSIS — I2510 Atherosclerotic heart disease of native coronary artery without angina pectoris: Secondary | ICD-10-CM | POA: Diagnosis present

## 2020-08-24 DIAGNOSIS — Z833 Family history of diabetes mellitus: Secondary | ICD-10-CM | POA: Diagnosis not present

## 2020-08-24 DIAGNOSIS — G936 Cerebral edema: Secondary | ICD-10-CM | POA: Diagnosis present

## 2020-08-24 DIAGNOSIS — Z8249 Family history of ischemic heart disease and other diseases of the circulatory system: Secondary | ICD-10-CM

## 2020-08-24 DIAGNOSIS — I674 Hypertensive encephalopathy: Secondary | ICD-10-CM | POA: Diagnosis present

## 2020-08-24 DIAGNOSIS — I5033 Acute on chronic diastolic (congestive) heart failure: Secondary | ICD-10-CM | POA: Diagnosis not present

## 2020-08-24 DIAGNOSIS — I4891 Unspecified atrial fibrillation: Secondary | ICD-10-CM | POA: Diagnosis not present

## 2020-08-24 DIAGNOSIS — I629 Nontraumatic intracranial hemorrhage, unspecified: Secondary | ICD-10-CM | POA: Diagnosis not present

## 2020-08-24 DIAGNOSIS — J690 Pneumonitis due to inhalation of food and vomit: Secondary | ICD-10-CM | POA: Diagnosis present

## 2020-08-24 DIAGNOSIS — I4819 Other persistent atrial fibrillation: Secondary | ICD-10-CM | POA: Diagnosis not present

## 2020-08-24 DIAGNOSIS — I639 Cerebral infarction, unspecified: Secondary | ICD-10-CM | POA: Diagnosis not present

## 2020-08-24 DIAGNOSIS — N179 Acute kidney failure, unspecified: Secondary | ICD-10-CM | POA: Diagnosis not present

## 2020-08-24 DIAGNOSIS — T17908A Unspecified foreign body in respiratory tract, part unspecified causing other injury, initial encounter: Secondary | ICD-10-CM

## 2020-08-24 DIAGNOSIS — I614 Nontraumatic intracerebral hemorrhage in cerebellum: Principal | ICD-10-CM | POA: Diagnosis present

## 2020-08-24 DIAGNOSIS — D696 Thrombocytopenia, unspecified: Secondary | ICD-10-CM | POA: Diagnosis not present

## 2020-08-24 DIAGNOSIS — Z7984 Long term (current) use of oral hypoglycemic drugs: Secondary | ICD-10-CM

## 2020-08-24 DIAGNOSIS — H5500 Unspecified nystagmus: Secondary | ICD-10-CM | POA: Diagnosis present

## 2020-08-24 DIAGNOSIS — R791 Abnormal coagulation profile: Secondary | ICD-10-CM | POA: Diagnosis not present

## 2020-08-24 DIAGNOSIS — E1169 Type 2 diabetes mellitus with other specified complication: Secondary | ICD-10-CM | POA: Diagnosis not present

## 2020-08-24 DIAGNOSIS — D721 Eosinophilia, unspecified: Secondary | ICD-10-CM | POA: Diagnosis present

## 2020-08-24 DIAGNOSIS — R270 Ataxia, unspecified: Secondary | ICD-10-CM | POA: Diagnosis present

## 2020-08-24 DIAGNOSIS — I358 Other nonrheumatic aortic valve disorders: Secondary | ICD-10-CM | POA: Diagnosis present

## 2020-08-24 DIAGNOSIS — E871 Hypo-osmolality and hyponatremia: Secondary | ICD-10-CM | POA: Diagnosis not present

## 2020-08-24 DIAGNOSIS — G479 Sleep disorder, unspecified: Secondary | ICD-10-CM | POA: Diagnosis not present

## 2020-08-24 DIAGNOSIS — I2129 ST elevation (STEMI) myocardial infarction involving other sites: Secondary | ICD-10-CM | POA: Diagnosis not present

## 2020-08-24 DIAGNOSIS — K219 Gastro-esophageal reflux disease without esophagitis: Secondary | ICD-10-CM | POA: Diagnosis not present

## 2020-08-24 DIAGNOSIS — E669 Obesity, unspecified: Secondary | ICD-10-CM | POA: Diagnosis not present

## 2020-08-24 DIAGNOSIS — R0789 Other chest pain: Secondary | ICD-10-CM | POA: Diagnosis not present

## 2020-08-24 DIAGNOSIS — Z952 Presence of prosthetic heart valve: Secondary | ICD-10-CM

## 2020-08-24 DIAGNOSIS — Z888 Allergy status to other drugs, medicaments and biological substances status: Secondary | ICD-10-CM

## 2020-08-24 DIAGNOSIS — D560 Alpha thalassemia: Secondary | ICD-10-CM | POA: Diagnosis present

## 2020-08-24 DIAGNOSIS — I6389 Other cerebral infarction: Secondary | ICD-10-CM | POA: Diagnosis not present

## 2020-08-24 LAB — CBC WITH DIFFERENTIAL/PLATELET
Abs Immature Granulocytes: 0.04 10*3/uL (ref 0.00–0.07)
Basophils Absolute: 0.1 10*3/uL (ref 0.0–0.1)
Basophils Relative: 1 %
Eosinophils Absolute: 0.7 10*3/uL — ABNORMAL HIGH (ref 0.0–0.5)
Eosinophils Relative: 7 %
HCT: 36.3 % (ref 36.0–46.0)
Hemoglobin: 11 g/dL — ABNORMAL LOW (ref 12.0–15.0)
Immature Granulocytes: 0 %
Lymphocytes Relative: 38 %
Lymphs Abs: 3.9 10*3/uL (ref 0.7–4.0)
MCH: 23.3 pg — ABNORMAL LOW (ref 26.0–34.0)
MCHC: 30.3 g/dL (ref 30.0–36.0)
MCV: 76.7 fL — ABNORMAL LOW (ref 80.0–100.0)
Monocytes Absolute: 1 10*3/uL (ref 0.1–1.0)
Monocytes Relative: 10 %
Neutro Abs: 4.4 10*3/uL (ref 1.7–7.7)
Neutrophils Relative %: 44 %
Platelets: 201 10*3/uL (ref 150–400)
RBC: 4.73 MIL/uL (ref 3.87–5.11)
RDW: 17.7 % — ABNORMAL HIGH (ref 11.5–15.5)
WBC: 10.1 10*3/uL (ref 4.0–10.5)
nRBC: 0.3 % — ABNORMAL HIGH (ref 0.0–0.2)

## 2020-08-24 LAB — COMPREHENSIVE METABOLIC PANEL
ALT: 22 U/L (ref 0–44)
AST: 37 U/L (ref 15–41)
Albumin: 3.6 g/dL (ref 3.5–5.0)
Alkaline Phosphatase: 69 U/L (ref 38–126)
Anion gap: 10 (ref 5–15)
BUN: 29 mg/dL — ABNORMAL HIGH (ref 8–23)
CO2: 24 mmol/L (ref 22–32)
Calcium: 8.1 mg/dL — ABNORMAL LOW (ref 8.9–10.3)
Chloride: 103 mmol/L (ref 98–111)
Creatinine, Ser: 1.45 mg/dL — ABNORMAL HIGH (ref 0.44–1.00)
GFR, Estimated: 40 mL/min — ABNORMAL LOW (ref >60)
Glucose, Bld: 188 mg/dL — ABNORMAL HIGH (ref 70–99)
Potassium: 4.3 mmol/L (ref 3.5–5.1)
Sodium: 137 mmol/L (ref 135–145)
Total Bilirubin: 0.9 mg/dL (ref 0.3–1.2)
Total Protein: 6.5 g/dL (ref 6.5–8.1)

## 2020-08-24 LAB — LIPID PANEL
Cholesterol: 150 mg/dL (ref 0–200)
HDL: 45 mg/dL (ref >40)
LDL Cholesterol: 71 mg/dL (ref 0–99)
Total CHOL/HDL Ratio: 3.3 RATIO
Triglycerides: 170 mg/dL — ABNORMAL HIGH (ref <150)
VLDL: 34 mg/dL (ref 0–40)

## 2020-08-24 LAB — LIPASE, BLOOD: Lipase: 47 U/L (ref 11–51)

## 2020-08-24 LAB — HEMOGLOBIN A1C
Hgb A1c MFr Bld: 7.5 % — ABNORMAL HIGH (ref 4.8–5.6)
Mean Plasma Glucose: 168.55 mg/dL

## 2020-08-24 LAB — TROPONIN I (HIGH SENSITIVITY)
Troponin I (High Sensitivity): 6 ng/L (ref <18)
Troponin I (High Sensitivity): 6 ng/L (ref <18)

## 2020-08-24 LAB — I-STAT CHEM 8, ED
BUN: 40 mg/dL — ABNORMAL HIGH (ref 8–23)
Calcium, Ion: 1.02 mmol/L — ABNORMAL LOW (ref 1.15–1.40)
Chloride: 103 mmol/L (ref 98–111)
Creatinine, Ser: 1.3 mg/dL — ABNORMAL HIGH (ref 0.44–1.00)
Glucose, Bld: 223 mg/dL — ABNORMAL HIGH (ref 70–99)
HCT: 37 % (ref 36.0–46.0)
Hemoglobin: 12.6 g/dL (ref 12.0–15.0)
Potassium: 4.7 mmol/L (ref 3.5–5.1)
Sodium: 139 mmol/L (ref 135–145)
TCO2: 28 mmol/L (ref 22–32)

## 2020-08-24 LAB — HIV ANTIBODY (ROUTINE TESTING W REFLEX): HIV Screen 4th Generation wRfx: NONREACTIVE

## 2020-08-24 LAB — PROTIME-INR
INR: 2.1 — ABNORMAL HIGH (ref 0.8–1.2)
Prothrombin Time: 23.1 seconds — ABNORMAL HIGH (ref 11.4–15.2)

## 2020-08-24 LAB — LACTIC ACID, PLASMA: Lactic Acid, Venous: 2.9 mmol/L (ref 0.5–1.9)

## 2020-08-24 MED ORDER — CLEVIDIPINE BUTYRATE 0.5 MG/ML IV EMUL
0.0000 mg/h | INTRAVENOUS | Status: DC
  Administered 2020-08-24: 21:00:00 8 mg/h via INTRAVENOUS
  Filled 2020-08-24: qty 50

## 2020-08-24 MED ORDER — SODIUM CHLORIDE 0.9 % IV BOLUS
1000.0000 mL | Freq: Once | INTRAVENOUS | Status: AC
  Administered 2020-08-24: 20:00:00 1000 mL via INTRAVENOUS

## 2020-08-24 MED ORDER — PANTOPRAZOLE SODIUM 40 MG IV SOLR
40.0000 mg | Freq: Every day | INTRAVENOUS | Status: DC
  Administered 2020-08-24 – 2020-08-28 (×5): 40 mg via INTRAVENOUS
  Filled 2020-08-24 (×5): qty 40

## 2020-08-24 MED ORDER — ACETAMINOPHEN 160 MG/5ML PO SOLN: 650.0000 mg | ORAL | Status: DC | PRN

## 2020-08-24 MED ORDER — GADOBUTROL 1 MMOL/ML IV SOLN
6.5000 mL | Freq: Once | INTRAVENOUS | Status: AC | PRN
  Administered 2020-08-24: 6.5 mL via INTRAVENOUS

## 2020-08-24 MED ORDER — ONDANSETRON HCL 4 MG/2ML IJ SOLN
INTRAMUSCULAR | Status: AC
  Filled 2020-08-24: qty 2

## 2020-08-24 MED ORDER — ACETAMINOPHEN 650 MG RE SUPP: 650.0000 mg | RECTAL | Status: DC | PRN

## 2020-08-24 MED ORDER — SENNOSIDES-DOCUSATE SODIUM 8.6-50 MG PO TABS
1.0000 | ORAL_TABLET | Freq: Two times a day (BID) | ORAL | Status: DC
  Administered 2020-08-24 – 2020-09-02 (×17): 1 via ORAL
  Filled 2020-08-24 (×17): qty 1

## 2020-08-24 MED ORDER — STROKE: EARLY STAGES OF RECOVERY BOOK: Freq: Once | Status: AC

## 2020-08-24 MED ORDER — ACETAMINOPHEN 325 MG PO TABS
650.0000 mg | ORAL_TABLET | ORAL | Status: DC | PRN
  Filled 2020-08-24 (×2): qty 2

## 2020-08-24 MED ORDER — LABETALOL HCL 5 MG/ML IV SOLN: 20.0000 mg | Freq: Once | INTRAVENOUS | Status: DC

## 2020-08-24 MED ORDER — FENTANYL CITRATE (PF) 100 MCG/2ML IJ SOLN: 50.0000 ug | Freq: Once | INTRAMUSCULAR | Status: DC

## 2020-08-24 MED ORDER — VITAMIN K1 10 MG/ML IJ SOLN
10.0000 mg | INTRAVENOUS | Status: AC
  Administered 2020-08-24: 22:00:00 10 mg via INTRAVENOUS
  Filled 2020-08-24: qty 1

## 2020-08-24 MED ORDER — PROTHROMBIN COMPLEX CONC HUMAN 500 UNITS IV KIT
1586.0000 [IU] | PACK | Status: AC
  Administered 2020-08-24: 21:00:00 1586 [IU] via INTRAVENOUS
  Filled 2020-08-24: qty 586

## 2020-08-24 MED ORDER — DIAZEPAM 5 MG/ML IJ SOLN
INTRAMUSCULAR | Status: AC
  Administered 2020-08-24: 21:00:00 5 mg
  Filled 2020-08-24: qty 2

## 2020-08-24 MED ORDER — IOHEXOL 300 MG/ML  SOLN
100.0000 mL | Freq: Once | INTRAMUSCULAR | Status: AC | PRN
  Administered 2020-08-24: 100 mL via INTRAVENOUS

## 2020-08-24 NOTE — Progress Notes (Signed)
Return from mri

## 2020-08-24 NOTE — ED Triage Notes (Signed)
Pt bib ems for weakness thew past 2 days. Per ems patient was diaphoretic and pale on arrival. Patient has been vomiting (200cc) and 4mg  of zofran and 800cc of foluid. Patient has an extensive cardiac hx and is in afib. Patient is a&ox4.

## 2020-08-24 NOTE — Consult Note (Deleted)
Stroke neurology-admission history and physical Intracerebral hemorrhage   CC: Headache, nausea vomiting  History is obtained from: Patient, chart, patient's daughter at bedside  HPI: Deborah [name] is a 68 y.o. female past medical history of chronic atrial fibrillation, rheumatic heart disease status post mitral valve replacement and tricuspid valve replacement currently on Coumadin supratherapeutic 2 days ago, hypertension, hyperlipidemia, obesity, presented to the emergency room for sudden onset of headache this afternoon after having had some mild headache for at least 1 to 2 days.  The daughter reports that she had been complaining of some mild headache yesterday but it was sometime this afternoon that her headache become really worse and she was also more drowsy.  She also started complaining of nausea and started vomiting.  She vomited multiple times.  EMS was called.  They brought her in as an abdominal pain-nausea/vomiting patient.  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 Neurology was admitted for further evaluation, and management and admission.  The daughter reports that her blood pressures usually are well controlled.  She is independent at baseline.  Lives with family.  Currently retired.  Has received [employer] COVID-19 vaccine few months ago.  Not received booster yet.   LKW: 1 to 2 days ago tpa given?: no, ICH Premorbid modified Rankin scale (mRS): 0 ICH score-1-for infratentorial origin  ROS: Obtained and negative except as noted in HPI  Past Medical History:  Diagnosis Date  . Allergic rhinitis   . Anemia   . Borderline diabetes   . Carpal tunnel syndrome   . Chronic atrial fibrillation (HCC)     coumadin managed by [facility]. CVRR;  Event montor (7/12-8/12) showed no high rate episodes (patient continuously in atrial fibrillation).   . Chronic diastolic heart failure (HCC)   . Eosinophilia 01/27/2013  . External hemorrhoid   . External hemorrhoids   . Fatigue   . Gout   . 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 Gaylyn Rong  . Hx of cardiovascular stress test    ETT-myoview (7/12): No evidence for ischemia or infarction  . Hyperlipidemia   . Hypertension   . Obesity   . Rheumatic heart disease    Status post mechanical ([employer]. Jude) mitral valve replacement and tricuspid valve repair at [facility] in 2002; echo 5/12:   EF 60-65%, mild AI, mitral valve prosthesis with AVA 1.66, severe LAE, moderate RAE, moderate to severe TR, mild increased pulmonary artery systolic pressure;    Adenosine Cardiolite in 4/09:   No ischemia, EF 66%  . Rotator cuff syndrome    Family History  Problem Relation Age of Onset  . Stroke Mother   . Diabetes Mother   . Heart failure Mother        pacemaker  . Heart disease Father        "heart problems"  . Heart disease Sister   . Heart disease Brother   . Gout Brother   . Diabetes Brother   . Stroke Brother   . Valvular heart disease Sister     Social History:   reports that she has never smoked. She has never used smokeless tobacco. She reports that she does not drink alcohol  and does not use drugs.  Medications  Current Facility-Administered Medications:  .  ondansetron (ZOFRAN) 4 MG/2ML injection, , , ,  .  fentaNYL (SUBLIMAZE) injection 50 mcg, 50 mcg, Intravenous, Once, Curatolo, Adam, DO .  phytonadione (VITAMIN K) 10 mg in dextrose 5 % 50 mL IVPB, 10 mg, Intravenous, STAT, Curatolo, Adam, DO .  prothrombin complex conc human (KCENTRA) IVPB 1,586 Units, 1,586 Units, Intravenous, STAT, Curatolo, Adam, DO  Current Outpatient Medications:  .  ACCU-CHEK SOFTCLIX LANCETS lancets, USE AS DIRECTED TO test  blood sugar, Disp: 100 each, Rfl: 6 .  acetaminophen (TYLENOL) 500 MG tablet, Take 500-1,000 mg by mouth every 6 (six) hours as needed for fever (pain)., Disp: , Rfl:  .  allopurinol (ZYLOPRIM) 300 MG tablet, TAKE 1 TABLET BY MOUTH EVERY DAY, Disp: 90 tablet, Rfl: 3 .  aspirin 81 MG chewable tablet, Chew 81 mg by mouth daily., Disp: , Rfl:  .  cetirizine (ZYRTEC) 10 MG tablet, Take 10 mg by mouth daily., Disp: , Rfl:  .  cyanocobalamin 500 MCG tablet, Take 1 tablet (500 mcg total) by mouth daily., Disp: 30 tablet, Rfl: 5 .  cycloSPORINE (RESTASIS) 0.05 % ophthalmic emulsion, Place 1 drop into both eyes daily as needed. , Disp: , Rfl:  .  diazepam (VALIUM) 5 MG tablet, Take 5 mg by mouth every 8 (eight) hours as needed for anxiety. , Disp: , Rfl:  .  diclofenac Sodium (VOLTAREN) 1 % GEL, APPLY 2 GRAMS TO AFFECTED AREA 4 TIMES A DAY, Disp: 100 g, Rfl: 3 .  famotidine (PEPCID) 20 MG tablet, Take 20 mg by mouth daily., Disp: , Rfl:  .  folic acid (FOLVITE) 1 MG tablet, TAKE 1 TABLET BY MOUTH EVERY DAY, Disp: 90 tablet, Rfl: 3 .  glucose blood (ACCU-CHEK AVIVA PLUS) test strip, 1 each by Other route as needed for other. Use as needed for symptoms of low or high blood sugars, Disp: 100 strip, Rfl: 12 .  isosorbide mononitrate (IMDUR) 30 MG 24 hr tablet, Take 30 mg by mouth daily., Disp: , Rfl:  .  meclizine (ANTIVERT) 25 MG tablet, TAKE 1 TABLET (25 MG TOTAL) BY MOUTH 3 (THREE) TIMES DAILY AS NEEDED FOR DIZZINESS., Disp: 30 tablet, Rfl: 0 .  Menthol-Methyl Salicylate (TIGER BALM LINIMENT EX), Apply 1 application topically daily., Disp: , Rfl:  .  metFORMIN (GLUCOPHAGE) 500 MG tablet, TAKE 1 TABLET (500 MG TOTAL) BY MOUTH 2 (TWO) TIMES DAILY WITH A MEAL., Disp: 180 tablet, Rfl: 1 .  metoprolol succinate (TOPROL-XL) 50 MG 24 hr tablet, TAKE 1 TABLET BY MOUTH EVERY DAY, Disp: 90 tablet, Rfl: 3 .  nitroGLYCERIN (NITROSTAT) 0.4 MG SL tablet, Place 0.4 mg under the tongue every 5 (five) minutes as needed for  chest pain., Disp: , Rfl:  .  Olopatadine HCl (PAZEO) 0.7 % SOLN, Place 1 drop into both eyes daily as needed (itching/ irritation)., Disp: , Rfl:  .  omeprazole (PRILOSEC) 20 MG capsule, TAKE 1 CAPSULE 2 (TWO) TIMES DAILY BEFORE A MEAL. TAKE WITH OMEPRAZOLE 40 MG TO EQUAL 60 MG, Disp: 180 capsule, Rfl: 3 .  potassium chloride SA (KLOR-CON M20) 20 MEQ tablet, Take 3 tablets (60 mEq total) by mouth 2 (two) times daily., Disp: 540 tablet, Rfl: 2 .  rosuvastatin (CRESTOR) 10 MG tablet, TAKE 1 TABLET BY MOUTH EVERY DAY, Disp: 90 tablet, Rfl: 3 .  torsemide (DEMADEX) 20 MG tablet, TAKE 4 TABLETS (80 MG TOTAL) EVERY MORNING AND 3 TABLETS (60 MG  TOTAL) EVERY EVENING., Disp: 630 tablet, Rfl: 1 .  warfarin (COUMADIN) 2 MG tablet, TAKE 1/2-1 TABLET BY MOUTH EVERY DAY OR AS DIRECTED BY COUMADIN CLINIC, Disp: 75 tablet, Rfl: 1 .  zinc sulfate 220 (50 Zn) MG capsule, Take 220 mg by mouth daily., Disp: , Rfl:    Allergies: Phenergan-severe agitation, altered mental status.  Exam: Current vital signs: BP 107/65   Pulse 75   Temp 97.8 F (36.6 C) (Rectal)   Resp (!) 27   Ht 4\' 11"  (1.499 m)   Wt 67.8 kg   SpO2 99%   BMI 30.19 kg/m  Vital signs in last 24 hours: Temp:  [97.8 F (36.6 C)] 97.8 F (36.6 C) (12/29 2019) Pulse Rate:  [74-76] 75 (12/29 2130) Resp:  [25-30] 27 (12/29 2130) BP: (107-175)/(65-84) 107/65 (12/29 2130) SpO2:  [94 %-99 %] 99 % (12/29 2130) Weight:  [67.8 kg] 67.8 kg (12/29 1955) General: Patient is drowsy, opens eyes to voice, appears uncomfortable due to nausea. HEENT: Normocephalic, atraumatic, dry vomitus on her oral cavity. CVS: Irregularly irregular in A. fib on the monitor. Respiratory: Chest appears clear to auscultation Extremities: Warm well perfused with some bruises on the leg Abdomen nondistended nontender. Neurological exam Drowsy but opens eyes to voice. Mildly dysarthric Poor attention concentration No evidence of aphasia Follows commands. Is able to  answer simple questions in [place]. Was able to tell me her correct age and the month. Cranial: Pupils equal round reactive light, extraocular movements appear intact with the limitation being that every time she opens her eyes for any extended period of time, she is more nauseous and she closes them immediately.  Facial symmetry is preserved.  Facial sensation is preserved.  Tongue and palate are midline. Motor exam: She has generalized weakness in all 4 extremities and is unable to keep bilateral upper extremities above antigravity and they hit the bed before 10 seconds.  Both legs also go antigravity but hit the head before 5 seconds. Sensory exam: Intact to touch Coordination: Very difficult to perform given her mentation and poor attention concentration. NIH stroke scale 1a Level of Conscious.: 1 1b LOC Questions: 0 1c LOC Commands:0  2 Best Gaze: 0 3 Visual: 0 4 Facial Palsy:0  5a Motor Arm - left: 2 5b Motor Arm - Right:2  6a Motor Leg - Left: 2 6b Motor Leg - Right:2  7 Limb Ataxia: 0 8 Sensory: 0 9 Best Language: 0 10 Dysarthria: 1 11 Extinct. and Inatten.:0  TOTAL: 10  Labs I have reviewed labs in epic and the results pertinent to this consultation are: No leukocytosis.  Anemia with hemoglobin 11.  MCV 76.7.  Platelet count 201. CBC    Component Value Date/Time   WBC 10.1 08/24/2020 2003   RBC 4.73 08/24/2020 2003   HGB 11.0 (L) 08/24/2020 2003   HGB 11.1 07/15/2018 1506   HGB 11.8 07/15/2017 0919   HCT 36.3 08/24/2020 2003   HCT 36.4 07/15/2018 1506   HCT 37.4 07/15/2017 0919   PLT 201 08/24/2020 2003   PLT 218 07/15/2018 1506   MCV 76.7 (L) 08/24/2020 2003   MCV 77 (L) 07/15/2018 1506   MCV 74.9 (L) 07/15/2017 0919   MCH 23.3 (L) 08/24/2020 2003   MCHC 30.3 08/24/2020 2003   RDW 17.7 (H) 08/24/2020 2003   RDW 16.4 (H) 07/15/2018 1506   RDW 16.5 (H) 07/15/2017 0919   LYMPHSABS 3.9 08/24/2020 2003   LYMPHSABS 2.4 07/15/2017 0919   MONOABS 1.0 08/24/2020  2003   MONOABS 0.8 07/15/2017 0919   EOSABS 0.7 (H) 08/24/2020 2003   EOSABS 0.3 07/15/2017 0919   BASOSABS 0.1 08/24/2020 2003   BASOSABS 0.1 07/15/2017 0919    CMP pending from today.    Component Value Date/Time   NA 135 06/01/2020 1033   NA 142 07/15/2018 1506   NA 142 07/15/2017 0919   K 3.9 06/01/2020 1033   K 4.3 07/15/2017 0919   CL 99 06/01/2020 1033   CL 108 (H) 02/12/2013 0813   CO2 22 06/01/2020 1033   CO2 29 07/15/2017 0919   GLUCOSE 273 (H) 06/01/2020 1033   GLUCOSE 125 07/15/2017 0919   GLUCOSE 115 (H) 02/12/2013 0813   BUN 21 06/01/2020 1033   BUN 16 07/15/2018 1506   BUN 30.2 (H) 07/15/2017 0919   CREATININE 1.47 (H) 06/01/2020 1033   CREATININE 1.5 (H) 07/15/2017 0919   CALCIUM 9.2 06/01/2020 1033   CALCIUM 10.2 07/15/2017 0919   PROT 7.1 11/02/2019 1048   PROT 8.1 07/15/2017 0919   ALBUMIN 3.9 11/02/2019 1048   ALBUMIN 4.2 07/15/2017 0919   AST 27 11/02/2019 1048   AST 26 07/15/2017 0919   ALT 27 11/02/2019 1048   ALT 28 07/15/2017 0919   ALKPHOS 79 11/02/2019 1048   ALKPHOS 80 07/15/2017 0919   BILITOT 1.2 11/02/2019 1048   BILITOT 1.24 (H) 07/15/2017 0919   GFRNONAA 37 (L) 06/01/2020 1033   GFRNONAA 54 (L) 09/05/2016 1127   GFRAA 44 (L) 05/16/2020 1126   GFRAA 62 09/05/2016 1127   PT/INR-INR 4.3 on 08/22/2020. Lactate 2.9  Imaging I have reviewed the images obtained:  CT-scan of the brain-Focal ICH within the inter cerebellar hemisphere with mass-effect upon the fourth ventricle.  No downward herniation.  No hydrocephalus.  No extra-axial collections.  Age-related atrophy and chronic small vessel ischemia  CT abdomen and pelvis-with contrast was done due to the presentation with nausea and vomiting-which shows large amount of stool in the sigmoid and rectum.  No other abnormality seen in abdomen or pelvis.  Aortic atherosclerosis seen.  Assessment:  69 year old woman on Coumadin as above presenting with 2-day history of headache which  severely worsened with addition of nausea and vomiting this afternoon and somewhat depressed level of consciousness brought in for evaluation found to have a cerebellar ICH-likely secondary to coagulopathy from Coumadin and some contribution from hypertension-highest recorded systolic blood pressure was in the high 170s. No history of trauma making traumatic bleed less likely. Unable to emergently perform CTA head and neck for any vascular etiology as she has received contrast for CT abdomen and pelvis with contrast. We will recommend MRI with and without contrast for further evaluation Mainstay of the treatment will be blood pressure management, reversal of coagulopathy and close neuro monitoring. Surgical intervention might be needed-we will assess on ongoing basis.  Assessment:   Plan: Cerebellum ICH, nontraumatic Acuity: Acute Current suspected etiology: Coagulopathy, hypertension Treatment: -Admit to-neurological ICU under stroke neurology service. -ICH Score: 1 -BP control goal SYS<140 -PT/OT/ST  -neuromonitoring -Low threshold for consulting neurosurgery if clinical deterioration or imaging deterioration including hydrocephalus.  Currently has an open fourth ventricle with no evidence of hydrocephalus. -Repeat head scan-MRI ordered for 3 AM with and without contrast.  If unable to do MRI, will do CT scan at that time.  CNS Cerebral edema Compression of brain -NS at 75cc/h for now. Consider hypertonic if repeat CTH shows increased cerebral edema -Close neuro monitoring  Dysarthria Dysphagia following ICH  -  NPO until cleared by speech -ST  RESP Possible aspiration pneumonia-had large amounts of vomiting prior to arrival.  Also had some vomiting on arrival. -chest x-ray -Hold off antibiotics-no leukocytosis.  Will consider antibiotics if chest x-ray shows aspiration  CV Hypertensive Encephalopathy Hypertensive Emergency -Aggressive BP control, goal SBP <140 -Labetalol  and hydralazine as needed.  Cleviprex drip -TTE  Chronic atrial -fibrillation -Rate control -Hold Coumadin due to ICH. -Consult cardiology in the morning regarding management of the valve and atrial fibrillation in the setting of ICH to have them on board Had a detailed discussion with the daughter that at this time due to the ICH, we will have to hold the anticoagulation which makes her more susceptible for ischemic stroke events.  GI/GU Based on her prior labs, she has CKD 3. -Gentle hydration -avoid nephrotoxic agents -renal consult  HEME Iron Deficiency Anemia -Monitor -transfuse for hgb < 7  Coagulopathy secondary to anticoagulation -Goal INR is 1.6 or less at this time. -Reverse with PCC, vitamin K. -Trend PT/PTT/INR -Get cardiology on board for input on resumption of anticoagulation.  ENDO Type 2 diabetes mellitus with hyperglycemia  -SSI -goal HgbA1c < 7  Fluid/Electrolyte Disorders Labs pending Replete as necessary Check a.m. labs   ID Possible Aspiration PNA -CXR -NPO Discussed with EDP.  Check urinalysis  Lactic acidosis Gentle hydration and recheck lactate. If still remains elevated, blood cultures and empiric antibiotic coverage  Nutrition E66.9 Obesity  -diet consult  Prophylaxis DVT: SCDs-no antiplatelets or anticoagulants given the ICH GI: PPI Bowel: Docusate senna  Dispo: To be decided  Diet: Keep her n.p.o. overnight at least.  Do a formal swallow screen in the morning and decide on diet at that time.  Code Status: Full Code-confirmed with the daughter.   THE FOLLOWING WERE PRESENT ON ADMISSION: ICH, cerebral edema, hypertensive encephalopathy, hypertensive emergency, probable aspiration pneumonia, chronic atrial fibrillation, CKD 3, coagulopathy secondary to Coumadin   Discussed my plan in detail with Dr. Kandyce Rud provider as well as patient's daughter.  Answered all questions to the best of my ability.    -- Milon Dikes,  MD [facility] Pager: [phone] If 7pm to 7am, please call on call as listed on AMION.     CRITICAL CARE ATTESTATION Performed by: Milon Dikes, MD Total critical care time: 60 minutes Critical care time was exclusive of separately billable procedures and treating other patients and/or supervising APPs/Residents/Students Critical care was necessary to treat or prevent imminent or life-threatening deterioration due to intracerebral hemorrhage, cerebellar hemorrhage, hypertensive emergency, aspiration pneumonia This patient is critically ill and at significant risk for neurological worsening and/or death and care requires constant monitoring. Critical care was time spent personally by me on the following activities: development of treatment plan with patient and/or surrogate as well as nursing, discussions with consultants, evaluation of patient's response to treatment, examination of patient, obtaining history from patient or surrogate, ordering and performing treatments and interventions, ordering and review of laboratory studies, ordering and review of radiographic studies, pulse oximetry, re-evaluation of patient's condition, participation in multidisciplinary rounds and medical decision making of high complexity in the care of this patient.

## 2020-08-24 NOTE — ED Provider Notes (Signed)
[facility] EMERGENCY DEPARTMENT Provider Note   CSN: [id number] Arrival date & time: 08/24/20  1950     History Chief Complaint  Patient presents with  . Weakness    Deborah [name] is a 69 y.o. female.  Overall patient too uncomfortable to provide much history but states that she has not been feeling well the last 2 days.  Started to develop some nausea and vomiting, epigastric abdominal pain may be headache.  She denies any chest pain or shortness of breath.  She has been vaccinated against Covid.  She denies any black stool or bloody stools.  Overall difficult to obtain history due to discomfort.  Normal vitals upon arrival of EMS.  They gave her fluid bolus and Zofran.  The history is provided by the patient.  Weakness Severity:  Mild Onset quality:  Gradual Timing:  Constant Progression:  Unchanged Chronicity:  New Relieved by:  Nothing Worsened by:  Nothing Associated symptoms: abdominal pain, headaches, nausea and vomiting   Associated symptoms: no arthralgias, no chest pain, no cough, no dysuria, no fever, no seizures and no shortness of breath        Past Medical History:  Diagnosis Date  . Allergic rhinitis   . Anemia   . Borderline diabetes   . Carpal tunnel syndrome   . Chronic atrial fibrillation (HCC)    coumadin managed by [facility]. CVRR;  Event montor (7/12-8/12) showed no high rate episodes (patient continuously in atrial fibrillation).   . Chronic diastolic heart failure (HCC)   . Eosinophilia 01/27/2013  . External hemorrhoid   . External hemorrhoids   . Fatigue   . Gout   . 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 Gaylyn Rong  . Hx of cardiovascular stress test    ETT-myoview (7/12): No evidence for ischemia or infarction  . Hyperlipidemia   . Hypertension   . Obesity   . Rheumatic heart disease    Status post mechanical ([employer]) mitral valve replacement and tricuspid valve  repair at [facility] in 2002; echo 5/12:   EF 60-65%, mild AI, mitral valve prosthesis with AVA 1.66, severe LAE, moderate RAE, moderate to severe TR, mild increased pulmonary artery systolic pressure;    Adenosine Cardiolite in 4/09:   No ischemia, EF 66%  . Rotator cuff syndrome     Patient Active Problem List   Diagnosis Date Noted  . ICH (intracerebral hemorrhage) (HCC) 08/24/2020  . Rash and nonspecific skin eruption 12/14/2019  . Breast lesion 12/14/2019  . Encounter for screening mammogram for malignant neoplasm of breast 12/14/2019  . Rash in adult 12/14/2019  . Close exposure to COVID-19 virus 08/10/2019  . Unstable angina (HCC) 04/08/2019  . Chest pain 04/08/2019  . Cough 07/11/2018  . Chronic pain of both ankles 09/30/2017  . Pain in joint involving ankle and foot 09/16/2017  . NAFLD (nonalcoholic fatty liver disease) 40/98/1191  . Knee pain, bilateral 04/12/2017  . Low back pain without sciatica 06/12/2016  . Nonspecific chest pain   . Vision changes 02/21/2016  . RUQ pain 03/24/2015  . 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 Encounter for therapeutic drug monitoring 09/30/2013  . 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 H/O prosthetic heart valve 07/08/2013  . Eosinophilia 01/27/2013  . Microcytic anemia 05/01/2012  . 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 Postprocedural state 05/22/2011  . Diastolic CHF, chronic (HCC) 03/15/2011  . Rheumatic  heart disease   . Chronic atrial fibrillation (HCC)   . Chronic anticoagulation   . DM (diabetes mellitus) (HCC)   . Hyperlipidemia   . Mitral valve disorder 11/16/2010  . GOUT 11/29/2009  . TRICUSPID REGURGITATION, MODERATE WITH MILD PULMONARY HTN 11/29/2009  . HYPERBILIRUBINEMIA 06/22/2009  . HYPERGLYCEMIA 06/21/2009  . Hyperlipidemia associated with type 2 diabetes mellitus (HCC) 01/07/2009  . GERD 01/07/2009  . CARPAL TUNNEL SYNDROME, LEFT 11/11/2008    Past Surgical History:  Procedure Laterality  Date  . CARDIAC CATHETERIZATION     EF 55%  . TRICUSPID VALVE REPAIR       OB History   No obstetric history on file.     Family History  Problem Relation Age of Onset  . Stroke Mother   . Diabetes Mother   . Heart failure Mother        pacemaker  . Heart disease Father        "heart problems"  . Heart disease Sister   . Heart disease Brother   . Gout Brother   . Diabetes Brother   . Stroke Brother   . Valvular heart disease Sister     Social History   Tobacco Use  . Smoking status: Never Smoker  . Smokeless tobacco: Never Used  Substance Use Topics  . Alcohol use: No  . Drug use: No    Home Medications Prior to Admission medications   Medication Sig Start Date End Date Taking? Authorizing Provider  ACCU-CHEK SOFTCLIX LANCETS lancets USE AS DIRECTED TO test blood sugar 09/26/17   Freddrick March, MD  acetaminophen (TYLENOL) 500 MG tablet Take 500-1,000 mg by mouth every 6 (six) hours as needed for fever (pain).    [provider]  allopurinol (ZYLOPRIM) 300 MG tablet TAKE 1 TABLET BY MOUTH EVERY DAY 03/23/20   Bensimhon, Bevelyn Buckles, MD  aspirin 81 MG chewable tablet Chew 81 mg by mouth daily.    [provider]  cetirizine (ZYRTEC) 10 MG tablet Take 10 mg by mouth daily.    [provider]  cyanocobalamin 500 MCG tablet Take 1 tablet (500 mcg total) by mouth daily. 08/13/14   Malachy Mood, MD  cycloSPORINE (RESTASIS) 0.05 % ophthalmic emulsion Place 1 drop into both eyes daily as needed.     [provider]  diazepam (VALIUM) 5 MG tablet Take 5 mg by mouth every 8 (eight) hours as needed for anxiety.  12/17/14   [provider]  diclofenac Sodium (VOLTAREN) 1 % GEL APPLY 2 GRAMS TO AFFECTED AREA 4 TIMES A DAY 03/23/20   Anderson, Chelsey L, DO  famotidine (PEPCID) 20 MG tablet Take 20 mg by mouth daily.    [provider]  folic acid (FOLVITE) 1 MG tablet TAKE 1 TABLET BY MOUTH EVERY DAY 03/28/20   Bensimhon, Bevelyn Buckles, MD   glucose blood (ACCU-CHEK AVIVA PLUS) test strip 1 each by Other route as needed for other. Use as needed for symptoms of low or high blood sugars 08/15/20   Anderson, Chelsey L, DO  isosorbide mononitrate (IMDUR) 30 MG 24 hr tablet Take 30 mg by mouth daily.    [provider]  meclizine (ANTIVERT) 25 MG tablet TAKE 1 TABLET (25 MG TOTAL) BY MOUTH 3 (THREE) TIMES DAILY AS NEEDED FOR DIZZINESS. 05/13/19   Bensimhon, Bevelyn Buckles, MD  Menthol-Methyl Salicylate (TIGER BALM LINIMENT EX) Apply 1 application topically daily.    [provider]  metFORMIN (GLUCOPHAGE) 500 MG tablet TAKE 1 TABLET (  500 MG TOTAL) BY MOUTH 2 (TWO) TIMES DAILY WITH A MEAL. 05/19/20   Anderson, Chelsey L, DO  metoprolol succinate (TOPROL-XL) 50 MG 24 hr tablet TAKE 1 TABLET BY MOUTH EVERY DAY 12/25/19   Laurey Morale, MD  nitroGLYCERIN (NITROSTAT) 0.4 MG SL tablet Place 0.4 mg under the tongue every 5 (five) minutes as needed for chest pain.    [provider]  Olopatadine HCl (PAZEO) 0.7 % SOLN Place 1 drop into both eyes daily as needed (itching/ irritation).    [provider]  omeprazole (PRILOSEC) 20 MG capsule TAKE 1 CAPSULE 2 (TWO) TIMES DAILY BEFORE A MEAL. TAKE WITH OMEPRAZOLE 40 MG TO EQUAL 60 MG 03/28/20   Laurey Morale, MD  potassium chloride SA (KLOR-CON M20) 20 MEQ tablet Take 3 tablets (60 mEq total) by mouth 2 (two) times daily. 05/16/20   Laurey Morale, MD  rosuvastatin (CRESTOR) 10 MG tablet TAKE 1 TABLET BY MOUTH EVERY DAY 03/22/20   Laurey Morale, MD  torsemide (DEMADEX) 20 MG tablet TAKE 4 TABLETS (80 MG TOTAL) EVERY MORNING AND 3 TABLETS (60 MG TOTAL) EVERY EVENING. 07/27/20   Laurey Morale, MD  warfarin (COUMADIN) 2 MG tablet TAKE 1/2-1 TABLET BY MOUTH EVERY DAY OR AS DIRECTED BY COUMADIN CLINIC 07/27/20   Laurey Morale, MD  zinc sulfate 220 (50 Zn) MG capsule Take 220 mg by mouth daily.    [provider]    Allergies    Promethazine hcl  Review of  Systems   Review of Systems  Constitutional: Positive for fatigue. Negative for chills and fever.  HENT: Negative for ear pain and sore throat.   Eyes: Negative for pain and visual disturbance.  Respiratory: Negative for cough and shortness of breath.   Cardiovascular: Negative for chest pain and palpitations.  Gastrointestinal: Positive for abdominal pain, nausea and vomiting.  Genitourinary: Negative for dysuria and hematuria.  Musculoskeletal: Negative for arthralgias and back pain.  Skin: Negative for color change and rash.  Neurological: Positive for weakness and headaches. Negative for seizures and syncope.  All other systems reviewed and are negative.   Physical Exam Updated Vital Signs  ED Triage Vitals  Enc Vitals Group     BP --      Pulse --      Resp --      Temp 08/24/20 2019 97.8 F (36.6 C)     Temp Source 08/24/20 2019 Rectal     SpO2 --      Weight 08/24/20 1955 149 lb 7.6 oz (67.8 kg)     Height 08/24/20 1955 4\' 11"  (1.499 m)     Head Circumference --      Peak Flow --      Pain Score 08/24/20 1954 0     Pain Loc --      Pain Edu? --      Excl. in GC? --     Physical Exam Vitals and nursing note reviewed.  Constitutional:      General: She is in acute distress.     Appearance: She is well-developed and well-nourished. She is ill-appearing.  HENT:     Head: Normocephalic and atraumatic.     Nose: Nose normal.     Mouth/Throat:     Mouth: Mucous membranes are moist.  Eyes:     Extraocular Movements: Extraocular movements intact.     Conjunctiva/sclera: Conjunctivae normal.     Pupils: Pupils are equal, round, and reactive to  light.  Cardiovascular:     Rate and Rhythm: Normal rate and regular rhythm.     Pulses: Normal pulses.     Heart sounds: Normal heart sounds. No murmur heard.   Pulmonary:     Effort: Pulmonary effort is normal. No respiratory distress.     Breath sounds: Normal breath sounds.  Abdominal:     General: There is  distension.     Palpations: Abdomen is soft.     Tenderness: There is abdominal tenderness (epigastric).  Musculoskeletal:        General: No swelling, tenderness or edema. Normal range of motion.     Cervical back: Normal range of motion and neck supple.  Skin:    General: Skin is warm and dry.     Capillary Refill: Capillary refill takes less than 2 seconds.  Neurological:     General: No focal deficit present.     Mental Status: She is alert and oriented to person, place, and time.     Cranial Nerves: No cranial nerve deficit.     Sensory: No sensory deficit.     Motor: No weakness.     Coordination: Coordination normal.     Comments: Grossly has normal strength and sensation, pupils are equal and reactive  Psychiatric:        Mood and Affect: Mood and affect normal.     ED Results / Procedures / Treatments   Labs (all labs ordered are listed, but only abnormal results are displayed) Labs Reviewed  CBC WITH DIFFERENTIAL/PLATELET - Abnormal; Notable for the following components:      Result Value   Hemoglobin 11.0 (*)    MCV 76.7 (*)    MCH 23.3 (*)    RDW 17.7 (*)    nRBC 0.3 (*)    Eosinophils Absolute 0.7 (*)    All other components within normal limits  LACTIC ACID, PLASMA - Abnormal; Notable for the following components:   Lactic Acid, Venous 2.9 (*)    All other components within normal limits  CULTURE, BLOOD (ROUTINE X 2)  CULTURE, BLOOD (ROUTINE X 2)  URINALYSIS, ROUTINE W REFLEX MICROSCOPIC  PROTIME-INR  HIV ANTIBODY (ROUTINE TESTING W REFLEX)  HEMOGLOBIN A1C  LIPID PANEL  COMPREHENSIVE METABOLIC PANEL  LIPASE, BLOOD  PROTIME-INR  I-STAT CHEM 8, ED  POC SARS CORONAVIRUS 2 AG -  ED  TROPONIN I (HIGH SENSITIVITY)    EKG EKG Interpretation  Date/Time:  Wednesday August 24 2020 20:04:19 EST Ventricular Rate:  80 PR Interval:    QRS Duration: 100 QT Interval:  449 QTC Calculation: 518 R Axis:   78 Text Interpretation: Atrial fibrillation Low  voltage, precordial leads Borderline T abnormalities, anterior leads Prolonged QT interval Confirmed by Virgina Norfolk [phone]) on 08/24/2020 8:10:23 PM   Radiology CT Head Wo Contrast  Result Date: 08/24/2020 CLINICAL DATA:  Mental status change EXAM: CT HEAD WITHOUT CONTRAST TECHNIQUE: Contiguous axial images were obtained from the base of the skull through the vertex without intravenous contrast. COMPARISON:  None. FINDINGS: Brain: Rounded intraparenchymal hemorrhage seen within the inter cerebellar hemisphere measuring 1.6 x 1.4 x 1.8 cm. There is mild surrounding mass effect upon the fourth ventricle. No downward herniation is seen. No extra-axial collections or other intraparenchymal hemorrhage is seen. There is mild dilatation the ventricles and sulci consistent with age-related atrophy. Low-attenuation changes in the deep white matter consistent with small vessel ischemia. Vascular: No hyperdense vessel or unexpected calcification. Skull: The skull is intact. No fracture or  focal lesion identified. Sinuses/Orbits: The visualized paranasal sinuses and mastoid air cells are clear. The orbits and globes intact. Other: None IMPRESSION: Focal intraparenchymal hemorrhage within the inter cerebellar hemisphere with mass effect upon the fourth ventricle. No downward herniation. No extra-axial collections. Findings consistent with mild age related atrophy and chronic small vessel ischemia These results were called by telephone at the time of interpretation on 08/24/2020 at 8:56 pm to provider Tracker Mance , who verbally acknowledged these results. Electronically Signed   By: Jonna Clark M.D.   On: 08/24/2020 20:56   CT ABDOMEN PELVIS W CONTRAST  Result Date: 08/24/2020 CLINICAL DATA:  Epigastric abdominal pain. EXAM: CT ABDOMEN AND PELVIS WITH CONTRAST TECHNIQUE: Multidetector CT imaging of the abdomen and pelvis was performed using the standard protocol following bolus administration of intravenous  contrast. CONTRAST:  OMNIPAQUE IOHEXOL 300 MG/ML  SOLN COMPARISON:  None. FINDINGS: Lower chest: No acute abnormality. Hepatobiliary: No focal liver abnormality is seen. No gallstones, gallbladder wall thickening, or biliary dilatation. Pancreas: Unremarkable. No pancreatic ductal dilatation or surrounding inflammatory changes. Spleen: Normal in size without focal abnormality. Adrenals/Urinary Tract: Adrenal glands are unremarkable. Kidneys are normal, without renal calculi, focal lesion, or hydronephrosis. Bladder is unremarkable. Stomach/Bowel: Stomach appears normal. There is no evidence of bowel obstruction or inflammation. The appendix appears normal. Large amount of stool is seen in the sigmoid colon and rectum. Vascular/Lymphatic: Aortic atherosclerosis. No enlarged abdominal or pelvic lymph nodes. Reproductive: Uterus and bilateral adnexa are unremarkable. Other: No abdominal wall hernia or abnormality. No abdominopelvic ascites. Musculoskeletal: No acute or significant osseous findings. IMPRESSION: 1. Aortic atherosclerosis. 2. Large amount of stool is seen in the sigmoid colon and rectum. 3. No other abnormality seen in the abdomen or pelvis. Aortic Atherosclerosis (ICD10-I70.0). Electronically Signed   By: Lupita Raider M.D.   On: 08/24/2020 20:53   DG Chest Portable 1 View  Result Date: 08/24/2020 CLINICAL DATA:  Altered mental status. EXAM: PORTABLE CHEST 1 VIEW COMPARISON:  April 07, 2019. FINDINGS: Stable cardiomegaly. No pneumothorax or pleural effusion is noted. Sternotomy wires are noted. Lungs are clear. Bony thorax is unremarkable. IMPRESSION: No active disease. Electronically Signed   By: Lupita Raider M.D.   On: 08/24/2020 20:36    Procedures .Critical Care Performed by: Virgina Norfolk, DO Authorized by: Virgina Norfolk, DO   Critical care provider statement:    Critical care time (minutes):  45   Critical care was necessary to treat or prevent imminent or  life-threatening deterioration of the following conditions:  CNS failure or compromise   Critical care was time spent personally by me on the following activities:  Discussions with consultants, evaluation of patient's response to treatment, examination of patient, ordering and performing treatments and interventions, ordering and review of laboratory studies, ordering and review of radiographic studies, pulse oximetry, re-evaluation of patient's condition, obtaining history from patient or surrogate and review of old charts   (including critical care time)  Medications Ordered in ED Medications  fentaNYL (SUBLIMAZE) injection 50 mcg (has no administration in time range)  prothrombin complex conc human (KCENTRA) IVPB 1,586 Units (1,586 Units Intravenous New Bag/Given 08/24/20 2112)  phytonadione (VITAMIN K) 10 mg in dextrose 5 % 50 mL IVPB (has no administration in time range)   stroke: mapping our early stages of recovery book (has no administration in time range)  acetaminophen (TYLENOL) tablet 650 mg (has no administration in time range)    Or  acetaminophen (TYLENOL) 160 MG/5ML solution 650  mg (has no administration in time range)    Or  acetaminophen (TYLENOL) suppository 650 mg (has no administration in time range)  senna-docusate (Senokot-S) tablet 1 tablet (has no administration in time range)  pantoprazole (PROTONIX) injection 40 mg (has no administration in time range)  labetalol (NORMODYNE) injection 20 mg (has no administration in time range)    And  clevidipine (CLEVIPREX) infusion 0.5 mg/mL (16 mg/hr Intravenous New Bag/Given 08/24/20 2120)  diazepam (VALIUM) 5 MG/ML injection (has no administration in time range)  sodium chloride 0.9 % bolus 1,000 mL (1,000 mLs Intravenous New Bag/Given 08/24/20 2017)  iohexol (OMNIPAQUE) 300 MG/ML solution 100 mL (100 mLs Intravenous Contrast Given 08/24/20 2039)  ondansetron (ZOFRAN) 4 MG/2ML injection (  Given 08/24/20 2108)    ED Course   I have reviewed the triage vital signs and the nursing notes.  Pertinent labs & imaging results that were available during my care of the patient were reviewed by me and considered in my medical decision making (see chart for details).    MDM Rules/Calculators/A&P                          Deborah [name] is a 69 year old female with history of high cholesterol, diabetes who presents to the ED with nausea and vomiting, headache, abdominal pain.  Patient unable to provide much history as she is not feeling very well.  It seems as though she is having a headache and some nausea and vomiting and may be some vertigo symptoms.  She states that her stomach is hurting her.  Supposedly her last INR was elevated above 4 and overall I have a high suspicion for head bleed and patient was directly taken to CT scan for head CT as well as a CT of her abdomen and pelvis.  General lab work-up was initiated as well to evaluate for infectious process.  However upon review of the head CT it was apparent that patient had head bleed in the cerebellum.  Radiology called me in the phone to make me aware of these findings as patient does have intraparenchymal hemorrhage within the cerebellar hemisphere with some mass-effect upon the fourth ventricle.  No downward herniation.  I talked with Dr. Wilford CornerArora with neurology immediately who came down to the ED.  Patient had Coumadin reversed with Kcentra and vitamin K.  Patient also was given labetalol for hypertension and started on Cleviprex drip to maintain blood pressure less than 140 systolic.  Concern for possible aspiration and will get a chest x-ray.  CT scan abdomen and pelvis is unremarkable.  Lab work otherwise unremarkable with no significant leukocytosis or anemia.  Daughter was able to provide more history as patient started to have some generalized weakness, dizziness, headache, nausea and vomiting earlier today.  Head bleed likely the cause of the symptoms.  Patient to be  admitted to the neurological ICU for further care.  Hemodynamically stable throughout my care.  This chart was dictated using voice recognition software.  Despite best efforts to proofread,  errors can occur which can change the documentation meaning.    Final Clinical Impression(s) / ED Diagnoses Final diagnoses:  Intracranial hemorrhage [facility](HCC)    Rx / DC Orders ED Discharge Orders    None       Virgina NorfolkCuratolo, Zaila Crew, DO 08/24/20 2133

## 2020-08-25 ENCOUNTER — Inpatient Hospital Stay (HOSPITAL_COMMUNITY): Payer: Medicare Other

## 2020-08-25 DIAGNOSIS — E7800 Pure hypercholesterolemia, unspecified: Secondary | ICD-10-CM

## 2020-08-25 DIAGNOSIS — I10 Essential (primary) hypertension: Secondary | ICD-10-CM

## 2020-08-25 DIAGNOSIS — I4819 Other persistent atrial fibrillation: Secondary | ICD-10-CM

## 2020-08-25 DIAGNOSIS — Z952 Presence of prosthetic heart valve: Secondary | ICD-10-CM

## 2020-08-25 LAB — URINALYSIS, ROUTINE W REFLEX MICROSCOPIC
Bilirubin Urine: NEGATIVE
Glucose, UA: 150 mg/dL — AB
Hgb urine dipstick: NEGATIVE
Ketones, ur: NEGATIVE mg/dL
Leukocytes,Ua: NEGATIVE
Nitrite: NEGATIVE
Protein, ur: NEGATIVE mg/dL
Specific Gravity, Urine: 1.025 (ref 1.005–1.030)
pH: 6 (ref 5.0–8.0)

## 2020-08-25 LAB — MRSA PCR SCREENING: MRSA by PCR: NEGATIVE

## 2020-08-25 LAB — PROTIME-INR
INR: 1.1 (ref 0.8–1.2)
INR: 1.1 (ref 0.8–1.2)
Prothrombin Time: 13.8 seconds (ref 11.4–15.2)
Prothrombin Time: 13.8 seconds (ref 11.4–15.2)

## 2020-08-25 LAB — GLUCOSE, CAPILLARY
Glucose-Capillary: 331 mg/dL — ABNORMAL HIGH (ref 70–99)
Glucose-Capillary: 230 mg/dL — ABNORMAL HIGH (ref 70–99)
Glucose-Capillary: 149 mg/dL — ABNORMAL HIGH (ref 70–99)

## 2020-08-25 LAB — RESP PANEL BY RT-PCR (FLU A&B, COVID) ARPGX2
Influenza A by PCR: NEGATIVE
Influenza B by PCR: NEGATIVE
SARS Coronavirus 2 by RT PCR: NEGATIVE

## 2020-08-25 MED ORDER — CHLORHEXIDINE GLUCONATE CLOTH 2 % EX PADS
6.0000 | MEDICATED_PAD | Freq: Every day | CUTANEOUS | Status: DC
  Administered 2020-08-25 – 2020-09-01 (×6): 6 via TOPICAL

## 2020-08-25 MED ORDER — METOPROLOL SUCCINATE ER 50 MG PO TB24
50.0000 mg | ORAL_TABLET | Freq: Every day | ORAL | Status: DC
  Administered 2020-08-25 – 2020-09-02 (×9): 50 mg via ORAL
  Filled 2020-08-25 (×9): qty 1

## 2020-08-25 MED ORDER — LABETALOL HCL 5 MG/ML IV SOLN
5.0000 mg | INTRAVENOUS | Status: DC | PRN
  Administered 2020-08-26 – 2020-08-27 (×3): 20 mg via INTRAVENOUS
  Filled 2020-08-25 (×5): qty 4

## 2020-08-25 MED ORDER — ISOSORBIDE MONONITRATE ER 30 MG PO TB24
30.0000 mg | ORAL_TABLET | Freq: Every day | ORAL | Status: DC
  Administered 2020-08-25 – 2020-09-02 (×9): 30 mg via ORAL
  Filled 2020-08-25 (×9): qty 1

## 2020-08-25 MED ORDER — IOHEXOL 350 MG/ML SOLN
75.0000 mL | Freq: Once | INTRAVENOUS | Status: AC | PRN
  Administered 2020-08-25: 75 mL via INTRAVENOUS

## 2020-08-25 MED ORDER — SODIUM CHLORIDE 0.9 % IV SOLN: INTRAVENOUS | Status: DC

## 2020-08-25 MED ORDER — INSULIN ASPART 100 UNIT/ML ~~LOC~~ SOLN
0.0000 [IU] | Freq: Three times a day (TID) | SUBCUTANEOUS | Status: DC
  Administered 2020-08-25: 13:00:00 7 [IU] via SUBCUTANEOUS
  Administered 2020-08-25: 17:00:00 3 [IU] via SUBCUTANEOUS
  Administered 2020-08-26 – 2020-08-27 (×3): 2 [IU] via SUBCUTANEOUS
  Administered 2020-08-27: 3 [IU] via SUBCUTANEOUS
  Administered 2020-08-27: 1 [IU] via SUBCUTANEOUS
  Administered 2020-08-28: 3 [IU] via SUBCUTANEOUS
  Administered 2020-08-29: 2 [IU] via SUBCUTANEOUS
  Administered 2020-08-29: 3 [IU] via SUBCUTANEOUS
  Administered 2020-08-29: 2 [IU] via SUBCUTANEOUS
  Administered 2020-08-30: 5 [IU] via SUBCUTANEOUS

## 2020-08-25 MED ORDER — MECLIZINE HCL 12.5 MG PO TABS
25.0000 mg | ORAL_TABLET | Freq: Three times a day (TID) | ORAL | Status: DC | PRN
  Administered 2020-08-25 – 2020-09-02 (×17): 25 mg via ORAL
  Filled 2020-08-25: qty 1
  Filled 2020-08-25: qty 2
  Filled 2020-08-25 (×16): qty 1
  Filled 2020-08-25: qty 2
  Filled 2020-08-25 (×2): qty 1

## 2020-08-25 MED ORDER — ONDANSETRON HCL 4 MG/2ML IJ SOLN
4.0000 mg | Freq: Four times a day (QID) | INTRAMUSCULAR | Status: DC | PRN
  Administered 2020-08-25 – 2020-09-01 (×7): 4 mg via INTRAVENOUS
  Filled 2020-08-25 (×7): qty 2

## 2020-08-25 NOTE — H&P (Signed)
Stroke neurology-admission history and physical Intracerebral hemorrhage   CC: Headache, nausea vomiting  History is obtained from: Patient, chart, patient's daughter at bedside  HPI: Deborah [name] is a 69 y.o. female past medical history of chronic atrial fibrillation, rheumatic heart disease status post mitral valve replacement and tricuspid valve replacement currently on Coumadin supratherapeutic 2 days ago, hypertension, hyperlipidemia, obesity, presented to the emergency room for sudden onset of headache this afternoon after having had some mild headache for at least 1 to 2 days.  The daughter reports that she had been complaining of some mild headache yesterday but it was sometime this afternoon that her headache become really worse and she was also more drowsy.  She also started complaining of nausea and started vomiting.  She vomited multiple times.  EMS was called.  They brought her in as an abdominal pain-nausea/vomiting patient.  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 Neurology was admitted for further evaluation, and management and admission.  The daughter reports that her blood pressures usually are well controlled.  She is independent at baseline.  Lives with family.  Currently retired.  Has received [employer] COVID-19 vaccine few months ago.  Not received booster yet.   LKW: 1 to 2 days ago tpa given?: no, ICH Premorbid modified Rankin scale (mRS): 0 ICH score-1-for infratentorial origin  ROS: Obtained and negative except as noted in HPI  Past Medical History:  Diagnosis Date  . Allergic rhinitis   . Anemia   . Borderline diabetes   . Carpal tunnel syndrome   . Chronic atrial fibrillation (HCC)     coumadin managed by [facility] CV[facility];  Event montor (7/12-8/12) showed no high rate episodes (patient continuously in atrial fibrillation).   . Chronic diastolic heart failure (HCC)   . Eosinophilia 01/27/2013  . External hemorrhoid   . External hemorrhoids   . Fatigue   . Gout   . 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 Ha  . Hx of cardiovascular stress test    ETT-myoview (7/12): No evidence for ischemia or infarction  . Hyperlipidemia   . Hypertension   . Obesity   . Rheumatic heart disease    Status post mechanical ([employer] Jude) mitral valve replacement and tricuspid valve repair at [facility] in 2002; echo 5/12:   EF 60-65%, mild AI, mitral valve prosthesis with AVA 1.66, severe LAE, moderate RAE, moderate to severe TR, mild increased pulmonary artery systolic pressure;    Adenosine Cardiolite in 4/09:   No ischemia, EF 66%  . Rotator cuff syndrome    Family History  Problem Relation Age of Onset  . Stroke Mother   . Diabetes Mother   . Heart failure Mother        pacemaker  . Heart disease Father        "heart problems"  . Heart disease Sister   . Heart disease Brother   . Gout Brother   . Diabetes Brother   . Stroke Brother   . Valvular heart disease Sister     Social History:   reports that she has never smoked. She has never used smokeless tobacco. She reports that she does not drink alcohol   and does not use drugs.  Medications  Current Facility-Administered Medications:  .  ondansetron (ZOFRAN) 4 MG/2ML injection, , , ,  .  fentaNYL (SUBLIMAZE) injection 50 mcg, 50 mcg, Intravenous, Once, Curatolo, Adam, DO .  phytonadione (VITAMIN K) 10 mg in dextrose 5 % 50 mL IVPB, 10 mg, Intravenous, STAT, Curatolo, Adam, DO .  prothrombin complex conc human (KCENTRA) IVPB 1,586 Units, 1,586 Units, Intravenous, STAT, Curatolo, Adam, DO  Current Outpatient Medications:  .  ACCU-CHEK SOFTCLIX LANCETS lancets, USE AS DIRECTED TO test  blood sugar, Disp: 100 each, Rfl: 6 .  acetaminophen (TYLENOL) 500 MG tablet, Take 500-1,000 mg by mouth every 6 (six) hours as needed for fever (pain)., Disp: , Rfl:  .  allopurinol (ZYLOPRIM) 300 MG tablet, TAKE 1 TABLET BY MOUTH EVERY DAY, Disp: 90 tablet, Rfl: 3 .  aspirin 81 MG chewable tablet, Chew 81 mg by mouth daily., Disp: , Rfl:  .  cetirizine (ZYRTEC) 10 MG tablet, Take 10 mg by mouth daily., Disp: , Rfl:  .  cyanocobalamin 500 MCG tablet, Take 1 tablet (500 mcg total) by mouth daily., Disp: 30 tablet, Rfl: 5 .  cycloSPORINE (RESTASIS) 0.05 % ophthalmic emulsion, Place 1 drop into both eyes daily as needed. , Disp: , Rfl:  .  diazepam (VALIUM) 5 MG tablet, Take 5 mg by mouth every 8 (eight) hours as needed for anxiety. , Disp: , Rfl:  .  diclofenac Sodium (VOLTAREN) 1 % GEL, APPLY 2 GRAMS TO AFFECTED AREA 4 TIMES A DAY, Disp: 100 g, Rfl: 3 .  famotidine (PEPCID) 20 MG tablet, Take 20 mg by mouth daily., Disp: , Rfl:  .  folic acid (FOLVITE) 1 MG tablet, TAKE 1 TABLET BY MOUTH EVERY DAY, Disp: 90 tablet, Rfl: 3 .  glucose blood (ACCU-CHEK AVIVA PLUS) test strip, 1 each by Other route as needed for other. Use as needed for symptoms of low or high blood sugars, Disp: 100 strip, Rfl: 12 .  isosorbide mononitrate (IMDUR) 30 MG 24 hr tablet, Take 30 mg by mouth daily., Disp: , Rfl:  .  meclizine (ANTIVERT) 25 MG tablet, TAKE 1 TABLET (25 MG TOTAL) BY MOUTH 3 (THREE) TIMES DAILY AS NEEDED FOR DIZZINESS., Disp: 30 tablet, Rfl: 0 .  Menthol-Methyl Salicylate (TIGER BALM LINIMENT EX), Apply 1 application topically daily., Disp: , Rfl:  .  metFORMIN (GLUCOPHAGE) 500 MG tablet, TAKE 1 TABLET (500 MG TOTAL) BY MOUTH 2 (TWO) TIMES DAILY WITH A MEAL., Disp: 180 tablet, Rfl: 1 .  metoprolol succinate (TOPROL-XL) 50 MG 24 hr tablet, TAKE 1 TABLET BY MOUTH EVERY DAY, Disp: 90 tablet, Rfl: 3 .  nitroGLYCERIN (NITROSTAT) 0.4 MG SL tablet, Place 0.4 mg under the tongue every 5 (five) minutes as needed for  chest pain., Disp: , Rfl:  .  Olopatadine HCl (PAZEO) 0.7 % SOLN, Place 1 drop into both eyes daily as needed (itching/ irritation)., Disp: , Rfl:  .  omeprazole (PRILOSEC) 20 MG capsule, TAKE 1 CAPSULE 2 (TWO) TIMES DAILY BEFORE A MEAL. TAKE WITH OMEPRAZOLE 40 MG TO EQUAL 60 MG, Disp: 180 capsule, Rfl: 3 .  potassium chloride SA (KLOR-CON M20) 20 MEQ tablet, Take 3 tablets (60 mEq total) by mouth 2 (two) times daily., Disp: 540 tablet, Rfl: 2 .  rosuvastatin (CRESTOR) 10 MG tablet, TAKE 1 TABLET BY MOUTH EVERY DAY, Disp: 90 tablet, Rfl: 3 .  torsemide (DEMADEX) 20 MG tablet, TAKE 4 TABLETS (80 MG TOTAL) EVERY MORNING AND 3 TABLETS (60 MG  TOTAL) EVERY EVENING., Disp: 630 tablet, Rfl: 1 .  warfarin (COUMADIN) 2 MG tablet, TAKE 1/2-1 TABLET BY MOUTH EVERY DAY OR AS DIRECTED BY COUMADIN CLINIC, Disp: 75 tablet, Rfl: 1 .  zinc sulfate 220 (50 Zn) MG capsule, Take 220 mg by mouth daily., Disp: , Rfl:    Allergies: Phenergan-severe agitation, altered mental status.  Exam: Current vital signs: BP 107/65   Pulse 75   Temp 97.8 F (36.6 C) (Rectal)   Resp (!) 27   Ht 4\' 11"  (1.499 m)   Wt 67.8 kg   SpO2 99%   BMI 30.19 kg/m  Vital signs in last 24 hours: Temp:  [97.8 F (36.6 C)] 97.8 F (36.6 C) (12/29 2019) Pulse Rate:  [74-76] 75 (12/29 2130) Resp:  [25-30] 27 (12/29 2130) BP: (107-175)/(65-84) 107/65 (12/29 2130) SpO2:  [94 %-99 %] 99 % (12/29 2130) Weight:  [67.8 kg] 67.8 kg (12/29 1955) General: Patient is drowsy, opens eyes to voice, appears uncomfortable due to nausea. HEENT: Normocephalic, atraumatic, dry vomitus on her oral cavity. CVS: Irregularly irregular in A. fib on the monitor. Respiratory: Chest appears clear to auscultation Extremities: Warm well perfused with some bruises on the leg Abdomen nondistended nontender. Neurological exam Drowsy but opens eyes to voice. Mildly dysarthric Poor attention concentration No evidence of aphasia Follows commands. Is able to  answer simple questions in [place]. Was able to tell me her correct age and the month. Cranial: Pupils equal round reactive light, extraocular movements appear intact with the limitation being that every time she opens her eyes for any extended period of time, she is more nauseous and she closes them immediately.  Facial symmetry is preserved.  Facial sensation is preserved.  Tongue and palate are midline. Motor exam: She has generalized weakness in all 4 extremities and is unable to keep bilateral upper extremities above antigravity and they hit the bed before 10 seconds.  Both legs also go antigravity but hit the head before 5 seconds. Sensory exam: Intact to touch Coordination: Very difficult to perform given her mentation and poor attention concentration. NIH stroke scale 1a Level of Conscious.: 1 1b LOC Questions: 0 1c LOC Commands:0  2 Best Gaze: 0 3 Visual: 0 4 Facial Palsy:0  5a Motor Arm - left: 2 5b Motor Arm - Right:2  6a Motor Leg - Left: 2 6b Motor Leg - Right:2  7 Limb Ataxia: 0 8 Sensory: 0 9 Best Language: 0 10 Dysarthria: 1 11 Extinct. and Inatten.:0  TOTAL: 10  Labs I have reviewed labs in epic and the results pertinent to this consultation are: No leukocytosis.  Anemia with hemoglobin 11.  MCV 76.7.  Platelet count 201. CBC    Component Value Date/Time   WBC 10.1 08/24/2020 2003   RBC 4.73 08/24/2020 2003   HGB 11.0 (L) 08/24/2020 2003   HGB 11.1 07/15/2018 1506   HGB 11.8 07/15/2017 0919   HCT 36.3 08/24/2020 2003   HCT 36.4 07/15/2018 1506   HCT 37.4 07/15/2017 0919   PLT 201 08/24/2020 2003   PLT 218 07/15/2018 1506   MCV 76.7 (L) 08/24/2020 2003   MCV 77 (L) 07/15/2018 1506   MCV 74.9 (L) 07/15/2017 0919   MCH 23.3 (L) 08/24/2020 2003   MCHC 30.3 08/24/2020 2003   RDW 17.7 (H) 08/24/2020 2003   RDW 16.4 (H) 07/15/2018 1506   RDW 16.5 (H) 07/15/2017 0919   LYMPHSABS 3.9 08/24/2020 2003   LYMPHSABS 2.4 07/15/2017 0919   MONOABS 1.0 08/24/2020  2003   MONOABS 0.8 07/15/2017 0919   EOSABS 0.7 (H) 08/24/2020 2003   EOSABS 0.3 07/15/2017 0919   BASOSABS 0.1 08/24/2020 2003   BASOSABS 0.1 07/15/2017 0919    CMP pending from today.    Component Value Date/Time   NA 135 06/01/2020 1033   NA 142 07/15/2018 1506   NA 142 07/15/2017 0919   K 3.9 06/01/2020 1033   K 4.3 07/15/2017 0919   CL 99 06/01/2020 1033   CL 108 (H) 02/12/2013 0813   CO2 22 06/01/2020 1033   CO2 29 07/15/2017 0919   GLUCOSE 273 (H) 06/01/2020 1033   GLUCOSE 125 07/15/2017 0919   GLUCOSE 115 (H) 02/12/2013 0813   BUN 21 06/01/2020 1033   BUN 16 07/15/2018 1506   BUN 30.2 (H) 07/15/2017 0919   CREATININE 1.47 (H) 06/01/2020 1033   CREATININE 1.5 (H) 07/15/2017 0919   CALCIUM 9.2 06/01/2020 1033   CALCIUM 10.2 07/15/2017 0919   PROT 7.1 11/02/2019 1048   PROT 8.1 07/15/2017 0919   ALBUMIN 3.9 11/02/2019 1048   ALBUMIN 4.2 07/15/2017 0919   AST 27 11/02/2019 1048   AST 26 07/15/2017 0919   ALT 27 11/02/2019 1048   ALT 28 07/15/2017 0919   ALKPHOS 79 11/02/2019 1048   ALKPHOS 80 07/15/2017 0919   BILITOT 1.2 11/02/2019 1048   BILITOT 1.24 (H) 07/15/2017 0919   GFRNONAA 37 (L) 06/01/2020 1033   GFRNONAA 54 (L) 09/05/2016 1127   GFRAA 44 (L) 05/16/2020 1126   GFRAA 62 09/05/2016 1127   PT/INR-INR 4.3 on 08/22/2020. Lactate 2.9  Imaging I have reviewed the images obtained:  CT-scan of the brain-Focal ICH within the inter cerebellar hemisphere with mass-effect upon the fourth ventricle.  No downward herniation.  No hydrocephalus.  No extra-axial collections.  Age-related atrophy and chronic small vessel ischemia  CT abdomen and pelvis-with contrast was done due to the presentation with nausea and vomiting-which shows large amount of stool in the sigmoid and rectum.  No other abnormality seen in abdomen or pelvis.  Aortic atherosclerosis seen.  Assessment:  69 year old woman on Coumadin as above presenting with 2-day history of headache which  severely worsened with addition of nausea and vomiting this afternoon and somewhat depressed level of consciousness brought in for evaluation found to have a cerebellar ICH-likely secondary to coagulopathy from Coumadin and some contribution from hypertension-highest recorded systolic blood pressure was in the high 170s. No history of trauma making traumatic bleed less likely. Unable to emergently perform CTA head and neck for any vascular etiology as she has received contrast for CT abdomen and pelvis with contrast. We will recommend MRI with and without contrast for further evaluation Mainstay of the treatment will be blood pressure management, reversal of coagulopathy and close neuro monitoring. Surgical intervention might be needed-we will assess on ongoing basis.  Assessment:   Plan: Cerebellum ICH, nontraumatic Acuity: Acute Current suspected etiology: Coagulopathy, hypertension Treatment: -Admit to-neurological ICU under stroke neurology service. -ICH Score: 1 -BP control goal SYS<140 -PT/OT/ST  -neuromonitoring -Low threshold for consulting neurosurgery if clinical deterioration or imaging deterioration including hydrocephalus.  Currently has an open fourth ventricle with no evidence of hydrocephalus. -Repeat head scan-MRI ordered for 3 AM with and without contrast.  If unable to do MRI, will do CT scan at that time.  CNS Cerebral edema Compression of brain -NS at 75cc/h for now. Consider hypertonic if repeat CTH shows increased cerebral edema -Close neuro monitoring  Dysarthria Dysphagia following ICH  -  NPO until cleared by speech -ST  RESP Possible aspiration pneumonia-had large amounts of vomiting prior to arrival.  Also had some vomiting on arrival. -chest x-ray -Hold off antibiotics-no leukocytosis.  Will consider antibiotics if chest x-ray shows aspiration  CV Hypertensive Encephalopathy Hypertensive Emergency -Aggressive BP control, goal SBP <140 -Labetalol  and hydralazine as needed.  Cleviprex drip -TTE  Chronic atrial -fibrillation -Rate control -Hold Coumadin due to ICH. -Consult cardiology in the morning regarding management of the valve and atrial fibrillation in the setting of ICH to have them on board Had a detailed discussion with the daughter that at this time due to the ICH, we will have to hold the anticoagulation which makes her more susceptible for ischemic stroke events.  GI/GU Based on her prior labs, she has CKD 3. -Gentle hydration -avoid nephrotoxic agents -renal consult  HEME Iron Deficiency Anemia -Monitor -transfuse for hgb < 7  Coagulopathy secondary to anticoagulation -Goal INR is 1.6 or less at this time. -Reverse with PCC, vitamin K. -Trend PT/PTT/INR -Get cardiology on board for input on resumption of anticoagulation.  ENDO Type 2 diabetes mellitus with hyperglycemia  -SSI -goal HgbA1c < 7  Fluid/Electrolyte Disorders Labs pending Replete as necessary Check a.m. labs   ID Possible Aspiration PNA -CXR -NPO Discussed with EDP.  Check urinalysis  Lactic acidosis Gentle hydration and recheck lactate. If still remains elevated, blood cultures and empiric antibiotic coverage  Nutrition E66.9 Obesity  -diet consult  Prophylaxis DVT: SCDs-no antiplatelets or anticoagulants given the ICH GI: PPI Bowel: Docusate senna  Dispo: To be decided  Diet: Keep her n.p.o. overnight at least.  Do a formal swallow screen in the morning and decide on diet at that time.  Code Status: Full Code-confirmed with the daughter.   THE FOLLOWING WERE PRESENT ON ADMISSION: ICH, cerebral edema, hypertensive encephalopathy, hypertensive emergency, probable aspiration pneumonia, chronic atrial fibrillation, CKD 3, coagulopathy secondary to Coumadin   Discussed my plan in detail with Dr. Curatolo-ED provider as well as patient's daughter.  Answered all questions to the best of my ability.    -- Estus Krakowski,  MD [facility] Pager: [phone] If 7pm to 7am, please call on call as listed on AMION.     CRITICAL CARE ATTESTATION Performed by: Markel Kurtenbach, MD Total critical care time: 60 minutes Critical care time was exclusive of separately billable procedures and treating other patients and/or supervising APPs/Residents/Students Critical care was necessary to treat or prevent imminent or life-threatening deterioration due to intracerebral hemorrhage, cerebellar hemorrhage, hypertensive emergency, aspiration pneumonia This patient is critically ill and at significant risk for neurological worsening and/or death and care requires constant monitoring. Critical care was time spent personally by me on the following activities: development of treatment plan with patient and/or surrogate as well as nursing, discussions with consultants, evaluation of patient's response to treatment, examination of patient, obtaining history from patient or surrogate, ordering and performing treatments and interventions, ordering and review of laboratory studies, ordering and review of radiographic studies, pulse oximetry, re-evaluation of patient's condition, participation in multidisciplinary rounds and medical decision making of high complexity in the care of this patient.  

## 2020-08-25 NOTE — Progress Notes (Signed)
PT Cancellation Note  Patient Details Name: Alis Sawchuk MRN: [id number] DOB: 05/18/1951   Cancelled Treatment:    Reason Eval/Treat Not Completed: Medical issues which prohibited therapy;Other (comment) PT/OT attempted x2 today, both attempts were limited by severe nausea and vomiting with any movement despite attempts to medicate. PT will continue to follow and evaluate as appropriate.   Rolm Baptise, PT, DPT   Acute Rehabilitation Department Pager #: [phone]   Gaetana Michaelis 08/25/2020, 2:44 PM

## 2020-08-25 NOTE — Progress Notes (Signed)
OT Cancellation Note  Patient Details Name: Deborah [name] MRN: [id number] DOB: [birth year]   Cancelled Treatment:    Reason Eval/Treat Not Completed: Medical issues which prohibited therapy. Nauseous with movement. Attempt eval and pt turning her head to track therapist and becoming nauseous with vomiting. Pt receiving additional medication. Returned for second attempt and continued nausea. Will reattempt evaluation tomorrow as schedule allows. Thank you.  Candiace West M Jamilla Galli Amari Zagal MSOT, OTR/L Acute Rehab Pager: [phone] Office: [phone] 08/25/2020, 2:47 PM

## 2020-08-25 NOTE — Progress Notes (Signed)
I was called by the patient's RN, Marchelle Folks, to speak with the daughter regarding a few questions.  I spoke with the daughter over the phone. Her questions are as follows: 1.  Question was cardiology consult-I had a discussion with the patient's daughter yesterday while admitting the patient that we will involve cardiology as soon as appropriate because of the heart valve requiring anticoagulation and the current presentation with an ICH which precludes anticoagulation.  I reassured her that we will get cardiology involved as soon as we are clear from a ICH perspective. 2.  The daughter also is concerned that her mother has had increasing headaches that she complained of increasing headache at 4 PM as well as 7 PM.  I told her we will follow this up with imaging.  Recommendations: -Stat head CT to evaluate for any evidence of expansion of the ICH. -Continue care as per Dr. Warren Danes recommendation -Stroke team to coordinate cardiology consultation as appropriate.  Answered all the questions  -- Milon Dikes, MD [facility] Pager: [phone] If 7pm to 7am, please call on call as listed on AMION.

## 2020-08-25 NOTE — Progress Notes (Signed)
STROKE TEAM PROGRESS NOTE   INTERVAL HISTORY Her daughter is at the bedside and her son is on the phone.  Pt reclining in bed, very lethargic but able to open eyes answer questions appropriately and cooperative on exam. Still feeling nausea and dry heave. Dizziness with motion. PT/OT reported nystagmus but I did not see well due to pt continuous coughing. CXR no acute disease. Repeat CT stable bleeding. CTA head and neck neg.  Vitals:   08/25/20 0930 08/25/20 0945 08/25/20 1000 08/25/20 1015  BP: (!) 115/96 130/75 130/70   Pulse: 81 72 74 82  Resp: (!) 23 (!) 21 19 (!) 25  Temp:      TempSrc:      SpO2: 94% 98% 98% 94%  Weight:      Height:       CBC:  Recent Labs  Lab 08/24/20 2003 08/24/20 2158  WBC 10.1  --   NEUTROABS 4.4  --   HGB 11.0* 12.6  HCT 36.3 37.0  MCV 76.7*  --   PLT 201  --    Basic Metabolic Panel:  Recent Labs  Lab 08/24/20 2115 08/24/20 2158  NA 137 139  K 4.3 4.7  CL 103 103  CO2 24  --   GLUCOSE 188* 223*  BUN 29* 40*  CREATININE 1.45* 1.30*  CALCIUM 8.1*  --    Lipid Panel:  Recent Labs  Lab 08/24/20 2115  CHOL 150  TRIG 170*  HDL 45  CHOLHDL 3.3  VLDL 34  LDLCALC 71   HgbA1c:  Recent Labs  Lab 08/24/20 2154  HGBA1C 7.5*   Urine Drug Screen: No results for input(s): LABOPIA, COCAINSCRNUR, LABBENZ, AMPHETMU, THCU, LABBARB in the last 168 hours.  Alcohol Level No results for input(s): ETH in the last 168 hours.  IMAGING past 24 hours CT ANGIO HEAD W OR WO CONTRAST  Result Date: 08/25/2020 CLINICAL DATA:  Stroke/TIA, assess intracranial arteries EXAM: CT ANGIOGRAPHY HEAD AND NECK TECHNIQUE: Multidetector CT imaging of the head and neck was performed using the standard protocol during bolus administration of intravenous contrast. Multiplanar CT image reconstructions and MIPs were obtained to evaluate the vascular anatomy. Carotid stenosis measurements (when applicable) are obtained utilizing NASCET criteria, using the distal internal  carotid diameter as the denominator. CONTRAST:  19mL OMNIPAQUE IOHEXOL 350 MG/ML SOLN COMPARISON:  08/24/2020 and prior.  Concurrent noncontrast head CT. FINDINGS: CTA NECK FINDINGS Aortic arch: Standard branching. Imaged portion shows no evidence of aneurysm or dissection. No significant stenosis of the major arch vessel origins. Minimal atherosclerotic calcifications. Right carotid system: No evidence of dissection, stenosis (50% or greater) or occlusion. Left carotid system: No evidence of dissection, stenosis (50% or greater) or occlusion. Vertebral arteries: Dominant right vertebral artery. No evidence of dissection, stenosis (50% or greater) or occlusion. Skeleton: No acute or suspicious osseous abnormalities. Post sternotomy sequela. Other neck: No adenopathy.  No soft tissue mass. Upper chest: Upper lung atelectasis.  No acute finding. Review of the MIP images confirms the above findings CTA HEAD FINDINGS Anterior circulation: No significant stenosis, proximal occlusion, aneurysm, or vascular malformation. Bilateral carotid siphon atherosclerotic calcifications. Posterior circulation: No significant stenosis, proximal occlusion, aneurysm, or vascular malformation. Venous sinuses: As permitted by contrast timing, patent. Anatomic variants: None. Review of the MIP images confirms the above findings IMPRESSION: No large vessel occlusion, dissection or aneurysm. No high-grade narrowing of the major intracranial or neck vessels. Cerebellar vermis hemorrhage is better evaluated on concurrent head CT. Electronically Signed  By: Stana Buntinghikanele  Emekauwa M.D.   On: 08/25/2020 09:30   CT HEAD WO CONTRAST  Result Date: 08/25/2020 CLINICAL DATA:  Cerebral hemorrhage EXAM: CT HEAD WITHOUT CONTRAST TECHNIQUE: Contiguous axial images were obtained from the base of the skull through the vertex without intravenous contrast. COMPARISON:  August 24, 2020 head CT and brain MRI FINDINGS: Brain: Focal hemorrhage in the midline  cerebellar region measures 2.0 x 1.5 x 1.6 cm, stable by remeasurement, with slight surrounding edema, stable. There is no new focal hemorrhage. Slight impression on the posterior aspect of the fourth ventricle is stable. There is no fourth ventricular effacement. Elsewhere there is mild diffuse atrophy, stable. No extra-axial fluid collection or midline shift. Mild small vessel disease in the centra semiovale bilaterally is stable. Vascular: No hyperdense vessel evident. Calcification noted in each carotid siphon region. Skull: Bony calvarium appears intact. Sinuses/Orbits: Visualized paranasal sinuses are clear. Orbits appear symmetric bilaterally. Other: Mastoid air cells are clear. IMPRESSION: Stable hemorrhage posterior to the fourth ventricle in the mid cerebellar region with slight impression on the posterior aspect of the fourth ventricle. No ventricular effacement. No new hemorrhage. Elsewhere there is mild atrophy with mild periventricular small vessel disease. No new focus of decreased attenuation evident. Foci of arterial vascular calcification noted. Electronically Signed   By: Bretta BangWilliam  Woodruff III M.D.   On: 08/25/2020 09:14   CT Head Wo Contrast  Result Date: 08/24/2020 CLINICAL DATA:  Mental status change EXAM: CT HEAD WITHOUT CONTRAST TECHNIQUE: Contiguous axial images were obtained from the base of the skull through the vertex without intravenous contrast. COMPARISON:  None. FINDINGS: Brain: Rounded intraparenchymal hemorrhage seen within the inter cerebellar hemisphere measuring 1.6 x 1.4 x 1.8 cm. There is mild surrounding mass effect upon the fourth ventricle. No downward herniation is seen. No extra-axial collections or other intraparenchymal hemorrhage is seen. There is mild dilatation the ventricles and sulci consistent with age-related atrophy. Low-attenuation changes in the deep white matter consistent with small vessel ischemia. Vascular: No hyperdense vessel or unexpected  calcification. Skull: The skull is intact. No fracture or focal lesion identified. Sinuses/Orbits: The visualized paranasal sinuses and mastoid air cells are clear. The orbits and globes intact. Other: None IMPRESSION: Focal intraparenchymal hemorrhage within the inter cerebellar hemisphere with mass effect upon the fourth ventricle. No downward herniation. No extra-axial collections. Findings consistent with mild age related atrophy and chronic small vessel ischemia These results were called by telephone at the time of interpretation on 08/24/2020 at 8:56 pm to provider ADAM CURATOLO , who verbally acknowledged these results. Electronically Signed   By: Jonna ClarkBindu  Avutu M.D.   On: 08/24/2020 20:56   CT ANGIO NECK W OR WO CONTRAST  Result Date: 08/25/2020 CLINICAL DATA:  Stroke/TIA, assess intracranial arteries EXAM: CT ANGIOGRAPHY HEAD AND NECK TECHNIQUE: Multidetector CT imaging of the head and neck was performed using the standard protocol during bolus administration of intravenous contrast. Multiplanar CT image reconstructions and MIPs were obtained to evaluate the vascular anatomy. Carotid stenosis measurements (when applicable) are obtained utilizing NASCET criteria, using the distal internal carotid diameter as the denominator. CONTRAST:  75mL OMNIPAQUE IOHEXOL 350 MG/ML SOLN COMPARISON:  08/24/2020 and prior.  Concurrent noncontrast head CT. FINDINGS: CTA NECK FINDINGS Aortic arch: Standard branching. Imaged portion shows no evidence of aneurysm or dissection. No significant stenosis of the major arch vessel origins. Minimal atherosclerotic calcifications. Right carotid system: No evidence of dissection, stenosis (50% or greater) or occlusion. Left carotid system: No evidence of dissection, stenosis (  50% or greater) or occlusion. Vertebral arteries: Dominant right vertebral artery. No evidence of dissection, stenosis (50% or greater) or occlusion. Skeleton: No acute or suspicious osseous abnormalities. Post  sternotomy sequela. Other neck: No adenopathy.  No soft tissue mass. Upper chest: Upper lung atelectasis.  No acute finding. Review of the MIP images confirms the above findings CTA HEAD FINDINGS Anterior circulation: No significant stenosis, proximal occlusion, aneurysm, or vascular malformation. Bilateral carotid siphon atherosclerotic calcifications. Posterior circulation: No significant stenosis, proximal occlusion, aneurysm, or vascular malformation. Venous sinuses: As permitted by contrast timing, patent. Anatomic variants: None. Review of the MIP images confirms the above findings IMPRESSION: No large vessel occlusion, dissection or aneurysm. No high-grade narrowing of the major intracranial or neck vessels. Cerebellar vermis hemorrhage is better evaluated on concurrent head CT. Electronically Signed   By: Stana Bunting M.D.   On: 08/25/2020 09:30   MR BRAIN W WO CONTRAST  Result Date: 08/24/2020 CLINICAL DATA:  Acute neurologic deficit EXAM: MRI HEAD WITHOUT AND WITH CONTRAST TECHNIQUE: Multiplanar, multiecho pulse sequences of the brain and surrounding structures were obtained without and with intravenous contrast. CONTRAST:  6.92mL GADAVIST GADOBUTROL 1 MMOL/ML IV SOLN COMPARISON:  Head CT 08/24/2020 FINDINGS: Brain: No acute infarct, mass effect or extra-axial collection. Unchanged appearance of hemorrhage within the cerebellar vermis. There is multifocal hyperintense T2-weighted signal within the white matter. Parenchymal volume and CSF spaces are normal. There is no abnormal contrast enhancement. Vascular: Major flow voids are preserved. Skull and upper cervical spine: Normal calvarium and skull base. Visualized upper cervical spine and soft tissues are normal. Sinuses/Orbits:No paranasal sinus fluid levels or advanced mucosal thickening. No mastoid or middle ear effusion. Normal orbits. IMPRESSION: 1. Unchanged appearance of cerebellar vermis hemorrhage. 2. No acute infarct or enhancing mass  lesion. Electronically Signed   By: Deatra Robinson M.D.   On: 08/24/2020 23:01   CT ABDOMEN PELVIS W CONTRAST  Result Date: 08/24/2020 CLINICAL DATA:  Epigastric abdominal pain. EXAM: CT ABDOMEN AND PELVIS WITH CONTRAST TECHNIQUE: Multidetector CT imaging of the abdomen and pelvis was performed using the standard protocol following bolus administration of intravenous contrast. CONTRAST:  OMNIPAQUE IOHEXOL 300 MG/ML  SOLN COMPARISON:  None. FINDINGS: Lower chest: No acute abnormality. Hepatobiliary: No focal liver abnormality is seen. No gallstones, gallbladder wall thickening, or biliary dilatation. Pancreas: Unremarkable. No pancreatic ductal dilatation or surrounding inflammatory changes. Spleen: Normal in size without focal abnormality. Adrenals/Urinary Tract: Adrenal glands are unremarkable. Kidneys are normal, without renal calculi, focal lesion, or hydronephrosis. Bladder is unremarkable. Stomach/Bowel: Stomach appears normal. There is no evidence of bowel obstruction or inflammation. The appendix appears normal. Large amount of stool is seen in the sigmoid colon and rectum. Vascular/Lymphatic: Aortic atherosclerosis. No enlarged abdominal or pelvic lymph nodes. Reproductive: Uterus and bilateral adnexa are unremarkable. Other: No abdominal wall hernia or abnormality. No abdominopelvic ascites. Musculoskeletal: No acute or significant osseous findings. IMPRESSION: 1. Aortic atherosclerosis. 2. Large amount of stool is seen in the sigmoid colon and rectum. 3. No other abnormality seen in the abdomen or pelvis. Aortic Atherosclerosis (ICD10-I70.0). Electronically Signed   By: Lupita Raider M.D.   On: 08/24/2020 20:53   DG CHEST PORT 1 VIEW  Result Date: 08/25/2020 CLINICAL DATA:  Aspiration. EXAM: PORTABLE CHEST 1 VIEW COMPARISON:  08/24/2020, 07/15/2018 FINDINGS: No focal consolidation. No pleural effusion or pneumothorax. Stable cardiomegaly. Prior median sternotomy. No acute osseous  abnormality. IMPRESSION: No acute cardiopulmonary disease. Electronically Signed   By: Elige Ko  On: 08/25/2020 09:45   DG Chest Portable 1 View  Result Date: 08/24/2020 CLINICAL DATA:  Altered mental status. EXAM: PORTABLE CHEST 1 VIEW COMPARISON:  April 07, 2019. FINDINGS: Stable cardiomegaly. No pneumothorax or pleural effusion is noted. Sternotomy wires are noted. Lungs are clear. Bony thorax is unremarkable. IMPRESSION: No active disease. Electronically Signed   By: Lupita Raider M.D.   On: 08/24/2020 20:36    PHYSICAL EXAM  Temp:  [97.8 F (36.6 C)-97.9 F (36.6 C)] 97.9 F (36.6 C) (12/30 0400) Pulse Rate:  [61-98] 82 (12/30 1015) Resp:  [15-32] 25 (12/30 1015) BP: (105-175)/(55-96) 130/70 (12/30 1000) SpO2:  [91 %-100 %] 94 % (12/30 1015) Weight:  [67.8 kg-69.2 kg] 69.2 kg (12/30 0305)  General - Well nourished, well developed, very lethargic.  Ophthalmologic - fundi not visualized due to noncooperation.  Cardiovascular - irregularly irregular heart rate and rhythm.  Neuro - very lethargic, eyes closed but easily open with voice. Fully orientated and no aphasia, able to name and repeat, follows all simple commands. However, intermittent coughing and dry heave. Visual field full, no gaze palsy, no significant nystagmus seen on my exam. Facial symmetrical, tongue midline. PERRL. Moving b/l against gravity symmetrical, moving b/l LEs 3/5 proximal and 4/5 distally. Sensation symmetrical. Coordination not cooperative due to lethargy. Gait not tested.    ASSESSMENT/PLAN Deborah [name] is a 69 y.o. 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  Therapy recommendations:  pending  Disposition:  pending  Chronic afib Mechanical MV TV repair  On warfarin at home  INR 4.3->2.1->kcentra and vitK->1.1  Off AC at this time  May consider heparin drip once pt stablized from ICH standpoint to prevent for em  Pt does have high risk of embolic event with mechanical valve at this time - discussed with daughter and son  Hypertension  Home meds:  Imdur, metoprolol, torsemide  Stable  On low dose cleviprex - wean off as able . Resumed imdur and metoprolol . Long-term BP goal normotensive  Hyperlipidemia  Home meds:  crestor 10  LDL 71, goal < 70  Hold off crestor now due to ICH  Consider to resume statin at discharge  Diabetes type II Uncontrolled  Home meds:  metformin  HgbA1c 7.5, goal < 7.0  CBGs   SSI  PCP close follow up  Other Stroke Risk Factors  Advanced Age >/= 65   Obesity, Body mass index is 30.81 kg/m., BMI >/= 30 associated with increased stroke risk, recommend weight loss, diet and exercise as appropriate   Other Active Problems    Hospital day # 1   This patient is critically ill due to cerebellar ICH, hx of afib and mechanical valve on coumadin, hypertension and at significant risk of neurological worsening, death form hematoma expansion, brain herniation, hydrocephalus, heart failure, new embolic event, seizure. This patient's care requires constant monitoring of vital signs, hemodynamics, respiratory and cardiac monitoring, review of multiple databases, neurological assessment, discussion with  family, other specialists and medical decision making of high complexity. I spent 55 minutes of neurocritical care time in the care of this patient. I had long discussion with daughter at bedside and son over the phone,  updated pt current condition, treatment plan and potential prognosis, and answered all the questions. They expressed understanding and appreciation.    Marvel Plan, MD PhD Stroke Neurology 08/25/2020 11:48 AM    To contact Stroke Continuity provider, please refer to [email or web address]. After hours, contact General Neurology

## 2020-08-25 NOTE — Progress Notes (Signed)
MRI brain with and without contrast completed.  Unchanged appearance of the bleed in comparison to the CT. Although not ideally a 6 or 8-hour follow-up.  Bleed remained stable. INR also normalized from 2.1-1.1. Will follow closely. We will obtain another head CT tomorrow morning.  -- Milon Dikes, MD [facility]

## 2020-08-26 ENCOUNTER — Encounter (HOSPITAL_COMMUNITY): Payer: Self-pay

## 2020-08-26 ENCOUNTER — Inpatient Hospital Stay (HOSPITAL_COMMUNITY): Payer: Medicare Other

## 2020-08-26 DIAGNOSIS — I161 Hypertensive emergency: Secondary | ICD-10-CM

## 2020-08-26 DIAGNOSIS — Z7901 Long term (current) use of anticoagulants: Secondary | ICD-10-CM

## 2020-08-26 DIAGNOSIS — I6389 Other cerebral infarction: Secondary | ICD-10-CM

## 2020-08-26 LAB — ECHOCARDIOGRAM COMPLETE
AR max vel: 1.12 cm2
AV Peak grad: 13 mmHg
Ao pk vel: 1.8 m/s
Area-P 1/2: 3.91 cm2
Height: 59 in
P 1/2 time: 352 msec
S' Lateral: 2.6 cm
Weight: 2440.93 oz

## 2020-08-26 LAB — GLUCOSE, CAPILLARY
Glucose-Capillary: 174 mg/dL — ABNORMAL HIGH (ref 70–99)
Glucose-Capillary: 173 mg/dL — ABNORMAL HIGH (ref 70–99)
Glucose-Capillary: 173 mg/dL — ABNORMAL HIGH (ref 70–99)
Glucose-Capillary: 218 mg/dL — ABNORMAL HIGH (ref 70–99)

## 2020-08-26 LAB — BASIC METABOLIC PANEL
Anion gap: 12 (ref 5–15)
BUN: 23 mg/dL (ref 8–23)
CO2: 23 mmol/L (ref 22–32)
Calcium: 8.1 mg/dL — ABNORMAL LOW (ref 8.9–10.3)
Chloride: 106 mmol/L (ref 98–111)
Creatinine, Ser: 1.29 mg/dL — ABNORMAL HIGH (ref 0.44–1.00)
GFR, Estimated: 45 mL/min — ABNORMAL LOW (ref >60)
Glucose, Bld: 179 mg/dL — ABNORMAL HIGH (ref 70–99)
Potassium: 3.5 mmol/L (ref 3.5–5.1)
Sodium: 141 mmol/L (ref 135–145)

## 2020-08-26 LAB — CBC
HCT: 32.1 % — ABNORMAL LOW (ref 36.0–46.0)
Hemoglobin: 9.7 g/dL — ABNORMAL LOW (ref 12.0–15.0)
MCH: 23 pg — ABNORMAL LOW (ref 26.0–34.0)
MCHC: 30.2 g/dL (ref 30.0–36.0)
MCV: 76.2 fL — ABNORMAL LOW (ref 80.0–100.0)
Platelets: 168 10*3/uL (ref 150–400)
RBC: 4.21 MIL/uL (ref 3.87–5.11)
RDW: 16 % — ABNORMAL HIGH (ref 11.5–15.5)
WBC: 9.1 10*3/uL (ref 4.0–10.5)
nRBC: 0 % (ref 0.0–0.2)

## 2020-08-26 LAB — PROTIME-INR
INR: 1.1 (ref 0.8–1.2)
Prothrombin Time: 13.5 seconds (ref 11.4–15.2)

## 2020-08-26 MED ORDER — TORSEMIDE 20 MG PO TABS
80.0000 mg | ORAL_TABLET | Freq: Every morning | ORAL | Status: DC
  Administered 2020-08-26 – 2020-09-02 (×8): 80 mg via ORAL
  Filled 2020-08-26 (×8): qty 4

## 2020-08-26 MED ORDER — TORSEMIDE 20 MG PO TABS
60.0000 mg | ORAL_TABLET | Freq: Two times a day (BID) | ORAL | Status: DC
  Filled 2020-08-26: qty 3

## 2020-08-26 MED ORDER — TORSEMIDE 20 MG PO TABS
60.0000 mg | ORAL_TABLET | Freq: Every day | ORAL | Status: DC
  Administered 2020-08-26 – 2020-09-01 (×7): 60 mg via ORAL
  Filled 2020-08-26 (×8): qty 3

## 2020-08-26 MED ORDER — POTASSIUM CHLORIDE CRYS ER 20 MEQ PO TBCR
40.0000 meq | EXTENDED_RELEASE_TABLET | Freq: Two times a day (BID) | ORAL | Status: AC
  Administered 2020-08-26 (×2): 40 meq via ORAL
  Filled 2020-08-26 (×2): qty 2

## 2020-08-26 MED ORDER — HEPARIN SODIUM (PORCINE) 5000 UNIT/ML IJ SOLN
5000.0000 [IU] | Freq: Three times a day (TID) | INTRAMUSCULAR | Status: DC
  Administered 2020-08-26 – 2020-08-29 (×9): 5000 [IU] via SUBCUTANEOUS
  Filled 2020-08-26 (×9): qty 1

## 2020-08-26 NOTE — Plan of Care (Signed)
  Problem: Education: Goal: Knowledge of disease or condition will improve Outcome: Progressing Goal: Knowledge of patient specific risk factors addressed and post discharge goals established will improve Outcome: Progressing   Problem: Education: Goal: Knowledge of patient specific risk factors addressed and post discharge goals established will improve Outcome: Progressing   Problem: Coping: Goal: Will verbalize positive feelings about self Outcome: Progressing   Problem: Health Behavior/Discharge Planning: Goal: Ability to manage health-related needs will improve Outcome: Progressing   Problem: Nutrition: Goal: Risk of aspiration will decrease Outcome: Progressing   Problem: Intracerebral Hemorrhage Tissue Perfusion: Goal: Complications of Intracerebral Hemorrhage will be minimized Outcome: Progressing

## 2020-08-26 NOTE — Evaluation (Signed)
Speech Language Pathology Evaluation Patient Details Name: Deborah [name] MRN: [id number] DOB: [birth year] Today's Date: 08/26/2020 Time: 5462-7035 SLP Time Calculation (min) (ACUTE ONLY): 15 min  Problem List:  Patient Active Problem List   Diagnosis Date Noted  . ICH (intracerebral hemorrhage) (HCC) 08/24/2020  . Rash and nonspecific skin eruption 12/14/2019  . Breast lesion 12/14/2019  . Encounter for screening mammogram for malignant neoplasm of breast 12/14/2019  . Rash in adult 12/14/2019  . Close exposure to COVID-19 virus 08/10/2019  . Unstable angina (HCC) 04/08/2019  . Chest pain 04/08/2019  . Cough 07/11/2018  . Chronic pain of both ankles 09/30/2017  . Pain in joint involving ankle and foot 09/16/2017  . NAFLD (nonalcoholic fatty liver disease) 00/93/8182  . Knee pain, bilateral 04/12/2017  . Low back pain without sciatica 06/12/2016  . Nonspecific chest pain   . Vision changes 02/21/2016  . RUQ pain 03/24/2015  . 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 Encounter for therapeutic drug monitoring 09/30/2013  . 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 H/O prosthetic heart valve 07/08/2013  . Eosinophilia 01/27/2013  . Microcytic anemia 05/01/2012  . 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 Postprocedural state 05/22/2011  . Diastolic CHF, chronic (HCC) 03/15/2011  . Rheumatic heart disease   . Chronic atrial fibrillation (HCC)   . Chronic anticoagulation   . DM (diabetes mellitus) (HCC)   . Hyperlipidemia   . Mitral valve disorder 11/16/2010  . GOUT 11/29/2009  . TRICUSPID REGURGITATION, MODERATE WITH MILD PULMONARY HTN 11/29/2009  . HYPERBILIRUBINEMIA 06/22/2009  . HYPERGLYCEMIA 06/21/2009  . Hyperlipidemia associated with type 2 diabetes mellitus (HCC) 01/07/2009  . GERD 01/07/2009  . CARPAL TUNNEL SYNDROME, LEFT 11/11/2008   Past Medical History:  Past Medical History:  Diagnosis Date  . Allergic rhinitis   . Anemia   . Borderline  diabetes   . Carpal tunnel syndrome   . Chronic atrial fibrillation (HCC)    coumadin managed by [facility]. CVRR;  Event montor (7/12-8/12) showed no high rate episodes (patient continuously in atrial fibrillation).   . Chronic diastolic heart failure (HCC)   . Eosinophilia 01/27/2013  . External hemorrhoid   . External hemorrhoids   . Fatigue   . Gout   . 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 Gaylyn Rong  . Hx of cardiovascular stress test    ETT-myoview (7/12): No evidence for ischemia or infarction  . Hyperlipidemia   . Hypertension   . Obesity   . Rheumatic heart disease    Status post mechanical ([employer]) mitral valve replacement and tricuspid valve repair at [facility] in 2002; echo 5/12:   EF 60-65%, mild AI, mitral valve prosthesis with AVA 1.66, severe LAE, moderate RAE, moderate to severe TR, mild increased pulmonary artery systolic pressure;    Adenosine Cardiolite in 4/09:   No ischemia, EF 66%  . Rotator cuff syndrome    Past Surgical History:  Past Surgical History:  Procedure Laterality Date  . CARDIAC CATHETERIZATION     EF 55%  . TRICUSPID VALVE REPAIR     HPI:  The pt is a 69 yo female presenting with severe headache, nausea, and vomiting. 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 PMH includes: chronic afib, gout, HLD, HTN, obesity   Assessment / Plan / Recommendation Clinical Impression  Pt having nausea, kept eyes cloed but adequately alert. She demonstrated cognition and speech that were  appropriate; pt and daughter not noting changes in cognition. Daughter reports speech is lower in volume likely attributed to dizziness and nausea. She was given non visual portions of SLUMS and scored within normal range. Pt and daughter agree that ST services are not warranted at this time.    SLP Assessment  SLP Recommendation/Assessment: Patient does not need any further Speech Lanaguage  Pathology Services SLP Visit Diagnosis: Cognitive communication deficit (R41.841)    Follow Up Recommendations  None    Frequency and Duration           SLP Evaluation Cognition  Overall Cognitive Status: Within Functional Limits for tasks assessed Arousal/Alertness:  (awake) Orientation Level: Oriented X4 Attention: Sustained Sustained Attention: Appears intact Memory: Appears intact Awareness: Appears intact Problem Solving: Appears intact Safety/Judgment: Appears intact       Comprehension  Auditory Comprehension Overall Auditory Comprehension: Appears within functional limits for tasks assessed Visual Recognition/Discrimination Discrimination: Not tested Reading Comprehension Reading Status: Not tested    Expression Expression Primary Mode of Expression: Verbal Verbal Expression Overall Verbal Expression: Appears within functional limits for tasks assessed Pragmatics: No impairment Written Expression Dominant Hand: Right Written Expression: Not tested   Oral / Motor  Oral Motor/Sensory Function Overall Oral Motor/Sensory Function: Within functional limits Motor Speech Overall Motor Speech: Appears within functional limits for tasks assessed Phonation: Low vocal intensity Articulation: Within functional limitis Intelligibility: Intelligibility reduced Motor Planning: Witnin functional limits   GO                    Royce Macadamia 08/26/2020, 5:01 PM Breck Coons Laura Caldas M.Ed Nurse, children's [phone] Office [phone]

## 2020-08-26 NOTE — Progress Notes (Signed)
  Echocardiogram 2D Echocardiogram has been performed.  Deborah [name] 08/26/2020, 11:47 AM

## 2020-08-26 NOTE — Evaluation (Signed)
Occupational Therapy Evaluation Patient Details Name: Deborah [name] MRN: [id number] DOB: 05-27-1951 Today's Date: 08/26/2020    History of Present Illness The pt is a 69 yo female presenting with severe headache, nausea, and vomiting. 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 PMH includes: chronic afib, gout, HLD, HTN, obesity,   Clinical Impression   PT admitted with CVA. Pt currently with functional limitiations due to the deficits listed below (see OT problem list). Pt very limited by nausea with all attempts to move the patient. Pt starting to feel nauseated with AROM questions such as can you raise your arm. Pt with vision occluded entire session except when OT manually opened eyes. Pt limited to bed level this session. Recommendation for CIR as pt was indep prior and will need to increase activity tolerance with better control of nausea. Pt will benefit from skilled OT to increase their independence and safety with adls and balance to allow discharge  CIR.     Follow Up Recommendations  CIR    Equipment Recommendations  3 in 1 bedside commode    Recommendations for Other Services Rehab consult     Precautions / Restrictions Precautions Precautions: Fall Precaution Comments: watch HR      Mobility Bed Mobility Overal bed mobility: Needs Assistance Bed Mobility: Rolling Rolling: Mod assist         General bed mobility comments: pt total +2 max to scoot to the top of bed. pt is able to bridge buttock with cues to adjust pad. pt tolerates gradual decr of HOB to move toward [facility] for positioning. pt noted to have deflated mattress during session so bed plugged back in for air matress to fill. pt holding positioning in bed better    Transfers                 General transfer comment: defer to next session. unable to progress past chair position due to nausea    Balance                                            ADL either performed or assessed with clinical judgement   ADL Overall ADL's : Needs assistance/impaired Eating/Feeding: Total assistance Eating/Feeding Details (indicate cue type and reason): daughter total (A) bites of grits this morning per daughter then becoming nauseated Grooming: Maximal assistance;Bed level   Upper Body Bathing: Maximal assistance   Lower Body Bathing: Total assistance                         General ADL Comments: limited to bed level due to nauseated. Pt gagging but never vomits     Vision Baseline Vision/History: Wears glasses Wears Glasses: Reading only Patient Visual Report: No change from baseline Additional Comments: eyes closed the entires session when opened by therapist L eye dysconjugate but no nystagmus noted. pt becoming more nauseated     Perception     Praxis      Pertinent Vitals/Pain Pain Assessment: No/denies pain     Hand Dominance Right   Extremity/Trunk Assessment Upper Extremity Assessment Upper Extremity Assessment: LUE deficits/detail LUE Deficits / Details: decr grasp 3 out 5/ increased time to release on command   Lower Extremity Assessment Lower Extremity Assessment: LLE deficits/detail;RLE deficits/detail   Cervical / Trunk Assessment Cervical / Trunk Assessment: Normal   Communication Communication  Communication: No difficulties   Cognition Arousal/Alertness: Lethargic Behavior During Therapy: Flat affect Overall Cognitive Status: Within Functional Limits for tasks assessed                                 General Comments: answer home questions but reports very sleepy   General Comments  VSS    Exercises     Shoulder Instructions      Home Living Family/patient expects to be discharged to:: Private residence Living Arrangements: Children;Spouse/significant other Available Help at Discharge: Family;Available 24 hours/day Type of Home: House Home Access: Stairs to  enter [employer] of Steps: front 3; back 9 Entrance Stairs-Rails: None (front steps) Home Layout: One level     Bathroom Shower/Tub: Chief Strategy Officer: Standard     Home Equipment: None          Prior Functioning/Environment Level of Independence: Independent                 OT Problem List: Decreased activity tolerance;Impaired balance (sitting and/or standing);Decreased coordination;Decreased safety awareness;Decreased knowledge of use of DME or AE;Decreased knowledge of precautions;Cardiopulmonary status limiting activity      OT Treatment/Interventions: Self-care/ADL training;Therapeutic exercise;Neuromuscular education;Energy conservation;DME and/or AE instruction;Manual therapy;Modalities;Therapeutic activities;Patient/family education;Balance training    OT Goals(Current goals can be found in the care plan section) Acute Rehab OT Goals Patient Stated Goal: none stated OT Goal Formulation: With patient/family Time For Goal Achievement: 09/09/20 Potential to Achieve Goals: Good  OT Frequency: Min 2X/week   Barriers to D/C:            Co-evaluation PT/OT/SLP Co-Evaluation/Treatment: Yes Reason for Co-Treatment: For patient/therapist safety;Necessary to address cognition/behavior during functional activity;Complexity of the patient's impairments (multi-system involvement)   OT goals addressed during session: ADL's and self-care;Proper use of Adaptive equipment and DME;Strengthening/ROM      AM-PAC OT "6 Clicks" Daily Activity     Outcome Measure Help from another person eating meals?: A Lot Help from another person taking care of personal grooming?: A Lot Help from another person toileting, which includes using toliet, bedpan, or urinal?: A Lot Help from another person bathing (including washing, rinsing, drying)?: A Lot Help from another person to put on and taking off regular upper body clothing?: A Lot Help from another person  to put on and taking off regular lower body clothing?: A Lot 6 Click Score: 12   End of Session Nurse Communication: Mobility status;Weight bearing status  Activity Tolerance: Patient limited by lethargy;Other (comment) (nauseated) Patient left: in bed;with call bell/phone within reach;with bed alarm set;with family/visitor present;with SCD's reapplied  OT Visit Diagnosis: Unsteadiness on feet (R26.81);Muscle weakness (generalized) (M62.81)                Time: 3762-8315 OT Time Calculation (min): 23 min Charges:  OT General Charges $OT Visit: 1 Visit OT Evaluation $OT Eval Moderate Complexity: 1 Mod   Brynn, OTR/L  Acute Rehabilitation Services Pager: [phone] Office: [phone] .   Mateo Flow 08/26/2020, 1:34 PM

## 2020-08-26 NOTE — Progress Notes (Addendum)
STROKE TEAM PROGRESS NOTE   INTERVAL HISTORY Daughter at the bedside and son is on the phone. Pt lying in bed, still has mild HA, she had repeat CT last night for HA and was stable. This morning pt seems more awake alert and interactive than yesterday, however still has dizziness and dry heave with eye movement. BP under control with po meds and off cleviprex.    Vitals:   08/26/20 0700 08/26/20 0800 08/26/20 0830 08/26/20 0900  BP: 122/76 (!) 154/78 125/78 119/64  Pulse: 87 85 83 88  Resp: 16 20 17 16   Temp:  98.6 F (37 C)    TempSrc:  Oral    SpO2: 99% 95% 99% 100%  Weight:      Height:       CBC:  Recent Labs  Lab 08/24/20 2003 08/24/20 2158 08/26/20 0714  WBC 10.1  --  9.1  NEUTROABS 4.4  --   --   HGB 11.0* 12.6 9.7*  HCT 36.3 37.0 32.1*  MCV 76.7*  --  76.2*  PLT 201  --  168   Basic Metabolic Panel:  Recent Labs  Lab 08/24/20 2115 08/24/20 2158  NA 137 139  K 4.3 4.7  CL 103 103  CO2 24  --   GLUCOSE 188* 223*  BUN 29* 40*  CREATININE 1.45* 1.30*  CALCIUM 8.1*  --    Lipid Panel:  Recent Labs  Lab 08/24/20 2115  CHOL 150  TRIG 170*  HDL 45  CHOLHDL 3.3  VLDL 34  LDLCALC 71   HgbA1c:  Recent Labs  Lab 08/24/20 2154  HGBA1C 7.5*   Urine Drug Screen: No results for input(s): LABOPIA, COCAINSCRNUR, LABBENZ, AMPHETMU, THCU, LABBARB in the last 168 hours.  Alcohol Level No results for input(s): ETH in the last 168 hours.  IMAGING past 24 hours CT ANGIO HEAD W OR WO CONTRAST  Result Date: 08/25/2020 CLINICAL DATA:  Stroke/TIA, assess intracranial arteries EXAM: CT ANGIOGRAPHY HEAD AND NECK TECHNIQUE: Multidetector CT imaging of the head and neck was performed using the standard protocol during bolus administration of intravenous contrast. Multiplanar CT image reconstructions and MIPs were obtained to evaluate the vascular anatomy. Carotid stenosis measurements (when applicable) are obtained utilizing NASCET criteria, using the distal internal  carotid diameter as the denominator. CONTRAST:  108mL OMNIPAQUE IOHEXOL 350 MG/ML SOLN COMPARISON:  08/24/2020 and prior.  Concurrent noncontrast head CT. FINDINGS: CTA NECK FINDINGS Aortic arch: Standard branching. Imaged portion shows no evidence of aneurysm or dissection. No significant stenosis of the major arch vessel origins. Minimal atherosclerotic calcifications. Right carotid system: No evidence of dissection, stenosis (50% or greater) or occlusion. Left carotid system: No evidence of dissection, stenosis (50% or greater) or occlusion. Vertebral arteries: Dominant right vertebral artery. No evidence of dissection, stenosis (50% or greater) or occlusion. Skeleton: No acute or suspicious osseous abnormalities. Post sternotomy sequela. Other neck: No adenopathy.  No soft tissue mass. Upper chest: Upper lung atelectasis.  No acute finding. Review of the MIP images confirms the above findings CTA HEAD FINDINGS Anterior circulation: No significant stenosis, proximal occlusion, aneurysm, or vascular malformation. Bilateral carotid siphon atherosclerotic calcifications. Posterior circulation: No significant stenosis, proximal occlusion, aneurysm, or vascular malformation. Venous sinuses: As permitted by contrast timing, patent. Anatomic variants: None. Review of the MIP images confirms the above findings IMPRESSION: No large vessel occlusion, dissection or aneurysm. No high-grade narrowing of the major intracranial or neck vessels. Cerebellar vermis hemorrhage is better evaluated on concurrent head CT. Electronically  Signed   By: Stana Bunting M.D.   On: 08/25/2020 09:30   CT HEAD WO CONTRAST  Result Date: 08/25/2020 CLINICAL DATA:  Suspected cerebral hemorrhage EXAM: CT HEAD WITHOUT CONTRAST TECHNIQUE: Contiguous axial images were obtained from the base of the skull through the vertex without intravenous contrast. COMPARISON:  CT head 08/25/2020.  MRI brain 08/24/2020 FINDINGS: Brain: Focal acute  hemorrhage in the region of the cerebellar vermis measuring 2.2 cm in diameter. No significant change in appearance since prior study. No new hemorrhage is identified. Examination is otherwise unchanged. Vascular: Moderate intracranial arterial vascular calcifications. Skull: Calvarium appears intact. Sinuses/Orbits: Paranasal sinuses and mastoid air cells are clear. Other: None. IMPRESSION: Focal acute hemorrhage in the region of the cerebellar vermis measuring 2.2 cm in diameter. No significant change in appearance since prior study. No new hemorrhage is identified. Electronically Signed   By: Burman Nieves M.D.   On: 08/25/2020 20:43   CT ANGIO NECK W OR WO CONTRAST  Result Date: 08/25/2020 CLINICAL DATA:  Stroke/TIA, assess intracranial arteries EXAM: CT ANGIOGRAPHY HEAD AND NECK TECHNIQUE: Multidetector CT imaging of the head and neck was performed using the standard protocol during bolus administration of intravenous contrast. Multiplanar CT image reconstructions and MIPs were obtained to evaluate the vascular anatomy. Carotid stenosis measurements (when applicable) are obtained utilizing NASCET criteria, using the distal internal carotid diameter as the denominator. CONTRAST:  28mL OMNIPAQUE IOHEXOL 350 MG/ML SOLN COMPARISON:  08/24/2020 and prior.  Concurrent noncontrast head CT. FINDINGS: CTA NECK FINDINGS Aortic arch: Standard branching. Imaged portion shows no evidence of aneurysm or dissection. No significant stenosis of the major arch vessel origins. Minimal atherosclerotic calcifications. Right carotid system: No evidence of dissection, stenosis (50% or greater) or occlusion. Left carotid system: No evidence of dissection, stenosis (50% or greater) or occlusion. Vertebral arteries: Dominant right vertebral artery. No evidence of dissection, stenosis (50% or greater) or occlusion. Skeleton: No acute or suspicious osseous abnormalities. Post sternotomy sequela. Other neck: No adenopathy.  No soft  tissue mass. Upper chest: Upper lung atelectasis.  No acute finding. Review of the MIP images confirms the above findings CTA HEAD FINDINGS Anterior circulation: No significant stenosis, proximal occlusion, aneurysm, or vascular malformation. Bilateral carotid siphon atherosclerotic calcifications. Posterior circulation: No significant stenosis, proximal occlusion, aneurysm, or vascular malformation. Venous sinuses: As permitted by contrast timing, patent. Anatomic variants: None. Review of the MIP images confirms the above findings IMPRESSION: No large vessel occlusion, dissection or aneurysm. No high-grade narrowing of the major intracranial or neck vessels. Cerebellar vermis hemorrhage is better evaluated on concurrent head CT. Electronically Signed   By: Stana Bunting M.D.   On: 08/25/2020 09:30   DG CHEST PORT 1 VIEW  Result Date: 08/25/2020 CLINICAL DATA:  Aspiration. EXAM: PORTABLE CHEST 1 VIEW COMPARISON:  08/24/2020, 07/15/2018 FINDINGS: No focal consolidation. No pleural effusion or pneumothorax. Stable cardiomegaly. Prior median sternotomy. No acute osseous abnormality. IMPRESSION: No acute cardiopulmonary disease. Electronically Signed   By: Elige Ko   On: 08/25/2020 09:45    PHYSICAL EXAM   Temp:  [97.7 F (36.5 C)-98.6 F (37 C)] 98.6 F (37 C) (12/31 0800) Pulse Rate:  [41-127] 88 (12/31 0900) Resp:  [15-29] 16 (12/31 0900) BP: (91-154)/(60-117) 119/64 (12/31 0900) SpO2:  [91 %-100 %] 100 % (12/31 0900)  General - Well nourished, well developed, lethargic but more awake alert than yesterday.  Ophthalmologic - fundi not visualized due to noncooperation.  Cardiovascular - irregularly irregular heart rate and rhythm.  Neuro - lethargic, more awake alert and interactive than yesterday, eyes closed but easily open with voice. Fully orientated and no aphasia, able to name and repeat, follows all simple commands. However, intermittent coughing and dry heave with eye  movement or motion. Visual field full, no gaze palsy, no significant nystagmus seen on my exam. Facial symmetrical, tongue midline. PERRL. Moving b/l against gravity symmetrical, moving b/l LEs 3/5 proximal and 4/5 distally. Sensation symmetrical. B/l FTN grossly intact without ataxia. Gait not tested.    ASSESSMENT/PLAN [name] is a 69 y.o. 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 No source of embolus. LA severely dilated.  LDL 71  HgbA1c 7.5  VTE prophylaxis - heparin subq  aspirin 81 mg daily and warfarin daily prior to admission, now on No antithrombotic.  Therapy recommendations:  pending  Disposition:  pending  Chronic afib Mechanical MV TV repair  On warfarin at home  INR 4.3->2.1->kcentra and vitK->1.1  Off AC at this time  May consider heparin drip once pt stablized from ICH standpoint to prevent emboli - may be early next week if clinically stable.  Pt does have high risk of embolic event with mechanical valve at this time - discussed with daughter and son  Hypertension  Home meds:  Imdur, metoprolol, torsemide  Stable  Off cleviprex now . Resumed imdur and metoprolol . Resume torsemide and potassium  . Long-term BP goal normotensive  Hyperlipidemia  Home meds:  crestor 10  LDL 71, goal < 70  Hold off crestor now due to ICH  May consider to resume statin at discharge  Diabetes type II Uncontrolled  Home meds:  metformin  HgbA1c 7.5, goal < 7.0  CBGs   SSI  PCP close follow up  Other Stroke Risk  Factors  Advanced Age >/= 47   Obesity, Body mass index is 30.81 kg/m., BMI >/= 30 associated with increased stroke risk, recommend weight loss, diet and exercise as appropriate   Other Active Problems  HA, nausea and vomiting secondary to ICH. Sx management  CKD IIIa, Cre 1.45->1.30->1.29  Hospital day # 2   This patient is critically ill due to cerebellar ICH, cerebellar edema, chronic afib, mechanical valve off AC, hypertensive emergency and at significant risk of neurological worsening, death form hematoma expansion, brain herniation, embolic event, heart failure, hydrocephalus and seizure. This patient's care requires constant monitoring of vital signs, hemodynamics, respiratory and cardiac monitoring, review of multiple databases, neurological assessment, discussion with family, other specialists and medical decision making of high complexity. I spent 50 minutes of neurocritical care time in the care of this patient. I had long discussion with daughter at bedside and son on the phone, updated pt current condition, treatment plan and potential prognosis, and answered all the questions. They expressed understanding and appreciation.    Marvel Plan, MD PhD Stroke Neurology 08/26/2020 9:11 AM    To contact Stroke Continuity provider, please refer to [email or web address]. After hours, contact General Neurology

## 2020-08-26 NOTE — Evaluation (Signed)
Physical Therapy Evaluation Patient Details Name: Deborah [name] MRN: [id number] DOB: Sep 18, 1950 Today's Date: 08/26/2020   History of Present Illness  The pt is a 69 yo female presenting with severe headache, nausea, and vomiting. 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 PMH includes: chronic afib, gout, HLD, HTN, obesity  Clinical Impression   Pt presents with generalized weakness, severe nausea and dry heaving with mobility as well as eye opening, difficulty mobilizing, and decreased activity tolerance. Pt to benefit from acute PT to address deficits. Pt requiring significant physical assist for bed mobility tasks today, tolerating repositioning in bed and scooting up in bed only given severe nausea and dizziness. At baseline, pt is completely independent, recommending CIR post-acutely. PT to progress mobility as tolerated, and will continue to follow acutely.      Follow Up Recommendations CIR    Equipment Recommendations  None recommended by PT    Recommendations for Other Services       Precautions / Restrictions Precautions Precautions: Fall Precaution Comments: afib Restrictions Weight Bearing Restrictions: No      Mobility  Bed Mobility Overal bed mobility: Needs Assistance Bed Mobility: Rolling Rolling: Mod assist         General bed mobility comments: total +2 max to scoot to the top of bed with boost function and bed pads. pt is able to bridge buttock with cues to adjust pad. pt tolerates gradual decr of HOB to move toward [facility] for positioning. pt noted to have deflated mattress during session so bed plugged back in for air matress to fill.    Transfers                 General transfer comment: defer to next session. unable to progress past chair position due to nausea/dry heaving  Ambulation/Gait                Stairs            Wheelchair Mobility    Modified Rankin (Stroke Patients  Only) Modified Rankin (Stroke Patients Only) Pre-Morbid Rankin Score: No symptoms Modified Rankin: Moderately severe disability     Balance                                             Pertinent Vitals/Pain Pain Assessment: No/denies pain    Home Living Family/patient expects to be discharged to:: Private residence Living Arrangements: Children;Spouse/significant other Available Help at Discharge: Family;Available 24 hours/day Type of Home: House Home Access: Stairs to enter Entrance Stairs-Rails: None (front steps) Entrance Stairs-Number of Steps: front 3; back 9 Home Layout: One level Home Equipment: None      Prior Function Level of Independence: Independent         Comments: ADLs and IADLs. Enjoys walking for exercises. Does not drive     Hand Dominance   Dominant Hand: Right    Extremity/Trunk Assessment   Upper Extremity Assessment Upper Extremity Assessment: Defer to OT evaluation LUE Deficits / Details: decr grasp 3 out 5/ increased time to release on command    Lower Extremity Assessment Lower Extremity Assessment: Generalized weakness (WFL heel to shin test bilaterally)    Cervical / Trunk Assessment Cervical / Trunk Assessment: Normal  Communication   Communication: No difficulties  Cognition Arousal/Alertness: Lethargic Behavior During Therapy: Flat affect Overall Cognitive Status: Within Functional Limits for tasks assessed  General Comments: answer home questions but reports very sleepy      General Comments General comments (skin integrity, edema, etc.): VSS, in afib rhythm, daughter at bedside and very supportive    Exercises     Assessment/Plan    PT Assessment Patient needs continued PT services  PT Problem List Decreased mobility;Decreased balance;Decreased knowledge of use of DME;Decreased activity tolerance;Decreased strength       PT Treatment Interventions  DME instruction;Therapeutic activities;Gait training;Therapeutic exercise;Patient/family education;Balance training;Stair training;Functional mobility training;Neuromuscular re-education    PT Goals (Current goals can be found in the Care Plan section)  Acute Rehab PT Goals Patient Stated Goal: stop the nausea PT Goal Formulation: With patient/family Time For Goal Achievement: 09/09/20 Potential to Achieve Goals: Good    Frequency Min 4X/week   Barriers to discharge        Co-evaluation   Reason for Co-Treatment: For patient/therapist safety;Necessary to address cognition/behavior during functional activity;Complexity of the patient's impairments (multi-system involvement)   OT goals addressed during session: ADL's and self-care;Proper use of Adaptive equipment and DME;Strengthening/ROM       AM-PAC PT "6 Clicks" Mobility  Outcome Measure Help needed turning from your back to your side while in a flat bed without using bedrails?: A Lot Help needed moving from lying on your back to sitting on the side of a flat bed without using bedrails?: Total Help needed moving to and from a bed to a chair (including a wheelchair)?: Total Help needed standing up from a chair using your arms (e.g., wheelchair or bedside chair)?: Total Help needed to walk in hospital room?: Total Help needed climbing 3-5 steps with a railing? : Total 6 Click Score: 7    End of Session Equipment Utilized During Treatment: Gait belt Activity Tolerance: Patient limited by fatigue;Other (comment) (nausea and near-vomiting) Patient left: in bed;with call bell/phone within reach;with family/visitor present;with SCD's reapplied;with bed alarm set Nurse Communication: Mobility status PT Visit Diagnosis: Other abnormalities of gait and mobility (R26.89);Other symptoms and signs involving the nervous system (R29.898)    Time: 1610-9604 PT Time Calculation (min) (ACUTE ONLY): 26 min   Charges:   PT Evaluation $PT  Eval Low Complexity: 1 Low          Aracelis Ulrey S, PT Acute Rehabilitation Services Pager [phone]  Office [phone]   Truddie Coco 08/26/2020, 2:44 PM

## 2020-08-27 DIAGNOSIS — I629 Nontraumatic intracranial hemorrhage, unspecified: Secondary | ICD-10-CM

## 2020-08-27 LAB — PROTIME-INR
INR: 1.1 (ref 0.8–1.2)
Prothrombin Time: 13.4 seconds (ref 11.4–15.2)

## 2020-08-27 LAB — CBC
HCT: 32.2 % — ABNORMAL LOW (ref 36.0–46.0)
Hemoglobin: 10 g/dL — ABNORMAL LOW (ref 12.0–15.0)
MCH: 23.8 pg — ABNORMAL LOW (ref 26.0–34.0)
MCHC: 31.1 g/dL (ref 30.0–36.0)
MCV: 76.5 fL — ABNORMAL LOW (ref 80.0–100.0)
Platelets: 153 10*3/uL (ref 150–400)
RBC: 4.21 MIL/uL (ref 3.87–5.11)
RDW: 16.5 % — ABNORMAL HIGH (ref 11.5–15.5)
WBC: 8.2 10*3/uL (ref 4.0–10.5)
nRBC: 0.2 % (ref 0.0–0.2)

## 2020-08-27 LAB — BASIC METABOLIC PANEL
Anion gap: 11 (ref 5–15)
BUN: 15 mg/dL (ref 8–23)
CO2: 24 mmol/L (ref 22–32)
Calcium: 8 mg/dL — ABNORMAL LOW (ref 8.9–10.3)
Chloride: 106 mmol/L (ref 98–111)
Creatinine, Ser: 1.15 mg/dL — ABNORMAL HIGH (ref 0.44–1.00)
GFR, Estimated: 52 mL/min — ABNORMAL LOW (ref >60)
Glucose, Bld: 164 mg/dL — ABNORMAL HIGH (ref 70–99)
Potassium: 3.8 mmol/L (ref 3.5–5.1)
Sodium: 141 mmol/L (ref 135–145)

## 2020-08-27 LAB — GLUCOSE, CAPILLARY
Glucose-Capillary: 187 mg/dL — ABNORMAL HIGH (ref 70–99)
Glucose-Capillary: 204 mg/dL — ABNORMAL HIGH (ref 70–99)
Glucose-Capillary: 135 mg/dL — ABNORMAL HIGH (ref 70–99)
Glucose-Capillary: 173 mg/dL — ABNORMAL HIGH (ref 70–99)

## 2020-08-27 MED ORDER — POTASSIUM CHLORIDE CRYS ER 20 MEQ PO TBCR
40.0000 meq | EXTENDED_RELEASE_TABLET | Freq: Two times a day (BID) | ORAL | Status: DC
  Administered 2020-08-27 – 2020-09-02 (×13): 40 meq via ORAL
  Filled 2020-08-27 (×13): qty 2

## 2020-08-27 MED ORDER — BENZONATATE 100 MG PO CAPS
100.0000 mg | ORAL_CAPSULE | Freq: Three times a day (TID) | ORAL | Status: DC | PRN
  Filled 2020-08-27 (×2): qty 1

## 2020-08-27 NOTE — Plan of Care (Signed)
  Problem: Clinical Measurements: Goal: Ability to maintain clinical measurements within normal limits will improve Outcome: Progressing   

## 2020-08-27 NOTE — Progress Notes (Signed)
STROKE TEAM PROGRESS NOTE   INTERVAL HISTORY No overnight events still with some nausea and mild worsening of headache with head rotation.   Vitals:   08/27/20 0600 08/27/20 0700 08/27/20 0800 08/27/20 0830  BP: 134/90 (!) 145/78 (!) 155/88 (!) 143/81  Pulse: 88 77 79 77  Resp: 18 18 16 18   Temp:      TempSrc:      SpO2: 95% 96% 96% 91%  Weight:      Height:       CBC:  Recent Labs  Lab 08/24/20 2003 08/24/20 2158 08/26/20 0714 08/27/20 0204  WBC 10.1  --  9.1 8.2  NEUTROABS 4.4  --   --   --   HGB 11.0*   < > 9.7* 10.0*  HCT 36.3   < > 32.1* 32.2*  MCV 76.7*  --  76.2* 76.5*  PLT 201  --  168 153   < > = values in this interval not displayed.   Basic Metabolic Panel:  Recent Labs  Lab 08/26/20 0714 08/27/20 0204  NA 141 141  K 3.5 3.8  CL 106 106  CO2 23 24  GLUCOSE 179* 164*  BUN 23 15  CREATININE 1.29* 1.15*  CALCIUM 8.1* 8.0*   Lipid Panel:  Recent Labs  Lab 08/24/20 2115  CHOL 150  TRIG 170*  HDL 45  CHOLHDL 3.3  VLDL 34  LDLCALC 71   HgbA1c:  Recent Labs  Lab 08/24/20 2154  HGBA1C 7.5*   Urine Drug Screen: No results for input(s): LABOPIA, COCAINSCRNUR, LABBENZ, AMPHETMU, THCU, LABBARB in the last 168 hours.  Alcohol Level No results for input(s): ETH in the last 168 hours.  IMAGING past 24 hours  DG CHEST PORT 1 VIEW 08/26/2020 IMPRESSION:  1. Marked cardiac enlargement with pulmonary vascular congestion.  2. Nodular opacity in the LEFT upper chest versus vessel on end. Follow-up PA and lateral chest is suggested to ensure resolution and or CT of the chest on follow-up after resolution of acute process.  3. No lobar consolidative changes. No overt pulmonary edema.  4. Question of LEFT lower lobe atelectasis, attention on follow-up.   ECHOCARDIOGRAM COMPLETE 08/26/2020 IMPRESSIONS   1. Left ventricular ejection fraction, by estimation, is 55 to 60%. The left ventricle has normal function. The left ventricle has no regional wall  motion abnormalities. There is mild left ventricular hypertrophy. Left ventricular diastolic parameters are indeterminate.   2. Right ventricular systolic function is mildly reduced. The right ventricular size is normal. There is moderately elevated pulmonary artery systolic pressure. The estimated right ventricular systolic pressure is 89.2 mmHg.   3. Left atrial size was severely dilated.   4. Right atrial size was severely dilated.   5. 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 Mean gradient 9 mmHg but PHT 76 msec (short). Suspect that she does not have hemodynamically significant mitral mitral stenosis and there does not appear to be significant mitral regurgitation.   6. Status post tricuspid valve repair. Mean gradient 6 mmHg with mild-moderate tricuspid regurgitation.   7. The aortic valve is tricuspid. Aortic valve regurgitation is mild. Mild aortic valve sclerosis is present, with no evidence of aortic valve stenosis.   8. The inferior vena cava is dilated in size with <50% respiratory variability, suggesting right atrial pressure of 15 mmHg.   9. The patient is in atrial fibrillation.   PHYSICAL EXAM   Temp:  [97.5 F (36.4 C)-99.1 F (37.3 C)] 98.6 F (37 C) (01/01 0400) Pulse  Rate:  [31-96] 77 (01/01 0830) Resp:  [11-21] 18 (01/01 0830) BP: (106-155)/(67-105) 143/81 (01/01 0830) SpO2:  [91 %-100 %] 91 % (01/01 0830)  General - Well nourished, well developed, mildly lethargic but awake, alert.  Ophthalmologic - fundi not visualized due to noncooperation.  Cardiovascular - irregularly irregular heart rate and rhythm.  Neuro - eyes closed, lethargic, wake to alert to voice and interactive. Fully orientated and no aphasia, able to name and repeat, follows all simple commands. However, intermittent coughing and dry heave with eye movement or motion. Visual field full, no gaze palsy, no significant nystagmus seen on my exam. Facial symmetrical, tongue midline. PERRL. Moving b/l against  gravity symmetrical, moving b/l LEs 3/5 proximal and 4/5 distally. Sensation symmetrical. B/l FTN grossly intact without ataxia. Gait not tested.   ASSESSMENT/PLAN [name] is a 70 y.o. 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 No source of embolus. LA severely dilated ; mechanical mitral valve ; tricuspid valve repair ; patient was in atrial fibrillation.   LDL 71  HgbA1c 7.5  VTE prophylaxis - heparin subq  aspirin 81 mg daily and warfarin daily prior to admission, now on No antithrombotic.  Therapy recommendations:  CIR recommended  Disposition:  pending  Chronic afib Mechanical MV TV repair  On warfarin at home  INR 4.3->2.1->kcentra and vitK->1.1  Off AC at this time  May consider heparin drip once pt stablized from ICH standpoint to prevent emboli - may be early next week if clinically stable.  Pt does have high risk of embolic event with mechanical valve at this time - discussed with daughter and son (phone)  CXR - 08/26/20 - Marked cardiac enlargement with pulmonary vascular congestion.  Demadex PTA - 80 mg Q AM - 60 mg QPM -> resumed 08/26/20 (cardiologist - Laurey Morale, MD - [facility])  Potassium 3.5->3.8  Hypertension  Home meds:  Imdur, metoprolol, torsemide  Stable  Off cleviprex now . Resumed imdur and metoprolol . Resume torsemide and potassium  . Long-term BP goal normotensive  Hyperlipidemia  Home meds: Crestor 10  LDL 71, goal < 70  Hold off crestor now due to ICH  May consider resuming  statin at discharge  Diabetes type II Uncontrolled  Home meds:  metformin  HgbA1c 7.5, goal < 7.0  CBGs   SSI  PCP close follow up   Other Stroke Risk Factors  Advanced Age >/= 16   Obesity, Body mass index is 30.81 kg/m., BMI >/= 30 associated with increased stroke risk, recommend weight loss, diet and exercise as appropriate   Other Active Problems  HA, nausea and vomiting secondary to ICH. Sx management  CKD IIIa, Cre 1.45->1.30->1.29->1.15  Nodular opacity in the LEFT upper chest versus vessel on end. Follow-up PA and lateral chest is suggested to ensure resolution and or CT of the chest on follow-up after resolution of acute process.   Hospital day # 3   Discussed current condition and timing of anticoagulation with daughter and brother. Daughter had concerns regarding CHF and advised  to notify patient's cardiologist that patient is in hospital. Daughter would like CT chest prior to discharge in order to workup the lung nodule.   To contact Stroke Continuity provider, please refer to [email or web address]. After hours, contact General Neurology

## 2020-08-28 ENCOUNTER — Inpatient Hospital Stay (HOSPITAL_COMMUNITY): Payer: Medicare Other

## 2020-08-28 LAB — CBC
HCT: 34.5 % — ABNORMAL LOW (ref 36.0–46.0)
Hemoglobin: 10.7 g/dL — ABNORMAL LOW (ref 12.0–15.0)
MCH: 23.5 pg — ABNORMAL LOW (ref 26.0–34.0)
MCHC: 31 g/dL (ref 30.0–36.0)
MCV: 75.8 fL — ABNORMAL LOW (ref 80.0–100.0)
Platelets: 158 10*3/uL (ref 150–400)
RBC: 4.55 MIL/uL (ref 3.87–5.11)
RDW: 16.1 % — ABNORMAL HIGH (ref 11.5–15.5)
WBC: 7.8 10*3/uL (ref 4.0–10.5)
nRBC: 0.3 % — ABNORMAL HIGH (ref 0.0–0.2)

## 2020-08-28 LAB — BASIC METABOLIC PANEL
Anion gap: 10 (ref 5–15)
BUN: 12 mg/dL (ref 8–23)
CO2: 25 mmol/L (ref 22–32)
Calcium: 8.4 mg/dL — ABNORMAL LOW (ref 8.9–10.3)
Chloride: 105 mmol/L (ref 98–111)
Creatinine, Ser: 1.15 mg/dL — ABNORMAL HIGH (ref 0.44–1.00)
GFR, Estimated: 52 mL/min — ABNORMAL LOW (ref >60)
Glucose, Bld: 156 mg/dL — ABNORMAL HIGH (ref 70–99)
Potassium: 4.1 mmol/L (ref 3.5–5.1)
Sodium: 140 mmol/L (ref 135–145)

## 2020-08-28 LAB — PROTIME-INR
INR: 1 (ref 0.8–1.2)
Prothrombin Time: 13.1 seconds (ref 11.4–15.2)

## 2020-08-28 LAB — GLUCOSE, CAPILLARY
Glucose-Capillary: 199 mg/dL — ABNORMAL HIGH (ref 70–99)
Glucose-Capillary: 217 mg/dL — ABNORMAL HIGH (ref 70–99)
Glucose-Capillary: 139 mg/dL — ABNORMAL HIGH (ref 70–99)
Glucose-Capillary: 187 mg/dL — ABNORMAL HIGH (ref 70–99)

## 2020-08-28 MED ORDER — DIVALPROEX SODIUM ER 500 MG PO TB24
500.0000 mg | ORAL_TABLET | Freq: Every day | ORAL | Status: DC
  Filled 2020-08-28 (×6): qty 1

## 2020-08-28 NOTE — Progress Notes (Signed)
STROKE TEAM PROGRESS NOTE   INTERVAL HISTORY Patient's daughter is at the bedside.  Patient still complains of mild headache particularly in the head movements but denies any nausea or vertigo.  Blood pressure adequately controlled.  Vital signs stable.  Neurological exam unchanged.  Vitals:   08/28/20 0300 08/28/20 0400 08/28/20 0500 08/28/20 0600  BP: 118/77 121/85 102/77 121/75  Pulse: 75 85 71 94  Resp: 17 15 19 13   Temp:  98.7 F (37.1 C)    TempSrc:  Oral    SpO2: 97% 96% 94% 97%  Weight:      Height:       CBC:  Recent Labs  Lab 08/24/20 2003 08/24/20 2158 08/27/20 0204 08/28/20 0059  WBC 10.1   < > 8.2 7.8  NEUTROABS 4.4  --   --   --   HGB 11.0*   < > 10.0* 10.7*  HCT 36.3   < > 32.2* 34.5*  MCV 76.7*   < > 76.5* 75.8*  PLT 201   < > 153 158   < > = values in this interval not displayed.   Basic Metabolic Panel:  Recent Labs  Lab 08/27/20 0204 08/28/20 0059  NA 141 140  K 3.8 4.1  CL 106 105  CO2 24 25  GLUCOSE 164* 156*  BUN 15 12  CREATININE 1.15* 1.15*  CALCIUM 8.0* 8.4*   Lipid Panel:  Recent Labs  Lab 08/24/20 2115  CHOL 150  TRIG 170*  HDL 45  CHOLHDL 3.3  VLDL 34  LDLCALC 71   HgbA1c:  Recent Labs  Lab 08/24/20 2154  HGBA1C 7.5*   Urine Drug Screen: No results for input(s): LABOPIA, COCAINSCRNUR, LABBENZ, AMPHETMU, THCU, LABBARB in the last 168 hours.  Alcohol Level No results for input(s): ETH in the last 168 hours.  IMAGING past 24 hours  DG CHEST PORT 1 VIEW 08/26/2020 IMPRESSION:  1. Marked cardiac enlargement with pulmonary vascular congestion.  2. Nodular opacity in the LEFT upper chest versus vessel on end. Follow-up PA and lateral chest is suggested to ensure resolution and or CT of the chest on follow-up after resolution of acute process.  3. No lobar consolidative changes. No overt pulmonary edema.  4. Question of LEFT lower lobe atelectasis, attention on follow-up.   ECHOCARDIOGRAM COMPLETE 08/26/2020 IMPRESSIONS    1. Left ventricular ejection fraction, by estimation, is 55 to 60%. The left ventricle has normal function. The left ventricle has no regional wall motion abnormalities. There is mild left ventricular hypertrophy. Left ventricular diastolic parameters are indeterminate.   2. Right ventricular systolic function is mildly reduced. The right ventricular size is normal. There is moderately elevated pulmonary artery systolic pressure. The estimated right ventricular systolic pressure is 45.0 mmHg.   3. Left atrial size was severely dilated.   4. Right atrial size was severely dilated.   5. 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 Mean gradient 9 mmHg but PHT 76 msec (short). Suspect that she does not have hemodynamically significant mitral mitral stenosis and there does not appear to be significant mitral regurgitation.   6. Status post tricuspid valve repair. Mean gradient 6 mmHg with mild-moderate tricuspid regurgitation.   7. The aortic valve is tricuspid. Aortic valve regurgitation is mild. Mild aortic valve sclerosis is present, with no evidence of aortic valve stenosis.   8. The inferior vena cava is dilated in size with <50% respiratory variability, suggesting right atrial pressure of 15 mmHg.   9. The patient is in  atrial fibrillation.   PHYSICAL EXAM   Temp:  [98.4 F (36.9 C)-99.2 F (37.3 C)] 98.7 F (37.1 C) (01/02 0400) Pulse Rate:  [68-99] 94 (01/02 0600) Resp:  [13-22] 13 (01/02 0600) BP: (102-155)/(67-94) 121/75 (01/02 0600) SpO2:  [91 %-100 %] 97 % (01/02 0600)  General -pleasant middle-age Asian [place] lady not in distress  Ophthalmologic - fundi not visualized due to noncooperation.  Cardiovascular - irregularly irregular heart rate and rhythm.  Neuro -awake alert and interactive.  Oriented to time place and person.  No dysarthria and no aphasia, able to name and repeat, follows all simple commands.   Visual field full, no gaze palsy, no significant nystagmus seen on my  exam. Facial symmetrical, tongue midline. PERRL. Moving b/l against gravity symmetrical, moving b/l LEs 3/5 proximal and 4/5 distally. Sensation symmetrical. B/l FTN grossly intact without ataxia. Gait not tested.   ASSESSMENT/PLAN Deborah [name] is a 70 y.o. 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 No source of embolus. LA severely dilated ; mechanical mitral valve ; tricuspid valve repair ; patient was in atrial fibrillation.   LDL 71  HgbA1c 7.5  VTE prophylaxis - heparin subq  aspirin 81 mg daily and warfarin daily prior to admission, now on No antithrombotic.  Therapy recommendations:  CIR recommended  Disposition:  pending  Chronic afib Mechanical MV TV repair  On warfarin at home  INR 4.3->2.1->kcentra and vitK->1.1  Off AC at this time  May consider heparin drip once pt stablized from ICH standpoint to prevent emboli - may be early next week if clinically stable.  Pt does have high risk of embolic event with mechanical valve at this time - discussed with daughter and son (phone)  CXR - 08/26/20 - Marked cardiac enlargement with pulmonary vascular congestion.  Demadex PTA - 80 mg Q AM - 60 mg QPM -> resumed 08/26/20 (cardiologist - Laurey Morale, MD - [facility])  Potassium 3.5->3.8  Hypertension  Home meds:  Imdur, metoprolol, torsemide  Stable  Off cleviprex now . Resumed imdur and metoprolol . Resume torsemide and potassium  . Long-term BP goal normotensive  Hyperlipidemia  Home meds: Crestor 10  LDL 71, goal <  70  Hold off crestor now due to ICH  May consider resuming statin at discharge  Diabetes type II Uncontrolled  Home meds:  metformin  HgbA1c 7.5, goal < 7.0  CBGs   SSI  PCP close follow up   Other Stroke Risk Factors  Advanced Age >/= 69   Obesity, Body mass index is 30.81 kg/m., BMI >/= 30 associated with increased stroke risk, recommend weight loss, diet and exercise as appropriate   Other Active Problems, Findings and Recommendations  HA, nausea and vomiting secondary to ICH. Sx management  CKD IIIa, Cre 1.45->1.30->1.29->1.15->1.15  Nodular opacity in the LEFT upper chest versus vessel on end. Follow-up PA and lateral chest is suggested to ensure resolution and or CT of the chest on follow-up after resolution of acute process. Repeat CXR  08/28/20 - Stable moderate cardiomegaly without  pulmonary edema. No active  pulmonary disease.  Consider Chest CT as per radiology and note from Dr Theda Sers regarding conversation with pt's daughter.  Consider cardiology consult. Pt sees Dr Aundra Dubin - [facility].  Hospital day # 4   Discussed current condition and timing of anticoagulation with patient, daughter and son over the phone.  I explained to them that this is extremely delicate and tricky situation if anticoagulation is started too soon it may increase intracerebral hemorrhage and if he delayed too long she is at risk for embolic strokes.  Plan repeat CT head tomorrow morning and if hemorrhage is resolving may resume IV heparin cautiously warfarin in 7 to 10 days.  Continue strict blood pressure control.  Mobilize out of bed.  Therapy consults.  Add Depakote ER 500 mg daily for headache. This patient is critically ill and at significant risk of neurological worsening, death and care requires constant monitoring of vital signs, hemodynamics,respiratory and cardiac monitoring, extensive review of multiple databases, frequent neurological assessment, discussion with family, other  specialists and medical decision making of high complexity.I have made any additions or clarifications directly to the above note.This critical care time does not reflect procedure time, or teaching time or supervisory time of PA/NP/Med Resident etc but could involve care discussion time.  I spent 30 minutes of neurocritical care time  in the care of  this patient.    Antony Contras, MD  To contact Stroke Continuity provider, please refer to [email or web address] After hours, contact General Neurology

## 2020-08-28 NOTE — Progress Notes (Signed)
Physical Therapy Treatment Patient Details Name: Deborah [name] MRN: [id number] DOB: Jun 20, 1951 Today's Date: 08/28/2020    History of Present Illness The pt is a 70 yo female presenting with severe headache, nausea, and vomiting. 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 PMH includes: chronic afib, gout, HLD, HTN, obesity,    PT Comments    Pt demonstrating improved activity tolerance this session compared to last and progressing towards her physical therapy goals. Requiring two person moderate assist for bed mobility and transfers to standing. Further mobility deferred due to constant nausea, dry heaving and dizziness. Suspect continued steady progress. Will benefit from CIR to address deficits and maximize functional mobility.     Follow Up Recommendations  CIR     Equipment Recommendations  None recommended by PT    Recommendations for Other Services       Precautions / Restrictions Precautions Precautions: Fall Restrictions Weight Bearing Restrictions: No    Mobility  Bed Mobility Overal bed mobility: Needs Assistance Bed Mobility: Supine to Sit;Sit to Supine     Supine to sit: Mod assist;+2 for physical assistance Sit to supine: Mod assist;+2 for physical assistance   General bed mobility comments: Pt initiating bringing BLE's off edge of bed with cueing, assist for trunk to upright. Assist for BLE elevation back into bed  Transfers Overall transfer level: Needs assistance Equipment used: 2 person hand held assist Transfers: Sit to/from Stand Sit to Stand: Mod assist;+2 physical assistance         General transfer comment: ModA + 2 to rise from edge of bed and take several side steps  Ambulation/Gait                 Stairs             Wheelchair Mobility    Modified Rankin (Stroke Patients Only)       Balance Overall balance assessment: Needs assistance Sitting-balance support: Feet  unsupported;Single extremity supported Sitting balance-Leahy Scale: Poor Sitting balance - Comments: reliant on single UE support   Standing balance support: Bilateral upper extremity supported Standing balance-Leahy Scale: Poor Standing balance comment: reliant on HHA                            Cognition Arousal/Alertness: Awake/alert Behavior During Therapy: WFL for tasks assessed/performed Overall Cognitive Status: Within Functional Limits for tasks assessed                                 General Comments: Pt following all commands and answering questions appropriately      Exercises General Exercises - Lower Extremity Long Arc Quad: Both;10 reps;Seated Heel Slides: Both;10 reps;Seated Straight Leg Raises: Both;10 reps;Seated    General Comments        Pertinent Vitals/Pain Pain Assessment: No/denies pain    Home Living                      Prior Function            PT Goals (current goals can now be found in the care plan section) Acute Rehab PT Goals Patient Stated Goal: decreased nausea, dizziness Potential to Achieve Goals: Good Progress towards PT goals: Progressing toward goals    Frequency    Min 4X/week      PT Plan Current plan remains appropriate    Co-evaluation  AM-PAC PT "6 Clicks" Mobility   Outcome Measure  Help needed turning from your back to your side while in a flat bed without using bedrails?: A Little Help needed moving from lying on your back to sitting on the side of a flat bed without using bedrails?: A Lot Help needed moving to and from a bed to a chair (including a wheelchair)?: A Lot Help needed standing up from a chair using your arms (e.g., wheelchair or bedside chair)?: A Lot Help needed to walk in hospital room?: A Lot Help needed climbing 3-5 steps with a railing? : Total 6 Click Score: 12    End of Session Equipment Utilized During Treatment: Gait  belt;Oxygen Activity Tolerance: Other (comment) (limited by dizziness/nausea) Patient left: in bed;with call bell/phone within reach;with family/visitor present;with SCD's reapplied;with bed alarm set Nurse Communication: Mobility status PT Visit Diagnosis: Other abnormalities of gait and mobility (R26.89);Other symptoms and signs involving the nervous system (R29.898)     Time: 6283-1517 PT Time Calculation (min) (ACUTE ONLY): 27 min  Charges:  $Therapeutic Activity: 23-37 mins                     Lillia Pauls, PT, DPT Acute Rehabilitation Services Pager [phone] Office [phone]    Norval Morton 08/28/2020, 2:01 PM

## 2020-08-29 ENCOUNTER — Inpatient Hospital Stay (HOSPITAL_COMMUNITY): Payer: Medicare Other

## 2020-08-29 ENCOUNTER — Encounter (HOSPITAL_COMMUNITY): Payer: Self-pay | Admitting: Student in an Organized Health Care Education/Training Program

## 2020-08-29 DIAGNOSIS — I482 Chronic atrial fibrillation, unspecified: Secondary | ICD-10-CM

## 2020-08-29 DIAGNOSIS — I5032 Chronic diastolic (congestive) heart failure: Secondary | ICD-10-CM

## 2020-08-29 LAB — GLUCOSE, CAPILLARY
Glucose-Capillary: 182 mg/dL — ABNORMAL HIGH (ref 70–99)
Glucose-Capillary: 224 mg/dL — ABNORMAL HIGH (ref 70–99)
Glucose-Capillary: 160 mg/dL — ABNORMAL HIGH (ref 70–99)
Glucose-Capillary: 188 mg/dL — ABNORMAL HIGH (ref 70–99)

## 2020-08-29 LAB — CULTURE, BLOOD (ROUTINE X 2)
Culture: NO GROWTH
Culture: NO GROWTH
Special Requests: ADEQUATE
Special Requests: ADEQUATE

## 2020-08-29 LAB — CBC
HCT: 36.4 % (ref 36.0–46.0)
Hemoglobin: 11.7 g/dL — ABNORMAL LOW (ref 12.0–15.0)
MCH: 23.8 pg — ABNORMAL LOW (ref 26.0–34.0)
MCHC: 32.1 g/dL (ref 30.0–36.0)
MCV: 74.1 fL — ABNORMAL LOW (ref 80.0–100.0)
Platelets: 170 10*3/uL (ref 150–400)
RBC: 4.91 MIL/uL (ref 3.87–5.11)
RDW: 16.3 % — ABNORMAL HIGH (ref 11.5–15.5)
WBC: 7.5 10*3/uL (ref 4.0–10.5)
nRBC: 0.3 % — ABNORMAL HIGH (ref 0.0–0.2)

## 2020-08-29 LAB — BASIC METABOLIC PANEL
Anion gap: 13 (ref 5–15)
BUN: 18 mg/dL (ref 8–23)
CO2: 26 mmol/L (ref 22–32)
Calcium: 9.6 mg/dL (ref 8.9–10.3)
Chloride: 101 mmol/L (ref 98–111)
Creatinine, Ser: 1.17 mg/dL — ABNORMAL HIGH (ref 0.44–1.00)
GFR, Estimated: 51 mL/min — ABNORMAL LOW (ref >60)
Glucose, Bld: 147 mg/dL — ABNORMAL HIGH (ref 70–99)
Potassium: 3.9 mmol/L (ref 3.5–5.1)
Sodium: 140 mmol/L (ref 135–145)

## 2020-08-29 LAB — PROTIME-INR
INR: 1 (ref 0.8–1.2)
Prothrombin Time: 12.8 seconds (ref 11.4–15.2)

## 2020-08-29 LAB — HEPARIN LEVEL (UNFRACTIONATED): Heparin Unfractionated: 0.79 IU/mL — ABNORMAL HIGH (ref 0.30–0.70)

## 2020-08-29 MED ORDER — ROSUVASTATIN CALCIUM 5 MG PO TABS
10.0000 mg | ORAL_TABLET | Freq: Every day | ORAL | Status: DC
  Administered 2020-08-29 – 2020-09-02 (×5): 10 mg via ORAL
  Filled 2020-08-29 (×5): qty 2

## 2020-08-29 MED ORDER — HEPARIN (PORCINE) 25000 UT/250ML-% IV SOLN
850.0000 [IU]/h | INTRAVENOUS | Status: DC
  Administered 2020-08-29: 1000 [IU]/h via INTRAVENOUS
  Filled 2020-08-29: qty 250

## 2020-08-29 NOTE — Progress Notes (Signed)
Inpatient Rehab Admissions Coordinator Note:   Per PT/OT recommendations, pt was screened for CIR candidacy by Wolfgang Phoenix, MS, CCC-SLP.  At this time we are recommending an inpatient rehab consult.  AC will place consult order per protocol.   Please contact me with questions.    Wolfgang Phoenix, MS, CCC-SLP Admissions Coordinator [phone] 08/29/20 10:28 AM

## 2020-08-29 NOTE — Progress Notes (Signed)
Physical Therapy Treatment Patient Details Name: Deborah [name] MRN: [id number] DOB: 1951/05/13 Today's Date: 08/29/2020    History of Present Illness The pt is a 70 yo female presenting with severe headache, nausea, and vomiting. 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 PMH includes: chronic afib, gout, HLD, HTN, obesity,    PT Comments    The pt was able to tolerate session today due to improvement in nausea sx, but does have continued dizziness. She was able to come to sitting EOB with minA of 2 and heavy use of bed rail, but required increased assist to maintain static sitting due to dizziness at this time. The pt decliend attempts at OOB transfers due to dizziness, but was able to tolerate sitting EOB for 10-12 minutes while completing a series of LE exercises for improved ROM and strengthening. The pt will continue to benefit from skilled PT to continue progressing activity tolerance to include OOB transfers and gait training. Continue to recommend CIR.     Follow Up Recommendations  CIR     Equipment Recommendations  None recommended by PT    Recommendations for Other Services       Precautions / Restrictions Precautions Precautions: Fall Precaution Comments: nausea/dizzy with all movements Restrictions Weight Bearing Restrictions: No    Mobility  Bed Mobility Overal bed mobility: Needs Assistance Bed Mobility: Supine to Sit;Sit to Supine     Supine to sit: +2 for physical assistance;Min assist Sit to supine: Mod assist;+2 for physical assistance   General bed mobility comments: pt able to initiate with BLE, modA to pull to sit EOB, heavy use of bed rail and PT assist for elevating trunk off HOB  Transfers                 General transfer comment: pt required totalA to scoot along EOB, but declined multiple offers to attempt standing     Balance Overall balance assessment: Needs assistance Sitting-balance support:  Feet unsupported;Single extremity supported Sitting balance-Leahy Scale: Poor Sitting balance - Comments: posterior lean today, leaning against posterior support from +2 to maintain static sitting and complete LE exercises                                    Cognition Arousal/Alertness: Awake/alert Behavior During Therapy: WFL for tasks assessed/performed Overall Cognitive Status: Within Functional Limits for tasks assessed                                 General Comments: Pt following all commands and answering questions appropriately      Exercises General Exercises - Lower Extremity Ankle Circles/Pumps: AROM;Both;15 reps;Seated Long Arc Quad: Both;10 reps;Seated (10 AROM, 10 against min resistance) Heel Slides: Both;10 reps;Seated (10 AROM, 10 against min resistance) Hip Flexion/Marching: AROM;Both;10 reps;Seated    General Comments        Pertinent Vitals/Pain Pain Assessment: No/denies pain Pain Intervention(s): Monitored during session;Limited activity within patient's tolerance;Premedicated before session;Repositioned           PT Goals (current goals can now be found in the care plan section) Acute Rehab PT Goals Patient Stated Goal: decreased nausea, dizziness PT Goal Formulation: With patient/family Time For Goal Achievement: 09/09/20 Potential to Achieve Goals: Good Progress towards PT goals: Progressing toward goals    Frequency    Min 4X/week  PT Plan Current plan remains appropriate       AM-PAC PT "6 Clicks" Mobility   Outcome Measure  Help needed turning from your back to your side while in a flat bed without using bedrails?: A Little Help needed moving from lying on your back to sitting on the side of a flat bed without using bedrails?: A Lot Help needed moving to and from a bed to a chair (including a wheelchair)?: A Lot Help needed standing up from a chair using your arms (e.g., wheelchair or bedside chair)?:  A Lot Help needed to walk in hospital room?: A Lot Help needed climbing 3-5 steps with a railing? : Total 6 Click Score: 12    End of Session Equipment Utilized During Treatment: Gait belt;Oxygen Activity Tolerance: Patient limited by fatigue (limited by dizziness) Patient left: in bed;with call bell/phone within reach;with family/visitor present;with SCD's reapplied;with bed alarm set Nurse Communication: Mobility status PT Visit Diagnosis: Other abnormalities of gait and mobility (R26.89);Other symptoms and signs involving the nervous system (R29.898)     Time: 1120-1140 PT Time Calculation (min) (ACUTE ONLY): 20 min  Charges:  $Therapeutic Activity: 8-22 mins                     Rolm Baptise, PT, DPT   Acute Rehabilitation Department Pager #: [phone]   Gaetana Michaelis 08/29/2020, 4:59 PM

## 2020-08-29 NOTE — Progress Notes (Addendum)
STROKE TEAM PROGRESS NOTE   INTERVAL HISTORY No acute overnight events. Patient evaluated at bedside with patient's daughter at bedside. Patient still complains of mild headache but states improving with Depakote. Also, states had an episode of dizziness while working with PT yesterday but denies any nausea, or vomiting. Discussion about starting low dose IV heparin drip today and pt and pt's daughter shows fair understanding. They were explained  that patient would be under close observation in ICU while starting the Heparin.Neurological exam stable. Afebrile and BP adequately controlled. CT head from this morning shows stable appearance of the midline cerebellar hemorrhage with slight increase in ventricular size    Vitals:   08/29/20 1144 08/29/20 1200 08/29/20 1300 08/29/20 1400  BP:  111/73 120/70 107/71  Pulse:  83 100 77  Resp:  17 15 19   Temp: 98.5 F (36.9 C)     TempSrc: Oral     SpO2:  93% 93% 92%  Weight:      Height:       CBC:  Recent Labs  Lab 08/24/20 2003 08/24/20 2158 08/28/20 0059 08/29/20 0202  WBC 10.1   < > 7.8 7.5  NEUTROABS 4.4  --   --   --   HGB 11.0*   < > 10.7* 11.7*  HCT 36.3   < > 34.5* 36.4  MCV 76.7*   < > 75.8* 74.1*  PLT 201   < > 158 170   < > = values in this interval not displayed.   Basic Metabolic Panel:  Recent Labs  Lab 08/28/20 0059 08/29/20 0202  NA 140 140  K 4.1 3.9  CL 105 101  CO2 25 26  GLUCOSE 156* 147*  BUN 12 18  CREATININE 1.15* 1.17*  CALCIUM 8.4* 9.6   Lipid Panel:  Recent Labs  Lab 08/24/20 2115  CHOL 150  TRIG 170*  HDL 45  CHOLHDL 3.3  VLDL 34  LDLCALC 71   HgbA1c:  Recent Labs  Lab 08/24/20 2154  HGBA1C 7.5*   Urine Drug Screen: No results for input(s): LABOPIA, COCAINSCRNUR, LABBENZ, AMPHETMU, THCU, LABBARB in the last 168 hours.  Alcohol Level No results for input(s): ETH in the last 168 hours.  IMAGING past 24 hours  DG CHEST PORT 1 VIEW 08/26/2020 IMPRESSION:  1. Marked cardiac  enlargement with pulmonary vascular congestion.  2. Nodular opacity in the LEFT upper chest versus vessel on end. Follow-up PA and lateral chest is suggested to ensure resolution and or CT of the chest on follow-up after resolution of acute process.  3. No lobar consolidative changes. No overt pulmonary edema.  4. Question of LEFT lower lobe atelectasis, attention on follow-up.   ECHOCARDIOGRAM COMPLETE 08/26/2020 IMPRESSIONS   1. Left ventricular ejection fraction, by estimation, is 55 to 60%. The left ventricle has normal function. The left ventricle has no regional wall motion abnormalities. There is mild left ventricular hypertrophy. Left ventricular diastolic parameters are indeterminate.   2. Right ventricular systolic function is mildly reduced. The right ventricular size is normal. There is moderately elevated pulmonary artery systolic pressure. The estimated right ventricular systolic pressure is 45.0 mmHg.   3. Left atrial size was severely dilated.   4. Right atrial size was severely dilated.   5. 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 Mean gradient 9 mmHg but PHT 76 msec (short). Suspect that she does not have hemodynamically significant mitral mitral stenosis and there does not appear to be significant mitral regurgitation.   6. Status  post tricuspid valve repair. Mean gradient 6 mmHg with mild-moderate tricuspid regurgitation.   7. The aortic valve is tricuspid. Aortic valve regurgitation is mild. Mild aortic valve sclerosis is present, with no evidence of aortic valve stenosis.   8. The inferior vena cava is dilated in size with <50% respiratory variability, suggesting right atrial pressure of 15 mmHg.   9. The patient is in atrial fibrillation.   PHYSICAL EXAM   Temp:  [97.5 F (36.4 C)-99.1 F (37.3 C)] 98.5 F (36.9 C) (01/03 1144) Pulse Rate:  [52-101] 77 (01/03 1400) Resp:  [10-21] 19 (01/03 1400) BP: (100-149)/(56-97) 107/71 (01/03 1400) SpO2:  [90 %-98 %] 92 % (01/03  1400)  General -pleasant middle-age Asian [place] lady not in distress  Cardiovascular - irregularly irregular heart rate and rhythm.  Psych: Normal mood and affect  Neuro -awake alert and interactive.  Oriented to time place and person.  No dysarthria and no aphasia, able to name and repeat, follows all simple commands.   Visual field full, no gaze palsy, no significant nystagmus seen on my exam. Facial symmetrical, tongue midline. PERRL. Moving b/l against gravity symmetrical, moving b/l LEs 3/5 proximal and 4/5 distally. Sensation symmetrical. B/l FTN grossly intact without ataxia. Gait not tested.   ASSESSMENT/PLAN Ms. Tyhesha Dutson is a 70 y.o. 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 No source of embolus. LA severely dilated ; mechanical mitral valve ; tricuspid valve repair ; patient was in atrial fibrillation.  LDL 71 HgbA1c 7.5 VTE prophylaxis - None, as starting IV Heparin aspirin 81 mg daily and warfarin daily prior to admission, now on No antithrombotic. Therapy recommendations:  CIR recommended Disposition:  pending  Chronic afib Mechanical MV TV repair On warfarin at home INR 4.3->2.1->kcentra and vitK->1.1 Start IV Heparin @ 1100 units/hr. No bolus, and f/u of Heparin Level after 6 hrs and then daily along with CBC and any signs of bleeding. Pt does have high risk of embolic event with mechanical valve at this time - discussed with daughter and son (phone) CXR - 08/26/20 - Marked cardiac enlargement with pulmonary vascular congestion.  Demadex  PTA - 80 mg Q AM - 60 mg QPM -> resumed 08/26/20 (cardiologist - Larey Dresser, MD - [place] [facility]) Potassium 3.5->3.8  Hypertension Home meds:  Imdur, metoprolol, torsemide Stable Off cleviprex now Continue imdur and metoprolol Continue torsemide and potassium  Long-term BP goal normotensive  Hyperlipidemia Home meds: Crestor 10 LDL 71, goal < 70 Hold off crestor now due to [facility] May consider resuming statin at discharge  Diabetes type II Uncontrolled Home meds:  metformin HgbA1c 7.5, goal < 7.0 CBGs  [facility] PCP close follow up   Other Stroke Risk Factors Advanced Age >/= 62  Obesity, Body mass index is 30.81 kg/m., BMI >/= 30 associated with increased stroke risk, recommend weight loss, diet and exercise as appropriate   Other Active Problems, Findings and Recommendations HA, nausea and vomiting secondary to [place]. Sx management CKD IIIa, Cre 1.45->1.30->1.29->1.15->1.15>1.17 Nodular opacity in the LEFT upper chest versus vessel on end. Follow-up PA and  lateral chest is suggested to ensure resolution and or CT of the chest on follow-up after resolution of acute process. Repeat CXR  08/28/20 - Stable moderate cardiomegaly without pulmonary edema. No active pulmonary disease. Consider Chest CT as per radiology and note from Dr Thomasena Edis regarding conversation with pt's daughter. Consider cardiology consult. Pt sees Dr [name] - [place] [facility].  Arnoldo Lenis, MD PGY-1 Resident   I have personally obtained history,examined this patient, reviewed notes, independently viewed imaging studies, participated in medical decision making and plan of care.ROS completed by me personally and pertinent positives fully documented  I have made any additions or clarifications directly to the above note. Agree with note above. . Long discussion with the patient and daughter regarding her tricky and delicate situation of requiring anticoagulation due to mechanical heart valve versus recent intracerebral  hemorrhage getting worse with anticoagulation.  It has been nearly a week since onset of her stroke symptoms it may be safe to consider cautious anticoagulation.  After discussion of risk benefits plan to start IV heparin drip with close neurological monitoring and keeping level in the low therapeutic range.  Discussed with pharmacist.  Patient is stable and next few days will transfer to the floor.  Wean hypertonic saline drip gradually over the next 24 hours and discontinue This patient is critically ill and at significant risk of neurological worsening, death and care requires constant monitoring of vital signs, hemodynamics,respiratory and cardiac monitoring, extensive review of multiple databases, frequent neurological assessment, discussion with family, other specialists and medical decision making of high complexity.I have made any additions or clarifications directly to the above note.This critical care time does not reflect procedure time, or teaching time or supervisory time of PA/NP/Med Resident etc but could involve care discussion time.  I spent 30 minutes of neurocritical care time  in the care of  this patient.   Delia Heady, MD Medical Director [facility] Pager: [phone] 08/29/2020 3:49 PM   To contact Stroke Continuity provider, please refer to [email or web address]. After hours, contact General Neurology

## 2020-08-29 NOTE — Progress Notes (Signed)
ANTICOAGULATION CONSULT NOTE  Pharmacy Consult for heparin Indication: Mechanical mitral valve  Allergies  Allergen Reactions  . Promethazine Hcl Other (See Comments)    REACTION: made her real shaky    Patient Measurements: Height: 4\' 11"  (149.9 cm) Weight: 69.2 kg (152 lb 8.9 oz) IBW/kg (Calculated) : 43.2 Heparin Dosing Weight: 69.2 kg   Vital Signs: Temp: 97.9 F (36.6 C) (01/03 1557) Temp Source: Oral (01/03 1557) BP: 129/87 (01/03 1803) Pulse Rate: 88 (01/03 1803)  Labs: Recent Labs    08/27/20 0204 08/28/20 0059 08/29/20 0202 08/29/20 1745  HGB 10.0* 10.7* 11.7*  --   HCT 32.2* 34.5* 36.4  --   PLT 153 158 170  --   LABPROT 13.4 13.1 12.8  --   INR 1.1 1.0 1.0  --   HEPARINUNFRC  --   --   --  0.79*  CREATININE 1.15* 1.15* 1.17*  --     Estimated Creatinine Clearance: 39.5 mL/min (A) (by C-G formula based on SCr of 1.17 mg/dL (H)).   Medical History: Past Medical History:  Diagnosis Date  . Allergic rhinitis   . Anemia   . Borderline diabetes   . Carpal tunnel syndrome   . Chronic atrial fibrillation (HCC)    coumadin managed by [facility]. CVRR;  Event montor (7/12-8/12) showed no high rate episodes (patient continuously in atrial fibrillation).   . Chronic diastolic heart failure (HCC)   . Eosinophilia 01/27/2013  . External hemorrhoid   . External hemorrhoids   . Fatigue   . Gout   . 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 07/01/2012  . Hx of cardiovascular stress test    ETT-myoview (7/12): No evidence for ischemia or infarction  . Hyperlipidemia   . Hypertension   . Obesity   . Rheumatic heart disease    Status post mechanical ([employer] Jude) mitral valve replacement and tricuspid valve repair at [facility] in 2002; echo 5/12:   EF 60-65%, mild AI, mitral valve prosthesis with AVA 1.66, severe LAE, moderate RAE, moderate to severe TR, mild increased pulmonary artery systolic pressure;    Adenosine  Cardiolite in 4/09:   No ischemia, EF 66%  . Rotator cuff syndrome     Medications:  Medications Prior to Admission  Medication Sig Dispense Refill Last Dose  . acetaminophen (TYLENOL) 500 MG tablet Take 500-1,000 mg by mouth every 6 (six) hours as needed for fever (pain).   08/24/2020 at Unknown time  . allopurinol (ZYLOPRIM) 300 MG tablet TAKE 1 TABLET BY MOUTH EVERY DAY (Patient taking differently: Take 300 mg by mouth daily.) 90 tablet 3 08/24/2020 at Unknown time  . aspirin 81 MG chewable tablet Chew 81 mg by mouth daily.   08/24/2020 at Unknown time  . cetirizine (ZYRTEC) 10 MG tablet Take 10 mg by mouth daily as needed for allergies.   Past Week  . cyanocobalamin 500 MCG tablet Take 1 tablet (500 mcg total) by mouth daily. 30 tablet 5 08/24/2020 at Unknown time  . cycloSPORINE (RESTASIS) 0.05 % ophthalmic emulsion Place 1 drop into both eyes daily as needed (dry eyes).   Past Month at Unknown time  . diazepam (VALIUM) 5 MG tablet Take 5 mg by mouth every 8 (eight) hours as needed for anxiety.    Past Week at Unknown time  . diclofenac Sodium (VOLTAREN) 1 % GEL APPLY 2 GRAMS TO AFFECTED AREA 4 TIMES A DAY (Patient taking differently: Apply  2 g topically 4 (four) times daily.) 100 g 3 Past Month  . famotidine (PEPCID) 20 MG tablet Take 20 mg by mouth daily.   08/24/2020 at Unknown time  . folic acid (FOLVITE) 1 MG tablet TAKE 1 TABLET BY MOUTH EVERY DAY (Patient taking differently: Take 1 mg by mouth daily.) 90 tablet 3 08/24/2020 at Unknown time  . isosorbide mononitrate (IMDUR) 30 MG 24 hr tablet Take 30 mg by mouth daily.   08/24/2020 at Unknown time  . meclizine (ANTIVERT) 25 MG tablet TAKE 1 TABLET (25 MG TOTAL) BY MOUTH 3 (THREE) TIMES DAILY AS NEEDED FOR DIZZINESS. 30 tablet 0 Past Month at Unknown time  . metFORMIN (GLUCOPHAGE) 500 MG tablet TAKE 1 TABLET (500 MG TOTAL) BY MOUTH 2 (TWO) TIMES DAILY WITH A MEAL. 180 tablet 1 08/24/2020 at Unknown time  . metoprolol succinate  (TOPROL-XL) 50 MG 24 hr tablet TAKE 1 TABLET BY MOUTH EVERY DAY (Patient taking differently: Take 50 mg by mouth daily.) 90 tablet 3 08/24/2020 at 0800  . nitroGLYCERIN (NITROSTAT) 0.4 MG SL tablet Place 0.4 mg under the tongue every 5 (five) minutes as needed for chest pain.   one year ago  . Olopatadine HCl 0.7 % SOLN Place 1 drop into both eyes daily as needed (itching/ irritation).   Past Week  . omeprazole (PRILOSEC) 20 MG capsule TAKE 1 CAPSULE 2 (TWO) TIMES DAILY BEFORE A MEAL. TAKE WITH OMEPRAZOLE 40 MG TO EQUAL 60 MG (Patient taking differently: Take 20 mg by mouth 2 (two) times daily before a meal.) 180 capsule 3 08/24/2020 at Unknown time  . potassium chloride SA (KLOR-CON M20) 20 MEQ tablet Take 3 tablets (60 mEq total) by mouth 2 (two) times daily. 540 tablet 2 08/24/2020 at Unknown time  . rosuvastatin (CRESTOR) 10 MG tablet TAKE 1 TABLET BY MOUTH EVERY DAY (Patient taking differently: Take 10 mg by mouth daily.) 90 tablet 3 08/24/2020 at Unknown time  . torsemide (DEMADEX) 20 MG tablet TAKE 4 TABLETS (80 MG TOTAL) EVERY MORNING AND 3 TABLETS (60 MG TOTAL) EVERY EVENING. (Patient taking differently: Take 60-80 mg by mouth See admin instructions. TAKE 4 TABLETS (80 MG TOTAL) EVERY MORNING AND 3 TABLETS (60 MG TOTAL) EVERY EVENING.) 630 tablet 1 08/24/2020 at Unknown time  . warfarin (COUMADIN) 2 MG tablet TAKE 1/2-1 TABLET BY MOUTH EVERY DAY OR AS DIRECTED BY COUMADIN CLINIC (Patient taking differently: Take 1-2 mg by mouth See admin instructions. Take 2 mg every Tue, Thu, Sat; 1 mg all other days.) 75 tablet 1 08/22/2020 at 6pm  . zinc sulfate 220 (50 Zn) MG capsule Take 220 mg by mouth daily.   08/24/2020 at Unknown time  . ACCU-CHEK SOFTCLIX LANCETS lancets USE AS DIRECTED TO test blood sugar 100 each 6   . glucose blood (ACCU-CHEK AVIVA PLUS) test strip 1 each by Other route as needed for other. Use as needed for symptoms of low or high blood sugars 100 strip 12   . Menthol-Methyl  Salicylate (TIGER BALM LINIMENT EX) Apply 1 application topically daily.       Assessment: Deborah [name] on warfarin at home for h/o mechanical mitral valve presented to the ED on 12/29 with cerebellar vermis hemorrhage. Warfarin was reversed with 33F-PCC and vitamin K and anticoagulation has been on hold since. Pharmacy consulted to resuming IV heparin today per stroke protocol. Of note, patient is currently on SQ heparin for VTE prophylaxis.   Initial heparin level is above goal at 0.79 on  1000 units/h.  Goal of Therapy:  Heparin level 0.3-0.5 units/ml Monitor platelets by anticoagulation protocol: Yes   Plan:  -Reduce heparin to 850 units/h -Recheck heparin level in 8h   Arrie Senate, PharmD, [place], [facility] Clinical Pharmacist [phone] Please check AMION for all [facility] numbers 08/29/2020

## 2020-08-29 NOTE — Consult Note (Signed)
Physical Medicine and Rehabilitation Consult Reason for Consult: Decreased functional ability with severe headache nausea and vomiting Referring Physician: Dr. Pearlean Brownie   HPI: Deborah [name] is a 70 y.o. right-handed female  With history of chronic atrial fibrillation rheumatic heart disease status post mitral valve replacement and tricuspid replacement maintained on chronic Coumadin, hypertension, hyperlipidemia, obesity with BMI 30.81.  Per chart review patient lives with her family.  Independent prior to admission.  1 level home 3 steps to entry.  Independent and active.  She does not drive.  Presented 08/24/2020 with headache nausea vomiting and decreased mobility x2 days.  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  No downward herniation.  CT abdomen pelvis showed large amount of stool no other abnormality identified.  Chest x-ray showed marked cardiac enlargement with pulmonary vascular congestion awaiting plan by follow-up cardiology services.  Admission chemistries hemoglobin 11, blood cultures no growth to date, lactic acid 2.9, INR 2.1, troponin negative.  Chronic Coumadin currently discontinued due to IPH however she was cleared to begin subcutaneous heparin for DVT prophylaxis 08/25/2020, on IV heparin started on 08/29/2020.  Follow-up cranial CT scan 08/25/2020 showed no significant change.  Echocardiogram ejection fraction of 55 to 60% no wall motion abnormalities.  Currently on a regular diet.  Due to patient decrease in functional mobility therapy evaluations completed with recommendations of physical medicine rehab consult.  Daughter at bedside, daughter states she is able to help post discharge  Review of Systems  Constitutional: Positive for malaise/fatigue. Negative for chills and fever.  HENT: Negative for hearing loss.   Eyes: Negative for blurred vision and double vision.   Respiratory: Negative for cough and shortness of breath.   Cardiovascular: Positive for palpitations and leg swelling. Negative for chest pain.  Gastrointestinal: Positive for constipation, nausea and vomiting.  Genitourinary: Negative for dysuria and hematuria.  Musculoskeletal: Positive for myalgias.  Skin: Negative for rash.  Neurological: Positive for dizziness and headaches.  All other systems reviewed and are negative.  Past Medical History:  Diagnosis Date  . Allergic rhinitis   . Anemia   . Borderline diabetes   . Carpal tunnel syndrome   . Chronic atrial fibrillation (HCC)    coumadin managed by [facility]. CVRR;  Event montor (7/12-8/12) showed no high rate episodes (patient continuously in atrial fibrillation).   . Chronic diastolic heart failure (HCC)   . Eosinophilia 01/27/2013  . External hemorrhoid   . External hemorrhoids   . Fatigue   . Gout   . 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 Gaylyn Rong  . Hx of cardiovascular stress test    ETT-myoview (7/12): No evidence for ischemia or infarction  . Hyperlipidemia   . Hypertension   . Obesity   . Rheumatic heart disease    Status post mechanical ([employer]. Jude) mitral valve replacement and tricuspid valve repair at [facility] in 2002; echo 5/12:   EF 60-65%, mild AI, mitral valve prosthesis with AVA 1.66, severe LAE, moderate RAE, moderate to severe TR, mild increased pulmonary artery systolic pressure;    Adenosine Cardiolite in 4/09:   No ischemia, EF 66%  . Rotator cuff syndrome    Past Surgical History:  Procedure Laterality Date  . CARDIAC CATHETERIZATION     EF 55%  . TRICUSPID VALVE REPAIR     Family History  Problem Relation Age of Onset  .  Stroke Mother   . Diabetes Mother   . Heart failure Mother        pacemaker  . Heart disease Father        "heart problems"  . Heart disease Sister   . Heart disease Brother   . Gout Brother   . Diabetes Brother   . Stroke  Brother   . Valvular heart disease Sister    Social History:  reports that she has never smoked. She has never used smokeless tobacco. She reports that she does not drink alcohol and does not use drugs. Allergies:  Allergies  Allergen Reactions  . Promethazine Hcl Other (See Comments)    REACTION: made her real shaky   Medications Prior to Admission  Medication Sig Dispense Refill  . acetaminophen (TYLENOL) 500 MG tablet Take 500-1,000 mg by mouth every 6 (six) hours as needed for fever (pain).    Marland Kitchen allopurinol (ZYLOPRIM) 300 MG tablet TAKE 1 TABLET BY MOUTH EVERY DAY (Patient taking differently: Take 300 mg by mouth daily.) 90 tablet 3  . aspirin 81 MG chewable tablet Chew 81 mg by mouth daily.    . cetirizine (ZYRTEC) 10 MG tablet Take 10 mg by mouth daily as needed for allergies.    . cyanocobalamin 500 MCG tablet Take 1 tablet (500 mcg total) by mouth daily. 30 tablet 5  . cycloSPORINE (RESTASIS) 0.05 % ophthalmic emulsion Place 1 drop into both eyes daily as needed (dry eyes).    . diazepam (VALIUM) 5 MG tablet Take 5 mg by mouth every 8 (eight) hours as needed for anxiety.     . diclofenac Sodium (VOLTAREN) 1 % GEL APPLY 2 GRAMS TO AFFECTED AREA 4 TIMES A DAY (Patient taking differently: Apply 2 g topically 4 (four) times daily.) 100 g 3  . famotidine (PEPCID) 20 MG tablet Take 20 mg by mouth daily.    . folic acid (FOLVITE) 1 MG tablet TAKE 1 TABLET BY MOUTH EVERY DAY (Patient taking differently: Take 1 mg by mouth daily.) 90 tablet 3  . isosorbide mononitrate (IMDUR) 30 MG 24 hr tablet Take 30 mg by mouth daily.    . meclizine (ANTIVERT) 25 MG tablet TAKE 1 TABLET (25 MG TOTAL) BY MOUTH 3 (THREE) TIMES DAILY AS NEEDED FOR DIZZINESS. 30 tablet 0  . metFORMIN (GLUCOPHAGE) 500 MG tablet TAKE 1 TABLET (500 MG TOTAL) BY MOUTH 2 (TWO) TIMES DAILY WITH A MEAL. 180 tablet 1  . metoprolol succinate (TOPROL-XL) 50 MG 24 hr tablet TAKE 1 TABLET BY MOUTH EVERY DAY (Patient taking differently:  Take 50 mg by mouth daily.) 90 tablet 3  . nitroGLYCERIN (NITROSTAT) 0.4 MG SL tablet Place 0.4 mg under the tongue every 5 (five) minutes as needed for chest pain.    Marland Kitchen Olopatadine HCl 0.7 % SOLN Place 1 drop into both eyes daily as needed (itching/ irritation).    Marland Kitchen omeprazole (PRILOSEC) 20 MG capsule TAKE 1 CAPSULE 2 (TWO) TIMES DAILY BEFORE A MEAL. TAKE WITH OMEPRAZOLE 40 MG TO EQUAL 60 MG (Patient taking differently: Take 20 mg by mouth 2 (two) times daily before a meal.) 180 capsule 3  . potassium chloride SA (KLOR-CON M20) 20 MEQ tablet Take 3 tablets (60 mEq total) by mouth 2 (two) times daily. 540 tablet 2  . rosuvastatin (CRESTOR) 10 MG tablet TAKE 1 TABLET BY MOUTH EVERY DAY (Patient taking differently: Take 10 mg by mouth daily.) 90 tablet 3  . torsemide (DEMADEX) 20 MG tablet TAKE 4 TABLETS (  80 MG TOTAL) EVERY MORNING AND 3 TABLETS (60 MG TOTAL) EVERY EVENING. (Patient taking differently: Take 60-80 mg by mouth See admin instructions. TAKE 4 TABLETS (80 MG TOTAL) EVERY MORNING AND 3 TABLETS (60 MG TOTAL) EVERY EVENING.) 630 tablet 1  . warfarin (COUMADIN) 2 MG tablet TAKE 1/2-1 TABLET BY MOUTH EVERY DAY OR AS DIRECTED BY COUMADIN CLINIC (Patient taking differently: Take 1-2 mg by mouth See admin instructions. Take 2 mg every Tue, Thu, Sat; 1 mg all other days.) 75 tablet 1  . zinc sulfate 220 (50 Zn) MG capsule Take 220 mg by mouth daily.    Marland Kitchen ACCU-CHEK SOFTCLIX LANCETS lancets USE AS DIRECTED TO test blood sugar 100 each 6  . glucose blood (ACCU-CHEK AVIVA PLUS) test strip 1 each by Other route as needed for other. Use as needed for symptoms of low or high blood sugars 100 strip 12  . Menthol-Methyl Salicylate (TIGER BALM LINIMENT EX) Apply 1 application topically daily.      Home: Home Living Family/patient expects to be discharged to:: Private residence Living Arrangements: Children,Spouse/significant other Available Help at Discharge: Family,Available 24 hours/day Type of Home:  House Home Access: Stairs to enter [employer] of Steps: front 3; back 9 Entrance Stairs-Rails: None (front steps) Home Layout: One level Bathroom Shower/Tub: Engineer, manufacturing systems: Standard Home Equipment: None  Functional History: Prior Function Level of Independence: Independent Comments: ADLs and IADLs. Enjoys walking for exercises. Does not drive Functional Status:  Mobility: Bed Mobility Overal bed mobility: Needs Assistance Bed Mobility: Supine to Sit,Sit to Supine Rolling: Mod assist Supine to sit: Mod assist,+2 for physical assistance Sit to supine: Mod assist,+2 for physical assistance General bed mobility comments: Pt initiating bringing BLE's off edge of bed with cueing, assist for trunk to upright. Assist for BLE elevation back into bed Transfers Overall transfer level: Needs assistance Equipment used: 2 person hand held assist Transfers: Sit to/from Stand Sit to Stand: Mod assist,+2 physical assistance General transfer comment: ModA + 2 to rise from edge of bed and take several side steps      ADL: ADL Overall ADL's : Needs assistance/impaired Eating/Feeding: Total assistance Eating/Feeding Details (indicate cue type and reason): daughter total (A) bites of grits this morning per daughter then becoming nauseated Grooming: Maximal assistance,Bed level Upper Body Bathing: Maximal assistance Lower Body Bathing: Total assistance General ADL Comments: limited to bed level due to nauseated. Pt gagging but never vomits  Cognition: Cognition Overall Cognitive Status: Within Functional Limits for tasks assessed Arousal/Alertness:  (awake) Orientation Level: Oriented X4 Attention: Sustained Sustained Attention: Appears intact Memory: Appears intact Awareness: Appears intact Problem Solving: Appears intact Safety/Judgment: Appears intact Cognition Arousal/Alertness: Awake/alert Behavior During Therapy: WFL for tasks  assessed/performed Overall Cognitive Status: Within Functional Limits for tasks assessed General Comments: Pt following all commands and answering questions appropriately  Blood pressure 130/86, pulse (!) 52, temperature 98.1 F (36.7 C), temperature source Oral, resp. rate 17, height 4\' 11"  (1.499 m), weight 69.2 kg, SpO2 94 %. Physical Exam Vitals and nursing note reviewed.  Constitutional:      Appearance: She is normal weight.  HENT:     Head: Normocephalic and atraumatic.  Eyes:     Extraocular Movements: Extraocular movements intact.     Conjunctiva/sclera: Conjunctivae normal.     Pupils: Pupils are equal, round, and reactive to light.  Cardiovascular:     Rate and Rhythm: Normal rate and regular rhythm.     Heart sounds: S1 normal. No murmur  heard.     Comments: Valve click during diastole Pulmonary:     Effort: Pulmonary effort is normal. No respiratory distress.     Breath sounds: Normal breath sounds.  Abdominal:     General: Abdomen is flat. Bowel sounds are normal. There is no distension.     Palpations: Abdomen is soft.  Neurological:     Mental Status: She is alert and oriented to person, place, and time.     Cranial Nerves: Cranial nerves are intact. No dysarthria.     Motor: No tremor or abnormal muscle tone.     Coordination: Rapid alternating movements normal.     Comments: Patient is lethargic but arousable.  Makes eye contact with examiner.  Speech was low tone but she was able to provide her name and follow simple commands. Intact finger-nose-finger testing Poor sitting balance Motor strength is 5/5 bilateral deltoid bicep tricep grip hip flexor knee extensor ankle dorsiflexor  Psychiatric:        Attention and Perception: Attention normal.        Mood and Affect: Affect is blunt.        Speech: Speech is delayed.        Behavior: Behavior is slowed.     Results for orders placed or performed during the hospital encounter of 08/24/20 (from the past 24  hour(s))  Glucose, capillary     Status: Abnormal   Collection Time: 08/28/20 11:45 AM  Result Value Ref Range   Glucose-Capillary 217 (H) 70 - 99 mg/dL  Glucose, capillary     Status: Abnormal   Collection Time: 08/28/20  3:43 PM  Result Value Ref Range   Glucose-Capillary 139 (H) 70 - 99 mg/dL  Glucose, capillary     Status: Abnormal   Collection Time: 08/28/20  9:19 PM  Result Value Ref Range   Glucose-Capillary 187 (H) 70 - 99 mg/dL  Protime-INR     Status: None   Collection Time: 08/29/20  2:02 AM  Result Value Ref Range   Prothrombin Time 12.8 11.4 - 15.2 seconds   INR 1.0 0.8 - 1.2  CBC     Status: Abnormal   Collection Time: 08/29/20  2:02 AM  Result Value Ref Range   WBC 7.5 4.0 - 10.5 K/uL   RBC 4.91 3.87 - 5.11 MIL/uL   Hemoglobin 11.7 (L) 12.0 - 15.0 g/dL   HCT 28.436.4 13.236.0 - 44.046.0 %   MCV 74.1 (L) 80.0 - 100.0 fL   MCH 23.8 (L) 26.0 - 34.0 pg   MCHC 32.1 30.0 - 36.0 g/dL   RDW 10.216.3 (H) 72.511.5 - 36.615.5 %   Platelets 170 150 - 400 K/uL   nRBC 0.3 (H) 0.0 - 0.2 %  Basic metabolic panel     Status: Abnormal   Collection Time: 08/29/20  2:02 AM  Result Value Ref Range   Sodium 140 135 - 145 mmol/L   Potassium 3.9 3.5 - 5.1 mmol/L   Chloride 101 98 - 111 mmol/L   CO2 26 22 - 32 mmol/L   Glucose, Bld 147 (H) 70 - 99 mg/dL   BUN 18 8 - 23 mg/dL   Creatinine, Ser 4.401.17 (H) 0.44 - 1.00 mg/dL   Calcium 9.6 8.9 - 34.710.3 mg/dL   GFR, Estimated 51 (L) >60 mL/min   Anion gap 13 5 - 15  Glucose, capillary     Status: Abnormal   Collection Time: 08/29/20  7:56 AM  Result Value Ref Range   Glucose-Capillary  182 (H) 70 - 99 mg/dL   CT HEAD WO CONTRAST  Result Date: 08/29/2020 CLINICAL DATA:  Intracranial hemorrhage follow up EXAM: CT HEAD WITHOUT CONTRAST TECHNIQUE: Contiguous axial images were obtained from the base of the skull through the vertex without intravenous contrast. COMPARISON:  08/25/2020 FINDINGS: Brain: Unchanged focus of hemorrhage in the cerebellar vermis. There is  periventricular hypoattenuation compatible with chronic microvascular disease. No midline shift or hydrocephalus. No significant mass effect. Vascular: No abnormal hyperdensity of the major intracranial arteries or dural venous sinuses. No intracranial atherosclerosis. Skull: The visualized skull base, calvarium and extracranial soft tissues are normal. Sinuses/Orbits: No fluid levels or advanced mucosal thickening of the visualized paranasal sinuses. No mastoid or middle ear effusion. The orbits are normal. IMPRESSION: Unchanged focus of hemorrhage in the cerebellar vermis. Electronically Signed   By: Deatra Robinson M.D.   On: 08/29/2020 03:53   DG Chest Port 1 View  Result Date: 08/28/2020 CLINICAL DATA:  Fluid overload EXAM: PORTABLE CHEST 1 VIEW COMPARISON:  08/26/2020 chest radiograph. FINDINGS: Intact sternotomy wires. Stable cardiomediastinal silhouette with moderate cardiomegaly. No pneumothorax. No significant pleural effusions. No pulmonary edema. Previously visualized nodular upper left perihilar opacity has resolved. No acute consolidative airspace disease. IMPRESSION: Stable moderate cardiomegaly without pulmonary edema. No active pulmonary disease. Electronically Signed   By: Delbert Phenix M.D.   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 Does the need for close, 24 hr/day medical supervision in concert with the patient's rehab needs make it unreasonable for this patient to be served in a less intensive setting? Yes 2. Co-Morbidities requiring supervision/potential complications: Hypertension, mitral valve replacement on chronic Coumadin 3. Due to bladder management, bowel management, safety, skin/wound care, disease management, medication administration, pain management and patient education, does the patient require 24 hr/day rehab nursing? Yes 4. Does the patient require coordinated care of a physician, rehab nurse,  therapy disciplines of PT, OT, speech to address physical and functional deficits in the context of the above medical diagnosis(es)? Yes Addressing deficits in the following areas: balance, endurance, locomotion, strength, transferring, bowel/bladder control, bathing, dressing, feeding, grooming, toileting, cognition and psychosocial support 5. Can the patient actively participate in an intensive therapy program of at least 3 hrs of therapy per day at least 5 days per week? Yes 6. The potential for patient to make measurable gains while on inpatient rehab is good 7. Anticipated functional outcomes upon discharge from inpatient rehab are supervision  with PT, min assist and mod assist with OT, supervision with SLP. 8. Estimated rehab length of stay to reach the above functional goals is: 14 to 18 days 9. Anticipated discharge destination: Home 10. Overall Rehab/Functional Prognosis: good  RECOMMENDATIONS: This patient's condition is appropriate for continued rehabilitative care in the following setting: CIR Patient has agreed to participate in recommended program. Yes Note that insurance prior authorization may be required for reimbursement for recommended care.  Comment: Discussed with daughter that goals are not back to baseline after inpatient rehabilitation.  Discussed balance can take 6 months before improvements plateau.  Will need to be monitored in ICU setting while IV heparin is reintroduced.   Mcarthur Rossetti Angiulli, PA-C 08/29/2020   "I have personally performed a face to face diagnostic evaluation of this patient.  Additionally, I have reviewed and concur with the physician assistant's documentation above." Erick Colace M.D. [facility] FAAPM&R (Neuromuscular Med) Diplomate Am Board of Electrodiagnostic Med Fellow  Am Board of Interventional Pain

## 2020-08-29 NOTE — Consult Note (Addendum)
Advanced Heart Failure Team Consult Note   Primary Physician: Deborah Bock, DO PCP-Cardiologist:  Dr Deborah [name] Reason for Consultation:  Atrial Fibrillation/heart Failure   HPI:    Deborah [name] is seen today for evaluation of  A fib/Heart Failure at the request of Dr Deborah [name]. Daughter requested consult.   [name] is a 70 year old with history of rheumatic heart disease and MV replacement/TV repair 2002 at [facility], aortic insufficiency , gout, CAD, chronic atrial fibrillation, and chronic diastolic CHF.  Echo in 4/18 showed EF 60-65% with normally functioning mechanical mitral valve.  She was admitted in 8/20 with atypical chest pain and mild volume overload (main complaint was chest pain).  Troponin negative.  Coronary CTA was done, showing nonobstructive mild CAD.  On 12/27 INR 4.3 and she was instructed to hold coumadin but accidentally took coumadin that night. She told her daughter and the next day she tried to eat greens to help lower INR. On 12/29 she was cooking and developed N/V and headache. Her daughter called EMS.   Presented [facility] 12/29 via EMS with nausea, vomiting, abdominal pain. INR was supra therapeutic on 12/27 at 4.3. INR on admit 2.1 Taken for CT of head and this showed hemorrhage in cerebellum . Neurology consulted. Anticoagulants on hold. Given IV fluids. Torsemide was restarted 08/29/19. PT/OT/Speech consulted. Able to take some pos.   Feeling a little better today. Still having some dizziness and mild headache.    ECHO 08/26/2020  1. Left ventricular ejection fraction, by estimation, is 55 to 60%. The left ventricle has normal function. The left ventricle has no regional wall motion abnormalities. There is mild left ventricular hypertrophy. Left ventricular diastolic parameters are indeterminate.   2. Right ventricular systolic function is mildly reduced. The right ventricular size is normal. There is moderately elevated pulmonary artery systolic pressure.  The estimated right ventricular systolic pressure is 45.0 mmHg.   3. Left atrial size was severely dilated.   4. Right atrial size was severely dilated.   5. 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 Mean gradient 9 mmHg but PHT 76 msec (short). Suspect that she does not have hemodynamically significant mitral mitral stenosis and there does not appear to be significant mitral regurgitation.   6. Status post tricuspid valve repair. Mean gradient 6 mmHg with mild-moderate tricuspid regurgitation.   7. The aortic valve is tricuspid. Aortic valve regurgitation is mild. Mild aortic valve sclerosis is present, with no evidence of aortic valve stenosis.   8. The inferior vena cava is dilated in size with <50% respiratory variability, suggesting right atrial pressure of 15 mmHg.   9. The patient is in atrial fibrillation.  Review of Systems: [y] = yes, [ ]  = no   . General: Weight gain [ ] ; Weight loss [ ] ; Anorexia [ ] ; Fatigue [ Y]; Fever [ ] ; Chills [ ] ; Weakness [Y ]  . Cardiac: Chest pain/pressure [ ] ; Resting SOB [ ] ; Exertional SOB [ ] ; Orthopnea [ ] ; Pedal Edema [ ] ; Palpitations [ ] ; Syncope [ ] ; Presyncope [ ] ; Paroxysmal nocturnal dyspnea[ ]   . Pulmonary: Cough [ ] ; Wheezing[ ] ; Hemoptysis[ ] ; Sputum [ ] ; Snoring [ ]   . GI: Vomiting[ ] ; Dysphagia[ ] ; Melena[ ] ; Hematochezia [ ] ; Heartburn[ ] ; Abdominal pain [ ] ; Constipation [ ] ; Diarrhea [ ] ; BRBPR [ ]   . GU: Hematuria[ ] ; Dysuria [ ] ; Nocturia[ ]   . Vascular: Pain in legs with walking [ ] ; Pain in feet with  lying flat [ ] ; Non-healing sores [ ] ; Stroke [ ] ; TIA [ ] ; Slurred speech [ ] ;  . Neuro: Headaches[Y ]; Vertigo[ ] ; Seizures[ ] ; Paresthesias[ ] ;Blurred vision [ ] ; Diplopia [ ] ; Vision changes [ ]   . Ortho/Skin: Arthritis [ ] ; Joint pain [ ] ; Muscle pain [ ] ; Joint swelling [ ] ; Back Pain [ ] ; Rash [ ]   . Psych: Depression[ ] ; Anxiety[ ]   . Heme: Bleeding problems [ ] ; Clotting disorders [ ] ; Anemia [ ]   . Endocrine: Diabetes [ ] ; Thyroid  dysfunction[ ]   Home Medications Prior to Admission medications   Medication Sig Start Date End Date Taking? Authorizing Provider  acetaminophen (TYLENOL) 500 MG tablet Take 500-1,000 mg by mouth every 6 (six) hours as needed for fever (pain).   Yes [provider]  allopurinol (ZYLOPRIM) 300 MG tablet TAKE 1 TABLET BY MOUTH EVERY DAY Patient taking differently: Take 300 mg by mouth daily. 03/23/20  Yes Bensimhon, , MD  aspirin 81 MG chewable tablet Chew 81 mg by mouth daily.   Yes [provider]  cetirizine (ZYRTEC) 10 MG tablet Take 10 mg by mouth daily as needed for allergies.   Yes [provider]  cyanocobalamin 500 MCG tablet Take 1 tablet (500 mcg total) by mouth daily. 08/13/14  Yes , MD  cycloSPORINE (RESTASIS) 0.05 % ophthalmic emulsion Place 1 drop into both eyes daily as needed (dry eyes).   Yes [provider]  diazepam (VALIUM) 5 MG tablet Take 5 mg by mouth every 8 (eight) hours as needed for anxiety.  12/17/14  Yes [provider]  diclofenac Sodium (VOLTAREN) 1 % GEL APPLY 2 GRAMS TO AFFECTED AREA 4 TIMES A DAY Patient taking differently: Apply 2 g topically 4 (four) times daily. 03/23/20  Yes Anderson, Chelsey L, DO  famotidine (PEPCID) 20 MG tablet Take 20 mg by mouth daily.   Yes [provider]  folic acid (FOLVITE) 1 MG tablet TAKE 1 TABLET BY MOUTH EVERY DAY Patient taking differently: Take 1 mg by mouth daily. 03/28/20  Yes Bensimhon, , MD  isosorbide mononitrate (IMDUR) 30 MG 24 hr tablet Take 30 mg by mouth daily.   Yes [provider]  meclizine (ANTIVERT) 25 MG tablet TAKE 1 TABLET (25 MG TOTAL) BY MOUTH 3 (THREE) TIMES DAILY AS NEEDED FOR DIZZINESS. 05/13/19  Yes Bensimhon, , MD  metFORMIN (GLUCOPHAGE) 500 MG tablet TAKE 1 TABLET (500 MG TOTAL) BY MOUTH 2 (TWO) TIMES DAILY WITH A MEAL. 05/19/20  Yes Anderson, Chelsey L, DO  metoprolol succinate (TOPROL-XL) 50 MG 24 hr tablet  TAKE 1 TABLET BY MOUTH EVERY DAY Patient taking differently: Take 50 mg by mouth daily. 12/25/19  Yes , MD  nitroGLYCERIN (NITROSTAT) 0.4 MG SL tablet Place 0.4 mg under the tongue every 5 (five) minutes as needed for chest pain.   Yes [provider]  Olopatadine HCl 0.7 % SOLN Place 1 drop into both eyes daily as needed (itching/ irritation).   Yes [provider]  omeprazole (PRILOSEC) 20 MG capsule TAKE 1 CAPSULE 2 (TWO) TIMES DAILY BEFORE A MEAL. TAKE WITH OMEPRAZOLE 40 MG TO EQUAL 60 MG Patient taking differently: Take 20 mg by mouth 2 (two) times daily before a meal. 03/28/20  Yes , MD  potassium chloride SA (KLOR-CON M20) 20 MEQ tablet Take 3 tablets (60 mEq total) by mouth 2 (two) times daily. 05/16/20  Yes , MD  rosuvastatin (  CRESTOR) 10 MG tablet TAKE 1 TABLET BY MOUTH EVERY DAY Patient taking differently: Take 10 mg by mouth daily. 03/22/20  Yes Laurey Morale, MD  torsemide (DEMADEX) 20 MG tablet TAKE 4 TABLETS (80 MG TOTAL) EVERY MORNING AND 3 TABLETS (60 MG TOTAL) EVERY EVENING. Patient taking differently: Take 60-80 mg by mouth See admin instructions. TAKE 4 TABLETS (80 MG TOTAL) EVERY MORNING AND 3 TABLETS (60 MG TOTAL) EVERY EVENING. 07/27/20  Yes Laurey Morale, MD  warfarin (COUMADIN) 2 MG tablet TAKE 1/2-1 TABLET BY MOUTH EVERY DAY OR AS DIRECTED BY COUMADIN CLINIC Patient taking differently: Take 1-2 mg by mouth See admin instructions. Take 2 mg every Tue, Thu, Sat; 1 mg all other days. 07/27/20  Yes Laurey Morale, MD  zinc sulfate 220 (50 Zn) MG capsule Take 220 mg by mouth daily.   Yes [provider]  ACCU-CHEK SOFTCLIX LANCETS lancets USE AS DIRECTED TO test blood sugar 09/26/17   Freddrick March, MD  glucose blood (ACCU-CHEK AVIVA PLUS) test strip 1 each by Other route as needed for other. Use as needed for symptoms of low or high blood sugars 08/15/20   Anderson, Chelsey L, DO  Menthol-Methyl  Salicylate (TIGER BALM LINIMENT EX) Apply 1 application topically daily.    [provider]    Past Medical History: Past Medical History:  Diagnosis Date  . Allergic rhinitis   . Anemia   . Borderline diabetes   . Carpal tunnel syndrome   . Chronic atrial fibrillation (HCC)    coumadin managed by [facility]. CVRR;  Event montor (7/12-8/12) showed no high rate episodes (patient continuously in atrial fibrillation).   . Chronic diastolic heart failure (HCC)   . Eosinophilia 01/27/2013  . External hemorrhoid   . External hemorrhoids   . Fatigue   . Gout   . 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 Gaylyn Rong  . Hx of cardiovascular stress test    ETT-myoview (7/12): No evidence for ischemia or infarction  . Hyperlipidemia   . Hypertension   . Obesity   . Rheumatic heart disease    Status post mechanical ([employer]) mitral valve replacement and tricuspid valve repair at [facility] in 2002; echo 5/12:   EF 60-65%, mild AI, mitral valve prosthesis with AVA 1.66, severe LAE, moderate RAE, moderate to severe TR, mild increased pulmonary artery systolic pressure;    Adenosine Cardiolite in 4/09:   No ischemia, EF 66%  . Rotator cuff syndrome     Past Surgical History: Past Surgical History:  Procedure Laterality Date  . CARDIAC CATHETERIZATION     EF 55%  . TRICUSPID VALVE REPAIR      Family History: Family History  Problem Relation Age of Onset  . Stroke Mother   . Diabetes Mother   . Heart failure Mother        pacemaker  . Heart disease Father        "heart problems"  . Heart disease Sister   . Heart disease Brother   . Gout Brother   . Diabetes Brother   . Stroke Brother   . Valvular heart disease Sister     Social History: Social History   Socioeconomic History  . Marital status: Married    Spouse name: Not on file  . Number of children: Not on file  . Years of education: Not on file  . Highest education level:  Not on file  Occupational History  . Not on file  Tobacco Use  . Smoking status: Never Smoker  . Smokeless tobacco: Never Used  Substance and Sexual Activity  . Alcohol use: No  . Drug use: No  . Sexual activity: Not on file  Other Topics Concern  . Not on file  Social History Narrative  . Not on file   Social Determinants of Health   Financial Resource Strain: Not on file  Food Insecurity: Not on file  Transportation Needs: Not on file  Physical Activity: Not on file  Stress: Not on file  Social Connections: Not on file    Allergies:  Allergies  Allergen Reactions  . Promethazine Hcl Other (See Comments)    REACTION: made her real shaky    Objective:    Vital Signs:   Temp:  [97.5 F (36.4 C)-99.1 F (37.3 C)] 98.1 F (36.7 C) (01/03 0800) Pulse Rate:  [52-171] 52 (01/03 0800) Resp:  [10-22] 17 (01/03 0800) BP: (100-156)/(56-93) 130/86 (01/03 0800) SpO2:  [90 %-98 %] 94 % (01/03 0800) Last BM Date: 08/26/21  Weight change: Filed Weights   08/24/20 1955 08/25/20 0305  Weight: 67.8 kg 69.2 kg    Intake/Output:   Intake/Output Summary (Last 24 hours) at 08/29/2020 0944 Last data filed at 08/29/2020 0800 Gross per 24 hour  Intake 2521.5 ml  Output 3450 ml  Net -928.5 ml      Physical Exam    General:  No resp difficulty HEENT: normal Neck: supple. JVP 7-8 . Carotids 2+ bilat; no bruits. No lymphadenopathy or thyromegaly appreciated. Cor: PMI nondisplaced. Irregular rate & rhythm. No rubs, gallops or murmurs. Lungs: clear Abdomen: soft, nontender, nondistended. No hepatosplenomegaly. No bruits or masses. Good bowel sounds. Extremities: no cyanosis, clubbing, rash, edema Neuro: alert & orientedx3, cranial nerves grossly intact. moves all 4 extremities w/o difficulty. Affect flat    Telemetry    A Fib 80-90s   EKG     A Fib 80 bpm   Labs   Basic Metabolic Panel: Recent Labs  Lab 08/24/20 2115 08/24/20 2158 08/26/20 0714 08/27/20 0204  08/28/20 0059 08/29/20 0202  NA 137 139 141 141 140 140  K 4.3 4.7 3.5 3.8 4.1 3.9  CL 103 103 106 106 105 101  CO2 24  --  23 24 25 26   GLUCOSE 188* 223* 179* 164* 156* 147*  BUN 29* 40* 23 15 12 18   CREATININE 1.45* 1.30* 1.29* 1.15* 1.15* 1.17*  CALCIUM 8.1*  --  8.1* 8.0* 8.4* 9.6    Liver Function Tests: Recent Labs  Lab 08/24/20 2115  AST 37  ALT 22  ALKPHOS 69  BILITOT 0.9  PROT 6.5  ALBUMIN 3.6   Recent Labs  Lab 08/24/20 2115  LIPASE 47   No results for input(s): AMMONIA in the last 168 hours.  CBC: Recent Labs  Lab 08/24/20 2003 08/24/20 2158 08/26/20 0714 08/27/20 0204 08/28/20 0059 08/29/20 0202  WBC 10.1  --  9.1 8.2 7.8 7.5  NEUTROABS 4.4  --   --   --   --   --   HGB 11.0* 12.6 9.7* 10.0* 10.7* 11.7*  HCT 36.3 37.0 32.1* 32.2* 34.5* 36.4  MCV 76.7*  --  76.2* 76.5* 75.8* 74.1*  PLT 201  --  168 153 158 170    Cardiac Enzymes: No results for input(s): CKTOTAL, CKMB, CKMBINDEX, TROPONINI in the last 168 hours.  BNP: BNP (last 3 results) No results for input(s): BNP in the  last 8760 hours.  ProBNP (last 3 results) No results for input(s): PROBNP in the last 8760 hours.   CBG: Recent Labs  Lab 08/28/20 0853 08/28/20 1145 08/28/20 1543 08/28/20 2119 08/29/20 0756  GLUCAP 199* 217* 139* 187* 182*    Coagulation Studies: Recent Labs    08/27/20 0204 08/28/20 0059 08/29/20 0202  LABPROT 13.4 13.1 12.8  INR 1.1 1.0 1.0     Imaging   CT HEAD WO CONTRAST  Result Date: 08/29/2020 CLINICAL DATA:  Intracranial hemorrhage follow up EXAM: CT HEAD WITHOUT CONTRAST TECHNIQUE: Contiguous axial images were obtained from the base of the skull through the vertex without intravenous contrast. COMPARISON:  08/25/2020 FINDINGS: Brain: Unchanged focus of hemorrhage in the cerebellar vermis. There is periventricular hypoattenuation compatible with chronic microvascular disease. No midline shift or hydrocephalus. No significant mass effect.  Vascular: No abnormal hyperdensity of the major intracranial arteries or dural venous sinuses. No intracranial atherosclerosis. Skull: The visualized skull base, calvarium and extracranial soft tissues are normal. Sinuses/Orbits: No fluid levels or advanced mucosal thickening of the visualized paranasal sinuses. No mastoid or middle ear effusion. The orbits are normal. IMPRESSION: Unchanged focus of hemorrhage in the cerebellar vermis. Electronically Signed   By: Ulyses Jarred M.D.   On: 08/29/2020 03:53      Medications:     Current Medications: . Chlorhexidine Gluconate Cloth  6 each Topical Daily  . divalproex  500 mg Oral Daily  . heparin injection (subcutaneous)  5,000 Units Subcutaneous Q8H  . insulin aspart  0-9 Units Subcutaneous TID WC  . isosorbide mononitrate  30 mg Oral Daily  . metoprolol succinate  50 mg Oral Daily  . pantoprazole (PROTONIX) IV  40 mg Intravenous QHS  . potassium chloride  40 mEq Oral BID  . senna-docusate  1 tablet Oral BID  . torsemide  80 mg Oral q morning - 10a   And  . torsemide  60 mg Oral QHS     Infusions: . sodium chloride 75 mL/hr at 08/29/20 0800  . clevidipine Stopped (08/25/20 1312)       Assessment/Plan   1. 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  -CT head x4- Small hemorrhage.  -MRI  - Stable ICH -PT/OT/Speech following. CIR recommended.  -Per Neurology   2. A fib  -Rate controlled. Continue Toprol XL 50mg  daily.  -Restarting per Heparin today per Neurology   3. Chronic Diastolic Heart Failure.  -ECHO preserved EF 55-60%. No thrombus -Volume status stable. Continue torsemide 80 mg qam/60 mg qpm which is home regimen.  - Taking po. Stop IV fluids.  - Renal function stable.   4. Mechanical Mitral Valve/ H/O Rheumatic Heart Disease Restarting Heparin drip as above.  Coumadin on hold with ICH . Will need to low INR goal 2.5-.3.    5. TVR Stable on ECHO.   6. CAD  - Coronary CTA  in 8/20 showed nonobstructive CAD.  - On statin. Aspirin on hold.   Length of Stay: [place], NP  08/29/2020, 9:44 AM  Advanced Heart Failure Team Pager [phone] (M-F; 7a - 4p)  Please contact [facility] for night-coverage after hours (4p -7a ) and weekends on [email or web address]  Patient see with NP, agree with the above note.   Still with headache and dizziness post-ICH, slowly improving.  Has not been out of bed.  Has a diet now and eating.   No dyspnea at rest.   Echo reviewed, EF 55-60%, mildly  decreased RV systolic function, severe biatrial enlargement, mechanical MV with mean gradient 9 but PHT low at 76, mild-moderate TR, TV s/p repair, IVC dilated.   General: NAD Neck: No JVD, no thyromegaly or thyroid nodule.  Lungs: Clear to auscultation bilaterally with normal respiratory effort. CV: Nondisplaced PMI.  Heart regular S1/S2 with mechanical S1, no S3/S4, 2/6 SEM RUSB.  No peripheral edema.  Abdomen: Soft, nontender, no hepatosplenomegaly, no distention.  Skin: Intact without lesions or rashes.  Neurologic: Alert and oriented x 3.  Psych: Normal affect. Extremities: No clubbing or cyanosis.   1. 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  - Starting heparin gtt today per neurology.  - Long term, will have her stop ASA.  When warfarin restarted, will aim for INR 2.5-3 for the time being.  2. Atrial fibrillation: Chronic.  - Continue Toprol XL 50 mg daily.     - Starting heparin gtt.  3. Mechanical mitral valve: Had been on ASA 81 daily and coumadin with INR goal 2.5-3.5.  Needs endocarditis prophylaxis with any dental work. Mechanical MV looked ok on echo this admission (elevated mean gradient but PHT not prolonged).  - Starting heparin gtt today per neurology.  - Long term, will have her stop ASA.  When warfarin restarted, will aim for INR 2.5-3 for the time being.  4. Tricuspid valve repair: Stable by echo in 12/21, TR appears mild-moderate with no  significant stenosis.  5. Chronic diastolic CHF:  Echo this admission with normal EF, mildly decreased RV systolic function.  Volume status looks ok. - Restart home torsemide 80 qam/60 qpm and KCl.  - Stop IV fluid.   6. CAD: coronary CTA in 8/20 showed nonobstructive CAD, suspect chest pain was noncardiac.  She still has episodes of atypical chest pain.    - Continue Crestor 10 mg daily.  Marca AnconaDalton Leilyn Frayre 08/29/2020 11:34 AM

## 2020-08-29 NOTE — Progress Notes (Signed)
ANTICOAGULATION CONSULT NOTE - Initial Consult  Pharmacy Consult for heparin Indication: Mechanical mitral valve  Allergies  Allergen Reactions  . Promethazine Hcl Other (See Comments)    REACTION: made her real shaky    Patient Measurements: Height: 4\' 11"  (149.9 cm) Weight: 69.2 kg (152 lb 8.9 oz) IBW/kg (Calculated) : 43.2 Heparin Dosing Weight: 69.2 kg   Vital Signs: Temp: 98.1 F (36.7 C) (01/03 0800) Temp Source: Oral (01/03 0800) BP: 130/86 (01/03 0800) Pulse Rate: 52 (01/03 0800)  Labs: Recent Labs    08/27/20 0204 08/28/20 0059 08/29/20 0202  HGB 10.0* 10.7* 11.7*  HCT 32.2* 34.5* 36.4  PLT 153 158 170  LABPROT 13.4 13.1 12.8  INR 1.1 1.0 1.0  CREATININE 1.15* 1.15* 1.17*    Estimated Creatinine Clearance: 39.5 mL/min (A) (by C-G formula based on SCr of 1.17 mg/dL (H)).   Medical History: Past Medical History:  Diagnosis Date  . Allergic rhinitis   . Anemia   . Borderline diabetes   . Carpal tunnel syndrome   . Chronic atrial fibrillation (HCC)    coumadin managed by [facility]. CVRR;  Event montor (7/12-8/12) showed no high rate episodes (patient continuously in atrial fibrillation).   . Chronic diastolic heart failure (HCC)   . Eosinophilia 01/27/2013  . External hemorrhoid   . External hemorrhoids   . Fatigue   . Gout   . 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 07/01/2012  . Hx of cardiovascular stress test    ETT-myoview (7/12): No evidence for ischemia or infarction  . Hyperlipidemia   . Hypertension   . Obesity   . 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 Rotator cuff syndrome      Medications:  Medications Prior to Admission  Medication Sig Dispense Refill Last Dose  . acetaminophen (TYLENOL) 500 MG tablet Take 500-1,000 mg by mouth every 6 (six) hours as needed for fever (pain).   08/24/2020 at Unknown time  . allopurinol (ZYLOPRIM) 300 MG tablet TAKE 1 TABLET BY MOUTH EVERY DAY (Patient taking differently: Take 300 mg by mouth daily.) 90 tablet 3 08/24/2020 at Unknown time  . aspirin 81 MG chewable tablet Chew 81 mg by mouth daily.   08/24/2020 at Unknown time  . cetirizine (ZYRTEC) 10 MG tablet Take 10 mg by mouth daily as needed for allergies.   Past Week  . cyanocobalamin 500 MCG tablet Take 1 tablet (500 mcg total) by mouth daily. 30 tablet 5 08/24/2020 at Unknown time  . cycloSPORINE (RESTASIS) 0.05 % ophthalmic emulsion Place 1 drop into both eyes daily as needed (dry eyes).   Past Month at Unknown time  . diazepam (VALIUM) 5 MG tablet Take 5 mg by mouth every 8 (eight) hours as needed for anxiety.    Past Week at Unknown time  . diclofenac Sodium (VOLTAREN) 1 % GEL APPLY 2 GRAMS TO AFFECTED AREA 4 TIMES A DAY (Patient taking differently: Apply 2 g topically 4 (four) times daily.) 100 g 3 Past Month  . famotidine (PEPCID) 20 MG tablet Take 20 mg by mouth daily.   08/24/2020 at  Unknown time  . folic acid (FOLVITE) 1 MG tablet TAKE 1 TABLET BY MOUTH EVERY DAY (Patient taking differently: Take 1 mg by mouth daily.) 90 tablet 3 08/24/2020 at Unknown time  . isosorbide mononitrate (IMDUR) 30 MG 24 hr tablet Take 30 mg by mouth daily.   08/24/2020 at Unknown time  . meclizine (ANTIVERT) 25 MG tablet TAKE 1 TABLET (25 MG TOTAL) BY MOUTH 3 (THREE) TIMES DAILY AS NEEDED FOR DIZZINESS. 30 tablet 0 Past Month at Unknown time  . metFORMIN (GLUCOPHAGE) 500 MG tablet TAKE 1 TABLET (500 MG TOTAL) BY MOUTH 2 (TWO) TIMES DAILY WITH A MEAL. 180 tablet 1 08/24/2020 at Unknown time  . metoprolol succinate (TOPROL-XL) 50 MG 24 hr tablet TAKE 1 TABLET BY MOUTH EVERY DAY (Patient  taking differently: Take 50 mg by mouth daily.) 90 tablet 3 08/24/2020 at 0800  . nitroGLYCERIN (NITROSTAT) 0.4 MG SL tablet Place 0.4 mg under the tongue every 5 (five) minutes as needed for chest pain.   one year ago  . Olopatadine HCl 0.7 % SOLN Place 1 drop into both eyes daily as needed (itching/ irritation).   Past Week  . omeprazole (PRILOSEC) 20 MG capsule TAKE 1 CAPSULE 2 (TWO) TIMES DAILY BEFORE A MEAL. TAKE WITH OMEPRAZOLE 40 MG TO EQUAL 60 MG (Patient taking differently: Take 20 mg by mouth 2 (two) times daily before a meal.) 180 capsule 3 08/24/2020 at Unknown time  . potassium chloride SA (KLOR-CON M20) 20 MEQ tablet Take 3 tablets (60 mEq total) by mouth 2 (two) times daily. 540 tablet 2 08/24/2020 at Unknown time  . rosuvastatin (CRESTOR) 10 MG tablet TAKE 1 TABLET BY MOUTH EVERY DAY (Patient taking differently: Take 10 mg by mouth daily.) 90 tablet 3 08/24/2020 at Unknown time  . torsemide (DEMADEX) 20 MG tablet TAKE 4 TABLETS (80 MG TOTAL) EVERY MORNING AND 3 TABLETS (60 MG TOTAL) EVERY EVENING. (Patient taking differently: Take 60-80 mg by mouth See admin instructions. TAKE 4 TABLETS (80 MG TOTAL) EVERY MORNING AND 3 TABLETS (60 MG TOTAL) EVERY EVENING.) 630 tablet 1 08/24/2020 at Unknown time  . warfarin (COUMADIN) 2 MG tablet TAKE 1/2-1 TABLET BY MOUTH EVERY DAY OR AS DIRECTED BY COUMADIN CLINIC (Patient taking differently: Take 1-2 mg by mouth See admin instructions. Take 2 mg every Tue, Thu, Sat; 1 mg all other days.) 75 tablet 1 08/22/2020 at 6pm  . zinc sulfate 220 (50 Zn) MG capsule Take 220 mg by mouth daily.   08/24/2020 at Unknown time  . ACCU-CHEK SOFTCLIX LANCETS lancets USE AS DIRECTED TO test blood sugar 100 each 6   . glucose blood (ACCU-CHEK AVIVA PLUS) test strip 1 each by Other route as needed for other. Use as needed for symptoms of low or high blood sugars 100 strip 12   . Menthol-Methyl Salicylate (TIGER BALM LINIMENT EX) Apply 1 application topically daily.        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 Warfarin was reversed with 65F-PCC and vitamin K and anticoagulation has been on hold since. Pharmacy consulted to resuming IV heparin today per stroke protocol. Of note, patient is currently on SQ heparin for VTE prophylaxis.   INR today remains stable and subtherapeutic at 1. H/H improved. Plt improved. CT head stable today.   Goal of Therapy:  Heparin level 0.3-0.5 units/ml Monitor platelets by anticoagulation protocol: Yes   Plan:  -D/c subQ  heparin -Start IV heparin at 1100 units/hr. No bolus  -F/u 6 hr HL -Monitor daily HL, CBC and s/s of bleeding   Albertina Parr, PharmD., BCPS, BCCCP Clinical Pharmacist Please refer to [facility] for unit-specific pharmacist

## 2020-08-30 DIAGNOSIS — I5033 Acute on chronic diastolic (congestive) heart failure: Secondary | ICD-10-CM

## 2020-08-30 LAB — BASIC METABOLIC PANEL
Anion gap: 12 (ref 5–15)
BUN: 29 mg/dL — ABNORMAL HIGH (ref 8–23)
CO2: 26 mmol/L (ref 22–32)
Calcium: 10.2 mg/dL (ref 8.9–10.3)
Chloride: 101 mmol/L (ref 98–111)
Creatinine, Ser: 1.29 mg/dL — ABNORMAL HIGH (ref 0.44–1.00)
GFR, Estimated: 45 mL/min — ABNORMAL LOW (ref >60)
Glucose, Bld: 159 mg/dL — ABNORMAL HIGH (ref 70–99)
Potassium: 4.3 mmol/L (ref 3.5–5.1)
Sodium: 139 mmol/L (ref 135–145)

## 2020-08-30 LAB — GLUCOSE, CAPILLARY
Glucose-Capillary: 271 mg/dL — ABNORMAL HIGH (ref 70–99)
Glucose-Capillary: 159 mg/dL — ABNORMAL HIGH (ref 70–99)
Glucose-Capillary: 179 mg/dL — ABNORMAL HIGH (ref 70–99)
Glucose-Capillary: 180 mg/dL — ABNORMAL HIGH (ref 70–99)

## 2020-08-30 LAB — PROTIME-INR
INR: 1.1 (ref 0.8–1.2)
Prothrombin Time: 13.3 seconds (ref 11.4–15.2)

## 2020-08-30 LAB — HEPARIN LEVEL (UNFRACTIONATED)
Heparin Unfractionated: 1.07 IU/mL — ABNORMAL HIGH (ref 0.30–0.70)
Heparin Unfractionated: 0.24 IU/mL — ABNORMAL LOW (ref 0.30–0.70)

## 2020-08-30 LAB — CBC
HCT: 40.2 % (ref 36.0–46.0)
Hemoglobin: 13.3 g/dL (ref 12.0–15.0)
MCH: 24.5 pg — ABNORMAL LOW (ref 26.0–34.0)
MCHC: 33.1 g/dL (ref 30.0–36.0)
MCV: 74 fL — ABNORMAL LOW (ref 80.0–100.0)
Platelets: 203 10*3/uL (ref 150–400)
RBC: 5.43 MIL/uL — ABNORMAL HIGH (ref 3.87–5.11)
RDW: 17 % — ABNORMAL HIGH (ref 11.5–15.5)
WBC: 8.5 10*3/uL (ref 4.0–10.5)
nRBC: 0 % (ref 0.0–0.2)

## 2020-08-30 MED ORDER — INSULIN ASPART 100 UNIT/ML ~~LOC~~ SOLN: 0.0000 [IU] | Freq: Three times a day (TID) | SUBCUTANEOUS | Status: DC

## 2020-08-30 MED ORDER — HEPARIN (PORCINE) 25000 UT/250ML-% IV SOLN
750.0000 [IU]/h | INTRAVENOUS | Status: DC
  Administered 2020-09-01: 08:00:00 700 [IU]/h via INTRAVENOUS
  Filled 2020-08-30 (×4): qty 250

## 2020-08-30 MED ORDER — INSULIN ASPART 100 UNIT/ML ~~LOC~~ SOLN
0.0000 [IU] | Freq: Three times a day (TID) | SUBCUTANEOUS | Status: DC
  Administered 2020-08-30 (×2): 3 [IU] via SUBCUTANEOUS
  Administered 2020-08-31 (×2): 5 [IU] via SUBCUTANEOUS
  Administered 2020-08-31: 3 [IU] via SUBCUTANEOUS
  Administered 2020-09-01 (×2): 8 [IU] via SUBCUTANEOUS
  Administered 2020-09-01 – 2020-09-02 (×2): 3 [IU] via SUBCUTANEOUS
  Administered 2020-09-02: 5 [IU] via SUBCUTANEOUS

## 2020-08-30 MED ORDER — PANTOPRAZOLE SODIUM 40 MG PO TBEC
40.0000 mg | DELAYED_RELEASE_TABLET | Freq: Every day | ORAL | Status: DC
  Administered 2020-08-30 – 2020-09-01 (×3): 40 mg via ORAL
  Filled 2020-08-30 (×3): qty 1

## 2020-08-30 NOTE — PMR Pre-admission (Addendum)
PMR Admission Coordinator Pre-Admission Assessment  Patient: Deborah [name] is an 70 y.o., female MRN: [id number] DOB: 28-Feb-1951 Height: 4\' 11"  (149.9 cm) Weight: 69.2 kg              Insurance Information HMO:     PPO:      PCP:      IPA:      80/20: yes      OTHER:  PRIMARY: Medicare Part A and B       Policy#: [id number]      Subscriber: Pt.  CM Name:       Phone#:      Fax#:  Pre-Cert#:  verified 1f:  Benefits:  Phone #:      Name:  Eff. Date: 09/27/2017  A and B     Deduct: $1566 Out of Pocket Max: n/a      Life Max: n/a CIR: 100%      SNF: 20 full days Outpatient: 80%     Co-Pay: 20% Home Health: 100%      Co-Pay:  DME: 80%     Co-Pay: 20% Providers: pt choice  SECONDARY: Medicaid [place] Access       Policy#: 11/25/2017 m     Financial Counselor: n/a  The "Data Collection Information Summary" for patients in Inpatient Rehabilitation Facilities with attached "Privacy Act Statement-Health Care Records" was provided and verbally reviewed with: Patient and Family  Emergency Contact Information Contact Information     Name Relation Home Work Mobile   [place] Daughter [phone]  [phone]   [place],Sal Spouse [phone]     [phone]  [phone]      Current Medical History   Patient Admitting Diagnosis: IPH History of Present Illness: With history of chronic atrial fibrillation rheumatic heart disease status post mitral valve replacement and tricuspid replacement maintained on chronic Coumadin, hypertension, hyperlipidemia, obesity with BMI 30.81.  Per chart review patient lives with her family.  Independent prior to admission.  1 level home 3 steps to entry.  Independent and active.  She does not drive.  Presented 08/24/2020 with headache nausea vomiting and decreased mobility x2 days.  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   No downward herniation.  CT abdomen pelvis showed large amount of stool no other abnormality identified.  Chest x-ray showed marked cardiac enlargement with pulmonary vascular congestion awaiting plan by follow-up cardiology services.  Admission chemistries hemoglobin 11, blood cultures no growth to date, lactic acid 2.9, INR 2.1, troponin negative.  Chronic Coumadin currently discontinued due to IPH however she was cleared to begin subcutaneous heparin for DVT prophylaxis 08/25/2020, on IV heparin started on 08/29/2020.  Follow-up cranial CT scan 08/25/2020 showed no significant change.  Echocardiogram ejection fraction of 55 to 60% no wall motion abnormalities.  Currently on a regular diet.  Due to patient decrease in functional mobility therapy evaluations completed with recommendations of physical medicine rehab consult.  Complete NIHSS TOTAL: 0 Glasgow Coma Scale Score: 15  Past Medical History  Past Medical History:  Diagnosis Date   Allergic rhinitis    Anemia    Borderline diabetes    Carpal tunnel syndrome    Chronic atrial fibrillation (HCC)    coumadin managed by [facility]. CVRR;  Event montor (7/12-8/12) showed no high rate episodes (patient continuously in atrial fibrillation).    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  Prior Rehab/Hospitalizations:  Has the patient had prior rehab or hospitalizations prior to admission? No  Has the patient had major surgery during 100 days prior to admission? No  Current Medications   Current Facility-Administered Medications:    acetaminophen (TYLENOL) tablet 650 mg, 650 mg, Oral, Q4H PRN **OR** acetaminophen (TYLENOL) 160 MG/5ML solution 650 mg, 650 mg, Per Tube, Q4H PRN **OR** acetaminophen (TYLENOL) suppository 650 mg, 650 mg, Rectal, Q4H PRN, Amie Portland, MD   benzonatate (TESSALON) capsule 100 mg, 100 mg, Oral, TID PRN, Gwinda Maine, MD   Chlorhexidine Gluconate Cloth 2 % PADS 6 each, 6 each, Topical, Daily, Amie Portland, MD, 6 each at 08/30/20 [phone]   [DISCONTINUED] labetalol (NORMODYNE) injection 20 mg, 20 mg, Intravenous, Once **AND** clevidipine (CLEVIPREX) infusion 0.5 mg/mL, 0-21 mg/hr, Intravenous, Continuous, Amie Portland, MD, Stopped at 08/25/20 1312   divalproex (DEPAKOTE ER) 24 hr tablet 500 mg, 500 mg, Oral, Daily, Leonie Man, Pramod S, MD, 500 mg at 08/31/20 0951   heparin ADULT infusion 100 units/mL (25000 units/273mL), 700 Units/hr, Intravenous, Continuous, Einar Grad, RPH, Last Rate: 7 mL/hr at 08/31/20 0900, 700 Units/hr at 08/31/20 0900   insulin aspart (novoLOG) injection 0-15 Units, 0-15 Units, Subcutaneous, TID WC, Garvin Fila, MD, 5 Units at 08/31/20 1220   isosorbide mononitrate (IMDUR) 24 hr tablet 30 mg, 30 mg, Oral, Daily, Rosalin Hawking, MD, 30 mg at 08/31/20 6962   labetalol (NORMODYNE) injection 5-20 mg, 5-20 mg, Intravenous, Q10 min PRN, Rosalin Hawking, MD, 20 mg at 08/27/20 2013   meclizine (ANTIVERT) tablet 25 mg, 25 mg, Oral, TID PRN,  Rosalin Hawking, MD, 25 mg at 08/30/20 2022   metoprolol succinate (TOPROL-XL) 24 hr tablet 50 mg, 50 mg, Oral, Daily, Rosalin Hawking, MD, 50 mg at 08/31/20 0951   ondansetron (ZOFRAN) injection 4 mg, 4 mg, Intravenous, Q6H PRN, Rosalin Hawking, MD, 4 mg at 08/28/20 1609   pantoprazole (PROTONIX) EC tablet 40 mg, 40 mg, Oral, QHS, Sethi, Pramod S, MD, 40 mg at 08/30/20 2130   potassium chloride SA (KLOR-CON) CR tablet 40 mEq, 40 mEq, Oral, BID, Gwinda Maine, MD, 40 mEq at 08/31/20 0951   rosuvastatin (CRESTOR) tablet 10 mg, 10 mg, Oral, Daily, Larey Dresser, MD, 10 mg at 08/31/20 9528   senna-docusate (Senokot-S) tablet 1 tablet, 1 tablet, Oral, BID, Amie Portland, MD, 1 tablet at 08/31/20 0951   torsemide (DEMADEX) tablet 80 mg, 80 mg,  Oral, q morning - 10a, 80 mg at 08/31/20 0952 **AND** torsemide (DEMADEX) tablet 60 mg, 60 mg, Oral, QHS, Marvel Plan, MD, 60 mg at 08/30/20 2130  Patients Current Diet:  Diet Order             Diet Carb Modified Fluid consistency: Thin; Room service appropriate? Yes  Diet effective now                   Precautions / Restrictions Precautions Precautions: Fall Precaution Comments: dizziness with mobility, used gaze stabilization Restrictions Weight Bearing Restrictions: No   Has the patient had 2 or more falls or a fall with injury in the past year?No  Prior Activity Level Limited Community (1-2x/wk): Went out for appointments  Prior Functional Level Prior Function Level of Independence: Independent Comments: ADLs and IADLs. Enjoys walking for exercises. Does not drive  Self Care: Did the patient need help bathing, dressing, using the toilet or eating?  Independent  Indoor Mobility: Did the patient need assistance with walking from room to room (with or without device)? Independent  Stairs: Did the patient need assistance with internal or external stairs (with or without device)? Independent  Functional Cognition: Did the patient need help  planning regular tasks such as shopping or remembering to take medications? Independent  Home Assistive Devices / Equipment Home Assistive Devices/Equipment: None Home Equipment: None  Prior Device Use: Indicate devices/aids used by the patient prior to current illness, exacerbation or injury? None of the above  Current Functional Level Cognition  Arousal/Alertness:  (awake) Overall Cognitive Status: Within Functional Limits for tasks assessed Orientation Level: Oriented X4 General Comments: pt able to follow commands consistently Attention: Sustained Sustained Attention: Appears intact Memory: Appears intact Awareness: Appears intact Problem Solving: Appears intact Safety/Judgment: Appears intact    Extremity Assessment (includes Sensation/Coordination)  Upper Extremity Assessment: Generalized weakness LUE Deficits / Details: decr grasp 3 out 5/ increased time to release on command  Lower Extremity Assessment: Defer to PT evaluation    ADLs  Overall ADL's : Needs assistance/impaired Eating/Feeding: Moderate assistance,Bed level Eating/Feeding Details (indicate cue type and reason): daughter feeding patient. pt states "ate alot" Grooming: Wash/dry face,Modified independent,Bed level Grooming Details (indicate cue type and reason): blowing nose in bed on arrival . pt noted to remove large clot from nasal passage Upper Body Bathing: Maximal assistance Lower Body Bathing: Total assistance Toilet Transfer: +2 for physical assistance,Moderate assistance Functional mobility during ADLs: +2 for physical assistance,Moderate assistance General ADL Comments: session focused on basic transfer to help with 3n1 transfers and OOB to chair for meals. pt has prolonged bed level care for this stay and needs to progress oob    Mobility  Overal bed mobility: Needs Assistance Bed Mobility: Supine to Sit Rolling: Mod assist Supine to sit: Mod assist,+2 for physical assistance Sit to supine:  Mod assist,+2 for physical assistance General bed mobility comments: had pt focus on non-moving object, brough legs to EOB, HOB very elevated to do helicopter transfer to minimize dizziness    Transfers  Overall transfer level: Needs assistance Equipment used: 2 person hand held assist Transfers: Sit to/from Stand Sit to Stand: Mod assist,+2 physical assistance General transfer comment: modA to power up due to pt fear of dizziness and nausa, used gaze stabilization    Ambulation / Gait / Stairs / Wheelchair Mobility  Ambulation/Gait Ambulation/Gait assistance: Mod assist,+2 physical assistance Gait Distance (Feet): 15 Feet (x2) Assistive device: 2 person hand held assist Gait Pattern/deviations: Step-through pattern,Decreased stride length,Narrow  base of support General Gait Details: pt very cautious and guarded with lateral sway due to dizziness. seated rest break required, pt dry heaving at end of 2nd bout Gait velocity: slow    Posture / Balance Dynamic Sitting Balance Sitting balance - Comments: posterior lean Balance Overall balance assessment: Needs assistance Sitting-balance support: Feet unsupported,Single extremity supported Sitting balance-Leahy Scale: Poor Sitting balance - Comments: posterior lean Standing balance support: Bilateral upper extremity supported Standing balance-Leahy Scale: Poor Standing balance comment: reliant on bilat HHA    Special needs/care consideration Pt. On Heparin drip     Previous Home Environment (from acute therapy documentation) Living Arrangements: Children,Spouse/significant other Available Help at Discharge: Family,Available 24 hours/day Type of Home: House Home Layout: One level Home Access: Stairs to enter Entrance Stairs-Rails: None (front steps) Entrance Stairs-Number of Steps: front 3; back 9 Bathroom Shower/Tub: Engineer, manufacturing systems: Standard Home Care Services: No  Discharge Living Setting Plans for Discharge  Living Setting: Patient's home Type of Home at Discharge: House Discharge Home Layout: One level Discharge Home Access: Stairs to enter Entrance Stairs-Rails: None Entrance Stairs-Number of Steps: front 3, back 9 Discharge Bathroom Shower/Tub: Tub/shower unit Discharge Bathroom Toilet: Standard Discharge Bathroom Accessibility: Yes How Accessible: Accessible via walker Does the patient have any problems obtaining your medications?: No  Social/Family/Support Systems Patient Roles: Other (Comment) Contact Information: [phone] Anticipated Caregiver: Rulon Sera (daughter) Anticipated Caregiver's Contact Information: [phone] Ability/Limitations of Caregiver: Can provide Mod A Caregiver Availability: 24/7 Discharge Plan Discussed with Primary Caregiver: Yes Is Caregiver In Agreement with Plan?: Yes Does Caregiver/Family have Issues with Lodging/Transportation while Pt is in Rehab?: No   Goals Patient/Family Goal for Rehab: PT/SLP Supervision, OT min A Expected length of stay: 14-18 days Pt/Family Agrees to Admission and willing to participate: Yes Program Orientation Provided & Reviewed with Pt/Caregiver Including Roles  & Responsibilities: Yes   Decrease burden of Care through IP rehab admission: Specialzed equipment needs, Decrease number of caregivers, and Patient/family education   Possible need for SNF placement upon discharge:not anticipated   Patient Condition: This patient's medical and functional status has changed since the consult dated: 08/30/2019 in which the Rehabilitation Physician determined and documented that the patient's condition is appropriate for intensive rehabilitative care in an inpatient rehabilitation facility. See "History of Present Illness" (above) for medical update. Functional changes are: Pt. Ambulating Mod A+ 2 84ft x2. Patient's medical and functional status update has been discussed with the Rehabilitation physician and patient remains  appropriate for inpatient rehabilitation. Will admit to inpatient rehab today.  Preadmission Screen Completed By:  Jeronimo Greaves, CCC-SLP, 08/31/2020 1:49 PM ______________________________________________________________________   Discussed status with Dr. Allena Katz on1/5/22 at 9:30 am and received approval for admission today.  Admission Coordinator:  Jeronimo Greaves, [id number] /Date01/12/2020

## 2020-08-30 NOTE — Progress Notes (Signed)
Inpatient Rehab Admissions Coordinator:   Met with patient at bedside to discuss potential CIR admission. Pt. Stated interest. Will pursue for potential admit next week, pending bed availability and medical readiness.  Clemens Catholic, [place], [place] Admissions Coordinator  [phone] ([place]) [phone] (office)

## 2020-08-30 NOTE — Progress Notes (Signed)
ANTICOAGULATION CONSULT NOTE  Pharmacy Consult for heparin Indication: Mechanical mitral valve  Allergies  Allergen Reactions  . Promethazine Hcl Other (See Comments)    REACTION: made her real shaky    Patient Measurements: Height: 4\' 11"  (149.9 cm) Weight: 69.2 kg (152 lb 8.9 oz) IBW/kg (Calculated) : 43.2 Heparin Dosing Weight: 69.2 kg   Vital Signs: Temp: 98.7 F (37.1 C) (01/04 1600) Temp Source: Oral (01/04 1600) BP: 127/91 (01/04 1700) Pulse Rate: 81 (01/04 1700)  Labs: Recent Labs    08/28/20 0059 08/29/20 0202 08/29/20 1745 08/30/20 0405 08/30/20 1807  HGB 10.7* 11.7*  --  13.3  --   HCT 34.5* 36.4  --  40.2  --   PLT 158 170  --  203  --   LABPROT 13.1 12.8  --  13.3  --   INR 1.0 1.0  --  1.1  --   HEPARINUNFRC  --   --  0.79* 1.07* 0.24*  CREATININE 1.15* 1.17*  --  1.29*  --     Estimated Creatinine Clearance: 35.8 mL/min (A) (by C-G formula based on SCr of 1.29 mg/dL (H)).   Medical History: Past Medical History:  Diagnosis Date  . Allergic rhinitis   . Anemia   . Borderline diabetes   . Carpal tunnel syndrome   . Chronic atrial fibrillation (HCC)    coumadin managed by [facility]. CVRR;  Event montor (7/12-8/12) showed no high rate episodes (patient continuously in atrial fibrillation).   . Chronic diastolic heart failure (HCC)   . Eosinophilia 01/27/2013  . External hemorrhoid   . External hemorrhoids   . Fatigue   . Gout   . 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 07/01/2012  . Hx of cardiovascular stress test    ETT-myoview (7/12): No evidence for ischemia or infarction  . Hyperlipidemia   . Hypertension   . Obesity   . Rheumatic heart disease    Status post mechanical (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 Rotator cuff syndrome     Medications:  Medications Prior to Admission  Medication Sig Dispense Refill Last Dose  . acetaminophen (TYLENOL) 500 MG tablet Take 500-1,000 mg by mouth every 6 (six) hours as needed for fever (pain).   08/24/2020 at Unknown time  . allopurinol (ZYLOPRIM) 300 MG tablet TAKE 1 TABLET BY MOUTH EVERY DAY (Patient taking differently: Take 300 mg by mouth daily.) 90 tablet 3 08/24/2020 at Unknown time  . aspirin 81 MG chewable tablet Chew 81 mg by mouth daily.   08/24/2020 at Unknown time  . cetirizine (ZYRTEC) 10 MG tablet Take 10 mg by mouth daily as needed for allergies.   Past Week  . cyanocobalamin 500 MCG tablet Take 1 tablet (500 mcg total) by mouth daily. 30 tablet 5 08/24/2020 at Unknown time  . cycloSPORINE (RESTASIS) 0.05 % ophthalmic emulsion Place 1 drop into both eyes daily as needed (dry eyes).   Past Month at Unknown time  . diazepam (VALIUM) 5 MG tablet Take 5 mg by mouth every 8 (eight) hours as needed for anxiety.    Past Week at Unknown time  . diclofenac  Sodium (VOLTAREN) 1 % GEL APPLY 2 GRAMS TO AFFECTED AREA 4 TIMES A DAY (Patient taking differently: Apply 2 g topically 4 (four) times daily.) 100 g 3 Past Month  . famotidine (PEPCID) 20 MG tablet Take 20 mg by mouth daily.   08/24/2020 at Unknown time  . folic acid (FOLVITE) 1 MG tablet TAKE 1 TABLET BY MOUTH EVERY DAY (Patient taking differently: Take 1 mg by mouth daily.) 90 tablet 3 08/24/2020 at Unknown time  . isosorbide mononitrate (IMDUR) 30 MG 24 hr tablet Take 30 mg by mouth daily.   08/24/2020 at Unknown time  . meclizine (ANTIVERT) 25 MG tablet TAKE 1 TABLET (25 MG TOTAL) BY MOUTH 3 (THREE) TIMES DAILY AS NEEDED FOR DIZZINESS. 30 tablet 0 Past Month at Unknown time  . metFORMIN (GLUCOPHAGE) 500 MG tablet TAKE 1 TABLET (500 MG TOTAL) BY MOUTH 2 (TWO) TIMES DAILY WITH A MEAL. 180 tablet 1 08/24/2020  at Unknown time  . metoprolol succinate (TOPROL-XL) 50 MG 24 hr tablet TAKE 1 TABLET BY MOUTH EVERY DAY (Patient taking differently: Take 50 mg by mouth daily.) 90 tablet 3 08/24/2020 at 0800  . nitroGLYCERIN (NITROSTAT) 0.4 MG SL tablet Place 0.4 mg under the tongue every 5 (five) minutes as needed for chest pain.   one year ago  . Olopatadine HCl 0.7 % SOLN Place 1 drop into both eyes daily as needed (itching/ irritation).   Past Week  . omeprazole (PRILOSEC) 20 MG capsule TAKE 1 CAPSULE 2 (TWO) TIMES DAILY BEFORE A MEAL. TAKE WITH OMEPRAZOLE 40 MG TO EQUAL 60 MG (Patient taking differently: Take 20 mg by mouth 2 (two) times daily before a meal.) 180 capsule 3 08/24/2020 at Unknown time  . potassium chloride SA (KLOR-CON M20) 20 MEQ tablet Take 3 tablets (60 mEq total) by mouth 2 (two) times daily. 540 tablet 2 08/24/2020 at Unknown time  . rosuvastatin (CRESTOR) 10 MG tablet TAKE 1 TABLET BY MOUTH EVERY DAY (Patient taking differently: Take 10 mg by mouth daily.) 90 tablet 3 08/24/2020 at Unknown time  . torsemide (DEMADEX) 20 MG tablet TAKE 4 TABLETS (80 MG TOTAL) EVERY MORNING AND 3 TABLETS (60 MG TOTAL) EVERY EVENING. (Patient taking differently: Take 60-80 mg by mouth See admin instructions. TAKE 4 TABLETS (80 MG TOTAL) EVERY MORNING AND 3 TABLETS (60 MG TOTAL) EVERY EVENING.) 630 tablet 1 08/24/2020 at Unknown time  . warfarin (COUMADIN) 2 MG tablet TAKE 1/2-1 TABLET BY MOUTH EVERY DAY OR AS DIRECTED BY COUMADIN CLINIC (Patient taking differently: Take 1-2 mg by mouth See admin instructions. Take 2 mg every Tue, Thu, Sat; 1 mg all other days.) 75 tablet 1 08/22/2020 at 6pm  . zinc sulfate 220 (50 Zn) MG capsule Take 220 mg by mouth daily.   08/24/2020 at Unknown time  . ACCU-CHEK SOFTCLIX LANCETS lancets USE AS DIRECTED TO test blood sugar 100 each 6   . glucose blood (ACCU-CHEK AVIVA PLUS) test strip 1 each by Other route as needed for other. Use as needed for symptoms of low or high blood sugars  100 strip 12   . Menthol-Methyl Salicylate (TIGER BALM LINIMENT EX) Apply 1 application topically daily.       Assessment: 40 YOF on warfarin at home for h/o mechanical mitral valve presented to the ED on 12/29 with cerebellar vermis hemorrhage. Warfarin was reversed with 82F-PCC and vitamin K and anticoagulation has been on hold since. Pharmacy consulted to resuming IV heparin today per stroke protocol. Of note, patient  is currently on SQ heparin for VTE prophylaxis.   Heparin level tonight now subtherapeutic, confirmed to be drawn appropriately from phlebotomy.  Goal of Therapy:  Heparin level 0.3-0.5 units/ml Monitor platelets by anticoagulation protocol: Yes   Plan:  -Increase heparin to 700 units/h -Recheck heparin level in 8h   Arrie Senate, PharmD, [place], [facility] Clinical Pharmacist [phone] Please check AMION for all [facility] Pharmacy numbers 08/30/2020

## 2020-08-30 NOTE — Progress Notes (Signed)
ANTICOAGULATION CONSULT NOTE Pharmacy Consult for heparin Indication: Mechanical mitral valve  Allergies  Allergen Reactions  . Promethazine Hcl Other (See Comments)    REACTION: made her real shaky    Patient Measurements: Height: 4\' 11"  (149.9 cm) Weight: 69.2 kg (152 lb 8.9 oz) IBW/kg (Calculated) : 43.2 Heparin Dosing Weight: 69.2 kg   Vital Signs: Temp: 97.9 F (36.6 C) (01/04 0400) Temp Source: Oral (01/04 0400) BP: 146/109 (01/04 0410) Pulse Rate: 82 (01/04 0410)  Labs: Recent Labs    08/28/20 0059 08/29/20 0202 08/29/20 1745 08/30/20 0405  HGB 10.7* 11.7*  --  13.3  HCT 34.5* 36.4  --  40.2  PLT 158 170  --  203  LABPROT 13.1 12.8  --  13.3  INR 1.0 1.0  --  1.1  HEPARINUNFRC  --   --  0.79* 1.07*  CREATININE 1.15* 1.17*  --  1.29*    Estimated Creatinine Clearance: 35.8 mL/min (A) (by C-G formula based on SCr of 1.29 mg/dL (H)).  Assessment: 70 yo female with h/o mechanical mitral valve, Coumadin on hold s/p cerebellar vermis hemorrhage 12/29, for heparin.  Goal of Therapy:  Heparin level 0.3-0.5 units/ml Monitor platelets by anticoagulation protocol: Yes   Plan:  Hold heparin x 90 min, then decrease heparin 600 units/hr Check heparin level in 8 hours.  1/30, PharmD, BCPS

## 2020-08-30 NOTE — Progress Notes (Addendum)
STROKE TEAM PROGRESS NOTE   INTERVAL HISTORY  Patient started on IV Heparin drip yesterday. With goal of Heparin level b/w 0.3-0.7, it is decreased from 1100 units/hr to 850 to 600 units/hr. Last Heparin levels this morning were 1.07, heparin was hold for 90 mins and then re-started at 600 units/hr & will re-check Heparin levels in 8 hours.  Patient evaluated at bedside this AM. Sitting up and eating her breakfast. She states she had 2 episodes oh headaches lasting <15 seconds last night with no episodes this morning. States does not feel dizzy this morning with head movement but feels tired overall. Denies any nausea or vomiting. Had her last bowel movement 2 days ago but feels like will have one today. States was able to work with PT yesterday better than day before. Neurological exam stable. Afebrile and blood pressure controlled well. Ordered CT scan head wo contrast for tomorrow morning-if stable, will consider transfer from ICU. Neurological exam is unchanged today Vitals:   08/30/20 0800 08/30/20 0900 08/30/20 1000 08/30/20 1100  BP: (!) 131/97 (!) 154/108 (!) 145/97 (!) 128/99  Pulse: 81 (!) 102 80 80  Resp: 15 (!) 22 14 18   Temp: 98 F (36.7 C)     TempSrc: Oral     SpO2: 92% 91% 97% 93%  Weight:      Height:       CBC:  Recent Labs  Lab 08/24/20 2003 08/24/20 2158 08/29/20 0202 08/30/20 0405  WBC 10.1   < > 7.5 8.5  NEUTROABS 4.4  --   --   --   HGB 11.0*   < > 11.7* 13.3  HCT 36.3   < > 36.4 40.2  MCV 76.7*   < > 74.1* 74.0*  PLT 201   < > 170 203   < > = values in this interval not displayed.   Basic Metabolic Panel:  Recent Labs  Lab 08/29/20 0202 08/30/20 0405  NA 140 139  K 3.9 4.3  CL 101 101  CO2 26 26  GLUCOSE 147* 159*  BUN 18 29*  CREATININE 1.17* 1.29*  CALCIUM 9.6 10.2   Lipid Panel:  Recent Labs  Lab 08/24/20 2115  CHOL 150  TRIG 170*  HDL 45  CHOLHDL 3.3  VLDL 34  LDLCALC 71   HgbA1c:  Recent Labs  Lab 08/24/20 2154  HGBA1C 7.5*     IMAGING past 24 hours  DG CHEST PORT 1 VIEW 08/26/2020 IMPRESSION:   1. Marked cardiac enlargement with pulmonary vascular congestion.  2. Nodular opacity in the LEFT upper chest versus vessel on end. Follow-up PA and lateral chest is suggested to ensure resolution and or CT of the chest on follow-up after resolution of acute process.  3. No lobar consolidative changes. No overt pulmonary edema.  4. Question of LEFT lower lobe atelectasis, attention on follow-up.   ECHOCARDIOGRAM COMPLETE 08/26/2020  IMPRESSIONS   1. Left ventricular ejection fraction, by estimation, is 55 to 60%. The left ventricle has normal function. The left ventricle has no regional wall motion abnormalities. There is mild left ventricular hypertrophy. Left ventricular diastolic parameters are indeterminate.   2. Right ventricular systolic function is mildly reduced. The right ventricular size is normal. There is moderately elevated pulmonary artery systolic pressure. The estimated right ventricular systolic pressure is 45.0 mmHg.   3. Left atrial size was severely dilated.   4. Right atrial size was severely dilated.   5. 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 Mean gradient 9  mmHg but PHT 76 msec (short). Suspect that she does not have hemodynamically significant mitral mitral stenosis and there does not appear to be significant mitral regurgitation.   6. Status post tricuspid valve repair. Mean gradient 6 mmHg with mild-moderate tricuspid regurgitation.   7. The aortic valve is tricuspid. Aortic valve regurgitation is mild. Mild aortic valve sclerosis is present, with no evidence of aortic valve stenosis.   8. The inferior vena cava is dilated in size with <50% respiratory variability, suggesting right atrial pressure of 15 mmHg.   9. The patient is in atrial fibrillation.   PHYSICAL EXAM   Temp:  [97.9 F (36.6 C)-98 F (36.7 C)] 98 F (36.7 C) (01/04 0800) Pulse Rate:  [76-103] 80 (01/04 1100) Resp:   [13-31] 18 (01/04 1100) BP: (98-154)/(68-109) 128/99 (01/04 1100) SpO2:  [91 %-97 %] 93 % (01/04 1100)  General -pleasant middle-age Asian [place] lady sitting comfortably in bed, NAD  Cardiovascular - irregularly irregular heart rate and rhythm.  Psych: Normal mood and affect  Neurological Exam:  Awake, alert and interactive. Oriented to time place and person.   No dysarthria and no aphasia, able to name and repeat, follows all simple commands.    Visual field full, no gaze palsy, no significant nystagmus seen on my exam. Facial symmetrical, tongue midline. PERRL.  Moving b/l against gravity symmetrical, moving b/l LEs 3/5 proximal and 4/5 distally. Sensation symmetrical. B/l FTN grossly intact without ataxia. Gait not tested.   ASSESSMENT/PLAN [name] is a 70 y.o. 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 CT scan shows cerebellar vermis hemorrhage  likely due to warfarin coagulopathy with supratherapeutic INR   ICH:            MRI:  Stable cerebellar vermis ICH  CT head: cerebellar vermis small hemorrhage  CTA head & neck unremarkable  Repeat CT head x 2 stable hemorrhage and mass effect-  Ordered CT Head for tomorrow morning    2D Echo - EF 55-60%. No source of embolus. LA severely dilated ; mechanical mitral valve ; tricuspid valve repair ; patient was in atrial fibrillation.   LDL 71  HgbA1c 7.5  VTE prophylaxis - None  aspirin 81 mg daily and warfarin daily prior to admission, now on IV heparin drip  Therapy recommendations:  CIR recommended  Disposition:  pending  Chronic afib Mechanical MV TV repair   On warfarin at home  INR 4.3->2.1->kcentra and vitK->1.1  Decrease IV Heparin drip to 600 units/hr  and f/u of Heparin Level after 8 hrs and then daily along with CBC and any signs of bleeding.  Pt  does have high risk of embolic event with mechanical valve at this time - discussed with daughter and son (phone)  CXR - 08/26/20 - Marked cardiac enlargement with pulmonary vascular congestion.  Demadex PTA - 80 mg Q AM - 60 mg QPM -> resumed 08/26/20 (cardiologist - Larey Dresser, MD - [facility])  Potassium 3.5->3.8>4.3  Hypertension  Home meds:  Imdur, metoprolol, torsemide  Stable  Off cleviprex now . Continue imdur and metoprolol . Continue torsemide and potassium  . Long-term BP goal normotensive . Labetalol PRN  Hyperlipidemia  Home meds: Crestor 10  LDL 71, goal < 70  Resume Crestor 10 mg   Diabetes type II Uncontrolled  Home meds:  metformin  HgbA1c 7.5, goal <  7.0  CBGs   SSI changed from sensitive to moderate for better control.  PCP close follow up   Other Stroke Risk Factors  Advanced Age >/= 4   Obesity, Body mass index is 30.81 kg/m., BMI >/= 30 associated with increased stroke risk, recommend weight loss, diet and exercise as appropriate   Other Active Problems, Findings and Recommendations  HA, nausea and vomiting secondary to ICH. Sx management  CKD IIIa, Cre 1.45->1.30->1.29->1.15->1.15>1.17>1.29  Nodular opacity in the LEFT upper chest versus vessel on end. Follow-up PA and lateral chest is suggested to ensure resolution and or CT of the chest on follow-up after resolution of acute process. Repeat CXR  08/28/20 - Stable moderate cardiomegaly without pulmonary edema. No active  pulmonary disease.  Consider Chest CT as per radiology and note from Dr Thomasena Edis regarding conversation with pt's daughter.  Consider cardiology consult. Pt sees Dr [name] - [facility].  Arnoldo Lenis, MD PGY-1 Resident Attending Note :  Patient neurological exam remains stable.  IV heparin drip was supratherapeutic this morning hence it was held and has been restarted now at a lower rate.  Pharmacy to closely monitor follow heparin levels with him to  keep it in the low therapeutic range.  Plan repeat CT scan of the head tomorrow morning stable consider transfer out of ICU.  Continue ongoing therapies and hopefully transfer to inpatient rehab later this week.  Long discussion of the bedside with the patient and daughter and answered questions.  Discussed with pharmacist and care team.This patient is critically ill and at significant risk of neurological worsening, death and care requires constant monitoring of vital signs, hemodynamics,respiratory and cardiac monitoring, extensive review of multiple databases, frequent neurological assessment, discussion with family, other specialists and medical decision making of high complexity.I have made any additions or clarifications directly to the above note.This critical care time does not reflect procedure time, or teaching time or supervisory time of PA/NP/Med Resident etc but could involve care discussion time.  I spent 30 minutes of neurocritical care time  in the care of  this patient.   Delia Heady, MD Medical Director [facility] Pager: [phone] 08/30/2020 12:06 PM   To contact Stroke Continuity provider, please refer to [email or web address]. After hours, contact General Neurology

## 2020-08-30 NOTE — Progress Notes (Addendum)
Advanced Heart Failure Rounding Note  PCP-Cardiologist: No primary care provider on file.   Subjective:    Heparin gtt started yesterday. INR 1.0 today. Neuro exam ok.   No events overnight other than mild pleuritic CP.   Has mild AKI. SCr up from 1.17>>1.29. BUN 18>>29.   SBP 130s   Sitting up in bed eating breakfast. PO intake is improving. Daughter present at bedside.    Objective:   Weight Range: 69.2 kg Body mass index is 30.81 kg/m.   Vital Signs:   Temp:  [97.9 F (36.6 C)-98.5 F (36.9 C)] 97.9 F (36.6 C) (01/04 0400) Pulse Rate:  [76-103] 76 (01/04 0700) Resp:  [13-31] 15 (01/04 0700) BP: (98-149)/(68-109) 100/75 (01/04 0700) SpO2:  [92 %-97 %] 94 % (01/04 0700) Last BM Date: 08/26/21  Weight change: Filed Weights   08/24/20 1955 08/25/20 0305  Weight: 67.8 kg 69.2 kg    Intake/Output:   Intake/Output Summary (Last 24 hours) at 08/30/2020 0809 Last data filed at 08/30/2020 0800 Gross per 24 hour  Intake 479.5 ml  Output 3200 ml  Net -2720.5 ml      Physical Exam    General:  Well appearing. No resp difficulty HEENT: Normal Neck: Supple. JVP . Carotids 2+ bilat; no bruits. No lymphadenopathy or thyromegaly appreciated. Cor: PMI nondisplaced. Irregularly irregular rhythm and rate. + crisp mechanical valve sounds Lungs: Clear Abdomen: Soft, nontender, nondistended. No hepatosplenomegaly. No bruits or masses. Good bowel sounds. Extremities: No cyanosis, clubbing, rash, edema Neuro: Alert & orientedx3, cranial nerves grossly intact. moves all 4 extremities w/o difficulty. Affect pleasant   Telemetry   Atrial fibrillation 80s   EKG    No new EKG to review   Labs    CBC Recent Labs    08/29/20 0202 08/30/20 0405  WBC 7.5 8.5  HGB 11.7* 13.3  HCT 36.4 40.2  MCV 74.1* 74.0*  PLT 170 203   Basic Metabolic Panel Recent Labs    80/99/83 0202 08/30/20 0405  NA 140 139  K 3.9 4.3  CL 101 101  CO2 26 26  GLUCOSE 147* 159*  BUN  18 29*  CREATININE 1.17* 1.29*  CALCIUM 9.6 10.2   Liver Function Tests No results for input(s): AST, ALT, ALKPHOS, BILITOT, PROT, ALBUMIN in the last 72 hours. No results for input(s): LIPASE, AMYLASE in the last 72 hours. Cardiac Enzymes No results for input(s): CKTOTAL, CKMB, CKMBINDEX, TROPONINI in the last 72 hours.  BNP: BNP (last 3 results) No results for input(s): BNP in the last 8760 hours.  ProBNP (last 3 results) No results for input(s): PROBNP in the last 8760 hours.   D-Dimer No results for input(s): DDIMER in the last 72 hours. Hemoglobin A1C No results for input(s): HGBA1C in the last 72 hours. Fasting Lipid Panel No results for input(s): CHOL, HDL, LDLCALC, TRIG, CHOLHDL, LDLDIRECT in the last 72 hours. Thyroid Function Tests No results for input(s): TSH, T4TOTAL, T3FREE, THYROIDAB in the last 72 hours.  Invalid input(s): FREET3  Other results:   Imaging     No results found.   Medications:     Scheduled Medications: . Chlorhexidine Gluconate Cloth  6 each Topical Daily  . divalproex  500 mg Oral Daily  . insulin aspart  0-9 Units Subcutaneous TID WC  . isosorbide mononitrate  30 mg Oral Daily  . metoprolol succinate  50 mg Oral Daily  . pantoprazole (PROTONIX) IV  40 mg Intravenous QHS  . potassium chloride  40 mEq Oral BID  . rosuvastatin  10 mg Oral Daily  . senna-docusate  1 tablet Oral BID  . torsemide  80 mg Oral q morning - 10a   And  . torsemide  60 mg Oral QHS     Infusions: . clevidipine Stopped (08/25/20 1312)  . heparin 600 Units/hr (08/30/20 0800)     PRN Medications:  acetaminophen **OR** acetaminophen (TYLENOL) oral liquid 160 mg/5 mL **OR** acetaminophen, benzonatate, labetalol, meclizine, ondansetron (ZOFRAN) IV    Assessment/Plan    1. 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  - heparin gtt started 1/3 per neurology.  - INR 1.0 today  - Long term, will have her stop ASA.  When  warfarin restarted, will aim for INR 2.5-3 for the time being.  2. Atrial fibrillation: Chronic. Rate well controlled.  - Continue Toprol XL 50 mg daily.  - Continue heparin gtt.  3. Mechanical mitral valve: Had been on ASA 81 daily and coumadin with INR goal 2.5-3.5. Needs endocarditis prophylaxis with any dental work. Mechanical MV looked ok on echo this admission (elevated mean gradient but PHT not prolonged).  - Continue heparin gtt  - Long term, will have her stop ASA.  When warfarin restarted, will aim for INR 2.5-3 for the time being.  4. Tricuspid valve repair: Stable by echo in12/21,TR appears mild-moderate with no significant stenosis. 5. Chronic diastolic QTM:AUQJ this admission with normal EF, mildly decreased RV systolic function. Volume status looks ok but w/ mild AKI.  -Continue home torsemide 80 qam/60 qpm and KCl.  - monitor SCr/BUN. May need to hold PM dose of torsemide until PO intake improves  6. CAD: coronary CTA in 8/20 showed nonobstructive CAD, suspect chest pain was noncardiac.She still has episodes of atypical chest pain. - Continue Crestor 10 mg daily.  Length of Stay: [address] PA-C  08/30/2020, 8:09 AM  Advanced Heart Failure Team Pager [phone] (M-F; 7a - 4p)  Please contact [facility] for night-coverage after hours (4p -7a ) and weekends on [email or web address]  Patient seen with PA, agree with the above note.   Still with headache and dizziness but improved from yesterday.  On heparin gtt now, no warfarin yet. She has restarted her home torsemide with lower creatinine and stable K.   Start warfarin when ok with neuro.  Would keep her off ASA and aim for lower INR goal 2.5-3.   Marca Ancona 08/30/2020 1:40 PM

## 2020-08-30 NOTE — Hospital Course (Addendum)
[name],  You presented to hospital due to bleeding in a brain structure called cerebellum, which is called hemorrhagic stroke. We suspected it due to high levels of warfarin, leading to bleeding.  We stopped your warfarin in hospital and after you

## 2020-08-30 NOTE — Progress Notes (Signed)
Occupational Therapy Treatment Patient Details Name: Deborah [name] MRN: [id number] DOB: May 11, 1951 Today's Date: 08/30/2020    History of present illness The pt is a 70 yo female presenting with severe headache, nausea, and vomiting. 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 PMH includes: chronic afib, gout, HLD, HTN, obesity,   OT comments  Pt tolerated OOB to chair this session total +2 Mod (A) with gaze stabilization utilized. Pt requires x2 rest breaks to continue to progress this session. Recommendation for CIR at this time.    Follow Up Recommendations  CIR    Equipment Recommendations  3 in 1 bedside commode    Recommendations for Other Services Rehab consult    Precautions / Restrictions Precautions Precautions: Fall Precaution Comments: dizziness with mobility, used gaze stabilization Restrictions Weight Bearing Restrictions: No       Mobility Bed Mobility Overal bed mobility: Needs Assistance Bed Mobility: Supine to Sit Rolling: Mod assist   Supine to sit: Mod assist;+2 for physical assistance     General bed mobility comments: had pt focus on non-moving object, brough legs to EOB, HOB very elevated to do helicopter transfer to minimize dizziness  Transfers Overall transfer level: Needs assistance Equipment used: 2 person hand held assist Transfers: Sit to/from Stand Sit to Stand: Mod assist;+2 physical assistance         General transfer comment: modA to power up due to pt fear of dizziness and nausa, used gaze stabilization    Balance Overall balance assessment: Needs assistance Sitting-balance support: Feet unsupported;Single extremity supported Sitting balance-Leahy Scale: Poor Sitting balance - Comments: posterior lean   Standing balance support: Bilateral upper extremity supported Standing balance-Leahy Scale: Poor Standing balance comment: reliant on bilat HHA                            ADL either performed or assessed with clinical judgement   ADL Overall ADL's : Needs assistance/impaired Eating/Feeding: Moderate assistance;Bed level Eating/Feeding Details (indicate cue type and reason): daughter feeding patient. pt states "ate alot" Grooming: Wash/dry face;Modified independent;Bed level Grooming Details (indicate cue type and reason): blowing nose in bed on arrival . pt noted to remove large clot from nasal passage                 Toilet Transfer: +2 for physical assistance;Moderate assistance           Functional mobility during ADLs: +2 for physical assistance;Moderate assistance General ADL Comments: session focused on basic transfer to help with 3n1 transfers and OOB to chair for meals. pt has prolonged bed level care for this stay and needs to progress oob     Vision       Perception     Praxis      Cognition Arousal/Alertness: Awake/alert Behavior During Therapy: WFL for tasks assessed/performed Overall Cognitive Status: Within Functional Limits for tasks assessed                                 General Comments: pt able to follow commands consistently        Exercises     Shoulder Instructions       General Comments VSS, nauseated at end of session after movement but not producing any vomit during session    Pertinent Vitals/ Pain       Pain Assessment: No/denies pain Pain Intervention(s): Monitored during session (  nauseated)  Home Living                                          Prior Functioning/Environment              Frequency  Min 2X/week        Progress Toward Goals  OT Goals(current goals can now be found in the care plan section)  Progress towards OT goals: Progressing toward goals  Acute Rehab OT Goals Patient Stated Goal: to cut her hair - daughter plans to cut hair shorter OT Goal Formulation: With patient/family Time For Goal Achievement: 09/09/20 Potential to  Achieve Goals: Good ADL Goals Pt Will Perform Grooming: with min assist;bed level Additional ADL Goal #1: Pt will complete bed mobility supervision level for adls Additional ADL Goal #2: Pt will complete basic transfer min (A) as precursor to adls.  Plan Discharge plan remains appropriate    Co-evaluation    PT/OT/SLP Co-Evaluation/Treatment: Yes Reason for Co-Treatment: Complexity of the patient's impairments (multi-system involvement);To address functional/ADL transfers PT goals addressed during session: Mobility/safety with mobility OT goals addressed during session: ADL's and self-care;Proper use of Adaptive equipment and DME      AM-PAC OT "6 Clicks" Daily Activity     Outcome Measure   Help from another person eating meals?: A Lot Help from another person taking care of personal grooming?: A Lot Help from another person toileting, which includes using toliet, bedpan, or urinal?: A Lot Help from another person bathing (including washing, rinsing, drying)?: A Lot Help from another person to put on and taking off regular upper body clothing?: A Lot Help from another person to put on and taking off regular lower body clothing?: A Lot 6 Click Score: 12    End of Session Equipment Utilized During Treatment: Gait belt;Rolling walker  OT Visit Diagnosis: Unsteadiness on feet (R26.81);Muscle weakness (generalized) (M62.81)   Activity Tolerance Patient limited by fatigue   Patient Left in chair;with call bell/phone within reach;with chair alarm set;with family/visitor present   Nurse Communication Mobility status;Precautions        Time: 1610-9604 OT Time Calculation (min): 18 min  Charges: OT General Charges $OT Visit: 1 Visit OT Treatments $Self Care/Home Management : 8-22 mins   Brynn, OTR/L  Acute Rehabilitation Services Pager: [phone] Office: [phone] .    Mateo Flow 08/30/2020, 2:43 PM

## 2020-08-30 NOTE — Progress Notes (Signed)
Physical Therapy Treatment Patient Details Name: Deborah [name] MRN: [id number] DOB: 03/24/1951 Today's Date: 08/30/2020    History of Present Illness The pt is a 70 yo female presenting with severe headache, nausea, and vomiting. 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 PMH includes: chronic afib, gout, HLD, HTN, obesity,    PT Comments    Pt with improved dizziness management this session allowing pt to begin ambulation progression with modAx2. Pt continues with dizziness but minimal nausea. Used gaze stabilization t/o session which appeared to help some. Pt continues to be very unsteady and at increased falls risk. Acute PT to cont to follow.    Follow Up Recommendations  CIR     Equipment Recommendations   (TBD)    Recommendations for Other Services Rehab consult     Precautions / Restrictions Precautions Precautions: Fall Precaution Comments: dizziness with mobility, used gaze stabilization Restrictions Weight Bearing Restrictions: No    Mobility  Bed Mobility Overal bed mobility: Needs Assistance Bed Mobility: Supine to Sit Rolling: Mod assist   Supine to sit: Mod assist;+2 for physical assistance     General bed mobility comments: had pt focus on non-moving object, brough legs to EOB, HOB very elevated to do helicopter transfer to minimize dizziness  Transfers Overall transfer level: Needs assistance Equipment used: 2 person hand held assist Transfers: Sit to/from Stand Sit to Stand: Mod assist;+2 physical assistance         General transfer comment: modA to power up due to pt fear of dizziness and nausa, used gaze stabilization  Ambulation/Gait Ambulation/Gait assistance: Mod assist;+2 physical assistance Gait Distance (Feet): 15 Feet (x2) Assistive device: 2 person hand held assist Gait Pattern/deviations: Step-through pattern;Decreased stride length;Narrow base of support Gait velocity: slow   General Gait  Details: pt very cautious and guarded with lateral sway due to dizziness. seated rest break required, pt dry heaving at end of 2nd bout   Stairs             Wheelchair Mobility    Modified Rankin (Stroke Patients Only) Modified Rankin (Stroke Patients Only) Pre-Morbid Rankin Score: No symptoms Modified Rankin: Moderately severe disability     Balance Overall balance assessment: Needs assistance Sitting-balance support: Feet unsupported;Single extremity supported Sitting balance-Leahy Scale: Poor Sitting balance - Comments: posterior lean   Standing balance support: Bilateral upper extremity supported Standing balance-Leahy Scale: Poor Standing balance comment: reliant on bilat HHA                            Cognition Arousal/Alertness: Awake/alert Behavior During Therapy: WFL for tasks assessed/performed Overall Cognitive Status: Within Functional Limits for tasks assessed                                 General Comments: pt able to follow commands consistently      Exercises      General Comments General comments (skin integrity, edema, etc.): VSS      Pertinent Vitals/Pain Pain Assessment: No/denies pain Pain Intervention(s): Monitored during session    Home Living                      Prior Function            PT Goals (current goals can now be found in the care plan section) Acute Rehab PT Goals PT Goal Formulation: With patient/family  Time For Goal Achievement: 09/09/20 Potential to Achieve Goals: Good Progress towards PT goals: Progressing toward goals    Frequency    Min 4X/week      PT Plan Current plan remains appropriate    Co-evaluation PT/OT/SLP Co-Evaluation/Treatment: Yes Reason for Co-Treatment: Complexity of the patient's impairments (multi-system involvement) PT goals addressed during session: Mobility/safety with mobility        AM-PAC PT "6 Clicks" Mobility   Outcome Measure  Help  needed turning from your back to your side while in a flat bed without using bedrails?: A Little Help needed moving from lying on your back to sitting on the side of a flat bed without using bedrails?: A Lot Help needed moving to and from a bed to a chair (including a wheelchair)?: A Lot Help needed standing up from a chair using your arms (e.g., wheelchair or bedside chair)?: A Lot Help needed to walk in hospital room?: A Lot Help needed climbing 3-5 steps with a railing? : Total 6 Click Score: 12    End of Session Equipment Utilized During Treatment: Gait belt Activity Tolerance: Patient tolerated treatment well Patient left: in chair;with call bell/phone within reach;with chair alarm set;with family/visitor present Nurse Communication: Mobility status PT Visit Diagnosis: Other symptoms and signs involving the nervous system (R29.898)     Time: 8101-7510 PT Time Calculation (min) (ACUTE ONLY): 19 min  Charges:  $Gait Training: 8-22 mins                     Lewis Shock, PT, DPT Acute Rehabilitation Services Pager #: [phone] Office #: [phone]    Iona Hansen 08/30/2020, 2:14 PM

## 2020-08-31 ENCOUNTER — Other Ambulatory Visit: Payer: Self-pay | Admitting: Family Medicine

## 2020-08-31 ENCOUNTER — Inpatient Hospital Stay (HOSPITAL_COMMUNITY): Payer: Medicare Other

## 2020-08-31 DIAGNOSIS — I5032 Chronic diastolic (congestive) heart failure: Secondary | ICD-10-CM | POA: Diagnosis not present

## 2020-08-31 LAB — GLUCOSE, CAPILLARY
Glucose-Capillary: 170 mg/dL — ABNORMAL HIGH (ref 70–99)
Glucose-Capillary: 203 mg/dL — ABNORMAL HIGH (ref 70–99)
Glucose-Capillary: 258 mg/dL — ABNORMAL HIGH (ref 70–99)
Glucose-Capillary: 204 mg/dL — ABNORMAL HIGH (ref 70–99)
Glucose-Capillary: 229 mg/dL — ABNORMAL HIGH (ref 70–99)

## 2020-08-31 LAB — BASIC METABOLIC PANEL
Anion gap: 12 (ref 5–15)
BUN: 38 mg/dL — ABNORMAL HIGH (ref 8–23)
CO2: 23 mmol/L (ref 22–32)
Calcium: 10.4 mg/dL — ABNORMAL HIGH (ref 8.9–10.3)
Chloride: 99 mmol/L (ref 98–111)
Creatinine, Ser: 1.34 mg/dL — ABNORMAL HIGH (ref 0.44–1.00)
GFR, Estimated: 43 mL/min — ABNORMAL LOW (ref >60)
Glucose, Bld: 217 mg/dL — ABNORMAL HIGH (ref 70–99)
Potassium: 3.6 mmol/L (ref 3.5–5.1)
Sodium: 134 mmol/L — ABNORMAL LOW (ref 135–145)

## 2020-08-31 LAB — PROTIME-INR
INR: 1 (ref 0.8–1.2)
Prothrombin Time: 12.7 seconds (ref 11.4–15.2)

## 2020-08-31 LAB — HEPARIN LEVEL (UNFRACTIONATED)
Heparin Unfractionated: 0.31 IU/mL (ref 0.30–0.70)
Heparin Unfractionated: 0.32 IU/mL (ref 0.30–0.70)

## 2020-08-31 NOTE — Progress Notes (Addendum)
ANTICOAGULATION CONSULT NOTE  Pharmacy Consult for heparin Indication: Mechanical mitral valve  Allergies  Allergen Reactions  . Promethazine Hcl Other (See Comments)    REACTION: made her real shaky    Patient Measurements: Height: 4\' 11"  (149.9 cm) Weight: 69.2 kg (152 lb 8.9 oz) IBW/kg (Calculated) : 43.2 Heparin Dosing Weight: 69.2 kg   Vital Signs: Temp: 97.7 F (36.5 C) (01/05 0800) Temp Source: Oral (01/05 0800) BP: 141/95 (01/05 1006) Pulse Rate: 83 (01/05 1006)  Labs: Recent Labs    08/29/20 0202 08/29/20 1745 08/30/20 0405 08/30/20 1807 08/31/20 0248 08/31/20 1049  HGB 11.7*  --  13.3  --   --   --   HCT 36.4  --  40.2  --   --   --   PLT 170  --  203  --   --   --   LABPROT 12.8  --  13.3  --  12.7  --   INR 1.0  --  1.1  --  1.0  --   HEPARINUNFRC  --    < > 1.07* 0.24* 0.31  --   CREATININE 1.17*  --  1.29*  --   --  1.34*   < > = values in this interval not displayed.    Estimated Creatinine Clearance: 34.5 mL/min (A) (by C-G formula based on SCr of 1.34 mg/dL (H)).   Medical History: Past Medical History:  Diagnosis Date  . Allergic rhinitis   . Anemia   . Borderline diabetes   . Carpal tunnel syndrome   . Chronic atrial fibrillation (HCC)    coumadin managed by [facility]. CVRR;  Event montor (7/12-8/12) showed no high rate episodes (patient continuously in atrial fibrillation).   . Chronic diastolic heart failure (HCC)   . Eosinophilia 01/27/2013  . External hemorrhoid   . External hemorrhoids   . Fatigue   . Gout   . 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 07/01/2012  . Hx of cardiovascular stress test    ETT-myoview (7/12): No evidence for ischemia or infarction  . Hyperlipidemia   . Hypertension   . Obesity   . 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 Rotator cuff syndrome     Medications:  Medications Prior to Admission  Medication Sig Dispense Refill Last Dose  . acetaminophen (TYLENOL) 500 MG tablet Take 500-1,000 mg by mouth every 6 (six) hours as needed for fever (pain).   08/24/2020 at Unknown time  . allopurinol (ZYLOPRIM) 300 MG tablet TAKE 1 TABLET BY MOUTH EVERY DAY (Patient taking differently: Take 300 mg by mouth daily.) 90 tablet 3 08/24/2020 at Unknown time  . aspirin 81 MG chewable tablet Chew 81 mg by mouth daily.   08/24/2020 at Unknown time  . cetirizine (ZYRTEC) 10 MG tablet Take 10 mg by mouth daily as needed for allergies.   Past Week  . cyanocobalamin 500 MCG tablet Take 1 tablet (500 mcg total) by mouth daily. 30 tablet 5 08/24/2020 at Unknown time  . cycloSPORINE (RESTASIS) 0.05 % ophthalmic emulsion Place 1 drop into both eyes daily as  needed (dry eyes).   Past Month at Unknown time  . diazepam (VALIUM) 5 MG tablet Take 5 mg by mouth every 8 (eight) hours as needed for anxiety.    Past Week at Unknown time  . diclofenac Sodium (VOLTAREN) 1 % GEL APPLY 2 GRAMS TO AFFECTED AREA 4 TIMES A DAY (Patient taking differently: Apply 2 g topically 4 (four) times daily.) 100 g 3 Past Month  . famotidine (PEPCID) 20 MG tablet Take 20 mg by mouth daily.   08/24/2020 at Unknown time  . folic acid (FOLVITE) 1 MG tablet TAKE 1 TABLET BY MOUTH EVERY DAY (Patient taking differently: Take 1 mg by mouth daily.) 90 tablet 3 08/24/2020 at Unknown time  . isosorbide mononitrate (IMDUR) 30 MG 24 hr tablet Take 30 mg by mouth daily.   08/24/2020 at Unknown time  . meclizine (ANTIVERT) 25 MG tablet TAKE 1 TABLET (25 MG TOTAL) BY MOUTH 3 (THREE) TIMES DAILY AS NEEDED FOR DIZZINESS. 30 tablet 0 Past Month at Unknown time  . metFORMIN (GLUCOPHAGE) 500 MG tablet TAKE 1  TABLET (500 MG TOTAL) BY MOUTH 2 (TWO) TIMES DAILY WITH A MEAL. 180 tablet 1 08/24/2020 at Unknown time  . metoprolol succinate (TOPROL-XL) 50 MG 24 hr tablet TAKE 1 TABLET BY MOUTH EVERY DAY (Patient taking differently: Take 50 mg by mouth daily.) 90 tablet 3 08/24/2020 at 0800  . nitroGLYCERIN (NITROSTAT) 0.4 MG SL tablet Place 0.4 mg under the tongue every 5 (five) minutes as needed for chest pain.   one year ago  . Olopatadine HCl 0.7 % SOLN Place 1 drop into both eyes daily as needed (itching/ irritation).   Past Week  . omeprazole (PRILOSEC) 20 MG capsule TAKE 1 CAPSULE 2 (TWO) TIMES DAILY BEFORE A MEAL. TAKE WITH OMEPRAZOLE 40 MG TO EQUAL 60 MG (Patient taking differently: Take 20 mg by mouth 2 (two) times daily before a meal.) 180 capsule 3 08/24/2020 at Unknown time  . potassium chloride SA (KLOR-CON M20) 20 MEQ tablet Take 3 tablets (60 mEq total) by mouth 2 (two) times daily. 540 tablet 2 08/24/2020 at Unknown time  . rosuvastatin (CRESTOR) 10 MG tablet TAKE 1 TABLET BY MOUTH EVERY DAY (Patient taking differently: Take 10 mg by mouth daily.) 90 tablet 3 08/24/2020 at Unknown time  . torsemide (DEMADEX) 20 MG tablet TAKE 4 TABLETS (80 MG TOTAL) EVERY MORNING AND 3 TABLETS (60 MG TOTAL) EVERY EVENING. (Patient taking differently: Take 60-80 mg by mouth See admin instructions. TAKE 4 TABLETS (80 MG TOTAL) EVERY MORNING AND 3 TABLETS (60 MG TOTAL) EVERY EVENING.) 630 tablet 1 08/24/2020 at Unknown time  . warfarin (COUMADIN) 2 MG tablet TAKE 1/2-1 TABLET BY MOUTH EVERY DAY OR AS DIRECTED BY COUMADIN CLINIC (Patient taking differently: Take 1-2 mg by mouth See admin instructions. Take 2 mg every Tue, Thu, Sat; 1 mg all other days.) 75 tablet 1 08/22/2020 at 6pm  . zinc sulfate 220 (50 Zn) MG capsule Take 220 mg by mouth daily.   08/24/2020 at Unknown time  . ACCU-CHEK SOFTCLIX LANCETS lancets USE AS DIRECTED TO test blood sugar 100 each 6   . glucose blood (ACCU-CHEK AVIVA PLUS) test strip 1 each by  Other route as needed for other. Use as needed for symptoms of low or high blood sugars 100 strip 12   . Menthol-Methyl Salicylate (TIGER BALM LINIMENT EX) Apply 1 application topically daily.       Assessment: 54 YOF on warfarin at home for h/o  mechanical mitral valve presented to the ED on 12/29 with cerebellar vermis hemorrhage. Warfarin was reversed with 74F-PCC and vitamin K and anticoagulation has been on hold since. Pharmacy consulted to resuming IV heparin per stroke protocol.  Heparin level this AM is therapeutic at 0.31. H/H and Plt wnl. SCr up to 1.34  Goal of Therapy:  Heparin level 0.3-0.5 units/ml Monitor platelets by anticoagulation protocol: Yes   Plan:  -Continue IV heparin at 700 units/h -Recheck confirmatory heparin level in 8h   Vinnie Level, PharmD., BCPS, BCCCP Clinical Pharmacist Please refer to [facility] for unit-specific pharmacist    Addendum: Repeat HL remains low therapeutic at 0.32. Continue IV heparin at 700 units/hr and monitor daily HL.   Vinnie Level, PharmD., BCPS, BCCCP Clinical Pharmacist Please refer to [facility] for unit-specific pharmacist

## 2020-08-31 NOTE — Progress Notes (Addendum)
STROKE TEAM PROGRESS NOTE   INTERVAL HISTORY No acute overnight events. Patient evaluated at bedside this morning. She states she has some nausea this morning but overall feels better. States was able to tolerate PT/OT better yesterday. Headache is improving as well. Patient's Heparin level is 0.31 this morning with ongoing IV Heparin drip @700  units/hr. It was 0.24 on 600 units/hr. CT head this morning does not show any significant interval change in size of intraparenchymal hemorrhage centered at the cerebellar vermis, similar surrounding mild edema without significant regional mass effect.No other new acute intracranial abnormality.Transfer order's has been placed to neurological unit. CIR would most probably take the patient on Heparin drip. Blood pressure controlled. Neurological exam stable.    Vitals:   08/31/20 0800 08/31/20 0900 08/31/20 1000 08/31/20 1006  BP: 112/68 140/88  (!) 141/95  Pulse: 87 (!) 154 88 83  Resp: 19 (!) 25 15 19   Temp: 97.7 F (36.5 C)     TempSrc: Oral     SpO2: 94% 95% 96% 93%  Weight:      Height:       CBC:  Recent Labs  Lab 08/24/20 2003 08/24/20 2158 08/29/20 0202 08/30/20 0405  WBC 10.1   < > 7.5 8.5  NEUTROABS 4.4  --   --   --   HGB 11.0*   < > 11.7* 13.3  HCT 36.3   < > 36.4 40.2  MCV 76.7*   < > 74.1* 74.0*  PLT 201   < > 170 203   < > = values in this interval not displayed.   Basic Metabolic Panel:  Recent Labs  Lab 08/29/20 0202 08/30/20 0405  NA 140 139  K 3.9 4.3  CL 101 101  CO2 26 26  GLUCOSE 147* 159*  BUN 18 29*  CREATININE 1.17* 1.29*  CALCIUM 9.6 10.2   Lipid Panel:  Recent Labs  Lab 08/24/20 2115  CHOL 150  TRIG 170*  HDL 45  CHOLHDL 3.3  VLDL 34  LDLCALC 71   HgbA1c:  Recent Labs  Lab 08/24/20 2154  HGBA1C 7.5*    IMAGING past 24 hours  CT head wo contrast  09/01/19 IMPRESSION: 1. No significant interval change in size of intraparenchymal hemorrhage centered at the cerebellar vermis. Similar  surrounding mild edema without significant regional mass effect. 2. No other new acute intracranial abnormality.   DG CHEST PORT 1 VIEW 08/26/2020 IMPRESSION:   1. Marked cardiac enlargement with pulmonary vascular congestion.  2. Nodular opacity in the LEFT upper chest versus vessel on end. Follow-up PA and lateral chest is suggested to ensure resolution and or CT of the chest on follow-up after resolution of acute process.  3. No lobar consolidative changes. No overt pulmonary edema.  4. Question of LEFT lower lobe atelectasis, attention on follow-up.   ECHOCARDIOGRAM COMPLETE 08/26/2020  IMPRESSIONS   1. Left ventricular ejection fraction, by estimation, is 55 to 60%. The left ventricle has normal function. The left ventricle has no regional wall motion abnormalities. There is mild left ventricular hypertrophy. Left ventricular diastolic parameters are indeterminate.   2. Right ventricular systolic function is mildly reduced. The right ventricular size is normal. There is moderately elevated pulmonary artery systolic pressure. The estimated right ventricular systolic pressure is 96.2 mmHg.   3. Left atrial size was severely dilated.   4. Right atrial size was severely dilated.   5. 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 Mean gradient 9 mmHg but PHT 76 msec (short).  Suspect that she does not have hemodynamically significant mitral mitral stenosis and there does not appear to be significant mitral regurgitation.   6. Status post tricuspid valve repair. Mean gradient 6 mmHg with mild-moderate tricuspid regurgitation.   7. The aortic valve is tricuspid. Aortic valve regurgitation is mild. Mild aortic valve sclerosis is present, with no evidence of aortic valve stenosis.   8. The inferior vena cava is dilated in size with <50% respiratory variability, suggesting right atrial pressure of 15 mmHg.   9. The patient is in atrial fibrillation.   PHYSICAL EXAM   Temp:  [97.7 F (36.5 C)-99.3 F  (37.4 C)] 97.7 F (36.5 C) (01/05 0800) Pulse Rate:  [46-154] 83 (01/05 1006) Resp:  [14-25] 19 (01/05 1006) BP: (90-152)/(54-132) 141/95 (01/05 1006) SpO2:  [91 %-96 %] 93 % (01/05 1006)  General -pleasant middle-age Asian [place] lady sitting comfortably in bed, NAD  Cardiovascular - irregularly irregular heart rate and rhythm.  Psych: Normal mood and affect  Neurological Exam:  Awake, alert and interactive. Oriented to time place and person.   No dysarthria and no aphasia, able to name and repeat, follows all simple commands.    Visual field full, no gaze palsy, no significant nystagmus.. Facial symmetrical, tongue midline. PERRL.  Moving b/l against gravity symmetrical, moving b/l LEs 3/5 proximal and 4/5 distally. Sensation symmetrical. B/l FTN grossly intact without ataxia. Gait not tested.   ASSESSMENT/PLAN Ms. Rachal Dvorsky is a 70 y.o. 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 CT scan shows cerebellar vermis hemorrhage  likely due to warfarin coagulopathy with supratherapeutic INR  ICH:            MRI:  Stable cerebellar vermis ICH  CT head: cerebellar vermis small hemorrhage  CTA head & neck unremarkable  Repeat CT head x 2 stable hemorrhage and mass effect  CT head: No significant interval change in size of intraparenchymal hemorrhage centered at the cerebellar vermis. Similar surrounding mild edema without significant regional mass effect.  2D Echo - EF 55-60%. No source of embolus. LA severely dilated ; mechanical mitral valve ; tricuspid valve repair ; patient was in atrial fibrillation.   LDL 71  HgbA1c 7.5  VTE prophylaxis - None  aspirin 81 mg daily and warfarin daily prior to admission, now on IV heparin drip  Therapy recommendations:  CIR recommended- will take patient on heparin drip  Disposition:   pending  Chronic afib Mechanical MV TV repair   On warfarin at home  INR 4.3->2.1->kcentra and vitK->1.1  Increase IV Heparin drip to 700 units/hr  and f/u of Heparin Level after 8 hrs and then daily along with CBC and any signs of bleeding.  Pt does have high risk of embolic event with mechanical valve at this time - discussed with daughter and son (phone)  CXR - 08/26/20 - Marked cardiac enlargement with pulmonary vascular congestion.  Demadex PTA - 80 mg Q AM - 60 mg QPM -> resumed 08/26/20 (cardiologist - Laurey Morale, MD - [facility])  Potassium 3.5->3.8>4.3  Hypertension  Home meds:  Imdur, metoprolol, torsemide  Stable  Off cleviprex now . Continue imdur and metoprolol . Continue torsemide and potassium  . Long-term BP goal normotensive . Labetalol PRN  Hyperlipidemia  Home meds: Crestor 10  LDL 71, goal < 70  C/w Crestor 10 mg   Diabetes  type II Uncontrolled  Home meds:  metformin  HgbA1c 7.5, goal < 7.0  CBGs   SSI changed from sensitive to moderate for better control.  PCP close follow up   Other Stroke Risk Factors  Advanced Age >/= 9   Obesity, Body mass index is 30.81 kg/m., BMI >/= 30 associated with increased stroke risk, recommend weight loss, diet and exercise as appropriate   Other Active Problems, Findings and Recommendations  HA, nausea and vomiting secondary to ICH. Sx management  CKD IIIa, Cre 1.45->1.30->1.29->1.15->1.15>1.17>1.29  Nodular opacity in the LEFT upper chest versus vessel on end. Follow-up PA and lateral chest is suggested to ensure resolution and or CT of the chest on follow-up after resolution of acute process. Repeat CXR  08/28/20 - Stable moderate cardiomegaly without pulmonary edema. No active  pulmonary disease.  Consider Chest CT as per radiology and note from Dr Thomasena Edis regarding conversation with pt's daughter.  Consider cardiology consult. Pt sees Dr [name] - [facility].  Arnoldo Lenis,  MD PGY-1 Resident  Attending Note :  Patient neurological exam remains stable.  IV heparin drip was  therapeutic this morning  Pharmacy is closely monitoring follow up heparin levels will plan to to keep it in the low therapeutic range.  Plan restart warfarin after 2 weeks of hemorrhage and not continue aspirin long-term. Continue ongoing therapies and hopefully transfer to inpatient rehab later this week.  Long discussion of the bedside with the patient and daughter and answered questions.  .This patient is critically ill and at significant risk of neurological worsening, death and care requires constant monitoring of vital signs, hemodynamics,respiratory and cardiac monitoring, extensive review of multiple databases, frequent neurological assessment, discussion with family, other specialists and medical decision making of high complexity.I have made any additions or clarifications directly to the above note.This critical care time does not reflect procedure time, or teaching time or supervisory time of PA/NP/Med Resident etc but could involve care discussion time.  I spent 30 minutes of neurocritical care time  in the care of  this patient.   Delia Heady, MD Medical Director [facility] Pager: [phone] 08/31/2020 10:41 AM   To contact Stroke Continuity provider, please refer to [email or web address]. After hours, contact General Neurology

## 2020-08-31 NOTE — Progress Notes (Signed)
Inpatient Rehab Admissions Coordinator:    I have a CIR bed for this patient and will plan to admit today. RN may call [phone] for report.   Megan Salon, MS, CCC-SLP Rehab Admissions Coordinator  [phone] (celll) [phone] (office)

## 2020-08-31 NOTE — Progress Notes (Addendum)
Advanced Heart Failure Rounding Note  PCP-Cardiologist: No primary care provider on file.   Subjective:    Heparin gtt started. INR 1.0 today. Neuro exam ok. CT today unchanged.   INR 1  Complaining of nausea with movement. Denies chest pain.   Objective:   Weight Range: 69.2 kg Body mass index is 30.81 kg/m.   Vital Signs:   Temp:  [97.7 F (36.5 C)-99.3 F (37.4 C)] 97.7 F (36.5 C) (01/05 0800) Pulse Rate:  [46-154] 83 (01/05 1006) Resp:  [14-25] 19 (01/05 1006) BP: (90-152)/(54-132) 141/95 (01/05 1006) SpO2:  [91 %-96 %] 93 % (01/05 1006) Last BM Date: 08/30/20  Weight change: Filed Weights   08/24/20 1955 08/25/20 0305  Weight: 67.8 kg 69.2 kg    Intake/Output:   Intake/Output Summary (Last 24 hours) at 08/31/2020 1027 Last data filed at 08/31/2020 0900 Gross per 24 hour  Intake 390.76 ml  Output 2325 ml  Net -1934.24 ml      Physical Exam   General:   No resp difficulty HEENT: normal Neck: supple. no JVD. Carotids 2+ bilat; no bruits. No lymphadenopathy or thryomegaly appreciated. Cor: PMI nondisplaced. Irregular rate & rhythm. No rubs, gallops or murmurs. Lungs: clear Abdomen: soft, nontender, nondistended. No hepatosplenomegaly. No bruits or masses. Good bowel sounds. Extremities: no cyanosis, clubbing, rash, edema Neuro: alert & orientedx3, cranial nerves grossly intact. moves all 4 extremities w/o difficulty. Affect flat     Telemetry   Afib 80s  EKG    No new EKG to review   Labs    CBC Recent Labs    08/29/20 0202 08/30/20 0405  WBC 7.5 8.5  HGB 11.7* 13.3  HCT 36.4 40.2  MCV 74.1* 74.0*  PLT 170 203   Basic Metabolic Panel Recent Labs    62/69/48 0202 08/30/20 0405  NA 140 139  K 3.9 4.3  CL 101 101  CO2 26 26  GLUCOSE 147* 159*  BUN 18 29*  CREATININE 1.17* 1.29*  CALCIUM 9.6 10.2   Liver Function Tests No results for input(s): AST, ALT, ALKPHOS, BILITOT, PROT, ALBUMIN in the last 72 hours. No results for  input(s): LIPASE, AMYLASE in the last 72 hours. Cardiac Enzymes No results for input(s): CKTOTAL, CKMB, CKMBINDEX, TROPONINI in the last 72 hours.  BNP: BNP (last 3 results) No results for input(s): BNP in the last 8760 hours.  ProBNP (last 3 results) No results for input(s): PROBNP in the last 8760 hours.   D-Dimer No results for input(s): DDIMER in the last 72 hours. Hemoglobin A1C No results for input(s): HGBA1C in the last 72 hours. Fasting Lipid Panel No results for input(s): CHOL, HDL, LDLCALC, TRIG, CHOLHDL, LDLDIRECT in the last 72 hours. Thyroid Function Tests No results for input(s): TSH, T4TOTAL, T3FREE, THYROIDAB in the last 72 hours.  Invalid input(s): FREET3  Other results:   Imaging    CT HEAD WO CONTRAST  Result Date: 08/31/2020 CLINICAL DATA:  Follow-up examination for acute stroke. EXAM: CT HEAD WITHOUT CONTRAST TECHNIQUE: Contiguous axial images were obtained from the base of the skull through the vertex without intravenous contrast. COMPARISON:  Prior CT from 08/29/2020. FINDINGS: Brain: Previously identified hemorrhage centered at the central cerebellar vermis is not significantly changed in size, measuring 1.8 x 2.0 x 1.6 cm. Hemorrhage itself is slightly less dense as compared to previous exam. Similar surrounding vasogenic edema without significant regional mass effect. Adjacent fourth ventricle remains widely patent. No hydrocephalus. No other new intracranial hemorrhage or  large vessel territory infarct. Chronic microvascular ischemic disease again noted. No midline shift or extra-axial fluid collection. Vascular: No hyperdense vessel. Scattered vascular calcifications noted within the carotid siphons. Skull: Scalp soft tissues and calvarium are stable in appearance with no new finding. Sinuses/Orbits: Globes and orbital soft tissues demonstrate no acute finding. Paranasal sinuses remain clear. No mastoid effusion. Other: None. IMPRESSION: 1. No significant  interval change in size of intraparenchymal hemorrhage centered at the cerebellar vermis. Similar surrounding mild edema without significant regional mass effect. 2. No other new acute intracranial abnormality. Electronically Signed   By: Rise Mu M.D.   On: 08/31/2020 04:46     Medications:     Scheduled Medications: . Chlorhexidine Gluconate Cloth  6 each Topical Daily  . divalproex  500 mg Oral Daily  . insulin aspart  0-15 Units Subcutaneous TID WC  . isosorbide mononitrate  30 mg Oral Daily  . metoprolol succinate  50 mg Oral Daily  . pantoprazole  40 mg Oral QHS  . potassium chloride  40 mEq Oral BID  . rosuvastatin  10 mg Oral Daily  . senna-docusate  1 tablet Oral BID  . torsemide  80 mg Oral q morning - 10a   And  . torsemide  60 mg Oral QHS    Infusions: . clevidipine Stopped (08/25/20 1312)  . heparin 700 Units/hr (08/31/20 0900)    PRN Medications: acetaminophen **OR** acetaminophen (TYLENOL) oral liquid 160 mg/5 mL **OR** acetaminophen, benzonatate, labetalol, meclizine, ondansetron (ZOFRAN) IV    Assessment/Plan    1. 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  - heparin gtt started 1/3 per neurology.  - INR 1.0 today  - Long term, will have her stop ASA.  When warfarin restarted, will aim for INR 2.5-3 for the time being.  2. Atrial fibrillation: Chronic. Rate controlled.   - Continue Toprol XL 50 mg daily.  - Continue heparin gtt.  3. Mechanical mitral valve: Had been on ASA 81 daily and coumadin with INR goal 2.5-3.5. Needs endocarditis prophylaxis with any dental work. Mechanical MV looked ok on echo this admission (elevated mean gradient but PHT not prolonged).  - Continue heparin gtt  - Long term, will have her stop ASA.  When warfarin restarted, will aim for INR 2.5-3 for the time being.  4. Tricuspid valve repair: Stable by echo in12/21,TR appears mild-moderate with no significant stenosis. 5. Chronic  diastolic EOF:HQRF this admission with normal EF, mildly decreased RV systolic function.  Volume status stable. Check BMET in am.   -Continue home torsemide 80 qam/60 qpm and KCl.  - monitor SCr/BUN. May need to hold PM dose of torsemide until PO intake improves  6. CAD: coronary CTA in 8/20 showed nonobstructive CAD, suspect chest pain was noncardiac.She still has episodes of atypical chest pain.No chest pain today.  - Continue Crestor 10 mg daily.  Length of Stay: 7  Amy Clegg, NP  08/31/2020, 10:27 AM  Advanced Heart Failure Team Pager [phone] (M-F; 7a - 4p)  Please contact [facility] for night-coverage after hours (4p -7a ) and weekends on [email or web address]  Agree with the above note.   Stable currently on heparin gtt.  Start warfarin when ok with neurology.  Will get BMET today.  Marca Ancona 08/31/2020

## 2020-08-31 NOTE — Progress Notes (Signed)
Inpatient Rehab Admissions Coordinator:   ICT of Pt.'s head appeared stable this AM and MD states she is medically ready to d/c to CIR; however, I  do not have a bed on CIR for Pt. Today. I notified her daughter over the phone. .She remains interested in CIR.  Megan Salon, MS, CCC-SLP Rehab Admissions Coordinator  [phone] (celll) [phone] (office)

## 2020-08-31 NOTE — H&P (Signed)
Physical Medicine and Rehabilitation Admission H&P    Chief Complaint  Patient presents with  . Weakness  : HPI: Deborah [name] is a 70 year old right-handed female with history of borderline diabetes mellitus, chronic atrial fibrillation rheumatic heart disease status post mitral valve replacement tricuspid replacement maintained on chronic Coumadin, hypertension, hyperlipidemia, obesity with BMI 30.81.  History taken from chart review, daughter, and patient.  Patient lives with her family independent prior to admission. 1 level home 3 steps to entry independent and active. She does not drive.  She presented on 08/24/2020 with headache, nausea, vomiting and decreased mobility x2 days. 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 No downward herniation. CT abdomen pelvis showed large amount of stool no other abnormality identified. Chest x-ray showed marked cardiac enlargement with pulmonary vascular congestion. Admission chemistries hemoglobin 11, blood cultures no growth, lactic acid 2.9 INR 2.1 troponin negative. 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 Cardiology services continues to follow for cardiac history.  Follow-up cranial CT scan 08/31/2020 relatively stable size of intraparenchymal hemorrhage as compared to prior tracings.  Was on Depakote for headaches, however daughter states patient does not take this medication anymore.. Blood pressure monitored patient did require Cleviprex.  Echocardiogram ejection fraction 55 to 60%, no wall motion abnormalities. Currently on a regular diet. Due to patient's decreased functional ability therapy evaluations completed and patient was admitted for a comprehensive rehab program.  Patient with resulting functional deficits with  mobility, balance, endurance, self-care.  Please see preadmission assessment from earlier today as well.  Review of Systems  Constitutional: Positive for diaphoresis and malaise/fatigue. Negative for chills and fever.  HENT: Negative for hearing loss.   Eyes: Negative for blurred vision and double vision.  Respiratory: Positive for shortness of breath.   Gastrointestinal: Positive for constipation, nausea and vomiting. Negative for heartburn.  Genitourinary: Negative for dysuria, flank pain and hematuria.  Musculoskeletal: Positive for myalgias.  Skin: Negative for rash.  Neurological: Positive for dizziness, focal weakness and headaches. Negative for sensory change and speech change.  All other systems reviewed and are negative.  Past Medical History:  Diagnosis Date  . Allergic rhinitis   . Anemia   . Borderline diabetes   . Carpal tunnel syndrome   . Chronic atrial fibrillation (HCC)    coumadin managed by [facility]. CVRR;  Event montor (7/12-8/12) showed no high rate episodes (patient continuously in atrial fibrillation).   . Chronic diastolic heart failure (HCC)   . Eosinophilia 01/27/2013  . External hemorrhoid   . External hemorrhoids   . Fatigue   . Gout   . 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 Gaylyn Rong  . Hx of cardiovascular stress test    ETT-myoview (7/12): No evidence for ischemia or infarction  . Hyperlipidemia   . Hypertension   . Obesity   . Rheumatic heart disease    Status post mechanical ([employer]) mitral valve replacement and tricuspid valve repair at [facility] in 2002; echo 5/12:   EF 60-65%, mild AI, mitral valve prosthesis with AVA 1.66, severe LAE, moderate RAE, moderate to severe TR, mild increased pulmonary artery systolic pressure;    Adenosine Cardiolite in 4/09:   No ischemia, EF 66%  . Rotator cuff syndrome    Past Surgical History:  Procedure Laterality Date  . CARDIAC CATHETERIZATION     EF 55%  .  TRICUSPID VALVE REPAIR     Family History  Problem Relation Age of Onset  . Stroke Mother   . Diabetes Mother   . Heart failure Mother        pacemaker  . Heart disease Father        "heart problems"  . Heart disease Sister   . Heart disease Brother   . Gout Brother   . Diabetes Brother   . Stroke Brother   . Valvular heart disease Sister    Social History:  reports that she has never smoked. She has never used smokeless tobacco. She reports that she does not drink alcohol and does not use drugs. Allergies:  Allergies  Allergen Reactions  . Promethazine Hcl Other (See Comments)    REACTION: made her real shaky   Medications Prior to Admission  Medication Sig Dispense Refill  . ACCU-CHEK SOFTCLIX LANCETS lancets USE AS DIRECTED TO test blood sugar 100 each 6  . acetaminophen (TYLENOL) 500 MG tablet Take 500-1,000 mg by mouth every 6 (six) hours as needed for fever (pain).    Marland Kitchen allopurinol (ZYLOPRIM) 300 MG tablet TAKE 1 TABLET BY MOUTH EVERY DAY (Patient taking differently: Take 300 mg by mouth daily.) 90 tablet 3  . aspirin 81 MG chewable tablet Chew 81 mg by mouth daily.    . cetirizine (ZYRTEC) 10 MG tablet Take 10 mg by mouth daily as needed for allergies.    . cyanocobalamin 500 MCG tablet Take 1 tablet (500 mcg total) by mouth daily. 30 tablet 5  . cycloSPORINE (RESTASIS) 0.05 % ophthalmic emulsion Place 1 drop into both eyes daily as needed (dry eyes).    . diazepam (VALIUM) 5 MG tablet Take 5 mg by mouth every 8 (eight) hours as needed for anxiety.     . diclofenac Sodium (VOLTAREN) 1 % GEL APPLY 2 GRAMS TO AFFECTED AREA 4 TIMES A DAY (Patient taking differently: Apply 2 g topically 4 (four) times daily.) 100 g 3  . famotidine (PEPCID) 20 MG tablet Take 20 mg by mouth daily.    . folic acid (FOLVITE) 1 MG tablet TAKE 1 TABLET BY MOUTH EVERY DAY (Patient taking differently: Take 1 mg by mouth daily.) 90 tablet 3  . glucose blood (ACCU-CHEK AVIVA PLUS) test strip 1 each by  Other route as needed for other. Use as needed for symptoms of low or high blood sugars 100 strip 12  . isosorbide mononitrate (IMDUR) 30 MG 24 hr tablet Take 30 mg by mouth daily.    . meclizine (ANTIVERT) 25 MG tablet TAKE 1 TABLET (25 MG TOTAL) BY MOUTH 3 (THREE) TIMES DAILY AS NEEDED FOR DIZZINESS. 30 tablet 0  . Menthol-Methyl Salicylate (TIGER BALM LINIMENT EX) Apply 1 application topically daily.    . metFORMIN (GLUCOPHAGE) 500 MG tablet TAKE 1 TABLET (500 MG TOTAL) BY MOUTH 2 (TWO) TIMES DAILY WITH A MEAL. 180 tablet 1  . metoprolol succinate (TOPROL-XL) 50 MG 24 hr tablet TAKE 1 TABLET BY MOUTH EVERY DAY (Patient taking differently: Take 50 mg by mouth daily.) 90 tablet 3  . nitroGLYCERIN (NITROSTAT) 0.4 MG SL tablet Place 0.4 mg under the tongue every 5 (five) minutes as needed for chest pain.    Marland Kitchen Olopatadine HCl 0.7 % SOLN Place 1 drop into both eyes daily as needed (itching/ irritation).    Marland Kitchen omeprazole (PRILOSEC) 20 MG capsule TAKE 1 CAPSULE  2 (TWO) TIMES DAILY BEFORE A MEAL. TAKE WITH OMEPRAZOLE 40 MG TO EQUAL 60 MG (Patient taking differently: Take 20 mg by mouth 2 (two) times daily before a meal.) 180 capsule 3  . potassium chloride SA (KLOR-CON M20) 20 MEQ tablet Take 3 tablets (60 mEq total) by mouth 2 (two) times daily. 540 tablet 2  . rosuvastatin (CRESTOR) 10 MG tablet TAKE 1 TABLET BY MOUTH EVERY DAY (Patient taking differently: Take 10 mg by mouth daily.) 90 tablet 3  . torsemide (DEMADEX) 20 MG tablet TAKE 4 TABLETS (80 MG TOTAL) EVERY MORNING AND 3 TABLETS (60 MG TOTAL) EVERY EVENING. (Patient taking differently: Take 60-80 mg by mouth See admin instructions. TAKE 4 TABLETS (80 MG TOTAL) EVERY MORNING AND 3 TABLETS (60 MG TOTAL) EVERY EVENING.) 630 tablet 1  . zinc sulfate 220 (50 Zn) MG capsule Take 220 mg by mouth daily.      Drug Regimen Review Drug regimen was reviewed and remains appropriate with no significant issues identified  Home: Home Living Family/patient  expects to be discharged to:: Private residence Living Arrangements: Children,Spouse/significant other Available Help at Discharge: Family,Available 24 hours/day Type of Home: House Home Access: Stairs to enter [employer] of Steps: front 3; back 9 Entrance Stairs-Rails: None (front steps) Home Layout: One level Bathroom Shower/Tub: Engineer, manufacturing systems: Standard Home Equipment: None   Functional History: Prior Function Level of Independence: Independent Comments: ADLs and IADLs. Enjoys walking for exercises. Does not drive  Functional Status:  Mobility: Bed Mobility Overal bed mobility: Needs Assistance Bed Mobility: Rolling,Sidelying to Sit,Sit to Sidelying Rolling: Min assist Sidelying to sit: Mod assist Supine to sit: Mod assist,+2 for physical assistance Sit to supine: Mod assist,+2 for physical assistance Sit to sidelying: Min assist General bed mobility comments: PT attempts to provide instruction on gaze stabilization during mobility, pt with some difficulty following  commnads for eye movement followed by head movement Transfers Overall transfer level: Needs assistance Equipment used: Rolling walker (2 wheeled) Transfers: Sit to/from [employer] Sit to Stand: Mod assist,Min assist (modA initially progressing to minA) Stand pivot transfers: Min assist General transfer comment: modA to power up due to pt fear of dizziness and nausa, used gaze stabilization Ambulation/Gait Ambulation/Gait assistance: Min assist Gait Distance (Feet): 9 Feet Assistive device: Rolling walker (2 wheeled) Gait Pattern/deviations: Step-to pattern General Gait Details: pt with short step length and slowed gait speed, limited by dizziness during session Gait velocity: reduced Gait velocity interpretation: <1.31 ft/sec, indicative of household ambulator    ADL: ADL Overall ADL's : Needs assistance/impaired Eating/Feeding: Moderate assistance,Bed  level Eating/Feeding Details (indicate cue type and reason): daughter feeding patient. pt states "ate alot" Grooming: Wash/dry face,Modified independent,Bed level Grooming Details (indicate cue type and reason): blowing nose in bed on arrival . pt noted to remove large clot from nasal passage Upper Body Bathing: Maximal assistance Lower Body Bathing: Total assistance Toilet Transfer: +2 for physical assistance,Moderate assistance Functional mobility during ADLs: +2 for physical assistance,Moderate assistance General ADL Comments: session focused on basic transfer to help with 3n1 transfers and OOB to chair for meals. pt has prolonged bed level care for this stay and needs to progress oob  Cognition: Cognition Overall Cognitive Status: Within Functional Limits for tasks assessed Arousal/Alertness:  (awake) Orientation Level: Oriented X4 Attention: Sustained Sustained Attention: Appears intact Memory: Appears intact Awareness: Appears intact Problem Solving: Appears intact Safety/Judgment: Appears intact Cognition Arousal/Alertness: Awake/alert Behavior During Therapy: WFL for tasks assessed/performed Overall Cognitive Status: Within Functional  Limits for tasks assessed General Comments: pt able to follow commands consistently  Physical Exam: Blood pressure 130/82, pulse 77, temperature 97.8 F (36.6 C), temperature source Oral, resp. rate 18, height 4\' 11"  (1.499 m), weight 67 kg, SpO2 96 %. Physical Exam Vitals reviewed.  Constitutional:      General: She is not in acute distress.    Appearance: She is not ill-appearing.  HENT:     Head: Normocephalic and atraumatic.     Right Ear: External ear normal.     Nose: Nose normal.  Eyes:     General:        Right eye: No discharge.        Left eye: No discharge.     Extraocular Movements: Extraocular movements intact.  Cardiovascular:     Comments: Irregularly irregular Pulmonary:     Effort: Pulmonary effort is normal. No  respiratory distress.     Breath sounds: No stridor.  Abdominal:     General: Abdomen is flat. Bowel sounds are normal. There is no distension.  Musculoskeletal:     Cervical back: Normal range of motion and neck supple.     Comments: No edema or tenderness in extremities  Skin:    General: Skin is warm and dry.  Neurological:     Mental Status: She is alert.     Comments: Alert and oriented x3 Speech is low tone but provides her name and place. Follows simple commands. Motor: RUE/RLE: 5/5 proximal distal LUE: 4/5 proximal distal LLE: 4-4+/5 proximal to distal  Psychiatric:        Mood and Affect: Mood normal. Affect is flat.        Behavior: Behavior normal.     Results for orders placed or performed during the hospital encounter of 08/24/20 (from the past 48 hour(s))  Glucose, capillary     Status: Abnormal   Collection Time: 08/31/20  5:03 PM  Result Value Ref Range   Glucose-Capillary 204 (H) 70 - 99 mg/dL    Comment: Glucose reference range applies only to samples taken after fasting for at least 8 hours.  Glucose, capillary     Status: Abnormal   Collection Time: 08/31/20  9:35 PM  Result Value Ref Range   Glucose-Capillary 229 (H) 70 - 99 mg/dL    Comment: Glucose reference range applies only to samples taken after fasting for at least 8 hours.   Comment 1 Notify RN    Comment 2 Document in Chart   Heparin level (unfractionated)     Status: None   Collection Time: 09/01/20  2:39 AM  Result Value Ref Range   Heparin Unfractionated 0.32 0.30 - 0.70 IU/mL    Comment: (NOTE) If heparin results are below expected values, and patient dosage has  been confirmed, suggest follow up testing of antithrombin III levels. Performed at [facility], [place] [address] [place], [place] [address]   CBC     Status: Abnormal   Collection Time: 09/01/20  2:39 AM  Result Value Ref Range   WBC 9.1 4.0 - 10.5 K/uL   RBC 5.87 (H) 3.87 - 5.11 MIL/uL   Hemoglobin 13.6 12.0 - 15.0  g/dL   HCT 43.1 36.0 - 46.0 %   MCV 73.4 (L) 80.0 - 100.0 fL   MCH 23.2 (L) 26.0 - 34.0 pg   MCHC 31.6 30.0 - 36.0 g/dL   RDW 16.4 (H) 11.5 - 15.5 %   Platelets 192 150 - 400 K/uL  nRBC 0.0 0.0 - 0.2 %    Comment: Performed at [id number], [address] [place], [place] [address]  Basic metabolic panel     Status: Abnormal   Collection Time: 09/01/20  2:39 AM  Result Value Ref Range   Sodium 134 (L) 135 - 145 mmol/L   Potassium 4.0 3.5 - 5.1 mmol/L   Chloride 96 (L) 98 - 111 mmol/L   CO2 24 22 - 32 mmol/L   Glucose, Bld 179 (H) 70 - 99 mg/dL    Comment: Glucose reference range applies only to samples taken after fasting for at least 8 hours.   BUN 42 (H) 8 - 23 mg/dL   Creatinine, Ser 4.74 (H) 0.44 - 1.00 mg/dL   Calcium 25.9 (H) 8.9 - 10.3 mg/dL   GFR, Estimated 41 (L) >60 mL/min    Comment: (NOTE) Calculated using the CKD-EPI Creatinine Equation (2021)    Anion gap 14 5 - 15    Comment: Performed at [facility], [address] [place], [place] [address]  Glucose, capillary     Status: Abnormal   Collection Time: 09/01/20  6:02 AM  Result Value Ref Range   Glucose-Capillary 201 (H) 70 - 99 mg/dL    Comment: Glucose reference range applies only to samples taken after fasting for at least 8 hours.   Comment 1 Notify RN    Comment 2 Document in Chart   Glucose, capillary     Status: Abnormal   Collection Time: 09/01/20 12:10 PM  Result Value Ref Range   Glucose-Capillary 256 (H) 70 - 99 mg/dL    Comment: Glucose reference range applies only to samples taken after fasting for at least 8 hours.  Glucose, capillary     Status: Abnormal   Collection Time: 09/01/20  3:52 PM  Result Value Ref Range   Glucose-Capillary 292 (H) 70 - 99 mg/dL    Comment: Glucose reference range applies only to samples taken after fasting for at least 8 hours.  Glucose, capillary     Status: Abnormal   Collection Time: 09/01/20  9:06 PM  Result Value Ref Range   Glucose-Capillary  189 (H) 70 - 99 mg/dL    Comment: Glucose reference range applies only to samples taken after fasting for at least 8 hours.   Comment 1 Notify RN    Comment 2 Document in Chart   Heparin level (unfractionated)     Status: None   Collection Time: 09/24/2020  1:31 AM  Result Value Ref Range   Heparin Unfractionated 0.30 0.30 - 0.70 IU/mL    Comment: (NOTE) If heparin results are below expected values, and patient dosage has  been confirmed, suggest follow up testing of antithrombin III levels. Performed at [facility], [address], [place], [place] [address]   Basic metabolic panel     Status: Abnormal   Collection Time: 09/18/2020  1:31 AM  Result Value Ref Range   Sodium 134 (L) 135 - 145 mmol/L   Potassium 4.8 3.5 - 5.1 mmol/L   Chloride 99 98 - 111 mmol/L   CO2 23 22 - 32 mmol/L   Glucose, Bld 193 (H) 70 - 99 mg/dL    Comment: Glucose reference range applies only to samples taken after fasting for at least 8 hours.   BUN 44 (H) 8 - 23 mg/dL   Creatinine, Ser 2.95 (H) 0.44 - 1.00 mg/dL   Calcium 18.8 (H) 8.9 - 10.3 mg/dL  GFR, Estimated 33 (L) >60 mL/min    Comment: (NOTE) Calculated using the CKD-EPI Creatinine Equation (2021)    Anion gap 12 5 - 15    Comment: Performed at [facility], [place] [address] [place], [place] [address]  Glucose, capillary     Status: Abnormal   Collection Time: 09-10-20  6:17 AM  Result Value Ref Range   Glucose-Capillary 213 (H) 70 - 99 mg/dL    Comment: Glucose reference range applies only to samples taken after fasting for at least 8 hours.   Comment 1 Notify RN    Comment 2 Document in Chart   Glucose, capillary     Status: Abnormal   Collection Time: 2020-09-10 12:00 PM  Result Value Ref Range   Glucose-Capillary 197 (H) 70 - 99 mg/dL    Comment: Glucose reference range applies only to samples taken after fasting for at least 8 hours.   No results found.     Medical Problem List and Plan: 1. 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  Antithrombotics: -DVT/anticoagulation: Intravenous heparin  -antiplatelet therapy: N/A 3. Pain Management: Tylenol as needed 4. Mood: Provide emotional support  -antipsychotic agents: N/A 5. Neuropsych: This patient is capable of making decisions on her own behalf. 6. Skin/Wound Care: Routine skin checks 7. Fluids/Electrolytes/Nutrition: Routine in and outs  CMP ordered 8. Headaches. Depakote DC'd  Tylenol as needed 9. 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 Follow-up cardiology services  Monitor with increased exertion 10. Diabetes mellitus with hyperglycemia. Hemoglobin A1c 7.5. SSI. Patient on Glucophage 500 mg twice daily prior to admission. Resume as needed  Monitor with increased mobility 11. Hypertension.  Imdur 30 mg daily, Demadex 80 mg morning 60 mg nightly, metoprolol-XL 50 mg daily.   Monitor with increased mobility 12.  Hyperlipidemia: Crestor 13. Obesity. BMI 30.81. Dietary follow-up  Cathlyn Parsons, PA-C 09/10/20  I have personally performed a face to face diagnostic evaluation, including, but not limited to relevant history and physical exam findings, of this patient and developed relevant assessment and plan.  Additionally, I have reviewed and concur with the physician assistant's documentation above.  Delice Lesch, MD, ABPMR

## 2020-08-31 NOTE — Progress Notes (Signed)
Inpatient Rehab Admissions Coordinator:   Pt.'s daughter has declined CIR admit at this time. Will follow up tomorrow.  Megan Salon, MS, CCC-SLP Rehab Admissions Coordinator  [phone] (celll) [phone] (office)

## 2020-09-01 DIAGNOSIS — I5032 Chronic diastolic (congestive) heart failure: Secondary | ICD-10-CM | POA: Diagnosis not present

## 2020-09-01 LAB — BASIC METABOLIC PANEL
Anion gap: 14 (ref 5–15)
BUN: 42 mg/dL — ABNORMAL HIGH (ref 8–23)
CO2: 24 mmol/L (ref 22–32)
Calcium: 11 mg/dL — ABNORMAL HIGH (ref 8.9–10.3)
Chloride: 96 mmol/L — ABNORMAL LOW (ref 98–111)
Creatinine, Ser: 1.4 mg/dL — ABNORMAL HIGH (ref 0.44–1.00)
GFR, Estimated: 41 mL/min — ABNORMAL LOW (ref >60)
Glucose, Bld: 179 mg/dL — ABNORMAL HIGH (ref 70–99)
Potassium: 4 mmol/L (ref 3.5–5.1)
Sodium: 134 mmol/L — ABNORMAL LOW (ref 135–145)

## 2020-09-01 LAB — GLUCOSE, CAPILLARY
Glucose-Capillary: 201 mg/dL — ABNORMAL HIGH (ref 70–99)
Glucose-Capillary: 256 mg/dL — ABNORMAL HIGH (ref 70–99)
Glucose-Capillary: 292 mg/dL — ABNORMAL HIGH (ref 70–99)
Glucose-Capillary: 189 mg/dL — ABNORMAL HIGH (ref 70–99)

## 2020-09-01 LAB — CBC
HCT: 43.1 % (ref 36.0–46.0)
Hemoglobin: 13.6 g/dL (ref 12.0–15.0)
MCH: 23.2 pg — ABNORMAL LOW (ref 26.0–34.0)
MCHC: 31.6 g/dL (ref 30.0–36.0)
MCV: 73.4 fL — ABNORMAL LOW (ref 80.0–100.0)
Platelets: 192 10*3/uL (ref 150–400)
RBC: 5.87 MIL/uL — ABNORMAL HIGH (ref 3.87–5.11)
RDW: 16.4 % — ABNORMAL HIGH (ref 11.5–15.5)
WBC: 9.1 10*3/uL (ref 4.0–10.5)
nRBC: 0 % (ref 0.0–0.2)

## 2020-09-01 LAB — HEPARIN LEVEL (UNFRACTIONATED): Heparin Unfractionated: 0.32 IU/mL (ref 0.30–0.70)

## 2020-09-01 NOTE — Progress Notes (Addendum)
STROKE TEAM PROGRESS NOTE   INTERVAL HISTORY No acute overnight events. Patient evaluated at bedside this morning with daughter present in room. She states she threw up twice this morning after feeling dizzy but feels much better now. Notes able to tolerate physical activity better than yesterday. States she she stopped taking Depakote as she does not have headache anymore and had only 1 episode last night which terminated without medication. Patient is encouraged to eat and drink well.  Patient had a bed available in CIR yesterday but pt's daughter declined. Rehab administrator has been contacted again and patient will be able to go to CIR with IV Heparin drip as she is medically stable.  Most recent Heparin level is 0.32 and IV heparin drip is continued at 700 units/hr. Blood pressure well controlled. Neurological exam stable and unchanged.   Vitals:   08/31/20 2007 08/31/20 2346 09/01/20 0323 09/01/20 0851  BP: (!) 132/96 120/85 (!) 124/93 (!) 139/98  Pulse: 78 80 66 62  Resp: 18 18 18 18   Temp: 97.7 F (36.5 C) 98 F (36.7 C) 98 F (36.7 C) 98.1 F (36.7 C)  TempSrc: Oral Oral Oral Oral  SpO2: 95% 98% 97% 98%  Weight: 67 kg     Height: 4\' 11"  (1.499 m)      CBC:  Recent Labs  Lab 08/30/20 0405 09/01/20 0239  WBC 8.5 9.1  HGB 13.3 13.6  HCT 40.2 43.1  MCV 74.0* 73.4*  PLT 203 237   Basic Metabolic Panel:  Recent Labs  Lab 08/31/20 1049 09/01/20 0239  NA 134* 134*  K 3.6 4.0  CL 99 96*  CO2 23 24  GLUCOSE 217* 179*  BUN 38* 42*  CREATININE 1.34* 1.40*  CALCIUM 10.4* 11.0*    IMAGING past 24 hours  CT head wo contrast  09/01/19 IMPRESSION: 1. No significant interval change in size of intraparenchymal hemorrhage centered at the cerebellar vermis. Similar surrounding mild edema without significant regional mass effect. 2. No other new acute intracranial abnormality.   DG CHEST PORT 1 VIEW 08/26/2020 IMPRESSION:   1. Marked cardiac enlargement with  pulmonary vascular congestion.  2. Nodular opacity in the LEFT upper chest versus vessel on end. Follow-up PA and lateral chest is suggested to ensure resolution and or CT of the chest on follow-up after resolution of acute process.  3. No lobar consolidative changes. No overt pulmonary edema.  4. Question of LEFT lower lobe atelectasis, attention on follow-up.   ECHOCARDIOGRAM COMPLETE 08/26/2020  IMPRESSIONS   1. Left ventricular ejection fraction, by estimation, is 55 to 60%. The left ventricle has normal function. The left ventricle has no regional wall motion abnormalities. There is mild left ventricular hypertrophy. Left ventricular diastolic parameters are indeterminate.   2. Right ventricular systolic function is mildly reduced. The right ventricular size is normal. There is moderately elevated pulmonary artery systolic pressure. The estimated right ventricular systolic pressure is 62.8 mmHg.   3. Left atrial size was severely dilated.   4. Right atrial size was severely dilated.   5. 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 Mean gradient 9 mmHg but PHT 76 msec (short). Suspect that she does not have hemodynamically significant mitral mitral stenosis and there does not appear to be significant mitral regurgitation.   6. Status post tricuspid valve repair. Mean gradient 6 mmHg with mild-moderate tricuspid regurgitation.   7. The aortic valve is tricuspid. Aortic valve regurgitation is mild. Mild aortic valve sclerosis is present, with no evidence of aortic  valve stenosis.   8. The inferior vena cava is dilated in size with <50% respiratory variability, suggesting right atrial pressure of 15 mmHg.   9. The patient is in atrial fibrillation.   PHYSICAL EXAM   Temp:  [97.7 F (36.5 C)-98.1 F (36.7 C)] 98.1 F (36.7 C) (01/06 0851) Pulse Rate:  [42-95] 62 (01/06 0851) Resp:  [11-20] 18 (01/06 0851) BP: (106-139)/(78-98) 139/98 (01/06 0851) SpO2:  [92 %-98 %] 98 % (01/06 0851) Weight:  [67  kg] 67 kg (01/05 2007)  General -pleasant middle-age Asian [place] lady sitting comfortably in bed, NAD  Cardiovascular - irregularly irregular heart rate and rhythm.  Psych: Normal mood and affect  Neurological Exam:  Awake, alert and interactive. Oriented to time, place and person.   No dysarthria and no aphasia, able to name and repeat, follows all simple commands.    Visual field full, no gaze palsy, no significant nystagmus.. Facial symmetrical, tongue midline. PERRL.  Moving b/l UEs against gravity symmetrical, moving b/l LEs 3/5 proximal and 5/5 distally. Sensation symmetrical. B/l FTN grossly intact without ataxia. Gait not tested.   ASSESSMENT/PLAN [name] is a 70 y.o. 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 CT scan shows cerebellar vermis hemorrhage  likely due to warfarin coagulopathy with supratherapeutic INR  ICH:            MRI:  Stable cerebellar vermis ICH  CT head: cerebellar vermis small hemorrhage  CTA head & neck unremarkable  Repeat CT head x 2 stable hemorrhage and mass effect  CT head: No significant interval change in size of intraparenchymal hemorrhage centered at the cerebellar vermis. Similar surrounding mild edema without significant regional mass effect.  2D Echo - EF 55-60%. No source of embolus. LA severely dilated ; mechanical mitral valve ; tricuspid valve repair ; patient was in atrial fibrillation.   LDL 71  HgbA1c 7.5  VTE prophylaxis - None  aspirin 81 mg daily and warfarin daily prior to admission, now on IV heparin drip  Therapy recommendations:  CIR recommended- will take patient on heparin drip, awaiting bed availability  Disposition:  pending  Chronic afib Mechanical MV TV repair   On warfarin at home  INR 4.3->2.1->kcentra and vitK->1.1  Continue IV Heparin drip  at 700 units/hr  and f/u of Heparin Level after 8 hrs and then daily along with CBC and any signs of bleeding.  Pt does have high risk of embolic event with mechanical valve at this time - discussed with daughter and son (phone)  CXR - 08/26/20 - Marked cardiac enlargement with pulmonary vascular congestion.  Demadex PTA - 80 mg Q AM - 60 mg QPM -> resumed 08/26/20 (cardiologist - Laurey Morale, MD - [facility])  Potassium 3.5->3.8>4.3>4.0  Hypertension  Home meds:  Imdur, metoprolol, torsemide  Stable  Off cleviprex now . Continue imdur and metoprolol . Continue torsemide and potassium  . Long-term BP goal normotensive . Labetalol PRN  Hyperlipidemia  Home meds: Crestor 10  LDL 71, goal < 70  C/w Crestor 10 mg   Diabetes type II Uncontrolled  Home meds:  metformin  HgbA1c 7.5, goal < 7.0  CBGs   SSI changed from sensitive to moderate for better control.  PCP close follow up   Other Stroke Risk Factors  Advanced Age >/= 54   Obesity, Body mass index  is 29.83 kg/m., BMI >/= 30 associated with increased stroke risk, recommend weight loss, diet and exercise as appropriate   Other Active Problems, Findings and Recommendations  HA, nausea and vomiting secondary to ICH. Sx management  CKD IIIa, Cre 1.45->1.30->1.29->1.15->1.15>1.17>1.29>1.40  Nodular opacity in the LEFT upper chest versus vessel on end. Follow-up PA and lateral chest is suggested to ensure resolution and or CT of the chest on follow-up after resolution of acute process. Repeat CXR  08/28/20 - Stable moderate cardiomegaly without pulmonary edema. No active  pulmonary disease.  Consider Chest CT as per radiology and note from Dr Thomasena Edis regarding conversation with pt's daughter.  Consider cardiology consult. Pt sees Dr [name] - [facility].  Deborah Lenis, MD PGY-1 Resident  Attending Note :  Patient neurological exam remains stable.  IV heparin drip remains therapeutic this morning   Pharmacy is closely monitoring follow up heparin levels will plan to to keep it in the low therapeutic range.  Plan restart warfarin after 2 weeks of hemorrhage and not continue aspirin long-term. Continue ongoing therapies and hopefully transfer to inpatient rehab later this week.  Long discussion of the bedside with the patient and daughter and answered questions.  Greater than 50% time during this 35-minute visit was spent on counseling and coordination of care and discussion with care team and answering questions.Delia Heady, MD Medical Director [facility] Pager: [phone] 09/01/2020 11:46 AM   To contact Stroke Continuity provider, please refer to [email or web address]. After hours, contact General Neurology

## 2020-09-01 NOTE — Progress Notes (Signed)
ANTICOAGULATION CONSULT NOTE  Pharmacy Consult for heparin Indication: Mechanical mitral valve  Allergies  Allergen Reactions  . Promethazine Hcl Other (See Comments)    REACTION: made her real shaky    Patient Measurements: Height: 4\' 11"  (149.9 cm) Weight: 67 kg (147 lb 11.3 oz) IBW/kg (Calculated) : 43.2 Heparin Dosing Weight: 69.2 kg   Vital Signs: Temp: 98 F (36.7 C) (01/06 0323) Temp Source: Oral (01/06 0323) BP: 124/93 (01/06 0323) Pulse Rate: 66 (01/06 0323)  Labs: Recent Labs    08/30/20 0405 08/30/20 1807 08/31/20 0248 08/31/20 1049 08/31/20 1244 09/01/20 0239  HGB 13.3  --   --   --   --  13.6  HCT 40.2  --   --   --   --  43.1  PLT 203  --   --   --   --  192  LABPROT 13.3  --  12.7  --   --   --   INR 1.1  --  1.0  --   --   --   HEPARINUNFRC 1.07*   < > 0.31  --  0.32 0.32  CREATININE 1.29*  --   --  1.34*  --  1.40*   < > = values in this interval not displayed.    Estimated Creatinine Clearance: 32.4 mL/min (A) (by C-G formula based on SCr of 1.4 mg/dL (H)).   Medical History: Past Medical History:  Diagnosis Date  . Allergic rhinitis   . Anemia   . Borderline diabetes   . Carpal tunnel syndrome   . Chronic atrial fibrillation (HCC)    coumadin managed by [facility]. CVRR;  Event montor (7/12-8/12) showed no high rate episodes (patient continuously in atrial fibrillation).   . Chronic diastolic heart failure (HCC)   . Eosinophilia 01/27/2013  . External hemorrhoid   . External hemorrhoids   . Fatigue   . Gout   . 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 07/01/2012  . Hx of cardiovascular stress test    ETT-myoview (7/12): No evidence for ischemia or infarction  . Hyperlipidemia   . Hypertension   . Obesity   . Rheumatic heart disease    Status post mechanical ([employer]) mitral valve replacement and tricuspid valve repair at [facility] in 2002; echo 5/12:   EF 60-65%, mild AI, mitral valve  prosthesis with AVA 1.66, severe LAE, moderate RAE, moderate to severe TR, mild increased pulmonary artery systolic pressure;    Adenosine Cardiolite in 4/09:   No ischemia, EF 66%  . Rotator cuff syndrome     Medications:  Medications Prior to Admission  Medication Sig Dispense Refill Last Dose  . acetaminophen (TYLENOL) 500 MG tablet Take 500-1,000 mg by mouth every 6 (six) hours as needed for fever (pain).   08/24/2020 at Unknown time  . allopurinol (ZYLOPRIM) 300 MG tablet TAKE 1 TABLET BY MOUTH EVERY DAY (Patient taking differently: Take 300 mg by mouth daily.) 90 tablet 3 08/24/2020 at Unknown time  . aspirin 81 MG chewable tablet Chew 81 mg by mouth daily.   08/24/2020 at Unknown time  . cetirizine (ZYRTEC) 10 MG tablet Take 10 mg by mouth daily as needed for allergies.   Past Week  . cyanocobalamin 500 MCG tablet Take 1 tablet (500 mcg total) by mouth daily. 30 tablet 5 08/24/2020 at Unknown time  . cycloSPORINE (RESTASIS) 0.05 % ophthalmic emulsion Place 1 drop into both eyes  daily as needed (dry eyes).   Past Month at Unknown time  . diazepam (VALIUM) 5 MG tablet Take 5 mg by mouth every 8 (eight) hours as needed for anxiety.    Past Week at Unknown time  . diclofenac Sodium (VOLTAREN) 1 % GEL APPLY 2 GRAMS TO AFFECTED AREA 4 TIMES A DAY (Patient taking differently: Apply 2 g topically 4 (four) times daily.) 100 g 3 Past Month  . famotidine (PEPCID) 20 MG tablet Take 20 mg by mouth daily.   08/24/2020 at Unknown time  . folic acid (FOLVITE) 1 MG tablet TAKE 1 TABLET BY MOUTH EVERY DAY (Patient taking differently: Take 1 mg by mouth daily.) 90 tablet 3 08/24/2020 at Unknown time  . isosorbide mononitrate (IMDUR) 30 MG 24 hr tablet Take 30 mg by mouth daily.   08/24/2020 at Unknown time  . meclizine (ANTIVERT) 25 MG tablet TAKE 1 TABLET (25 MG TOTAL) BY MOUTH 3 (THREE) TIMES DAILY AS NEEDED FOR DIZZINESS. 30 tablet 0 Past Month at Unknown time  . metFORMIN (GLUCOPHAGE) 500 MG tablet TAKE 1  TABLET (500 MG TOTAL) BY MOUTH 2 (TWO) TIMES DAILY WITH A MEAL. 180 tablet 1 08/24/2020 at Unknown time  . metoprolol succinate (TOPROL-XL) 50 MG 24 hr tablet TAKE 1 TABLET BY MOUTH EVERY DAY (Patient taking differently: Take 50 mg by mouth daily.) 90 tablet 3 08/24/2020 at 0800  . nitroGLYCERIN (NITROSTAT) 0.4 MG SL tablet Place 0.4 mg under the tongue every 5 (five) minutes as needed for chest pain.   one year ago  . Olopatadine HCl 0.7 % SOLN Place 1 drop into both eyes daily as needed (itching/ irritation).   Past Week  . omeprazole (PRILOSEC) 20 MG capsule TAKE 1 CAPSULE 2 (TWO) TIMES DAILY BEFORE A MEAL. TAKE WITH OMEPRAZOLE 40 MG TO EQUAL 60 MG (Patient taking differently: Take 20 mg by mouth 2 (two) times daily before a meal.) 180 capsule 3 08/24/2020 at Unknown time  . potassium chloride SA (KLOR-CON M20) 20 MEQ tablet Take 3 tablets (60 mEq total) by mouth 2 (two) times daily. 540 tablet 2 08/24/2020 at Unknown time  . rosuvastatin (CRESTOR) 10 MG tablet TAKE 1 TABLET BY MOUTH EVERY DAY (Patient taking differently: Take 10 mg by mouth daily.) 90 tablet 3 08/24/2020 at Unknown time  . torsemide (DEMADEX) 20 MG tablet TAKE 4 TABLETS (80 MG TOTAL) EVERY MORNING AND 3 TABLETS (60 MG TOTAL) EVERY EVENING. (Patient taking differently: Take 60-80 mg by mouth See admin instructions. TAKE 4 TABLETS (80 MG TOTAL) EVERY MORNING AND 3 TABLETS (60 MG TOTAL) EVERY EVENING.) 630 tablet 1 08/24/2020 at Unknown time  . warfarin (COUMADIN) 2 MG tablet TAKE 1/2-1 TABLET BY MOUTH EVERY DAY OR AS DIRECTED BY COUMADIN CLINIC (Patient taking differently: Take 1-2 mg by mouth See admin instructions. Take 2 mg every Tue, Thu, Sat; 1 mg all other days.) 75 tablet 1 08/22/2020 at 6pm  . zinc sulfate 220 (50 Zn) MG capsule Take 220 mg by mouth daily.   08/24/2020 at Unknown time  . ACCU-CHEK SOFTCLIX LANCETS lancets USE AS DIRECTED TO test blood sugar 100 each 6   . glucose blood (ACCU-CHEK AVIVA PLUS) test strip 1 each by  Other route as needed for other. Use as needed for symptoms of low or high blood sugars 100 strip 12   . Menthol-Methyl Salicylate (TIGER BALM LINIMENT EX) Apply 1 application topically daily.       Assessment: 69 YOF on warfarin at home  for h/o mechanical mitral valve presented to the ED on 12/29 with cerebellar vermis hemorrhage. Warfarin was reversed with 45F-PCC and vitamin K and anticoagulation has been on hold since. Pharmacy consulted to resuming IV heparin per stroke protocol.  Her heparin level continues to be therapeutic on the lower goal range. Plan is to resume coumadin after 2 wks from hemorrhage. Hgb remains stable.   Goal of Therapy:  Heparin level 0.3-0.5 units/ml Monitor platelets by anticoagulation protocol: Yes   Plan:  -Continue IV heparin at 700 units/h -Daily heparin level   Onnie Boer, PharmD, [place], AAHIVP, CPP Infectious Disease Pharmacist 09/01/2020 8:17 AM

## 2020-09-01 NOTE — Care Management Important Message (Signed)
Important Message  Patient Details  Name: Deborah [name] MRN: [id number] Date of Birth: 1950-12-09   Medicare Important Message Given:  Yes     Khaleel Beckom Stefan Church 09/01/2020, 3:12 PM

## 2020-09-01 NOTE — Progress Notes (Addendum)
Advanced Heart Failure Rounding Note  PCP-Cardiologist: No primary care provider on file.   Subjective:    Had repeat head CT yesterday. Stable. No interval change.   Now out of ICU.   Remains on heparin gtt. No Coumadin yet. INR pending. Hgb stable at 13.   Awaiting CIR.   Only complaint today is sharp left lateral chest pain. No dyspnea.   Objective:   Weight Range: 67 kg Body mass index is 29.83 kg/m.   Vital Signs:   Temp:  [97.7 F (36.5 C)-98 F (36.7 C)] 98 F (36.7 C) (01/06 0323) Pulse Rate:  [42-154] 66 (01/06 0323) Resp:  [11-25] 18 (01/06 0323) BP: (106-141)/(78-96) 124/93 (01/06 0323) SpO2:  [92 %-98 %] 97 % (01/06 0323) Weight:  [67 kg] 67 kg (01/05 2007) Last BM Date: 08/30/20  Weight change: Filed Weights   08/24/20 1955 08/25/20 0305 08/31/20 2007  Weight: 67.8 kg 69.2 kg 67 kg    Intake/Output:   Intake/Output Summary (Last 24 hours) at 09/01/2020 0833 Last data filed at 09/01/2020 6073 Gross per 24 hour  Intake 638.05 ml  Output 2875 ml  Net -2236.95 ml      Physical Exam   PHYSICAL EXAM: General:  Well appearing. No respiratory difficulty HEENT: normal Neck: supple. no JVD. Carotids 2+ bilat; no bruits. No lymphadenopathy or thyromegaly appreciated. Cor: PMI nondisplaced. Regular rate & rhythm. No rubs, gallops+ mechanical valve sounds Lungs: clear Abdomen: soft, nontender, nondistended. No hepatosplenomegaly. No bruits or masses. Good bowel sounds. Extremities: no cyanosis, clubbing, rash, edema +  Neuro: alert & oriented x 3, cranial nerves grossly intact. moves all 4 extremities w/o difficulty. Affect pleasant.   Telemetry   Afib 80s  EKG    No new EKG to review   Labs    CBC Recent Labs    08/30/20 0405 09/01/20 0239  WBC 8.5 9.1  HGB 13.3 13.6  HCT 40.2 43.1  MCV 74.0* 73.4*  PLT 203 192   Basic Metabolic Panel Recent Labs    71/06/26 1049 09/01/20 0239  NA 134* 134*  K 3.6 4.0  CL 99 96*  CO2 23 24   GLUCOSE 217* 179*  BUN 38* 42*  CREATININE 1.34* 1.40*  CALCIUM 10.4* 11.0*   Liver Function Tests No results for input(s): AST, ALT, ALKPHOS, BILITOT, PROT, ALBUMIN in the last 72 hours. No results for input(s): LIPASE, AMYLASE in the last 72 hours. Cardiac Enzymes No results for input(s): CKTOTAL, CKMB, CKMBINDEX, TROPONINI in the last 72 hours.  BNP: BNP (last 3 results) No results for input(s): BNP in the last 8760 hours.  ProBNP (last 3 results) No results for input(s): PROBNP in the last 8760 hours.   D-Dimer No results for input(s): DDIMER in the last 72 hours. Hemoglobin A1C No results for input(s): HGBA1C in the last 72 hours. Fasting Lipid Panel No results for input(s): CHOL, HDL, LDLCALC, TRIG, CHOLHDL, LDLDIRECT in the last 72 hours. Thyroid Function Tests No results for input(s): TSH, T4TOTAL, T3FREE, THYROIDAB in the last 72 hours.  Invalid input(s): FREET3  Other results:   Imaging    No results found.   Medications:     Scheduled Medications: . Chlorhexidine Gluconate Cloth  6 each Topical Daily  . divalproex  500 mg Oral Daily  . insulin aspart  0-15 Units Subcutaneous TID WC  . isosorbide mononitrate  30 mg Oral Daily  . metoprolol succinate  50 mg Oral Daily  . pantoprazole  40 mg  Oral QHS  . potassium chloride  40 mEq Oral BID  . rosuvastatin  10 mg Oral Daily  . senna-docusate  1 tablet Oral BID  . torsemide  80 mg Oral q morning - 10a   And  . torsemide  60 mg Oral QHS    Infusions: . clevidipine Stopped (08/25/20 1312)  . heparin 700 Units/hr (09/01/20 0810)    PRN Medications: acetaminophen **OR** acetaminophen (TYLENOL) oral liquid 160 mg/5 mL **OR** acetaminophen, benzonatate, labetalol, meclizine, ondansetron (ZOFRAN) IV    Assessment/Plan    1. 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  - heparin gtt started 1/3 per neurology. No coumadin yet. Per neuro, Plan restart coumadin after 2 weeks of  hemorrhage and not continue aspirin long-term. - INR 1.0 yesterday.   - Long term, will have her stop ASA.  When warfarin restarted, will aim for INR 2.5-3 for the time being.  - needs CIR for rehab 2. Atrial fibrillation: Chronic. Rate controlled.   - Continue Toprol XL 50 mg daily.  - Continue heparin gtt.  3. Mechanical mitral valve: Had been on ASA 81 daily and coumadin with INR goal 2.5-3.5. Needs endocarditis prophylaxis with any dental work. Mechanical MV looked ok on echo this admission (elevated mean gradient but PHT not prolonged).  - Continue heparin gtt  - Long term, will have her stop ASA.  When warfarin restarted, will aim for INR 2.5-3 for the time being.  4. Tricuspid valve repair: Stable by echo in12/21,TR appears mild-moderate with no significant stenosis. 5. Chronic diastolic TOI:ZTIW this admission with normal EF, mildly decreased RV systolic function.  Volume status stable.    -Continue home torsemide 80 qam/60 qpm and KCl.  6. CAD: coronary CTA in 8/20 showed nonobstructive CAD, suspect chest pain was noncardiac.She still has episodes of atypical chest pain.No ischemic chest pain today.  - Continue Crestor 10 mg daily.  Length of Stay: [address], [place]  09/01/2020, 8:33 AM  Advanced Heart Failure Team Pager [phone] (M-F; 7a - 4p)  Please contact [facility] for night-coverage after hours (4p -7a ) and weekends on [email or web address]  Patient seen with PA, agree with the above note.   She is stable today, still with mild headache.  Volume status looks ok on her home diuretic regimen and creatinine is stable.  She will need to continue heparin gtt until 2 wks post-ICH, then can restart warfarin at that time.  She will be going to CIR.   Marca Ancona 09/01/2020 1:27 PM

## 2020-09-01 NOTE — Progress Notes (Signed)
Inpatient Rehabilitation-Admissions Coordinator   Notified pt and her daughter that we do not have an open bed for her today. Will follow up tomorrow for possible admit, pending bed availability.   Cheri Rous, OTR/L  Rehab Admissions Coordinator  [phone] 09/01/2020 4:40 PM

## 2020-09-01 NOTE — Progress Notes (Signed)
Physical Therapy Treatment Patient Details Name: Deborah [name] MRN: [id number] DOB: 02-14-1951 Today's Date: 09/01/2020    History of Present Illness The pt is a 70 yo female presenting with severe headache, nausea, and vomiting. 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 PMH includes: chronic afib, gout, HLD, HTN, obesity,    PT Comments    Pt remains limited by dizziness with mobility. PT provides further education on gaze stabilization with mobility however the pt has difficulty separating eye and head movement. Pt does show improvement in transfer quality with use of a RW and requires less assistance for bed mobility. Pt will continue to benefit from acute PT POC to reduce falls risk and to reduce dizziness during mobility. PT continues to recommend CIR placement at this time.   Follow Up Recommendations  CIR     Equipment Recommendations  Rolling walker with 5" wheels    Recommendations for Other Services       Precautions / Restrictions Precautions Precautions: Fall Precaution Comments: dizziness with mobility, used gaze stabilization Restrictions Weight Bearing Restrictions: No    Mobility  Bed Mobility Overal bed mobility: Needs Assistance Bed Mobility: Rolling;Sidelying to Sit;Sit to Sidelying Rolling: Min assist Sidelying to sit: Mod assist     Sit to sidelying: Min assist General bed mobility comments: PT attempts to provide instruction on gaze stabilization during mobility, pt with some difficulty following  commnads for eye movement followed by head movement  Transfers Overall transfer level: Needs assistance Equipment used: Rolling walker (2 wheeled) Transfers: Sit to/from [employer] Sit to Stand: Mod assist;Min assist (modA initially progressing to minA) Stand pivot transfers: Min assist          Ambulation/Gait Ambulation/Gait assistance: Min assist Gait Distance (Feet): 9 Feet Assistive  device: Rolling walker (2 wheeled) Gait Pattern/deviations: Step-to pattern Gait velocity: reduced Gait velocity interpretation: <1.31 ft/sec, indicative of household ambulator General Gait Details: pt with short step length and slowed gait speed, limited by dizziness during session   Stairs             Wheelchair Mobility    Modified Rankin (Stroke Patients Only) Modified Rankin (Stroke Patients Only) Pre-Morbid Rankin Score: No symptoms Modified Rankin: Moderately severe disability     Balance Overall balance assessment: Needs assistance Sitting-balance support: Single extremity supported;Bilateral upper extremity supported;Feet supported Sitting balance-Leahy Scale: Poor Sitting balance - Comments: reliant on UE support of bed   Standing balance support: Bilateral upper extremity supported Standing balance-Leahy Scale: Poor Standing balance comment: reliant on UE support of RW                            Cognition Arousal/Alertness: Awake/alert Behavior During Therapy: WFL for tasks assessed/performed Overall Cognitive Status: Within Functional Limits for tasks assessed                                        Exercises      General Comments General comments (skin integrity, edema, etc.): Pt tachy during session, especially with reports of nausea and dizziness. Pt HR elevated up to 140s when mobilizing and dizzy, back in high 90s and low 100s at end of session when resting in supine      Pertinent Vitals/Pain Pain Assessment: No/denies pain    Home Living  Prior Function            PT Goals (current goals can now be found in the care plan section) Acute Rehab PT Goals Patient Stated Goal: to tolerate mobility better and reduce dizziness Progress towards PT goals: Progressing toward goals    Frequency    Min 4X/week      PT Plan Current plan remains appropriate    Co-evaluation               AM-PAC PT "6 Clicks" Mobility   Outcome Measure  Help needed turning from your back to your side while in a flat bed without using bedrails?: A Little Help needed moving from lying on your back to sitting on the side of a flat bed without using bedrails?: A Lot Help needed moving to and from a bed to a chair (including a wheelchair)?: A Little Help needed standing up from a chair using your arms (e.g., wheelchair or bedside chair)?: A Little Help needed to walk in hospital room?: A Little Help needed climbing 3-5 steps with a railing? : Total 6 Click Score: 15    End of Session Equipment Utilized During Treatment: Gait belt Activity Tolerance: Treatment limited secondary to medical complications (Comment) (limited by dizziness) Patient left: in bed;with call bell/phone within reach;with bed alarm set;with family/visitor present Nurse Communication: Mobility status PT Visit Diagnosis: Other symptoms and signs involving the nervous system (P59.163)     Time: 8466-5993 PT Time Calculation (min) (ACUTE ONLY): 28 min  Charges:  $Gait Training: 8-22 mins $Therapeutic Activity: 8-22 mins                     Arlyss Gandy, PT, DPT Acute Rehabilitation Pager: [phone]    Arlyss Gandy 09/01/2020, 3:20 PM

## 2020-09-02 ENCOUNTER — Other Ambulatory Visit: Payer: Self-pay

## 2020-09-02 ENCOUNTER — Inpatient Hospital Stay (HOSPITAL_COMMUNITY)
Admission: RE | Admit: 2020-09-02 | Discharge: 2020-09-27 | DRG: 056 | Disposition: E | Payer: Medicare Other | Source: Intra-hospital | Attending: Physical Medicine & Rehabilitation | Admitting: Physical Medicine & Rehabilitation

## 2020-09-02 DIAGNOSIS — D6959 Other secondary thrombocytopenia: Secondary | ICD-10-CM | POA: Diagnosis not present

## 2020-09-02 DIAGNOSIS — E669 Obesity, unspecified: Secondary | ICD-10-CM | POA: Diagnosis present

## 2020-09-02 DIAGNOSIS — R791 Abnormal coagulation profile: Secondary | ICD-10-CM | POA: Diagnosis present

## 2020-09-02 DIAGNOSIS — I110 Hypertensive heart disease with heart failure: Secondary | ICD-10-CM | POA: Diagnosis present

## 2020-09-02 DIAGNOSIS — Z7982 Long term (current) use of aspirin: Secondary | ICD-10-CM | POA: Diagnosis not present

## 2020-09-02 DIAGNOSIS — D696 Thrombocytopenia, unspecified: Secondary | ICD-10-CM

## 2020-09-02 DIAGNOSIS — I10 Essential (primary) hypertension: Secondary | ICD-10-CM

## 2020-09-02 DIAGNOSIS — E871 Hypo-osmolality and hyponatremia: Secondary | ICD-10-CM | POA: Diagnosis not present

## 2020-09-02 DIAGNOSIS — E785 Hyperlipidemia, unspecified: Secondary | ICD-10-CM | POA: Diagnosis present

## 2020-09-02 DIAGNOSIS — I69193 Ataxia following nontraumatic intracerebral hemorrhage: Secondary | ICD-10-CM | POA: Diagnosis present

## 2020-09-02 DIAGNOSIS — K5900 Constipation, unspecified: Secondary | ICD-10-CM | POA: Diagnosis present

## 2020-09-02 DIAGNOSIS — I619 Nontraumatic intracerebral hemorrhage, unspecified: Secondary | ICD-10-CM | POA: Diagnosis not present

## 2020-09-02 DIAGNOSIS — Z79899 Other long term (current) drug therapy: Secondary | ICD-10-CM | POA: Diagnosis not present

## 2020-09-02 DIAGNOSIS — I469 Cardiac arrest, cause unspecified: Secondary | ICD-10-CM | POA: Diagnosis not present

## 2020-09-02 DIAGNOSIS — Z7901 Long term (current) use of anticoagulants: Secondary | ICD-10-CM | POA: Diagnosis not present

## 2020-09-02 DIAGNOSIS — Z823 Family history of stroke: Secondary | ICD-10-CM

## 2020-09-02 DIAGNOSIS — E1122 Type 2 diabetes mellitus with diabetic chronic kidney disease: Secondary | ICD-10-CM | POA: Diagnosis present

## 2020-09-02 DIAGNOSIS — I2129 ST elevation (STEMI) myocardial infarction involving other sites: Secondary | ICD-10-CM | POA: Diagnosis not present

## 2020-09-02 DIAGNOSIS — Z952 Presence of prosthetic heart valve: Secondary | ICD-10-CM | POA: Diagnosis not present

## 2020-09-02 DIAGNOSIS — N1832 Chronic kidney disease, stage 3b: Secondary | ICD-10-CM | POA: Diagnosis present

## 2020-09-02 DIAGNOSIS — I130 Hypertensive heart and chronic kidney disease with heart failure and stage 1 through stage 4 chronic kidney disease, or unspecified chronic kidney disease: Secondary | ICD-10-CM | POA: Diagnosis present

## 2020-09-02 DIAGNOSIS — K219 Gastro-esophageal reflux disease without esophagitis: Secondary | ICD-10-CM | POA: Diagnosis present

## 2020-09-02 DIAGNOSIS — E1165 Type 2 diabetes mellitus with hyperglycemia: Secondary | ICD-10-CM

## 2020-09-02 DIAGNOSIS — I639 Cerebral infarction, unspecified: Secondary | ICD-10-CM

## 2020-09-02 DIAGNOSIS — E1169 Type 2 diabetes mellitus with other specified complication: Secondary | ICD-10-CM

## 2020-09-02 DIAGNOSIS — Z7984 Long term (current) use of oral hypoglycemic drugs: Secondary | ICD-10-CM | POA: Diagnosis not present

## 2020-09-02 DIAGNOSIS — N179 Acute kidney failure, unspecified: Secondary | ICD-10-CM | POA: Diagnosis not present

## 2020-09-02 DIAGNOSIS — I482 Chronic atrial fibrillation, unspecified: Secondary | ICD-10-CM | POA: Diagnosis present

## 2020-09-02 DIAGNOSIS — G479 Sleep disorder, unspecified: Secondary | ICD-10-CM

## 2020-09-02 DIAGNOSIS — Z6830 Body mass index (BMI) 30.0-30.9, adult: Secondary | ICD-10-CM | POA: Diagnosis not present

## 2020-09-02 DIAGNOSIS — I5032 Chronic diastolic (congestive) heart failure: Secondary | ICD-10-CM | POA: Diagnosis present

## 2020-09-02 DIAGNOSIS — I462 Cardiac arrest due to underlying cardiac condition: Secondary | ICD-10-CM | POA: Diagnosis not present

## 2020-09-02 DIAGNOSIS — R0789 Other chest pain: Secondary | ICD-10-CM

## 2020-09-02 DIAGNOSIS — I4891 Unspecified atrial fibrillation: Secondary | ICD-10-CM

## 2020-09-02 LAB — BASIC METABOLIC PANEL
Anion gap: 12 (ref 5–15)
BUN: 44 mg/dL — ABNORMAL HIGH (ref 8–23)
CO2: 23 mmol/L (ref 22–32)
Calcium: 10.8 mg/dL — ABNORMAL HIGH (ref 8.9–10.3)
Chloride: 99 mmol/L (ref 98–111)
Creatinine, Ser: 1.67 mg/dL — ABNORMAL HIGH (ref 0.44–1.00)
GFR, Estimated: 33 mL/min — ABNORMAL LOW (ref >60)
Glucose, Bld: 193 mg/dL — ABNORMAL HIGH (ref 70–99)
Potassium: 4.8 mmol/L (ref 3.5–5.1)
Sodium: 134 mmol/L — ABNORMAL LOW (ref 135–145)

## 2020-09-02 LAB — GLUCOSE, CAPILLARY
Glucose-Capillary: 213 mg/dL — ABNORMAL HIGH (ref 70–99)
Glucose-Capillary: 197 mg/dL — ABNORMAL HIGH (ref 70–99)
Glucose-Capillary: 199 mg/dL — ABNORMAL HIGH (ref 70–99)
Glucose-Capillary: 231 mg/dL — ABNORMAL HIGH (ref 70–99)

## 2020-09-02 LAB — HEPARIN LEVEL (UNFRACTIONATED): Heparin Unfractionated: 0.3 IU/mL (ref 0.30–0.70)

## 2020-09-02 MED ORDER — ACETAMINOPHEN 160 MG/5ML PO SOLN: 650.0000 mg | ORAL | Status: DC | PRN

## 2020-09-02 MED ORDER — SODIUM CHLORIDE 0.9 % IV BOLUS
500.0000 mL | Freq: Once | INTRAVENOUS | Status: AC
  Administered 2020-09-02: 500 mL via INTRAVENOUS

## 2020-09-02 MED ORDER — SODIUM CHLORIDE 0.9 % IV SOLN: INTRAVENOUS | Status: DC

## 2020-09-02 MED ORDER — BENZONATATE 100 MG PO CAPS: 100.0000 mg | ORAL_CAPSULE | Freq: Three times a day (TID) | ORAL | Status: DC | PRN

## 2020-09-02 MED ORDER — DIVALPROEX SODIUM ER 500 MG PO TB24: 500.0000 mg | ORAL_TABLET | Freq: Every day | ORAL | Status: DC

## 2020-09-02 MED ORDER — PANTOPRAZOLE SODIUM 40 MG PO TBEC
40.0000 mg | DELAYED_RELEASE_TABLET | Freq: Every day | ORAL | Status: DC
  Administered 2020-09-02 – 2020-09-04 (×3): 40 mg via ORAL
  Filled 2020-09-02 (×3): qty 1

## 2020-09-02 MED ORDER — MECLIZINE HCL 25 MG PO TABS
25.0000 mg | ORAL_TABLET | Freq: Three times a day (TID) | ORAL | Status: DC | PRN
  Administered 2020-09-02 – 2020-09-06 (×7): 25 mg via ORAL
  Filled 2020-09-02 (×7): qty 1

## 2020-09-02 MED ORDER — METOPROLOL SUCCINATE ER 50 MG PO TB24
50.0000 mg | ORAL_TABLET | Freq: Every day | ORAL | Status: DC
  Administered 2020-09-03 – 2020-09-06 (×4): 50 mg via ORAL
  Filled 2020-09-02 (×4): qty 1

## 2020-09-02 MED ORDER — TORSEMIDE 20 MG PO TABS: 80.0000 mg | ORAL_TABLET | Freq: Every morning | ORAL | Status: DC

## 2020-09-02 MED ORDER — BLOOD PRESSURE CONTROL BOOK
Freq: Once | Status: AC
  Filled 2020-09-02: qty 1

## 2020-09-02 MED ORDER — SENNOSIDES-DOCUSATE SODIUM 8.6-50 MG PO TABS
1.0000 | ORAL_TABLET | Freq: Two times a day (BID) | ORAL | Status: DC
  Administered 2020-09-02 – 2020-09-06 (×8): 1 via ORAL
  Filled 2020-09-02 (×8): qty 1

## 2020-09-02 MED ORDER — TORSEMIDE 20 MG PO TABS
80.0000 mg | ORAL_TABLET | Freq: Every morning | ORAL | Status: DC
  Administered 2020-09-03 – 2020-09-06 (×4): 80 mg via ORAL
  Filled 2020-09-02 (×4): qty 4

## 2020-09-02 MED ORDER — ISOSORBIDE MONONITRATE ER 30 MG PO TB24
30.0000 mg | ORAL_TABLET | Freq: Every day | ORAL | Status: DC
  Administered 2020-09-03 – 2020-09-06 (×4): 30 mg via ORAL
  Filled 2020-09-02 (×4): qty 1

## 2020-09-02 MED ORDER — TORSEMIDE 20 MG PO TABS
60.0000 mg | ORAL_TABLET | Freq: Every day | ORAL | Status: DC
  Administered 2020-09-02 – 2020-09-05 (×4): 60 mg via ORAL
  Filled 2020-09-02 (×4): qty 3

## 2020-09-02 MED ORDER — ACETAMINOPHEN 325 MG PO TABS
650.0000 mg | ORAL_TABLET | ORAL | Status: DC | PRN
Start: 2020-09-02 — End: 2020-09-07
  Filled 2020-09-02: qty 2

## 2020-09-02 MED ORDER — HEPARIN (PORCINE) 25000 UT/250ML-% IV SOLN
650.0000 [IU]/h | INTRAVENOUS | Status: DC
  Administered 2020-09-02 – 2020-09-05 (×3): 750 [IU]/h via INTRAVENOUS
  Filled 2020-09-02 (×3): qty 250

## 2020-09-02 MED ORDER — TORSEMIDE 20 MG PO TABS: 60.0000 mg | ORAL_TABLET | Freq: Every day | ORAL | Status: DC

## 2020-09-02 MED ORDER — EXERCISE FOR HEART AND HEALTH BOOK
Freq: Once | Status: AC
  Filled 2020-09-02: qty 1

## 2020-09-02 MED ORDER — ROSUVASTATIN CALCIUM 5 MG PO TABS
10.0000 mg | ORAL_TABLET | Freq: Every day | ORAL | Status: DC
  Administered 2020-09-03 – 2020-09-06 (×4): 10 mg via ORAL
  Filled 2020-09-02 (×4): qty 2

## 2020-09-02 MED ORDER — POTASSIUM CHLORIDE CRYS ER 20 MEQ PO TBCR
40.0000 meq | EXTENDED_RELEASE_TABLET | Freq: Two times a day (BID) | ORAL | Status: DC
  Administered 2020-09-02 – 2020-09-06 (×8): 40 meq via ORAL
  Filled 2020-09-02 (×8): qty 2

## 2020-09-02 MED ORDER — LIVING WELL WITH DIABETES BOOK
Freq: Once | Status: AC
  Filled 2020-09-02: qty 1

## 2020-09-02 MED ORDER — ACETAMINOPHEN 650 MG RE SUPP: 650.0000 mg | RECTAL | Status: DC | PRN

## 2020-09-02 MED ORDER — INSULIN ASPART 100 UNIT/ML ~~LOC~~ SOLN
0.0000 [IU] | Freq: Three times a day (TID) | SUBCUTANEOUS | Status: DC
  Administered 2020-09-02 – 2020-09-03 (×2): 3 [IU] via SUBCUTANEOUS
  Administered 2020-09-03 – 2020-09-04 (×2): 5 [IU] via SUBCUTANEOUS
  Administered 2020-09-04: 3 [IU] via SUBCUTANEOUS
  Administered 2020-09-04: 2 [IU] via SUBCUTANEOUS
  Administered 2020-09-05: 3 [IU] via SUBCUTANEOUS
  Administered 2020-09-05: 5 [IU] via SUBCUTANEOUS
  Administered 2020-09-05: 2 [IU] via SUBCUTANEOUS
  Administered 2020-09-06 (×2): 3 [IU] via SUBCUTANEOUS

## 2020-09-02 MED ORDER — ONDANSETRON 4 MG PO TBDP
4.0000 mg | ORAL_TABLET | Freq: Three times a day (TID) | ORAL | Status: DC | PRN
  Administered 2020-09-02 – 2020-09-06 (×7): 4 mg via ORAL
  Filled 2020-09-02 (×7): qty 1

## 2020-09-02 NOTE — Progress Notes (Signed)
PMR Admission Coordinator Pre-Admission Assessment  Patient: Deborah [name] is an 69 y.o., female MRN: [id number] DOB: 11/03/1950 Height: 4' 11" (149.9 cm) Weight: 67 kg              HMO:     PPO:      PCP:      IPA:      80/20: yes      OTHER:  PRIMARY: Medicare Part A and B       Policy#: [id number]      Subscriber: Pt.  CM Name:       Phone#:      Fax#:  Pre-Cert#:  verified online      Employer:  Benefits:  Phone #:      Name:  Eff. Date: 09/27/2017  A and B     Deduct: $1566 Out of Pocket Max: n/a      Life Max: n/a CIR: 100%      SNF: 20 full days Outpatient: 80%     Co-Pay: 20% Home Health: 100%      Co-Pay:  DME: 80%     Co-Pay: 20% Providers: pt choice   SECONDARY: Medicaid [place] Access     Policy#: [id number]      Financial Counselor:       Phone#:   The "Data Collection Information Summary" for patients in Inpatient Rehabilitation Facilities with attached "Privacy Act Statement-Health Care Records" was provided and verbally reviewed with: Patient  Emergency Contact Information Contact Information     Name Relation Home Work Mobile   Hole,Naitina Daughter [phone]  [phone]   Hotlieng,Sal Spouse [phone]     Hole,Daniel Son [phone]  [phone]      Current Medical History  Patient Admitting Diagnosis: IPH History of Present Illness: Deborah [name] is a 69-year-old right-handed female with history of borderline diabetes mellitus, chronic atrial fibrillation rheumatic heart disease status post mitral valve replacement tricuspid replacement maintained on chronic Coumadin, hypertension, hyperlipidemia, obesity with BMI 30.81. Per chart review patient lives with her family independent prior to admission. 1 level home 3 steps to entry independent and active. She does not drive. Presented 08/24/2020 with headache nausea vomiting decreased mobility x2 days. 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 No downward herniation. CT abdomen pelvis showed large amount of stool no other abnormality identified. Chest x-ray showed marked cardiac enlargement with pulmonary vascular congestion. Admission chemistries hemoglobin 11 blood cultures no growth to date lactic acid 2.9 INR 2.1 troponin negative. 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 Cardiology services continues to follow with cardiac history. Follow-up cranial CT scan 08/31/2020 showed no significant interval change in size of intraparenchymal hemorrhage as compared to prior tracings. Maintained on Depakote for headaches. Blood pressure monitored patient did require Cleviprex. Echocardiogram with ejection fraction of 55 to 60% no wall motion abnormalities. Currently on a regular diet. Due to patient's decreased functional ability therapy evaluations completed and patient was admitted for a comprehensive rehab program.   Complete NIHSS TOTAL: 1 Glasgow Coma Scale Score: 15  Past Medical History  Past Medical History:  Diagnosis Date  . Allergic rhinitis   . Anemia   . Borderline diabetes   . Carpal tunnel syndrome   . Chronic atrial fibrillation (HCC)    coumadin managed by [facility] CVRR;  Event montor (7/12-8/12) showed no   high rate episodes (patient continuously in atrial fibrillation).   . Chronic diastolic heart failure (HCC)   . Eosinophilia 01/27/2013  . External hemorrhoid   . External hemorrhoids   . Fatigue   . Gout   . 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 Ha  . Hx of cardiovascular stress test    ETT-myoview (7/12): No evidence for ischemia or infarction  . Hyperlipidemia   . Hypertension   . Obesity   . Rheumatic heart disease    Status post mechanical (St. Jude) mitral valve replacement and  tricuspid valve repair at [facility] in 2002; echo 5/12:   EF 60-65%, mild AI, mitral valve prosthesis with AVA 1.66, severe LAE, moderate RAE, moderate to severe TR, mild increased pulmonary artery systolic pressure;    Adenosine Cardiolite in 4/09:   No ischemia, EF 66%  . Rotator cuff syndrome     Family History  family history includes Diabetes in her brother and mother; Gout in her brother; Heart disease in her brother, father, and sister; Heart failure in her mother; Stroke in her brother and mother; Valvular heart disease in her sister.  Prior Rehab/Hospitalizations:  Has the patient had prior rehab or hospitalizations prior to admission? No  Has the patient had major surgery during 100 days prior to admission? No  Current Medications   Current Facility-Administered Medications:  .  acetaminophen (TYLENOL) tablet 650 mg, 650 mg, Oral, Q4H PRN **OR** acetaminophen (TYLENOL) 160 MG/5ML solution 650 mg, 650 mg, Per Tube, Q4H PRN **OR** acetaminophen (TYLENOL) suppository 650 mg, 650 mg, Rectal, Q4H PRN, Arora, Ashish, MD .  benzonatate (TESSALON) capsule 100 mg, 100 mg, Oral, TID PRN, Collins, Hunter J, MD .  Chlorhexidine Gluconate Cloth 2 % PADS 6 each, 6 each, Topical, Daily, Arora, Ashish, MD, 6 each at 09/01/20 1036 .  divalproex (DEPAKOTE ER) 24 hr tablet 500 mg, 500 mg, Oral, Daily, Sethi, Pramod S, MD .  heparin ADULT infusion 100 units/mL (25000 units/250mL), 700 Units/hr, Intravenous, Continuous, Bitonti, Michael T, RPH, Last Rate: 7 mL/hr at 09/01/20 0810, 700 Units/hr at 09/01/20 0810 .  insulin aspart (novoLOG) injection 0-15 Units, 0-15 Units, Subcutaneous, TID WC, Sethi, Pramod S, MD, 5 Units at 09/16/2020 0748 .  isosorbide mononitrate (IMDUR) 24 hr tablet 30 mg, 30 mg, Oral, Daily, Xu, Jindong, MD, 30 mg at 09/01/20 1034 .  labetalol (NORMODYNE) injection 5-20 mg, 5-20 mg, Intravenous, Q10 min PRN, Xu, Jindong, MD, 20 mg at 08/27/20 2013 .  meclizine (ANTIVERT) tablet 25 mg, 25  mg, Oral, TID PRN, Xu, Jindong, MD, 25 mg at 09/14/2020 0749 .  metoprolol succinate (TOPROL-XL) 24 hr tablet 50 mg, 50 mg, Oral, Daily, Xu, Jindong, MD, 50 mg at 09/01/20 1034 .  ondansetron (ZOFRAN) injection 4 mg, 4 mg, Intravenous, Q6H PRN, Xu, Jindong, MD, 4 mg at 09/01/20 1310 .  pantoprazole (PROTONIX) EC tablet 40 mg, 40 mg, Oral, QHS, Sethi, Pramod S, MD, 40 mg at 09/01/20 2154 .  potassium chloride SA (KLOR-CON) CR tablet 40 mEq, 40 mEq, Oral, BID, Collins, Hunter J, MD, 40 mEq at 09/01/20 2154 .  rosuvastatin (CRESTOR) tablet 10 mg, 10 mg, Oral, Daily, McLean, Dalton S, MD, 10 mg at 09/01/20 1034 .  senna-docusate (Senokot-S) tablet 1 tablet, 1 tablet, Oral, BID, Arora, Ashish, MD, 1 tablet at 09/01/20 2154 .  torsemide (DEMADEX) tablet 80 mg, 80 mg, Oral, q morning - 10a, 80 mg at 09/01/20 1034 **AND**   torsemide (DEMADEX) tablet 60 mg, 60 mg, Oral, QHS, Xu, Jindong, MD, 60 mg at 09/01/20 2153  Patients Current Diet:  Diet Order             Diet Carb Modified Fluid consistency: Thin; Room service appropriate? Yes  Diet effective now                   Precautions / Restrictions Precautions Precautions: Fall Precaution Comments: dizziness with mobility, used gaze stabilization Restrictions Weight Bearing Restrictions: No   Has the patient had 2 or more falls or a fall with injury in the past year?No  Prior Activity Level Limited Community (1-2x/wk): Went out for appointments  Prior Functional Level Prior Function Level of Independence: Independent Comments: ADLs and IADLs. Enjoys walking for exercises. Does not drive  Self Care: Did the patient need help bathing, dressing, using the toilet or eating?  Independent  Indoor Mobility: Did the patient need assistance with walking from room to room (with or without device)? Independent  Stairs: Did the patient need assistance with internal or external stairs (with or without device)? Independent  Functional Cognition:  Did the patient need help planning regular tasks such as shopping or remembering to take medications? Independent  Home Assistive Devices / Equipment Home Assistive Devices/Equipment: None Home Equipment: None  Prior Device Use: Indicate devices/aids used by the patient prior to current illness, exacerbation or injury? None of the above  Current Functional Level Cognition  Arousal/Alertness:  (awake) Overall Cognitive Status: Within Functional Limits for tasks assessed Orientation Level: Oriented X4 General Comments: pt able to follow commands consistently Attention: Sustained Sustained Attention: Appears intact Memory: Appears intact Awareness: Appears intact Problem Solving: Appears intact Safety/Judgment: Appears intact    Extremity Assessment (includes Sensation/Coordination)  Upper Extremity Assessment: Generalized weakness LUE Deficits / Details: decr grasp 3 out 5/ increased time to release on command  Lower Extremity Assessment: Defer to PT evaluation    ADLs  Overall ADL's : Needs assistance/impaired Eating/Feeding: Moderate assistance,Bed level Eating/Feeding Details (indicate cue type and reason): daughter feeding patient. pt states "ate alot" Grooming: Wash/dry face,Modified independent,Bed level Grooming Details (indicate cue type and reason): blowing nose in bed on arrival . pt noted to remove large clot from nasal passage Upper Body Bathing: Maximal assistance Lower Body Bathing: Total assistance Toilet Transfer: +2 for physical assistance,Moderate assistance Functional mobility during ADLs: +2 for physical assistance,Moderate assistance General ADL Comments: session focused on basic transfer to help with 3n1 transfers and OOB to chair for meals. pt has prolonged bed level care for this stay and needs to progress oob    Mobility  Overal bed mobility: Needs Assistance Bed Mobility: Rolling,Sidelying to Sit,Sit to Sidelying Rolling: Min assist Sidelying to sit:  Mod assist Supine to sit: Mod assist,+2 for physical assistance Sit to supine: Mod assist,+2 for physical assistance Sit to sidelying: Min assist General bed mobility comments: PT attempts to provide instruction on gaze stabilization during mobility, pt with some difficulty following  commnads for eye movement followed by head movement    Transfers  Overall transfer level: Needs assistance Equipment used: Rolling walker (2 wheeled) Transfers: Sit to/from Stand,Stand Pivot Transfers Sit to Stand: Mod assist,Min assist (modA initially progressing to minA) Stand pivot transfers: Min assist General transfer comment: modA to power up due to pt fear of dizziness and nausa, used gaze stabilization    Ambulation / Gait / Stairs / Wheelchair Mobility  Ambulation/Gait Ambulation/Gait assistance: Min assist Gait Distance (Feet): 9 Feet Assistive   device: Rolling walker (2 wheeled) Gait Pattern/deviations: Step-to pattern General Gait Details: pt with short step length and slowed gait speed, limited by dizziness during session Gait velocity: reduced Gait velocity interpretation: <1.31 ft/sec, indicative of household ambulator    Posture / Balance Dynamic Sitting Balance Sitting balance - Comments: reliant on UE support of bed Balance Overall balance assessment: Needs assistance Sitting-balance support: Single extremity supported,Bilateral upper extremity supported,Feet supported Sitting balance-Leahy Scale: Poor Sitting balance - Comments: reliant on UE support of bed Standing balance support: Bilateral upper extremity supported Standing balance-Leahy Scale: Poor Standing balance comment: reliant on UE support of RW    Special needs/care consideration Diabetic management  sQ Novolog 0-15  Units 3x daily with meals, and Special service needs Heparin Drip     Previous Home Environment (from acute therapy documentation) Living Arrangements: Children,Spouse/significant other Available Help at  Discharge: Family,Available 24 hours/day Type of Home: House Home Layout: One level Home Access: Stairs to enter Entrance Stairs-Rails: None (front steps) Entrance Stairs-Number of Steps: front 3; back 9 Bathroom Shower/Tub: Tub/shower unit Bathroom Toilet: Standard Home Care Services: No  Discharge Living Setting Plans for Discharge Living Setting: Patient's home Type of Home at Discharge: House Discharge Home Layout: One level Discharge Home Access: Stairs to enter Entrance Stairs-Rails: None Entrance Stairs-Number of Steps: front 3, back 9 Discharge Bathroom Shower/Tub: Tub/shower unit Discharge Bathroom Toilet: Standard Discharge Bathroom Accessibility: Yes How Accessible: Accessible via walker Does the patient have any problems obtaining your medications?: No  Social/Family/Support Systems Patient Roles: Other (Comment) Contact Information: [phone] Anticipated Caregiver: Naitina Hole (daughter) Anticipated Caregiver's Contact Information: [phone] Ability/Limitations of Caregiver: Can provide Mod A Caregiver Availability: 24/7 Discharge Plan Discussed with Primary Caregiver: Yes Is Caregiver In Agreement with Plan?: Yes Does Caregiver/Family have Issues with Lodging/Transportation while Pt is in Rehab?: No   Goals Patient/Family Goal for Rehab: PT/SLP Supervision, OT min A/Supervision Expected length of stay: 14-18 days Pt/Family Agrees to Admission and willing to participate: Yes Program Orientation Provided & Reviewed with Pt/Caregiver Including Roles  & Responsibilities: Yes   Decrease burden of Care through IP rehab admission: Specialzed equipment needs, Decrease number of caregivers and Patient/family education   Possible need for SNF placement upon discharge: Not anticipated   Patient Condition: This patient's medical and functional status has changed since the consult dated: 08/29/2020 in which the Rehabilitation Physician determined and documented  that the patient's condition is appropriate for intensive rehabilitative care in an inpatient rehabilitation facility. See "History of Present Illness" (above) for medical update. Functional changes are: Pt. Walking short distances (9 ft) min A. Patient's medical and functional status update has been discussed with the Rehabilitation physician and patient remains appropriate for inpatient rehabilitation. Will admit to inpatient rehab today.  Preadmission Screen Completed By:  Tenea Sens B Kurtiss Wence, CCC-SLP, 09/15/2020 8:43 AM ______________________________________________________________________   Discussed status with Dr. Patel on 09/08/2020 at 10:38 and received approval for admission today.  Admission Coordinator:  Merideth Bosque B Ralston Venus, time 10:35 /Date 09/24/2020      for  SNF placement upon discharge: Not anticipated     Patient Condition: This patient's medical and functional status has changed since the consult dated: 08/29/2020 in which the Rehabilitation Physician determined and documented that the patient's condition is appropriate for intensive rehabilitative care in an inpatient rehabilitation facility. See "History of Present Illness" (above) for medical update. Functional changes are: Pt. Walking short distances (9 ft) min A. Patient's medical and functional status update has been discussed with the Rehabilitation physician and patient remains appropriate for inpatient rehabilitation. Will admit to inpatient rehab today.   Preadmission Screen Completed By:  Jeronimo Greaves, CCC-SLP, 09/12/2020 8:43 AM ______________________________________________________________________   Discussed status with Dr. Allena Katz on 09/07/2020 at 10:38 and received approval for admission today.   Admission Coordinator:  Jeronimo Greaves, time 10:35 Dorna Bloom 08/27/2020

## 2020-09-02 NOTE — Discharge Instructions (Signed)
Inpatient Rehab Discharge Instructions  Ermine Stebbins Discharge date and time: No discharge date for patient encounter.   Activities/Precautions/ Functional Status: Activity: activity as tolerated Diet: diabetic diet Wound Care: Routine skin checks Functional status:  ___ No restrictions     ___ Walk up steps independently ___ 24/7 supervision/assistance   ___ Walk up steps with assistance ___ Intermittent supervision/assistance  ___ Bathe/dress independently ___ Walk with walker     _x__ Bathe/dress with assistance ___ Walk Independently    ___ Shower independently ___ Walk with assistance    ___ Shower with assistance ___ No alcohol     ___ Return to work/school ________  Special Instructions: No driving smoking or alcohol STROKE/TIA DISCHARGE INSTRUCTIONS SMOKING Cigarette smoking nearly doubles your risk of having a stroke & is the single most alterable risk factor  If you smoke or have smoked in the last 12 months, you are advised to quit smoking for your health.  Most of the excess cardiovascular risk related to smoking disappears within a year of stopping.  Ask you doctor about anti-smoking medications  [facility] Quit Line: 1-800-QUIT NOW  Free Smoking Cessation Classes [phone]  CHOLESTEROL Know your levels; limit fat & cholesterol in your diet  Lipid Panel     Component Value Date/Time   CHOL 150 08/24/2020 2115   CHOL 182 05/21/2018 1208   TRIG 170 (H) 08/24/2020 2115   HDL 45 08/24/2020 2115   HDL 57 05/21/2018 1208   CHOLHDL 3.3 08/24/2020 2115   VLDL 34 08/24/2020 2115   LDLCALC 71 08/24/2020 2115   LDLCALC 86 05/21/2018 1208      Many patients benefit from treatment even if their cholesterol is at goal.  Goal: Total Cholesterol (CHOL) less than 160  Goal:  Triglycerides (TRIG) less than 150  Goal:  HDL greater than 40  Goal:  LDL (LDLCALC) less than 100   BLOOD PRESSURE American Stroke Association blood pressure target is less that 120/80 mm/Hg   Your discharge blood pressure is:  BP: 115/80  Monitor your blood pressure  Limit your salt and alcohol intake  Many individuals will require more than one medication for high blood pressure  DIABETES (A1c is a blood sugar average for last 3 months) Goal HGBA1c is under 7% (HBGA1c is blood sugar average for last 3 months)  Diabetes:    Lab Results  Component Value Date   HGBA1C 7.5 (H) 08/24/2020     Your HGBA1c can be lowered with medications, healthy diet, and exercise.  Check your blood sugar as directed by your physician  Call your physician if you experience unexplained or low blood sugars.  PHYSICAL ACTIVITY/REHABILITATION Goal is 30 minutes at least 4 days per week  Activity: Increase activity slowly, Therapies: Physical Therapy: Home Health Return to work:   Activity decreases your risk of heart attack and stroke and makes your heart stronger.  It helps control your weight and blood pressure; helps you relax and can improve your mood.  Participate in a regular exercise program.  Talk with your doctor about the best form of exercise for you (dancing, walking, swimming, cycling).  DIET/WEIGHT Goal is to maintain a healthy weight  Your discharge diet is:  Diet Order            Diet Carb Modified Fluid consistency: Thin; Room service appropriate? Yes  Diet effective now                 liquids Your height is:  Height: 4\' 11"  (149.9 cm) Your current weight is: Weight: 64.9 kg Your Body Mass Index (BMI) is:  BMI (Calculated): 28.88  Following the type of diet specifically designed for you will help prevent another stroke.  Your goal weight range is:    Your goal Body Mass Index (BMI) is 19-24.  Healthy food habits can help reduce 3 risk factors for stroke:  High cholesterol, hypertension, and excess weight.  RESOURCES Stroke/Support Group:  Call [phone]   STROKE EDUCATION PROVIDED/REVIEWED AND GIVEN TO PATIENT Stroke warning signs and symptoms How to  activate emergency medical system (call 911). Medications prescribed at discharge. Need for follow-up after discharge. Personal risk factors for stroke. Pneumonia vaccine given:  Flu vaccine given:  My questions have been answered, the writing is legible, and I understand these instructions.  I will adhere to these goals & educational materials that have been provided to me after my discharge from the hospital.      My questions have been answered and I understand these instructions. I will adhere to these goals and the provided educational materials after my discharge from the hospital.  Patient/Caregiver Signature _______________________________ Date __________  Clinician Signature _______________________________________ Date __________  Please bring this form and your medication list with you to all your follow-up doctor's appointments.

## 2020-09-02 NOTE — Progress Notes (Signed)
Inpatient Rehabilitation Medication Review by a Pharmacist  A complete drug regimen review was completed for this patient to identify any potential clinically significant medication issues.  Clinically significant medication issues were identified:  no  Check AMION for pharmacist assigned to patient if future medication questions/issues arise during this admission.  Pharmacist comments:   The following home meds were not resumed.  Please resume as indicated.  Allopurinol (PRN gout flare), Voltaren Gel, Folic Acid, Metformin (currently on SSI), ASA (held for bleed), Zyrtec (PRN allergies), Diazepam, Famotidine, and Zinc.  Also noted, patient's warfarin on hold given recent bleed.  Team is aware.  Pt continues on IV heparin with plans to wait at least 2 weeks before resuming oral anticoagulation.    Time spent performing this drug regimen review (minutes):  15   Deborah [name], Judie Bonus 09-03-20 4:06 PM

## 2020-09-02 NOTE — Progress Notes (Addendum)
[facility] PHYSICAL MEDICINE & REHABILITATION PROGRESS NOTE   Subjective/Complaints: Experienced some n/v/d overnight. Nothing at present. Denies pain  ROS: Patient denies fever, rash, sore throat, blurred vision,  diarrhea, cough, shortness of breath or chest pain, joint or back pain, headache, or mood change.    Objective:   No results found. Recent Labs    09/01/20 0239  WBC 9.1  HGB 13.6  HCT 43.1  PLT 192   Recent Labs    09/01/20 0239 09-08-20 0131  NA 134* 134*  K 4.0 4.8  CL 96* 99  CO2 24 23  GLUCOSE 179* 193*  BUN 42* 44*  CREATININE 1.40* 1.67*  CALCIUM 11.0* 10.8*   No intake or output data in the 24 hours ending 2020/09/08 1927      Physical Exam: Vital Signs Blood pressure 115/80, pulse 90, temperature 98.2 F (36.8 C), resp. rate 20, height 4\' 11"  (1.499 m), weight 64.9 kg, SpO2 95 %.  General: Alert and oriented x 3, No apparent distress HEENT: Head is normocephalic, atraumatic, PERRLA, EOMI, sclera anicteric, oral mucosa pink and moist, dentition intact, ext ear canals clear,  Neck: Supple without JVD or lymphadenopathy Heart: Reg rate and rhythm. No murmurs rubs or gallops Chest: CTA bilaterally without wheezes, rales, or rhonchi; no distress Abdomen: Soft, non-tender, non-distended, bowel sounds positive. Extremities: No clubbing, cyanosis, or edema. Pulses are 2+ Skin: Clean and intact without signs of breakdown Neuro: Pt is cognitively appropriate with normal insight, memory, and awareness. Cranial nerves 2-12 are intact. Sensory exam is normal. Reflexes are 2+ in all 4's. Fine motor coordination is intact. No tremors. Motor function is grossly 5/5 RUE and RLE. LUE 4/5, LLE 4/5 prox to distal. No gross limb ataxia.  Musculoskeletal: Full ROM, No pain with AROM or PROM in the neck, trunk, or extremities. Posture appropriate Psych: Pt's affect is appropriate. Pt is cooperative     Assessment/Plan: 1. Functional deficits which require 3+ hours  per day of interdisciplinary therapy in a comprehensive inpatient rehab setting.  Physiatrist is providing close team supervision and 24 hour management of active medical problems listed below.  Physiatrist and rehab team continue to assess barriers to discharge/monitor patient progress toward functional and medical goals  Care Tool:  Bathing              Bathing assist       Upper Body Dressing/Undressing Upper body dressing        Upper body assist      Lower Body Dressing/Undressing Lower body dressing            Lower body assist       Toileting Toileting    Toileting assist       Transfers Chair/bed transfer  Transfers assist           Locomotion Ambulation   Ambulation assist              Walk 10 feet activity   Assist           Walk 50 feet activity   Assist           Walk 150 feet activity   Assist           Walk 10 feet on uneven surface  activity   Assist           Wheelchair     Assist               Wheelchair 50 feet with  2 turns activity    Assist            Wheelchair 150 feet activity     Assist          Blood pressure 115/80, pulse 90, temperature 98.2 F (36.8 C), resp. rate 20, height 4\' 11"  (1.499 m), weight 64.9 kg, SpO2 95 %.  Medical Problem List and Plan: 1. 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 Observe activity tolerance today. Consider scopolamine patch if sx limiting 2.  Antithrombotics: -DVT/anticoagulation: Intravenous heparin             -antiplatelet therapy: N/A 3. Pain Management: Tylenol as needed 4. Mood: Provide emotional support             -antipsychotic  agents: N/A 5. Neuropsych: This patient is capable of making decisions on her own behalf. 6. Skin/Wound Care: Routine skin checks 7. Fluids/Electrolytes/Nutrition: Routine in and outs             CMP ordered for monday 8. Headaches. Depakote DC'd             Tylenol as needed 9. 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 Follow-up cardiology services             HR in 90's  10. Diabetes mellitus with hyperglycemia. Hemoglobin A1c 7.5. SSI. Patient on Glucophage 500 mg twice daily prior to admission. Resume as needed             1/8 cbg's elevated: given Cr and ongoing nausea, will dc glucophage and begin trial of amaryl 1mg  daily 11. Hypertension.  Imdur 30 mg daily, Demadex 80 mg morning 60 mg nightly, metoprolol-XL 50 mg daily.              1/8 bp controlled 12.  Hyperlipidemia: Crestor 13. Obesity. BMI 30.81. Dietary follow-up    LOS: 0 days A FACE TO FACE EVALUATION WAS PERFORMED  3/8 10/01/2020, 7:27 PM

## 2020-09-02 NOTE — H&P (Signed)
Physical Medicine and Rehabilitation Admission H&P    Chief Complaint  Patient presents with  . Weakness  : HPI: Deborah [name] is a 70 year old right-handed female with history of borderline diabetes mellitus, chronic atrial fibrillation rheumatic heart disease status post mitral valve replacement tricuspid replacement maintained on chronic Coumadin, hypertension, hyperlipidemia, obesity with BMI 30.81.  History taken from chart review, daughter, and patient.  Patient lives with her family independent prior to admission. 1 level home 3 steps to entry independent and active. She does not drive.  She presented on 08/24/2020 with headache, nausea, vomiting and decreased mobility x2 days. 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 No downward herniation. CT abdomen pelvis showed large amount of stool no other abnormality identified. Chest x-ray showed marked cardiac enlargement with pulmonary vascular congestion. Admission chemistries hemoglobin 11, blood cultures no growth, lactic acid 2.9 INR 2.1 troponin negative. 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 Cardiology services continues to follow for cardiac history.  Follow-up cranial CT scan 08/31/2020 relatively stable size of intraparenchymal hemorrhage as compared to prior tracings.  Was on Depakote for headaches, however daughter states patient does not take this medication anymore.. Blood pressure monitored patient did require Cleviprex.  Echocardiogram ejection fraction 55 to 60%, no wall motion abnormalities. Currently on a regular diet. Due to patient's decreased functional ability therapy evaluations completed and patient was admitted for a comprehensive rehab program.  Patient with resulting functional deficits with  mobility, balance, endurance, self-care.  Please see preadmission assessment from earlier today as well.  Review of Systems  Constitutional: Positive for diaphoresis and malaise/fatigue. Negative for chills and fever.  HENT: Negative for hearing loss.   Eyes: Negative for blurred vision and double vision.  Respiratory: Positive for shortness of breath.   Gastrointestinal: Positive for constipation, nausea and vomiting. Negative for heartburn.  Genitourinary: Negative for dysuria, flank pain and hematuria.  Musculoskeletal: Positive for myalgias.  Skin: Negative for rash.  Neurological: Positive for dizziness, focal weakness and headaches. Negative for sensory change and speech change.  All other systems reviewed and are negative.  Past Medical History:  Diagnosis Date  . Allergic rhinitis   . Anemia   . Borderline diabetes   . Carpal tunnel syndrome   . Chronic atrial fibrillation (HCC)    coumadin managed by [facility]. CVRR;  Event montor (7/12-8/12) showed no high rate episodes (patient continuously in atrial fibrillation).   . Chronic diastolic heart failure (HCC)   . Eosinophilia 01/27/2013  . External hemorrhoid   . External hemorrhoids   . Fatigue   . Gout   . 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 Gaylyn Rong  . Hx of cardiovascular stress test    ETT-myoview (7/12): No evidence for ischemia or infarction  . Hyperlipidemia   . Hypertension   . Obesity   . Rheumatic heart disease    Status post mechanical ([employer]) mitral valve replacement and tricuspid valve repair at [facility] in 2002; echo 5/12:   EF 60-65%, mild AI, mitral valve prosthesis with AVA 1.66, severe LAE, moderate RAE, moderate to severe TR, mild increased pulmonary artery systolic pressure;    Adenosine Cardiolite in 4/09:   No ischemia, EF 66%  . Rotator cuff syndrome    Past Surgical History:  Procedure Laterality Date  . CARDIAC CATHETERIZATION     EF 55%  .  TRICUSPID VALVE REPAIR     Family History  Problem Relation Age of Onset  . Stroke Mother   . Diabetes Mother   . Heart failure Mother        pacemaker  . Heart disease Father        "heart problems"  . Heart disease Sister   . Heart disease Brother   . Gout Brother   . Diabetes Brother   . Stroke Brother   . Valvular heart disease Sister    Social History:  reports that she has never smoked. She has never used smokeless tobacco. She reports that she does not drink alcohol and does not use drugs. Allergies:  Allergies  Allergen Reactions  . Promethazine Hcl Other (See Comments)    REACTION: made her real shaky   Medications Prior to Admission  Medication Sig Dispense Refill  . ACCU-CHEK SOFTCLIX LANCETS lancets USE AS DIRECTED TO test blood sugar 100 each 6  . acetaminophen (TYLENOL) 500 MG tablet Take 500-1,000 mg by mouth every 6 (six) hours as needed for fever (pain).    Marland Kitchen allopurinol (ZYLOPRIM) 300 MG tablet TAKE 1 TABLET BY MOUTH EVERY DAY (Patient taking differently: Take 300 mg by mouth daily.) 90 tablet 3  . aspirin 81 MG chewable tablet Chew 81 mg by mouth daily.    . cetirizine (ZYRTEC) 10 MG tablet Take 10 mg by mouth daily as needed for allergies.    . cyanocobalamin 500 MCG tablet Take 1 tablet (500 mcg total) by mouth daily. 30 tablet 5  . cycloSPORINE (RESTASIS) 0.05 % ophthalmic emulsion Place 1 drop into both eyes daily as needed (dry eyes).    . diazepam (VALIUM) 5 MG tablet Take 5 mg by mouth every 8 (eight) hours as needed for anxiety.     . diclofenac Sodium (VOLTAREN) 1 % GEL APPLY 2 GRAMS TO AFFECTED AREA 4 TIMES A DAY (Patient taking differently: Apply 2 g topically 4 (four) times daily.) 100 g 3  . famotidine (PEPCID) 20 MG tablet Take 20 mg by mouth daily.    . folic acid (FOLVITE) 1 MG tablet TAKE 1 TABLET BY MOUTH EVERY DAY (Patient taking differently: Take 1 mg by mouth daily.) 90 tablet 3  . glucose blood (ACCU-CHEK AVIVA PLUS) test strip 1 each by  Other route as needed for other. Use as needed for symptoms of low or high blood sugars 100 strip 12  . isosorbide mononitrate (IMDUR) 30 MG 24 hr tablet Take 30 mg by mouth daily.    . meclizine (ANTIVERT) 25 MG tablet TAKE 1 TABLET (25 MG TOTAL) BY MOUTH 3 (THREE) TIMES DAILY AS NEEDED FOR DIZZINESS. 30 tablet 0  . Menthol-Methyl Salicylate (TIGER BALM LINIMENT EX) Apply 1 application topically daily.    . metFORMIN (GLUCOPHAGE) 500 MG tablet TAKE 1 TABLET (500 MG TOTAL) BY MOUTH 2 (TWO) TIMES DAILY WITH A MEAL. 180 tablet 1  . metoprolol succinate (TOPROL-XL) 50 MG 24 hr tablet TAKE 1 TABLET BY MOUTH EVERY DAY (Patient taking differently: Take 50 mg by mouth daily.) 90 tablet 3  . nitroGLYCERIN (NITROSTAT) 0.4 MG SL tablet Place 0.4 mg under the tongue every 5 (five) minutes as needed for chest pain.    Marland Kitchen Olopatadine HCl 0.7 % SOLN Place 1 drop into both eyes daily as needed (itching/ irritation).    Marland Kitchen omeprazole (PRILOSEC) 20 MG capsule TAKE 1 CAPSULE  2 (TWO) TIMES DAILY BEFORE A MEAL. TAKE WITH OMEPRAZOLE 40 MG TO EQUAL 60 MG (Patient taking differently: Take 20 mg by mouth 2 (two) times daily before a meal.) 180 capsule 3  . potassium chloride SA (KLOR-CON M20) 20 MEQ tablet Take 3 tablets (60 mEq total) by mouth 2 (two) times daily. 540 tablet 2  . rosuvastatin (CRESTOR) 10 MG tablet TAKE 1 TABLET BY MOUTH EVERY DAY (Patient taking differently: Take 10 mg by mouth daily.) 90 tablet 3  . torsemide (DEMADEX) 20 MG tablet TAKE 4 TABLETS (80 MG TOTAL) EVERY MORNING AND 3 TABLETS (60 MG TOTAL) EVERY EVENING. (Patient taking differently: Take 60-80 mg by mouth See admin instructions. TAKE 4 TABLETS (80 MG TOTAL) EVERY MORNING AND 3 TABLETS (60 MG TOTAL) EVERY EVENING.) 630 tablet 1  . zinc sulfate 220 (50 Zn) MG capsule Take 220 mg by mouth daily.      Drug Regimen Review Drug regimen was reviewed and remains appropriate with no significant issues identified  Home: Home Living Family/patient  expects to be discharged to:: Private residence Living Arrangements: Children,Spouse/significant other Available Help at Discharge: Family,Available 24 hours/day Type of Home: House Home Access: Stairs to enter [employer] of Steps: front 3; back 9 Entrance Stairs-Rails: None (front steps) Home Layout: One level Bathroom Shower/Tub: Engineer, manufacturing systems: Standard Home Equipment: None   Functional History: Prior Function Level of Independence: Independent Comments: ADLs and IADLs. Enjoys walking for exercises. Does not drive  Functional Status:  Mobility: Bed Mobility Overal bed mobility: Needs Assistance Bed Mobility: Rolling,Sidelying to Sit,Sit to Sidelying Rolling: Min assist Sidelying to sit: Mod assist Supine to sit: Mod assist,+2 for physical assistance Sit to supine: Mod assist,+2 for physical assistance Sit to sidelying: Min assist General bed mobility comments: PT attempts to provide instruction on gaze stabilization during mobility, pt with some difficulty following  commnads for eye movement followed by head movement Transfers Overall transfer level: Needs assistance Equipment used: Rolling walker (2 wheeled) Transfers: Sit to/from [employer] Sit to Stand: Mod assist,Min assist (modA initially progressing to minA) Stand pivot transfers: Min assist General transfer comment: modA to power up due to pt fear of dizziness and nausa, used gaze stabilization Ambulation/Gait Ambulation/Gait assistance: Min assist Gait Distance (Feet): 9 Feet Assistive device: Rolling walker (2 wheeled) Gait Pattern/deviations: Step-to pattern General Gait Details: pt with short step length and slowed gait speed, limited by dizziness during session Gait velocity: reduced Gait velocity interpretation: <1.31 ft/sec, indicative of household ambulator    ADL: ADL Overall ADL's : Needs assistance/impaired Eating/Feeding: Moderate assistance,Bed  level Eating/Feeding Details (indicate cue type and reason): daughter feeding patient. pt states "ate alot" Grooming: Wash/dry face,Modified independent,Bed level Grooming Details (indicate cue type and reason): blowing nose in bed on arrival . pt noted to remove large clot from nasal passage Upper Body Bathing: Maximal assistance Lower Body Bathing: Total assistance Toilet Transfer: +2 for physical assistance,Moderate assistance Functional mobility during ADLs: +2 for physical assistance,Moderate assistance General ADL Comments: session focused on basic transfer to help with 3n1 transfers and OOB to chair for meals. pt has prolonged bed level care for this stay and needs to progress oob  Cognition: Cognition Overall Cognitive Status: Within Functional Limits for tasks assessed Arousal/Alertness:  (awake) Orientation Level: Oriented X4 Attention: Sustained Sustained Attention: Appears intact Memory: Appears intact Awareness: Appears intact Problem Solving: Appears intact Safety/Judgment: Appears intact Cognition Arousal/Alertness: Awake/alert Behavior During Therapy: WFL for tasks assessed/performed Overall Cognitive Status: Within Functional  Limits for tasks assessed General Comments: pt able to follow commands consistently  Physical Exam: Blood pressure 130/82, pulse 77, temperature 97.8 F (36.6 C), temperature source Oral, resp. rate 18, height 4\' 11"  (1.499 m), weight 67 kg, SpO2 96 %. Physical Exam Vitals reviewed.  Constitutional:      General: She is not in acute distress.    Appearance: She is not ill-appearing.  HENT:     Head: Normocephalic and atraumatic.     Right Ear: External ear normal.     Nose: Nose normal.  Eyes:     General:        Right eye: No discharge.        Left eye: No discharge.     Extraocular Movements: Extraocular movements intact.  Cardiovascular:     Comments: Irregularly irregular Pulmonary:     Effort: Pulmonary effort is normal. No  respiratory distress.     Breath sounds: No stridor.  Abdominal:     General: Abdomen is flat. Bowel sounds are normal. There is no distension.  Musculoskeletal:     Cervical back: Normal range of motion and neck supple.     Comments: No edema or tenderness in extremities  Skin:    General: Skin is warm and dry.  Neurological:     Mental Status: She is alert.     Comments: Alert and oriented x3 Speech is low tone but provides her name and place. Follows simple commands. Motor: RUE/RLE: 5/5 proximal distal LUE: 4/5 proximal distal LLE: 4-4+/5 proximal to distal  Psychiatric:        Mood and Affect: Mood normal. Affect is flat.        Behavior: Behavior normal.     Results for orders placed or performed during the hospital encounter of 08/24/20 (from the past 48 hour(s))  Glucose, capillary     Status: Abnormal   Collection Time: 08/31/20  5:03 PM  Result Value Ref Range   Glucose-Capillary 204 (H) 70 - 99 mg/dL    Comment: Glucose reference range applies only to samples taken after fasting for at least 8 hours.  Glucose, capillary     Status: Abnormal   Collection Time: 08/31/20  9:35 PM  Result Value Ref Range   Glucose-Capillary 229 (H) 70 - 99 mg/dL    Comment: Glucose reference range applies only to samples taken after fasting for at least 8 hours.   Comment 1 Notify RN    Comment 2 Document in Chart   Heparin level (unfractionated)     Status: None   Collection Time: 09/01/20  2:39 AM  Result Value Ref Range   Heparin Unfractionated 0.32 0.30 - 0.70 IU/mL    Comment: (NOTE) If heparin results are below expected values, and patient dosage has  been confirmed, suggest follow up testing of antithrombin III levels. Performed at [facility], [place] [address] [place], [place] [address]   CBC     Status: Abnormal   Collection Time: 09/01/20  2:39 AM  Result Value Ref Range   WBC 9.1 4.0 - 10.5 K/uL   RBC 5.87 (H) 3.87 - 5.11 MIL/uL   Hemoglobin 13.6 12.0 - 15.0  g/dL   HCT 43.1 36.0 - 46.0 %   MCV 73.4 (L) 80.0 - 100.0 fL   MCH 23.2 (L) 26.0 - 34.0 pg   MCHC 31.6 30.0 - 36.0 g/dL   RDW 16.4 (H) 11.5 - 15.5 %   Platelets 192 150 - 400 K/uL  nRBC 0.0 0.0 - 0.2 %    Comment: Performed at [id number], [address] [place], [place] [address]  Basic metabolic panel     Status: Abnormal   Collection Time: 09/01/20  2:39 AM  Result Value Ref Range   Sodium 134 (L) 135 - 145 mmol/L   Potassium 4.0 3.5 - 5.1 mmol/L   Chloride 96 (L) 98 - 111 mmol/L   CO2 24 22 - 32 mmol/L   Glucose, Bld 179 (H) 70 - 99 mg/dL    Comment: Glucose reference range applies only to samples taken after fasting for at least 8 hours.   BUN 42 (H) 8 - 23 mg/dL   Creatinine, Ser 4.74 (H) 0.44 - 1.00 mg/dL   Calcium 25.9 (H) 8.9 - 10.3 mg/dL   GFR, Estimated 41 (L) >60 mL/min    Comment: (NOTE) Calculated using the CKD-EPI Creatinine Equation (2021)    Anion gap 14 5 - 15    Comment: Performed at [facility], [address] [place], [place] [address]  Glucose, capillary     Status: Abnormal   Collection Time: 09/01/20  6:02 AM  Result Value Ref Range   Glucose-Capillary 201 (H) 70 - 99 mg/dL    Comment: Glucose reference range applies only to samples taken after fasting for at least 8 hours.   Comment 1 Notify RN    Comment 2 Document in Chart   Glucose, capillary     Status: Abnormal   Collection Time: 09/01/20 12:10 PM  Result Value Ref Range   Glucose-Capillary 256 (H) 70 - 99 mg/dL    Comment: Glucose reference range applies only to samples taken after fasting for at least 8 hours.  Glucose, capillary     Status: Abnormal   Collection Time: 09/01/20  3:52 PM  Result Value Ref Range   Glucose-Capillary 292 (H) 70 - 99 mg/dL    Comment: Glucose reference range applies only to samples taken after fasting for at least 8 hours.  Glucose, capillary     Status: Abnormal   Collection Time: 09/01/20  9:06 PM  Result Value Ref Range   Glucose-Capillary  189 (H) 70 - 99 mg/dL    Comment: Glucose reference range applies only to samples taken after fasting for at least 8 hours.   Comment 1 Notify RN    Comment 2 Document in Chart   Heparin level (unfractionated)     Status: None   Collection Time: 09/24/2020  1:31 AM  Result Value Ref Range   Heparin Unfractionated 0.30 0.30 - 0.70 IU/mL    Comment: (NOTE) If heparin results are below expected values, and patient dosage has  been confirmed, suggest follow up testing of antithrombin III levels. Performed at [facility], [address], [place], [place] [address]   Basic metabolic panel     Status: Abnormal   Collection Time: 09/18/2020  1:31 AM  Result Value Ref Range   Sodium 134 (L) 135 - 145 mmol/L   Potassium 4.8 3.5 - 5.1 mmol/L   Chloride 99 98 - 111 mmol/L   CO2 23 22 - 32 mmol/L   Glucose, Bld 193 (H) 70 - 99 mg/dL    Comment: Glucose reference range applies only to samples taken after fasting for at least 8 hours.   BUN 44 (H) 8 - 23 mg/dL   Creatinine, Ser 2.95 (H) 0.44 - 1.00 mg/dL   Calcium 18.8 (H) 8.9 - 10.3 mg/dL  GFR, Estimated 33 (L) >60 mL/min    Comment: (NOTE) Calculated using the CKD-EPI Creatinine Equation (2021)    Anion gap 12 5 - 15    Comment: Performed at [facility], [address] [place], [place] [address]  Glucose, capillary     Status: Abnormal   Collection Time: 09/04/2020  6:17 AM  Result Value Ref Range   Glucose-Capillary 213 (H) 70 - 99 mg/dL    Comment: Glucose reference range applies only to samples taken after fasting for at least 8 hours.   Comment 1 Notify RN    Comment 2 Document in Chart   Glucose, capillary     Status: Abnormal   Collection Time: 09/25/2020 12:00 PM  Result Value Ref Range   Glucose-Capillary 197 (H) 70 - 99 mg/dL    Comment: Glucose reference range applies only to samples taken after fasting for at least 8 hours.   No results found.     Medical Problem List and Plan: 1. 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  Antithrombotics: -DVT/anticoagulation: Intravenous heparin  -antiplatelet therapy: N/A 3. Pain Management: Tylenol as needed 4. Mood: Provide emotional support  -antipsychotic agents: N/A 5. Neuropsych: This patient is capable of making decisions on her own behalf. 6. Skin/Wound Care: Routine skin checks 7. Fluids/Electrolytes/Nutrition: Routine in and outs  CMP ordered 8. Headaches. Depakote DC'd  Tylenol as needed 9. 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 Follow-up cardiology services  Monitor with increased exertion 10. Diabetes mellitus with hyperglycemia. Hemoglobin A1c 7.5. SSI. Patient on Glucophage 500 mg twice daily prior to admission. Resume as needed  Monitor with increased mobility 11. Hypertension.  Imdur 30 mg daily, Demadex 80 mg morning 60 mg nightly, metoprolol-XL 50 mg daily.   Monitor with increased mobility 12.  Hyperlipidemia: Crestor 13. Obesity. BMI 30.81. Dietary follow-up  Charlton Amor, PA-C 09/09/2020  I have personally performed a face to face diagnostic evaluation, including, but not limited to relevant history and physical exam findings, of this patient and developed relevant assessment and plan.  Additionally, I have reviewed and concur with the physician assistant's documentation above.  Maryla Morrow, MD, ABPMR  The patient's status has not changed. Any changes from the pre-admission screening or documentation from the acute chart are noted above.   Maryla Morrow, MD, ABPMR

## 2020-09-02 NOTE — Plan of Care (Signed)
  Problem: Education: Goal: Knowledge of General Education information will improve Description: Including pain rating scale, medication(s)/side effects and non-pharmacologic comfort measures Outcome: Adequate for Discharge   Problem: Health Behavior/Discharge Planning: Goal: Ability to manage health-related needs will improve Outcome: Adequate for Discharge   Problem: Clinical Measurements: Goal: Ability to maintain clinical measurements within normal limits will improve Outcome: Adequate for Discharge Goal: Will remain free from infection Outcome: Adequate for Discharge Goal: Diagnostic test results will improve Outcome: Adequate for Discharge Goal: Respiratory complications will improve Outcome: Adequate for Discharge Goal: Cardiovascular complication will be avoided Outcome: Adequate for Discharge   Problem: Activity: Goal: Risk for activity intolerance will decrease Outcome: Adequate for Discharge   Problem: Nutrition: Goal: Adequate nutrition will be maintained Outcome: Adequate for Discharge   Problem: Coping: Goal: Level of anxiety will decrease Outcome: Adequate for Discharge   Problem: Elimination: Goal: Will not experience complications related to bowel motility Outcome: Adequate for Discharge Goal: Will not experience complications related to urinary retention Outcome: Adequate for Discharge   Problem: Pain Managment: Goal: General experience of comfort will improve Outcome: Adequate for Discharge   Problem: Safety: Goal: Ability to remain free from injury will improve Outcome: Adequate for Discharge   Problem: Skin Integrity: Goal: Risk for impaired skin integrity will decrease Outcome: Adequate for Discharge   Problem: Education: Goal: Knowledge of disease or condition will improve Outcome: Adequate for Discharge Goal: Knowledge of secondary prevention will improve Outcome: Adequate for Discharge Goal: Knowledge of patient specific risk factors  addressed and post discharge goals established will improve Outcome: Adequate for Discharge Goal: Individualized Educational Video(s) Outcome: Adequate for Discharge   Problem: Coping: Goal: Will identify appropriate support needs Outcome: Adequate for Discharge   Problem: Health Behavior/Discharge Planning: Goal: Ability to manage health-related needs will improve Outcome: Adequate for Discharge   Problem: Self-Care: Goal: Ability to participate in self-care as condition permits will improve Outcome: Adequate for Discharge Goal: Verbalization of feelings and concerns over difficulty with self-care will improve Outcome: Adequate for Discharge Goal: Ability to communicate needs accurately will improve Outcome: Adequate for Discharge   Problem: Nutrition: Goal: Risk of aspiration will decrease Outcome: Adequate for Discharge Goal: Dietary intake will improve Outcome: Adequate for Discharge   Problem: Intracerebral Hemorrhage Tissue Perfusion: Goal: Complications of Intracerebral Hemorrhage will be minimized Outcome: Adequate for Discharge

## 2020-09-02 NOTE — Discharge Summary (Addendum)
Stroke Discharge Summary  Patient ID: Deborah [name]   MRN: [id number]      DOB: August 14, 1951  Date of Admission: 2020/09/05 Date of Discharge: September 05, 2020  Attending Physician:  Marcello Fennel, MD, Stroke MD Consultant(s):   neurology Dr. 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 with supratherepeutic INR s/p reversal Principal Problem:   Cerebellar stroke [facility]) Active Problems:   Intraparenchymal hemorrhage of brain (HCC)   Medications to be continued on Rehab  Current Facility-Administered Medications:    acetaminophen (TYLENOL) tablet 650 mg, 650 mg, Oral, Q4H PRN **OR** acetaminophen (TYLENOL) 160 MG/5ML solution 650 mg, 650 mg, Per Tube, Q4H PRN **OR** acetaminophen (TYLENOL) suppository 650 mg, 650 mg, Rectal, Q4H PRN, Angiulli, Mcarthur Rossetti, PA-C   benzonatate (TESSALON) capsule 100 mg, 100 mg, Oral, TID PRN, Angiulli, Mcarthur Rossetti, PA-C   heparin ADULT infusion 100 units/mL (25000 units/238mL), 750 Units/hr, Intravenous, Continuous, Angiulli, Daniel J, PA-C   insulin aspart (novoLOG) injection 0-15 Units, 0-15 Units, Subcutaneous, TID WC, Angiulli, Mcarthur Rossetti, PA-C   [START ON 09/03/2020] isosorbide mononitrate (IMDUR) 24 hr tablet 30 mg, 30 mg, Oral, Daily, Angiulli, Mcarthur Rossetti, PA-C   meclizine (ANTIVERT) tablet 25 mg, 25 mg, Oral, TID PRN, Angiulli, Mcarthur Rossetti, PA-C   [START ON 09/03/2020] metoprolol succinate (TOPROL-XL) 24 hr tablet 50 mg, 50 mg, Oral, Daily, Angiulli, Daniel J, PA-C   ondansetron (ZOFRAN-ODT) disintegrating tablet 4 mg, 4 mg, Oral, TID PRN, Angiulli, Mcarthur Rossetti, PA-C   pantoprazole (PROTONIX) EC tablet 40 mg, 40 mg, Oral, QHS, Angiulli, Daniel J, PA-C   potassium chloride SA (KLOR-CON) CR tablet 40 mEq, 40 mEq, Oral, BID, Angiulli, Mcarthur Rossetti, PA-C   [START ON 09/03/2020] rosuvastatin (CRESTOR) tablet 10 mg, 10 mg, Oral, Daily, Angiulli, Daniel J, PA-C   senna-docusate (Senokot-S) tablet 1  tablet, 1 tablet, Oral, BID, Angiulli, Mcarthur Rossetti, PA-C   [START ON 09/03/2020] torsemide (DEMADEX) tablet 80 mg, 80 mg, Oral, q morning - 10a **AND** torsemide (DEMADEX) tablet 60 mg, 60 mg, Oral, QHS, Charlton Amor, PA-C   LABORATORY STUDIES CBC    Component Value Date/Time   WBC 9.1 09/01/2020 0239   RBC 5.87 (H) 09/01/2020 0239   HGB 13.6 09/01/2020 0239   HGB 11.1 07/15/2018 1506   HGB 11.8 07/15/2017 0919   HCT 43.1 09/01/2020 0239   HCT 36.4 07/15/2018 1506   HCT 37.4 07/15/2017 0919   PLT 192 09/01/2020 0239   PLT 218 07/15/2018 1506   MCV 73.4 (L) 09/01/2020 0239   MCV 77 (L) 07/15/2018 1506   MCV 74.9 (L) 07/15/2017 0919   MCH 23.2 (L) 09/01/2020 0239   MCHC 31.6 09/01/2020 0239   RDW 16.4 (H) 09/01/2020 0239   RDW 16.4 (H) 07/15/2018 1506   RDW 16.5 (H) 07/15/2017 0919   LYMPHSABS 3.9 08/24/2020 2003   LYMPHSABS 2.4 07/15/2017 0919   MONOABS 1.0 08/24/2020 2003   MONOABS 0.8 07/15/2017 0919   EOSABS 0.7 (H) 08/24/2020 2003   EOSABS 0.3 07/15/2017 0919   BASOSABS 0.1 08/24/2020 2003   BASOSABS 0.1 07/15/2017 0919   CMP    Component Value Date/Time   NA 134 (L) 09/05/20 0131   NA 142 07/15/2018 1506   NA 142 07/15/2017 0919   K 4.8 September 05, 2020 0131   K 4.3 07/15/2017 0919   CL 99 September 05, 2020 0131   CL 108 (H) 02/12/2013 0813   CO2 23 09/05/2020 0131  CO2 29 07/15/2017 0919   GLUCOSE 193 (H) 09/05/2020 0131   GLUCOSE 125 07/15/2017 0919   GLUCOSE 115 (H) 02/12/2013 0813   BUN 44 (H) 08/30/2020 0131   BUN 16 07/15/2018 1506   BUN 30.2 (H) 07/15/2017 0919   CREATININE 1.67 (H) 09/13/2020 0131   CREATININE 1.5 (H) 07/15/2017 0919   CALCIUM 10.8 (H) 09/24/2020 0131   CALCIUM 10.2 07/15/2017 0919   PROT 6.5 08/24/2020 2115   PROT 8.1 07/15/2017 0919   ALBUMIN 3.6 08/24/2020 2115   ALBUMIN 4.2 07/15/2017 0919   AST 37 08/24/2020 2115   AST 26 07/15/2017 0919   ALT 22 08/24/2020 2115   ALT 28 07/15/2017 0919   ALKPHOS 69 08/24/2020 2115   ALKPHOS  80 07/15/2017 0919   BILITOT 0.9 08/24/2020 2115   BILITOT 1.24 (H) 07/15/2017 0919   GFRNONAA 33 (L) 08/31/2020 0131   GFRNONAA 54 (L) 09/05/2016 1127   GFRAA 44 (L) 05/16/2020 1126   GFRAA 62 09/05/2016 1127   COAGS Lab Results  Component Value Date   INR 1.0 08/31/2020   INR 1.1 08/30/2020   INR 1.0 08/29/2020   Lipid Panel    Component Value Date/Time   CHOL 150 08/24/2020 2115   CHOL 182 05/21/2018 1208   TRIG 170 (H) 08/24/2020 2115   HDL 45 08/24/2020 2115   HDL 57 05/21/2018 1208   CHOLHDL 3.3 08/24/2020 2115   VLDL 34 08/24/2020 2115   LDLCALC 71 08/24/2020 2115   LDLCALC 86 05/21/2018 1208   HgbA1C  Lab Results  Component Value Date   HGBA1C 7.5 (H) 08/24/2020   Urinalysis    Component Value Date/Time   COLORURINE STRAW (A) 08/25/2020 0405   APPEARANCEUR CLEAR 08/25/2020 0405   LABSPEC 1.025 08/25/2020 0405   PHURINE 6.0 08/25/2020 0405   GLUCOSEU 150 (A) 08/25/2020 0405   HGBUR NEGATIVE 08/25/2020 0405   HGBUR small 08/04/2010 1007   BILIRUBINUR NEGATIVE 08/25/2020 0405   BILIRUBINUR NEG 06/12/2016 1030   KETONESUR NEGATIVE 08/25/2020 0405   PROTEINUR NEGATIVE 08/25/2020 0405   UROBILINOGEN 0.2 06/12/2016 1030   UROBILINOGEN 0.2 09/17/2014 1158   NITRITE NEGATIVE 08/25/2020 0405   LEUKOCYTESUR NEGATIVE 08/25/2020 0405   Urine Drug Screen No results found for: LABOPIA, COCAINSCRNUR, LABBENZ, AMPHETMU, THCU, LABBARB  Alcohol Level No results found for: ETH   SIGNIFICANT DIAGNOSTIC STUDIES CT ANGIO HEAD W OR WO CONTRAST  Result Date: 08/25/2020 CLINICAL DATA:  Stroke/TIA, assess intracranial arteries EXAM: CT ANGIOGRAPHY HEAD AND NECK TECHNIQUE: Multidetector CT imaging of the head and neck was performed using the standard protocol during bolus administration of intravenous contrast. Multiplanar CT image reconstructions and MIPs were obtained to evaluate the vascular anatomy. Carotid stenosis measurements (when applicable) are obtained utilizing  NASCET criteria, using the distal internal carotid diameter as the denominator. CONTRAST:  38mL OMNIPAQUE IOHEXOL 350 MG/ML SOLN COMPARISON:  08/24/2020 and prior.  Concurrent noncontrast head CT. FINDINGS: CTA NECK FINDINGS Aortic arch: Standard branching. Imaged portion shows no evidence of aneurysm or dissection. No significant stenosis of the major arch vessel origins. Minimal atherosclerotic calcifications. Right carotid system: No evidence of dissection, stenosis (50% or greater) or occlusion. Left carotid system: No evidence of dissection, stenosis (50% or greater) or occlusion. Vertebral arteries: Dominant right vertebral artery. No evidence of dissection, stenosis (50% or greater) or occlusion. Skeleton: No acute or suspicious osseous abnormalities. Post sternotomy sequela. Other neck: No adenopathy.  No soft tissue mass. Upper chest: Upper lung atelectasis.  No acute  finding. Review of the MIP images confirms the above findings CTA HEAD FINDINGS Anterior circulation: No significant stenosis, proximal occlusion, aneurysm, or vascular malformation. Bilateral carotid siphon atherosclerotic calcifications. Posterior circulation: No significant stenosis, proximal occlusion, aneurysm, or vascular malformation. Venous sinuses: As permitted by contrast timing, patent. Anatomic variants: None. Review of the MIP images confirms the above findings IMPRESSION: No large vessel occlusion, dissection or aneurysm. No high-grade narrowing of the major intracranial or neck vessels. Cerebellar vermis hemorrhage is better evaluated on concurrent head CT. Electronically Signed   By: Stana Bunting M.D.   On: 08/25/2020 09:30   CT HEAD WO CONTRAST  Result Date: 08/31/2020 CLINICAL DATA:  Follow-up examination for acute stroke. EXAM: CT HEAD WITHOUT CONTRAST TECHNIQUE: Contiguous axial images were obtained from the base of the skull through the vertex without intravenous contrast. COMPARISON:  Prior CT from 08/29/2020.  FINDINGS: Brain: Previously identified hemorrhage centered at the central cerebellar vermis is not significantly changed in size, measuring 1.8 x 2.0 x 1.6 cm. Hemorrhage itself is slightly less dense as compared to previous exam. Similar surrounding vasogenic edema without significant regional mass effect. Adjacent fourth ventricle remains widely patent. No hydrocephalus. No other new intracranial hemorrhage or large vessel territory infarct. Chronic microvascular ischemic disease again noted. No midline shift or extra-axial fluid collection. Vascular: No hyperdense vessel. Scattered vascular calcifications noted within the carotid siphons. Skull: Scalp soft tissues and calvarium are stable in appearance with no new finding. Sinuses/Orbits: Globes and orbital soft tissues demonstrate no acute finding. Paranasal sinuses remain clear. No mastoid effusion. Other: None. IMPRESSION: 1. No significant interval change in size of intraparenchymal hemorrhage centered at the cerebellar vermis. Similar surrounding mild edema without significant regional mass effect. 2. No other new acute intracranial abnormality. Electronically Signed   By: Rise Mu M.D.   On: 08/31/2020 04:46   CT HEAD WO CONTRAST  Result Date: 08/29/2020 CLINICAL DATA:  Intracranial hemorrhage follow up EXAM: CT HEAD WITHOUT CONTRAST TECHNIQUE: Contiguous axial images were obtained from the base of the skull through the vertex without intravenous contrast. COMPARISON:  08/25/2020 FINDINGS: Brain: Unchanged focus of hemorrhage in the cerebellar vermis. There is periventricular hypoattenuation compatible with chronic microvascular disease. No midline shift or hydrocephalus. No significant mass effect. Vascular: No abnormal hyperdensity of the major intracranial arteries or dural venous sinuses. No intracranial atherosclerosis. Skull: The visualized skull base, calvarium and extracranial soft tissues are normal. Sinuses/Orbits: No fluid levels  or advanced mucosal thickening of the visualized paranasal sinuses. No mastoid or middle ear effusion. The orbits are normal. IMPRESSION: Unchanged focus of hemorrhage in the cerebellar vermis. Electronically Signed   By: Deatra Robinson M.D.   On: 08/29/2020 03:53   CT HEAD WO CONTRAST  Result Date: 08/25/2020 CLINICAL DATA:  Suspected cerebral hemorrhage EXAM: CT HEAD WITHOUT CONTRAST TECHNIQUE: Contiguous axial images were obtained from the base of the skull through the vertex without intravenous contrast. COMPARISON:  CT head 08/25/2020.  MRI brain 08/24/2020 FINDINGS: Brain: Focal acute hemorrhage in the region of the cerebellar vermis measuring 2.2 cm in diameter. No significant change in appearance since prior study. No new hemorrhage is identified. Examination is otherwise unchanged. Vascular: Moderate intracranial arterial vascular calcifications. Skull: Calvarium appears intact. Sinuses/Orbits: Paranasal sinuses and mastoid air cells are clear. Other: None. IMPRESSION: Focal acute hemorrhage in the region of the cerebellar vermis measuring 2.2 cm in diameter. No significant change in appearance since prior study. No new hemorrhage is identified. Electronically Signed   By: Chrissie Noa  Andria MeuseStevens M.D.   On: 08/25/2020 20:43   CT HEAD WO CONTRAST  Result Date: 08/25/2020 CLINICAL DATA:  Cerebral hemorrhage EXAM: CT HEAD WITHOUT CONTRAST TECHNIQUE: Contiguous axial images were obtained from the base of the skull through the vertex without intravenous contrast. COMPARISON:  August 24, 2020 head CT and brain MRI FINDINGS: Brain: Focal hemorrhage in the midline cerebellar region measures 2.0 x 1.5 x 1.6 cm, stable by remeasurement, with slight surrounding edema, stable. There is no new focal hemorrhage. Slight impression on the posterior aspect of the fourth ventricle is stable. There is no fourth ventricular effacement. Elsewhere there is mild diffuse atrophy, stable. No extra-axial fluid collection or  midline shift. Mild small vessel disease in the centra semiovale bilaterally is stable. Vascular: No hyperdense vessel evident. Calcification noted in each carotid siphon region. Skull: Bony calvarium appears intact. Sinuses/Orbits: Visualized paranasal sinuses are clear. Orbits appear symmetric bilaterally. Other: Mastoid air cells are clear. IMPRESSION: Stable hemorrhage posterior to the fourth ventricle in the mid cerebellar region with slight impression on the posterior aspect of the fourth ventricle. No ventricular effacement. No new hemorrhage. Elsewhere there is mild atrophy with mild periventricular small vessel disease. No new focus of decreased attenuation evident. Foci of arterial vascular calcification noted. Electronically Signed   By: Bretta BangWilliam  Woodruff III M.D.   On: 08/25/2020 09:14   CT Head Wo Contrast  Result Date: 08/24/2020 CLINICAL DATA:  Mental status change EXAM: CT HEAD WITHOUT CONTRAST TECHNIQUE: Contiguous axial images were obtained from the base of the skull through the vertex without intravenous contrast. COMPARISON:  None. FINDINGS: Brain: Rounded intraparenchymal hemorrhage seen within the inter cerebellar hemisphere measuring 1.6 x 1.4 x 1.8 cm. There is mild surrounding mass effect upon the fourth ventricle. No downward herniation is seen. No extra-axial collections or other intraparenchymal hemorrhage is seen. There is mild dilatation the ventricles and sulci consistent with age-related atrophy. Low-attenuation changes in the deep white matter consistent with small vessel ischemia. Vascular: No hyperdense vessel or unexpected calcification. Skull: The skull is intact. No fracture or focal lesion identified. Sinuses/Orbits: The visualized paranasal sinuses and mastoid air cells are clear. The orbits and globes intact. Other: None IMPRESSION: Focal intraparenchymal hemorrhage within the inter cerebellar hemisphere with mass effect upon the fourth ventricle. No downward herniation.  No extra-axial collections. Findings consistent with mild age related atrophy and chronic small vessel ischemia These results were called by telephone at the time of interpretation on 08/24/2020 at 8:56 pm to provider ADAM CURATOLO , who verbally acknowledged these results. Electronically Signed   By: Jonna ClarkBindu  Avutu M.D.   On: 08/24/2020 20:56   CT ANGIO NECK W OR WO CONTRAST  Result Date: 08/25/2020 CLINICAL DATA:  Stroke/TIA, assess intracranial arteries EXAM: CT ANGIOGRAPHY HEAD AND NECK TECHNIQUE: Multidetector CT imaging of the head and neck was performed using the standard protocol during bolus administration of intravenous contrast. Multiplanar CT image reconstructions and MIPs were obtained to evaluate the vascular anatomy. Carotid stenosis measurements (when applicable) are obtained utilizing NASCET criteria, using the distal internal carotid diameter as the denominator. CONTRAST:  75mL OMNIPAQUE IOHEXOL 350 MG/ML SOLN COMPARISON:  08/24/2020 and prior.  Concurrent noncontrast head CT. FINDINGS: CTA NECK FINDINGS Aortic arch: Standard branching. Imaged portion shows no evidence of aneurysm or dissection. No significant stenosis of the major arch vessel origins. Minimal atherosclerotic calcifications. Right carotid system: No evidence of dissection, stenosis (50% or greater) or occlusion. Left carotid system: No evidence of dissection, stenosis (50% or greater)  or occlusion. Vertebral arteries: Dominant right vertebral artery. No evidence of dissection, stenosis (50% or greater) or occlusion. Skeleton: No acute or suspicious osseous abnormalities. Post sternotomy sequela. Other neck: No adenopathy.  No soft tissue mass. Upper chest: Upper lung atelectasis.  No acute finding. Review of the MIP images confirms the above findings CTA HEAD FINDINGS Anterior circulation: No significant stenosis, proximal occlusion, aneurysm, or vascular malformation. Bilateral carotid siphon atherosclerotic calcifications.  Posterior circulation: No significant stenosis, proximal occlusion, aneurysm, or vascular malformation. Venous sinuses: As permitted by contrast timing, patent. Anatomic variants: None. Review of the MIP images confirms the above findings IMPRESSION: No large vessel occlusion, dissection or aneurysm. No high-grade narrowing of the major intracranial or neck vessels. Cerebellar vermis hemorrhage is better evaluated on concurrent head CT. Electronically Signed   By: Stana Buntinghikanele  Emekauwa M.D.   On: 08/25/2020 09:30   MR BRAIN W WO CONTRAST  Result Date: 08/24/2020 CLINICAL DATA:  Acute neurologic deficit EXAM: MRI HEAD WITHOUT AND WITH CONTRAST TECHNIQUE: Multiplanar, multiecho pulse sequences of the brain and surrounding structures were obtained without and with intravenous contrast. CONTRAST:  6.685mL GADAVIST GADOBUTROL 1 MMOL/ML IV SOLN COMPARISON:  Head CT 08/24/2020 FINDINGS: Brain: No acute infarct, mass effect or extra-axial collection. Unchanged appearance of hemorrhage within the cerebellar vermis. There is multifocal hyperintense T2-weighted signal within the white matter. Parenchymal volume and CSF spaces are normal. There is no abnormal contrast enhancement. Vascular: Major flow voids are preserved. Skull and upper cervical spine: Normal calvarium and skull base. Visualized upper cervical spine and soft tissues are normal. Sinuses/Orbits:No paranasal sinus fluid levels or advanced mucosal thickening. No mastoid or middle ear effusion. Normal orbits. IMPRESSION: 1. Unchanged appearance of cerebellar vermis hemorrhage. 2. No acute infarct or enhancing mass lesion. Electronically Signed   By: Deatra RobinsonKevin  Herman M.D.   On: 08/24/2020 23:01   CT ABDOMEN PELVIS W CONTRAST  Result Date: 08/24/2020 CLINICAL DATA:  Epigastric abdominal pain. EXAM: CT ABDOMEN AND PELVIS WITH CONTRAST TECHNIQUE: Multidetector CT imaging of the abdomen and pelvis was performed using the standard protocol following bolus  administration of intravenous contrast. CONTRAST:  100mL OMNIPAQUE IOHEXOL 300 MG/ML  SOLN COMPARISON:  None. FINDINGS: Lower chest: No acute abnormality. Hepatobiliary: No focal liver abnormality is seen. No gallstones, gallbladder wall thickening, or biliary dilatation. Pancreas: Unremarkable. No pancreatic ductal dilatation or surrounding inflammatory changes. Spleen: Normal in size without focal abnormality. Adrenals/Urinary Tract: Adrenal glands are unremarkable. Kidneys are normal, without renal calculi, focal lesion, or hydronephrosis. Bladder is unremarkable. Stomach/Bowel: Stomach appears normal. There is no evidence of bowel obstruction or inflammation. The appendix appears normal. Large amount of stool is seen in the sigmoid colon and rectum. Vascular/Lymphatic: Aortic atherosclerosis. No enlarged abdominal or pelvic lymph nodes. Reproductive: Uterus and bilateral adnexa are unremarkable. Other: No abdominal wall hernia or abnormality. No abdominopelvic ascites. Musculoskeletal: No acute or significant osseous findings. IMPRESSION: 1. Aortic atherosclerosis. 2. Large amount of stool is seen in the sigmoid colon and rectum. 3. No other abnormality seen in the abdomen or pelvis. Aortic Atherosclerosis (ICD10-I70.0). Electronically Signed   By: Lupita RaiderJames  Green Jr M.D.   On: 08/24/2020 20:53   DG Chest Port 1 View  Result Date: 08/28/2020 CLINICAL DATA:  Fluid overload EXAM: PORTABLE CHEST 1 VIEW COMPARISON:  08/26/2020 chest radiograph. FINDINGS: Intact sternotomy wires. Stable cardiomediastinal silhouette with moderate cardiomegaly. No pneumothorax. No significant pleural effusions. No pulmonary edema. Previously visualized nodular upper left perihilar opacity has resolved. No acute consolidative airspace disease. IMPRESSION:  Stable moderate cardiomegaly without pulmonary edema. No active pulmonary disease. Electronically Signed   By: Delbert Phenix M.D.   On: 08/28/2020 08:54   DG CHEST PORT 1  VIEW  Result Date: 08/26/2020 CLINICAL DATA:  Cough and weakness in a 70 year old female EXAM: PORTABLE CHEST 1 VIEW COMPARISON:  August 25, 2020 FINDINGS: Post median sternotomy. Epicardial pacer wires project over the heart as before. Cardiomediastinal contours with marked cardiac enlargement as on the prior study. Pulmonary vascular congestion. No lobar consolidative changes. Patchy basilar opacities in the retrocardiac region in particular. Nodular opacity in the LEFT upper chest nodule versus vessel on end. This measures approximately 1.3 cm. On limited assessment no acute skeletal process. IMPRESSION: 1. Marked cardiac enlargement with pulmonary vascular congestion. 2. Nodular opacity in the LEFT upper chest versus vessel on end. Follow-up PA and lateral chest is suggested to ensure resolution and or CT of the chest on follow-up after resolution of acute process. 3. No lobar consolidative changes. No overt pulmonary edema. 4. Question of LEFT lower lobe atelectasis, attention on follow-up. Electronically Signed   By: Donzetta Kohut M.D.   On: 08/26/2020 11:22   DG CHEST PORT 1 VIEW  Result Date: 08/25/2020 CLINICAL DATA:  Aspiration. EXAM: PORTABLE CHEST 1 VIEW COMPARISON:  08/24/2020, 07/15/2018 FINDINGS: No focal consolidation. No pleural effusion or pneumothorax. Stable cardiomegaly. Prior median sternotomy. No acute osseous abnormality. IMPRESSION: No acute cardiopulmonary disease. Electronically Signed   By: Elige Ko   On: 08/25/2020 09:45   DG Chest Portable 1 View  Result Date: 08/24/2020 CLINICAL DATA:  Altered mental status. EXAM: PORTABLE CHEST 1 VIEW COMPARISON:  April 07, 2019. FINDINGS: Stable cardiomegaly. No pneumothorax or pleural effusion is noted. Sternotomy wires are noted. Lungs are clear. Bony thorax is unremarkable. IMPRESSION: No active disease. Electronically Signed   By: Lupita Raider M.D.   On: 08/24/2020 20:36   ECHOCARDIOGRAM COMPLETE  Result Date:  08/26/2020    ECHOCARDIOGRAM REPORT   Patient Name:   Deborah [name] [facility] Date of Exam: 08/26/2020 Medical Rec #:  [id number]         Height:       59.0 in Accession #:    [id number]        Weight:       152.6 lb Date of Birth:  Nov 06, 1950          BSA:          1.644 m Patient Age:    69 years          BP:           119/64 mmHg Patient Gender: F                 HR:           88 bpm. Exam Location:  Inpatient Procedure: 2D Echo, Cardiac Doppler and Color Doppler Indications:    CVA  History:        Patient has prior history of Echocardiogram examinations, most                 recent 05/16/2020. CHF, Stroke, Mitral Valve Disease and TV                 repair, MVR; Arrythmias:Atrial Fibrillation.  Sonographer:    Lavenia Atlas Referring Phys: [id number] JINDONG XU IMPRESSIONS  1. Left ventricular ejection fraction, by estimation, is 55 to 60%. The left ventricle has normal function. The left ventricle has no regional wall motion  abnormalities. There is mild left ventricular hypertrophy. Left ventricular diastolic parameters are indeterminate.  2. Right ventricular systolic function is mildly reduced. The right ventricular size is normal. There is moderately elevated pulmonary artery systolic pressure. The estimated right ventricular systolic pressure is 45.0 mmHg.  3. Left atrial size was severely dilated.  4. Right atrial size was severely dilated.  5. 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 Mean gradient 9 mmHg but PHT 76 msec (short). Suspect that she does not have hemodynamically significant mitral mitral stenosis and there does not appear to be significant mitral regurgitation.  6. Status post tricuspid valve repair. Mean gradient 6 mmHg with mild-moderate tricuspid regurgitation.  7. The aortic valve is tricuspid. Aortic valve regurgitation is mild. Mild aortic valve sclerosis is present, with no evidence of aortic valve stenosis.  8. The inferior vena cava is dilated in size with <50% respiratory variability,  suggesting right atrial pressure of 15 mmHg.  9. The patient is in atrial fibrillation. FINDINGS  Left Ventricle: Left ventricular ejection fraction, by estimation, is 55 to 60%. The left ventricle has normal function. The left ventricle has no regional wall motion abnormalities. The left ventricular internal cavity size was normal in size. There is  mild left ventricular hypertrophy. Left ventricular diastolic parameters are indeterminate. Right Ventricle: The right ventricular size is normal. No increase in right ventricular wall thickness. Right ventricular systolic function is mildly reduced. There is moderately elevated pulmonary artery systolic pressure. The tricuspid regurgitant velocity is 2.74 m/s, and with an assumed right atrial pressure of 15 mmHg, the estimated right ventricular systolic pressure is 45.0 mmHg. Left Atrium: Left atrial size was severely dilated. Right Atrium: Right atrial size was severely dilated. Pericardium: There is no evidence of pericardial effusion. 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 Mean gradient 9 mmHg but PHT 76 msec (short). Suspect that she does not have hemodynamically significant mitral mitral stenosis and there does not appear to be significant mitral regurgitation. The mitral  valve is normal in structure. Trivial mitral valve regurgitation. MV peak gradient, 25.4 mmHg. The mean mitral valve gradient is 9.0 mmHg. Tricuspid Valve: Status post tricuspid valve repair. Mean gradient 6 mmHg with mild-moderate tricuspid regurgitation. The tricuspid valve is has been repaired/replaced. Tricuspid valve regurgitation is mild to moderate. Aortic Valve: The aortic valve is tricuspid. Aortic valve regurgitation is mild. Aortic regurgitation PHT measures 352 msec. Mild aortic valve sclerosis is present, with no evidence of aortic valve stenosis. Aortic valve peak gradient measures 13.0 mmHg. Pulmonic Valve: The pulmonic valve was normal in structure. Pulmonic valve  regurgitation is not visualized. Aorta: The aortic root is normal in size and structure. Venous: The inferior vena cava is dilated in size with less than 50% respiratory variability, suggesting right atrial pressure of 15 mmHg. IAS/Shunts: No atrial level shunt detected by color flow Doppler.  LEFT VENTRICLE PLAX 2D LVIDd:         4.10 cm  Diastology LVIDs:         2.60 cm  LV e' medial:    6.31 cm/s LV PW:         1.20 cm  LV E/e' medial:  32.5 LV IVS:        1.20 cm  LV e' lateral:   6.53 cm/s LVOT diam:     1.90 cm  LV E/e' lateral: 31.4 LV SV:         43 LV SV Index:   26 LVOT Area:  2.84 cm  RIGHT VENTRICLE RV Basal diam:  3.30 cm RV S prime:     4.79 cm/s TAPSE (M-mode): 2.9 cm LEFT ATRIUM              Index       RIGHT ATRIUM           Index LA diam:        6.20 cm  3.77 cm/m  RA Area:     25.90 cm LA Vol (A2C):   89.0 ml  54.14 ml/m RA Volume:   81.80 ml  49.76 ml/m LA Vol (A4C):   122.0 ml 74.22 ml/m LA Biplane Vol: 110.0 ml 66.92 ml/m  AORTIC VALVE AV Area (Vmax): 1.12 cm AV Vmax:        180.00 cm/s AV Peak Grad:   13.0 mmHg LVOT Vmax:      71.30 cm/s LVOT Vmean:     44.600 cm/s LVOT VTI:       0.150 m AI PHT:         352 msec  AORTA Ao Root diam: 2.70 cm MITRAL VALVE                TRICUSPID VALVE MV Area (PHT): 3.91 cm     TR Peak grad:   30.0 mmHg MV Peak grad:  25.4 mmHg    TR Vmax:        274.00 cm/s MV Mean grad:  9.0 mmHg MV Vmax:       2.52 m/s     SHUNTS MV Vmean:      132.0 cm/s   Systemic VTI:  0.15 m MV Decel Time: 194 msec     Systemic Diam: 1.90 cm MV E velocity: 205.00 cm/s Marca Ancona MD Electronically signed by Marca Ancona MD Signature Date/Time: 08/26/2020/12:35:27 PM    Final        HISTORY OF PRESENT ILLNESS Karlena Kobi Aller is a 70 y.o. female past medical history of chronic atrial fibrillation, rheumatic heart disease status post mitral valve replacement and tricuspid valve replacement currently on Coumadin supratherapeutic 2 days ago, hypertension,  hyperlipidemia, obesity, presented to the emergency room for sudden onset of headache this afternoon after having had some mild headache for at least 1 to 2 days.  The daughter reports that she had been complaining of some mild headache yesterday but it was sometime this afternoon that her headache become really worse and she was also more drowsy.  She also started complaining of nausea and started vomiting.  She vomited multiple times.  EMS was called.  They brought her in as an abdominal pain-nausea/vomiting patient.  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 Neurology was admitted for further evaluation, and management and admission.The daughter reports that her blood pressures usually are well controlled.  She is independent at baseline.  Lives with family.  Currently retired. Has received [employer] COVID-19 vaccine few months ago.  Not received booster yet.  HOSPITAL COURSE Ms. 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  CT head showed cerebellar vermis small hemorrhage.  MRA showed stable cerebral vermis ICH.  At home she was taking aspirin 81 mg daily and warfarin daily, but antithrombotics were stopped  on admission.  She was on heparin subcutaneous for VTE prophylaxis.  PT / OT followed patient closely and suggested [facility] ([facility]).  Patient complained of mild headache particularly with head movements and received Depakote for it.  Also received Zofran for nausea.  After two repeat CT head which showed stable appearance of midline cerebral hemorrhage, low-dose IV heparin drip 1100 units/hr was started on 08/29/2020.  Close follow-up of her heparin level  and adjustments were made to IV heparin drip accordingly.  On the day of discharge patient's heparin level was 0.30 and IV heparin drip was increased from 700 units/h to 750 units/h.  Patient discharged and readmitted to inpatient rehabilitation on 09/02/2018, with a plan to continue IV heparin drip.  Head CT has been ordered for 09/04/2020 with instruction to call Dr. Leonie Man or Dr. Erlinda Hong after completion of CT scan.  Based on results we will decide whether to continue heparin or restart Coumadin.  Patient has made some progress with physical therapy and Occupational Therapy and plan is to continue inpatient rehabilitation for better mobilization.   DISCHARGE EXAM Blood pressure 115/80, pulse 90, temperature 98.2 F (36.8 C), resp. rate 20, height 4\' 11"  (1.499 m), weight 64.9 kg, SpO2 95 %. General -pleasant middle-age Asian [place] lady sitting comfortably in bed, NAD   Cardiovascular - irregularly irregular heart rate and rhythm.   Psych: Normal mood and affect   Neurological Exam:  Awake, alert and interactive. Oriented to time, place and person.   No dysarthria and no aphasia, able to name and repeat, follows all simple commands.    Visual field full, no gaze palsy, no significant nystagmus.. Facial symmetrical, tongue midline. PERRL.  Moving b/l UEs against gravity symmetrical, moving b/l LEs 3/5 proximal and 5/5 distally. Sensation symmetrical. B/l FTN grossly intact without ataxia. Gait not tested.     Discharge Diet      Diet   Diet Carb Modified Fluid consistency: Thin; Room service appropriate? Yes   liquids  DISCHARGE PLAN Disposition:  Transfer to [place] for ongoing PT, OT and ST heparin IV for secondary stroke prevention, will consider starting Coumadin after CT scan Recommend ongoing stroke risk factor control by Primary Care Physician at time of discharge from inpatient rehabilitation. Follow-up PCP Doristine Mango L, DO in 2 weeks following discharge  from rehab. Follow-up in [facility] in 4 weeks following discharge from rehab, office to schedule an appointment.   Honor Junes, MD PGY 1 Resident  35 minutes were spent preparing discharge.  I have personally obtained history,examined this patient, reviewed notes, independently viewed imaging studies, participated in medical decision making and plan of care.ROS completed by me personally and pertinent positives fully documented  I have made any additions or clarifications directly to the above note. Agree with note above.    Antony Contras, MD Medical Director [facility] Pager: [phone] 09/05/2020 10:07 AM

## 2020-09-02 NOTE — PMR Pre-admission (Addendum)
PMR Admission Coordinator Pre-Admission Assessment  Patient: Deborah [name] is an 70 y.o., female MRN: [id number] DOB: Sep 06, 1950 Height: 4\' 11"  (149.9 cm) Weight: 67 kg              HMO:     PPO:      PCP:      IPA:      80/20: yes      OTHER:  PRIMARY: Medicare Part A and B       Policy#: [id number]      Subscriber: Pt.  CM Name:       Phone#:      Fax#:  Pre-Cert#:  verified 1f:  Benefits:  Phone #:      Name:  Eff. Date: 09/27/2017  A and B     Deduct: $1566 Out of Pocket Max: n/a      Life Max: n/a CIR: 100%      SNF: 20 full days Outpatient: 80%     Co-Pay: 20% Home Health: 100%      Co-Pay:  DME: 80%     Co-Pay: 20% Providers: pt choice   SECONDARY: Medicaid [place] Access     Policy#: 11/25/2017 m      Financial Counselor:       Phone#:   The "Data Collection Information Summary" for patients in Inpatient Rehabilitation Facilities with attached "Privacy Act Statement-Health Care Records" was provided and verbally reviewed with: Patient  Emergency Contact Information Contact Information     Name Relation Home Work Mobile   [place] Daughter [phone]  [phone]   [place],Sal Spouse [phone]     [phone]  [phone]      Current Medical History  Patient Admitting Diagnosis: IPH History of Present Illness: Deborah [name] is a 70 year old right-handed female with history of borderline diabetes mellitus, chronic atrial fibrillation rheumatic heart disease status post mitral valve replacement tricuspid replacement maintained on chronic Coumadin, hypertension, hyperlipidemia, obesity with BMI 30.81. Per chart review patient lives with her family independent prior to admission. 1 level home 3 steps to entry independent and active. She does not drive. Presented 08/24/2020 with headache nausea vomiting decreased mobility x2 days. 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 No downward herniation. CT abdomen pelvis showed large amount of stool no other abnormality identified. Chest x-ray showed marked cardiac enlargement with pulmonary vascular congestion. Admission chemistries hemoglobin 11 blood cultures no growth to date lactic acid 2.9 INR 2.1 troponin negative. 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 Cardiology services continues to follow with cardiac history. Follow-up cranial CT scan 08/31/2020 showed no significant interval change in size of intraparenchymal hemorrhage as compared to prior tracings. Maintained on Depakote for headaches. Blood pressure monitored patient did require Cleviprex. Echocardiogram with ejection fraction of 55 to 60% no wall motion abnormalities. Currently on a regular diet. Due to patient's decreased functional ability therapy evaluations completed and patient was admitted for a comprehensive rehab program.   Complete NIHSS TOTAL: 1 Glasgow Coma Scale Score: 15  Past Medical History  Past Medical History:  Diagnosis Date  . Allergic rhinitis   . Anemia   . Borderline diabetes   . Carpal tunnel syndrome   . Chronic atrial fibrillation (HCC)    coumadin managed by [facility]. CVRR;  Event montor (7/12-8/12) showed no  high rate episodes (patient continuously in atrial fibrillation).   . Chronic diastolic heart failure (HCC)   . Eosinophilia 01/27/2013  . External hemorrhoid   . External hemorrhoids   . Fatigue   . Gout   . 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 Gaylyn Rong  . Hx of cardiovascular stress test    ETT-myoview (7/12): No evidence for ischemia or infarction  . Hyperlipidemia   . Hypertension   . Obesity   . Rheumatic heart disease    Status post mechanical ([employer]. Jude) mitral valve replacement and  tricuspid valve repair at [facility] in 2002; echo 5/12:   EF 60-65%, mild AI, mitral valve prosthesis with AVA 1.66, severe LAE, moderate RAE, moderate to severe TR, mild increased pulmonary artery systolic pressure;    Adenosine Cardiolite in 4/09:   No ischemia, EF 66%  . Rotator cuff syndrome     Family History  family history includes Diabetes in her brother and mother; Gout in her brother; Heart disease in her brother, father, and sister; Heart failure in her mother; Stroke in her brother and mother; Valvular heart disease in her sister.  Prior Rehab/Hospitalizations:  Has the patient had prior rehab or hospitalizations prior to admission? No  Has the patient had major surgery during 100 days prior to admission? No  Current Medications   Current Facility-Administered Medications:  .  acetaminophen (TYLENOL) tablet 650 mg, 650 mg, Oral, Q4H PRN **OR** acetaminophen (TYLENOL) 160 MG/5ML solution 650 mg, 650 mg, Per Tube, Q4H PRN **OR** acetaminophen (TYLENOL) suppository 650 mg, 650 mg, Rectal, Q4H PRN, Milon Dikes, MD .  benzonatate (TESSALON) capsule 100 mg, 100 mg, Oral, TID PRN, Rica Mote, MD .  Chlorhexidine Gluconate Cloth 2 % PADS 6 each, 6 each, Topical, Daily, Milon Dikes, MD, 6 each at 09/01/20 1036 .  divalproex (DEPAKOTE ER) 24 hr tablet 500 mg, 500 mg, Oral, Daily, Sethi, Pramod S, MD .  heparin ADULT infusion 100 units/mL (25000 units/280mL), 700 Units/hr, Intravenous, Continuous, Mosetta Anis, [facility], Last Rate: 7 mL/hr at 09/01/20 0810, 700 Units/hr at 09/01/20 0810 .  insulin aspart (novoLOG) injection 0-15 Units, 0-15 Units, Subcutaneous, TID WC, Micki Riley, MD, 5 Units at 15-Sep-2020 [phone] .  isosorbide mononitrate (IMDUR) 24 hr tablet 30 mg, 30 mg, Oral, Daily, Marvel Plan, MD, 30 mg at 09/01/20 1034 .  labetalol (NORMODYNE) injection 5-20 mg, 5-20 mg, Intravenous, Q10 min PRN, Marvel Plan, MD, 20 mg at 08/27/20 2013 .  meclizine (ANTIVERT) tablet 25 mg, 25  mg, Oral, TID PRN, Marvel Plan, MD, 25 mg at 09-15-20 0749 .  metoprolol succinate (TOPROL-XL) 24 hr tablet 50 mg, 50 mg, Oral, Daily, Marvel Plan, MD, 50 mg at 09/01/20 1034 .  ondansetron (ZOFRAN) injection 4 mg, 4 mg, Intravenous, Q6H PRN, Marvel Plan, MD, 4 mg at 09/01/20 1310 .  pantoprazole (PROTONIX) EC tablet 40 mg, 40 mg, Oral, QHS, Micki Riley, MD, 40 mg at 09/01/20 2154 .  potassium chloride SA (KLOR-CON) CR tablet 40 mEq, 40 mEq, Oral, BID, Rica Mote, MD, 40 mEq at 09/01/20 2154 .  rosuvastatin (CRESTOR) tablet 10 mg, 10 mg, Oral, Daily, Laurey Morale, MD, 10 mg at 09/01/20 1034 .  senna-docusate (Senokot-S) tablet 1 tablet, 1 tablet, Oral, BID, Milon Dikes, MD, 1 tablet at 09/01/20 2154 .  torsemide (DEMADEX) tablet 80 mg, 80 mg, Oral, q morning - 10a, 80 mg at 09/01/20 1034 **AND**  torsemide (DEMADEX) tablet 60 mg, 60 mg, Oral, QHS, Marvel Plan, MD, 60 mg at 09/01/20 2153  Patients Current Diet:  Diet Order             Diet Carb Modified Fluid consistency: Thin; Room service appropriate? Yes  Diet effective now                   Precautions / Restrictions Precautions Precautions: Fall Precaution Comments: dizziness with mobility, used gaze stabilization Restrictions Weight Bearing Restrictions: No   Has the patient had 2 or more falls or a fall with injury in the past year?No  Prior Activity Level Limited Community (1-2x/wk): Went out for appointments  Prior Functional Level Prior Function Level of Independence: Independent Comments: ADLs and IADLs. Enjoys walking for exercises. Does not drive  Self Care: Did the patient need help bathing, dressing, using the toilet or eating?  Independent  Indoor Mobility: Did the patient need assistance with walking from room to room (with or without device)? Independent  Stairs: Did the patient need assistance with internal or external stairs (with or without device)? Independent  Functional Cognition:  Did the patient need help planning regular tasks such as shopping or remembering to take medications? Independent  Home Assistive Devices / Equipment Home Assistive Devices/Equipment: None Home Equipment: None  Prior Device Use: Indicate devices/aids used by the patient prior to current illness, exacerbation or injury? None of the above  Current Functional Level Cognition  Arousal/Alertness:  (awake) Overall Cognitive Status: Within Functional Limits for tasks assessed Orientation Level: Oriented X4 General Comments: pt able to follow commands consistently Attention: Sustained Sustained Attention: Appears intact Memory: Appears intact Awareness: Appears intact Problem Solving: Appears intact Safety/Judgment: Appears intact    Extremity Assessment (includes Sensation/Coordination)  Upper Extremity Assessment: Generalized weakness LUE Deficits / Details: decr grasp 3 out 5/ increased time to release on command  Lower Extremity Assessment: Defer to PT evaluation    ADLs  Overall ADL's : Needs assistance/impaired Eating/Feeding: Moderate assistance,Bed level Eating/Feeding Details (indicate cue type and reason): daughter feeding patient. pt states "ate alot" Grooming: Wash/dry face,Modified independent,Bed level Grooming Details (indicate cue type and reason): blowing nose in bed on arrival . pt noted to remove large clot from nasal passage Upper Body Bathing: Maximal assistance Lower Body Bathing: Total assistance Toilet Transfer: +2 for physical assistance,Moderate assistance Functional mobility during ADLs: +2 for physical assistance,Moderate assistance General ADL Comments: session focused on basic transfer to help with 3n1 transfers and OOB to chair for meals. pt has prolonged bed level care for this stay and needs to progress oob    Mobility  Overal bed mobility: Needs Assistance Bed Mobility: Rolling,Sidelying to Sit,Sit to Sidelying Rolling: Min assist Sidelying to sit:  Mod assist Supine to sit: Mod assist,+2 for physical assistance Sit to supine: Mod assist,+2 for physical assistance Sit to sidelying: Min assist General bed mobility comments: PT attempts to provide instruction on gaze stabilization during mobility, pt with some difficulty following  commnads for eye movement followed by head movement    Transfers  Overall transfer level: Needs assistance Equipment used: Rolling walker (2 wheeled) Transfers: Sit to/from [employer] Sit to Stand: Mod assist,Min assist (modA initially progressing to minA) Stand pivot transfers: Min assist General transfer comment: modA to power up due to pt fear of dizziness and nausa, used gaze stabilization    Ambulation / Gait / Stairs / Wheelchair Mobility  Ambulation/Gait Ambulation/Gait assistance: Editor, commissioning (Feet): 9 Feet Assistive  device: Rolling walker (2 wheeled) Gait Pattern/deviations: Step-to pattern General Gait Details: pt with short step length and slowed gait speed, limited by dizziness during session Gait velocity: reduced Gait velocity interpretation: <1.31 ft/sec, indicative of household ambulator    Posture / Balance Dynamic Sitting Balance Sitting balance - Comments: reliant on UE support of bed Balance Overall balance assessment: Needs assistance Sitting-balance support: Single extremity supported,Bilateral upper extremity supported,Feet supported Sitting balance-Leahy Scale: Poor Sitting balance - Comments: reliant on UE support of bed Standing balance support: Bilateral upper extremity supported Standing balance-Leahy Scale: Poor Standing balance comment: reliant on UE support of RW    Special needs/care consideration Diabetic management  sQ Novolog 0-15  Units 3x daily with meals, and Special service needs Heparin Drip     Previous Home Environment (from acute therapy documentation) Living Arrangements: Children,Spouse/significant other Available Help at  Discharge: Family,Available 24 hours/day Type of Home: House Home Layout: One level Home Access: Stairs to enter Entrance Stairs-Rails: None (front steps) Entrance Stairs-Number of Steps: front 3; back 9 Bathroom Shower/Tub: Chiropodist: Standard Home Care Services: No  Discharge Living Setting Plans for Discharge Living Setting: Patient's home Type of Home at Discharge: House Discharge Home Layout: One level Discharge Home Access: Stairs to enter Entrance Stairs-Rails: None Entrance Stairs-Number of Steps: front 3, back 9 Discharge Bathroom Shower/Tub: Tub/shower unit Discharge Bathroom Toilet: Standard Discharge Bathroom Accessibility: Yes How Accessible: Accessible via walker Does the patient have any problems obtaining your medications?: No  Social/Family/Support Systems Patient Roles: Other (Comment) Contact Information: [phone] Anticipated Caregiver: Cassandria Santee (daughter) Anticipated Caregiver's Contact Information: [phone] Ability/Limitations of Caregiver: Can provide Mod A Caregiver Availability: 24/7 Discharge Plan Discussed with Primary Caregiver: Yes Is Caregiver In Agreement with Plan?: Yes Does Caregiver/Family have Issues with Lodging/Transportation while Pt is in Rehab?: No   Goals Patient/Family Goal for Rehab: PT/SLP Supervision, OT min A/Supervision Expected length of stay: 14-18 days Pt/Family Agrees to Admission and willing to participate: Yes Program Orientation Provided & Reviewed with Pt/Caregiver Including Roles  & Responsibilities: Yes   Decrease burden of Care through IP rehab admission: Specialzed equipment needs, Decrease number of caregivers and Patient/family education   Possible need for SNF placement upon discharge: Not anticipated   Patient Condition: This patient's medical and functional status has changed since the consult dated: 08/29/2020 in which the Rehabilitation Physician determined and documented  that the patient's condition is appropriate for intensive rehabilitative care in an inpatient rehabilitation facility. See "History of Present Illness" (above) for medical update. Functional changes are: Pt. Walking short distances (9 ft) min A. Patient's medical and functional status update has been discussed with the Rehabilitation physician and patient remains appropriate for inpatient rehabilitation. Will admit to inpatient rehab today.  Preadmission Screen Completed By:  Genella Mech, CCC-SLP, 2020-09-27 8:43 AM ______________________________________________________________________   Discussed status with Dr. Posey Pronto on September 27, 2020 at 10:38 and received approval for admission today.  Admission Coordinator:  Genella Mech, time 10:35 Sudie Grumbling 09-27-20

## 2020-09-02 NOTE — Progress Notes (Signed)
PMR Admission Coordinator Pre-Admission Assessment  Patient: Deborah [name] is an 69 y.o., female MRN: [id number] DOB: 01/02/1951 Height: 4' 11" (149.9 cm) Weight: 67 kg              HMO:     PPO:      PCP:      IPA:      80/20: yes      OTHER:  PRIMARY: Medicare Part A and B       Policy#: [id number]      Subscriber: Pt.  CM Name:       Phone#:      Fax#:  Pre-Cert#:  verified online      Employer:  Benefits:  Phone #:      Name:  Eff. Date: 09/27/2017  A and B     Deduct: $1566 Out of Pocket Max: n/a      Life Max: n/a CIR: 100%      SNF: 20 full days Outpatient: 80%     Co-Pay: 20% Home Health: 100%      Co-Pay:  DME: 80%     Co-Pay: 20% Providers: pt choice   SECONDARY: Medicaid  Access     Policy#: [id number]      Financial Counselor:       Phone#:   The "Data Collection Information Summary" for patients in Inpatient Rehabilitation Facilities with attached "Privacy Act Statement-Health Care Records" was provided and verbally reviewed with: Patient  Emergency Contact Information Contact Information     Name Relation Home Work Mobile   Hole,Naitina Daughter [phone]  [phone]   Hotlieng,Sal Spouse [phone]     Hole,Daniel Son [phone]  [phone]      Current Medical History  Patient Admitting Diagnosis: IPH History of Present Illness: Deborah [name] is a 69-year-old right-handed female with history of borderline diabetes mellitus, chronic atrial fibrillation rheumatic heart disease status post mitral valve replacement tricuspid replacement maintained on chronic Coumadin, hypertension, hyperlipidemia, obesity with BMI 30.81. Per chart review patient lives with her family independent prior to admission. 1 level home 3 steps to entry independent and active. She does not drive. Presented 08/24/2020 with headache nausea vomiting decreased mobility x2 days. 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 No downward herniation. CT abdomen pelvis showed large amount of stool no other abnormality identified. Chest x-ray showed marked cardiac enlargement with pulmonary vascular congestion. Admission chemistries hemoglobin 11 blood cultures no growth to date lactic acid 2.9 INR 2.1 troponin negative. 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 Cardiology services continues to follow with cardiac history. Follow-up cranial CT scan 08/31/2020 showed no significant interval change in size of intraparenchymal hemorrhage as compared to prior tracings. Maintained on Depakote for headaches. Blood pressure monitored patient did require Cleviprex. Echocardiogram with ejection fraction of 55 to 60% no wall motion abnormalities. Currently on a regular diet. Due to patient's decreased functional ability therapy evaluations completed and patient was admitted for a comprehensive rehab program.   Complete NIHSS TOTAL: 1 Glasgow Coma Scale Score: 15  Past Medical History  Past Medical History:  Diagnosis Date  . Allergic rhinitis   . Anemia   . Borderline diabetes   . Carpal tunnel syndrome   . Chronic atrial fibrillation (HCC)    coumadin managed by [facility] CVRR;  Event montor (7/12-8/12) showed no   high rate episodes (patient continuously in atrial fibrillation).   . Chronic diastolic heart failure (HCC)   . Eosinophilia 01/27/2013  . External hemorrhoid   . External hemorrhoids   . Fatigue   . Gout   . 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 Ha  . Hx of cardiovascular stress test    ETT-myoview (7/12): No evidence for ischemia or infarction  . Hyperlipidemia   . Hypertension   . Obesity   . Rheumatic heart disease    Status post mechanical (St. Jude) mitral valve replacement and  tricuspid valve repair at [facility] in 2002; echo 5/12:   EF 60-65%, mild AI, mitral valve prosthesis with AVA 1.66, severe LAE, moderate RAE, moderate to severe TR, mild increased pulmonary artery systolic pressure;    Adenosine Cardiolite in 4/09:   No ischemia, EF 66%  . Rotator cuff syndrome     Family History  family history includes Diabetes in her brother and mother; Gout in her brother; Heart disease in her brother, father, and sister; Heart failure in her mother; Stroke in her brother and mother; Valvular heart disease in her sister.  Prior Rehab/Hospitalizations:  Has the patient had prior rehab or hospitalizations prior to admission? No  Has the patient had major surgery during 100 days prior to admission? No  Current Medications   Current Facility-Administered Medications:  .  acetaminophen (TYLENOL) tablet 650 mg, 650 mg, Oral, Q4H PRN **OR** acetaminophen (TYLENOL) 160 MG/5ML solution 650 mg, 650 mg, Per Tube, Q4H PRN **OR** acetaminophen (TYLENOL) suppository 650 mg, 650 mg, Rectal, Q4H PRN, Arora, Ashish, MD .  benzonatate (TESSALON) capsule 100 mg, 100 mg, Oral, TID PRN, Collins, Hunter J, MD .  Chlorhexidine Gluconate Cloth 2 % PADS 6 each, 6 each, Topical, Daily, Arora, Ashish, MD, 6 each at 09/01/20 1036 .  divalproex (DEPAKOTE ER) 24 hr tablet 500 mg, 500 mg, Oral, Daily, Sethi, Pramod S, MD .  heparin ADULT infusion 100 units/mL (25000 units/250mL), 700 Units/hr, Intravenous, Continuous, Bitonti, Michael T, RPH, Last Rate: 7 mL/hr at 09/01/20 0810, 700 Units/hr at 09/01/20 0810 .  insulin aspart (novoLOG) injection 0-15 Units, 0-15 Units, Subcutaneous, TID WC, Sethi, Pramod S, MD, 5 Units at 09/07/2020 0748 .  isosorbide mononitrate (IMDUR) 24 hr tablet 30 mg, 30 mg, Oral, Daily, Xu, Jindong, MD, 30 mg at 09/01/20 1034 .  labetalol (NORMODYNE) injection 5-20 mg, 5-20 mg, Intravenous, Q10 min PRN, Xu, Jindong, MD, 20 mg at 08/27/20 2013 .  meclizine (ANTIVERT) tablet 25 mg, 25  mg, Oral, TID PRN, Xu, Jindong, MD, 25 mg at 08/28/2020 0749 .  metoprolol succinate (TOPROL-XL) 24 hr tablet 50 mg, 50 mg, Oral, Daily, Xu, Jindong, MD, 50 mg at 09/01/20 1034 .  ondansetron (ZOFRAN) injection 4 mg, 4 mg, Intravenous, Q6H PRN, Xu, Jindong, MD, 4 mg at 09/01/20 1310 .  pantoprazole (PROTONIX) EC tablet 40 mg, 40 mg, Oral, QHS, Sethi, Pramod S, MD, 40 mg at 09/01/20 2154 .  potassium chloride SA (KLOR-CON) CR tablet 40 mEq, 40 mEq, Oral, BID, Collins, Hunter J, MD, 40 mEq at 09/01/20 2154 .  rosuvastatin (CRESTOR) tablet 10 mg, 10 mg, Oral, Daily, McLean, Dalton S, MD, 10 mg at 09/01/20 1034 .  senna-docusate (Senokot-S) tablet 1 tablet, 1 tablet, Oral, BID, Arora, Ashish, MD, 1 tablet at 09/01/20 2154 .  torsemide (DEMADEX) tablet 80 mg, 80 mg, Oral, q morning - 10a, 80 mg at 09/01/20 1034 **AND**   torsemide (DEMADEX) tablet 60 mg, 60 mg, Oral, QHS, Xu, Jindong, MD, 60 mg at 09/01/20 2153  Patients Current Diet:  Diet Order             Diet Carb Modified Fluid consistency: Thin; Room service appropriate? Yes  Diet effective now                   Precautions / Restrictions Precautions Precautions: Fall Precaution Comments: dizziness with mobility, used gaze stabilization Restrictions Weight Bearing Restrictions: No   Has the patient had 2 or more falls or a fall with injury in the past year?No  Prior Activity Level Limited Community (1-2x/wk): Went out for appointments  Prior Functional Level Prior Function Level of Independence: Independent Comments: ADLs and IADLs. Enjoys walking for exercises. Does not drive  Self Care: Did the patient need help bathing, dressing, using the toilet or eating?  Independent  Indoor Mobility: Did the patient need assistance with walking from room to room (with or without device)? Independent  Stairs: Did the patient need assistance with internal or external stairs (with or without device)? Independent  Functional Cognition:  Did the patient need help planning regular tasks such as shopping or remembering to take medications? Independent  Home Assistive Devices / Equipment Home Assistive Devices/Equipment: None Home Equipment: None  Prior Device Use: Indicate devices/aids used by the patient prior to current illness, exacerbation or injury? None of the above  Current Functional Level Cognition  Arousal/Alertness:  (awake) Overall Cognitive Status: Within Functional Limits for tasks assessed Orientation Level: Oriented X4 General Comments: pt able to follow commands consistently Attention: Sustained Sustained Attention: Appears intact Memory: Appears intact Awareness: Appears intact Problem Solving: Appears intact Safety/Judgment: Appears intact    Extremity Assessment (includes Sensation/Coordination)  Upper Extremity Assessment: Generalized weakness LUE Deficits / Details: decr grasp 3 out 5/ increased time to release on command  Lower Extremity Assessment: Defer to PT evaluation    ADLs  Overall ADL's : Needs assistance/impaired Eating/Feeding: Moderate assistance,Bed level Eating/Feeding Details (indicate cue type and reason): daughter feeding patient. pt states "ate alot" Grooming: Wash/dry face,Modified independent,Bed level Grooming Details (indicate cue type and reason): blowing nose in bed on arrival . pt noted to remove large clot from nasal passage Upper Body Bathing: Maximal assistance Lower Body Bathing: Total assistance Toilet Transfer: +2 for physical assistance,Moderate assistance Functional mobility during ADLs: +2 for physical assistance,Moderate assistance General ADL Comments: session focused on basic transfer to help with 3n1 transfers and OOB to chair for meals. pt has prolonged bed level care for this stay and needs to progress oob    Mobility  Overal bed mobility: Needs Assistance Bed Mobility: Rolling,Sidelying to Sit,Sit to Sidelying Rolling: Min assist Sidelying to sit:  Mod assist Supine to sit: Mod assist,+2 for physical assistance Sit to supine: Mod assist,+2 for physical assistance Sit to sidelying: Min assist General bed mobility comments: PT attempts to provide instruction on gaze stabilization during mobility, pt with some difficulty following  commnads for eye movement followed by head movement    Transfers  Overall transfer level: Needs assistance Equipment used: Rolling walker (2 wheeled) Transfers: Sit to/from Stand,Stand Pivot Transfers Sit to Stand: Mod assist,Min assist (modA initially progressing to minA) Stand pivot transfers: Min assist General transfer comment: modA to power up due to pt fear of dizziness and nausa, used gaze stabilization    Ambulation / Gait / Stairs / Wheelchair Mobility  Ambulation/Gait Ambulation/Gait assistance: Min assist Gait Distance (Feet): 9 Feet Assistive   device: Rolling walker (2 wheeled) Gait Pattern/deviations: Step-to pattern General Gait Details: pt with short step length and slowed gait speed, limited by dizziness during session Gait velocity: reduced Gait velocity interpretation: <1.31 ft/sec, indicative of household ambulator    Posture / Balance Dynamic Sitting Balance Sitting balance - Comments: reliant on UE support of bed Balance Overall balance assessment: Needs assistance Sitting-balance support: Single extremity supported,Bilateral upper extremity supported,Feet supported Sitting balance-Leahy Scale: Poor Sitting balance - Comments: reliant on UE support of bed Standing balance support: Bilateral upper extremity supported Standing balance-Leahy Scale: Poor Standing balance comment: reliant on UE support of RW    Special needs/care consideration Diabetic management  sQ Novolog 0-15  Units 3x daily with meals, and Special service needs Heparin Drip     Previous Home Environment (from acute therapy documentation) Living Arrangements: Children,Spouse/significant other Available Help at  Discharge: Family,Available 24 hours/day Type of Home: House Home Layout: One level Home Access: Stairs to enter Entrance Stairs-Rails: None (front steps) Entrance Stairs-Number of Steps: front 3; back 9 Bathroom Shower/Tub: Tub/shower unit Bathroom Toilet: Standard Home Care Services: No  Discharge Living Setting Plans for Discharge Living Setting: Patient's home Type of Home at Discharge: House Discharge Home Layout: One level Discharge Home Access: Stairs to enter Entrance Stairs-Rails: None Entrance Stairs-Number of Steps: front 3, back 9 Discharge Bathroom Shower/Tub: Tub/shower unit Discharge Bathroom Toilet: Standard Discharge Bathroom Accessibility: Yes How Accessible: Accessible via walker Does the patient have any problems obtaining your medications?: No  Social/Family/Support Systems Patient Roles: Other (Comment) Contact Information: [phone] Anticipated Caregiver: Naitina Hole (daughter) Anticipated Caregiver's Contact Information: [phone] Ability/Limitations of Caregiver: Can provide Mod A Caregiver Availability: 24/7 Discharge Plan Discussed with Primary Caregiver: Yes Is Caregiver In Agreement with Plan?: Yes Does Caregiver/Family have Issues with Lodging/Transportation while Pt is in Rehab?: No   Goals Patient/Family Goal for Rehab: PT/SLP Supervision, OT min A/Supervision Expected length of stay: 14-18 days Pt/Family Agrees to Admission and willing to participate: Yes Program Orientation Provided & Reviewed with Pt/Caregiver Including Roles  & Responsibilities: Yes   Decrease burden of Care through IP rehab admission: Specialzed equipment needs, Decrease number of caregivers and Patient/family education   Possible need for SNF placement upon discharge: Not anticipated   Patient Condition: This patient's medical and functional status has changed since the consult dated: 08/29/2020 in which the Rehabilitation Physician determined and documented  that the patient's condition is appropriate for intensive rehabilitative care in an inpatient rehabilitation facility. See "History of Present Illness" (above) for medical update. Functional changes are: Pt. Walking short distances (9 ft) min A. Patient's medical and functional status update has been discussed with the Rehabilitation physician and patient remains appropriate for inpatient rehabilitation. Will admit to inpatient rehab today.  Preadmission Screen Completed By:  Carlos Quackenbush B Clydean Posas, CCC-SLP, 09/26/2020 8:43 AM ______________________________________________________________________   Discussed status with Dr. Patel on 09/13/2020 at 10:38 and received approval for admission today.  Admission Coordinator:  Bernardine Langworthy B Latasia Silberstein, time 10:35 /Date 09/12/2020      for  SNF placement upon discharge: Not anticipated     Patient Condition: This patient's medical and functional status has changed since the consult dated: 08/29/2020 in which the Rehabilitation Physician determined and documented that the patient's condition is appropriate for intensive rehabilitative care in an inpatient rehabilitation facility. See "History of Present Illness" (above) for medical update. Functional changes are: Pt. Walking short distances (9 ft) min A. Patient's medical and functional status update has been discussed with the Rehabilitation physician and patient remains appropriate for inpatient rehabilitation. Will admit to inpatient rehab today.   Preadmission Screen Completed By:  Zayvion Stailey B Koree Schopf, CCC-SLP, 08/31/2020 8:43 AM ______________________________________________________________________   Discussed status with Dr. Patel on 09/24/2020 at 10:38 and received approval for admission today.   Admission Coordinator:  Salar Molden B Marti Mclane, time 10:35 /Date 09/25/2020     

## 2020-09-02 NOTE — Progress Notes (Addendum)
STROKE TEAM PROGRESS NOTE   INTERVAL HISTORY No acute overnight events. Patient evaluated at bedside this morning with daughter present in the room. She states she feels better this morning. Denies nausea, dizziness or vomiting. Notes was able to tolerate physical therapy better. Informed [facility] and patient and pt's daughter feels comfortable transitioning today. Her Heparin level is 0.30 at IV heparin drip 700 units/hr. Increasing Heparin level to 750 units/hr. Patient's sCr~ 1.67 increasing, likely in setting of poor intake. Recieveing IVF to help with kidney functions. Also, CT head is ordered for 09/05/19 with an instruction to follow with stroke team as on IV heparin drip and may consider transitioning to Coumadin. Blood pressure well controlled. Neurological exam unchanged.    Vitals:   09/01/20 2334 Sep 09, 2020 0326 September 09, 2020 0755 09-Sep-2020 1210  BP: 117/82 104/86 123/90 130/82  Pulse: 65 60 66 77  Resp: 18 18 16 18   Temp: 98.1 F (36.7 C) 98 F (36.7 C) 98.6 F (37 C) 97.8 F (36.6 C)  TempSrc: Oral Oral Oral Oral  SpO2: 95% 97% 97% 96%  Weight:      Height:       CBC:  Recent Labs  Lab 08/30/20 0405 09/01/20 0239  WBC 8.5 9.1  HGB 13.3 13.6  HCT 40.2 43.1  MCV 74.0* 73.4*  PLT 203 229   Basic Metabolic Panel:  Recent Labs  Lab 09/01/20 0239 09/09/20 0131  NA 134* 134*  K 4.0 4.8  CL 96* 99  CO2 24 23  GLUCOSE 179* 193*  BUN 42* 44*  CREATININE 1.40* 1.67*  CALCIUM 11.0* 10.8*    IMAGING past 24 hours  CT head wo contrast  09/01/19 IMPRESSION: 1. No significant interval change in size of intraparenchymal hemorrhage centered at the cerebellar vermis. Similar surrounding mild edema without significant regional mass effect. 2. No other new acute intracranial abnormality.    DG CHEST PORT 1 VIEW 08/26/2020 IMPRESSION:   1. Marked cardiac enlargement with pulmonary vascular congestion.  2. Nodular opacity in the LEFT  upper chest versus vessel on end. Follow-up PA and lateral chest is suggested to ensure resolution and or CT of the chest on follow-up after resolution of acute process.  3. No lobar consolidative changes. No overt pulmonary edema.  4. Question of LEFT lower lobe atelectasis, attention on follow-up.   ECHOCARDIOGRAM COMPLETE 08/26/2020  IMPRESSIONS   1. Left ventricular ejection fraction, by estimation, is 55 to 60%. The left ventricle has normal function. The left ventricle has no regional wall motion abnormalities. There is mild left ventricular hypertrophy. Left ventricular diastolic parameters are indeterminate.   2. Right ventricular systolic function is mildly reduced. The right ventricular size is normal. There is moderately elevated pulmonary artery systolic pressure. The estimated right ventricular systolic pressure is 79.8 mmHg.   3. Left atrial size was severely dilated.   4. Right atrial size was severely dilated.   5. 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 Mean gradient 9 mmHg but PHT 76 msec (short). Suspect that she does not have hemodynamically significant mitral mitral stenosis and there does not appear to be significant mitral regurgitation.   6. Status post tricuspid valve repair. Mean gradient 6 mmHg with mild-moderate tricuspid regurgitation.   7. The aortic valve is tricuspid. Aortic valve regurgitation is mild. Mild aortic valve sclerosis is present, with no evidence of aortic valve stenosis.   8. The inferior vena cava is dilated in size with <50% respiratory variability, suggesting  right atrial pressure of 15 mmHg.   9. The patient is in atrial fibrillation.   PHYSICAL EXAM   Temp:  [97.7 F (36.5 C)-98.6 F (37 C)] 98.2 F (36.8 C) (01/07 1315) Pulse Rate:  [50-90] 90 (01/07 1315) Resp:  [16-20] 20 (01/07 1315) BP: (104-130)/(71-90) 115/80 (01/07 1315) SpO2:  [95 %-97 %] 95 % (01/07 1315) Weight:  [64.9 kg] 64.9 kg (01/07 1315)  General -pleasant middle-age Asian  [place] lady sitting comfortably in bed, NAD  Cardiovascular - irregularly irregular heart rate and rhythm.  Psych: Normal mood and affect  Neurological Exam:  Awake, alert and interactive. Oriented to time, place and person.   No dysarthria and no aphasia, able to name and repeat, follows all simple commands.    Visual field full, no gaze palsy, no significant nystagmus.. Facial symmetrical, tongue midline. PERRL.  Moving b/l UEs against gravity symmetrical, moving b/l LEs 3/5 proximal and 5/5 distally. Sensation symmetrical. B/l FTN grossly intact without ataxia. Gait not tested.   ASSESSMENT/PLAN Ms. Malva Diesing is a 70 y.o. 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 CT scan shows cerebellar vermis hemorrhage  likely due to warfarin coagulopathy with supratherapeutic INR  ICH:           MRI:  Stable cerebellar vermis ICH CT head: cerebellar vermis small hemorrhage CTA head & neck unremarkable Repeat CT head x 2 stable hemorrhage and mass effect CT head: No significant interval change in size of intraparenchymal hemorrhage centered at the cerebellar vermis. Similar surrounding mild edema without significant regional mass effect. 2D Echo - EF 55-60%. No source of embolus. LA severely dilated ; mechanical mitral valve ; tricuspid valve repair ; patient was in atrial fibrillation.  LDL 71 HgbA1c 7.5 VTE prophylaxis - None aspirin 81 mg daily and warfarin daily prior to admission, now on IV heparin drip Therapy recommendations:  CIR recommended- have bed availability today, being transferred. Disposition:  CIR, today  Chronic afib Mechanical MV TV repair  On warfarin at home INR 4.3->2.1->kcentra and vitK->1.1 Increase IV Heparin drip at 750 units/hr  and f/u of Heparin Level after 8 hrs and then daily along with CBC and any signs of  bleeding. Pt does have high risk of embolic event with mechanical valve at this time - discussed with daughter and son (phone) CXR - 08/26/20 - Marked cardiac enlargement with pulmonary vascular congestion.  Demadex PTA - 80 mg Q AM - 60 mg QPM -> resumed 08/26/20 (cardiologist - Laurey Morale, MD - [facility]) Potassium 3.5->3.8>4.3>4.0>4.8  Hypertension Home meds:  Imdur, metoprolol, torsemide Stable Off cleviprex now Continue imdur and metoprolol Continue torsemide and potassium  Long-term BP goal normotensive Labetalol PRN  Hyperlipidemia Home meds: Crestor 10 LDL 71, goal < 70 C/w Crestor 10 mg   Diabetes type II Uncontrolled Home meds:  metformin HgbA1c 7.5, goal < 7.0 CBGs  SSI changed from sensitive to moderate for better control. PCP close follow up   Other Stroke Risk Factors Advanced Age >/= 84  Obesity, Body mass index is 29.83 kg/m., BMI >/= 30 associated with increased stroke risk, recommend weight loss, diet and exercise as appropriate   Other Active Problems, Findings and Recommendations HA, nausea and vomiting secondary to ICH. Sx management CKD IIIa, Cre 1.45->1.30->1.29->1.15->1.15>1.17>1.29>1.40>1.67 Nodular opacity in the LEFT upper chest versus vessel on end. Follow-up PA and lateral  chest is suggested to ensure resolution and or CT of the chest on follow-up after resolution of acute process. Repeat CXR  08/28/20 - Stable moderate cardiomegaly without pulmonary edema. No active pulmonary disease. Consider Chest CT as per radiology and note from Dr Thomasena Edis regarding conversation with pt's daughter. Consider cardiology consult. Pt sees Dr [name] - [facility].  Arnoldo Lenis, MD PGY-1 Resident  Attending Note :  Patient neurological exam remains stable.  IV heparin drip remains therapeutic this morning  Pharmacy is closely monitoring follow up heparin levels will plan to to keep it in the low therapeutic range.  Plan restart warfarin after 2 weeks  of hemorrhage and not continue aspirin long-term. Continue ongoing therapies and hopefully transfer to inpatient rehab later this week.  Long discussion of the bedside with the patient and daughter and answered questions.     Delia Heady, MD Medical Director [facility] Pager: [phone] 09/17/2020 2:22 PM   To contact Stroke Continuity provider, please refer to [email or web address]. After hours, contact General Neurology

## 2020-09-02 NOTE — Plan of Care (Signed)
Problem: Education: Goal: Knowledge of General Education information will improve Description: Including pain rating scale, medication(s)/side effects and non-pharmacologic comfort measures 09/05/2020 1247 by Wylie Hail, RN Outcome: Adequate for Discharge 08/27/2020 1247 by Wylie Hail, RN Outcome: Adequate for Discharge   Problem: Health Behavior/Discharge Planning: Goal: Ability to manage health-related needs will improve 09/20/2020 1247 by Wylie Hail, RN Outcome: Adequate for Discharge 09/20/2020 1247 by Wylie Hail, RN Outcome: Adequate for Discharge   Problem: Clinical Measurements: Goal: Ability to maintain clinical measurements within normal limits will improve 09/03/2020 1247 by Wylie Hail, RN Outcome: Adequate for Discharge 09/14/2020 1247 by Wylie Hail, RN Outcome: Adequate for Discharge Goal: Will remain free from infection 09/12/2020 1247 by Wylie Hail, RN Outcome: Adequate for Discharge 09/05/2020 1247 by Wylie Hail, RN Outcome: Adequate for Discharge Goal: Diagnostic test results will improve 09/05/2020 1247 by Wylie Hail, RN Outcome: Adequate for Discharge 09/24/2020 1247 by Wylie Hail, RN Outcome: Adequate for Discharge Goal: Respiratory complications will improve 09/01/2020 1247 by Wylie Hail, RN Outcome: Adequate for Discharge 09/24/2020 1247 by Wylie Hail, RN Outcome: Adequate for Discharge Goal: Cardiovascular complication will be avoided 09/11/2020 1247 by Wylie Hail, RN Outcome: Adequate for Discharge 09/22/2020 1247 by Wylie Hail, RN Outcome: Adequate for Discharge   Problem: Activity: Goal: Risk for activity intolerance will decrease 09/10/2020 1247 by Wylie Hail, RN Outcome: Adequate for Discharge 09/15/2020 1247 by Wylie Hail, RN Outcome: Adequate for Discharge   Problem: Nutrition: Goal: Adequate nutrition will be maintained 09/24/2020 1247 by Wylie Hail, RN Outcome: Adequate for Discharge 09/26/2020 1247  by Wylie Hail, RN Outcome: Adequate for Discharge   Problem: Coping: Goal: Level of anxiety will decrease 09/23/2020 1247 by Wylie Hail, RN Outcome: Adequate for Discharge 09/08/2020 1247 by Wylie Hail, RN Outcome: Adequate for Discharge   Problem: Elimination: Goal: Will not experience complications related to bowel motility 09/26/2020 1247 by Wylie Hail, RN Outcome: Adequate for Discharge 09/23/2020 1247 by Wylie Hail, RN Outcome: Adequate for Discharge Goal: Will not experience complications related to urinary retention 08/30/2020 1247 by Wylie Hail, RN Outcome: Adequate for Discharge 09/07/2020 1247 by Wylie Hail, RN Outcome: Adequate for Discharge   Problem: Pain Managment: Goal: General experience of comfort will improve 08/29/2020 1247 by Wylie Hail, RN Outcome: Adequate for Discharge 09/10/2020 1247 by Wylie Hail, RN Outcome: Adequate for Discharge   Problem: Safety: Goal: Ability to remain free from injury will improve 09/07/2020 1247 by Wylie Hail, RN Outcome: Adequate for Discharge 09/25/2020 1247 by Wylie Hail, RN Outcome: Adequate for Discharge   Problem: Skin Integrity: Goal: Risk for impaired skin integrity will decrease 08/29/2020 1247 by Wylie Hail, RN Outcome: Adequate for Discharge 09/05/2020 1247 by Wylie Hail, RN Outcome: Adequate for Discharge   Problem: Education: Goal: Knowledge of disease or condition will improve 08/28/2020 1247 by Wylie Hail, RN Outcome: Adequate for Discharge 09/05/2020 1247 by Wylie Hail, RN Outcome: Adequate for Discharge Goal: Knowledge of secondary prevention will improve 09/21/2020 1247 by Wylie Hail, RN Outcome: Adequate for Discharge 08/28/2020 1247 by Wylie Hail, RN Outcome: Adequate for Discharge Goal: Knowledge of patient specific risk factors addressed and post discharge goals established will improve 09/23/2020 1247 by Wylie Hail, RN Outcome: Adequate for  Discharge 08/28/2020 1247 by Wylie Hail, RN Outcome: Adequate for Discharge Goal: Individualized Educational  Video(s) 08/28/2020 1247 by Wylie Hail, RN Outcome: Adequate for Discharge 09/23/2020 1247 by Wylie Hail, RN Outcome: Adequate for Discharge   Problem: Coping: Goal: Will identify appropriate support needs 09/26/2020 1247 by Wylie Hail, RN Outcome: Adequate for Discharge 09/21/2020 1247 by Wylie Hail, RN Outcome: Adequate for Discharge   Problem: Health Behavior/Discharge Planning: Goal: Ability to manage health-related needs will improve 09/21/2020 1247 by Wylie Hail, RN Outcome: Adequate for Discharge 09/21/2020 1247 by Wylie Hail, RN Outcome: Adequate for Discharge   Problem: Self-Care: Goal: Ability to participate in self-care as condition permits will improve 09/17/2020 1247 by Wylie Hail, RN Outcome: Adequate for Discharge 09/14/2020 1247 by Wylie Hail, RN Outcome: Adequate for Discharge Goal: Verbalization of feelings and concerns over difficulty with self-care will improve 09/12/2020 1247 by Wylie Hail, RN Outcome: Adequate for Discharge 09/05/2020 1247 by Wylie Hail, RN Outcome: Adequate for Discharge Goal: Ability to communicate needs accurately will improve 09/03/2020 1247 by Wylie Hail, RN Outcome: Adequate for Discharge 09/14/2020 1247 by Wylie Hail, RN Outcome: Adequate for Discharge   Problem: Nutrition: Goal: Risk of aspiration will decrease 08/31/2020 1247 by Wylie Hail, RN Outcome: Adequate for Discharge 08/30/2020 1247 by Wylie Hail, RN Outcome: Adequate for Discharge Goal: Dietary intake will improve 09/01/2020 1247 by Wylie Hail, RN Outcome: Adequate for Discharge 09/20/2020 1247 by Wylie Hail, RN Outcome: Adequate for Discharge   Problem: Intracerebral Hemorrhage Tissue Perfusion: Goal: Complications of Intracerebral Hemorrhage will be minimized 08/29/2020 1247 by Wylie Hail, RN Outcome: Adequate  for Discharge 09/22/2020 1247 by Wylie Hail, RN Outcome: Adequate for Discharge

## 2020-09-02 NOTE — Progress Notes (Signed)
Pt admitted to room 4M13. Denies pain or discomfort. Oriented to floor and rehab policy. Currently resting in bed with all needs within reach.   Marylu Lund, RN

## 2020-09-02 NOTE — Progress Notes (Signed)
ANTICOAGULATION CONSULT NOTE  Pharmacy Consult for heparin Indication: Mechanical mitral valve  Allergies  Allergen Reactions  . Promethazine Hcl Other (See Comments)    REACTION: made her real shaky    Patient Measurements: Height: 4\' 11"  (149.9 cm) Weight: 67 kg (147 lb 11.3 oz) IBW/kg (Calculated) : 43.2 Heparin Dosing Weight: 69.2 kg   Vital Signs: Temp: 98.6 F (37 C) (01/07 0755) Temp Source: Oral (01/07 0755) BP: 123/90 (01/07 0755) Pulse Rate: 66 (01/07 0755)  Labs: Recent Labs    08/31/20 0248 08/31/20 1049 08/31/20 1244 09/01/20 0239 08/28/2020 0131  HGB  --   --   --  13.6  --   HCT  --   --   --  43.1  --   PLT  --   --   --  192  --   LABPROT 12.7  --   --   --   --   INR 1.0  --   --   --   --   HEPARINUNFRC 0.31  --  0.32 0.32 0.30  CREATININE  --  1.34*  --  1.40* 1.67*    Estimated Creatinine Clearance: 27.2 mL/min (A) (by C-G formula based on SCr of 1.67 mg/dL (H)).  Assessment: 15 YOF on warfarin at home for h/o mechanical mitral valve presented to the ED on 12/29 with cerebellar vermis hemorrhage. Warfarin was reversed with 79F-PCC and vitamin K and anticoagulation has been on hold since. Pharmacy consulted to resume IV heparin per stroke protocol.  Heparin level is therapeutic on the low end goal range at 0.3 on 700 units/hr - will increase rate to keep >0.3. Plan is to resume warfarin after 2 wks from hemorrhage. No CBC today, no bleeding noted.   Goal of Therapy:  Heparin level 0.3-0.5 units/ml Monitor platelets by anticoagulation protocol: Yes   Plan:  -Increase IV heparin to 750 units/hr -Daily heparin level, CBC -Monitor for s/sx of bleeding  Thank you for involving pharmacy in this patient's care.  1/30, PharmD, BCPS Clinical Pharmacist Clinical phone for 09/22/2020 until 3p is [phone] 09/24/2020 8:46 AM  **Pharmacist phone directory can be found on [email or web address] listed under [facility]**

## 2020-09-02 NOTE — IPOC Note (Signed)
Individualized overall Plan of Care [facility]) Patient Details Name: Deborah [name] MRN: [id number] DOB: November 18, 1950  Admitting Diagnosis: Cerebellar stroke [facility])  Hospital Problems: Principal Problem:   Cerebellar stroke [facility]) Active Problems:   Intraparenchymal hemorrhage of brain (HCC)   Sleep disturbance   AKI (acute kidney injury) (HCC)   Benign essential HTN     Functional Problem List: Nursing Bladder,Endurance,Motor,Safety  PT Balance,Perception,Behavior,Safety,Edema,Sensory,Endurance,Skin Integrity,Motor,Nutrition,Pain  OT Balance,Cognition,Endurance,Motor,Pain,Vision  SLP    TR         Basic ADL's: OT Grooming,Bathing,Dressing,Toileting     Advanced  ADL's: OT       Transfers: PT Bed Mobility,Bed to Chair,Car,Furniture,Floor  OT Toilet,Tub/Shower     Locomotion: PT Ambulation,Stairs     Additional Impairments: OT    SLP        TR      Anticipated Outcomes Item Anticipated Outcome  Self Feeding S  Swallowing      Basic self-care  S  Toileting  S   Bathroom Transfers S  Bowel/Bladder  manage bowel and bladder with mod I assist  Transfers  supervision using LRAD  Locomotion  supervision using LRAD  Communication     Cognition     Pain  remain free of pain  Safety/Judgment  remain free of injury, prevent falls with cues and reminders   Therapy Plan: PT Intensity: Minimum of 1-2 x/day ,45 to 90 minutes PT Frequency: 5 out of 7 days PT Duration Estimated Length of Stay: ~ 2 weeks OT Intensity: Minimum of 1-2 x/day, 45 to 90 minutes OT Frequency: 5 out of 7 days OT Duration/Estimated Length of Stay: 10-14      Team Interventions: Nursing Interventions Patient/Family Education,Bladder Management,Skin Care/Wound Management,Discharge Planning,Medication Management,Disease Management/Prevention,Psychosocial Support  PT interventions Ambulation/gait training,Community Teaching laboratory technician  re-education,Psychosocial support,Stair training,UE/LE Strength taining/ROM,Balance/vestibular training,Discharge planning,Functional electrical stimulation,Pain management,Skin care/wound management,UE/LE Coordination activities,Therapeutic Activities,Cognitive remediation/compensation,Disease management/prevention,Functional mobility training,Patient/family education,Splinting/orthotics,Therapeutic Exercise,Visual/perceptual remediation/compensation  OT Interventions Balance/vestibular training,Discharge planning,Pain management,Self Care/advanced ADL retraining,Therapeutic Activities,UE/LE Coordination activities,Visual/perceptual remediation/compensation,Therapeutic Exercise,Skin care/wound managment,Patient/family education,Functional mobility training,Disease mangement/prevention,Cognitive remediation/compensation,Community Teaching laboratory technician re-education,Psychosocial support,Splinting/orthotics,UE/LE Strength taining/ROM,Wheelchair propulsion/positioning  SLP Interventions    TR Interventions    SW/CM Interventions Discharge Planning,Psychosocial Support,Patient/Family Education   Barriers to Discharge MD  Medical stability and Nausea  Nursing      PT Inaccessible home environment,Home environment access/layout    OT Decreased caregiver support,Lack of/limited family support    SLP      SW Other (comments) language barrier needs interpreter   Team Discharge Planning: Destination: PT-Home ,OT- Home , SLP-  Projected Follow-up: PT-Home health PT,Outpatient PT,24 hour supervision/assistance (pending progress), OT-  Home health OT, SLP-  Projected Equipment Needs: PT-To be determined, OT- 3 in 1 bedside comode,To be determined, SLP-  Equipment Details: PT- , OT-daughter purchased TTB Patient/family involved in discharge planning: PT- Other (Comment),Patient,  OT-Patient,Family member/caregiver, SLP-   MD ELOS: 12-15 days. Medical Rehab  Prognosis:  Good Assessment: Right-handed female with history of borderline diabetes mellitus, chronic atrial fibrillation rheumatic heart disease status post mitral valve replacement tricuspid replacement maintained on chronic Coumadin, hypertension, hyperlipidemia, obesity with BMI 30.81.  She presented on 08/24/2020 with headache, nausea, vomiting and decreased mobility x2 days. 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 No downward herniation. CT abdomen pelvis showed large amount of stool no other abnormality identified. Chest x-ray showed marked cardiac enlargement with pulmonary vascular congestion. Admission chemistries hemoglobin 11, blood cultures no growth, lactic acid  2.9 INR 2.1 troponin negative. 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 Cardiology services continues to follow for cardiac history.  Follow-up cranial CT scan 08/31/2020 relatively stable size of intraparenchymal hemorrhage as compared to prior tracings.  Was on Depakote for headaches, however daughter states patient does not take this medication anymore.. Blood pressure monitored patient did require Cleviprex.  Echocardiogram ejection fraction 55 to 60%, no wall motion abnormalities. Currently on a regular diet. Patient with resulting functional deficits with mobility, balance, endurance, self-care.  We will set goals for Supervision with PT/OT.   Due to the current state of emergency, patients may not be receiving their 3-hours of Medicare-mandated therapy.  See Team Conference Notes for weekly updates to the plan of care

## 2020-09-02 NOTE — Progress Notes (Signed)
Inpatient Rehab Admissions Coordinator:   I have a bed on CIR for this pt. And will plan to admit today .RN may call report to [phone]  Megan Salon, MS, CCC-SLP Rehab Admissions Coordinator  [phone] (celll) [phone] (office)

## 2020-09-02 NOTE — Progress Notes (Signed)
Patient ID: Deborah [name], female   DOB: 09/12/1950, 71 y.o.   MRN: [id number] Met with the patient and her daughter to review role of the nurse CM and collaboration with the SW to assure educational needs addressed and facilitate preparation for discharge. Daughter reports patient to discharge home with her. Concerned about equipment and need to modify home prior to discharge. Reviewed preliminary goals set for min assist and team conference on Wednesdays; team will provide information needed to prepare the home and allow for practice prior to discharge. Reviewed secondary stroke risks including HTN, HLD , T2DM and obesity along with hx of HF and A-Fib. Patient to be on a heparin drip through Monday per daughter. Given handouts on dietary modifications, HF care/maintenance tips including the zone tool and information on iron deficiency anemia. Continue to follow along to discharge and address questions, concerns Margarito Liner

## 2020-09-02 NOTE — Discharge Summary (Deleted)
°  The note originally documented on this encounter has been moved the the encounter in which it belongs.  °

## 2020-09-03 ENCOUNTER — Inpatient Hospital Stay (HOSPITAL_COMMUNITY): Payer: Medicare Other | Admitting: Physical Therapy

## 2020-09-03 ENCOUNTER — Inpatient Hospital Stay (HOSPITAL_COMMUNITY): Payer: Medicare Other

## 2020-09-03 LAB — GLUCOSE, CAPILLARY
Glucose-Capillary: 157 mg/dL — ABNORMAL HIGH (ref 70–99)
Glucose-Capillary: 208 mg/dL — ABNORMAL HIGH (ref 70–99)
Glucose-Capillary: 94 mg/dL (ref 70–99)
Glucose-Capillary: 210 mg/dL — ABNORMAL HIGH (ref 70–99)

## 2020-09-03 LAB — HEPARIN LEVEL (UNFRACTIONATED): Heparin Unfractionated: 0.55 IU/mL (ref 0.30–0.70)

## 2020-09-03 MED ORDER — GLIMEPIRIDE 1 MG PO TABS
1.0000 mg | ORAL_TABLET | Freq: Every day | ORAL | Status: DC
  Administered 2020-09-03 – 2020-09-06 (×4): 1 mg via ORAL
  Filled 2020-09-03 (×4): qty 1

## 2020-09-03 NOTE — Progress Notes (Signed)
Occupational Therapy Session Note  Patient Details  Name: Deborah [name] MRN: [id number] Date of Birth: 1951-01-04  Today's Date: 09/03/2020 OT Individual Time: 1345-1430 OT Individual Time Calculation (min): 45 min    Short Term Goals: Week 1:  OT Short Term Goal 1 (Week 1): Pt will complete 2/3 steps of toileting CGA OT Short Term Goal 2 (Week 1): Pt will advance pants past hips with CGA OT Short Term Goal 3 (Week 1): Pt will stand to groom to demo improved activity tolerace OT Short Term Goal 4 (Week 1): Pt will transfer to TTB with CGA  Skilled Therapeutic Interventions/Progress Updates:  1;1. Pt received in bed agreeable to OT. Pt reporting just finished lunch and wanting to go to the bathroom. Pt completes ambulatory transfer with RW to/from bathroom at very decreased speed but MIN A. Pt able to manage pants prior to toileting and cleanse peri area after urination. Pt requires A to advance pants past hips. Pt attempts to wash hands at sink but is very nauseas therefore given hand sanitizer and emesis bag. Pt rests and subsides after 5 min. Pt escorted to tub room and OT demo TTB for pt. Pt verbalized liking TTB and requests to go back to room. Exited session with pt seated in bed, exit alarm on and call light in reach   Therapy Documentation Precautions:  Precautions Precautions: Fall,Other (comment) Precaution Comments: dizziness/nausea with mobility, try using gaze stabilization, monitor HR Restrictions Weight Bearing Restrictions: No General:   Vital Signs:  Pain:   ADL: ADL Grooming: Supervision/safety Where Assessed-Grooming: Sitting at sink Upper Body Bathing: Supervision/safety Where Assessed-Upper Body Bathing: Sitting at sink Lower Body Bathing: Minimal assistance Where Assessed-Lower Body Bathing: Sitting at sink Upper Body Dressing: Contact guard Where Assessed-Upper Body Dressing: Sitting at sink Lower Body Dressing: Moderate assistance Where  Assessed-Lower Body Dressing: Sitting at sink Toileting: Maximal assistance Where Assessed-Toileting: Teacher, adult education: Curator Method: Stand pivot Vision Baseline Vision/History: Wears glasses Wears Glasses: Reading only Patient Visual Report: No change from baseline Vision Assessment?: Yes Eye Alignment: Within Functional Limits Alignment/Gaze Preference: Within Defined Limits Tracking/Visual Pursuits: Decreased smoothness of vertical tracking Saccades: Overshoots;Additional eye shifts occurred during testing Convergence: Within functional limits Perception  Perception: Within Functional Limits Praxis Praxis: Intact Exercises:   Other Treatments:     Therapy/Group: Individual Therapy  Shon Hale 09/03/2020, 12:38 PM

## 2020-09-03 NOTE — Evaluation (Signed)
Physical Therapy Assessment and Plan  Patient Details  Name: Deborah [name] MRN: [id number] Date of Birth: 03-07-1951  PT Diagnosis: Abnormality of gait, Ataxia, Ataxic gait, Coordination disorder, Difficulty walking, Dizziness, Muscle weakness and Vertigo of central origin Rehab Potential: Good ELOS: ~ 2 weeks   Today's Date: 09/03/2020 PT Individual Time: 6389-3734 PT Individual Time Calculation (min): 69 min    Hospital Problem: Principal Problem:   Cerebellar stroke ([place]) Active Problems:   Intraparenchymal hemorrhage of brain [facility])   Past Medical History:  Past Medical History:  Diagnosis Date  . Allergic rhinitis   . Anemia   . Borderline diabetes   . Carpal tunnel syndrome   . Chronic atrial fibrillation ([place])    coumadin managed by [facility]. CVRR;  Event montor (7/12-8/12) showed no high rate episodes (patient continuously in atrial fibrillation).   . Chronic diastolic heart failure ([place])   . Eosinophilia 01/27/2013  . External hemorrhoid   . External hemorrhoids   . Fatigue   . Gout   . 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 Lamonte Sakai  . Hx of cardiovascular stress test    ETT-myoview (7/12): No evidence for ischemia or infarction  . Hyperlipidemia   . Hypertension   . Obesity   . Rheumatic heart disease    Status post mechanical (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 Rotator cuff syndrome    Past Surgical History:  Past Surgical History:  Procedure Laterality Date  . CARDIAC CATHETERIZATION     EF 55%  . TRICUSPID VALVE REPAIR      Assessment & Plan Clinical Impression: Patient is a  70 year old right-handed female with history of  borderline diabetes mellitus, chronic atrial fibrillation rheumatic heart disease status post mitral valve replacement tricuspid replacement maintained on chronic Coumadin, hypertension, hyperlipidemia, obesity with BMI 30.81.  History taken from chart review, daughter, and patient.  Patient lives with her family independent prior to admission. 1 level home 3 steps to entry independent and active. She does not drive.  She presented on 08/24/2020 with headache, nausea, vomiting and decreased mobility x2 days. 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 No downward herniation. CT abdomen pelvis showed large amount of stool no other abnormality identified. Chest x-ray showed marked cardiac enlargement with pulmonary vascular congestion. Admission chemistries hemoglobin 11, blood cultures no growth, lactic acid 2.9 INR 2.1 troponin negative. Chronic Coumadin currently discontinued due to [place] however she was cleared to begin subcutaneous heparin for DVT prophylaxis 08/25/2020 and changed to IV heparin 08/29/2020. Cardiology services continues to follow for cardiac history.  Follow-up cranial CT scan 08/31/2020 relatively stable size of intraparenchymal hemorrhage as compared to prior tracings.  Was on Depakote for headaches, however daughter states patient does not take this medication anymore.. Blood pressure monitored patient did require Cleviprex.  Echocardiogram ejection fraction 55 to 60%, no wall motion abnormalities. Currently on a regular diet. Due to patient's decreased functional ability therapy evaluations completed and patient was admitted for a comprehensive rehab program.  Patient with resulting functional deficits with mobility, balance, endurance, self-care. Patient transferred to CIR on 08/28/2020 .  Patient currently requires mod assist with mobility secondary to muscle weakness, decreased cardiorespiratoy  endurance, impaired timing and sequencing, unbalanced muscle activation, ataxia and decreased coordination, central origin and decreased sitting balance, decreased standing balance, decreased postural control and decreased balance strategies.  Prior to hospitalization, patient was independent  with mobility and lived with Daughter,Spouse in a House home.  Home access is front 4; back 9 STE; or side entrance with 1 step-up (but would need to walk through ~25f of grass) .  Patient will benefit from skilled PT intervention to maximize safe functional mobility, minimize fall risk and decrease caregiver burden for planned discharge home with 24 hour supervision.  Anticipate patient will benefit from follow up OP at discharge.  PT - End of Session Activity Tolerance: Tolerates 30+ min activity with multiple rests Endurance Deficit: Yes Endurance Deficit Description: requires frequent seated rests due to nausea/dizziness PT Assessment Rehab Potential (ACUTE/IP ONLY): Good PT Barriers to Discharge: Inaccessible home environment;Home environment access/layout PT Patient demonstrates impairments in the following area(s): Balance;Perception;Behavior;Safety;Edema;Sensory;Endurance;Skin Integrity;Motor;Nutrition;Pain PT Transfers Functional Problem(s): Bed Mobility;Bed to Chair;Car;Furniture;Floor PT Locomotion Functional Problem(s): Ambulation;Stairs PT Plan PT Intensity: Minimum of 1-2 x/day ,45 to 90 minutes PT Frequency: 5 out of 7 days PT Duration Estimated Length of Stay: ~ 2 weeks PT Treatment/Interventions: Ambulation/gait training;Community reintegration;DME/adaptive equipment instruction;Neuromuscular re-education;Psychosocial support;Stair training;UE/LE Strength taining/ROM;Balance/vestibular training;Discharge planning;Functional electrical stimulation;Pain management;Skin care/wound management;UE/LE Coordination activities;Therapeutic Activities;Cognitive remediation/compensation;Disease  management/prevention;Functional mobility training;Patient/family education;Splinting/orthotics;Therapeutic Exercise;Visual/perceptual remediation/compensation PT Transfers Anticipated Outcome(s): supervision using LRAD PT Locomotion Anticipated Outcome(s): supervision using LRAD PT Recommendation Recommendations for Other Services: Therapeutic Recreation consult Therapeutic Recreation Interventions: Outing/community reintergration Follow Up Recommendations: Home health PT;Outpatient PT;24 hour supervision/assistance (pending progress) Patient destination: Home Equipment Recommended: To be determined   PT Evaluation Precautions/Restrictions Precautions Precautions: Fall;Other (comment) Precaution Comments: dizziness/nausea with mobility, try using gaze stabilization, monitor HR Restrictions Weight Bearing Restrictions: No Pain Pain Assessment Pain Scale: 0-10 Pain Score: 0-No pain Home Living/Prior Functioning Home Living Available Help at Discharge: Family;Available 24 hours/day (daughter plans to provide 24hr support at D/C at least initially) Type of Home: House Home Access: Stairs to enter [employer] Steps: front 4; back 9 STE; or side entrance with 1 step-up (but would need to walk through ~171fof grass) Entrance Stairs-Rails: None (at front) Home Layout: One level  Lives With: Daughter;Spouse Prior Function Level of Independence: Independent with gait;Independent with transfers;Independent with homemaking with ambulation  Able to Take Stairs?: Yes Driving: No Comments: completely independent; cooking; light cleaning; laundry Perception  Perception Perception: Within Functional Limits Praxis Praxis: Intact  Cognition Overall Cognitive Status: Within Functional Limits for tasks assessed Arousal/Alertness: Awake/alert Orientation Level: Oriented X4 Attention: Focused;Sustained Focused Attention: Appears intact Sustained Attention: Appears  intact Memory: Appears intact Awareness: Appears intact Safety/Judgment: Appears intact Sensation Sensation Light Touch: Appears Intact Hot/Cold: Not tested Proprioception: Appears Intact Stereognosis: Not tested Coordination Gross Motor Movements are Fluid and Coordinated: No Coordination and Movement Description: impaired due to generalized weakness and truncal ataxia  Motor  Motor Motor: Ataxia;Other (comment) Motor - Skilled Clinical Observations: generalized weakness   Trunk/Postural Assessment  Cervical Assessment Cervical Assessment: Exceptions to WFT[facility] Children[facility] Hospitalslight forward head) Thoracic Assessment Thoracic Assessment: Exceptions to WFGarrett Eye Centerthoracic rounding) Lumbar Assessment Lumbar Assessment: Exceptions to WFCommunity Hospital Southposterior pelvic tilt in sitting) Postural Control Postural Control: Deficits on evaluation Trunk Control: truncal ataxia noted sitting EOB with pt using bedrails for stability Postural Limitations: decreased  Balance Balance Balance Assessed: Yes Static Sitting Balance Static Sitting - Balance Support: Feet  unsupported;No upper extremity supported Static Sitting - Level of Assistance: 4: Min assist Dynamic Sitting Balance Dynamic Sitting - Balance Support: Feet unsupported Dynamic Sitting - Level of Assistance: 4: Min assist;3: Mod assist Static Standing Balance Static Standing - Balance Support: During functional activity;Left upper extremity supported Static Standing - Level of Assistance: 4: Min assist Dynamic Standing Balance Dynamic Standing - Balance Support: During functional activity;Left upper extremity supported Dynamic Standing - Level of Assistance: 4: Min assist;3: Mod assist Extremity Assessment      RLE Assessment RLE Assessment: Exceptions to WFL Active Range of Motion (AROM) Comments: WFL General Strength Comments: not formally assessed, demonstrates grossly 4/5 functionally LLE Assessment LLE Assessment: Exceptions to WFL Active Range of  Motion (AROM) Comments: WFL General Strength Comments: not formally assessed, demonstrates grossly 4/5 functionally  Care Tool Care Tool Bed Mobility Roll left and right activity   Roll left and right assist level: Minimal Assistance - Patient > 75%    Sit to lying activity   Sit to lying assist level: Minimal Assistance - Patient > 75%    Lying to sitting edge of bed activity   Lying to sitting edge of bed assist level: Moderate Assistance - Patient 50 - 74%     Care Tool Transfers Sit to stand transfer   Sit to stand assist level: Minimal Assistance - Patient > 75%    Chair/bed transfer   Chair/bed transfer assist level: Minimal Assistance - Patient > 75%     Toilet transfer        Car transfer Car transfer activity did not occur: Safety/medical concerns        Care Tool Locomotion Ambulation   Assist level: Minimal Assistance - Patient > 75% Assistive device: Hand held assist Max distance: 22ft  Walk 10 feet activity   Assist level: Minimal Assistance - Patient > 75% Assistive device: Hand held assist   Walk 50 feet with 2 turns activity Walk 50 feet with 2 turns activity did not occur: Safety/medical concerns      Walk 150 feet activity Walk 150 feet activity did not occur: Safety/medical concerns      Walk 10 feet on uneven surfaces activity Walk 10 feet on uneven surfaces activity did not occur: Safety/medical concerns      Stairs Stair activity did not occur: Safety/medical concerns        Walk up/down 1 step activity Walk up/down 1 step or curb (drop down) activity did not occur: Safety/medical concerns     Walk up/down 4 steps activity did not occuR: Safety/medical concerns  Walk up/down 4 steps activity      Walk up/down 12 steps activity Walk up/down 12 steps activity did not occur: Safety/medical concerns      Pick up small objects from floor Pick up small object from the floor (from standing position) activity did not occur: Safety/medical  concerns      Wheelchair Will patient use wheelchair at discharge?: No          Wheel 50 feet with 2 turns activity      Wheel 150 feet activity        Refer to Care Plan for Long Term Goals  SHORT TERM GOAL WEEK 1 PT Short Term Goal 1 (Week 1): Pt will perform supine<>sit with min assist consistently (not using bed features) PT Short Term Goal 2 (Week 1): Pt will perform sit<>stands using LRAD with CGA PT Short Term Goal 3 (Week 1): Pt will perform bed<>chair transfers using   LRAD with CGA PT Short Term Goal 4 (Week 1): Pt will ambulate at least 50ft using LRAD with CGA PT Short Term Goal 5 (Week 1): Pt will ascend/descend 4 steps using HRs with CGA  Recommendations for other services: Therapeutic Recreation  Outing/community reintegration  Skilled Therapeutic Intervention Pt received supine in bed with her daughter, Trina, present and pt agreeable to therapy session. Evaluation completed (see details above) with patient education regarding purpose of PT evaluation, PT POC and goals, therapy schedule, weekly team meetings, and other CIR information including safety plan and fall risk safety. Reports dizziness, stating medication administered earlier today but pt reports it wears off quickly (no additional meds available at this time - moved slowly and provided rest breaks for symptom management). Pt reports need to use bathroom. Supine>sitting R EOB, left HOB elevated to avoid worsening of dizziness/nausea symptoms and using bedrails, with min assist for trunk upright and stability - cuing for sequencing to increase pt independence. Noted to have some truncal ataxia sitting EOB with pt using B UE support on bedrails for stability. L stand pivot EOB.w/c using UE support on bedrails and wheelchair armrests with min assist for lifting and balance while turning. Transported in/out bathroom in w/c. R stand pivot w/c>BSC over toilet using UE support on grab bar and BSC arm rests with min assist.  Standing with min assist during total assist LB clothing management. Continent of bladder and bowels. Seated peri-care via pt's daughter assisting per pt request. L stand pivot back to w/c as described above. Transported to therapy gym. Gait training 22ft x2 using L HHA and heavy min assist/light mod assist for balance (daughter providing +2 assist for IV pole management) - demos very slow gait speed with reciprocal stepping pattern though short step lengths and decreased foot clearance. Pt experiencing increased nausea after ambulation requiring emesis back but no vomiting - therapist provided cool wash clothe. Pt requesting to return to bed. Transported back to room. L stand pivot w/c>EOB with B UE support on therapist and heavier min assist for balance compared to when using UE support on bedrails/armrests. Sit>supine, HOB partially elevated, with CGA for steadying. Supine scoot towards HOB using bed features without assist. Therapist retrieved pt shorter and smaller w/c for improved fit to promote increased upright, OOB activity tolerance. Pt left supine in bed with needs in reach, HOB elevated, lines intact, bed alarm on, and pt's daughter present.  Mobility Bed Mobility Bed Mobility: Supine to Sit;Sit to Supine Supine to Sit: Moderate Assistance - Patient 50-74%;Minimal Assistance - Patient > 75% Sit to Supine: Minimal Assistance - Patient > 75% Transfers Transfers: Sit to Stand;Stand to Sit;Stand Pivot Transfers Sit to Stand: Minimal Assistance - Patient > 75%;Moderate Assistance - Patient 50-74% Stand to Sit: Minimal Assistance - Patient > 75%;Moderate Assistance - Patient 50-74% Stand Pivot Transfers: Minimal Assistance - Patient > 75%;Moderate Assistance - Patient 50 - 74% Stand Pivot Transfer Details: Tactile cues for initiation;Tactile cues for sequencing;Manual facilitation for weight shifting;Verbal cues for gait pattern;Verbal cues for technique;Verbal cues for sequencing;Verbal cues for  precautions/safety Transfer (Assistive device): 1 person hand held assist Locomotion  Gait Ambulation: Yes Gait Assistance: Minimal Assistance - Patient > 75%;Moderate Assistance - Patient 50-74% (+2 assist for IV pole) Gait Distance (Feet): 22 Feet Assistive device: 1 person hand held assist Gait Assistance Details: Verbal cues for technique;Verbal cues for gait pattern;Verbal cues for sequencing;Tactile cues for sequencing;Tactile cues for initiation;Tactile cues for weight shifting Gait Assistance Details: L HHA Gait Gait:   Yes Gait Pattern: Impaired Gait Pattern: Decreased step length - left;Decreased step length - right;Decreased stride length;Poor foot clearance - left;Poor foot clearance - right;Narrow base of support Gait velocity: significantly decreased Stairs / Additional Locomotion Stairs: No Wheelchair Mobility Wheelchair Mobility: No   Discharge Criteria: Patient will be discharged from PT if patient refuses treatment 3 consecutive times without medical reason, if treatment goals not met, if there is a change in medical status, if patient makes no progress towards goals or if patient is discharged from hospital.  The above assessment, treatment plan, treatment alternatives and goals were discussed and mutually agreed upon: by patient and by family  Carly M Pippin , PT, DPT, CSRS  09/03/2020, 7:44 AM   

## 2020-09-03 NOTE — Progress Notes (Signed)
ANTICOAGULATION CONSULT NOTE  Pharmacy Consult for heparin Indication: Mechanical mitral valve  Allergies  Allergen Reactions  . Promethazine Hcl Other (See Comments)    REACTION: made her real shaky    Patient Measurements: Height: 4\' 11"  (149.9 cm) Weight: 64.9 kg (143 lb 1.3 oz) IBW/kg (Calculated) : 43.2 Heparin Dosing Weight: 69.2 kg   Vital Signs: Temp: 97.4 F (36.3 C) (01/08 0621) Temp Source: Axillary (01/08 0621) BP: 108/79 (01/08 0621) Pulse Rate: 89 (01/08 0621)  Labs: Recent Labs    08/31/20 1049 08/31/20 1244 09/01/20 0239 06-Sep-2020 0131 09/03/20 0514  HGB  --   --  13.6  --   --   HCT  --   --  43.1  --   --   PLT  --   --  192  --   --   HEPARINUNFRC  --    < > 0.32 0.30 0.55  CREATININE 1.34*  --  1.40* 1.67*  --    < > = values in this interval not displayed.    Estimated Creatinine Clearance: 26.8 mL/min (A) (by C-G formula based on SCr of 1.67 mg/dL (H)).  Assessment: 49 YOF on warfarin at home for h/o mechanical mitral valve presented to the ED on 12/29 with cerebellar vermis hemorrhage. Warfarin was reversed with 26F-PCC and vitamin K and anticoagulation has been on hold since. Pharmacy consulted to resume IV heparin per stroke protocol.  Heparin level remains therapeutic at 0.55 after slight rate increase yesterday. Plan is to resume warfarin after 2 wks from hemorrhage. No CBC today, no bleeding noted.   Goal of Therapy:  Heparin level 0.3-0.5 units/ml Monitor platelets by anticoagulation protocol: Yes   Plan:  -Continue IV heparin at 750 units/hr -Daily heparin level, CBC -Monitor for s/sx of bleeding  Thank you for involving pharmacy in this patient's care.  1/30, PharmD PGY1 Pharmacy Resident 09/03/2020 7:56 AM  Please check [email or web address] for unit-specific pharmacy phone numbers.

## 2020-09-03 NOTE — Evaluation (Signed)
Occupational Therapy Assessment and Plan  Patient Details  Name: Deborah [name] MRN: [id number] Date of Birth: 11/04/1950  OT Diagnosis: abnormal posture, acute pain and muscle weakness (generalized) Rehab Potential: Rehab Potential (ACUTE ONLY): Good ELOS: 10-14   Today's Date: 09/03/2020 OT Individual Time: 5885-0277 OT Individual Time Calculation (min): 75 min     Hospital Problem: Principal Problem:   Cerebellar stroke ([place]) Active Problems:   Intraparenchymal hemorrhage of brain [facility])   Past Medical History:  Past Medical History:  Diagnosis Date  . Allergic rhinitis   . Anemia   . Borderline diabetes   . Carpal tunnel syndrome   . Chronic atrial fibrillation ([place])    coumadin managed by [facility]. CVRR;  Event montor (7/12-8/12) showed no high rate episodes (patient continuously in atrial fibrillation).   . Chronic diastolic heart failure ([place])   . Eosinophilia 01/27/2013  . External hemorrhoid   . External hemorrhoids   . Fatigue   . Gout   . 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 Lamonte Sakai  . Hx of cardiovascular stress test    ETT-myoview (7/12): No evidence for ischemia or infarction  . Hyperlipidemia   . Hypertension   . Obesity   . Rheumatic heart disease    Status post mechanical ([employer]. 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 Rotator cuff syndrome    Past Surgical History:  Past Surgical History:  Procedure Laterality Date  . CARDIAC CATHETERIZATION     EF 55%  . TRICUSPID VALVE REPAIR      Assessment & Plan Clinical Impression: The pt is a 70 yo female presenting with severe headache, nausea, and vomiting. CT revealed focal intraparenchymal  hemorrhage within inter cerebellar hemisphere. PMH includes: chronic afib, gout, HLD, HTN, obesity  Patient currently requires min with basic self-care skills secondary to muscle weakness, decreased cardiorespiratoy endurance, impaired timing and sequencing, decreased visual perceptual skills and decreased visual motor skills, decreased motor planning and decreased sitting balance, decreased standing balance, decreased postural control and decreased balance strategies.  Prior to hospitalization, patient could complete BADL/IADL with independent .  Patient will benefit from skilled intervention to decrease level of assist with basic self-care skills and increase independence with basic self-care skills prior to discharge home with care partner.  Anticipate patient will require 24 hour supervision and follow up home health.  OT - End of Session Endurance Deficit: Yes Endurance Deficit Description: requires frequent seated rests due to nausea/dizziness OT Assessment Rehab Potential (ACUTE ONLY): Good OT Barriers to Discharge: Decreased caregiver support;Lack of/limited family support OT Patient demonstrates impairments in the following area(s): Balance;Cognition;Endurance;Motor;Pain;Vision OT Basic ADL's Functional Problem(s): Grooming;Bathing;Dressing;Toileting OT Transfers Functional Problem(s): Toilet;Tub/Shower OT Plan OT Intensity: Minimum of 1-2 x/day, 45 to 90 minutes OT Frequency: 5 out of 7 days OT Duration/Estimated Length of Stay: 10-14 OT Treatment/Interventions: Balance/vestibular training;Discharge planning;Pain management;Self Care/advanced ADL retraining;Therapeutic Activities;UE/LE Coordination activities;Visual/perceptual remediation/compensation;Therapeutic Exercise;Skin care/wound managment;Patient/family education;Functional mobility training;Disease mangement/prevention;Cognitive remediation/compensation;Community reintegration;DME/adaptive equipment instruction;Neuromuscular  re-education;Psychosocial support;Splinting/orthotics;UE/LE Strength taining/ROM;Wheelchair propulsion/positioning OT Self Feeding Anticipated Outcome(s): S OT Basic Self-Care Anticipated Outcome(s): S OT Toileting Anticipated Outcome(s): S OT Bathroom Transfers Anticipated Outcome(s): S OT Recommendation Patient destination: Home Follow  Up Recommendations: Home health OT Equipment Recommended: 3 in 1 bedside comode;To be determined Equipment Details: daughter purchased TTB   OT Evaluation Precautions/Restrictions  Precautions Precautions: Fall;Other (comment) Precaution Comments: dizziness/nausea with mobility, try using gaze stabilization, monitor HR Restrictions Weight Bearing Restrictions: No General   Vital Signs  Pain Pain Assessment Pain Scale: 0-10 Pain Score: 0-No pain Home Living/Prior Functioning Home Living Available Help at Discharge: Family,Available 24 hours/day (daughter plans to provide 24hr support at D/C at least initially) Type of Home: House Home Access: Stairs to enter [employer] of Steps: front 4; back 9 STE; or side entrance with 1 step-up (but would need to walk through ~70f of grass) Entrance Stairs-Rails: None (at front) Home Layout: One level  Lives With: Daughter,Spouse Prior Function Level of Independence: Independent with gait,Independent with transfers,Independent with homemaking with ambulation  Able to Take Stairs?: Yes Driving: No Comments: completely independent; cooking; light cleaning; laundry Vision Baseline Vision/History: Wears glasses Wears Glasses: Reading only Patient Visual Report: No change from baseline Vision Assessment?: Yes Eye Alignment: Within Functional Limits Alignment/Gaze Preference: Within Defined Limits Tracking/Visual Pursuits: Decreased smoothness of vertical tracking Saccades: Overshoots;Additional eye shifts occurred during testing Convergence: Within functional limits Perception   Perception: Within Functional Limits Praxis Praxis: Intact Cognition Overall Cognitive Status: Within Functional Limits for tasks assessed Arousal/Alertness: Awake/alert Orientation Level: Person;Place;Situation Person: Oriented Place: Oriented Situation: Oriented Year: 2022 Month: January Day of Week: Correct Memory: Appears intact Immediate Memory Recall: Sock;Blue;Bed Memory Recall Sock: Not able to recall Memory Recall Blue: With Cue Memory Recall Bed: Without Cue Attention: Focused;Sustained Focused Attention: Appears intact Sustained Attention: Appears intact Awareness: Appears intact Problem Solving: Appears intact Safety/Judgment: Appears intact Sensation Sensation Light Touch: Appears Intact Hot/Cold: Not tested Proprioception: Appears Intact Stereognosis: Not tested Coordination Gross Motor Movements are Fluid and Coordinated: No Coordination and Movement Description: impaired due to generalized weakness and truncal ataxia Motor  Motor Motor: Ataxia;Other (comment) Motor - Skilled Clinical Observations: generalized weakness  Trunk/Postural Assessment  Cervical Assessment Cervical Assessment: Exceptions to [facility](slight forward head) Thoracic Assessment Thoracic Assessment: Exceptions to [facility](thoracic rounding) Lumbar Assessment Lumbar Assessment: Exceptions to [facility](posterior pelvic tilt in sitting) Postural Control Postural Control: Deficits on evaluation Trunk Control: truncal ataxia noted sitting EOB with pt using bedrails for stability Postural Limitations: decreased  Balance Balance Balance Assessed: Yes Static Sitting Balance Static Sitting - Balance Support: Feet unsupported;No upper extremity supported Static Sitting - Level of Assistance: 4: Min assist Dynamic Sitting Balance Dynamic Sitting - Balance Support: Feet unsupported Dynamic Sitting - Level of Assistance: 4: Min assist;3: Mod assist Static Standing Balance Static Standing - Balance  Support: During functional activity;Left upper extremity supported Static Standing - Level of Assistance: 4: Min assist Dynamic Standing Balance Dynamic Standing - Balance Support: During functional activity;Left upper extremity supported Dynamic Standing - Level of Assistance: 4: Min assist;3: Mod assist Extremity/Trunk Assessment RUE Assessment RUE Assessment: Exceptions to [facility] Comments: generalized weakness LUE Assessment LUE Assessment: Exceptions to [facility] Strength Comments: generalized weakness  Care Tool Care Tool Self Care Eating        Oral Care    Oral Care Assist Level: Supervision/Verbal cueing    Bathing   Body parts bathed by patient: Right arm;Left arm;Chest;Abdomen;Front perineal area;Right upper leg;Left upper leg;Right lower leg;Left lower leg;Face Body parts bathed by helper: Buttocks   Assist Level: Minimal Assistance - Patient > 75%    Upper Body Dressing(including orthotics)   What is the patient wearing?:  Pull over shirt   Assist Level: Minimal Assistance - Patient > 75%    Lower Body Dressing (excluding footwear)   What is the patient wearing?: Pants;Incontinence brief Assist for lower body dressing: Moderate Assistance - Patient 50 - 74%    Putting on/Taking off footwear   What is the patient wearing?: Non-skid slipper socks Assist for footwear: Supervision/Verbal cueing       Care Tool Toileting Toileting activity   Assist for toileting: Maximal Assistance - Patient 25 - 49%     Care Tool Bed Mobility Roll left and right activity   Roll left and right assist level: Minimal Assistance - Patient > 75%    Sit to lying activity   Sit to lying assist level: Minimal Assistance - Patient > 75%    Lying to sitting edge of bed activity   Lying to sitting edge of bed assist level: Moderate Assistance - Patient 50 - 74%     Care Tool Transfers Sit to stand transfer   Sit to stand assist level: Minimal Assistance - Patient  > 75%    Chair/bed transfer   Chair/bed transfer assist level: Minimal Assistance - Patient > 75%     Toilet transfer   Assist Level: Minimal Assistance - Patient > 75%     Care Tool Cognition Expression of Ideas and Wants Expression of Ideas and Wants: Without difficulty (complex and basic) - expresses complex messages without difficulty and with speech that is clear and easy to understand   Understanding Verbal and Non-Verbal Content Understanding Verbal and Non-Verbal Content: Understands (complex and basic) - clear comprehension without cues or repetitions   Memory/Recall Ability *first 3 days only Memory/Recall Ability *first 3 days only: Current season;Staff names and faces;That he or she is in a hospital/hospital unit    Refer to Care Plan for [place] 1 OT Short Term Goal 1 (Week 1): Pt will complete 2/3 steps of toileting CGA OT Short Term Goal 2 (Week 1): Pt will advance pants past hips with CGA OT Short Term Goal 3 (Week 1): Pt will stand to groom to demo improved activity tolerace OT Short Term Goal 4 (Week 1): Pt will transfer to TTB with CGA  Recommendations for other services: None    Skilled Therapeutic Intervention 1:1. Pt and daughter present for session. OT educates on role/purpose of OT, CIR, ELOS and CVA recovery. Pt completes all transfers and sit to stands with MIN HHA with VC for safe hand placement. Pt completes BADL as stated below. Pt very guarded with all mobility d/t dizziness and nausea. OT demo TTB for daughter as pt has tub shower combo at home. Daughter purchases off of [place] while [place] present.   ADL ADL Grooming: Supervision/safety Where Assessed-Grooming: Sitting at sink Upper Body Bathing: Supervision/safety Where Assessed-Upper Body Bathing: Sitting at sink Lower Body Bathing: Minimal assistance Where Assessed-Lower Body Bathing: Sitting at sink Upper Body Dressing: Contact guard Where Assessed-Upper Body  Dressing: Sitting at sink Lower Body Dressing: Moderate assistance Where Assessed-Lower Body Dressing: Sitting at sink Toileting: Maximal assistance Where Assessed-Toileting: Glass blower/designer: Minimal Print production planner Method: Stand pivot Mobility  Bed Mobility Bed Mobility: Supine to Sit;Sit to Supine Supine to Sit: Moderate Assistance - Patient 50-74%;Minimal Assistance - Patient > 75% Sit to Supine: Minimal Assistance - Patient > 75% Transfers Sit to Stand: Minimal Assistance - Patient > 75%;Moderate Assistance - Patient 50-74% Stand to Sit: Minimal Assistance - Patient > 75%;Moderate Assistance -  Patient 50-74%   Discharge Criteria: Patient will be discharged from OT if patient refuses treatment 3 consecutive times without medical reason, if treatment goals not met, if there is a change in medical status, if patient makes no progress towards goals or if patient is discharged from hospital.  The above assessment, treatment plan, treatment alternatives and goals were discussed and mutually agreed upon: by patient and by family  Tonny Branch 09/03/2020, 11:58 AM

## 2020-09-03 NOTE — Plan of Care (Signed)
  Problem: Consults Goal: RH STROKE PATIENT EDUCATION Description: See Patient Education module for education specifics  Outcome: Progressing   Problem: RH BLADDER ELIMINATION Goal: RH STG MANAGE BLADDER WITH ASSISTANCE Description: STG Manage Bladder With mod I Assistance Outcome: Progressing   Problem: RH KNOWLEDGE DEFICIT Goal: RH STG INCREASE KNOWLEDGE OF DIABETES Description: Pt will be able to demonstrate understanding of medication regimen, dietary and lifestyle modifications to better control blood glucose levels with mod I assist using handouts and booklet provided.  Outcome: Progressing Goal: RH STG INCREASE KNOWLEDGE OF HYPERTENSION Description: Pt will be able to demonstrate understanding of medication regimen, dietary and lifestyle modifications to better control blood pressure with mod I assist using handouts and booklet provided.  Outcome: Progressing Goal: RH STG INCREASE KNOWLEGDE OF HYPERLIPIDEMIA Description: Pt will be able to demonstrate understanding of medication regimen, dietary and lifestyle modifications to better control blood cholesterol levels with mod I assist using handouts and booklet provided.  Outcome: Progressing Goal: RH STG INCREASE KNOWLEDGE OF STROKE PROPHYLAXIS Description: Pt will be able to demonstrate understanding of medication regimen, dietary and lifestyle modifications to prevent stroke with mod I assist using handouts and booklet provided.  Outcome: Progressing   

## 2020-09-04 ENCOUNTER — Inpatient Hospital Stay (HOSPITAL_COMMUNITY): Payer: Medicare Other

## 2020-09-04 DIAGNOSIS — R0789 Other chest pain: Secondary | ICD-10-CM

## 2020-09-04 LAB — CBC
HCT: 40.5 % (ref 36.0–46.0)
Hemoglobin: 13.5 g/dL (ref 12.0–15.0)
MCH: 24.2 pg — ABNORMAL LOW (ref 26.0–34.0)
MCHC: 33.3 g/dL (ref 30.0–36.0)
MCV: 72.5 fL — ABNORMAL LOW (ref 80.0–100.0)
Platelets: 152 10*3/uL (ref 150–400)
RBC: 5.59 MIL/uL — ABNORMAL HIGH (ref 3.87–5.11)
RDW: 16.3 % — ABNORMAL HIGH (ref 11.5–15.5)
WBC: 9.5 10*3/uL (ref 4.0–10.5)
nRBC: 0 % (ref 0.0–0.2)

## 2020-09-04 LAB — TROPONIN I (HIGH SENSITIVITY)
Troponin I (High Sensitivity): 14 ng/L (ref <18)
Troponin I (High Sensitivity): 25 ng/L — ABNORMAL HIGH (ref <18)

## 2020-09-04 LAB — HEPARIN LEVEL (UNFRACTIONATED): Heparin Unfractionated: 0.44 IU/mL (ref 0.30–0.70)

## 2020-09-04 LAB — BASIC METABOLIC PANEL
Anion gap: 13 (ref 5–15)
BUN: 40 mg/dL — ABNORMAL HIGH (ref 8–23)
CO2: 21 mmol/L — ABNORMAL LOW (ref 22–32)
Calcium: 9.7 mg/dL (ref 8.9–10.3)
Chloride: 99 mmol/L (ref 98–111)
Creatinine, Ser: 1.64 mg/dL — ABNORMAL HIGH (ref 0.44–1.00)
GFR, Estimated: 34 mL/min — ABNORMAL LOW (ref >60)
Glucose, Bld: 265 mg/dL — ABNORMAL HIGH (ref 70–99)
Potassium: 3.5 mmol/L (ref 3.5–5.1)
Sodium: 133 mmol/L — ABNORMAL LOW (ref 135–145)

## 2020-09-04 LAB — CK TOTAL AND CKMB (NOT AT ARMC)
CK, MB: 0.8 ng/mL (ref 0.5–5.0)
Relative Index: INVALID (ref 0.0–2.5)
Total CK: 31 U/L — ABNORMAL LOW (ref 38–234)

## 2020-09-04 LAB — GLUCOSE, CAPILLARY
Glucose-Capillary: 164 mg/dL — ABNORMAL HIGH (ref 70–99)
Glucose-Capillary: 219 mg/dL — ABNORMAL HIGH (ref 70–99)
Glucose-Capillary: 145 mg/dL — ABNORMAL HIGH (ref 70–99)
Glucose-Capillary: 235 mg/dL — ABNORMAL HIGH (ref 70–99)

## 2020-09-04 NOTE — Progress Notes (Signed)
Patient went down for CT scan. Order says to call Dr. Pearlean Brownie or Dr. Roda Shutters after the CT scan. On call provider Dr. Amada Jupiter called, no new orders, told to let day shift know the scan was completed and have them call Dr. Pearlean Brownie or Dr. Roda Shutters. Patient back in room with call bell within reach.

## 2020-09-04 NOTE — Plan of Care (Signed)
  Problem: Consults Goal: RH STROKE PATIENT EDUCATION Description: See Patient Education module for education specifics  Outcome: Progressing   Problem: RH BLADDER ELIMINATION Goal: RH STG MANAGE BLADDER WITH ASSISTANCE Description: STG Manage Bladder With mod I Assistance Outcome: Progressing   Problem: RH KNOWLEDGE DEFICIT Goal: RH STG INCREASE KNOWLEDGE OF DIABETES Description: Pt will be able to demonstrate understanding of medication regimen, dietary and lifestyle modifications to better control blood glucose levels with mod I assist using handouts and booklet provided.  Outcome: Progressing Goal: RH STG INCREASE KNOWLEDGE OF HYPERTENSION Description: Pt will be able to demonstrate understanding of medication regimen, dietary and lifestyle modifications to better control blood pressure with mod I assist using handouts and booklet provided.  Outcome: Progressing Goal: RH STG INCREASE KNOWLEGDE OF HYPERLIPIDEMIA Description: Pt will be able to demonstrate understanding of medication regimen, dietary and lifestyle modifications to better control blood cholesterol levels with mod I assist using handouts and booklet provided.  Outcome: Progressing Goal: RH STG INCREASE KNOWLEDGE OF STROKE PROPHYLAXIS Description: Pt will be able to demonstrate understanding of medication regimen, dietary and lifestyle modifications to prevent stroke with mod I assist using handouts and booklet provided.  Outcome: Progressing

## 2020-09-04 NOTE — Progress Notes (Signed)
Deborah [name] PHYSICAL MEDICINE & REHABILITATION PROGRESS NOTE   Subjective/Complaints: Pt awoke with left sided chest pain this morning. Daughter placed some liniment oil with some benefit. Pt indicates that she feels sl sob. Daughter had questions about head CT, timing of coumadin etc  ROS: Patient denies fever, rash, sore throat, blurred vision,   diarrhea, cough,   joint or back pain, headache, or mood change.    Objective:   CT HEAD WO CONTRAST  Result Date: 09/04/2020 CLINICAL DATA:  Follow-up examination for acute stroke. EXAM: CT HEAD WITHOUT CONTRAST TECHNIQUE: Contiguous axial images were obtained from the base of the skull through the vertex without intravenous contrast. COMPARISON:  Prior head CT from 08/31/2020 FINDINGS: Brain: Continued interval evolution of parenchymal hemorrhage centered at the cerebellar vermis, overall slightly decreased in size and less dense as compared to previous exam. Similar to perhaps slightly decreased surrounding edema. Adjacent fourth ventricle remains widely patent. No hydrocephalus. No other new intracranial hemorrhage or large vessel territory infarct. Chronic microvascular ischemic disease again noted. No midline shift or extra-axial fluid collection. Vascular: No hyperdense vessel. Scattered vascular calcifications noted within the carotid siphons. Skull: Scalp soft tissues and calvarium are stable in appearance. No new finding. Sinuses/Orbits: Globes and orbital soft tissues demonstrate no acute finding. Paranasal sinuses and mastoid air cells remain clear. Other: None. IMPRESSION: 1. Continued interval evolution of parenchymal hemorrhage centered at the cerebellar vermis, overall slightly decreased in size and less dense as compared to previous exam. Slightly decreased surrounding edema. No hydrocephalus. 2. No other new acute intracranial abnormality. Electronically Signed   By: Rise Mu M.D.   On: 09/04/2020 02:08   Recent Labs     09/04/20 0520  WBC 9.5  HGB 13.5  HCT 40.5  PLT 152   Recent Labs    2020/09/04 0131  NA 134*  K 4.8  CL 99  CO2 23  GLUCOSE 193*  BUN 44*  CREATININE 1.67*  CALCIUM 10.8*    Intake/Output Summary (Last 24 hours) at 09/04/2020 0825 Last data filed at 09/04/2020 0816 Gross per 24 hour  Intake 840 ml  Output --  Net 840 ml        Physical Exam: Vital Signs Blood pressure 119/84, pulse 93, temperature 97.9 F (36.6 C), resp. rate 20, height 4\' 11"  (1.499 m), weight 64.9 kg, SpO2 95 %.  Constitutional: appears sl uncomfortable.  Vital signs reviewed. HEENT: EOMI, oral membranes moist Neck: supple Cardiovascular: IRR with murmur. No JVD    Respiratory/Chest: CTA Bilaterally without wheezes or rales. Normal effort    GI/Abdomen: BS +, non-tender, non-distended Ext: no clubbing, cyanosis, or edema Psych: pleasant and cooperative, sl anxious Skin: Clean and intact without signs of breakdown Neuro: Pt is cognitively appropriate with normal insight, memory, and awareness. Cranial nerves 2-12 are intact. Sensory exam is normal. Reflexes are 2+ in all 4's. Fine motor coordination is intact. No tremors. Motor function is grossly 5/5 RUE and RLE. LUE 4/5, LLE 4/5 prox to distal. No gross limb ataxia.  Musculoskeletal: Full ROM, No pain with AROM or PROM in the neck, trunk, or extremities. Posture appropriate. No increase in chest pain with palpation or deep breathing       Assessment/Plan: 1. Functional deficits which require 3+ hours per day of interdisciplinary therapy in a comprehensive inpatient rehab setting.  Physiatrist is providing close team supervision and 24 hour management of active medical problems listed below.  Physiatrist and rehab team continue to assess barriers to discharge/monitor  patient progress toward functional and medical goals  Care Tool:  Bathing    Body parts bathed by patient: Right arm,Left arm,Chest,Abdomen,Front perineal area,Right upper  leg,Left upper leg,Right lower leg,Left lower leg,Face   Body parts bathed by helper: Buttocks     Bathing assist Assist Level: Minimal Assistance - Patient > 75%     Upper Body Dressing/Undressing Upper body dressing   What is the patient wearing?: Pull over shirt    Upper body assist Assist Level: Minimal Assistance - Patient > 75%    Lower Body Dressing/Undressing Lower body dressing      What is the patient wearing?: Pants,Incontinence brief     Lower body assist Assist for lower body dressing: Moderate Assistance - Patient 50 - 74%     Toileting Toileting    Toileting assist Assist for toileting: Maximal Assistance - Patient 25 - 49%     Transfers Chair/bed transfer  Transfers assist     Chair/bed transfer assist level: Minimal Assistance - Patient > 75%     Locomotion Ambulation   Ambulation assist      Assist level: Minimal Assistance - Patient > 75% Assistive device: Hand held assist Max distance: 65ft   Walk 10 feet activity   Assist     Assist level: Minimal Assistance - Patient > 75% Assistive device: Hand held assist   Walk 50 feet activity   Assist Walk 50 feet with 2 turns activity did not occur: Safety/medical concerns         Walk 150 feet activity   Assist Walk 150 feet activity did not occur: Safety/medical concerns         Walk 10 feet on uneven surface  activity   Assist Walk 10 feet on uneven surfaces activity did not occur: Safety/medical concerns         Wheelchair     Assist Will patient use wheelchair at discharge?: No             Wheelchair 50 feet with 2 turns activity    Assist            Wheelchair 150 feet activity     Assist          Blood pressure 119/84, pulse 93, temperature 97.9 F (36.6 C), resp. rate 20, height 4\' 11"  (1.499 m), weight 64.9 kg, SpO2 95 %.  Medical Problem List and Plan: 1. Truncal ataxia with headache nausea and vomiting secondary to  cerebellar vermis hemorrhage likely due to Coumadin coagulopathy             -patient may shower             -ELOS/Goals: 10-15 days/supervision/mod I             - continue therapies as tolerated 2.  Antithrombotics: -DVT/anticoagulation: Intravenous heparin--ultimate resumption of coumadin will be up to neurology/cardiology. Head CT does demonstrate interval improvement in cerebellar hemorrhage and edema.              -antiplatelet therapy: N/A 3. Pain Management: Tylenol as needed 4. Mood: Provide emotional support             -antipsychotic agents: N/A 5. Neuropsych: This patient is capable of making decisions on her own behalf. 6. Skin/Wound Care: Routine skin checks 7. Fluids/Electrolytes/Nutrition: Routine in and outs             CMP ordered for monday 8. Headaches. Depakote DC'd  Tylenol as needed 9. 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 Follow-up cardiology services, neuro as above             HR in 90's consistently  1/9 given CP this morning, ordered EKG and ckmb, troponin   -EKG demonstrates A fib around 90 bpm   -VS otherwise ok, O2 sat 95% at 0730 10. Diabetes mellitus with hyperglycemia. Hemoglobin A1c 7.5. SSI. Patient on Glucophage 500 mg twice daily prior to admission. Resume as needed             1/9 amaryl 1mg  daily initiated yesterday to better control CBG's   -no glucophage d/t Cr and persistent nausea from ICH 11. Hypertension.  Imdur 30 mg daily, Demadex 80 mg morning 60 mg nightly, metoprolol-XL 50 mg daily.              1/9 bp controlled 12.  Hyperlipidemia: Crestor 13. Obesity. BMI 30.81. Dietary follow-up    LOS: 2 days A FACE TO FACE EVALUATION WAS PERFORMED  3/9 09/04/2020, 8:25 AM

## 2020-09-04 NOTE — Progress Notes (Signed)
ANTICOAGULATION CONSULT NOTE  Pharmacy Consult for heparin Indication: Mechanical mitral valve  Allergies  Allergen Reactions  . Promethazine Hcl Other (See Comments)    REACTION: made her real shaky    Patient Measurements: Height: 4\' 11"  (149.9 cm) Weight: 64.9 kg (143 lb 1.3 oz) IBW/kg (Calculated) : 43.2 Heparin Dosing Weight: 69.2 kg   Vital Signs: Temp: 97.9 F (36.6 C) (01/09 0355) BP: 119/84 (01/09 0743) Pulse Rate: 93 (01/09 0743)  Labs: Recent Labs    09/08/2020 0131 09/03/20 0514 09/04/20 0520  HGB  --   --  13.5  HCT  --   --  40.5  PLT  --   --  152  HEPARINUNFRC 0.30 0.55 0.44  CREATININE 1.67*  --   --     Estimated Creatinine Clearance: 26.8 mL/min (A) (by C-G formula based on SCr of 1.67 mg/dL (H)).  Assessment: 42 YOF on warfarin at home for h/o mechanical mitral valve presented to the ED on 12/29 with cerebellar vermis hemorrhage. Warfarin was reversed with 22F-PCC and vitamin K and anticoagulation has been on hold since. Pharmacy consulted to resume IV heparin per stroke protocol.  Heparin level remains therapeutic at 0.44 on 750 units/hr. Plan is to resume warfarin after 2 wks from hemorrhage. H/H and plt wnl, no bleeding noted.   Goal of Therapy:  Heparin level 0.3-0.5 units/ml Monitor platelets by anticoagulation protocol: Yes   Plan:  -Continue IV heparin at 750 units/hr -Daily heparin level, CBC -Monitor for s/sx of bleeding  Thank you for involving pharmacy in this patient's care.  1/30, PharmD PGY1 Pharmacy Resident 09/04/2020 8:37 AM  Please check [email or web address] for unit-specific pharmacy phone numbers.

## 2020-09-05 ENCOUNTER — Inpatient Hospital Stay (HOSPITAL_COMMUNITY): Payer: Medicare Other

## 2020-09-05 ENCOUNTER — Inpatient Hospital Stay (HOSPITAL_COMMUNITY): Payer: Medicare Other | Admitting: Occupational Therapy

## 2020-09-05 DIAGNOSIS — I10 Essential (primary) hypertension: Secondary | ICD-10-CM

## 2020-09-05 DIAGNOSIS — E1165 Type 2 diabetes mellitus with hyperglycemia: Secondary | ICD-10-CM

## 2020-09-05 DIAGNOSIS — K219 Gastro-esophageal reflux disease without esophagitis: Secondary | ICD-10-CM

## 2020-09-05 DIAGNOSIS — N179 Acute kidney failure, unspecified: Secondary | ICD-10-CM

## 2020-09-05 DIAGNOSIS — G479 Sleep disorder, unspecified: Secondary | ICD-10-CM

## 2020-09-05 LAB — GLUCOSE, CAPILLARY
Glucose-Capillary: 174 mg/dL — ABNORMAL HIGH (ref 70–99)
Glucose-Capillary: 193 mg/dL — ABNORMAL HIGH (ref 70–99)
Glucose-Capillary: 229 mg/dL — ABNORMAL HIGH (ref 70–99)
Glucose-Capillary: 268 mg/dL — ABNORMAL HIGH (ref 70–99)

## 2020-09-05 LAB — CBC WITH DIFFERENTIAL/PLATELET
Abs Immature Granulocytes: 0.22 10*3/uL — ABNORMAL HIGH (ref 0.00–0.07)
Basophils Absolute: 0.1 10*3/uL (ref 0.0–0.1)
Basophils Relative: 1 %
Eosinophils Absolute: 0.7 10*3/uL — ABNORMAL HIGH (ref 0.0–0.5)
Eosinophils Relative: 7 %
HCT: 41.3 % (ref 36.0–46.0)
Hemoglobin: 13 g/dL (ref 12.0–15.0)
Immature Granulocytes: 2 %
Lymphocytes Relative: 27 %
Lymphs Abs: 2.6 10*3/uL (ref 0.7–4.0)
MCH: 23.3 pg — ABNORMAL LOW (ref 26.0–34.0)
MCHC: 31.5 g/dL (ref 30.0–36.0)
MCV: 74.1 fL — ABNORMAL LOW (ref 80.0–100.0)
Monocytes Absolute: 1.1 10*3/uL — ABNORMAL HIGH (ref 0.1–1.0)
Monocytes Relative: 11 %
Neutro Abs: 5.1 10*3/uL (ref 1.7–7.7)
Neutrophils Relative %: 52 %
Platelets: 155 10*3/uL (ref 150–400)
RBC: 5.57 MIL/uL — ABNORMAL HIGH (ref 3.87–5.11)
RDW: 16 % — ABNORMAL HIGH (ref 11.5–15.5)
WBC: 9.8 10*3/uL (ref 4.0–10.5)
nRBC: 0 % (ref 0.0–0.2)

## 2020-09-05 LAB — COMPREHENSIVE METABOLIC PANEL
ALT: 29 U/L (ref 0–44)
AST: 37 U/L (ref 15–41)
Albumin: 3.6 g/dL (ref 3.5–5.0)
Alkaline Phosphatase: 66 U/L (ref 38–126)
Anion gap: 13 (ref 5–15)
BUN: 40 mg/dL — ABNORMAL HIGH (ref 8–23)
CO2: 20 mmol/L — ABNORMAL LOW (ref 22–32)
Calcium: 9.8 mg/dL (ref 8.9–10.3)
Chloride: 102 mmol/L (ref 98–111)
Creatinine, Ser: 1.74 mg/dL — ABNORMAL HIGH (ref 0.44–1.00)
GFR, Estimated: 32 mL/min — ABNORMAL LOW (ref >60)
Glucose, Bld: 170 mg/dL — ABNORMAL HIGH (ref 70–99)
Potassium: 4.5 mmol/L (ref 3.5–5.1)
Sodium: 135 mmol/L (ref 135–145)
Total Bilirubin: 1.3 mg/dL — ABNORMAL HIGH (ref 0.3–1.2)
Total Protein: 7.4 g/dL (ref 6.5–8.1)

## 2020-09-05 LAB — TROPONIN I (HIGH SENSITIVITY): Troponin I (High Sensitivity): 50 ng/L — ABNORMAL HIGH (ref <18)

## 2020-09-05 LAB — HEPARIN LEVEL (UNFRACTIONATED): Heparin Unfractionated: 0.4 IU/mL (ref 0.30–0.70)

## 2020-09-05 MED ORDER — PANTOPRAZOLE SODIUM 40 MG PO TBEC
40.0000 mg | DELAYED_RELEASE_TABLET | Freq: Two times a day (BID) | ORAL | Status: DC
  Administered 2020-09-05 – 2020-09-06 (×3): 40 mg via ORAL
  Filled 2020-09-05 (×4): qty 1

## 2020-09-05 MED ORDER — MELATONIN 3 MG PO TABS
1.5000 mg | ORAL_TABLET | Freq: Every day | ORAL | Status: DC
  Administered 2020-09-05: 1.5 mg via ORAL
  Filled 2020-09-05: qty 1

## 2020-09-05 NOTE — Progress Notes (Signed)
Occupational Therapy Session Note  Patient Details  Name: Deborah [name] MRN: [id number] Date of Birth: 1951-04-30  Today's Date: 09/05/2020 OT Individual Time: 1430-1530 OT Individual Time Calculation (min): 60 min    Short Term Goals: Week 1:  OT Short Term Goal 1 (Week 1): Pt will complete 2/3 steps of toileting CGA OT Short Term Goal 2 (Week 1): Pt will advance pants past hips with CGA OT Short Term Goal 3 (Week 1): Pt will stand to groom to demo improved activity tolerace OT Short Term Goal 4 (Week 1): Pt will transfer to TTB with CGA  Skilled Therapeutic Interventions/Progress Updates:    Patient in bed, alert with minimal stimulation - she states that she is tired as she did not sleep well last night but is willing to participate at this time.  Daughter present for session, IV running.   Supine to sitting edge of bed with CGA and slow rate.  SPT to w/c with CGA.  Reviewed and practiced tub transfer bench in tub/shower - discussed DME options - daughter to purchase tub bench she demonstrates good understanding of adjustment and set up.  Patient able to complete shower transfer with CGA and slow rate.  Discussed toilet vs commode in home setting - patient able to transfer safely to/from hospital height toilet without rails/grab bars with CGA.  Daughter to measure toilet at home, discussed rationale for having a 3in1 commode for bedside option as well.  Patient able to complete toileting with min A for pants up.  She ambulates in room with RW CGA.  Sit to supine with CGA/min a.  Reviewed and practiced visual motor exercises (OS, OD, OU) in supine position.  Patient continues with left eye nystagmus and gaze stabilization difficulty, difficulty and slow rate for VOR, accomodation, saccades.....   Patient and daughter demonstrate good understanding and carryover.  She remained in bed at close of session, bed alarm set and call bell in reach.    Therapy Documentation Precautions:   Precautions Precautions: Fall,Other (comment) Precaution Comments: dizziness/nausea with mobility, try using gaze stabilization, monitor HR Restrictions Weight Bearing Restrictions: No   Therapy/Group: Individual Therapy  Barrie Lyme 09/05/2020, 7:48 AM

## 2020-09-05 NOTE — Plan of Care (Signed)
Called by CIR team regarding the timing for initiation of coumadin. Per report, pt has been doing well. Repeat CT head yesterday showed continued interval evolution of ICH centered at cerebellar vermis, overly slightly decreased in size and less dense as compared with previous exams.  Slightly decreased surrounding edema.  No hydrocephalus.  According to Dr. Marlis Edelson note on 09/07/2020 "Plan restart warfarin after 2 weeks of hemorrhage and not continue aspirin long-term". Currently pt still on heparin IV which started on 08/29/20. Pt admitted on 08/24/20.  Pt currently doing well with improving neuro imaging. Would recommend to restart coumadin Wednesday 1/12 and allow INR up to target in 4-5 days. Once INR at goal of 2.5-3.0, heparin IV can be discontinued. Pt will follow up with Dr. Pearlean Brownie in 3-4 weeks at [facility] after discharge.   Marvel Plan, MD PhD Stroke Neurology 09/05/2020 5:55 PM

## 2020-09-05 NOTE — Progress Notes (Signed)
Physical Therapy Session Note  Patient Details  Name: Deborah [name] MRN: [id number] Date of Birth: 1951-08-24  Today's Date: 09/05/2020 PT Individual Time: 0900-0959 PT Individual Time Calculation (min): 59 min   Short Term Goals: Week 1:  PT Short Term Goal 1 (Week 1): Pt will perform supine<>sit with min assist consistently (not using bed features) PT Short Term Goal 2 (Week 1): Pt will perform sit<>stands using LRAD with CGA PT Short Term Goal 3 (Week 1): Pt will perform bed<>chair transfers using LRAD with CGA PT Short Term Goal 4 (Week 1): Pt will ambulate at least 77ft using LRAD with CGA PT Short Term Goal 5 (Week 1): Pt will ascend/descend 4 steps using HRs with CGA  Skilled Therapeutic Interventions/Progress Updates:   Received pt sitting in [facility] with daughter present at bedside, pt agreeable to therapy, and denied any pain during session but reported feeing dizzy and fatigued. Session with emphasis on functional mobility/transfers, generalized strengthening, dynamic standing balance/coordination, NMR, and improved activity tolerance. BP sitting in WC: 133/107. Pt transferred sit<>stand with min A and BP standing: 126/101; but pt asymptomatic. Pt's daughter reported pt recently took BP medication about 10 minutes prior to therapy session. Pt transported to therapy gym in [facility] total A and transferred WC<>mat stand<>pivot without AD and min A. Pt performed BERG with significantly increased time and interruptions due to reports of dizziness, nausea, and RN present to change IV. BERG score 11/56. Pt educated on test results and significance indicating high fall risk. Pt verbalized understanding and educated pt's daughter on test results/significance at end of session. Pt transferred mat<>WC stand<>pivot without AD and min A with cues for turning technique. Pt performed the following exercises sitting in WC with back support due to complaints of trunk feeling "wobbly" with supervision and verbal  cues for technique: -LAQ x10 bilaterally - hip flexion x10 bilaterally  - hip adductoin ball squeezes 2x10 Pt transported back to room in [facility] total A and requested to use restroom. Pt ambulated 2ft x 2 trials with RW and CGA to/from bathroom and required max A from pt's daughter for clothing management. Therapist educated pt's daughter on hand placement when assisting with transfers and short distance gait as pt prefers to have her daughter assist with toileting. Pt able to void but required max A from her daughter for peri-care. Pt transferred sit<>supine with supervision. Concluded session with pt supine in bed, needs within reach, and bed alarm on. Daughter present at bedside.   Therapy Documentation Precautions:  Precautions Precautions: Fall,Other (comment) Precaution Comments: dizziness/nausea with mobility, try using gaze stabilization, monitor HR Restrictions Weight Bearing Restrictions: No  Therapy/Group: Individual Therapy Martin Majestic PT, DPT   09/05/2020, 7:21 AM

## 2020-09-05 NOTE — Progress Notes (Signed)
Inpatient Rehabilitation Care Coordinator Assessment and Plan Patient Details  Name: Deborah [name] MRN: [id number] Date of Birth: 1951-02-05  Today's Date: 09/05/2020  Hospital Problems: Principal Problem:   Cerebellar stroke [facility]) Active Problems:   Intraparenchymal hemorrhage of brain [facility])  Past Medical History:  Past Medical History:  Diagnosis Date  . Allergic rhinitis   . Anemia   . Borderline diabetes   . Carpal tunnel syndrome   . Chronic atrial fibrillation (HCC)    coumadin managed by [facility]. CVRR;  Event montor (7/12-8/12) showed no high rate episodes (patient continuously in atrial fibrillation).   . Chronic diastolic heart failure (HCC)   . Eosinophilia 01/27/2013  . External hemorrhoid   . External hemorrhoids   . Fatigue   . Gout   . 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 Gaylyn Rong  . Hx of cardiovascular stress test    ETT-myoview (7/12): No evidence for ischemia or infarction  . Hyperlipidemia   . Hypertension   . Obesity   . 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 Rotator cuff syndrome    Past Surgical History:  Past Surgical History:  Procedure Laterality Date  . CARDIAC CATHETERIZATION     EF 55%  . TRICUSPID VALVE REPAIR     Social History:  reports that she has never smoked. She has never used smokeless tobacco. She reports that she does not drink alcohol and does not use drugs.  Family / Support Systems Marital Status: Married Patient Roles: Spouse,Parent Spouse/Significant Other: [phone] Children: Gaspar Bidding [phone]  [phone] Other Supports:  Daniel-son [phone]-cell Anticipated Caregiver: TIna Ability/Limitations of Caregiver: Min level Caregiver Availability: 24/7 Family Dynamics: Close knit family who look after one another, pt has always been independent and never had to depend upon others she does not like this. She hopes to be mod/i by discharge.  Social History Preferred language: English Religion: Christian Cultural Background: [place] speaks limited English Education: Some in [place] Read: Yes (native language) Write: Yes (native language) Employment Status: Retired Marine scientist Issues: No issues Guardian/Conservator: None-according to MD pt is capable of making her own decisions while here. Daughter plans to be here daily and will assist with any decision and for language barrier   Abuse/Neglect Abuse/Neglect Assessment Can Be Completed: Yes Physical Abuse: Denies Verbal Abuse: Denies Sexual Abuse: Denies Exploitation of patient/patient's resources: Denies Self-Neglect: Denies  Emotional Status Pt's affect, behavior and adjustment status: Pt is motivated to do well here and regain her independence. She was active and walked daily and took care of others. She is not one to sit still and doesn't like it having to call for assist. Recent Psychosocial Issues: other health issues was managing prior to admission Psychiatric History: NO issues-deferred depression screen due to seems to be coping appropriately at this time. Will continue to monitor and see if would benefit while here Substance Abuse History: None  Patient / Family Perceptions, Expectations & Goals Pt/Family understanding of illness & functional limitations: Pt and daughter who is present can explain her stroke and deficits. She is feeling better but still having some dizziness, which  she feels limits her. Both have spoken with the MD and feel they understand the treatment process going forward. Premorbid pt/family roles/activities:  wife, mother, gradnmother, retiree, walker, etc Anticipated changes in roles/activities/participation: resume Pt/family expectations/goals: Pt states: " I want to do for myself and not have someone help me."  Daughter states: " I hope she can get back to walking she wants to do for herself and is not one to ask for help."  [employer]: None Premorbid Home Care/DME Agencies: None Transportation available at discharge: Family pt did not drive prior to admission  Discharge Planning Living Arrangements: Spouse/significant other,Children Support Systems: Spouse/significant other,Children,Other relatives,Friends/neighbors Type of Residence: Private residence Community education officer Resources: [employer] (specify county) Surveyor, quantity Resources: Family Support Financial Screen Referred: No Living Expenses: Lives with family Money Management: Family,Significant Other Does the patient have any problems obtaining your medications?: No Home Management: Family pt helped with this prior to admission Patient/Family Preliminary Plans: Return home with daughter, spous and daughter's family. Daughter will work on care for when they are working. She is aware pt will need 24/7 care when first goes home. Have given her the private duty list. Aware 2 week length of stay here. Care Coordinator Barriers to Discharge: Other (comments) Care Coordinator Barriers to Discharge Comments: language barrier needs interpreter Care Coordinator Anticipated Follow Up Needs: HH/OP  Clinical Impression Pleasant vietanamse female who is willing to work hare and try to regain her function while here. She was very active and walked daily prior to admission. Her family is very involved and duaghteris here most of the time. She seems to need an interpreter due to [place] is limited. Will get input form therapy team. Will work on discharge needs.   Lucy Chris 09/05/2020, 11:17 AM

## 2020-09-05 NOTE — Progress Notes (Signed)
Physical Therapy Session Note  Patient Details  Name: Deborah [name] MRN: [id number] Date of Birth: Apr 14, 1951  Today's Date: 09/05/2020 PT Individual Time: 1100-1158 PT Individual Time Calculation (min): 58 min   Short Term Goals: Week 1:  PT Short Term Goal 1 (Week 1): Pt will perform supine<>sit with min assist consistently (not using bed features) PT Short Term Goal 2 (Week 1): Pt will perform sit<>stands using LRAD with CGA PT Short Term Goal 3 (Week 1): Pt will perform bed<>chair transfers using LRAD with CGA PT Short Term Goal 4 (Week 1): Pt will ambulate at least 22ft using LRAD with CGA PT Short Term Goal 5 (Week 1): Pt will ascend/descend 4 steps using HRs with CGA  Skilled Therapeutic Interventions/Progress Updates:    Pt greeted resting in bed, daughter at bedside, pt agreeable to PT session. She reports no pain but does c/o general fatigue from prior PT session. Daughter supportive throughout and assisted with providing words of encouragement. Supine<>sit with CGA with HOB elevated. She required ++ time for upright acclimation due to dizziness with positional changes, no nystagmus noted. Forward scooting at EOB with CGA and performed stand<>pivot transfer with minA and no AD from EOB to w/c. Pt on Heparin drip so unable to saline lock IV.   W/c transport from her room to main hallway for gait training. Daughter present to assist with IV pole management. She ambulated 57ft + 21ft with CGA and RW on level surfaces. With initial gait trial, gait deficits include narrow BOS, forward flexed trunk, downward gaze, and decreased gait speed. Seated rest break 2/2 fatigue and then therapist provided verbal cues for correcting gait deficits and using visual stabilization with distant target to assist with reducing dizziness spells. 2nd gait trial she demonstrated improved efficiency and improved corrections. Gait distance limited by fatigue and dizziness.  Pt unable to tolerate further gait  training so she performed a few seated exercises shldr press, chest press, and bicep curls with 3# dowel rod. She also completed repeated, 2x5, sit<>stands with minA and no AD. She completed the session with standing balance on blue air-ex foam pad with minA guard and feet apart. She show's posterior tendencies with this but was able to correct with TC's. She also performed functional reaching with RUE on blue air-ex, requiring min/modA for standing balance.  Pt was returned to her room in her w/c with totalA for energy conservation and she remained seated in w/c with needs in reach and daughter at bedside at end of session. Pt and daughter pleased with progress made during today's therapy.  Therapy Documentation Precautions:  Precautions Precautions: Fall,Other (comment) Precaution Comments: dizziness/nausea with mobility, try using gaze stabilization, monitor HR Restrictions Weight Bearing Restrictions: No  Therapy/Group: Individual Therapy  Orvella Digiulio P Syretta Kochel PT 09/05/2020, 12:17 PM

## 2020-09-05 NOTE — Progress Notes (Signed)
Inpatient Rehabilitation Center Individual Statement of Services  Patient Name:  Deborah [name]  Date:  09/05/2020  Welcome to the Inpatient Rehabilitation Center.  Our goal is to provide you with an individualized program based on your diagnosis and situation, designed to meet your specific needs.  With this comprehensive rehabilitation program, you will be expected to participate in at least 3 hours of rehabilitation therapies Monday-Friday, with modified therapy programming on the weekends.  Your rehabilitation program will include the following services:  Physical Therapy (PT), Occupational Therapy (OT), 24 hour per day rehabilitation nursing, Care Coordinator, Rehabilitation Medicine, Nutrition Services and Pharmacy Services  Weekly team conferences will be held on Wednesday to discuss your progress.  Your Inpatient Rehabilitation Care Coordinator will talk with you frequently to get your input and to update you on team discussions.  Team conferences with you and your family in attendance may also be held.  Expected length of stay: 10-14 days  Overall anticipated outcome: supervision with cueing  Depending on your progress and recovery, your program may change. Your Inpatient Rehabilitation Care Coordinator will coordinate services and will keep you informed of any changes. Your Inpatient Rehabilitation Care Coordinator's name and contact numbers are listed  below.  The following services may also be recommended but are not provided by the Inpatient Rehabilitation Center:  Home Health Rehabiltiation Services  Outpatient Rehabilitation Services    Arrangements will be made to provide these services after discharge if needed.  Arrangements include referral to agencies that provide these services.  Your insurance has been verified to be: Medicare & Medicaid  Your primary doctor is:  Jamelle Rushing  Pertinent information will be shared with your doctor and your insurance  company.  Inpatient Rehabilitation Care Coordinator:  Dossie Der, [place] [phone] or [phone]  Information discussed with and copy given to patient by: Lucy Chris, 09/05/2020, 10:20 AM

## 2020-09-05 NOTE — Progress Notes (Signed)
ANTICOAGULATION CONSULT NOTE  Pharmacy Consult for heparin Indication: Mechanical mitral valve and tricuspid valve  Allergies  Allergen Reactions  . Promethazine Hcl Other (See Comments)    REACTION: made her real shaky    Patient Measurements: Height: 4\' 11"  (149.9 cm) Weight: 62.2 kg (137 lb 2 oz) IBW/kg (Calculated) : 43.2 Heparin Dosing Weight: 69.2 kg   Vital Signs: Temp: 98 F (36.7 C) (01/10 0518) Temp Source: Oral (01/10 0518) BP: 124/72 (01/10 0829) Pulse Rate: 81 (01/10 0829)  Labs: Recent Labs    09/03/20 0514 09/04/20 0520 09/04/20 0805 09/04/20 1017 09/05/20 0616  HGB  --  13.5  --   --  13.0  HCT  --  40.5  --   --  41.3  PLT  --  152  --   --  155  HEPARINUNFRC 0.55 0.44  --   --  0.40  CREATININE  --   --  1.64*  --  1.74*  CKTOTAL  --   --  31*  --   --   CKMB  --   --  0.8  --   --   TROPONINIHS  --   --  14 25*  --     Estimated Creatinine Clearance: 25.2 mL/min (A) (by C-G formula based on SCr of 1.74 mg/dL (H)).  Assessment: Deborah [name] on warfarin at home for h/o mechanical mitral valve presented to the ED on 12/29 with cerebellar vermis hemorrhage. Warfarin was reversed with 17F-PCC and vitamin K and anticoagulation has been on hold since. Pharmacy consulted to resume IV heparin per stroke protocol.  Heparin level remains therapeutic at 0.4 on 750 units/hr. Plan is to resume warfarin after 2 wks from hemorrhage. H/H and plt wnl, no bleeding noted.   Goal of Therapy:  Heparin level 0.3-0.5 units/ml Monitor platelets by anticoagulation protocol: Yes   Plan:  -Continue IV heparin at 750 units/hr -Daily heparin level and CBC -Monitor for s/sx of bleeding  Thank you for involving pharmacy in this patient's care.  1/30, Pharm.D., BCPS Clinical Pharmacist  **Pharmacist phone directory can be found on [email or web address] listed under [facility].  09/05/2020 1:03 PM

## 2020-09-05 NOTE — Progress Notes (Signed)
Inpatient Rehabilitation  Patient information reviewed and entered into eRehab system by Yoselin Amerman M. Kiera Hussey, M.A., CCC/SLP, PPS Coordinator.  Information including medical coding, functional ability and quality indicators will be reviewed and updated through discharge.    

## 2020-09-05 NOTE — Progress Notes (Signed)
This RN received call form lab about increased in Troponin level, PA notified about result but no new order given. We continue to [facility]

## 2020-09-05 NOTE — Progress Notes (Signed)
Deborah [name]   Subjective/Complaints: Patient seen sitting up in her chair this AM.  She states she slept fairly overnight as a result of being in the hospital.  She notes central and left sided CP, reproducible in areas.  She had workup that was unremarkable.  She does [name] being on less PPI than at home. Daughter with questions regarding orthostatic like symptoms.   ROS: +CP.  Denies SOB, N/V/D  Objective:   CT HEAD WO CONTRAST  Result Date: 09/04/2020 CLINICAL DATA:  Follow-up examination for acute stroke. EXAM: CT HEAD WITHOUT CONTRAST TECHNIQUE: Contiguous axial images were obtained from the base of the skull through the vertex without intravenous contrast. COMPARISON:  Prior head CT from 08/31/2020 FINDINGS: Brain: Continued interval evolution of parenchymal hemorrhage centered at the cerebellar vermis, overall slightly decreased in size and less dense as compared to previous exam. Similar to perhaps slightly decreased surrounding edema. Adjacent fourth ventricle remains widely patent. No hydrocephalus. No other new intracranial hemorrhage or large vessel territory infarct. Chronic microvascular ischemic disease again noted. No midline shift or extra-axial fluid collection. Vascular: No hyperdense vessel. Scattered vascular calcifications noted within the carotid siphons. Skull: Scalp soft tissues and calvarium are stable in appearance. No new finding. Sinuses/Orbits: Globes and orbital soft tissues demonstrate no acute finding. Paranasal sinuses and mastoid air cells remain clear. Other: None. IMPRESSION: 1. Continued interval evolution of parenchymal hemorrhage centered at the cerebellar vermis, overall slightly decreased in size and less dense as compared to previous exam. Slightly decreased surrounding edema. No hydrocephalus. 2. No other new acute intracranial abnormality. Electronically Signed   By: Rise Mu M.D.   On: 09/04/2020  02:08   Recent Labs    09/04/20 0520 09/05/20 0616  WBC 9.5 9.8  HGB 13.5 13.0  HCT 40.5 41.3  PLT 152 155   Recent Labs    09/04/20 0805 09/05/20 0616  NA 133* 135  K 3.5 4.5  CL 99 102  CO2 21* 20*  GLUCOSE 265* 170*  BUN 40* 40*  CREATININE 1.64* 1.74*  CALCIUM 9.7 9.8    Intake/Output Summary (Last 24 hours) at 09/05/2020 0929 Last data filed at 09/05/2020 0700 Gross per 24 hour  Intake 670 ml  Output -  Net 670 ml        Physical Exam: Vital Signs Blood pressure 124/72, pulse 81, temperature 98 F (36.7 C), temperature source Oral, resp. rate 16, height 4\' 11"  (1.499 m), weight 62.2 kg, SpO2 96 %. Constitutional: No distress . Vital signs reviewed. HENT: Normocephalic.  Atraumatic. Eyes: EOMI. No discharge. Cardiovascular: No JVD.  Irregularly irregular.  Respiratory: Normal effort.  No stridor.  Bilateral clear to auscultation. GI: Non-distended.  BS +. Skin: Warm and dry.  Intact. Psych: Flat. Normal behavior. Musc: No edema in extremities.  No tenderness in extremities. Mild TTP sternum Neuro: Alert Motor: RUE/RLE: 5/5 proximal to distal LUE: 4+/5 proximal to distal 4+/5 proximal to distal  Assessment/Plan: 1. Functional deficits which require 3+ hours per day of interdisciplinary therapy in a comprehensive inpatient rehab setting.  Physiatrist is providing close team supervision and 24 hour management of active medical problems listed below.  Physiatrist and rehab team continue to assess barriers to discharge/monitor patient progress toward functional and medical goals  Care Tool:  Bathing    Body parts bathed by patient: Right arm,Left arm,Chest,Abdomen,Front perineal area,Right upper leg,Left upper leg,Right lower leg,Left lower leg,Face   Body parts bathed by helper: Buttocks  Bathing assist Assist Level: Minimal Assistance - Patient > 75%     Upper Body Dressing/Undressing Upper body dressing   What is the patient wearing?: Pull  over shirt    Upper body assist Assist Level: Minimal Assistance - Patient > 75%    Lower Body Dressing/Undressing Lower body dressing      What is the patient wearing?: Pants,Incontinence brief     Lower body assist Assist for lower body dressing: Moderate Assistance - Patient 50 - 74%     Toileting Toileting    Toileting assist Assist for toileting: Moderate Assistance - Patient 50 - 74%     Transfers Chair/bed transfer  Transfers assist     Chair/bed transfer assist level: Moderate Assistance - Patient 50 - 74%     Locomotion Ambulation   Ambulation assist      Assist level: Minimal Assistance - Patient > 75% Assistive device: Hand held assist Max distance: 74ft   Walk 10 feet activity   Assist     Assist level: Minimal Assistance - Patient > 75% Assistive device: Hand held assist   Walk 50 feet activity   Assist Walk 50 feet with 2 turns activity did not occur: Safety/medical concerns         Walk 150 feet activity   Assist Walk 150 feet activity did not occur: Safety/medical concerns         Walk 10 feet on uneven surface  activity   Assist Walk 10 feet on uneven surfaces activity did not occur: Safety/medical concerns         Wheelchair     Assist Will patient use wheelchair at discharge?: No             Wheelchair 50 feet with 2 turns activity    Assist            Wheelchair 150 feet activity     Assist         Medical Problem List and Plan: 1. 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  Antithrombotics:  Cont heparin ggt   WNL on 1/10        -antiplatelet therapy: see above 3. Pain Management: Tylenol as needed 4. Mood: Provide emotional support             -antipsychotic agents: N/A 5. Neuropsych: This patient is capable of making decisions on her own behalf. 6. Skin/Wound Care: Routine  skin checks 7. Fluids/Electrolytes/Nutrition: Routine in and outs 8. Headaches. Depakote DC'd             Tylenol as needed 9. 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   Follow-up cardiology services, neuro   EKG showing Afib  Ckmb, troponin initially unremarkable, later elevated, will repeat 10. Diabetes mellitus with hyperglycemia. Hemoglobin A1c 7.5. SSI. Patient on Glucophage 500 mg twice daily prior to admission. Resume as needed             Amaryl 1mg  started on 1/8  Glucophage on hold due to Cr   Remains elevated on 1/10, will consider further increase tomorrow if necessary 11. Hypertension.  Imdur 30 mg daily, Demadex 80 mg morning 60 mg nightly, metoprolol-XL 50 mg daily.   Relatively controlled on 1/10 12.  Hyperlipidemia: Crestor 13. Obesity. BMI 30.81. Dietary follow-up 14. AKI on CKD 3b - ?baseline ~1.2  Cr. 1.74 on 1/10, labs ordered for tomorrow  Encouraged  fluids  Will ocnsider IVF 15. GERD  On Protonix 40 qhs, 20 daily, + famotidine PTA  Will increase Protonix to BID on 1/10 16. Sleep disturbance  Melatonin started on 1/10  LOS: 3 days A FACE TO FACE EVALUATION WAS PERFORMED  Teondre Jarosz Karis [name] 09/05/2020, 9:29 AM

## 2020-09-06 ENCOUNTER — Inpatient Hospital Stay (HOSPITAL_COMMUNITY): Payer: Medicare Other | Admitting: Occupational Therapy

## 2020-09-06 ENCOUNTER — Inpatient Hospital Stay (HOSPITAL_COMMUNITY): Payer: Medicare Other

## 2020-09-06 ENCOUNTER — Encounter (HOSPITAL_COMMUNITY)
Admission: RE | Disposition: E | Payer: Self-pay | Source: Intra-hospital | Attending: Physical Medicine & Rehabilitation

## 2020-09-06 ENCOUNTER — Inpatient Hospital Stay (HOSPITAL_COMMUNITY): Payer: Medicare Other | Admitting: Certified Registered Nurse Anesthetist

## 2020-09-06 ENCOUNTER — Inpatient Hospital Stay: Admission: AD | Admit: 2020-09-06 | Payer: Medicare Other | Source: Ambulatory Visit | Admitting: Cardiology

## 2020-09-06 DIAGNOSIS — D696 Thrombocytopenia, unspecified: Secondary | ICD-10-CM

## 2020-09-06 DIAGNOSIS — R0789 Other chest pain: Secondary | ICD-10-CM

## 2020-09-06 DIAGNOSIS — I4891 Unspecified atrial fibrillation: Secondary | ICD-10-CM

## 2020-09-06 DIAGNOSIS — I639 Cerebral infarction, unspecified: Secondary | ICD-10-CM

## 2020-09-06 DIAGNOSIS — I469 Cardiac arrest, cause unspecified: Secondary | ICD-10-CM

## 2020-09-06 DIAGNOSIS — E871 Hypo-osmolality and hyponatremia: Secondary | ICD-10-CM

## 2020-09-06 LAB — BASIC METABOLIC PANEL
Anion gap: 11 (ref 5–15)
BUN: 36 mg/dL — ABNORMAL HIGH (ref 8–23)
CO2: 21 mmol/L — ABNORMAL LOW (ref 22–32)
Calcium: 9.7 mg/dL (ref 8.9–10.3)
Chloride: 102 mmol/L (ref 98–111)
Creatinine, Ser: 1.54 mg/dL — ABNORMAL HIGH (ref 0.44–1.00)
GFR, Estimated: 37 mL/min — ABNORMAL LOW (ref >60)
Glucose, Bld: 180 mg/dL — ABNORMAL HIGH (ref 70–99)
Potassium: 4.4 mmol/L (ref 3.5–5.1)
Sodium: 134 mmol/L — ABNORMAL LOW (ref 135–145)

## 2020-09-06 LAB — CBC
HCT: 38.8 % (ref 36.0–46.0)
Hemoglobin: 12.4 g/dL (ref 12.0–15.0)
MCH: 23.4 pg — ABNORMAL LOW (ref 26.0–34.0)
MCHC: 32 g/dL (ref 30.0–36.0)
MCV: 73.2 fL — ABNORMAL LOW (ref 80.0–100.0)
Platelets: 145 10*3/uL — ABNORMAL LOW (ref 150–400)
RBC: 5.3 MIL/uL — ABNORMAL HIGH (ref 3.87–5.11)
RDW: 15.7 % — ABNORMAL HIGH (ref 11.5–15.5)
WBC: 9.6 10*3/uL (ref 4.0–10.5)
nRBC: 0 % (ref 0.0–0.2)

## 2020-09-06 LAB — TROPONIN I (HIGH SENSITIVITY): Troponin I (High Sensitivity): 87 ng/L — ABNORMAL HIGH (ref <18)

## 2020-09-06 LAB — GLUCOSE, CAPILLARY
Glucose-Capillary: 177 mg/dL — ABNORMAL HIGH (ref 70–99)
Glucose-Capillary: 173 mg/dL — ABNORMAL HIGH (ref 70–99)

## 2020-09-06 LAB — HEPARIN LEVEL (UNFRACTIONATED): Heparin Unfractionated: 0.54 IU/mL (ref 0.30–0.70)

## 2020-09-06 SURGERY — INVASIVE LAB ABORTED CASE

## 2020-09-06 MED ORDER — SUCCINYLCHOLINE CHLORIDE 20 MG/ML IJ SOLN
INTRAMUSCULAR | Status: DC | PRN
  Administered 2020-09-06: 100 mg via INTRAVENOUS

## 2020-09-06 MED ORDER — EPINEPHRINE 1 MG/10ML IJ SOSY
PREFILLED_SYRINGE | INTRAMUSCULAR | Status: AC
  Filled 2020-09-06: qty 20

## 2020-09-06 MED ORDER — EPINEPHRINE 1 MG/10ML IJ SOSY
PREFILLED_SYRINGE | INTRAMUSCULAR | Status: AC
  Filled 2020-09-06: qty 10

## 2020-09-06 MED ORDER — VERAPAMIL HCL 2.5 MG/ML IV SOLN
INTRAVENOUS | Status: AC
  Filled 2020-09-06: qty 2

## 2020-09-06 MED ORDER — SODIUM CHLORIDE 0.9 % IV SOLN: 250.0000 mL | INTRAVENOUS | Status: DC

## 2020-09-06 MED ORDER — ASPIRIN 81 MG PO CHEW
CHEWABLE_TABLET | ORAL | Status: AC
  Filled 2020-09-06: qty 4

## 2020-09-06 MED ORDER — SODIUM BICARBONATE 8.4 % IV SOLN
INTRAVENOUS | Status: AC
  Filled 2020-09-06: qty 50

## 2020-09-06 MED ORDER — HEPARIN (PORCINE) IN NACL 1000-0.9 UT/500ML-% IV SOLN
INTRAVENOUS | Status: AC
  Filled 2020-09-06: qty 1000

## 2020-09-06 MED ORDER — ETOMIDATE 2 MG/ML IV SOLN
INTRAVENOUS | Status: DC | PRN
  Administered 2020-09-06: 8 mg via INTRAVENOUS

## 2020-09-06 MED ORDER — IOHEXOL 350 MG/ML SOLN
INTRAVENOUS | Status: AC
  Filled 2020-09-06: qty 1

## 2020-09-06 MED ORDER — NITROGLYCERIN 0.4 MG SL SUBL
SUBLINGUAL_TABLET | SUBLINGUAL | Status: AC
  Administered 2020-09-06: 0.4 mg
  Filled 2020-09-06: qty 1

## 2020-09-06 MED ORDER — LIDOCAINE HCL (PF) 1 % IJ SOLN
INTRAMUSCULAR | Status: AC
  Filled 2020-09-06: qty 30

## 2020-09-06 MED ORDER — NITROGLYCERIN 1 MG/10 ML FOR IR/CATH LAB
INTRA_ARTERIAL | Status: AC
  Filled 2020-09-06: qty 10

## 2020-09-07 MED FILL — Medication: Qty: 1 | Status: AC

## 2020-09-13 MED FILL — Medication: Qty: 1 | Status: AC

## 2020-09-14 ENCOUNTER — Encounter (HOSPITAL_COMMUNITY): Payer: Self-pay | Admitting: *Deleted

## 2020-09-14 NOTE — Progress Notes (Signed)
Pt's DC completed and signed by Dr [name], Ivor Messier & Dick aware to p/u

## 2020-09-27 NOTE — Progress Notes (Addendum)
[place] PHYSICAL MEDICINE & REHABILITATION PROGRESS NOTE   Subjective/Complaints: Patient seen sitting up in her chair this morning.  Daughter at bedside.  She states she slept well overnight.  She notes improvement in chest pain with chest discomfort now.  She states she drank more than 64 ounces of water yesterday.  Discussed plans for anticoagulation with cardiology, neurology, patient, and daughter.  Patient notes improvement in "dizziness".  Neurology notes reviewed as well- start transition to Coumadin tomorrow.  ROS: Denies CP, SOB, N/V/D  Objective:   No results found. Recent Labs    09/05/20 0616 2020-09-26 0522  WBC 9.8 9.6  HGB 13.0 12.4  HCT 41.3 38.8  PLT 155 145*   Recent Labs    09/05/20 0616 Sep 26, 2020 0522  NA 135 134*  K 4.5 4.4  CL 102 102  CO2 20* 21*  GLUCOSE 170* 180*  BUN 40* 36*  CREATININE 1.74* 1.54*  CALCIUM 9.8 9.7    Intake/Output Summary (Last 24 hours) at September 26, 2020 0838 Last data filed at 09/05/2020 1905 Gross per 24 hour  Intake 820 ml  Output --  Net 820 ml        Physical Exam: Vital Signs Blood pressure 106/75, pulse 71, temperature 97.7 F (36.5 C), temperature source Oral, resp. rate 17, height 4\' 11"  (1.499 m), weight 62.2 kg, SpO2 96 %. Constitutional: No distress . Vital signs reviewed. HENT: Normocephalic.  Atraumatic. Eyes: EOMI. No discharge. Cardiovascular: No JVD.  Irregularly irregular. Respiratory: Normal effort.  No stridor.  Bilateral clear to auscultation. GI: Non-distended.  BS +. Skin: Warm and dry.  Intact. Psych: Flat.  Normal behavior. Musc: No edema in extremities.  No tenderness in extremities. Mild tenderness to palpation at sternum Neuro: Alert Motor: RUE/RLE: 5/5 proximal to distal LUE: 4+/5 proximal to distal 4+/5 proximal to distal, unchanged  Assessment/Plan: 1. Functional deficits which require 3+ hours per day of interdisciplinary therapy in a comprehensive inpatient rehab  setting.  Physiatrist is providing close team supervision and 24 hour management of active medical problems listed below.  Physiatrist and rehab team continue to assess barriers to discharge/monitor patient progress toward functional and medical goals  Care Tool:  Bathing    Body parts bathed by patient: Right arm,Left arm,Chest,Abdomen,Front perineal area,Right upper leg,Left upper leg,Right lower leg,Left lower leg,Face   Body parts bathed by helper: Buttocks     Bathing assist Assist Level: Minimal Assistance - Patient > 75%     Upper Body Dressing/Undressing Upper body dressing   What is the patient wearing?: Pull over shirt    Upper body assist Assist Level: Minimal Assistance - Patient > 75%    Lower Body Dressing/Undressing Lower body dressing      What is the patient wearing?: Pants,Incontinence brief     Lower body assist Assist for lower body dressing: Moderate Assistance - Patient 50 - 74%     Toileting Toileting    Toileting assist Assist for toileting: Minimal Assistance - Patient > 75%     Transfers Chair/bed transfer  Transfers assist     Chair/bed transfer assist level: Minimal Assistance - Patient > 75%     Locomotion Ambulation   Ambulation assist      Assist level: Minimal Assistance - Patient > 75% Assistive device: Hand held assist Max distance: 78ft   Walk 10 feet activity   Assist     Assist level: Minimal Assistance - Patient > 75% Assistive device: Hand held assist   Walk 50 feet activity  Assist Walk 50 feet with 2 turns activity did not occur: Safety/medical concerns         Walk 150 feet activity   Assist Walk 150 feet activity did not occur: Safety/medical concerns         Walk 10 feet on uneven surface  activity   Assist Walk 10 feet on uneven surfaces activity did not occur: Safety/medical concerns         Wheelchair     Assist Will patient use wheelchair at discharge?: No              Wheelchair 50 feet with 2 turns activity    Assist            Wheelchair 150 feet activity     Assist         Medical Problem List and Plan: 1. 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  Antithrombotics:  Cont heparin ggt, plan to begin bridge to Coumadin tomorrow   WNL on 1/11, appreciate pharmacy assistance        -antiplatelet therapy: see above 3. Pain Management: Tylenol as needed 4. Mood: Provide emotional support             -antipsychotic agents: N/A 5. Neuropsych: This patient is capable of making decisions on her own behalf. 6. Skin/Wound Care: Routine skin checks 7. Fluids/Electrolytes/Nutrition: Routine in and outs 8. Headaches. Depakote DC'd             Tylenol as needed 9. 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   Follow-up cardiology services, neuro   EKG showing Afib, repeat ECG showing same  Ckmb, troponin initially unremarkable, later elevated, and again elevated  Discussed with cardiology- atypical chest pain, no further work-up at present 10. Diabetes mellitus with hyperglycemia. Hemoglobin A1c 7.5. SSI. Patient on Glucophage 500 mg twice daily prior to admission. Resume as needed             Amaryl 1mg  started on 1/8  Glucophage on hold due to Cr   Remains elevated on 1/11, will consider further increase tomorrow 11. Hypertension.  Imdur 30 mg daily, Demadex 80 mg morning 60 mg nightly, metoprolol-XL 50 mg daily.   Relatively controlled on 1/11 12.  Hyperlipidemia: Crestor 13. Obesity. BMI 30.81. Dietary follow-up 14. AKI on CKD 3b - ?baseline ~1.2  Cr.  1.54 on 1/11, improving  Continue to encourag fluids 15. GERD  On Protonix 40 qhs, 20 daily, + famotidine PTA  Increased Protonix to BID on 1/10 16. Sleep disturbance  Melatonin started on 1/10 17.  Hyponatremia  Sodium 134 on  1/11  Continue to monitor 18.  Thrombocytopenia  Platelets 145 on 1/11, labs ordered for tomorrow  Continue to monitor  LOS: 4 days A FACE TO FACE EVALUATION WAS PERFORMED  Ioan Landini 3/11 September 07, 2020, 8:38 AM

## 2020-09-27 NOTE — Progress Notes (Signed)
ANTICOAGULATION CONSULT NOTE  Pharmacy Consult for heparin Indication: Mechanical mitral valve and tricuspid valve  Allergies  Allergen Reactions  . Promethazine Hcl Other (See Comments)    REACTION: made her real shaky    Patient Measurements: Height: 4\' 11"  (149.9 cm) Weight: 62.2 kg (137 lb 2 oz) IBW/kg (Calculated) : 43.2 Heparin Dosing Weight: 69.2 kg   Vital Signs: Temp: 97.7 F (36.5 C) (01/11 0642) Temp Source: Oral (01/11 0642) BP: 106/75 (01/11 02-06-1989) Pulse Rate: 71 (01/11 0642)  Labs: Recent Labs    09/04/20 0520 09/04/20 0805 09/04/20 1017 09/05/20 0616 09/05/20 1236 2020/09/11 0522  HGB 13.5  --   --  13.0  --  12.4  HCT 40.5  --   --  41.3  --  38.8  PLT 152  --   --  155  --  145*  HEPARINUNFRC 0.44  --   --  0.40  --  0.54  CREATININE  --  1.64*  --  1.74*  --  1.54*  CKTOTAL  --  31*  --   --   --   --   CKMB  --  0.8  --   --   --   --   TROPONINIHS  --  14 25*  --  50*  --     Estimated Creatinine Clearance: 28.4 mL/min (A) (by C-G formula based on SCr of 1.54 mg/dL (H)).  Assessment: 68 YOF on warfarin at home for h/o mechanical mitral valve presented to the ED on 12/29 with cerebellar vermis hemorrhage. Warfarin was reversed with 73F-PCC and vitamin K and oral anticoagulation has been on hold since. Pharmacy consulted to resume IV heparin per stroke protocol.  Heparin level continues to trend up, now jus slightly above goal.  Will reduce rate.  Noted plans to resume warfarin 1/12 and target INR 2.5-3.  H/H and plt wnl, no bleeding noted.   Goal of Therapy:  Heparin level 0.3-0.5 units/ml Monitor platelets by anticoagulation protocol: Yes   Plan:  -Reduce heparin to 650 units/hr -Daily heparin level and CBC -Order baseline INR for 1/12 -Monitor for s/sx of bleeding  Thank you for involving pharmacy in this patient's care.  3/12, Pharm.D., BCPS Clinical Pharmacist  **Pharmacist phone directory can be found on [email or web address] listed  under [facility].  11-Sep-2020 9:49 AM

## 2020-09-27 NOTE — Progress Notes (Signed)
Physical Therapy Session Note  Patient Details  Name: Deborah [name] MRN: [id number] Date of Birth: March 03, 1951  Today's Date: 2020-09-13 PT Individual Time: 0800-0909 PT Individual Time Calculation (min): 69 min   Short Term Goals: Week 1:  PT Short Term Goal 1 (Week 1): Pt will perform supine<>sit with min assist consistently (not using bed features) PT Short Term Goal 2 (Week 1): Pt will perform sit<>stands using LRAD with CGA PT Short Term Goal 3 (Week 1): Pt will perform bed<>chair transfers using LRAD with CGA PT Short Term Goal 4 (Week 1): Pt will ambulate at least 52ft using LRAD with CGA PT Short Term Goal 5 (Week 1): Pt will ascend/descend 4 steps using HRs with CGA  Skilled Therapeutic Interventions/Progress Updates:   Received pt sitting in [facility] with daughter present at bedside, pt agreeable to therapy, and denied any pain during session but reported fatigue. RN present administering medications and to briefly disconnect IV so pt was able to put on sweater. Session with emphasis on functional mobility/transfers, generalized strengthening, dynamic standing balance/coordination, toileting, ambulation, and improved activity tolerance. Pt ambulated 10ft x 2 trials with RW and CGA to/from bathroom with RW and CGA. Pt able to manage clothing with CGA. Pt able to void and perform peri-care with supervision. Pt transported to therapy gym in [facility] total A for energy conservation purposes and ambulated 97ft x 2 trials with RW and CGA with total A to manage IV pole. Pt reported fatigue after ambulating and required extensive rest breaks in between ambulation bouts. Pt reported not having any stairs to get into home and politely declined practicing stairs this morning. Worked on dynamic standing balance tossing horseshoes with 1 UE support on RW standing on [employer] with CGA for balance x 2 trials with wide BOS and x 1 trial with narrow BOS. Pt with mild truncal ataxia and difficulty looking down to locate  horseshoes due to reports of increased dizziness. Attempted to engage in dynamic standing balance tossing ball against rebounder without UE support however pt unable to move head to track quick movements of the ball due to dizziness; therefore stopped activity. Pt performed the following exercises standing with BUE support on RW and CGA for balance: -marching x10 bilaterally -heel raises 2x10 -hamstring curls x10 bilaterally -hip abduction x10 bilaterally  Pt transferred mat<>WC stand<>pivot with RW and CGA. Pt transported back to room in [facility] total A and requested to rest in bed. Stand<>pivot WC<>bed with RW and CGA and sit<>supine with supervision. Concluded session with pt supine in bed, needs within reach, and bed alarm on. Daughter present at bedside.   Therapy Documentation Precautions:  Precautions Precautions: Fall,Other (comment) Precaution Comments: dizziness/nausea with mobility, try using gaze stabilization, monitor HR Restrictions Weight Bearing Restrictions: No  Therapy/Group: Individual Therapy Martin Majestic PT, DPT   13-Sep-2020, 7:15 AM

## 2020-09-27 NOTE — Anesthesia Procedure Notes (Signed)
Procedure Name: Intubation Date/Time: 09/19/2020 1:40 PM Performed by: Alease Medina, CRNA Pre-anesthesia Checklist: Patient identified, Emergency Drugs available, Suction available and Patient being monitored Patient Re-evaluated:Patient Re-evaluated prior to induction Oxygen Delivery Method: Circle system utilized Preoxygenation: Pre-oxygenation with 100% oxygen Induction Type: IV induction Ventilation: Mask ventilation without difficulty Laryngoscope Size: Glidescope and 3 Grade View: Grade I Tube type: Oral Tube size: 7.5 mm Number of attempts: 1 Airway Equipment and Method: Video-laryngoscopy and Rigid stylet Placement Confirmation: ETT inserted through vocal cords under direct vision,  positive ETCO2 and breath sounds checked- equal and bilateral Secured at: 20 cm Tube secured with: Tape Dental Injury: Teeth and Oropharynx as per pre-operative assessment

## 2020-09-27 NOTE — Significant Event (Addendum)
Rapid Response Event Note   Reason for Call :  Chest pain  Initial Focused Assessment:  VS: T 97.5, BP 145/121, HR 74, RR 26, SpO2 96% on room air Primary RN gave 0.4mg  SL nitro.    Pt lying in bed. Pale, diaphoretic. She is grimacing in pain, she does not provide a pain rating. Heart rate is fast, irregular.  BP rechecked upon my arrival: 40/16 500cc IVF bolus started. Rehab PA at bedside, cardiology consulted. BP improved to 80/50. HR remained 120-130 bpm, irregular. 2LNC applied.   Dr. [name] arrived to bedside and decision made to activate a code STEMI. Pt BP decline to a SBP of 60, order received for Levophed gtt and pharmacy notified. Pt noted to become bradycardic, PEA. Code blue initiated.   Interventions:  -EKG -500 cc bolus -2nd PIV -pt placed on continuous telemetry  Event Summary:  MD Notified: D. Angiulli, PA Call Time: 1230 Arrival Time: 1235 End Time: 1430  Jennye Moccasin, RN

## 2020-09-27 NOTE — Progress Notes (Addendum)
Patient ID: Deborah [name], female   DOB: 1951-04-01, 70 y.o.   MRN: [id number]  I was called by the rehab MD due to severe chest pain beginning around 12:15 or so this afternoon.  Patient has a history of atypical chest pain and has had pain, sometimes reproducible by palpation, on and off during the couple of weeks she has been in the hospital with her cerebellar hemorrhage. However, pain this afternoon was much worse.  ECG was done, showing ST elevation V1 and V2 with reciprocal ST depression inferiorly and laterally.  This was concerning for acute MI.  Patient had been given NTG prior to my arrival with a significant drop in her BP.  When I arrived, BP had improved with a fluid bolus.  She was in obvious distress due to chest pain.  Just after I arrived, patient went into PEA arrest.  This resolved with 1 round of CPR and 1 dose of epinephrine.  She was started on norepinephrine.  Code STEMI activated.  I spoke with Dr. 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   We were preparing the patient to go down to the cath lab when she went asystolic.  She received multiple rounds of epinephrine and HCO3 and was intubated.  She never regained a rhythm.  After a second code blue of about 20 minutes, patient expired.  Family were updated and at bedside during this time.   CRITICAL CARE Performed by: Marca Ancona  Total critical care time: 45 minutes  Critical care time was exclusive of separately billable procedures and treating other patients.  Critical care was necessary to treat or prevent imminent or life-threatening deterioration.  Critical care was time spent personally by me on the following activities: development of treatment plan with patient and/or surrogate as well as nursing, discussions with consultants, evaluation of patient's response to treatment, examination of patient, obtaining history  from patient or surrogate, ordering and performing treatments and interventions, ordering and review of laboratory studies, ordering and review of radiographic studies, pulse oximetry and re-evaluation of patient's condition.  Marca Ancona 09/14/2020 3:46 PM

## 2020-09-27 NOTE — Progress Notes (Incomplete)
Family remains at bedside, support continue

## 2020-09-27 NOTE — Progress Notes (Signed)
Physical Therapy Session Note  Patient Details  Name: Deborah [name] MRN: [id number] Date of Birth: Sep 01, 1950  Today's Date: 09-20-20 PT Individual Time: 1100-1155 PT Individual Time Calculation (min): 55 min   Short Term Goals: Week 1:  PT Short Term Goal 1 (Week 1): Pt will perform supine<>sit with min assist consistently (not using bed features) PT Short Term Goal 2 (Week 1): Pt will perform sit<>stands using LRAD with CGA PT Short Term Goal 3 (Week 1): Pt will perform bed<>chair transfers using LRAD with CGA PT Short Term Goal 4 (Week 1): Pt will ambulate at least 72ft using LRAD with CGA PT Short Term Goal 5 (Week 1): Pt will ascend/descend 4 steps using HRs with CGA  Skilled Therapeutic Interventions/Progress Updates:    Pt greeted supine in bed, daughter at bedside, pt agreeable to therapy session. No complaints of pain but reporting need to void. Supine<>sit with CGA with bed features. Sit<>stand with CGA from EOB to RW and she ambulated to bathroom with CGA and RW with assist for managing IV pole. Pt requesting privacy during toileting and daughter assisted with pericare and dressing. She ambulated to bedroom sink afterwards with CGA and RW and performed hand hygiene in standing with CGA. Transported in w/c to main therapy gym for time management and energy conservation.  Wheeled inside // bars to focus on general strengthening. She performed the following standing there-ex with BUE support on // bar and CGA for steadying: -1x10 hip abduction, bilateral -1x10 hip marches, bilateral -1x10 hamstring curls, bilateral -1x10 heel raises, bilateral -1x10 mini-squats *Used mirror for visual feedback to assist with sequencing, technique. She required frequent sitting rest breaks to complete the sets due to generalized fatigue.  She then performed unsupported side stepping L<>R along length of // bars, requiring minA for steadying and x1 LOB posteriorly while doing this which she was  able to self correct. Focused on taking large steps and increasing foot clearance. Gait training x27ft with CGA and RW, cues for distant visual stabilization, increasing step length/height, and depressing shoulders. Pt reporting fatigue and requesting to return to her room. She was wheeled back to her room with totalA for energy conservation and remained seated in w/c at end of session with daughter at bedside and needs within reach.   Therapy Documentation Precautions:  Precautions Precautions: Fall,Other (comment) Precaution Comments: dizziness/nausea with mobility, try using gaze stabilization, monitor HR Restrictions Weight Bearing Restrictions: No  Therapy/Group: Individual Therapy  Abbott Jasinski P Raahil Ong PT 09-20-2020, 7:42 AM

## 2020-09-27 NOTE — Discharge Summary (Addendum)
Physician Discharge Summary  Patient ID: Deborah [name] MRN: [id number] DOB/AGE: [birth year] 70 y.o.  Admit date: 09/09/2020 Discharge date: 2020-09-20  Discharge Diagnoses:  Principal Problem:   Cerebellar stroke [facility]) Active Problems:   Intraparenchymal hemorrhage of brain (HCC)   Sleep disturbance   AKI (acute kidney injury) (HCC)   Benign essential HTN   Thrombocytopenia (HCC)   Hyponatremia   Atrial fibrillation (HCC)   Atypical chest pain   Discharged Condition: Guarded  Significant Diagnostic Studies: CT ANGIO HEAD W OR WO CONTRAST  Result Date: 08/25/2020 CLINICAL DATA:  Stroke/TIA, assess intracranial arteries EXAM: CT ANGIOGRAPHY HEAD AND NECK TECHNIQUE: Multidetector CT imaging of the head and neck was performed using the standard protocol during bolus administration of intravenous contrast. Multiplanar CT image reconstructions and MIPs were obtained to evaluate the vascular anatomy. Carotid stenosis measurements (when applicable) are obtained utilizing NASCET criteria, using the distal internal carotid diameter as the denominator. CONTRAST:  53mL OMNIPAQUE IOHEXOL 350 MG/ML SOLN COMPARISON:  08/24/2020 and prior.  Concurrent noncontrast head CT. FINDINGS: CTA NECK FINDINGS Aortic arch: Standard branching. Imaged portion shows no evidence of aneurysm or dissection. No significant stenosis of the major arch vessel origins. Minimal atherosclerotic calcifications. Right carotid system: No evidence of dissection, stenosis (50% or greater) or occlusion. Left carotid system: No evidence of dissection, stenosis (50% or greater) or occlusion. Vertebral arteries: Dominant right vertebral artery. No evidence of dissection, stenosis (50% or greater) or occlusion. Skeleton: No acute or suspicious osseous abnormalities. Post sternotomy sequela. Other neck: No adenopathy.  No soft tissue mass. Upper chest: Upper lung atelectasis.  No acute finding. Review of the MIP images confirms the above  findings CTA HEAD FINDINGS Anterior circulation: No significant stenosis, proximal occlusion, aneurysm, or vascular malformation. Bilateral carotid siphon atherosclerotic calcifications. Posterior circulation: No significant stenosis, proximal occlusion, aneurysm, or vascular malformation. Venous sinuses: As permitted by contrast timing, patent. Anatomic variants: None. Review of the MIP images confirms the above findings IMPRESSION: No large vessel occlusion, dissection or aneurysm. No high-grade narrowing of the major intracranial or neck vessels. Cerebellar vermis hemorrhage is better evaluated on concurrent head CT. Electronically Signed   By: Stana Bunting M.D.   On: 08/25/2020 09:30   CT HEAD WO CONTRAST  Result Date: 09/04/2020 CLINICAL DATA:  Follow-up examination for acute stroke. EXAM: CT HEAD WITHOUT CONTRAST TECHNIQUE: Contiguous axial images were obtained from the base of the skull through the vertex without intravenous contrast. COMPARISON:  Prior head CT from 08/31/2020 FINDINGS: Brain: Continued interval evolution of parenchymal hemorrhage centered at the cerebellar vermis, overall slightly decreased in size and less dense as compared to previous exam. Similar to perhaps slightly decreased surrounding edema. Adjacent fourth ventricle remains widely patent. No hydrocephalus. No other new intracranial hemorrhage or large vessel territory infarct. Chronic microvascular ischemic disease again noted. No midline shift or extra-axial fluid collection. Vascular: No hyperdense vessel. Scattered vascular calcifications noted within the carotid siphons. Skull: Scalp soft tissues and calvarium are stable in appearance. No new finding. Sinuses/Orbits: Globes and orbital soft tissues demonstrate no acute finding. Paranasal sinuses and mastoid air cells remain clear. Other: None. IMPRESSION: 1. Continued interval evolution of parenchymal hemorrhage centered at the cerebellar vermis, overall slightly  decreased in size and less dense as compared to previous exam. Slightly decreased surrounding edema. No hydrocephalus. 2. No other new acute intracranial abnormality. Electronically Signed   By: Rise Mu M.D.   On: 09/04/2020 02:08   CT HEAD WO CONTRAST  Result Date:  08/31/2020 CLINICAL DATA:  Follow-up examination for acute stroke. EXAM: CT HEAD WITHOUT CONTRAST TECHNIQUE: Contiguous axial images were obtained from the base of the skull through the vertex without intravenous contrast. COMPARISON:  Prior CT from 08/29/2020. FINDINGS: Brain: Previously identified hemorrhage centered at the central cerebellar vermis is not significantly changed in size, measuring 1.8 x 2.0 x 1.6 cm. Hemorrhage itself is slightly less dense as compared to previous exam. Similar surrounding vasogenic edema without significant regional mass effect. Adjacent fourth ventricle remains widely patent. No hydrocephalus. No other new intracranial hemorrhage or large vessel territory infarct. Chronic microvascular ischemic disease again noted. No midline shift or extra-axial fluid collection. Vascular: No hyperdense vessel. Scattered vascular calcifications noted within the carotid siphons. Skull: Scalp soft tissues and calvarium are stable in appearance with no new finding. Sinuses/Orbits: Globes and orbital soft tissues demonstrate no acute finding. Paranasal sinuses remain clear. No mastoid effusion. Other: None. IMPRESSION: 1. No significant interval change in size of intraparenchymal hemorrhage centered at the cerebellar vermis. Similar surrounding mild edema without significant regional mass effect. 2. No other new acute intracranial abnormality. Electronically Signed   By: Rise Mu M.D.   On: 08/31/2020 04:46   CT HEAD WO CONTRAST  Result Date: 08/29/2020 CLINICAL DATA:  Intracranial hemorrhage follow up EXAM: CT HEAD WITHOUT CONTRAST TECHNIQUE: Contiguous axial images were obtained from the base of the skull  through the vertex without intravenous contrast. COMPARISON:  08/25/2020 FINDINGS: Brain: Unchanged focus of hemorrhage in the cerebellar vermis. There is periventricular hypoattenuation compatible with chronic microvascular disease. No midline shift or hydrocephalus. No significant mass effect. Vascular: No abnormal hyperdensity of the major intracranial arteries or dural venous sinuses. No intracranial atherosclerosis. Skull: The visualized skull base, calvarium and extracranial soft tissues are normal. Sinuses/Orbits: No fluid levels or advanced mucosal thickening of the visualized paranasal sinuses. No mastoid or middle ear effusion. The orbits are normal. IMPRESSION: Unchanged focus of hemorrhage in the cerebellar vermis. Electronically Signed   By: Deatra Robinson M.D.   On: 08/29/2020 03:53   CT HEAD WO CONTRAST  Result Date: 08/25/2020 CLINICAL DATA:  Suspected cerebral hemorrhage EXAM: CT HEAD WITHOUT CONTRAST TECHNIQUE: Contiguous axial images were obtained from the base of the skull through the vertex without intravenous contrast. COMPARISON:  CT head 08/25/2020.  MRI brain 08/24/2020 FINDINGS: Brain: Focal acute hemorrhage in the region of the cerebellar vermis measuring 2.2 cm in diameter. No significant change in appearance since prior study. No new hemorrhage is identified. Examination is otherwise unchanged. Vascular: Moderate intracranial arterial vascular calcifications. Skull: Calvarium appears intact. Sinuses/Orbits: Paranasal sinuses and mastoid air cells are clear. Other: None. IMPRESSION: Focal acute hemorrhage in the region of the cerebellar vermis measuring 2.2 cm in diameter. No significant change in appearance since prior study. No new hemorrhage is identified. Electronically Signed   By: Burman Nieves M.D.   On: 08/25/2020 20:43   CT HEAD WO CONTRAST  Result Date: 08/25/2020 CLINICAL DATA:  Cerebral hemorrhage EXAM: CT HEAD WITHOUT CONTRAST TECHNIQUE: Contiguous axial images  were obtained from the base of the skull through the vertex without intravenous contrast. COMPARISON:  August 24, 2020 head CT and brain MRI FINDINGS: Brain: Focal hemorrhage in the midline cerebellar region measures 2.0 x 1.5 x 1.6 cm, stable by remeasurement, with slight surrounding edema, stable. There is no new focal hemorrhage. Slight impression on the posterior aspect of the fourth ventricle is stable. There is no fourth ventricular effacement. Elsewhere there is mild diffuse atrophy, stable.  No extra-axial fluid collection or midline shift. Mild small vessel disease in the centra semiovale bilaterally is stable. Vascular: No hyperdense vessel evident. Calcification noted in each carotid siphon region. Skull: Bony calvarium appears intact. Sinuses/Orbits: Visualized paranasal sinuses are clear. Orbits appear symmetric bilaterally. Other: Mastoid air cells are clear. IMPRESSION: Stable hemorrhage posterior to the fourth ventricle in the mid cerebellar region with slight impression on the posterior aspect of the fourth ventricle. No ventricular effacement. No new hemorrhage. Elsewhere there is mild atrophy with mild periventricular small vessel disease. No new focus of decreased attenuation evident. Foci of arterial vascular calcification noted. Electronically Signed   By: Bretta BangWilliam  Woodruff III M.D.   On: 08/25/2020 09:14   CT Head Wo Contrast  Result Date: 08/24/2020 CLINICAL DATA:  Mental status change EXAM: CT HEAD WITHOUT CONTRAST TECHNIQUE: Contiguous axial images were obtained from the base of the skull through the vertex without intravenous contrast. COMPARISON:  None. FINDINGS: Brain: Rounded intraparenchymal hemorrhage seen within the inter cerebellar hemisphere measuring 1.6 x 1.4 x 1.8 cm. There is mild surrounding mass effect upon the fourth ventricle. No downward herniation is seen. No extra-axial collections or other intraparenchymal hemorrhage is seen. There is mild dilatation the  ventricles and sulci consistent with age-related atrophy. Low-attenuation changes in the deep white matter consistent with small vessel ischemia. Vascular: No hyperdense vessel or unexpected calcification. Skull: The skull is intact. No fracture or focal lesion identified. Sinuses/Orbits: The visualized paranasal sinuses and mastoid air cells are clear. The orbits and globes intact. Other: None IMPRESSION: Focal intraparenchymal hemorrhage within the inter cerebellar hemisphere with mass effect upon the fourth ventricle. No downward herniation. No extra-axial collections. Findings consistent with mild age related atrophy and chronic small vessel ischemia These results were called by telephone at the time of interpretation on 08/24/2020 at 8:56 pm to provider ADAM CURATOLO , who verbally acknowledged these results. Electronically Signed   By: Jonna ClarkBindu  Avutu M.D.   On: 08/24/2020 20:56   CT ANGIO NECK W OR WO CONTRAST  Result Date: 08/25/2020 CLINICAL DATA:  Stroke/TIA, assess intracranial arteries EXAM: CT ANGIOGRAPHY HEAD AND NECK TECHNIQUE: Multidetector CT imaging of the head and neck was performed using the standard protocol during bolus administration of intravenous contrast. Multiplanar CT image reconstructions and MIPs were obtained to evaluate the vascular anatomy. Carotid stenosis measurements (when applicable) are obtained utilizing NASCET criteria, using the distal internal carotid diameter as the denominator. CONTRAST:  75mL OMNIPAQUE IOHEXOL 350 MG/ML SOLN COMPARISON:  08/24/2020 and prior.  Concurrent noncontrast head CT. FINDINGS: CTA NECK FINDINGS Aortic arch: Standard branching. Imaged portion shows no evidence of aneurysm or dissection. No significant stenosis of the major arch vessel origins. Minimal atherosclerotic calcifications. Right carotid system: No evidence of dissection, stenosis (50% or greater) or occlusion. Left carotid system: No evidence of dissection, stenosis (50% or greater) or  occlusion. Vertebral arteries: Dominant right vertebral artery. No evidence of dissection, stenosis (50% or greater) or occlusion. Skeleton: No acute or suspicious osseous abnormalities. Post sternotomy sequela. Other neck: No adenopathy.  No soft tissue mass. Upper chest: Upper lung atelectasis.  No acute finding. Review of the MIP images confirms the above findings CTA HEAD FINDINGS Anterior circulation: No significant stenosis, proximal occlusion, aneurysm, or vascular malformation. Bilateral carotid siphon atherosclerotic calcifications. Posterior circulation: No significant stenosis, proximal occlusion, aneurysm, or vascular malformation. Venous sinuses: As permitted by contrast timing, patent. Anatomic variants: None. Review of the MIP images confirms the above findings IMPRESSION: No large vessel occlusion,  dissection or aneurysm. No high-grade narrowing of the major intracranial or neck vessels. Cerebellar vermis hemorrhage is better evaluated on concurrent head CT. Electronically Signed   By: Stana Bunting M.D.   On: 08/25/2020 09:30   MR BRAIN W WO CONTRAST  Result Date: 08/24/2020 CLINICAL DATA:  Acute neurologic deficit EXAM: MRI HEAD WITHOUT AND WITH CONTRAST TECHNIQUE: Multiplanar, multiecho pulse sequences of the brain and surrounding structures were obtained without and with intravenous contrast. CONTRAST:  6.30mL GADAVIST GADOBUTROL 1 MMOL/ML IV SOLN COMPARISON:  Head CT 08/24/2020 FINDINGS: Brain: No acute infarct, mass effect or extra-axial collection. Unchanged appearance of hemorrhage within the cerebellar vermis. There is multifocal hyperintense T2-weighted signal within the white matter. Parenchymal volume and CSF spaces are normal. There is no abnormal contrast enhancement. Vascular: Major flow voids are preserved. Skull and upper cervical spine: Normal calvarium and skull base. Visualized upper cervical spine and soft tissues are normal. Sinuses/Orbits:No paranasal sinus fluid  levels or advanced mucosal thickening. No mastoid or middle ear effusion. Normal orbits. IMPRESSION: 1. Unchanged appearance of cerebellar vermis hemorrhage. 2. No acute infarct or enhancing mass lesion. Electronically Signed   By: Deatra Robinson M.D.   On: 08/24/2020 23:01   CT ABDOMEN PELVIS W CONTRAST  Result Date: 08/24/2020 CLINICAL DATA:  Epigastric abdominal pain. EXAM: CT ABDOMEN AND PELVIS WITH CONTRAST TECHNIQUE: Multidetector CT imaging of the abdomen and pelvis was performed using the standard protocol following bolus administration of intravenous contrast. CONTRAST:  OMNIPAQUE IOHEXOL 300 MG/ML  SOLN COMPARISON:  None. FINDINGS: Lower chest: No acute abnormality. Hepatobiliary: No focal liver abnormality is seen. No gallstones, gallbladder wall thickening, or biliary dilatation. Pancreas: Unremarkable. No pancreatic ductal dilatation or surrounding inflammatory changes. Spleen: Normal in size without focal abnormality. Adrenals/Urinary Tract: Adrenal glands are unremarkable. Kidneys are normal, without renal calculi, focal lesion, or hydronephrosis. Bladder is unremarkable. Stomach/Bowel: Stomach appears normal. There is no evidence of bowel obstruction or inflammation. The appendix appears normal. Large amount of stool is seen in the sigmoid colon and rectum. Vascular/Lymphatic: Aortic atherosclerosis. No enlarged abdominal or pelvic lymph nodes. Reproductive: Uterus and bilateral adnexa are unremarkable. Other: No abdominal wall hernia or abnormality. No abdominopelvic ascites. Musculoskeletal: No acute or significant osseous findings. IMPRESSION: 1. Aortic atherosclerosis. 2. Large amount of stool is seen in the sigmoid colon and rectum. 3. No other abnormality seen in the abdomen or pelvis. Aortic Atherosclerosis (ICD10-I70.0). Electronically Signed   By: Lupita Raider M.D.   On: 08/24/2020 20:53   DG Chest Port 1 View  Result Date: 08/28/2020 CLINICAL DATA:  Fluid overload EXAM:  PORTABLE CHEST 1 VIEW COMPARISON:  08/26/2020 chest radiograph. FINDINGS: Intact sternotomy wires. Stable cardiomediastinal silhouette with moderate cardiomegaly. No pneumothorax. No significant pleural effusions. No pulmonary edema. Previously visualized nodular upper left perihilar opacity has resolved. No acute consolidative airspace disease. IMPRESSION: Stable moderate cardiomegaly without pulmonary edema. No active pulmonary disease. Electronically Signed   By: Delbert Phenix M.D.   On: 08/28/2020 08:54   DG CHEST PORT 1 VIEW  Result Date: 08/26/2020 CLINICAL DATA:  Cough and weakness in a 70 year old female EXAM: PORTABLE CHEST 1 VIEW COMPARISON:  August 25, 2020 FINDINGS: Post median sternotomy. Epicardial pacer wires project over the heart as before. Cardiomediastinal contours with marked cardiac enlargement as on the prior study. Pulmonary vascular congestion. No lobar consolidative changes. Patchy basilar opacities in the retrocardiac region in particular. Nodular opacity in the LEFT upper chest nodule versus vessel on end. This measures approximately  1.3 cm. On limited assessment no acute skeletal process. IMPRESSION: 1. Marked cardiac enlargement with pulmonary vascular congestion. 2. Nodular opacity in the LEFT upper chest versus vessel on end. Follow-up PA and lateral chest is suggested to ensure resolution and or CT of the chest on follow-up after resolution of acute process. 3. No lobar consolidative changes. No overt pulmonary edema. 4. Question of LEFT lower lobe atelectasis, attention on follow-up. Electronically Signed   By: Donzetta Kohut M.D.   On: 08/26/2020 11:22   DG CHEST PORT 1 VIEW  Result Date: 08/25/2020 CLINICAL DATA:  Aspiration. EXAM: PORTABLE CHEST 1 VIEW COMPARISON:  08/24/2020, 07/15/2018 FINDINGS: No focal consolidation. No pleural effusion or pneumothorax. Stable cardiomegaly. Prior median sternotomy. No acute osseous abnormality. IMPRESSION: No acute cardiopulmonary  disease. Electronically Signed   By: Elige Ko   On: 08/25/2020 09:45   DG Chest Portable 1 View  Result Date: 08/24/2020 CLINICAL DATA:  Altered mental status. EXAM: PORTABLE CHEST 1 VIEW COMPARISON:  April 07, 2019. FINDINGS: Stable cardiomegaly. No pneumothorax or pleural effusion is noted. Sternotomy wires are noted. Lungs are clear. Bony thorax is unremarkable. IMPRESSION: No active disease. Electronically Signed   By: Lupita Raider M.D.   On: 08/24/2020 20:36   ECHOCARDIOGRAM COMPLETE  Result Date: 08/26/2020    ECHOCARDIOGRAM REPORT   Patient Name:   Deborah [name] [facility], [place] Date of Exam: 08/26/2020 Medical Rec #:  [id number]         Height:       59.0 in Accession #:    [id number]        Weight:       152.6 lb Date of Birth:  05/21/1951          BSA:          1.644 m Patient Age:    69 years          BP:           119/64 mmHg Patient Gender: F                 HR:           88 bpm. Exam Location:  Inpatient Procedure: 2D Echo, Cardiac Doppler and Color Doppler Indications:    CVA  History:        Patient has prior history of Echocardiogram examinations, most                 recent 05/16/2020. CHF, Stroke, Mitral Valve Disease and TV                 repair, MVR; Arrythmias:Atrial Fibrillation.  Sonographer:    Lavenia Atlas Referring Phys: [id number] JINDONG XU IMPRESSIONS  1. Left ventricular ejection fraction, by estimation, is 55 to 60%. The left ventricle has normal function. The left ventricle has no regional wall motion abnormalities. There is mild left ventricular hypertrophy. Left ventricular diastolic parameters are indeterminate.  2. Right ventricular systolic function is mildly reduced. The right ventricular size is normal. There is moderately elevated pulmonary artery systolic pressure. The estimated right ventricular systolic pressure is 45.0 mmHg.  3. Left atrial size was severely dilated.  4. Right atrial size was severely dilated.  5. 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 Mean gradient 9  mmHg but PHT 76 msec (short). Suspect that she does not have hemodynamically significant mitral mitral stenosis and there does not appear to be significant mitral regurgitation.  6. Status post tricuspid valve repair. Mean gradient 6 mmHg  with mild-moderate tricuspid regurgitation.  7. The aortic valve is tricuspid. Aortic valve regurgitation is mild. Mild aortic valve sclerosis is present, with no evidence of aortic valve stenosis.  8. The inferior vena cava is dilated in size with <50% respiratory variability, suggesting right atrial pressure of 15 mmHg.  9. The patient is in atrial fibrillation. FINDINGS  Left Ventricle: Left ventricular ejection fraction, by estimation, is 55 to 60%. The left ventricle has normal function. The left ventricle has no regional wall motion abnormalities. The left ventricular internal cavity size was normal in size. There is  mild left ventricular hypertrophy. Left ventricular diastolic parameters are indeterminate. Right Ventricle: The right ventricular size is normal. No increase in right ventricular wall thickness. Right ventricular systolic function is mildly reduced. There is moderately elevated pulmonary artery systolic pressure. The tricuspid regurgitant velocity is 2.74 m/s, and with an assumed right atrial pressure of 15 mmHg, the estimated right ventricular systolic pressure is 45.0 mmHg. Left Atrium: Left atrial size was severely dilated. Right Atrium: Right atrial size was severely dilated. Pericardium: There is no evidence of pericardial effusion. 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 Mean gradient 9 mmHg but PHT 76 msec (short). Suspect that she does not have hemodynamically significant mitral mitral stenosis and there does not appear to be significant mitral regurgitation. The mitral  valve is normal in structure. Trivial mitral valve regurgitation. MV peak gradient, 25.4 mmHg. The mean mitral valve gradient is 9.0 mmHg. Tricuspid Valve: Status post  tricuspid valve repair. Mean gradient 6 mmHg with mild-moderate tricuspid regurgitation. The tricuspid valve is has been repaired/replaced. Tricuspid valve regurgitation is mild to moderate. Aortic Valve: The aortic valve is tricuspid. Aortic valve regurgitation is mild. Aortic regurgitation PHT measures 352 msec. Mild aortic valve sclerosis is present, with no evidence of aortic valve stenosis. Aortic valve peak gradient measures 13.0 mmHg. Pulmonic Valve: The pulmonic valve was normal in structure. Pulmonic valve regurgitation is not visualized. Aorta: The aortic root is normal in size and structure. Venous: The inferior vena cava is dilated in size with less than 50% respiratory variability, suggesting right atrial pressure of 15 mmHg. IAS/Shunts: No atrial level shunt detected by color flow Doppler.  LEFT VENTRICLE PLAX 2D LVIDd:         4.10 cm  Diastology LVIDs:         2.60 cm  LV e' medial:    6.31 cm/s LV PW:         1.20 cm  LV E/e' medial:  32.5 LV IVS:        1.20 cm  LV e' lateral:   6.53 cm/s LVOT diam:     1.90 cm  LV E/e' lateral: 31.4 LV SV:         43 LV SV Index:   26 LVOT Area:     2.84 cm  RIGHT VENTRICLE RV Basal diam:  3.30 cm RV S prime:     4.79 cm/s TAPSE (M-mode): 2.9 cm LEFT ATRIUM              Index       RIGHT ATRIUM           Index LA diam:        6.20 cm  3.77 cm/m  RA Area:     25.90 cm LA Vol (A2C):   89.0 ml  54.14 ml/m RA Volume:   81.80 ml  49.76 ml/m LA Vol (A4C):   122.0 ml 74.22 ml/m LA  Biplane Vol: 110.0 ml 66.92 ml/m  AORTIC VALVE AV Area (Vmax): 1.12 cm AV Vmax:        180.00 cm/s AV Peak Grad:   13.0 mmHg LVOT Vmax:      71.30 cm/s LVOT Vmean:     44.600 cm/s LVOT VTI:       0.150 m AI PHT:         352 msec  AORTA Ao Root diam: 2.70 cm MITRAL VALVE                TRICUSPID VALVE MV Area (PHT): 3.91 cm     TR Peak grad:   30.0 mmHg MV Peak grad:  25.4 mmHg    TR Vmax:        274.00 cm/s MV Mean grad:  9.0 mmHg MV Vmax:       2.52 m/s     SHUNTS MV Vmean:      132.0  cm/s   Systemic VTI:  0.15 m MV Decel Time: 194 msec     Systemic Diam: 1.90 cm MV E velocity: 205.00 cm/s Marca Ancona MD Electronically signed by Marca Ancona MD Signature Date/Time: 08/26/2020/12:35:27 PM    Final     Labs:  Basic Metabolic Panel: Recent Labs  Lab 08/31/20 1049 09/01/20 0239 2020-09-11 0131 09/04/20 0805 09/05/20 0616 09/19/2020 0522  NA 134* 134* 134* 133* 135 134*  K 3.6 4.0 4.8 3.5 4.5 4.4  CL 99 96* 99 99 102 102  CO2 23 24 23  21* 20* 21*  GLUCOSE 217* 179* 193* 265* 170* 180*  BUN 38* 42* 44* 40* 40* 36*  CREATININE 1.34* 1.40* 1.67* 1.64* 1.74* 1.54*  CALCIUM 10.4* 11.0* 10.8* 9.7 9.8 9.7    CBC: Recent Labs  Lab 09/04/20 0520 09/05/20 0616 08/29/2020 0522  WBC 9.5 9.8 9.6  NEUTROABS  --  5.1  --   HGB 13.5 13.0 12.4  HCT 40.5 41.3 38.8  MCV 72.5* 74.1* 73.2*  PLT 152 155 145*    CBG: Recent Labs  Lab 09/05/20 1203 09/05/20 1659 09/05/20 2045 09/21/2020 0639 09/05/2020 1157  GLUCAP 193* 229* 268* 177* 173*    Brief HPI:   Deborah [name] is a 70 y.o. right-handed female with history of borderline diabetes mellitus chronic atrial fibrillation rheumatic heart disease status post mitral valve replacement tricuspid replacement maintained on chronic Coumadin hypertension hyperlipidemia obesity with BMI 30.81.  Patient lives with her family independent prior to admission.  Presented 08/24/2020 with headache nausea vomiting decreased mobility x2 days.  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  No downward herniation.  CT abdomen pelvis showed large amount of stool no other abnormality identified.  Chest x-ray showed marked cardiac enlargement with pulmonary vascular congestion.  Admission chemistries hemoglobin 11 blood cultures no growth lactic acid 2.9 INR 2.1 troponin negative.  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  Cardiology service continue to follow for cardiac history.  Follow-up cranial CT scan 08/31/2020 relatively stable size of intraparenchymal hemorrhage as compared to prior tracings.  She was on Depakote for headaches.  Blood pressure monitored she did require Cleviprex.  Echocardiogram with ejection fraction of 55 to 60% no wall motion abnormalities.  Due to patient decrease in functional mobility she was admitted for a comprehensive rehab program.   Hospital Course: Deborah [name] was  admitted to rehab 09/04/2020 for inpatient therapies to consist of PT, ST and OT at least three hours five days a week. Past admission physiatrist, therapy team and rehab RN have worked together to provide customized collaborative inpatient rehab.  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  Follow-up cranial CT scan 09/04/2020 showed continued interval evolution of parenchymal hemorrhage centered at the cerebellar vermis overall slightly decreased in size and less dense as compared to previous exam.Neurology consulted in regards to resuming Coumadin felt this could take place 09/07/2020 with a goal INR of 2.5-3.0.  Patient was participating with therapies.  Blood sugars monitored and noted hemoglobin A1c is 7.5 Amaryl had been resumed Glucophage currently on hold due to mild elevating creatinine and monitored.  Blood pressures with Imdur Demadex as well as metoprolol relatively controlled.  Crestor ongoing for hyperlipidemia.  On 09/05/2020 patient with nonspecific chest pain cardiology services was consulted with review of troponin 14-25-50 felt to be possibly related to demand ischemia and chest pain did subside.  On the afternoon of  09/13/2020 patient again with increasing chest pain and rapid response as well as cardiology services consulted.  Orders given to repeat troponin that came back at 87.  EKG completed again showing atrial fibrillation concern for acute MI.  Patient had been given nitroglycerin significant drop in blood pressure orders for fluid bolus.  Cardiology service did show up to bedside and was planning for cardiac catheterization for work-up of chest pain however patient stopped breathing/PEA arrest code blue was called/CPR initiated and 1 dose of epinephrine.  She was started on norepinephrine. with cardiology services at bedside and they did regain a pulse and low pressure and plan was to go to cardiac Cath Lab for possible stenting however patient coded again pulse was not regained and code was terminated   Blood pressures were monitored on TID basis and soft and monitored  Diabetes has been monitored with ac/hs CBG checks and SSI was use prn for tighter BS control.    Rehab course: During patient's stay in rehab weekly team conferences were held to monitor patient's progress, set goals and discuss barriers to discharge. At admission, patient required minimal assist 9 feet rolling walker moderate assist supine to sit mod assist sit to supine  He/She  has had improvement in activity tolerance, balance, postural control as well as ability to compensate for deficits. He/She has had improvement in functional use RUE/LUE  and RLE/LLE as well as improvement in awareness.  Therapy sessions focused on functional mobility transfers generalized strengthening ambulating 10 feet x 2 rolling walker contact-guard assist increasing ambulation up to 80 feet with rolling walker contact-guard assist rest breaks as needed monitoring of oxygen saturations.       Disposition: Deceased  Discharge Instructions    Ambulatory referral to Neurology   Complete by: As directed    An appointment is requested in approximately 4  weeks intraparenchymal hemorrhage       Follow-up Information    Marcello FennelPatel, Ankit Anil, MD Follow up.   Specialty: Physical Medicine and Rehabilitation Why: Office to call for appointment Contact information: [address] [place] [address] [phone]        Laurey MoraleMcLean, Dalton S, MD Follow up.   Specialty: Cardiology Why: Call for appointment Contact information: [address] [place] [id number]  [phone]               Signed: Mcarthur Rossetti Saiya Crist 10-Sep-2020, 1:45 PM

## 2020-09-27 NOTE — Progress Notes (Addendum)
At 1230, this RN was called into patient's room that patient is having chest pain, upon arrival patient was grimacing in pain,. When asked patient complained of left chest pain, assessment done, [facility] and PA called, Nitro 0.4mg  given, vitals 145/121, HR 74, temp 97.5, Spo2 96%. Both [facility] and PA came in the room together and were giving orders and trying to get hold on the cardiology. Shortly after the cardiologist arrived patient coded, CPR started and code blue activated. The code was resolved. Patient coded again while trying to take her to cath lab. Another code blue activated again, multiple CPR done, with meds given but patient did not make it.

## 2020-09-27 DEATH — deceased

## 2020-10-11 ENCOUNTER — Encounter (HOSPITAL_COMMUNITY): Payer: Medicare Other | Admitting: Cardiology
